# Patient Record
Sex: Female | Born: 1940 | Race: White | Hispanic: No | Marital: Married | Smoking: Former smoker
Health system: Southern US, Community
[De-identification: ages and names within clinical notes are randomized; demographics above are authoritative.]

## PROBLEM LIST (undated history)

## (undated) DIAGNOSIS — C8593 Non-Hodgkin lymphoma, unspecified, intra-abdominal lymph nodes: Secondary | ICD-10-CM

## (undated) DIAGNOSIS — G9340 Encephalopathy, unspecified: Secondary | ICD-10-CM

## (undated) DIAGNOSIS — Z9221 Personal history of antineoplastic chemotherapy: Secondary | ICD-10-CM

## (undated) DIAGNOSIS — E785 Hyperlipidemia, unspecified: Secondary | ICD-10-CM

## (undated) DIAGNOSIS — T148XXA Other injury of unspecified body region, initial encounter: Secondary | ICD-10-CM

## (undated) DIAGNOSIS — J45909 Unspecified asthma, uncomplicated: Secondary | ICD-10-CM

## (undated) DIAGNOSIS — I499 Cardiac arrhythmia, unspecified: Secondary | ICD-10-CM

## (undated) DIAGNOSIS — I10 Essential (primary) hypertension: Secondary | ICD-10-CM

## (undated) DIAGNOSIS — C8330 Diffuse large B-cell lymphoma, unspecified site: Secondary | ICD-10-CM

## (undated) DIAGNOSIS — M109 Gout, unspecified: Secondary | ICD-10-CM

## (undated) DIAGNOSIS — I2510 Atherosclerotic heart disease of native coronary artery without angina pectoris: Secondary | ICD-10-CM

## (undated) DIAGNOSIS — Z923 Personal history of irradiation: Secondary | ICD-10-CM

## (undated) DIAGNOSIS — N390 Urinary tract infection, site not specified: Secondary | ICD-10-CM

## (undated) DIAGNOSIS — A419 Sepsis, unspecified organism: Secondary | ICD-10-CM

## (undated) DIAGNOSIS — E039 Hypothyroidism, unspecified: Secondary | ICD-10-CM

## (undated) DIAGNOSIS — B952 Enterococcus as the cause of diseases classified elsewhere: Secondary | ICD-10-CM

## (undated) DIAGNOSIS — Z950 Presence of cardiac pacemaker: Secondary | ICD-10-CM

## (undated) DIAGNOSIS — M1990 Unspecified osteoarthritis, unspecified site: Secondary | ICD-10-CM

## (undated) DIAGNOSIS — I495 Sick sinus syndrome: Secondary | ICD-10-CM

## (undated) DIAGNOSIS — C801 Malignant (primary) neoplasm, unspecified: Secondary | ICD-10-CM

## (undated) DIAGNOSIS — Z8679 Personal history of other diseases of the circulatory system: Secondary | ICD-10-CM

## (undated) DIAGNOSIS — D61810 Antineoplastic chemotherapy induced pancytopenia: Secondary | ICD-10-CM

## (undated) DIAGNOSIS — Z95828 Presence of other vascular implants and grafts: Secondary | ICD-10-CM

## (undated) DIAGNOSIS — H269 Unspecified cataract: Secondary | ICD-10-CM

## (undated) DIAGNOSIS — N179 Acute kidney failure, unspecified: Secondary | ICD-10-CM

## (undated) DIAGNOSIS — J449 Chronic obstructive pulmonary disease, unspecified: Secondary | ICD-10-CM

## (undated) DIAGNOSIS — D709 Neutropenia, unspecified: Secondary | ICD-10-CM

## (undated) DIAGNOSIS — C8590 Non-Hodgkin lymphoma, unspecified, unspecified site: Secondary | ICD-10-CM

## (undated) HISTORY — DX: Hyperlipidemia, unspecified: E78.5

## (undated) HISTORY — DX: Chronic obstructive pulmonary disease, unspecified: J44.9

## (undated) HISTORY — PX: ABDOMINAL HYSTERECTOMY: SHX81

## (undated) HISTORY — DX: Personal history of irradiation: Z92.3

## (undated) HISTORY — DX: Diffuse large B-cell lymphoma, unspecified site: C83.30

## (undated) HISTORY — DX: Unspecified asthma, uncomplicated: J45.909

## (undated) HISTORY — DX: Essential (primary) hypertension: I10

## (undated) HISTORY — PX: TUBAL LIGATION: SHX77

## (undated) HISTORY — PX: PACEMAKER INSERTION: SHX728

## (undated) HISTORY — DX: Other injury of unspecified body region, initial encounter: T14.8XXA

## (undated) HISTORY — PX: INSERT / REPLACE / REMOVE PACEMAKER: SUR710

## (undated) HISTORY — DX: Atherosclerotic heart disease of native coronary artery without angina pectoris: I25.10

## (undated) HISTORY — DX: Sick sinus syndrome: I49.5

## (undated) HISTORY — PX: BLADDER SUSPENSION: SHX72

## (undated) HISTORY — DX: Gout, unspecified: M10.9

## (undated) HISTORY — DX: Non-hodgkin lymphoma, unspecified, intra-abdominal lymph nodes: C85.93

## (undated) HISTORY — DX: Non-Hodgkin lymphoma, unspecified, unspecified site: C85.90

## (undated) HISTORY — PX: CHOLECYSTECTOMY: SHX55

---

## 1997-08-15 ENCOUNTER — Emergency Department (HOSPITAL_COMMUNITY): Admission: EM | Admit: 1997-08-15 | Discharge: 1997-08-15 | Payer: Self-pay | Admitting: Emergency Medicine

## 1999-02-25 ENCOUNTER — Other Ambulatory Visit: Admission: RE | Admit: 1999-02-25 | Discharge: 1999-02-25 | Payer: Self-pay | Admitting: Obstetrics & Gynecology

## 2001-01-26 ENCOUNTER — Other Ambulatory Visit: Admission: RE | Admit: 2001-01-26 | Discharge: 2001-01-26 | Payer: Self-pay | Admitting: Obstetrics & Gynecology

## 2002-03-20 ENCOUNTER — Other Ambulatory Visit: Admission: RE | Admit: 2002-03-20 | Discharge: 2002-03-20 | Payer: Self-pay | Admitting: Obstetrics & Gynecology

## 2003-08-06 ENCOUNTER — Observation Stay (HOSPITAL_COMMUNITY): Admission: RE | Admit: 2003-08-06 | Discharge: 2003-08-06 | Payer: Self-pay | Admitting: Obstetrics & Gynecology

## 2003-09-21 ENCOUNTER — Encounter (INDEPENDENT_AMBULATORY_CARE_PROVIDER_SITE_OTHER): Payer: Self-pay | Admitting: Specialist

## 2003-09-21 ENCOUNTER — Inpatient Hospital Stay (HOSPITAL_COMMUNITY): Admission: RE | Admit: 2003-09-21 | Discharge: 2003-09-24 | Payer: Self-pay | Admitting: Obstetrics & Gynecology

## 2003-11-15 ENCOUNTER — Encounter: Admission: RE | Admit: 2003-11-15 | Discharge: 2003-11-15 | Payer: Self-pay | Admitting: Endocrinology

## 2003-12-14 ENCOUNTER — Encounter (INDEPENDENT_AMBULATORY_CARE_PROVIDER_SITE_OTHER): Payer: Self-pay | Admitting: *Deleted

## 2003-12-14 ENCOUNTER — Observation Stay (HOSPITAL_COMMUNITY): Admission: RE | Admit: 2003-12-14 | Discharge: 2003-12-15 | Payer: Self-pay

## 2004-05-18 ENCOUNTER — Emergency Department (HOSPITAL_COMMUNITY): Admission: EM | Admit: 2004-05-18 | Discharge: 2004-05-18 | Payer: Self-pay | Admitting: Emergency Medicine

## 2004-08-08 ENCOUNTER — Encounter: Admission: RE | Admit: 2004-08-08 | Discharge: 2004-08-08 | Payer: Self-pay | Admitting: Endocrinology

## 2004-10-26 ENCOUNTER — Emergency Department (HOSPITAL_COMMUNITY): Admission: EM | Admit: 2004-10-26 | Discharge: 2004-10-26 | Payer: Self-pay | Admitting: *Deleted

## 2005-08-12 ENCOUNTER — Ambulatory Visit: Payer: Self-pay | Admitting: Gastroenterology

## 2005-08-19 ENCOUNTER — Ambulatory Visit: Payer: Self-pay | Admitting: Gastroenterology

## 2006-07-29 ENCOUNTER — Ambulatory Visit (HOSPITAL_COMMUNITY): Admission: RE | Admit: 2006-07-29 | Discharge: 2006-07-29 | Payer: Self-pay | Admitting: Internal Medicine

## 2006-07-30 ENCOUNTER — Inpatient Hospital Stay (HOSPITAL_COMMUNITY): Admission: EM | Admit: 2006-07-30 | Discharge: 2006-08-09 | Payer: Self-pay | Admitting: Emergency Medicine

## 2006-08-24 ENCOUNTER — Inpatient Hospital Stay (HOSPITAL_COMMUNITY): Admission: AD | Admit: 2006-08-24 | Discharge: 2006-09-04 | Payer: Self-pay | Admitting: Cardiology

## 2006-08-25 ENCOUNTER — Ambulatory Visit: Payer: Self-pay | Admitting: Internal Medicine

## 2007-01-11 ENCOUNTER — Ambulatory Visit (HOSPITAL_COMMUNITY): Admission: RE | Admit: 2007-01-11 | Discharge: 2007-01-11 | Payer: Self-pay | Admitting: Internal Medicine

## 2007-08-29 ENCOUNTER — Ambulatory Visit (HOSPITAL_COMMUNITY): Admission: RE | Admit: 2007-08-29 | Discharge: 2007-08-29 | Payer: Self-pay | Admitting: Internal Medicine

## 2009-05-02 ENCOUNTER — Encounter: Payer: Self-pay | Admitting: Internal Medicine

## 2009-07-16 ENCOUNTER — Ambulatory Visit (HOSPITAL_COMMUNITY): Admission: RE | Admit: 2009-07-16 | Discharge: 2009-07-16 | Payer: Self-pay | Admitting: Internal Medicine

## 2009-12-10 ENCOUNTER — Encounter: Payer: Self-pay | Admitting: Internal Medicine

## 2010-01-27 ENCOUNTER — Ambulatory Visit: Payer: Self-pay | Admitting: Internal Medicine

## 2010-01-27 DIAGNOSIS — F17200 Nicotine dependence, unspecified, uncomplicated: Secondary | ICD-10-CM

## 2010-01-27 DIAGNOSIS — Z950 Presence of cardiac pacemaker: Secondary | ICD-10-CM

## 2010-01-27 DIAGNOSIS — I4891 Unspecified atrial fibrillation: Secondary | ICD-10-CM

## 2010-01-27 DIAGNOSIS — I10 Essential (primary) hypertension: Secondary | ICD-10-CM | POA: Insufficient documentation

## 2010-01-27 DIAGNOSIS — I495 Sick sinus syndrome: Secondary | ICD-10-CM | POA: Insufficient documentation

## 2010-02-04 ENCOUNTER — Telehealth: Payer: Self-pay | Admitting: Internal Medicine

## 2010-05-12 ENCOUNTER — Encounter: Payer: Self-pay | Admitting: Internal Medicine

## 2010-05-15 NOTE — Assessment & Plan Note (Signed)
Summary: nep/a-fib   Visit Type:  Follow-up Primary Ishmail [name]:  amy stevenson   History of Present Illness: [name] is referred today by Dr. Aleen Campi for ongoing evaluation and treatment of atrial fibrillation, sinus node dysfunction s/p PPM, and dyslipidemia and morbid obesity.  The patients hx dates back to 2008 when she was admitted with bradycardia and underwent PPM insertion.  She ultimately had to have a new device placed several weeks later secondary to pocket infection.  The patient has been stable since.  She has palpitations occaisionally but no syncope.  She is on chronic coumadin for her atrial fib.  She admits to dietary indiscretion and has continued to smoke cigarettes.  Current Medications (verified): 1)  Hydrochlorothiazide 25 Mg Tabs (Hydrochlorothiazide) .... Take One Tablet By Mouth Daily. 2)  Lipitor 20 Mg Tabs (Atorvastatin Calcium) .... Take One Tablet By Mouth Daily. 3)  Warfarin Sodium 6 Mg Tabs (Warfarin Sodium) .... Use As Directed By Anticoagulation Clinic 4)  Diltiazem Hcl Er Beads 180 Mg Xr24h-Cap (Diltiazem Hcl Er Beads) .... Take One Capsule By Mouth Daily 5)  Lisinopril 10 Mg Tabs (Lisinopril) .... Take One Tablet By Mouth Bid 6)  Levothroid 100 Mcg Tabs (Levothyroxine Sodium) .... Once Daily 7)  Citalopram Hydrobromide 20 Mg Tabs (Citalopram Hydrobromide) .... Once Daily 8)  Metformin Hcl 500 Mg Tabs (Metformin Hcl) .... 2 Tablets  Two Times A Day 9)  Magnesium 250 Mg Tabs (Magnesium) .... Once Daily  Allergies (verified): 1)  ! Codeine  Past History:  Past Medical History: Last updated: 2010-02-05 . Sick sinus syndrome.    Hematoma at the site of the pacemaker insertion.    Chronic atrial fibrillation.    Pacemaker lead revision completed August 04, 2006.   Hypotension.   . Dyslipidemia.      Family History: Last updated: 2010-02-05  Mother died at age 59 of a stroke.  Her father died at   age 77, also with a stroke.  She had a sister who  died earlier this year   at age 72.  Her sister had a permanent pacemaker.  She has a son, now 48   years old, had a brain aneurysm at age 2, and after rupture, has had   marked disability sense.   Social History: Last updated: 2010-02-05 The patient has worked two jobs for many years.  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   Review of Systems       All systems reviewed and negative except for bilateral knee pain.  Vital Signs:  Patient profile:   70 year old female Height:      56 inches Weight:      242 pounds BMI:     54.45 Pulse rate:   70 / minute BP sitting:   98 / 70  (left arm)  Vitals Entered By: Laurance Flatten CMA (January 27, 2010 9:27 AM)  Physical Exam  General:  Obese, well developed, well nourished, in no acute distress.  HEENT: normal Neck: supple. No JVD. Carotids 2+ bilaterally no bruits Cor: RRR no rubs, gallops or murmur Lungs: CTA. Well healed PPM incision. Ab: Obese, soft, nontender. nondistended. No HSM. Good bowel sounds Ext: warm. no cyanosis, clubbing or edema Neuro: alert and oriented. Grossly nonfocal. affect pleasant    PPM Specifications Following MD:  Lewayne Bunting, MD     Referring MD:  Charolette Child, MD PPM Vendor:  [facility] Jude     PPM Model Number:  5826     PPM Serial Number:  [id number] PPM DOI:  08/02/2006     PPM Implanting MD:  Charolette Child, MD  Lead 1    Location: RA     DOI: 08/02/2006     Model #: 1642T     Serial #: [id number]     Status: active Lead 2    Location: RV     DOI: 08/02/2006     Model #: 1646T     Serial #: [id number]     Status: active  Magnet Response Rate:  BOL 98.6 ERI 86.3  Indications:  Sick sinus syndrome   PPM Follow Up Remote Check?  No Battery Voltage:  2.79 V     Battery Est. Longevity:  10 years      Pacer Dependent:  No       PPM Device Measurements Atrium  Amplitude: 4.5 mV, Impedance: 378 ohms, Threshold: 0.5 V at 0.4 msec Right Ventricle  Amplitude: 8.8 mV, Impedance: 591 ohms,  Threshold: 0.625 V at 0.4 msec  Episodes MS Episodes:  148     Percent Mode Switch:  14%     Coumadin:  Yes  Parameters Mode:  DDD     Lower Rate Limit:  70     Upper Rate Limit:  110 Paced AV Delay:  250     Sensed AV Delay:  200 Next Cardiology Appt Due:  07/13/2010 Tech Comments:  RA reprogrammed 2.0@0 .4.  148 mode switch episodes the longest >1 day, + coumadin.  There were a small % of elevated ventricular rates during A-fib.  We will start TTM's with Mednet every 6 months and she will return in 6 months to the clinic. Altha Harm, LPN  January 27, 2010 10:05 AM  MD Comments:  Normal device function.  Impression & Recommendations:  Problem # 1:  CARDIAC PACEMAKER IN SITU (ICD-V45.01) Her device is working normally.  Will recheck in several months.  Problem # 2:  ATRIAL FIBRILLATION (ICD-427.31) She is out of rhythm just over 10% of the time but has minimal symptoms.  Continue meds as below and cardizem. Her updated medication list for this problem includes:    Warfarin Sodium 6 Mg Tabs (Warfarin sodium) ..... Use as directed by anticoagulation clinic  Problem # 3:  ESSENTIAL HYPERTENSION, BENIGN (ICD-401.1) Her blood pressure is well controlled.  I discussed the importance of weight loss. Her updated medication list for this problem includes:    Hydrochlorothiazide 25 Mg Tabs (Hydrochlorothiazide) .Marland Kitchen... Take one tablet by mouth daily.    Diltiazem Hcl Er Beads 180 Mg Xr24h-cap (Diltiazem hcl er beads) .Marland Kitchen... Take one capsule by mouth daily    Lisinopril 10 Mg Tabs (Lisinopril) .Marland Kitchen... Take one tablet by mouth bid  Problem # 4:  TOBACCO ABUSE (ICD-305.1) I discussed the importance of smoking cessation.  She states that she will try and stop smoking.  Patient Instructions: 1)  Your physician recommends that you continue on your current medications as directed. Please refer to the Current Medication list given to you today. 2)  Your physician wants you to follow-up in: 6 months  You  will receive a reminder letter in the mail two months in advance. If you don't receive a letter, please call our office to schedule the follow-up appointment.

## 2010-05-15 NOTE — Progress Notes (Signed)
Summary: c/o pain between breast bone  Phone Note Call from Patient Call back at Home Phone [phone]   Caller: Patient Reason for Call: Talk to Nurse Details for Reason: c/o when she take a deep breath, pain between breast bone.  Initial call taken by: Lorne Skeens,  February 04, 2010 12:18 PM  Follow-up for Phone Call        pt was not at home.  she is going to call me back when she returns home Dennis Bast, RN, BSN  February 04, 2010 2:27 PM pt called back and she is fine now.  This happened after she got out of the shower and it would hurt worse with inspiration.  She said it is better now.  she sat down and it all has gone away.  She has been going about her normal activity.  will call back if comes back or go to the ER if necessary Dennis Bast, RN, BSN  February 04, 2010 3:00 PM

## 2010-05-15 NOTE — Cardiovascular Report (Signed)
Summary: Office Visit   Office Visit   Imported By: Roderic Ovens 01/30/2010 13:33:11  _____________________________________________________________________  External Attachment:    Type:   Image     Comment:   External Document

## 2010-05-15 NOTE — Letter (Signed)
Summary: [facility]   Imported By: Marylou Mccoy 02/10/2010 08:15:40  _____________________________________________________________________  External Attachment:    Type:   Image     Comment:   External Document

## 2010-05-15 NOTE — Letter (Signed)
Summary: [facility]  [facility]   Imported By: Marylou Mccoy 02/10/2010 08:43:11  _____________________________________________________________________  External Attachment:    Type:   Image     Comment:   External Document

## 2010-05-21 NOTE — Miscellaneous (Signed)
Summary: corrected device information  Clinical Lists Changes  Observations: Added new observation of PPMLEADSER2: [id number] (05/12/2010 19:31) Added new observation of PPMLEADMOD2: 1788TC (05/12/2010 19:31) Added new observation of PPMLEADDOI2: 09/02/2006 (05/12/2010 19:31) Added new observation of PPMLEADSER1: [id number] (05/12/2010 19:31) Added new observation of PPMLEADMOD1: 1699TC (05/12/2010 19:31) Added new observation of PPMLEADDOI1: 09/02/2006 (05/12/2010 19:31) Added new observation of PPM DOI: 09/02/2006 (05/12/2010 19:31) Added new observation of PPM SERL#: [id number]  (05/12/2010 19:31)      PPM Specifications Following MD:  Lewayne Bunting, MD     Referring MD:  Charolette Child, MD PPM Vendor:  [facility] Jude     PPM Model Number:  5826     PPM Serial Number:  [id number] PPM DOI:  09/02/2006     PPM Implanting MD:  Charolette Child, MD  Lead 1    Location: RA     DOI: 09/02/2006     Model #: [id number]     Serial #: [id number]     Status: active Lead 2    Location: RV     DOI: 09/02/2006     Model #: [id number]     Serial #: [id number]     Status: active  Magnet Response Rate:  BOL 98.6 ERI 86.3  Indications:  Sick sinus syndrome   PPM Follow Up Pacer Dependent:  No      Episodes Coumadin:  Yes  Parameters Mode:  DDD     Lower Rate Limit:  70     Upper Rate Limit:  110 Paced AV Delay:  250     Sensed AV Delay:  200

## 2010-07-30 ENCOUNTER — Encounter: Payer: Self-pay | Admitting: Internal Medicine

## 2010-07-31 ENCOUNTER — Encounter: Payer: Self-pay | Admitting: Internal Medicine

## 2010-08-26 NOTE — Cardiovascular Report (Signed)
[name], [name]               ACCOUNT NO.:  [id number]   MEDICAL RECORD NO.:  [id number]          PATIENT TYPE:  INP   LOCATION:  2807                         FACILITY:  [facility]   PHYSICIAN:  Dana Char, MD    DATE OF BIRTH:  Feb 20, 1941   DATE OF PROCEDURE:  09/02/2006  DATE OF DISCHARGE:                            CARDIAC CATHETERIZATION   NOTE:  Pacemaker note.   PROCEDURE:  Insertion of dual-chamber permanent transvenous pacemaker  DDD mode.   INDICATIONS FOR PROCEDURE:  This 70 year old female was scheduled for  reinsertion of a pacemaker system today by way of her left subclavian  vein.  She has a history of sick sinus syndrome with syncope with  tachybrady syndrome and initially had a pacemaker insertion on  April21,2008.  She had moderate bleeding problems during the  procedure ans problems with hemostasis of the wound and 24 hours later  she had marked swelling of the site and also lead dislodgement.  She  then had a repeat procedure on April23,2008 for pocket revision  and lead repositioning.  There was a marked hematoma within the pocket  at that time along with continued bleeding.  She had successful  repositioning of the leads and cleaning of the wound.  She was given 1  gram of Avitene in the wound and it was felt that her wound was stable  at that time.  She was discharged the following day with the wound  appearing stable.  However over the next week, she had progressive  swelling of the wound and she returned to our office with marked  swelling and showing signs of drainage from the wound.   The wound was cleaned and redressed and plans for pocket revision were  made.  She was scheduled for a repeat procedure and she was admitted on  [id number] for pocket revision.  On admission, the pocket wound  was not healed and there was purulent drainage.  She then underwent a  pacemaker removal procedure on 08/24/2006 with debridement of the wound  with old clot  removed and a thorough debridement of the wound and  cleaning of the edges.  The wound was then closed except for a Penrose  drain and she was continued on Ancef.   The following day we obtained an infectious disease consult and changed  the antibiotic and she has been on IV Kantamycin since that time with  the dosage controlled by the pharmacy.  The working diagnosis from  infectious disease is wound infection and she is now stable for  reinsertion on the opposite side.  Of note is that she did have a marked  brady arrhythmia the day following the explant with a severe brady  arrhythmia in the 20s, associated with near syncope while at rest and a  marked drop in her blood pressure.  This responded to atropine and she  has been monitored since on the intensive care unit.   PROCEDURE:  After signing an informed consent the patient was  premedicated with 5 mg of Valium by mouth and brought to the cardiac  catheterization lab at  [facility].  Her left anterior chest and  base of neck were prepped and draped in a sterile fashion and a left  transverse subclavicular plane was anesthetized locally using 1%  lidocaine with epinephrine.  An incision was made in this anesthetized  plane with the incision being deepened into the fascial layer overlying  the pectoralis muscle.  A pocket was then formed in this fascial layer  using the Bovie for hemostasis and hemostasis at this time was very good  with a combination of using the Bovie to form the pocket and using the  lidocaine with epinephrine.  We then inserted two #7 Cook introducer  sheaths into the left subclavian vein percutaneously and the Seldinger  wires were easily passed into the superior vena cava.  We then selected  two active fixation screw-in leads made by [facility]. [employer].  Primarily  the choice of active fixation lead was because of her lead dislodgement,  following the first procedure.  A ventricular lead was inserted  first.  It is a model tendril model number #1788TC, serial number [id number].  After proper preparation it was inserted through a 7-French Cook  introducer sheath and advanced to the superior vena cava.  The sheath  was removed in the usual fashion.  The atrial lead selected is an  Optisense made by [employer], model number [phone], serial number  [id number].  After proper preparation, it was inserted through the second  [facility] introducer sheath and advanced to the superior vena cava.  The  second sheath was removed in the usual fashion.   The ventricular lead was then advanced into the right atrium and right  ventricle where the tip was positioned in the apex of the right  ventricle.  Very good pacing parameters were obtained with a minimum  voltage threshold of 0.7 volts utilizing 0.9 mA of current.  The  resistance was measured at 925 ohms.  The R wave sensitivity measured  6.0 mV.  These parameters were obtained after actively fixed in the lead  with the screw-in device.  The atrial lead was then advanced into the  right atrium where it was positioned anteriorly, in the right atrial  appendage and after actively affixing the lead, we obtained very good  pacing parameters with a minimal voltage threshold of 1 volts utilizing  3 mA of current.  The resistance was measured at 454 ohms and the P-wave  sensitivity measured 3.2 mV.  After obtaining these pacing parameters,  the leads were secured properly at their insertion site.  The wound was  lavaged profusely with a Kantamycin solution.  We then selected a new  pacemaker pulse generator, made by [facility] model [place], model  number [id number], serial number [id number].  After properly analyzing, the  pulse generator was attached to the pacing electrodes in the usual  fashion.   The pulse generator was then placed within the previously formed pocket and the wound was closed in layers using 2-0 Vicryl.  Final skin closure  was  obtained with a cutaneous layer of Steri-Strips.  Very good  hemostasis was evident.  The patient tolerated the procedure well and no  complications were noted.   MEDICATIONS:  Medications given were Versed 2 mg IV times two, fentanyl  25 mcg IV.   NOTE:  The pacemaker was noted to be functioning normally in the DDD  mode.  Wound care instructions were given.      Dana Char, MD  Electronically Signed     JRT/MEDQ  D:  09/02/2006  T:  09/02/2006  Job:  [id number]

## 2010-08-26 NOTE — Cardiovascular Report (Signed)
[name], [name]               ACCOUNT NO.:  [id number]   MEDICAL RECORD NO.:  [id number]          PATIENT TYPE:  OIB   LOCATION:  2807                         FACILITY:  [facility]   PHYSICIAN:  [name], MD    DATE OF BIRTH:  August 01, 1940   DATE OF PROCEDURE:  08/24/2006  DATE OF DISCHARGE:                            CARDIAC CATHETERIZATION   PROCEDURE:  Pacemaker explantation with removal of pacing leads.   INDICATIONS FOR PROCEDURE:  This 70 year old female had a permanent  transvenous pacemaker inserted initially on August 02, 2006.  She had  marked bleeding in her wound and also had a lead displacement.  The  wound was re-operated on on April 23 with removal of the hematoma and  repositioning of both leads.  She had anticoagulation with Avitene in  the pocket during the procedure and was then discharged home the next  day with the pacing wound appearing to be stable.  However, when she  returned one week later the wound had bled significantly and she had  oozing from the wound. It was cleaned and redressed and after several  more days was apparent that the wound would not heal.  She was then  scheduled for removal of the pacer today.   PROCEDURE:  After signing an informed consent, the patient was  premedicated with 5 mg of Valium by mouth and brought to the cardiac  catheterization laboratory at [facility].  Her anterior chest  and base of neck and wound in the right anterior chest were anesthetized  locally with 1% lidocaine.  The wound was then opened and the pacing  pulse generator was removed from the pocket.  We then loosened the  pacing electrodes from their insertion site and they were easily  retracted and removed from their insertion site.  The wound was then  debrided and lavaged profusely with kanamycin solution and the edges of  the wound were trimmed.  We then left a Penrose placed in situ with the  end being exposed in the lateral margin of the  wound.  The wound was  then closed in layers using 2-0 Vicryl.  Final skin closure was obtained  with a cutaneous layer of Steri-Strips.  The patient tolerated the  procedure well and no complications were noted.  At the end of the  procedure a sterile bulky dressing was applied to the wound and she was  admitted to the hospital for further IV antibiotics and monitoring.   MEDICATIONS GIVEN DURING THE PROCEDURE:  Versed 2 mg IV twice.   Also of note that she had gone into atrial fibrillation after the first  procedure and we had scheduled her for elective cardioversion today  also.  However, last night she converted spontaneously to normal sinus  rhythm.  Therefore, the cardioversion was cancelled.   Plans at this point will be to continue the Ancef antibiotic  intravenously 1 g q.8h. until cultures and sensitivity are obtained from  the wound.  They were sent to pathology during the procedure.  We will  keep her off of Coumadin at  present  because of her conversion back to sinus rhythm, and also because  of her marked propensity to bleed.  Other plans at present are to give  her a least 1 week of antibiotic before planning to reinsert a new  pacing system on the left side.      [name], MD  Electronically Signed     JRT/MEDQ  D:  08/24/2006  T:  08/24/2006  Job:  [id number]   cc:   Cath Lab at [facility]

## 2010-08-26 NOTE — H&P (Signed)
NAMEKEYSHIA, ORWICK               ACCOUNT NO.:  [id number]   MEDICAL RECORD NO.:  [id number]          PATIENT TYPE:  OIB   LOCATION:  2807                         FACILITY:  [facility]   PHYSICIAN:  [name], MD    DATE OF BIRTH:  July 20, 1940   DATE OF ADMISSION:  08/24/2006  DATE OF DISCHARGE:                              HISTORY & PHYSICAL   REASON FOR ADMISSION:  This 70 year old female is being admitted after  removal of her pacemaker.  She initially presented to the hospital in  mid April with weakness, chest pain, diaphoresis and monitoring At that  time documented symptomatic bradycardia and short episodes of atrial  fibrillation.  She had a pacemaker insertion initially, with a dual-  chamber pacemaker on August 02, 2006, without problems.  However,  approximately 24 hours after the insertion, the patient was quite active  and telemetry showed malfunction of the pacer and chest x-ray documented  dislodgement of the pacing lead.  Also, she was showing marked swelling  of her pacemaker wound site.  She then had a repeat procedure on August 04, 2006, for pocket revision and lead repositioning.  There was a  marked hematoma within the pocket at that time, and there was continued  bleeding.  She had successful repositioning of the leads and cleaning of  the wound.  It was then felt to be stable after having wound  anticoagulation with 1 gram of Avitene, and the wound was sutured  routinely, and she was stable for discharge the following day with the  wound appearing to be stable.  However, after one week when she returned  to our office for follow up, the wound was markedly swollen and showed  signs of drainage from the wound.  The wound was cleaned and redressed,  and plans for pacer pocket revision were made.  She was scheduled for  that today.  Meanwhile, the drainage continued and with when seen  yesterday, there was obvious drainage from the wound, and we proceeded  with  scheduling for the pacemaker removal rather than pocket revision.  Also, she had gone into atrial fibrillation which persisted, and plans  were made for cardioversion today.   PAST MEDICAL HISTORY:  The patient has a history of intermittent atrial  fibrillation and was previously on placed on Coumadin.  She has a rather  negative medical history except for limited mobility due to a bad left  knee and has not been able to increase her activity level.  She has two  children alive and well, and has had one operation in the past having a  C-section with her second child.  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   FAMILY HISTORY:  Mother died at age 1 of a stroke.  Her father died at  age 81, also with a stroke.  She had a sister who died earlier this year  at age 53.  Her sister had a permanent pacemaker.  She has a son, now 41  years old, had a brain aneurysm at age 69, and after rupture, has  had  marked disability sense.   SOCIAL HISTORY:  The patient has worked two jobs for many years.  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   REVIEW OF SYSTEMS:  The patient smoked until 2004.  She has occasional  leg cramps.  She is followed by nose or sinus problems.  She has carpal  tunnel syndrome with numbness in her hands.   PHYSICAL EXAMINATION:  GENERAL;  The patient is overweight in  appearance.  VITAL SIGNS:  Blood pressure 110/70, pulse 60 and regular, respirations  nonlabored.  She was in no acute distress on admission.  She was well  oriented x3.  HEENT:  Ear, nose and throat clear.  External exam is normal.  NECK:  Supple.  Normal carotid pulses.  No jugular venous distension.  Thyroid not palpable.  CHEST:  Respirations are nonlabored.  Lungs were clear without rales,  rhonchi or wheezes.  CARDIOVASCULAR:  Regular rhythm.  Normal first and second heart sounds.  No murmur, gallop or click.  ABDOMEN:  Rotund, normal bowel sounds.  Nontender.   There is no  guarding.  EXTREMITIES:  Full range of motion, normal distal peripheral pulses.  SKIN:  Warm and dry.  There was no edema present.   IMPRESSION:  1. On admission, pacemaker wound infection secondary to a wound      hematoma and nonhealing.  2. Tachybrady syndrome.   DISPOSITION:  Pacemaker removal was scheduled for today with the patient  now in sinus rhythm.  Will not continue the Coumadin, and will cancel  the cardioversion.      [name], MD  Electronically Signed     JRT/MEDQ  D:  08/24/2006  T:  08/24/2006  Job:  [id number]

## 2010-08-29 ENCOUNTER — Ambulatory Visit (INDEPENDENT_AMBULATORY_CARE_PROVIDER_SITE_OTHER): Payer: Medicare Other | Admitting: Internal Medicine

## 2010-08-29 ENCOUNTER — Encounter: Payer: Self-pay | Admitting: Internal Medicine

## 2010-08-29 DIAGNOSIS — I10 Essential (primary) hypertension: Secondary | ICD-10-CM

## 2010-08-29 DIAGNOSIS — Z950 Presence of cardiac pacemaker: Secondary | ICD-10-CM

## 2010-08-29 DIAGNOSIS — I495 Sick sinus syndrome: Secondary | ICD-10-CM

## 2010-08-29 DIAGNOSIS — I4891 Unspecified atrial fibrillation: Secondary | ICD-10-CM

## 2010-08-29 NOTE — Discharge Summary (Signed)
NAME:  JOSSLIN, SANJUAN                   ACCOUNT NO.:  [id number]   MEDICAL RECORD NO.:  [id number]                   PATIENT TYPE:  INP   LOCATION:  9319                                 FACILITY:  [facility]   PHYSICIAN:  Ilda Mori, M.D.                DATE OF BIRTH:  30-Dec-1940   DATE OF ADMISSION:  09/21/2003  DATE OF DISCHARGE:  09/24/2003                                 DISCHARGE SUMMARY   FINAL DIAGNOSIS:  Pelvic prolapse.   SECONDARY DIAGNOSES:  1. Adult onset gestational diabetes mellitus.  2. Hypothyroidism.  3. Atrial fibrillation.  4. Mild chronic obstructive pulmonary disease.   PROCEDURES:  Transvaginal hysterectomy.  Anterior and posterior repair.   COMPLICATIONS:  None.   CONDITION ON DISCHARGE:  Stable.   HISTORY:  This is a 70 year old, gravida 2, para 2, with a long history of  pelvic relaxation.  The patient failed pessary for correction and requested  surgical repair.  The patient developed new onset atrial fibrillation  preoperatively on August 06, 2003, and her surgery was postponed at that  time.  She was evaluated by Dr. Aggie Cosier of cardiology, where she was  found to be back in regular sinus rhythm, and she seemed to have no  significant cardiac insufficiency.  It was therefore felt that she was  medically stable for surgery.   HOSPITAL COURSE:  The surgery was rescheduled, and she was admitted to the  hospital on September 21, 2003, where she was taken to the operating room and a  transvaginal hysterectomy was performed, with an anterior and posterior  repair, and colpoplasty for vaginal suspension was performed.  The patient  did well postoperatively.  Her Foley catheter was removed on the morning of  the first postoperative day.  Later in that day, she developed a temperature  to 101.4, and was started on IV antibiotics.  In addition, it was noted that  her voiding, which had been normal earlier in the day, was becoming more  frequent with  smaller amounts, and a urinary cath revealed 700 cc of  residual; therefore, the decision was made to place the Foley and rest the  bladder.  On the morning of the second postoperative day, her pulse, which  had been regular at 60-70 beats per minute, was found to be irregular in the  90-100 range.  A cardiogram was obtained, which confirmed that the patient  had lapsed once again into atrial fibrillation.  Cardiac consultation was  called, and the decision was made to increase her Cardizem, and to start her  on Coumadin.  On the morning of the third postoperative day, the patient had  remained afebrile 24 hours.  Her blood pressure was normal, and the patient  was ambulating well.  Her catheter was removed on the morning of the third  postoperative day, and on the afternoon of that day, she had been voiding  well.  At this time, she has  been reevaluated by cardiology, it was felt  that outpatient management was appropriate, and she was felt to be ready for  discharge from both a cardiac and a gynecologic perspective.   DIET:  She was discharged on a regular diet.   ACTIVITY:  She was told to limit her activities.   DISCHARGE MEDICATIONS:  1. She was given Coumadin 5 mg to take daily in the evening.  2. She was to continue her Synthroid 112 mcg daily.  3. She was also given an increased dose of Cardizem to 300 mg daily.   FOLLOW UP:  1. She will see her cardiologist in 1 week.  2. She will return for GYN postoperative evaluation in 2 weeks.   LABORATORY DATA:  A CK total of 340, CK-MB of 3.3, a relative index of 1.0.  Her troponin I was 0.02.  Glycosylated hemoglobin, hemoglobin A1C was 6.2.  A TSH was 0.109.  Electrolytes were normal, except for a glucose of 129.  An  INR was 1.1 on the morning after Coumadin had been started.  Her admission  hemoglobin was 14.5, with a white count of 8100.  Her discharge hemoglobin  was 11.6 with a white count of 9200.  Her urinalysis was negative  for  infection.  A chest x-ray was performed because of a cough, which showed  stable mild cardiomegaly.  EKG was obtained during her hospitalization,  which showed atrial fibrillation.  The pathology report is pending at the  time of this dictation.                                               Ilda Mori, M.D.    RK/MEDQ  D:  09/24/2003  T:  09/24/2003  Job:  [id number]   cc:   Jaclyn Prime. Lucas Mallow, M.D.  [address]  [place]  [place] [address]  Fax: [phone]   Brooke Bonito, M.D.  [address]  [place]  [place] [address]  Fax: [phone]

## 2010-08-29 NOTE — Discharge Summary (Signed)
[name], [name]               ACCOUNT NO.:  [id number]   MEDICAL RECORD NO.:  [id number]          PATIENT TYPE:  INP   LOCATION:  6526                         FACILITY:  [facility]   PHYSICIAN:  [name], MD    DATE OF BIRTH:  [birth year]   DATE OF ADMISSION:  08/24/2006  DATE OF DISCHARGE:  09/04/2006                               DISCHARGE SUMMARY   FINAL DIAGNOSES:  1. 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  2. Tachybrady syndrome.   OPERATIONS:  1. Pacemaker pulse generator and lead removal - Aug 24, 2006.  2. Insertion of new pacemaker pulse generator, dual-chamber - Sep 02, 2006.   INDICATION FOR ADMISSION:  This 70 year old female was readmitted for  removal of her pacemaker and pacemaker leads after having pacer  insertion on August 02, 2006, which had a late complication of pocket  hematoma and nonhealing.  This was revised initially on April 23 and  again had pacemaker pocket hematoma.  This showed signs of nonhealing a  week later and she was readmitted for removal and treatment of the  secondary pocket infection.   HOSPITAL COURSE:  On the day of admission, the patient's wound in her  right upper chest was opened and the pacemaker removed and the leads  were withdrawn from her right ventricle and right atrium.  A Penrose  drain was left in place.  The wound was debrided and flushed with  kanamycin solution.  The wound edges were trimmed and re-closed with the  drain in place.  And infectious disease consult was obtained by Dr.  Orvan Falconer and he recommended antibiotic treatments, which she received  until May 24.  The cultures from the wound were negative, and after 10  days of IV antibiotics she then had a new insertion of a permanent  transvenous pacemaker in the left anterior chest by way of the left  subclavian vein.  Hemostasis was easily obtained on this insertion and  there was no sign of hematoma or wound swelling or  discoloration  following this surgery.  The IV antibiotics were then continued for two  further days and being stable for discharge on Sep 04, 2006, she was  then discharged in improved stable condition with a new pacemaker in  place.   DISPOSITION ON DISCHARGE:  1. Wound care:  Wound care instructions were given to keep dry for 1      week.  2. Followup appointment:  For 1 week in the office to check her wound      and her pacemaker.  3. Diet:  Low sodium heart healthy.  4. Activity:  Increase activity slowly, may shower and bathe after 1      week.  No heavy lifting for 1 week.  No driving for 1 week.   MEDICATIONS:  1. Synthroid 0.100 daily.  2. Lisinopril 10 mg daily.  3. Cardizem LA 240 mg daily.  4. Coumadin 2.5 mg daily.  5. Lexapro 20 mg daily.  6. Lipitor 40 mg one-half tablet q.h.s.  7. Hydrocodone 5/500  p.r.n.  pain.   SPECIAL INSTRUCTIONS:  She was instructed to call my office for a  followup appointment and also for followup of her prothrombin time for  her Coumadin management.  Her Coumadin was restarted after the new  insertion of the pacemaker.   CONDITION ON DISCHARGE:  Improved and stable.      [name], MD  Electronically Signed     JRT/MEDQ  D:  09/30/2006  T:  09/30/2006  Job:  [id number]

## 2010-08-29 NOTE — Cardiovascular Report (Signed)
[name], [name]               ACCOUNT NO.:  [id number]   MEDICAL RECORD NO.:  [id number]          PATIENT TYPE:  INP   LOCATION:  3734                         FACILITY:  [facility]   PHYSICIAN:  [name], MD    DATE OF BIRTH:  June 21, 1940   DATE OF PROCEDURE:  08/04/2006  DATE OF DISCHARGE:                            CARDIAC CATHETERIZATION   SURGEON:  [name], MD   PROCEDURE:  1. Pacemaker lead repositioning.   INDICATIONS FOR PROCEDURE:  This 70 year old female had insertion of a  dual chamber permanent transvenous pacemaker on August 02, 2006 for  tachybrady syndrome and was scheduled for discharge yesterday.  Her  pacemaker was analyzed yesterday morning and was found to have normal  function with very good pacing parameters.  However, just prior to  discharge, the telemetry showed malfunction with atrial spikes in  noncapture and patient was noted to have moderate hyperactivity with  extending her arm over her head multiple times and a repeat chest x-ray  showed malposition of the atrial lead in the superior vena cava.  This  was changed from the chest x-ray done the night before.  She was then  scheduled for repositioning of the opposing lead today.   PROCEDURE IN DETAIL:  After signing an informed consent, the patient was  premedicated with 5 mg of Valium by mouth and brought to the cardiac  catheterization lab at [facility].  Her right anterior chest,  face and neck were prepped and draped in a sterile fashion and the wound  was anesthetized locally with 1% lidocaine.  The wound was reopened with  incision of the Vicryl sutures exposing the pulse generator which was  removed from the pocket.  The pulse generator was removed from the  leads.  The pacemaker was also noted to now be in atrial fibrillation.  We then analyzed both leads and found that repositioning of the  ventricular lead was also noted.  We repositioned the ventricular lead  first and  obtained very good pacing parameters with a minimum bolus  threshold of 0.4 volts, a resistance of 848 Ohms and an R wave  sensitivity of 10.9 millivolts.  We then repositioned the atrial lead  and obtained a site which the tip was caught and could not be dislodged  easily at this area.  We found P waves of 1.8 millivolts, a resistance  of 538 Ohms and we were unable to get a threshold because of the atrial  fibrillation.  After repositioning the leads, they were secured properly  at their insertion site using 2-0 silk sutures.  We then lavaged the  wound profusely with a kanamycin solution.  There was significant oozing  of the wound deep in the pocket and Avitene 1 gram was used to insert in  the pocket for hemostasis.  We then inserted the leads within the pulse  generator in the usual fashion and then pulse generator was returned to  the pocket.  The wound was closed in layers using 2-0 Vicryl.  Final  skin closure was obtained with a cutaneous layer of  Steri-Strips.  The  patient tolerated the procedure well and no complications were noted.  At the end of the procedure, a sterile bulky dressing was applied to the  wound and she was returned to her room in satisfactory condition.   MEDICATIONS GIVEN:  Versed 2 mg IV x2, Avitene 1 gram in the pacer  pocket.   The pacemaker is noted to be functioning normally in the DDD mode.  After review of the chart, we restarted her home medication of Cardizem  LA 240 mg daily and we will plan to stop the IV Cardizem which was  started at 3:30 this morning.  We will also plan to cardiovert when her  Coumadin level is therapeutic if she does not spontaneously cardiovert  before then.      [name], MD  Electronically Signed     JRT/MEDQ  D:  08/04/2006  T:  08/04/2006  Job:  [id number]   cc:   [facility]

## 2010-08-29 NOTE — Op Note (Signed)
NAME:  [name], [name]                   ACCOUNT NO.:  [id number]   MEDICAL RECORD NO.:  [id number]                   PATIENT TYPE:  INP   LOCATION:  9319                                 FACILITY:  [facility]   PHYSICIAN:  Ilda Mori, M.D.                DATE OF BIRTH:  1940/06/21   DATE OF PROCEDURE:  09/21/2003  DATE OF DISCHARGE:                                 OPERATIVE REPORT   PREOPERATIVE DIAGNOSIS:  Cervical and uterine prolapse with 2-3 degree  cystocele and rectocele.   POSTOPERATIVE DIAGNOSIS:  Cervical and uterine prolapse with 2-3 degree  cystocele and rectocele.   PROCEDURE:  Transvaginal hysterectomy, anterior and posterior repair.   SURGEON:  Ilda Mori, M.D.   ASSISTANT:  Randye Lobo, M.D.   ANESTHESIA:  General endotracheal anesthesia.   ESTIMATED BLOOD LOSS:  200 mL.   COMPLICATIONS:  None.   FINDINGS:  Fourth degree uterocervical prolapse and a third degree cystocele  and rectocele.  The ovaries could not be palpated or visualized.   INDICATIONS FOR PROCEDURE:  This is a 70 year old gravida 2, para 2, who  presented with marked pelvic prolapse.  The patient has lived with these  symptoms for over five years but they became more distressing to her and she  requested surgical repair.  A pessary was placed and this could not be  retained.  In addition, the patient was not anxious to use a pessary and  wanted surgical repair.   DESCRIPTION OF PROCEDURE:  The patient was taken to the operating room and  placed in the dorsal lithotomy position  so that she was comfortable prior  to anesthesia.  After she was placed in a comfortable position, general  endotracheal anesthesia was induced.  The vagina, perineum, and lower  abdomen were prepped and draped in a sterile fashion.  The cervix, which was  protruding through the introitus, was grasped with a tenaculum and the  paracervical tissues were infiltrated with a dilute epinephrine solution of  0.5%  Marcaine.  The vagina was circumcised and the vaginal mucosa was  dissected free.  The posterior cul-de-sac was entered sharply and the  uterosacral ligaments were clamped, cut, ligated, and held.  The base of the  cardinal ligaments were then grasped with a LigaSure clamp and cauterized.  The anterior cul-de-sac was then entered and the remainder of the cardinal  ligaments and the uterine arteries were clamped with a LigaSure clamp and  cauterized.  With traction on the cervix, the base of the broad ligament was  clamped, cauterized, and cut, and this dissection continued until the utero-  ovarian anastomosis was identified, clamped, cut, and ligated.  Hemostasis  was noted to be present.  The infundibulopelvic ligament could not be  identified.  The ovaries could not be identified.  The decision was made not  to proceed with bilateral oophorectomy.  After removal of the uterus and  cervix, the posterior cuff was  closed with a running interlocking #1 Vicryl  suture.  The cul-de-sac was then closed with a suture from uterosacral to  uterosacral and another culdoplasty stitch was made through the posterior  vagina coming out through the cul-de-sac and picking up the uterosacrals  laterally and back through the posterior vagina.  Neither of these stitches  were tied at this time.  The peritoneum was not closed.  The vagina was then  grasped anteriorly.  The anterior vaginal wall was incised and the bladder  was dissected free from the overlying vaginal mucosa.  The cystocele was  reduced and Kelly plications were performed to repair the cystocele.  The  vaginal mucosa was then closed with horizontal mattress sutures.  The  posterior repair was then performed.  The peritoneum was incised in a V-  shape at the introitus and a flap of skin was removed.  The vaginal mucosa  was then dissected free from the underlying rectocele.  With a finger in the  rectum to guide the surgeon, the lateral  portions of the rectocele were  defined and the muscular structures laterally were sutured across the  midline to repair the rectocele.  Following this, the vaginal mucosa was  trimmed free and was closed with horizontal mattress sutures.  At this  point, the cul-de-sac sutures were tied and this created good support for  the vaginal cuff.  The vagina was packed with gauze and Estrace cream.  The  bladder was catheterized with a Foley catheter and clear urine was obtained.  The procedure was terminated and the patient left the operating room in good  condition.                                               Ilda Mori, M.D.    RK/MEDQ  D:  09/21/2003  T:  09/21/2003  Job:  [id number]

## 2010-08-29 NOTE — H&P (Signed)
NAME:  [name], [name]                   ACCOUNT NO.:  [id number]   MEDICAL RECORD NO.:  [id number]                   PATIENT TYPE:  INP   LOCATION:  NA                                   FACILITY[facility]   PHYSICIAN:  Ilda Mori, M.D.                DATE OF BIRTH:  [birth year]   DATE OF ADMISSION:  09/20/2004  DATE OF DISCHARGE:                                HISTORY & PHYSICAL   CHIEF COMPLAINT:  Complete pelvic prolapse.   HISTORY OF PRESENT ILLNESS:  This is a 70 year old gravida 2, para 2, who  has been followed in the office for 10 years with pelvic relaxation.  The  patient's problems have been fairly minimal, despite the significant degree  of pelvic relaxation.  She denies any urinary incontinence and otherwise  functions normally; however, over the past several years, the prolapse has  become more pronounced with the cervix actually protruding through the  vaginal introitus.  In addition, the patient has developed some benign  microhematuria which has been evaluated and found to be totally benign;  however, she feels that it is possible that her prolapse is causing damage  to her kidneys and therefore, this has added a sense of urgency to her  concerns about getting this repaired.   PAST MEDICAL HISTORY:  1. She has a history of hypothyroidism for which she is currently on 112 mcg     of Synthroid replacement.  2. She has osteoarthritis which affects her hip, knee and shoulder joints.  3. She also has a history of gallstones.  4. She suffers from mild cardiomegaly and on August 06, 2003, when she was     scheduled for her surgery previously, atrial fibrillation was diagnosed.     Since that time, she has had an evaluation with Dr. Jaclyn Prime. Lucas Mallow and     she has been started on Cardizem LA 180 mg daily prior to that visit.     When she did see Dr. Lucas Mallow, she had regular sinus rhythm.  He did a     complete physical exam on her which showed no signs of significant  cardiac disease and she was in sinus rhythm at that time.  He felt that     her atrial fibrillation preoperatively in April may have been due to the     slightly higher dose of Synthroid (125 mcg) that she was taking at that     time, due to the beneficial effect of the Cardizem which she been started     on since the episode or simply a spontaneously recover.  5. The patient also has adult-onset diabetes mellitus for which she is on no     medication.  6. She has hyperlipidemia.  7. Mild anxiety.  8. In addition, she has a history of asthmatic bronchitis and mild COPD.   PREVIOUS SURGERIES:  Previous surgeries include:  1. A cesarean section.  2. Tubal  ligation.   MEDICATIONS:  Her medications are:  1. Synthroid 0.112 mg daily.  2. Lisinopril 10 mg.  3. Etodolac 400 mg daily.  4. Lorazepam 1 mg daily.  5. Cardizem LA 180 mg daily.   FAMILY HISTORY:  Family history of heart disease and high blood pressure.  Her mother and father both suffered from strokes.   ALLERGIES:  She claims to have a CODEINE allergy but the symptoms of  exposure are nausea.   SOCIAL HISTORY:  She does not smoke and does not abuse alcohol.   REVIEW OF SYSTEMS:  Review of systems is negative in detail.   PHYSICAL EXAM:  GENERAL:  She is 4-feet 11-inches tall, 242 pounds with a  calculated body mass index of 49.  VITAL SIGNS:  She is afebrile, blood pressure 130/80.  ENT:  Ears, nose and throat are normal.  NECK:  Thyroid gland was not palpably enlarged, although she does have a  history of a thyroid nodule.  LUNGS:  Her lungs were clear to auscultation and percussion.  HEART:  Heart was regular sinus rhythm without murmurs or gallops.  ABDOMEN:  Abdomen was obese without hepatosplenomegaly.  BREASTS:  Her breasts were without masses.  PELVIC:  Her pelvic exam revealed normal external genitalia, her cervix  protruding through the introitus.  She did not have a marked rectocele,  which appeared only 2+,  but she had a dramatic cystocele which was 3 to 4+.  Most of her defect appeared to be central at the site of the uterus and  cervix.   HOSPITAL IMPRESSION:  The patient has mildly symptomatic marked pelvic  relaxation.  The patient was tried and failed on a pessary which would not  stay in place.  She is also not anxious to pursue pessary as a long-term  treatment for this problem.  She has no signs of bladder dysfunction at this  time.  An operative plan is to do a vaginal hysterectomy and remove both  ovaries if possible; in addition, an anterior and posterior repair and  enterocele repair will be performed at the time of surgery.                                               Ilda Mori, M.D.    RK/MEDQ  D:  09/20/2003  T:  09/20/2003  Job:  [id number]

## 2010-08-29 NOTE — Discharge Summary (Signed)
NAMEMCKENZEY, [name]               ACCOUNT NO.:  [id number]   MEDICAL RECORD NO.:  [id number]          PATIENT TYPE:  INP   LOCATION:  3734                         FACILITY:  [facility]   PHYSICIAN:  Hillery Aldo, M.D.   DATE OF BIRTH:  [birth year]   DATE OF ADMISSION:  07/30/2006  DATE OF DISCHARGE:  08/03/2006                               DISCHARGE SUMMARY   PRIMARY CARE PHYSICIAN:  Is Dr. [name].   CARDIOLOGIST:  Is Dr. Aggie Cosier   DISCHARGE DIAGNOSES:  1. Sick sinus syndrome status post pacemaker implantation, [employer]      Medical device.  2. Chronic atrial fibrillation.  3. Hypotension with bradycardia.  4. Hypothyroidism.  5. Chronic anxiety disorder.  6. Diet-controlled diabetes.  7. Dyslipidemia  8. Ongoing tobacco abuse.   DISCHARGE MEDICATIONS:  1. Lovenox 110 mg subcutaneous injection b.i.d. until INR therapeutic.  2. Coumadin 5 mg daily.  3. Levothyroxine 100 mcg daily.  4. Lexapro 20 mg daily.  5. Lisinopril 10 mg daily.   Note, the patient's Cardizem and bisoprolol has been discontinued.  Consideration for restarting either of these medications can be made by  the patient's primary care physician or cardiologist as indicated.   CONSULTATIONS:  1. Dr. Lucas Mallow and Dr. Aleen Campi of cardiology.   PROCEDURES AND DIAGNOSTIC STUDIES.:  1. Chest X-Ray: On 07/30/2006 showed borderline heart size with no      acute cardiopulmonary disease.  2. Implantation of a permanent pacemaker on 08/02/2006.   DISCHARGE LABORATORY VALUES:  PT was 14.7, INR 1.1.  Sodium was 141,  potassium 3.8, chloride 106, bicarb 28, BUN 16, creatinine 0.88, glucose  117, free T4 was 1.14.  White blood cell count was 7.8, hemoglobin 14.4,  hematocrit 42.7, platelets 295.   HOSPITAL COURSE BY PROBLEM:  1. Sick sinus syndrome:  The patient was admitted with bradycardia and      hypotension.  Concern was that the patient had developed sick sinus      syndrome and her cardiologist was  consulted.  The patient was      evaluated by her cardiologist and plans were made to implant a      permanent pacemaker which was done as noted above.  The patient has      been asymptomatic throughout the course of her hospitalization and      her blood pressure has been stable.  She has not had any further      bradycardia off her usual dose of Cardizem and beta blocker      therapy.  At this point, her Cardizem and beta-blocker have been      completely discontinued and will not be restarted prior to      discharge due to soft blood pressure readings.  Her lisinopril was      restarted.  Consideration for re-adding these medications can be      done as an outpatient if her blood pressure can tolerate them.  2. Chronic atrial fibrillation:  The patient has a history of chronic      atrial fibrillation on Coumadin therapy.  Coumadin was held      secondary to plans for permanent pacemaker implantation.  She was      put on therapeutic dose Lovenox prior to her surgery.  Therapeutic      low dose Lovenox was restarted after pacemaker implantation and she      will be discharged on both this and Coumadin.  A follow-up      appointment has been set up for her to have her PT and INR checked      on Friday at Dr. Pollie Friar office.  He can then determine if she can      discontinue Lovenox. .  3. Hypothyroidism:  The patient's TSH was checked during the course of      her hospitalization and found to be slightly low.  Free T4 was      subsequently checked and found to be within normal limits.  No      further dosage adjustment was made to her Synthroid dose given her      normal free T4.  4. Anxiety disorder:  The patient continued on her usual dose of      Lexapro  5. Diabetes:  The patient's glycemic control was excellent on a      carbohydrate modified diet.  A hemoglobin A1c was checked and found      to be 7.1%.  .  6. Dyslipidemia:  The patient was continued on a low cholesterol diet.       Her total cholesterol was 107, triglycerides 150, HDL 30, and LDL      47.  7. Tobacco abuse:  The patient was strongly counseled in regard to      cessation of tobacco products.  She was maintained on a nicotine      patch to help craving.  She underwent formal tobacco cessation      counseling prior to discharge.   DISPOSITION:  The patient is stable for discharge home today.  She  should follow up for a repeat of a PT/INR on 08/06/2006 at Dr. Pollie Friar  office.  She should follow up with her primary care physician, Dr.  Elisabeth Most in 1-2 weeks.      Hillery Aldo, M.D.  Electronically Signed     CR/MEDQ  D:  08/03/2006  T:  08/03/2006  Job:  [id number]   cc:   Lovenia Kim, D.O.  Jaclyn Prime. Lucas Mallow, M.D.

## 2010-08-29 NOTE — Cardiovascular Report (Signed)
NAMELINDY, [name]               ACCOUNT NO.:  [id number]   MEDICAL RECORD NO.:  [id number]          PATIENT TYPE:  INP   LOCATION:  3734                         FACILITY:  [facility]   PHYSICIAN:  [name], MD    DATE OF BIRTH:  [birth year]   DATE OF PROCEDURE:  08/02/2006  DATE OF DISCHARGE:                            CARDIAC CATHETERIZATION   PROCEDURE:  Insertion of dual-chamber permanent transvenous pacemaker  DDD mode.   INDICATIONS FOR PROCEDURE:  A 70 year old female with syncope and  collapse and a Holter monitor documenting symptomatic bradyarrhythmia  and hospital-documented weakness with bradycardia and hypotension. She  also has had intermittent atrial fibrillation; therefore, fits the  criteria for tachybrady syndrome.   ANESTHESIA:  Local with 1% lidocaine.   PROCEDURE:  After signing an informed consent, the patient was  premedicated with 5 mg of Valium by mouth and brought to the cardiac  catheterization lab at [facility]. Her right anterior chest and  base of neck were prepped and draped in sterile fashion, and a right  transverse subclavicular plane was anesthetized locally with 1%  lidocaine.  An incision was made in this anesthetized plane with the  incision being deepened into the fascial layer overlying the pectoralis  muscle.  A pocket was then formed in this fascial layer for later  insertion of the pulse generator.  Two #7 Cook introducer sheaths were  inserted percutaneously into the right subclavian vein with the  Seldinger wires being easily passed into the superior vena cava. The  first lead selected was made by [facility]. [employer], a bipolar ventricular  lead, Model Number 1646 T, Serial Number [id number]. After proper  preparation, it was inserted through the [facility] introducer sheath and  advanced to the superior vena cava.  The sheath was removed in the usual  fashion.  A second lead was then selected for the atrium, a bipolar  atrial J  lead made by [employer], Model Number [id number], Serial  Number [id number]. After proper preparation, the atrial J lead was  inserted through the second [facility] introducer sheath and advanced to the  superior vena cava.  This sheath was removed in the usual fashion.  The  ventricular lead was then advanced into the right atrium and right  ventricle where the tip was positioned in the apex of the right  ventricle. Very good pacing parameters were obtained with an R wave  sensitivity of 10-12 mV, a resistance of 815 ohms, and a minimum voltage  threshold of 0.2 volts utilizing 0.2 mA of current.  After obtaining the  ventricular pacing thresholds, the atrial J lead was advanced into the  right atrium where the tip was positioned anteriorly in the right atrial  appendage.  Very good pacing parameters were obtained with a P-wave  sensitivity of 7.0 mV, a resistance of 664 ohms, and a minimal voltage  threshold of 0.5 volts, utilizing 0.7 mA of current. After obtaining  these pacing parameters, both leads were secured properly at their  insertion site.  The wound was lavaged profusely with kanamycin  solution.   We then selected a pulse generator made by [employer], Primera  model, Model Number [id number], Serial Number [id number].  After properly  analyzing the pulse generator, it was attached to both electrodes in the  usual fashion.  The pulse generator was then placed within the  previously formed pocket, and the wound was closed in layers using 2-0  Vicryl.  Final skin closure was obtained with a cutaneous layer of Steri-  Strips.  The patient tolerated the procedure well, and no complications  were noted.  At the end of the procedure, a sterile bulky dressing was  applied to the wound, and she was returned to her room in satisfactory  condition.   MEDICATIONS GIVEN:  Versed 2 mg IV for conscious sedation.   The pacemaker was noted to have normal sensing capture with normal  function in  the DDD mode.      [name], MD  Electronically Signed     JRT/MEDQ  D:  08/02/2006  T:  08/02/2006  Job:  [id number]   cc:   Catheterization Lab, [facility]

## 2010-08-29 NOTE — Patient Instructions (Signed)
Your physician wants you to follow-up in: 6 months in the device clinic and 12 months with Dr Taylor You will receive a reminder letter in the mail two months in advance. If you don't receive a letter, please call our office to schedule the follow-up appointment.  

## 2010-08-29 NOTE — Discharge Summary (Signed)
[name], [name]               ACCOUNT NO.:  [id number]   MEDICAL RECORD NO.:  [id number]          PATIENT TYPE:  INP   LOCATION:  3734                         FACILITY:  [facility]   PHYSICIAN:  Michaelyn Barter, M.D. DATE OF BIRTH:  11/25/1940   DATE OF ADMISSION:  07/30/2006  DATE OF DISCHARGE:  08/09/2006                               DISCHARGE SUMMARY   FINAL DIAGNOSES:  1. Sick sinus syndrome.  2. Hematoma at the site of the pacemaker insertion.  3. Chronic atrial fibrillation.  4. Pacemaker lead revision completed August 04, 2006.  5. Hypotension.  6. Dyslipidemia.   CONSULTATION:  Cardiology with Dr. Aleen Campi.   PROCEDURES:  1. His pacemaker lead repositioning completed August 04, 2006.  2. Insertion of dual chamber permanent transvenous pacemaker, DDD mode      completed August 02, 2006, by Dr. Aram Candela. Tysinger  3. Chest x-ray completed July 30, 2006, August 03, 2006, and August 05, 2006.   HISTORY OF PRESENT ILLNESS:  [name] is a 70 year old female who  stated that she attempted to ambulate to her bedroom and while she  attempted to stand, she developed sudden onset of dizziness accompanied  by severe nausea and left-sided chest pain.  She went her kitchen and  began to vomit.  She decided to come to the hospital for further  evaluation.  She stated that she had similar symptoms in the past  attributed to her history of arrhythmia.   PAST MEDICAL HISTORY:  Please see that dictated by Dr. Hillery Aldo.   HOSPITAL COURSE:  PROBLEM #1 - SICK SINUS SYNDROME:  While in the  emergency department, the patient's heart rate was noted to be 42.  There was a right bundle branch block noted on her EKG as well as some  nonspecific T-wave that abnormalities.  A chest x-ray was done on July 30, 2006, that revealed borderline heart size.  No acute cardiopulmonary  disease..  Cardiology was consulted and Dr. Aleen Campi performed the  insertion of a dual-chamber permanent  transvenous pacemaker, DDD mode,  on August 02, 2006.  The patient had no repeat episodes of syncope,  presyncope or orthostatic hypotension or sick sinus related symptoms  during the remaining portion of her hospitalization.   PROBLEM #2 - PACEMAKER LEAD REVISION:  On August 01, 2006, the patient  was noted to have had a 5.4 second pause, then she converted to normal  sinus rhythm, then she went back to atrial fibrillation.  External pacer  pads had to be placed during that time.  On August 03, 2006, plans were  made to discharge the patient from the hospital, however, she started  having strange hiccups.  The telemetry monitor revealed abnormal pacer  spikes.  A chest x-ray was completed on August 03, 2006, which revealed  the right subclavian dual lead pacemaker had been placed with leads in  the right atrium and right ventricle.  The lungs appear to be clear.  There was no heart failure.  A repeat chest x-ray revealed interval  dislodgement of the atrial lead  now folded on one end within the SVC.  On August 04, 2006, Dr. Aleen Campi performed a pacemaker lead revision  during which time he repositioned both the A and V leads.  It was also  noted at that time that the patient had converted back to atrial  fibrillation.   PROBLEM #3 - ATRIAL FIBRILLATION:  The patient does have a history of  chronic atrial fibrillation and it appears that during the course of her  hospitalization she had episodes whereby she would convert from normal  sinus rhythm to atrial fibrillation.  Cardiology continued to follow  this.   PROBLEM #4 - HYPOTENSION:  During the earlier portion of the patient's  hospitalization her blood pressure was noted to be slightly hypotensive,  however, by the last remaining days of her hospitalization this  condition resolved.   PROBLEM #5 - DYSLIPIDEMIA:  This was monitored over the course of her  hospitalization.   PROBLEM #6 -  HEMATOMA AT THE PACEMAKER INSERTION SITE:   Following the  patient's pacemaker insertion, she was noted to have developed a  significant amount of swelling over the area of her pacemaker.  Initially she complained of some pain, however, she had no fevers, her  white count remained relatively within normal limits.  The patient was  scheduled to be discharged from the hospital earlier, however, the  decision was made to keep the patient in the hospital for another day or  two just to continue to monitor the swelling.  Swelling remained  relatively stable and the patient's pain decreased.  Therefore the  decision was made to discharge the patient from the hospital.   On the day of discharge the patient's temperature was 98.2, heart rate  72, respirations 20, blood pressure 106/66.  Her white blood cell count  was 7.8, hemoglobin 11.9, hematocrit 34.7, platelets 272.  PT 24.4, INR  2.1.  Sodium 138, potassium 3.9, chloride 101, CO2 31, BUN 15,  creatinine 0.82 and glucose 113, calcium 8.8, INR 2.1.   DISCHARGE MEDICATIONS:  The patient was discharged home on  1. Coumadin 5 mg p.o. daily.  2. Levothyroxine 1 mcg daily.  3. Lexapro 20 mg daily.  4. Lisinopril 10 mg daily.  5. Diltiazem 240 mg daily.   She was told to go to Dr. Pollie Friar office Friday to have her PT and INR  checked.      Michaelyn Barter, M.D.  Electronically Signed     OR/MEDQ  D:  09/23/2006  T:  09/23/2006  Job:  [id number]

## 2010-08-29 NOTE — H&P (Signed)
[name], [name]               ACCOUNT NO.:  [id number]   MEDICAL RECORD NO.:  [id number]          PATIENT TYPE:  INP   LOCATION:  3734                         FACILITY:  [facility]   PHYSICIAN:  Hillery Aldo, M.D.   DATE OF BIRTH:  07-Jun-1940   DATE OF ADMISSION:  07/30/2006  DATE OF DISCHARGE:                              HISTORY & PHYSICAL   PRIMARY CARE PHYSICIAN:  Dr. [name].  Cardiologist, Dr. Aggie Cosier.   CHIEF COMPLAINT:  Sudden onset of nausea, vomiting, diaphoresis,  shortness of breath and chest pain.   HISTORY OF PRESENT ILLNESS:  The patient is a 70 year old female who  states that she got up to ambulate to her bedroom and while arising,  developed the sudden onset of dizziness accompanied by severe nausea and  left-sided chest pain.  She also endorsed shortness of breath, but  states she is short of breath at baseline with any activity.  The  patient subsequently went to the kitchen and vomited several times.  She  is unsure if she lost consciousness during this time as she sat down  because of the nausea.  She called a friend who subsequently encourage  her to come to the emergency department for evaluation.  She is  currently chest pain free and cannot tell me how long she had the chest  pain for, but described it as left-sided and upper abdominal.  She  states she has had similar episodes of this constellation of symptoms in  the past and it has been related to her arrhythmia.  She has a known  history of atrial fibrillation and recently had seen Dr. Lucas Mallow and had a  medication adjustment.  She had been taking Cardizem 300 mg.  This was  reduced to 240 mg and bisoprolol 2.5 mg was added.  Nevertheless, she is  found to be significantly hypotensive and bradycardiac on evaluation  here in the emergency department.  She is therefore being admitted for  further evaluation and workup.   PAST MEDICAL HISTORY:  1. Osteoarthritis.  2. Hyperlipidemia.  3.  History of anxiety disorder.  4. Hypothyroidism.  5. Atrial fibrillation.  6. Mild chronic obstructive pulmonary disease/bronchial asthma.  7. History of diabetes, currently diet-controlled.  8. History of cystocele and rectocele.  9. Status post laparoscopic cholecystectomy in September 2005.  10.Status post transvaginal hysterectomy with anterior and posterior      repair secondary to pelvic relaxation in June 2005.  11.History of cesarean section.  12.History of tubal ligation.   FAMILY HISTORY:  The patient's mother died at 49 from complications of a  stroke.  She also had hypertension.  The patient's father died at age 88  from an acute myocardial infarction several days after having a stroke.  The patient has six sisters, three of whom are deceased.  One died of  kidney disease and coronary artery disease.  One died of ischemic bowel.  The other died in childbirth.  She has two brothers, one brother is  deceased secondary to gangrene from cholecystitis at a young age.  The  other brother is  alive and has suffered from recurrent bouts of  pneumonia.   SOCIAL HISTORY:  The patient is married and lives with her husband and  handicapped son.  She smokes a pack of tobacco daily.  She denies  alcohol use.  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   ALLERGIES:  CODEINE causes nausea.   CURRENT MEDICATIONS:  1. Cardizem ER 240 mg daily.  2. Lexapro 20 mg daily.  3. Coumadin 5 mg daily or as directed.  4. Bisoprolol 2.5 mg daily.  5. Lisinopril 10 mg daily.  6. Levothyroxine 100 mcg daily.   REVIEW OF SYSTEMS:  CONSTITUTIONAL:  The patient denies any fever or  chills.  There has not been any changes in her appetite.  CARDIOPULMONARY:  She has chronic longstanding dyspnea on exertion and a  chronic cough.  GI:  No changes in bowel habits, melena or hematochezia.  GU:  No dysuria.  MUSCULOSKELETAL:  She  has chronic musculoskeletal  pain, mainly in her knees from osteoarthritis.   PHYSICAL EXAMINATION:  VITAL SIGNS:  Temperature 98, pulse 42,  respirations 16, blood pressure 93/69.  GENERAL:  Obese female in no acute distress.  HEENT:  Normocephalic, atraumatic.  PERRL.  EOMI.  Oropharynx is clear.  NECK:  Supple, no thyromegaly, no lymphadenopathy, no jugular venous  distension.  CHEST:  Lungs clear to auscultation bilaterally with good air movement.  HEART:  Bradycardic rate, slightly irregular.  ABDOMEN:  Soft, nontender, nondistended with normoactive bowel sounds.  EXTREMITIES:  Trace bilateral edema.  SKIN:  Warm and dry.  No rashes.  NEUROLOGIC:  The patient is alert and oriented x3.  Cranial nerves 2-12  grossly intact.  Nonfocal.   LABORATORY DATA AND X-RAY FINDINGS:  A 12-lead EKG shows marked sinus  bradycardia at 41 beats per minute.  There is a right bundle branch  block.  There is some nonspecific T-wave abnormalities mainly in the  anterior leads.  Chest x-ray shows borderline heart size and is negative  for an acute process.   PT is 32, INR 2.9.  D-dimer is less than 0.22.  Sodium is 139, potassium  4.3, chloride 109, bicarb 22.4, BUN 13, creatinine 1.2, glucose 189.  Point of care cardiac markers are negative x2 sets.   ASSESSMENT/PLAN:  1. Bradycardia and hypotension.  This may be secondary to her      medication adjustment or alternatively may be due to underlying      sick sinus syndrome.  Given that her cardiology records are      unavailable, I will ask her cardiologist or a covering physician to      evaluate her to see if sick sinus syndrome is indeed a possibility      and to evaluate her for consideration of a pacemaker implantation.      Given her symptoms, will also rule out acute myocardial infarction      with serial cardiac enzymes and 12-lead EKG testing.  Monitor      closely on telemetry and hold her Cardizem as well as her     bisoprolol and  lisinopril given her relative hypotension.  Her      symptoms have resolved and we will continue with judicious      intravenous fluids until her blood pressure stabilizes.  Will also      check her TSH level to ensure that she is not hypothyroid as a  possible explanation for her symptoms.  There is no evidence that      she has had a pulmonary embolism given her normal D-dimer and low      pretest probability.  2. Hypothyroidism.  Will check the patient's TSH and continue her on      her usual dose of Levothyroxine for now.  3. Chronic atrial fibrillation.  Will continue the patient's Coumadin      and again hold her rate-controlling medications due to relative      bradycardia.  4. Anxiety disorder.  Will continue the patient's Lexapro at 20 mg      daily.  5. Diabetes.  The patient's blood glucose is fairly elevated.  We will      put her on sliding scale insulin and monitor her sugars closely.      If indicated, would consider starting her on oral hypoglycemic      medications.  6. Tobacco abuse.  Will initiate tobacco cessation counseling and      using nicotine patch if needed for craving control.  7. Prophylaxis.  Put the patient on proton pump inhibitor for      gastrointestinal prophylaxis and she is on Coumadin at a      therapeutic dose which should prevent any problems with deep venous      thrombosis.      Hillery Aldo, M.D.  Electronically Signed     CR/MEDQ  D:  07/30/2006  T:  07/31/2006  Job:  [id number]   cc:   Lovenia Kim, D.O.  Jaclyn Prime. Lucas Mallow, M.D.

## 2010-08-29 NOTE — Op Note (Signed)
NAME:  [name], [name]                         ACCOUNT NO.:  [id number]   MEDICAL RECORD NO.:  [id number]                   PATIENT TYPE:  OBV   LOCATION:  0278                                 FACILITY:  [facility]   PHYSICIAN:  Lorre Munroe., M.D.            DATE OF BIRTH:  03-28-1941   DATE OF PROCEDURE:  12/14/2003  DATE OF DISCHARGE:                                 OPERATIVE REPORT   PREOPERATIVE DIAGNOSES:  Symptomatic gallstones.   POSTOPERATIVE DIAGNOSES:  Symptomatic gallstones.   OPERATION:  Laparoscopic cholecystectomy.   SURGEON:  Lebron Conners, M.D.   ASSISTANT:  Leonie Man, M.D.   ANESTHESIA:  General.   DESCRIPTION OF PROCEDURE:  After the patient was monitored and anesthetized  and had routine preparation and draping of the abdomen, I anesthetized the  area just below the umbilicus with local anesthetic and subsequently three  other spots for port placement.  I made a short vertical incision at the  upper end of the lower midline incision and dissected down through the fat  to the fascia which I incised in the midline and elevated with Kocher clamps  and bluntly entered the abdominal cavity. There was some adhesions of the  omentum to the area of the umbilicus just above the incision but I had free  access to the right side of the abdomen. I placed a #0 Vicryl pursestring  suture in the fascia and secured a Hasson cannula and inflated the abdomen  with CO2.  I then placed three additional ports under direct vision.  With  the patient positioned head up, foot down and tilted to the left, I  retracted the fundus of the gallbladder toward the right shoulder and  dissected the area of the infundibulum of the gallbladder retracting it  laterally.  I dissected out the cystic duct and placed a clip as it emerged  from the infundibulum. I then made a small cut distal to that and inserted  the cholangiogram catheter for an operative cholangiogram.  I then performed  a fluoroscopic cholangiogram and noted normal biliary anatomy.  However, the  gallbladder was pacified and I could see stones in it which made me wonder  if there was an accessory bile duct from the gallbladder directly into the  liver.  I removed the cholangiogram catheter and clipped the cystic duct  distally with three clips and divided it.  I dissected out, clipped and  divided the cystic artery and then dissected up the surface of the  gallbladder next to the liver very carefully clipping any structures which  seemed to have any substance at all. I did not find any definite accessory  bile ducts. There was slight bile stain in the gallbladder fossa and I could  not tell if that came from the gallbladder or from the liver.  After  detaching the gallbladder from the liver, I carefully irrigated the area and  saw no  further bile leaking.  However, because of concern about the  possibility of accessory duct, I placed a drain through and through and  brought it out through a lateral incision. I placed the gallbladder in a  plastic pouch and removed it  from the umbilical incision and tied the pursestring suture.  Hemostasis was  good.  I removed the other lateral port under direct vision and then allowed  the CO2 to escape and removed the epigastric port. I closed all skin  incisions with intracuticular 4-0 Vicryl and Steri-Strips. She tolerated the  operation well.                                               Lorre Munroe., M.D.    Jodi Marble  D:  12/14/2003  T:  12/15/2003  Job:  [id number]   cc:   Brooke Bonito, M.D.  [address]  [place]  [place] [address]  Fax: [phone]

## 2010-08-29 NOTE — H&P (Signed)
NAME:  [name], [name]                   ACCOUNT NO.:  [id number]   MEDICAL RECORD NO.:  [id number]                   PATIENT TYPE:  INP   LOCATION:  NA                                   FACILITY:  [facility]   PHYSICIAN:  Ilda Mori, M.D.                DATE OF BIRTH:  08-31-1940   DATE OF ADMISSION:  DATE OF DISCHARGE:                                HISTORY & PHYSICAL   DATE OF SCHEDULED ADMISSION:  August 06, 2003   CHIEF COMPLAINT:  Complete pelvic prolapse.   This is a 70 year old gravida 2 para 2 who has been followed in the office  for 10 years with pelvic relaxation.  The patient's problems have been  fairly minimal despite the significant degree of pelvic relaxation.  She  denies any urinary incontinence and otherwise functions normally.  However,  over the last several years the prolapse has become more pronounced with the  cervix actually protruding through the vaginal introitus.  In addition, the  patient has developed some benign microhematuria which has been evaluated  and found to be totally benign.  However, she feels that it is possible that  her prolapse is causing damage to her kidneys and therefore has added a  sense of urgency to her concerns about getting this repaired.   MEDICAL HISTORY:  Reveals that she has a history of hypothyroidism which has  been treated with Synthroid and the history of small thyroid nodular goiter.  She has osteoarthritis which affects her hip and shoulder joints.  She has  had gallstones.  She suffers from mild cardiomegaly.  A cardiac evaluation  by her medical doctors have shown no signs of congestive heart failure or  cardiac malfunction.  She has developed adult-onset diabetes mellitus for  which she is on no medications.  She has hyperlipidemia and mild anxiety.  In addition she has a history of asthmatic bronchitis and mild COPD.   PREVIOUS SURGERIES:  Include a C-section and a tubal ligation.   MEDICATIONS:  1. Synthroid  0.125 mg.  2. Lisinopril 10 mg.  3. Etodolac 400 mg daily.  4. Lorazepam 1 mg daily.   FAMILY HISTORY:  Reveals father had heart disease.  Her mother and sisters  had high blood pressure.  Her mother suffered from a stroke.  She has  diabetes on the father's side of her family.   ALLERGIES:  She claims to have a CODEINE allergy.   SOCIAL HISTORY:  She does not smoke, does not abuse alcohol.   REVIEW OF SYSTEMS:  Negative.   PHYSICAL EXAMINATION:  VITAL SIGNS:  She is 4 feet 11 inches tall, 242  pounds, with a calculated body mass index of 49.  She is afebrile, blood  pressure 130/80.  HEENT:  Her ears, nose, and throat are normal.  Her thyroid gland was not  palpably enlarged to my exam.  LUNGS:  Clear to auscultation and percussion.  HEART:  Regular  sinus rhythm without murmurs or gallops.  ABDOMEN:  Obese, no hepatosplenomegaly was noted.  BREASTS:  Without masses.  PELVIC:  Revealed normal external genitalia.  Her cervix was protruding  through the introitus.  She did not have a marked rectocele, appeared to be  only 2+, but she had a dramatic cystocele which was 3-4+.  Most of her  defect appeared to be central at the site of the uterus and cervix.   IMPRESSION:  This patient has mildly symptomatic marked pelvic relaxation.  The patient refuses to consider nonsurgical treatment such as a pessary.  She has no signs of bladder dysfunction at this time.  The operative plan is  to do a vaginal hysterectomy.  If the ovaries are easily accessible they  will be removed as well.  If not, they will be left in situ.  An anterior  and posterior repair and enterocele repair will be performed at the time of  surgery.                                               Ilda Mori, M.D.    RK/MEDQ  D:  08/01/2003  T:  08/01/2003  Job:  [id number]

## 2010-08-30 ENCOUNTER — Encounter: Payer: Self-pay | Admitting: Internal Medicine

## 2010-08-30 NOTE — Assessment & Plan Note (Signed)
Her device is working normally. Will recheck in several months. 

## 2010-08-30 NOTE — Assessment & Plan Note (Signed)
Her symptoms are well controlled. Will continue current meds.

## 2010-08-30 NOTE — Progress Notes (Signed)
HPI [name] returns today for followup. She is a pleasant, morbidly obese woman with a h/o bradycardia and PAF, s/p PPM. She has tried to lose weight but has struggled with this. She is very sedentary. She denies c/p. She has mild peripheral edema. She has been maintained on a strategy of rate control. No other complaints today. Allergies  Allergen Reactions  . Codeine      Current Outpatient Prescriptions  Medication Sig Dispense Refill  . atorvastatin (LIPITOR) 20 MG tablet Take 20 mg by mouth daily.        . citalopram (CELEXA) 20 MG tablet Take 20 mg by mouth daily.        Marland Kitchen diltiazem (CARDIZEM CD) 180 MG 24 hr capsule Take 180 mg by mouth daily.        Marland Kitchen levothyroxine (SYNTHROID, LEVOTHROID) 100 MCG tablet Take 100 mcg by mouth daily.        Marland Kitchen lisinopril (PRINIVIL,ZESTRIL) 10 MG tablet Take 10 mg by mouth 2 (two) times daily.       . Magnesium 250 MG TABS Take 1 tablet by mouth daily.        . metFORMIN (GLUCOPHAGE) 500 MG tablet Take 1,000 mg by mouth. 2 in the morning and 1 tablet at night      . warfarin (COUMADIN) 6 MG tablet Take 6 mg by mouth as directed.           Past Medical History  Diagnosis Date  . Sick sinus syndrome   . Hematoma     At the site of the pacemaker insertion.  . Hypotension   . Dyslipidemia     ROS:   All systems reviewed and negative except as noted in the HPI.   Past Surgical History  Procedure Date  . Pacemaker insertion     Lead revision completed August 04, 2006     Family History  Problem Relation Age of Onset  . Stroke Mother   . Stroke Father   . Aneurysm Son     Brain     History   Social History  . Marital Status: Married    Spouse Name: N/A    Number of Children: N/A  . Years of Education: N/A   Occupational History  . [employer]   . Filling Designer, multimedia    Social History Main Topics  . Smoking status: Current Everyday Smoker -- 1.5 packs/day for 30 years    Types: Cigarettes  . Smokeless tobacco:  Never Used  . Alcohol Use: No  . Drug Use: No  . Sexually Active: Not on file   Other Topics Concern  . Not on file   Social History Narrative  . No narrative on file     BP 110/60  Pulse 62  Ht 4\' 10"  (1.473 m)  Wt 238 lb 6.4 oz (108.138 kg)  BMI 49.83 kg/m2  Physical Exam:  Well appearing NAD HEENT: Unremarkable Neck:  No JVD, no thyromegally Lymphatics:  No adenopathy Back:  No CVA tenderness Lungs:  Clear. Well healed PPM incision. HEART:  Regular rate rhythm, no murmurs, no rubs, no clicks Abd:  Flat, positive bowel sounds, no organomegally, no rebound, no guarding Ext:  2 plus pulses, no edema, no cyanosis, no clubbing Skin:  No rashes no nodules Neuro:  CN II through XII intact, motor grossly intact DEVICE  Normal device function.  See PaceArt for details.   Assess/Plan:

## 2010-08-30 NOTE — Assessment & Plan Note (Signed)
Her blood pressure is well controlled. She will continue her current meds, maintain a low sodium diet and try to lose weight.

## 2010-09-19 ENCOUNTER — Emergency Department (HOSPITAL_COMMUNITY): Payer: Medicare Other

## 2010-09-19 ENCOUNTER — Emergency Department (HOSPITAL_COMMUNITY)
Admission: EM | Admit: 2010-09-19 | Discharge: 2010-09-20 | Disposition: A | Discharge status: Home / Self Care | Payer: Medicare Other | Attending: Emergency Medicine | Admitting: Emergency Medicine

## 2010-09-19 ENCOUNTER — Encounter (HOSPITAL_COMMUNITY): Payer: Self-pay | Admitting: Radiology

## 2010-09-19 DIAGNOSIS — R0602 Shortness of breath: Secondary | ICD-10-CM | POA: Insufficient documentation

## 2010-09-19 DIAGNOSIS — R05 Cough: Secondary | ICD-10-CM | POA: Insufficient documentation

## 2010-09-19 DIAGNOSIS — R059 Cough, unspecified: Secondary | ICD-10-CM | POA: Insufficient documentation

## 2010-09-19 DIAGNOSIS — R1909 Other intra-abdominal and pelvic swelling, mass and lump: Secondary | ICD-10-CM | POA: Insufficient documentation

## 2010-09-19 DIAGNOSIS — I4891 Unspecified atrial fibrillation: Secondary | ICD-10-CM | POA: Insufficient documentation

## 2010-09-19 DIAGNOSIS — R1013 Epigastric pain: Secondary | ICD-10-CM | POA: Insufficient documentation

## 2010-09-19 DIAGNOSIS — Z7901 Long term (current) use of anticoagulants: Secondary | ICD-10-CM | POA: Insufficient documentation

## 2010-09-19 DIAGNOSIS — Z79899 Other long term (current) drug therapy: Secondary | ICD-10-CM | POA: Insufficient documentation

## 2010-09-19 HISTORY — DX: Presence of cardiac pacemaker: Z95.0

## 2010-09-19 LAB — COMPREHENSIVE METABOLIC PANEL
ALT: 23 U/L (ref 0–35)
AST: 15 U/L (ref 0–37)
Albumin: 3.4 g/dL — ABNORMAL LOW (ref 3.5–5.2)
Alkaline Phosphatase: 64 U/L (ref 39–117)
GFR calc Af Amer: >60 mL/min (ref >60)
Glucose, Bld: 136 mg/dL — ABNORMAL HIGH (ref 70–99)
Potassium: 4.1 mEq/L (ref 3.5–5.1)
Sodium: 139 mEq/L (ref 135–145)
Total Protein: 6.9 g/dL (ref 6.0–8.3)

## 2010-09-19 LAB — DIFFERENTIAL
Basophils Absolute: 0.1 10*3/uL (ref 0.0–0.1)
Basophils Relative: 1 % (ref 0–1)
Eosinophils Absolute: 0.2 10*3/uL (ref 0.0–0.7)
Monocytes Absolute: 1.1 10*3/uL — ABNORMAL HIGH (ref 0.1–1.0)
Neutro Abs: 6.3 10*3/uL (ref 1.7–7.7)
Neutrophils Relative %: 62 % (ref 43–77)

## 2010-09-19 LAB — CBC
Hemoglobin: 13 g/dL (ref 12.0–15.0)
MCHC: 32.9 g/dL (ref 30.0–36.0)
Platelets: 324 10*3/uL (ref 150–400)
RBC: 4.53 MIL/uL (ref 3.87–5.11)

## 2010-09-19 LAB — URINE MICROSCOPIC-ADD ON

## 2010-09-19 LAB — URINALYSIS, ROUTINE W REFLEX MICROSCOPIC
Bilirubin Urine: NEGATIVE
Glucose, UA: NEGATIVE mg/dL
Protein, ur: NEGATIVE mg/dL
Specific Gravity, Urine: 1.016 (ref 1.005–1.030)
Urobilinogen, UA: 1 mg/dL (ref 0.0–1.0)
pH: 6 (ref 5.0–8.0)

## 2010-09-19 MED ORDER — IOHEXOL 300 MG/ML  SOLN
100.0000 mL | Freq: Once | INTRAMUSCULAR | Status: AC | PRN
  Administered 2010-09-19: 100 mL via INTRAVENOUS

## 2010-09-21 LAB — URINE CULTURE: Culture  Setup Time: 201206090158

## 2010-09-30 ENCOUNTER — Telehealth: Payer: Self-pay | Admitting: Internal Medicine

## 2010-09-30 ENCOUNTER — Other Ambulatory Visit (INDEPENDENT_AMBULATORY_CARE_PROVIDER_SITE_OTHER): Payer: Self-pay | Admitting: General Surgery

## 2010-09-30 DIAGNOSIS — R1900 Intra-abdominal and pelvic swelling, mass and lump, unspecified site: Secondary | ICD-10-CM

## 2010-09-30 NOTE — Telephone Encounter (Signed)
Pt needs to be off of her coumadin by June 21th pt procedure is June 26,2012. Bernie from [facility] would like to talk to a nurse.

## 2010-10-01 NOTE — Telephone Encounter (Signed)
BERNIE STATES SHE NEEDS AN ANSWER BY TODAY RE PT MEDS.

## 2010-10-01 NOTE — Telephone Encounter (Signed)
[name] STATES SHE NEEDS TO HAVE ANSWER RIGHT AWAY IF NOT PT PROCEDURE WILL NEED TO BE CANCEL. [name] STATES SHE HAS CALLED SEVERAL TIMES AND NO ONE HAS ANSWER HER CALL. SHE NEEDS TO SPEAK WITH A NURSE ASAP.

## 2010-10-01 NOTE — Telephone Encounter (Signed)
Left message for Cyndra Numbers that form was faxed -- ok to hold Coumadin

## 2010-10-07 ENCOUNTER — Other Ambulatory Visit: Payer: Self-pay | Admitting: Diagnostic Radiology

## 2010-10-07 ENCOUNTER — Ambulatory Visit (HOSPITAL_COMMUNITY)
Admission: RE | Admit: 2010-10-07 | Discharge: 2010-10-07 | Disposition: A | Discharge status: Home / Self Care | Payer: Medicare Other | Source: Ambulatory Visit | Attending: General Surgery | Admitting: General Surgery

## 2010-10-07 DIAGNOSIS — R1909 Other intra-abdominal and pelvic swelling, mass and lump: Secondary | ICD-10-CM | POA: Insufficient documentation

## 2010-10-07 DIAGNOSIS — R1900 Intra-abdominal and pelvic swelling, mass and lump, unspecified site: Secondary | ICD-10-CM

## 2010-10-07 DIAGNOSIS — Z01812 Encounter for preprocedural laboratory examination: Secondary | ICD-10-CM | POA: Insufficient documentation

## 2010-10-07 LAB — PROTIME-INR: Prothrombin Time: 13.7 seconds (ref 11.6–15.2)

## 2010-10-07 LAB — CBC
MCH: 28.2 pg (ref 26.0–34.0)
MCHC: 32.9 g/dL (ref 30.0–36.0)
MCV: 85.8 fL (ref 78.0–100.0)
Platelets: 362 10*3/uL (ref 150–400)
RDW: 13.7 % (ref 11.5–15.5)

## 2010-10-10 ENCOUNTER — Telehealth (INDEPENDENT_AMBULATORY_CARE_PROVIDER_SITE_OTHER): Payer: Self-pay | Admitting: General Surgery

## 2010-10-12 DIAGNOSIS — C801 Malignant (primary) neoplasm, unspecified: Secondary | ICD-10-CM

## 2010-10-12 HISTORY — DX: Malignant (primary) neoplasm, unspecified: C80.1

## 2010-10-13 ENCOUNTER — Telehealth (INDEPENDENT_AMBULATORY_CARE_PROVIDER_SITE_OTHER): Payer: Self-pay

## 2010-10-21 ENCOUNTER — Telehealth (INDEPENDENT_AMBULATORY_CARE_PROVIDER_SITE_OTHER): Payer: Self-pay | Admitting: General Surgery

## 2010-10-21 ENCOUNTER — Encounter: Payer: Self-pay | Admitting: Internal Medicine

## 2010-10-21 ENCOUNTER — Ambulatory Visit
Admission: RE | Admit: 2010-10-21 | Discharge: 2010-10-21 | Disposition: A | Discharge status: Home / Self Care | Payer: Medicare Other | Source: Ambulatory Visit | Attending: Internal Medicine | Admitting: Internal Medicine

## 2010-10-21 ENCOUNTER — Other Ambulatory Visit: Payer: Self-pay | Admitting: Internal Medicine

## 2010-10-21 DIAGNOSIS — R1900 Intra-abdominal and pelvic swelling, mass and lump, unspecified site: Secondary | ICD-10-CM

## 2010-10-21 DIAGNOSIS — R05 Cough: Secondary | ICD-10-CM

## 2010-10-21 NOTE — Telephone Encounter (Signed)
I discussed biopsy results with Ms Balbach.  This is worrisome for lymphoma, but the amount of tissue available is inadequate.  I discussed with Dr. Lowella Dandy and Dr. Colonel Bald.  Both felt that another percutaneous attempt was reasonable to procure a dx.  If this is lymphoma, the patient would likely not need surgical therapy.  The pt is amenable.

## 2010-10-22 ENCOUNTER — Telehealth (INDEPENDENT_AMBULATORY_CARE_PROVIDER_SITE_OTHER): Payer: Self-pay | Admitting: General Surgery

## 2010-10-23 ENCOUNTER — Telehealth (INDEPENDENT_AMBULATORY_CARE_PROVIDER_SITE_OTHER): Payer: Self-pay

## 2010-10-23 NOTE — Telephone Encounter (Signed)
LM with caregiver for pt to stop coumadin 4 days prior to her upcoming bx.

## 2010-11-03 ENCOUNTER — Other Ambulatory Visit (INDEPENDENT_AMBULATORY_CARE_PROVIDER_SITE_OTHER): Payer: Self-pay | Admitting: General Surgery

## 2010-11-03 ENCOUNTER — Ambulatory Visit (HOSPITAL_COMMUNITY)
Admission: RE | Admit: 2010-11-03 | Discharge: 2010-11-03 | Disposition: A | Discharge status: Home / Self Care | Payer: Medicare Other | Source: Ambulatory Visit | Attending: General Surgery | Admitting: General Surgery

## 2010-11-03 ENCOUNTER — Other Ambulatory Visit: Payer: Self-pay | Admitting: Interventional Radiology

## 2010-11-03 DIAGNOSIS — R1900 Intra-abdominal and pelvic swelling, mass and lump, unspecified site: Secondary | ICD-10-CM

## 2010-11-03 DIAGNOSIS — Z01812 Encounter for preprocedural laboratory examination: Secondary | ICD-10-CM | POA: Insufficient documentation

## 2010-11-03 DIAGNOSIS — Z7901 Long term (current) use of anticoagulants: Secondary | ICD-10-CM | POA: Insufficient documentation

## 2010-11-03 DIAGNOSIS — Z79899 Other long term (current) drug therapy: Secondary | ICD-10-CM | POA: Insufficient documentation

## 2010-11-03 DIAGNOSIS — I10 Essential (primary) hypertension: Secondary | ICD-10-CM | POA: Insufficient documentation

## 2010-11-03 DIAGNOSIS — C8589 Other specified types of non-Hodgkin lymphoma, extranodal and solid organ sites: Secondary | ICD-10-CM | POA: Insufficient documentation

## 2010-11-03 DIAGNOSIS — I4891 Unspecified atrial fibrillation: Secondary | ICD-10-CM | POA: Insufficient documentation

## 2010-11-03 DIAGNOSIS — E119 Type 2 diabetes mellitus without complications: Secondary | ICD-10-CM | POA: Insufficient documentation

## 2010-11-03 HISTORY — PX: BIOPSY STOMACH: PRO33

## 2010-11-03 LAB — CBC
HCT: 39.2 % (ref 36.0–46.0)
Hemoglobin: 13 g/dL (ref 12.0–15.0)
MCH: 28.4 pg (ref 26.0–34.0)
MCHC: 33.2 g/dL (ref 30.0–36.0)
RBC: 4.57 MIL/uL (ref 3.87–5.11)

## 2010-11-03 LAB — PROTIME-INR: Prothrombin Time: 19.1 seconds — ABNORMAL HIGH (ref 11.6–15.2)

## 2010-11-03 LAB — GLUCOSE, CAPILLARY: Glucose-Capillary: 113 mg/dL — ABNORMAL HIGH (ref 70–99)

## 2010-11-06 ENCOUNTER — Telehealth (INDEPENDENT_AMBULATORY_CARE_PROVIDER_SITE_OTHER): Payer: Self-pay | Admitting: General Surgery

## 2010-11-06 DIAGNOSIS — C8590 Non-Hodgkin lymphoma, unspecified, unspecified site: Secondary | ICD-10-CM

## 2010-11-06 NOTE — Telephone Encounter (Signed)
Advised pt of dx of lymphoma, referring to medical oncology

## 2010-11-17 ENCOUNTER — Telehealth (INDEPENDENT_AMBULATORY_CARE_PROVIDER_SITE_OTHER): Payer: Self-pay

## 2010-11-18 ENCOUNTER — Other Ambulatory Visit: Payer: Self-pay | Admitting: Oncology

## 2010-11-18 ENCOUNTER — Encounter (HOSPITAL_BASED_OUTPATIENT_CLINIC_OR_DEPARTMENT_OTHER): Payer: Medicare Other | Admitting: Oncology

## 2010-11-18 DIAGNOSIS — E119 Type 2 diabetes mellitus without complications: Secondary | ICD-10-CM

## 2010-11-18 DIAGNOSIS — C8583 Other specified types of non-Hodgkin lymphoma, intra-abdominal lymph nodes: Secondary | ICD-10-CM

## 2010-11-18 DIAGNOSIS — C8590 Non-Hodgkin lymphoma, unspecified, unspecified site: Secondary | ICD-10-CM

## 2010-11-18 DIAGNOSIS — I10 Essential (primary) hypertension: Secondary | ICD-10-CM

## 2010-11-18 DIAGNOSIS — I4891 Unspecified atrial fibrillation: Secondary | ICD-10-CM

## 2010-11-18 LAB — URIC ACID: Uric Acid, Serum: 6.8 mg/dL (ref 2.4–7.0)

## 2010-11-18 LAB — COMPREHENSIVE METABOLIC PANEL
ALT: 17 U/L (ref 0–35)
AST: 18 U/L (ref 0–37)
BUN: 17 mg/dL (ref 6–23)
CO2: 21 mEq/L (ref 19–32)
Creatinine, Ser: 1.13 mg/dL — ABNORMAL HIGH (ref 0.50–1.10)
Total Bilirubin: 0.4 mg/dL (ref 0.3–1.2)

## 2010-11-18 LAB — CBC WITH DIFFERENTIAL/PLATELET
BASO%: 0.6 % (ref 0.0–2.0)
Eosinophils Absolute: 0.2 10*3/uL (ref 0.0–0.5)
HCT: 42 % (ref 34.8–46.6)
LYMPH%: 23.9 % (ref 14.0–49.7)
MCHC: 33.3 g/dL (ref 31.5–36.0)
MONO#: 0.7 10*3/uL (ref 0.1–0.9)
NEUT#: 5.5 10*3/uL (ref 1.5–6.5)
NEUT%: 65.2 % (ref 38.4–76.8)
Platelets: 382 10*3/uL (ref 145–400)
RBC: 4.86 10*6/uL (ref 3.70–5.45)
WBC: 8.5 10*3/uL (ref 3.9–10.3)
lymph#: 2 10*3/uL (ref 0.9–3.3)

## 2010-11-18 LAB — LACTATE DEHYDROGENASE: LDH: 259 U/L — ABNORMAL HIGH (ref 94–250)

## 2010-11-19 ENCOUNTER — Encounter: Payer: Medicare Other | Admitting: Oncology

## 2010-11-19 ENCOUNTER — Other Ambulatory Visit: Payer: Self-pay | Admitting: Oncology

## 2010-11-19 DIAGNOSIS — C8590 Non-Hodgkin lymphoma, unspecified, unspecified site: Secondary | ICD-10-CM

## 2010-11-20 ENCOUNTER — Ambulatory Visit (HOSPITAL_COMMUNITY): Payer: Medicare Other | Attending: Internal Medicine | Admitting: Radiology

## 2010-11-20 ENCOUNTER — Other Ambulatory Visit (HOSPITAL_COMMUNITY): Payer: Self-pay | Admitting: Internal Medicine

## 2010-11-20 DIAGNOSIS — I319 Disease of pericardium, unspecified: Secondary | ICD-10-CM | POA: Insufficient documentation

## 2010-11-20 DIAGNOSIS — I059 Rheumatic mitral valve disease, unspecified: Secondary | ICD-10-CM | POA: Insufficient documentation

## 2010-11-20 DIAGNOSIS — Z01818 Encounter for other preprocedural examination: Secondary | ICD-10-CM

## 2010-11-20 DIAGNOSIS — I495 Sick sinus syndrome: Secondary | ICD-10-CM | POA: Insufficient documentation

## 2010-11-20 DIAGNOSIS — F17200 Nicotine dependence, unspecified, uncomplicated: Secondary | ICD-10-CM | POA: Insufficient documentation

## 2010-11-20 DIAGNOSIS — C8589 Other specified types of non-Hodgkin lymphoma, extranodal and solid organ sites: Secondary | ICD-10-CM | POA: Insufficient documentation

## 2010-11-20 DIAGNOSIS — E785 Hyperlipidemia, unspecified: Secondary | ICD-10-CM | POA: Insufficient documentation

## 2010-11-20 DIAGNOSIS — I079 Rheumatic tricuspid valve disease, unspecified: Secondary | ICD-10-CM | POA: Insufficient documentation

## 2010-11-21 ENCOUNTER — Encounter (HOSPITAL_COMMUNITY): Payer: Self-pay | Admitting: Internal Medicine

## 2010-11-24 ENCOUNTER — Other Ambulatory Visit: Payer: Self-pay | Admitting: Oncology

## 2010-11-24 ENCOUNTER — Ambulatory Visit (HOSPITAL_COMMUNITY)
Admission: RE | Admit: 2010-11-24 | Discharge: 2010-11-24 | Disposition: A | Discharge status: Home / Self Care | Payer: Medicare Other | Source: Ambulatory Visit | Attending: Oncology | Admitting: Oncology

## 2010-11-24 ENCOUNTER — Ambulatory Visit (HOSPITAL_COMMUNITY): Payer: Medicare Other

## 2010-11-24 ENCOUNTER — Other Ambulatory Visit: Payer: Self-pay | Admitting: Interventional Radiology

## 2010-11-24 ENCOUNTER — Encounter (HOSPITAL_COMMUNITY): Payer: Self-pay

## 2010-11-24 DIAGNOSIS — C8590 Non-Hodgkin lymphoma, unspecified, unspecified site: Secondary | ICD-10-CM

## 2010-11-24 DIAGNOSIS — C8589 Other specified types of non-Hodgkin lymphoma, extranodal and solid organ sites: Secondary | ICD-10-CM | POA: Insufficient documentation

## 2010-11-24 HISTORY — PX: BONE BIOPSY: SHX375

## 2010-11-24 LAB — CBC
MCH: 27.5 pg (ref 26.0–34.0)
Platelets: 325 10*3/uL (ref 150–400)
RBC: 4.51 MIL/uL (ref 3.87–5.11)

## 2010-11-24 LAB — PROTIME-INR: Prothrombin Time: 15.3 seconds — ABNORMAL HIGH (ref 11.6–15.2)

## 2010-11-25 ENCOUNTER — Ambulatory Visit (HOSPITAL_COMMUNITY): Admit: 2010-11-25 | Payer: Self-pay | Admitting: General Surgery

## 2010-11-25 ENCOUNTER — Other Ambulatory Visit: Payer: Self-pay | Admitting: Interventional Radiology

## 2010-11-25 ENCOUNTER — Encounter (HOSPITAL_COMMUNITY)
Admission: RE | Admit: 2010-11-25 | Discharge: 2010-11-25 | Disposition: A | Discharge status: Home / Self Care | Payer: Medicare Other | Source: Ambulatory Visit | Attending: Oncology | Admitting: Oncology

## 2010-11-25 DIAGNOSIS — I517 Cardiomegaly: Secondary | ICD-10-CM | POA: Insufficient documentation

## 2010-11-25 DIAGNOSIS — R599 Enlarged lymph nodes, unspecified: Secondary | ICD-10-CM | POA: Insufficient documentation

## 2010-11-25 DIAGNOSIS — Z9089 Acquired absence of other organs: Secondary | ICD-10-CM | POA: Insufficient documentation

## 2010-11-25 DIAGNOSIS — C8590 Non-Hodgkin lymphoma, unspecified, unspecified site: Secondary | ICD-10-CM

## 2010-11-25 DIAGNOSIS — Z950 Presence of cardiac pacemaker: Secondary | ICD-10-CM | POA: Insufficient documentation

## 2010-11-25 DIAGNOSIS — K449 Diaphragmatic hernia without obstruction or gangrene: Secondary | ICD-10-CM | POA: Insufficient documentation

## 2010-11-25 DIAGNOSIS — C8582 Other specified types of non-Hodgkin lymphoma, intrathoracic lymph nodes: Secondary | ICD-10-CM | POA: Insufficient documentation

## 2010-11-25 DIAGNOSIS — Z90710 Acquired absence of both cervix and uterus: Secondary | ICD-10-CM | POA: Insufficient documentation

## 2010-11-25 DIAGNOSIS — K7689 Other specified diseases of liver: Secondary | ICD-10-CM | POA: Insufficient documentation

## 2010-11-25 LAB — GLUCOSE, CAPILLARY: Glucose-Capillary: 133 mg/dL — ABNORMAL HIGH (ref 70–99)

## 2010-11-25 MED ORDER — FLUDEOXYGLUCOSE F - 18 (FDG) INJECTION
15.7000 | Freq: Once | INTRAVENOUS | Status: AC | PRN
  Administered 2010-11-25: 15.7 via INTRAVENOUS

## 2010-11-26 ENCOUNTER — Ambulatory Visit (HOSPITAL_COMMUNITY): Admission: RE | Admit: 2010-11-26 | Payer: Medicare Other | Source: Ambulatory Visit | Admitting: General Surgery

## 2010-12-05 ENCOUNTER — Other Ambulatory Visit (INDEPENDENT_AMBULATORY_CARE_PROVIDER_SITE_OTHER): Payer: Self-pay | Admitting: General Surgery

## 2010-12-05 ENCOUNTER — Encounter (HOSPITAL_COMMUNITY): Payer: Medicare Other

## 2010-12-05 LAB — CBC
MCH: 27.8 pg (ref 26.0–34.0)
MCHC: 32.3 g/dL (ref 30.0–36.0)
MCV: 86.2 fL (ref 78.0–100.0)
Platelets: 376 10*3/uL (ref 150–400)
RDW: 13.8 % (ref 11.5–15.5)
WBC: 7.6 10*3/uL (ref 4.0–10.5)

## 2010-12-05 LAB — DIFFERENTIAL
Eosinophils Absolute: 0.1 10*3/uL (ref 0.0–0.7)
Eosinophils Relative: 2 % (ref 0–5)
Lymphs Abs: 1.7 10*3/uL (ref 0.7–4.0)
Monocytes Absolute: 0.7 10*3/uL (ref 0.1–1.0)
Monocytes Relative: 10 % (ref 3–12)

## 2010-12-05 LAB — BASIC METABOLIC PANEL
Calcium: 10.2 mg/dL (ref 8.4–10.5)
Creatinine, Ser: 1.01 mg/dL (ref 0.50–1.10)
GFR calc Af Amer: >60 mL/min (ref >60)
GFR calc non Af Amer: 54 mL/min — ABNORMAL LOW (ref >60)

## 2010-12-05 LAB — URINALYSIS, ROUTINE W REFLEX MICROSCOPIC
Nitrite: NEGATIVE
Protein, ur: NEGATIVE mg/dL

## 2010-12-05 LAB — URINE MICROSCOPIC-ADD ON

## 2010-12-05 LAB — SURGICAL PCR SCREEN: Staphylococcus aureus: NEGATIVE

## 2010-12-08 ENCOUNTER — Other Ambulatory Visit (INDEPENDENT_AMBULATORY_CARE_PROVIDER_SITE_OTHER): Payer: Self-pay | Admitting: General Surgery

## 2010-12-08 ENCOUNTER — Ambulatory Visit (HOSPITAL_COMMUNITY)
Admission: RE | Admit: 2010-12-08 | Discharge: 2010-12-08 | Disposition: A | Discharge status: Home / Self Care | Payer: Medicare Other | Source: Ambulatory Visit | Attending: General Surgery | Admitting: General Surgery

## 2010-12-08 ENCOUNTER — Ambulatory Visit (HOSPITAL_COMMUNITY): Payer: Medicare Other

## 2010-12-08 DIAGNOSIS — I10 Essential (primary) hypertension: Secondary | ICD-10-CM | POA: Insufficient documentation

## 2010-12-08 DIAGNOSIS — Z01810 Encounter for preprocedural cardiovascular examination: Secondary | ICD-10-CM | POA: Insufficient documentation

## 2010-12-08 DIAGNOSIS — C8589 Other specified types of non-Hodgkin lymphoma, extranodal and solid organ sites: Secondary | ICD-10-CM

## 2010-12-08 DIAGNOSIS — E119 Type 2 diabetes mellitus without complications: Secondary | ICD-10-CM | POA: Insufficient documentation

## 2010-12-08 DIAGNOSIS — Z95828 Presence of other vascular implants and grafts: Secondary | ICD-10-CM

## 2010-12-08 DIAGNOSIS — E669 Obesity, unspecified: Secondary | ICD-10-CM | POA: Insufficient documentation

## 2010-12-08 DIAGNOSIS — Z01812 Encounter for preprocedural laboratory examination: Secondary | ICD-10-CM | POA: Insufficient documentation

## 2010-12-08 HISTORY — DX: Presence of other vascular implants and grafts: Z95.828

## 2010-12-08 LAB — PROTIME-INR: Prothrombin Time: 15.7 seconds — ABNORMAL HIGH (ref 11.6–15.2)

## 2010-12-08 LAB — GLUCOSE, CAPILLARY
Glucose-Capillary: 122 mg/dL — ABNORMAL HIGH (ref 70–99)
Glucose-Capillary: 116 mg/dL — ABNORMAL HIGH (ref 70–99)

## 2010-12-08 LAB — APTT: aPTT: 32 seconds (ref 24–37)

## 2010-12-09 NOTE — Op Note (Signed)
NAMEAMMY, LIENHARD               ACCOUNT NO.:  [id number]  MEDICAL RECORD NO.:  [id number]  LOCATION:  DAYL                         FACILITY:  [facility]  PHYSICIAN:  Almond Lint, MD       DATE OF BIRTH:  [birth year]  DATE OF PROCEDURE:  12/08/2010 DATE OF DISCHARGE:  12/08/2010                              OPERATIVE REPORT   PREOPERATIVE DIAGNOSIS:  Lymphoma of the mesentery, non-Hodgkin.  POSTOPERATIVE DIAGNOSIS:  Lymphoma of the mesentery, non-Hodgkin.  PROCEDURE:  Right subclavian Port-A-Cath placement.  SURGEON:  Almond Lint, MD  ASSISTANT:  None.  ANESTHESIA:  General and local.  FINDINGS:  Easy flush, good blood return.  SPECIMEN:  None.  ESTIMATED BLOOD LOSS:  Minimal.  COMPLICATIONS:  None known.  PROCEDURE IN DETAIL:  Ms. Cashion was identified in the holding area and taken to operating room where she was placed supine on the operating room table.  Her right arm was tucked in a shoulder roll, was placed vertically between her shoulder blades.  General anesthesia was induced. Her upper chest and neck were prepped and draped in sterile fashion. Timeout was performed according to surgical safety check list.  When all was correct, we continued.  The vein was accessed immediately with the first stick and the wire would not pass through.  The second step already was accessed and needle was immediately removed.  We checked the vein was accessed again at a different angle and the wire still, did not pass easily.  Retraction of the hand down toward the foot was performed and then the wire passed easily.  The needle was removed and the position of the wire was confirmed to be in the venous system with fluoroscopy.    The area was then anesthetized with local anesthetic as well as her prior pacer site.  This was assessed with #15 blade.  The subcutaneous tissues were divided with the cautery.  A Weitlaner retractor was used for visualization.  A pocket was created on  top of the pectoralis fascia with the cautery.  The port was placed into the pocket and four 2-0 Prolene sutures were used to secure the port to the fascia.  These were not tied down yet.  The tunneler was used to take the catheter out the wire exit site.  The tunneler sheath and dilator were then passed over the wire, and the dilator and wire were removed.  The catheter was advanced through the tunneler sheath and the tunneler sheath was pulled away.  This was done after confirming the length of the catheter at 17 cm.  The catheter was noted to be in place under fluoro in the SVC. There was easy aspiration and easy flush of the port through the skin. The Prolene were then tied down.  3-0 Vicryl was used to interrupt the deep dermal skin.  Easy aspiration and flush were confirmed again with the port tied down.  The skin was then run with 4-0 Monocryl.  The wound was then cleaned, dried, and dressed with Benzoin, Steri-Strips gauze, and Tegaderm.  The patient tolerated the procedure well.  She was awakened from anesthesia and taken to PACU in stable condition.  Almond Lint, MD     FB/MEDQ  D:  12/08/2010  T:  12/08/2010  Job:  [id number]  Electronically Signed by Almond Lint MD on 12/09/2010 10:22:59 AM

## 2010-12-12 ENCOUNTER — Encounter (HOSPITAL_BASED_OUTPATIENT_CLINIC_OR_DEPARTMENT_OTHER): Payer: Medicare Other | Admitting: Oncology

## 2010-12-12 ENCOUNTER — Other Ambulatory Visit: Payer: Self-pay | Admitting: Oncology

## 2010-12-12 DIAGNOSIS — Z5111 Encounter for antineoplastic chemotherapy: Secondary | ICD-10-CM

## 2010-12-12 DIAGNOSIS — C8583 Other specified types of non-Hodgkin lymphoma, intra-abdominal lymph nodes: Secondary | ICD-10-CM

## 2010-12-12 DIAGNOSIS — E119 Type 2 diabetes mellitus without complications: Secondary | ICD-10-CM

## 2010-12-12 DIAGNOSIS — Z5112 Encounter for antineoplastic immunotherapy: Secondary | ICD-10-CM

## 2010-12-12 LAB — COMPREHENSIVE METABOLIC PANEL
AST: 15 U/L (ref 0–37)
Albumin: 4.2 g/dL (ref 3.5–5.2)
Alkaline Phosphatase: 59 U/L (ref 39–117)
BUN: 18 mg/dL (ref 6–23)
Creatinine, Ser: 1.02 mg/dL (ref 0.50–1.10)
Glucose, Bld: 114 mg/dL — ABNORMAL HIGH (ref 70–99)
Potassium: 4.5 mEq/L (ref 3.5–5.3)
Total Bilirubin: 0.4 mg/dL (ref 0.3–1.2)

## 2010-12-12 LAB — CBC WITH DIFFERENTIAL/PLATELET
BASO%: 0.5 % (ref 0.0–2.0)
EOS%: 3.6 % (ref 0.0–7.0)
HCT: 42.1 % (ref 34.8–46.6)
MCH: 28 pg (ref 25.1–34.0)
MCHC: 32.5 g/dL (ref 31.5–36.0)
MCV: 86.1 fL (ref 79.5–101.0)
MONO%: 7.8 % (ref 0.0–14.0)
NEUT%: 68.3 % (ref 38.4–76.8)
lymph#: 1.8 10*3/uL (ref 0.9–3.3)

## 2010-12-12 LAB — URIC ACID: Uric Acid, Serum: 6.5 mg/dL (ref 2.4–7.0)

## 2010-12-22 ENCOUNTER — Other Ambulatory Visit: Payer: Self-pay | Admitting: Oncology

## 2010-12-22 ENCOUNTER — Encounter (HOSPITAL_BASED_OUTPATIENT_CLINIC_OR_DEPARTMENT_OTHER): Payer: Medicare Other | Admitting: Oncology

## 2010-12-22 DIAGNOSIS — I10 Essential (primary) hypertension: Secondary | ICD-10-CM

## 2010-12-22 DIAGNOSIS — E119 Type 2 diabetes mellitus without complications: Secondary | ICD-10-CM

## 2010-12-22 DIAGNOSIS — C8583 Other specified types of non-Hodgkin lymphoma, intra-abdominal lymph nodes: Secondary | ICD-10-CM

## 2010-12-22 LAB — CBC WITH DIFFERENTIAL/PLATELET
BASO%: 2.2 % — ABNORMAL HIGH (ref 0.0–2.0)
Basophils Absolute: 0 10*3/uL (ref 0.0–0.1)
HCT: 38 % (ref 34.8–46.6)
HGB: 12.7 g/dL (ref 11.6–15.9)
MCHC: 33.4 g/dL (ref 31.5–36.0)
MONO#: 0.3 10*3/uL (ref 0.1–0.9)
NEUT%: 21.1 % — ABNORMAL LOW (ref 38.4–76.8)
WBC: 1.5 10*3/uL — ABNORMAL LOW (ref 3.9–10.3)
lymph#: 0.8 10*3/uL — ABNORMAL LOW (ref 0.9–3.3)

## 2010-12-22 LAB — COMPREHENSIVE METABOLIC PANEL
ALT: 20 U/L (ref 0–35)
Albumin: 3.7 g/dL (ref 3.5–5.2)
CO2: 28 mEq/L (ref 19–32)
Calcium: 9.2 mg/dL (ref 8.4–10.5)
Chloride: 100 mEq/L (ref 96–112)
Creatinine, Ser: 0.95 mg/dL (ref 0.50–1.10)
Potassium: 4.2 mEq/L (ref 3.5–5.3)
Total Protein: 6.5 g/dL (ref 6.0–8.3)

## 2010-12-22 LAB — LACTATE DEHYDROGENASE: LDH: 141 U/L (ref 94–250)

## 2010-12-23 ENCOUNTER — Encounter (HOSPITAL_BASED_OUTPATIENT_CLINIC_OR_DEPARTMENT_OTHER): Payer: Medicare Other | Admitting: Oncology

## 2010-12-23 DIAGNOSIS — C8583 Other specified types of non-Hodgkin lymphoma, intra-abdominal lymph nodes: Secondary | ICD-10-CM

## 2010-12-24 ENCOUNTER — Encounter (HOSPITAL_BASED_OUTPATIENT_CLINIC_OR_DEPARTMENT_OTHER): Payer: Medicare Other | Admitting: Oncology

## 2010-12-24 DIAGNOSIS — C8583 Other specified types of non-Hodgkin lymphoma, intra-abdominal lymph nodes: Secondary | ICD-10-CM

## 2010-12-25 ENCOUNTER — Encounter (HOSPITAL_BASED_OUTPATIENT_CLINIC_OR_DEPARTMENT_OTHER): Payer: Medicare Other | Admitting: Oncology

## 2010-12-25 DIAGNOSIS — C8583 Other specified types of non-Hodgkin lymphoma, intra-abdominal lymph nodes: Secondary | ICD-10-CM

## 2010-12-26 ENCOUNTER — Encounter (HOSPITAL_BASED_OUTPATIENT_CLINIC_OR_DEPARTMENT_OTHER): Payer: Medicare Other | Admitting: Oncology

## 2010-12-26 DIAGNOSIS — C8583 Other specified types of non-Hodgkin lymphoma, intra-abdominal lymph nodes: Secondary | ICD-10-CM

## 2010-12-30 ENCOUNTER — Encounter (HOSPITAL_BASED_OUTPATIENT_CLINIC_OR_DEPARTMENT_OTHER): Payer: Medicare Other | Admitting: Oncology

## 2010-12-30 ENCOUNTER — Other Ambulatory Visit: Payer: Self-pay | Admitting: Oncology

## 2010-12-30 DIAGNOSIS — R7989 Other specified abnormal findings of blood chemistry: Secondary | ICD-10-CM

## 2010-12-30 DIAGNOSIS — E119 Type 2 diabetes mellitus without complications: Secondary | ICD-10-CM

## 2010-12-30 DIAGNOSIS — I10 Essential (primary) hypertension: Secondary | ICD-10-CM

## 2010-12-30 DIAGNOSIS — C8583 Other specified types of non-Hodgkin lymphoma, intra-abdominal lymph nodes: Secondary | ICD-10-CM

## 2010-12-30 LAB — CBC WITH DIFFERENTIAL/PLATELET
BASO%: 0.1 % (ref 0.0–2.0)
Basophils Absolute: 0 10*3/uL (ref 0.0–0.1)
EOS%: 0.1 % (ref 0.0–7.0)
HGB: 12.2 g/dL (ref 11.6–15.9)
MCH: 28.9 pg (ref 25.1–34.0)
MCHC: 33.9 g/dL (ref 31.5–36.0)
RBC: 4.23 10*6/uL (ref 3.70–5.45)
RDW: 14.6 % — ABNORMAL HIGH (ref 11.2–14.5)
lymph#: 1.4 10*3/uL (ref 0.9–3.3)

## 2010-12-30 LAB — COMPREHENSIVE METABOLIC PANEL
ALT: 32 U/L (ref 0–35)
AST: 24 U/L (ref 0–37)
Albumin: 3.8 g/dL (ref 3.5–5.2)
Alkaline Phosphatase: 79 U/L (ref 39–117)
Potassium: 4 mEq/L (ref 3.5–5.3)
Sodium: 141 mEq/L (ref 135–145)
Total Protein: 6.2 g/dL (ref 6.0–8.3)

## 2010-12-30 LAB — TECHNOLOGIST REVIEW

## 2011-01-02 ENCOUNTER — Encounter (HOSPITAL_BASED_OUTPATIENT_CLINIC_OR_DEPARTMENT_OTHER): Payer: Medicare Other | Admitting: Oncology

## 2011-01-02 DIAGNOSIS — Z5111 Encounter for antineoplastic chemotherapy: Secondary | ICD-10-CM

## 2011-01-02 DIAGNOSIS — C8583 Other specified types of non-Hodgkin lymphoma, intra-abdominal lymph nodes: Secondary | ICD-10-CM

## 2011-01-02 NOTE — Telephone Encounter (Signed)
Encounter resolved.

## 2011-01-03 ENCOUNTER — Encounter: Payer: Medicare Other | Admitting: Oncology

## 2011-01-22 ENCOUNTER — Other Ambulatory Visit: Payer: Self-pay | Admitting: Oncology

## 2011-01-22 ENCOUNTER — Encounter (HOSPITAL_BASED_OUTPATIENT_CLINIC_OR_DEPARTMENT_OTHER): Payer: Medicare Other | Admitting: Oncology

## 2011-01-22 DIAGNOSIS — I10 Essential (primary) hypertension: Secondary | ICD-10-CM

## 2011-01-22 DIAGNOSIS — Z5111 Encounter for antineoplastic chemotherapy: Secondary | ICD-10-CM

## 2011-01-22 DIAGNOSIS — I4891 Unspecified atrial fibrillation: Secondary | ICD-10-CM

## 2011-01-22 DIAGNOSIS — C8583 Other specified types of non-Hodgkin lymphoma, intra-abdominal lymph nodes: Secondary | ICD-10-CM

## 2011-01-22 DIAGNOSIS — E119 Type 2 diabetes mellitus without complications: Secondary | ICD-10-CM

## 2011-01-22 LAB — CBC WITH DIFFERENTIAL/PLATELET
BASO%: 1.5 % (ref 0.0–2.0)
Basophils Absolute: 0.1 10*3/uL (ref 0.0–0.1)
EOS%: 0.3 % (ref 0.0–7.0)
HCT: 36.5 % (ref 34.8–46.6)
HGB: 11.7 g/dL (ref 11.6–15.9)
LYMPH%: 13.2 % — ABNORMAL LOW (ref 14.0–49.7)
MCH: 28.5 pg (ref 25.1–34.0)
MCHC: 32.1 g/dL (ref 31.5–36.0)
NEUT%: 75.9 % (ref 38.4–76.8)
Platelets: 250 10*3/uL (ref 145–400)

## 2011-01-22 LAB — COMPREHENSIVE METABOLIC PANEL
ALT: 22 U/L (ref 0–35)
Albumin: 4.3 g/dL (ref 3.5–5.2)
CO2: 23 mEq/L (ref 19–32)
Calcium: 9 mg/dL (ref 8.4–10.5)
Chloride: 105 mEq/L (ref 96–112)
Creatinine, Ser: 0.92 mg/dL (ref 0.50–1.10)
Potassium: 4.1 mEq/L (ref 3.5–5.3)
Sodium: 140 mEq/L (ref 135–145)
Total Protein: 6.5 g/dL (ref 6.0–8.3)

## 2011-01-22 LAB — LACTATE DEHYDROGENASE: LDH: 220 U/L (ref 94–250)

## 2011-01-23 ENCOUNTER — Encounter (HOSPITAL_BASED_OUTPATIENT_CLINIC_OR_DEPARTMENT_OTHER): Payer: Medicare Other | Admitting: Oncology

## 2011-01-23 DIAGNOSIS — C8583 Other specified types of non-Hodgkin lymphoma, intra-abdominal lymph nodes: Secondary | ICD-10-CM

## 2011-02-12 ENCOUNTER — Encounter: Payer: Self-pay | Admitting: Oncology

## 2011-02-12 ENCOUNTER — Other Ambulatory Visit: Payer: Self-pay | Admitting: Oncology

## 2011-02-12 ENCOUNTER — Ambulatory Visit (HOSPITAL_BASED_OUTPATIENT_CLINIC_OR_DEPARTMENT_OTHER): Payer: Medicare Other | Admitting: Oncology

## 2011-02-12 DIAGNOSIS — I10 Essential (primary) hypertension: Secondary | ICD-10-CM

## 2011-02-12 DIAGNOSIS — C8330 Diffuse large B-cell lymphoma, unspecified site: Secondary | ICD-10-CM | POA: Insufficient documentation

## 2011-02-12 DIAGNOSIS — E119 Type 2 diabetes mellitus without complications: Secondary | ICD-10-CM

## 2011-02-12 DIAGNOSIS — M169 Osteoarthritis of hip, unspecified: Secondary | ICD-10-CM

## 2011-02-12 DIAGNOSIS — Z5112 Encounter for antineoplastic immunotherapy: Secondary | ICD-10-CM

## 2011-02-12 DIAGNOSIS — Z5111 Encounter for antineoplastic chemotherapy: Secondary | ICD-10-CM

## 2011-02-12 DIAGNOSIS — C8583 Other specified types of non-Hodgkin lymphoma, intra-abdominal lymph nodes: Secondary | ICD-10-CM

## 2011-02-12 LAB — COMPREHENSIVE METABOLIC PANEL
AST: 15 U/L (ref 0–37)
Albumin: 4.1 g/dL (ref 3.5–5.2)
Alkaline Phosphatase: 66 U/L (ref 39–117)
BUN: 15 mg/dL (ref 6–23)
Potassium: 4.3 mEq/L (ref 3.5–5.3)
Sodium: 141 mEq/L (ref 135–145)
Total Bilirubin: 0.3 mg/dL (ref 0.3–1.2)
Total Protein: 6.6 g/dL (ref 6.0–8.3)

## 2011-02-12 LAB — CBC WITH DIFFERENTIAL/PLATELET
BASO%: 0.8 % (ref 0.0–2.0)
EOS%: 0.3 % (ref 0.0–7.0)
MCH: 28.8 pg (ref 25.1–34.0)
MCHC: 32 g/dL (ref 31.5–36.0)
MONO%: 11.6 % (ref 0.0–14.0)
RDW: 18.9 % — ABNORMAL HIGH (ref 11.2–14.5)
lymph#: 1.3 10*3/uL (ref 0.9–3.3)

## 2011-02-12 LAB — LACTATE DEHYDROGENASE: LDH: 207 U/L (ref 94–250)

## 2011-02-13 ENCOUNTER — Encounter (HOSPITAL_BASED_OUTPATIENT_CLINIC_OR_DEPARTMENT_OTHER): Payer: Medicare Other | Admitting: Oncology

## 2011-02-13 DIAGNOSIS — C8583 Other specified types of non-Hodgkin lymphoma, intra-abdominal lymph nodes: Secondary | ICD-10-CM

## 2011-03-03 ENCOUNTER — Encounter: Payer: Self-pay | Admitting: *Deleted

## 2011-03-03 NOTE — Progress Notes (Signed)
Rec'd PT/INR results from Dr. Oneta Rack and forwarded to Dr. Gaylyn Rong. (INR 2.07 on 03/02/11).

## 2011-03-04 ENCOUNTER — Other Ambulatory Visit: Payer: Self-pay | Admitting: Oncology

## 2011-03-06 ENCOUNTER — Ambulatory Visit (HOSPITAL_BASED_OUTPATIENT_CLINIC_OR_DEPARTMENT_OTHER): Payer: Medicare Other | Admitting: Oncology

## 2011-03-06 ENCOUNTER — Other Ambulatory Visit (HOSPITAL_BASED_OUTPATIENT_CLINIC_OR_DEPARTMENT_OTHER): Payer: Medicare Other | Admitting: Lab

## 2011-03-06 ENCOUNTER — Other Ambulatory Visit: Payer: Self-pay | Admitting: Oncology

## 2011-03-06 ENCOUNTER — Telehealth: Payer: Self-pay | Admitting: Oncology

## 2011-03-06 ENCOUNTER — Ambulatory Visit (HOSPITAL_BASED_OUTPATIENT_CLINIC_OR_DEPARTMENT_OTHER): Payer: Medicare Other

## 2011-03-06 VITALS — BP 106/66 | HR 81 | Temp 98.3°F | Wt 226.9 lb

## 2011-03-06 VITALS — BP 88/55 | HR 75 | Temp 98.9°F

## 2011-03-06 DIAGNOSIS — Z5111 Encounter for antineoplastic chemotherapy: Secondary | ICD-10-CM

## 2011-03-06 DIAGNOSIS — C8590 Non-Hodgkin lymphoma, unspecified, unspecified site: Secondary | ICD-10-CM

## 2011-03-06 DIAGNOSIS — C8583 Other specified types of non-Hodgkin lymphoma, intra-abdominal lymph nodes: Secondary | ICD-10-CM

## 2011-03-06 DIAGNOSIS — C8330 Diffuse large B-cell lymphoma, unspecified site: Secondary | ICD-10-CM

## 2011-03-06 LAB — CBC WITH DIFFERENTIAL/PLATELET
BASO%: 0.4 % (ref 0.0–2.0)
HCT: 32.5 % — ABNORMAL LOW (ref 34.8–46.6)
MCHC: 33.4 g/dL (ref 31.5–36.0)
MONO#: 1.1 10*3/uL — ABNORMAL HIGH (ref 0.1–0.9)
NEUT%: 77.6 % — ABNORMAL HIGH (ref 38.4–76.8)
RBC: 3.55 10*6/uL — ABNORMAL LOW (ref 3.70–5.45)
RDW: 21.3 % — ABNORMAL HIGH (ref 11.2–14.5)
WBC: 8.9 10*3/uL (ref 3.9–10.3)
lymph#: 0.8 10*3/uL — ABNORMAL LOW (ref 0.9–3.3)

## 2011-03-06 LAB — LACTATE DEHYDROGENASE: LDH: 185 U/L (ref 94–250)

## 2011-03-06 LAB — COMPREHENSIVE METABOLIC PANEL
BUN: 14 mg/dL (ref 6–23)
CO2: 24 mEq/L (ref 19–32)
Calcium: 9 mg/dL (ref 8.4–10.5)
Chloride: 103 mEq/L (ref 96–112)
Creatinine, Ser: 0.92 mg/dL (ref 0.50–1.10)
Glucose, Bld: 128 mg/dL — ABNORMAL HIGH (ref 70–99)
Total Bilirubin: 0.3 mg/dL (ref 0.3–1.2)

## 2011-03-06 MED ORDER — HEPARIN SOD (PORK) LOCK FLUSH 100 UNIT/ML IV SOLN
500.0000 [IU] | Freq: Once | INTRAVENOUS | Status: AC | PRN
  Administered 2011-03-06: 500 [IU]
  Filled 2011-03-06: qty 5

## 2011-03-06 MED ORDER — SODIUM CHLORIDE 0.9 % IJ SOLN
10.0000 mL | INTRAMUSCULAR | Status: DC | PRN
  Administered 2011-03-06: 10 mL
  Filled 2011-03-06: qty 10

## 2011-03-06 MED ORDER — VINCRISTINE SULFATE CHEMO INJECTION 1 MG/ML
2.0000 mg | Freq: Once | INTRAVENOUS | Status: AC
  Administered 2011-03-06: 2 mg via INTRAVENOUS
  Filled 2011-03-06: qty 2

## 2011-03-06 MED ORDER — SODIUM CHLORIDE 0.9 % IV SOLN
700.0000 mg | Freq: Once | INTRAVENOUS | Status: AC
  Administered 2011-03-06: 700 mg via INTRAVENOUS
  Filled 2011-03-06: qty 70

## 2011-03-06 MED ORDER — SODIUM CHLORIDE 0.9 % IV SOLN
Freq: Once | INTRAVENOUS | Status: AC
  Administered 2011-03-06: 10:00:00 via INTRAVENOUS

## 2011-03-06 MED ORDER — SODIUM CHLORIDE 0.9 % IV SOLN
1420.0000 mg | Freq: Once | INTRAVENOUS | Status: AC
  Administered 2011-03-06: 1420 mg via INTRAVENOUS
  Filled 2011-03-06: qty 71

## 2011-03-06 MED ORDER — DOXORUBICIN HCL CHEMO IV INJECTION 2 MG/ML
90.0000 mg | Freq: Once | INTRAVENOUS | Status: AC
  Administered 2011-03-06: 90 mg via INTRAVENOUS
  Filled 2011-03-06: qty 45

## 2011-03-06 MED ORDER — SODIUM CHLORIDE 0.9 % IV SOLN
INTRAVENOUS | Status: DC
  Administered 2011-03-06: 13:00:00 via INTRAVENOUS

## 2011-03-06 MED ORDER — DIPHENHYDRAMINE HCL 25 MG PO CAPS
50.0000 mg | ORAL_CAPSULE | Freq: Once | ORAL | Status: AC
  Administered 2011-03-06: 50 mg via ORAL

## 2011-03-06 MED ORDER — DEXAMETHASONE SODIUM PHOSPHATE 4 MG/ML IJ SOLN
20.0000 mg | Freq: Once | INTRAMUSCULAR | Status: AC
  Administered 2011-03-06: 20 mg via INTRAVENOUS

## 2011-03-06 MED ORDER — ONDANSETRON 16 MG/50ML IVPB (CHCC)
16.0000 mg | Freq: Once | INTRAVENOUS | Status: AC
  Administered 2011-03-06: 16 mg via INTRAVENOUS

## 2011-03-06 MED ORDER — SODIUM CHLORIDE 0.9 % IV SOLN
INTRAVENOUS | Status: DC
  Administered 2011-03-06: 16:00:00 via INTRAVENOUS

## 2011-03-06 MED ORDER — ACETAMINOPHEN 325 MG PO TABS
650.0000 mg | ORAL_TABLET | Freq: Once | ORAL | Status: AC
  Administered 2011-03-06: 650 mg via ORAL

## 2011-03-06 NOTE — Patient Instructions (Signed)
Cancer Center Discharge Instructions for Patients Receiving Chemotherapy  Today you received the following chemotherapy agents adriamycin,vincristine,cytoxan, rituxan  To help prevent nausea and vomiting after your treatment, we encourage you to take your nausea medication as prescribed Begin taking it at 6pm and take it as often as prescribed for the next 24 hours.   If you develop nausea and vomiting that is not controlled by your nausea medication, call the clinic. If it is after clinic hours your family physician or the after hours number for the clinic or go to the Emergency Department.   BELOW ARE SYMPTOMS THAT SHOULD BE REPORTED IMMEDIATELY:  *FEVER GREATER THAN 101.0 F  *CHILLS WITH OR WITHOUT FEVER  NAUSEA AND VOMITING THAT IS NOT CONTROLLED WITH YOUR NAUSEA MEDICATION  *UNUSUAL SHORTNESS OF BREATH  *UNUSUAL BRUISING OR BLEEDING  TENDERNESS IN MOUTH AND THROAT WITH OR WITHOUT PRESENCE OF ULCERS  *URINARY PROBLEMS  *BOWEL PROBLEMS  UNUSUAL RASH Items with * indicate a potential emergency and should be followed up as soon as possible.  One of the nurses will contact you 24 hours after your treatment. Please let the nurse know about any problems that you may have experienced. Feel free to call the clinic you have any questions or concerns. The clinic phone number is [phone].   I have been informed and understand all the instructions given to me. I know to contact the clinic, my physician, or go to the Emergency Department if any problems should occur. I do not have any questions at this time, but understand that I may call the clinic.   __________________________________________  _____________  __________ Signature of Patient or Authorized Representative            Date                   Time    __________________________________________ Nurse's Signature

## 2011-03-06 NOTE — Progress Notes (Signed)
Rituxan infusion: started rapid rituxan infusion. At 1/2 hr pt's BP was low.Rituxan was stopped. Dr Gaylyn Rong informed and 250cc bolus infused then waited 1/2 hr and rechecked vitals. BP 88/57.  Per Dr Gaylyn Rong restarted rituxan at 100cc/hr for rest of infusion. Frequent vitals taken and pt remained in low parameters. Dr Gaylyn Rong informed at 1525 and he said to continue infusion and he was aware. At end of infusion BP was 79/50. Dr Gaylyn Rong informed again and 500 cc bolus ordered and administered. Dr Gaylyn Rong said that tomorrow (11/24) when pt comes in for injection the RN is to take BP and if it is lower than 85/50 the pt is to go to the ER. The pt and her son informed of this. The RN Zella Ball that will be in infusion 11/24 also informed.

## 2011-03-06 NOTE — Progress Notes (Signed)
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  CURRENT THERAPY:  started on 8/31/2  q3wk R-CHOP with goal of 6 cycles   INTERVAL HISTORY: [name] Bon 70 y.o. female returns for regular follow up.  After the 4 cycle of chemo, she had about 3 days of diarrhea where she had about 3-4 bouts of stool daily.  This resolved spontaneously without Immodium,  She has mild mucositis which resolved with mouth rinses.  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   She denies fever/[name]/CP/SOB/ abd pain/ bleeding sx/ skin rash/ lower back pain/ abd pain.    MEDICAL HISTORY: Past Medical History  Diagnosis Date  . Sick sinus syndrome   . Hematoma     At the site of the pacemaker insertion.  . Hypotension   . Dyslipidemia   . Diabetes mellitus   . Pacemaker   . Diffuse large B cell lymphoma     SURGICAL HISTORY:  Past Surgical History  Procedure Date  . Pacemaker insertion     Lead revision completed August 04, 2006    MEDICATIONS: Current Outpatient Prescriptions  Medication Sig Dispense Refill  . allopurinol (ZYLOPRIM) 300 MG tablet Take 300 mg by mouth 2 (two) times daily.        Marland Kitchen atorvastatin (LIPITOR) 20 MG tablet Take 20 mg by mouth daily.        . citalopram (CELEXA) 20 MG tablet Take 20 mg by mouth daily.        Marland Kitchen diltiazem (CARDIZEM CD) 180 MG 24 hr capsule Take 180 mg by mouth daily.        Marland Kitchen levothyroxine (SYNTHROID, LEVOTHROID) 100 MCG tablet Take 100 mcg by mouth daily.        Marland Kitchen lisinopril (PRINIVIL,ZESTRIL) 10 MG tablet Take 10 mg by mouth 2 (two) times daily.       . metFORMIN (GLUCOPHAGE) 500 MG tablet Take 1,000 mg by mouth. 2 in the morning and 2 tablet at night      . predniSONE (DELTASONE) 20 MG tablet Take 20 mg by mouth daily. 4 tabs daily days 1 thru 5 of each chemo cycle       . warfarin (COUMADIN) 6 MG tablet Take 6 mg by mouth as  directed.         ALLERGIES:  is allergic to codeine.  REVIEW OF SYSTEMS:  The rest of the 14-point review of system was negative.   Filed Vitals:   03/06/11 0825  BP: 106/66  Pulse: 81  Temp: 98.3 F (36.8 C)   Wt Readings from Last 3 Encounters:  03/06/11 226 lb 14.4 oz (102.921 kg)  08/29/10 238 lb 6.4 oz (108.138 kg)  01/27/10 242 lb (109.77 kg)   ECOG Performance status: 1 due to her morbid obesity and osteoarthritis.   PHYSICAL EXAMINATION: 0  General:  Morbidly obese woman in no acute distress.  Eyes:  no scleral icterus.  ENT:  There were no oropharyngeal lesions.  Neck was without thyromegaly.  Lymphatics:  Negative cervical, supraclavicular or axillary adenopathy.  Respiratory: lungs were clear bilaterally without wheezing or crackles.  Cardiovascular:  Regular rate and rhythm, S1/S2, without murmur, rub or gallop.  There was no pedal edema.  GI:  abdomen was soft, flat, nontender, nondistended, without organomegaly.  Muscoloskeletal:  no spinal tenderness of palpation of vertebral spine.  Skin exam was without echymosis,  petichae.  Neuro exam was nonfocal. Patient was alerted and oriented.  Attention was good.   Language was appropriate.  Mood was normal without depression.  Speech was not pressured.  Thought content was not tangential.     LABORATORY/RADIOLOGY DATA:  Lab Results  Component Value Date   WBC 8.9 03/06/2011   HGB 10.9* 03/06/2011   HCT 32.5* 03/06/2011   PLT 312 03/06/2011   GLUCOSE 150* 02/12/2011   ALT 16 02/12/2011   AST 15 02/12/2011   NA 141 02/12/2011   K 4.3 02/12/2011   CL 103 02/12/2011   CREATININE 0.87 02/12/2011   BUN 15 02/12/2011   CO2 18* 02/12/2011   INR 1.22 12/08/2010    ASSESSMENT AND PLAN:   1. Non-Hodgkin lymphoma:  I discussed with [name] that she is tolerating chemotherapy, R-CHOP, for 4 cycles very well with only grade 1 nausea, grade 1 mucositis, grade 1 diarrhea, grade 1 anemia.  Still none of these warrants dose  modification of chemotherapy.  I advised her to proceed with her 5th cycle of chemotherapy today and return to clinic tomorrow for Neulasta injection to decrease her risk of neutropenic infection.  For prophylaxis, she still has allopurinol 300 mg p.o. b.i.d. to decrease her risk of tumor lysis syndrome.   2. Nausea and vomiting prophylaxis:  She has Compazine and Zofran.   3. Hypothyroidism: She has levothyroxine per PCP. 4. Hypertension:  She is on diltiazem, lisinopril per PCP. 5. Diabetes mellitus, type II:  She is on metformin per PCP. 6. History of atrial fibrillation:  She is on diltiazem, and she has a pacemaker.  She is not on Coumadin for reason.   She follows with [name]. 7. Follow with me in clinic in about 3 weeks before the 6th cycle of chemotherapy.

## 2011-03-06 NOTE — Telephone Encounter (Signed)
Per michelle to gve the pt her dec 2012 appt calendar.

## 2011-03-07 ENCOUNTER — Ambulatory Visit (HOSPITAL_BASED_OUTPATIENT_CLINIC_OR_DEPARTMENT_OTHER): Payer: Medicare Other

## 2011-03-07 VITALS — BP 115/69 | HR 59 | Temp 98.0°F

## 2011-03-07 DIAGNOSIS — C8583 Other specified types of non-Hodgkin lymphoma, intra-abdominal lymph nodes: Secondary | ICD-10-CM

## 2011-03-07 DIAGNOSIS — C8330 Diffuse large B-cell lymphoma, unspecified site: Secondary | ICD-10-CM

## 2011-03-07 DIAGNOSIS — Z5189 Encounter for other specified aftercare: Secondary | ICD-10-CM

## 2011-03-07 MED ORDER — PEGFILGRASTIM INJECTION 6 MG/0.6ML
6.0000 mg | Freq: Once | SUBCUTANEOUS | Status: AC
  Administered 2011-03-07: 6 mg via SUBCUTANEOUS

## 2011-03-25 ENCOUNTER — Ambulatory Visit: Payer: Medicare Other

## 2011-03-25 ENCOUNTER — Other Ambulatory Visit: Payer: Self-pay | Admitting: *Deleted

## 2011-03-25 DIAGNOSIS — C8590 Non-Hodgkin lymphoma, unspecified, unspecified site: Secondary | ICD-10-CM

## 2011-03-25 MED ORDER — PREDNISONE 20 MG PO TABS: 80.0000 mg | ORAL_TABLET | Freq: Every day | ORAL | Status: DC

## 2011-03-25 NOTE — Telephone Encounter (Signed)
Pt in lobby, states she thought her MD and chemo appts were today.  They are scheduled for Friday 03/27/11.  Pt states she was told previously that her appts are today and she arranged for transportation today.  Pt was upset and states she may not be able to make it on Friday as she may not have transportation and arrangements for someone to watch her son.    Instructed pt to call if she has to r/s appts.  Pt also requested refill rx for her prednisone.  She showed nurse bottle which still has several refills left on it, but pt insisted on getting a paper Rx for the pharmacy.  Signed rx given to pt for prednisone as requested.

## 2011-03-27 ENCOUNTER — Ambulatory Visit (HOSPITAL_BASED_OUTPATIENT_CLINIC_OR_DEPARTMENT_OTHER): Payer: Medicare Other | Admitting: Oncology

## 2011-03-27 ENCOUNTER — Other Ambulatory Visit (HOSPITAL_BASED_OUTPATIENT_CLINIC_OR_DEPARTMENT_OTHER): Payer: Medicare Other | Admitting: Lab

## 2011-03-27 ENCOUNTER — Ambulatory Visit (HOSPITAL_BASED_OUTPATIENT_CLINIC_OR_DEPARTMENT_OTHER): Payer: Medicare Other

## 2011-03-27 VITALS — BP 100/63 | HR 79 | Temp 97.5°F | Ht <= 58 in | Wt 223.0 lb

## 2011-03-27 VITALS — BP 124/75 | HR 78 | Temp 98.1°F | Wt 223.0 lb

## 2011-03-27 DIAGNOSIS — Z5111 Encounter for antineoplastic chemotherapy: Secondary | ICD-10-CM

## 2011-03-27 DIAGNOSIS — Z5112 Encounter for antineoplastic immunotherapy: Secondary | ICD-10-CM

## 2011-03-27 DIAGNOSIS — I4891 Unspecified atrial fibrillation: Secondary | ICD-10-CM

## 2011-03-27 DIAGNOSIS — C8583 Other specified types of non-Hodgkin lymphoma, intra-abdominal lymph nodes: Secondary | ICD-10-CM

## 2011-03-27 DIAGNOSIS — C8330 Diffuse large B-cell lymphoma, unspecified site: Secondary | ICD-10-CM

## 2011-03-27 DIAGNOSIS — D649 Anemia, unspecified: Secondary | ICD-10-CM

## 2011-03-27 DIAGNOSIS — C8590 Non-Hodgkin lymphoma, unspecified, unspecified site: Secondary | ICD-10-CM

## 2011-03-27 DIAGNOSIS — I10 Essential (primary) hypertension: Secondary | ICD-10-CM

## 2011-03-27 LAB — COMPREHENSIVE METABOLIC PANEL WITH GFR
ALT: 14 U/L (ref 0–35)
AST: 16 U/L (ref 0–37)
Albumin: 3.8 g/dL (ref 3.5–5.2)
Alkaline Phosphatase: 56 U/L (ref 39–117)
BUN: 10 mg/dL (ref 6–23)
CO2: 23 meq/L (ref 19–32)
Calcium: 9.3 mg/dL (ref 8.4–10.5)
Chloride: 102 meq/L (ref 96–112)
Creatinine, Ser: 0.83 mg/dL (ref 0.50–1.10)
Glucose, Bld: 173 mg/dL — ABNORMAL HIGH (ref 70–99)
Potassium: 4 meq/L (ref 3.5–5.3)
Sodium: 140 meq/L (ref 135–145)
Total Bilirubin: 0.3 mg/dL (ref 0.3–1.2)
Total Protein: 6 g/dL (ref 6.0–8.3)

## 2011-03-27 LAB — CBC WITH DIFFERENTIAL/PLATELET
Eosinophils Absolute: 0 10*3/uL (ref 0.0–0.5)
HCT: 31.3 % — ABNORMAL LOW (ref 34.8–46.6)
HGB: 9.8 g/dL — ABNORMAL LOW (ref 11.6–15.9)
LYMPH%: 11 % — ABNORMAL LOW (ref 14.0–49.7)
MONO#: 1.1 10*3/uL — ABNORMAL HIGH (ref 0.1–0.9)
NEUT#: 7.8 10*3/uL — ABNORMAL HIGH (ref 1.5–6.5)
NEUT%: 77.2 % — ABNORMAL HIGH (ref 38.4–76.8)
Platelets: 353 10*3/uL (ref 145–400)
WBC: 10.1 10*3/uL (ref 3.9–10.3)
lymph#: 1.1 10*3/uL (ref 0.9–3.3)

## 2011-03-27 MED ORDER — ACETAMINOPHEN 325 MG PO TABS
650.0000 mg | ORAL_TABLET | Freq: Once | ORAL | Status: AC
  Administered 2011-03-27: 650 mg via ORAL

## 2011-03-27 MED ORDER — SODIUM CHLORIDE 0.9 % IV SOLN: 700.0000 mg | Freq: Once | INTRAVENOUS | Status: DC

## 2011-03-27 MED ORDER — SODIUM CHLORIDE 0.9 % IV SOLN
1420.0000 mg | Freq: Once | INTRAVENOUS | Status: AC
  Administered 2011-03-27: 1420 mg via INTRAVENOUS
  Filled 2011-03-27: qty 71

## 2011-03-27 MED ORDER — SODIUM CHLORIDE 0.9 % IV SOLN
Freq: Once | INTRAVENOUS | Status: AC
  Administered 2011-03-27: 10:00:00 via INTRAVENOUS

## 2011-03-27 MED ORDER — DIPHENHYDRAMINE HCL 25 MG PO CAPS
50.0000 mg | ORAL_CAPSULE | Freq: Once | ORAL | Status: AC
  Administered 2011-03-27: 50 mg via ORAL

## 2011-03-27 MED ORDER — ONDANSETRON 16 MG/50ML IVPB (CHCC)
16.0000 mg | Freq: Once | INTRAVENOUS | Status: AC
  Administered 2011-03-27: 16 mg via INTRAVENOUS

## 2011-03-27 MED ORDER — VINCRISTINE SULFATE CHEMO INJECTION 1 MG/ML
2.0000 mg | Freq: Once | INTRAVENOUS | Status: AC
  Administered 2011-03-27: 2 mg via INTRAVENOUS
  Filled 2011-03-27: qty 2

## 2011-03-27 MED ORDER — DEXAMETHASONE SODIUM PHOSPHATE 4 MG/ML IJ SOLN
20.0000 mg | Freq: Once | INTRAMUSCULAR | Status: AC
  Administered 2011-03-27: 20 mg via INTRAVENOUS

## 2011-03-27 MED ORDER — SODIUM CHLORIDE 0.9 % IV SOLN
350.0000 mg/m2 | Freq: Once | INTRAVENOUS | Status: AC
  Administered 2011-03-27: 700 mg via INTRAVENOUS
  Filled 2011-03-27: qty 70

## 2011-03-27 MED ORDER — DOXORUBICIN HCL CHEMO IV INJECTION 2 MG/ML
90.0000 mg | Freq: Once | INTRAVENOUS | Status: AC
  Administered 2011-03-27: 90 mg via INTRAVENOUS
  Filled 2011-03-27: qty 45

## 2011-03-27 NOTE — Progress Notes (Signed)
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  CURRENT THERAPY:  started on 8/31/2  q3wk R-CHOP with goal of 6 cycles   INTERVAL HISTORY: [name] 70 y.o. female returns for regular follow up.  She still feel mildly fatigue.  However, she has been taking care of her disable son full time the last few weeks.  She has mild DOE with walking without cough, chest pain.  She denies fever, mucositis, nodal swelling, abd pain, constipation.  She has a few days of loose stool which resolved quickly with Imodium.   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  Patient denies fatigue, headache, visual changes, confusion, drenching night sweats, palpable lymph node swelling, mucositis, odynophagia, dysphagia, nausea vomiting, jaundice, chest pain, palpitation, shortness of breath, dyspnea on exertion, productive cough, gum bleeding, epistaxis, hematemesis, hemoptysis, abdominal pain, abdominal swelling, early satiety, melena, hematochezia, hematuria, skin rash, spontaneous bleeding, joint swelling, joint pain, heat or cold intolerance, bowel bladder incontinence, back pain, focal motor weakness, paresthesia, depression, suicidal or homocidal ideation, feeling hopelessness.   MEDICAL HISTORY: Past Medical History  Diagnosis Date  . Sick sinus syndrome   . Hematoma     At the site of the pacemaker insertion.  . Hypotension   . Dyslipidemia   . Diabetes mellitus   . Pacemaker   . Diffuse large B cell lymphoma     SURGICAL HISTORY:  Past Surgical History  Procedure Date  . Pacemaker insertion     Lead revision completed August 04, 2006    MEDICATIONS: Current Outpatient Prescriptions  Medication Sig Dispense Refill  . allopurinol (ZYLOPRIM) 300 MG tablet Take 300 mg by mouth 2 (two) times daily.        Marland Kitchen atorvastatin (LIPITOR) 20 MG tablet Take 20 mg by mouth daily.        .  citalopram (CELEXA) 20 MG tablet Take 20 mg by mouth daily.        Marland Kitchen diltiazem (CARDIZEM CD) 180 MG 24 hr capsule Take 180 mg by mouth daily.        Marland Kitchen levothyroxine (SYNTHROID, LEVOTHROID) 100 MCG tablet Take 100 mcg by mouth daily.        Marland Kitchen lisinopril (PRINIVIL,ZESTRIL) 10 MG tablet Take 10 mg by mouth 2 (two) times daily.       . metFORMIN (GLUCOPHAGE) 500 MG tablet Take 1,000 mg by mouth. 2 in the morning and 2 tablet at night      . predniSONE (DELTASONE) 20 MG tablet Take 4 tablets (80 mg total) by mouth daily. 4 tabs daily days 1 thru 5 of each chemo cycle  20 tablet  3  . warfarin (COUMADIN) 6 MG tablet Take 6 mg by mouth as directed.         ALLERGIES:  is allergic to codeine.  REVIEW OF SYSTEMS:  The rest of the 14-point review of system was negative.   Filed Vitals:   03/27/11 0842  BP: 124/75  Pulse: 78  Temp: 98.1 F (36.7 C)   Wt Readings from Last 3 Encounters:  03/27/11 223 lb (101.152 kg)  03/06/11 226 lb 14.4 oz (102.921 kg)  08/29/10 238 lb 6.4 oz (108.138 kg)   ECOG Performance status: 1 due to her morbid obesity and osteoarthritis.   PHYSICAL EXAMINATION:   General:  Morbidly obese woman in no acute distress.  Eyes:  no scleral icterus.  ENT:  There were no oropharyngeal lesions.  Neck was without thyromegaly.  Lymphatics:  Negative cervical, supraclavicular or axillary adenopathy.  Respiratory: lungs were clear bilaterally without wheezing or crackles.  Cardiovascular:  Regular rate and rhythm, S1/S2, without murmur, rub or gallop.  There was no pedal edema.  GI:  abdomen was soft, flat, nontender, nondistended, without organomegaly.  Muscoloskeletal:  no spinal tenderness of palpation of vertebral spine.  Skin exam was without echymosis, petichae.  Neuro exam was nonfocal. Patient was alerted and oriented.  Attention was good.   Language was appropriate.  Mood was normal without depression.  Speech was not pressured.  Thought content was not tangential.      LABORATORY/RADIOLOGY DATA:  Lab Results  Component Value Date   WBC 10.1 03/27/2011   HGB 9.8* 03/27/2011   HCT 31.3* 03/27/2011   PLT 353 03/27/2011   GLUCOSE 128* 03/06/2011   GLUCOSE 128* 03/06/2011   ALT 15 03/06/2011   ALT 15 03/06/2011   AST 14 03/06/2011   AST 14 03/06/2011   NA 140 03/06/2011   NA 140 03/06/2011   K 4.2 03/06/2011   K 4.2 03/06/2011   CL 103 03/06/2011   CL 103 03/06/2011   CREATININE 0.92 03/06/2011   CREATININE 0.92 03/06/2011   BUN 14 03/06/2011   BUN 14 03/06/2011   CO2 24 03/06/2011   CO2 24 03/06/2011   INR 1.22 12/08/2010    ASSESSMENT AND PLAN:   1. Non-Hodgkin lymphoma:  I discussed with Ms. Ponce that she is tolerating chemotherapy, R-CHOP, for 5 cycles very well.  She has grade 1 fatigue, grade 1 neuropathy, grade 1 anemia.  None of these warrants dose modification of chemo.  She agreed to proceed with this 6th and last planned cycle of chemo.  I went ahead and ordered a restaging PET scan to be performed in Jan 2013.  If PET shows residual activity albeit shrinkage of the abdominal mass, I may consider referring her to be evaluate for role of consolidative radiation.  2. Anemia:  Most likely from chemo.  She denies evidence of active bleeding.  Will continue to monitor and consider pRBC transfusion in the future for Hgb <7.  3. Nausea and vomiting prophylaxis:  She has Compazine and Zofran.   4. Hypothyroidism: She has levothyroxine per PCP. 5. Hypertension:  She is on diltiazem PCP.  She was on Lisinopril before chemo which she has not been on during chemo for hypotension a few weeks ago.  We will consider resuming this in Jan 2013 after she is done for sure with chemo.   6. Diabetes mellitus, type II:  She is on metformin per PCP.   7. History of atrial fibrillation:  She is on diltiazem, and she has a pacemaker.  She is not on Coumadin for unclear reason, but probably due to her fall risk.  She follows with Dr. Ladona Ridgel. 8. Follow  with me in clinic in January 2013 after restaging PET scan the day prior.

## 2011-03-27 NOTE — Patient Instructions (Signed)
Pt discharged to home via wheelchair. Was given follow up schedule before discharge. Antiemetic instructions reviewed, pt verbalized understanding.

## 2011-03-28 ENCOUNTER — Ambulatory Visit (HOSPITAL_BASED_OUTPATIENT_CLINIC_OR_DEPARTMENT_OTHER): Payer: Medicare Other

## 2011-03-28 VITALS — BP 109/63 | HR 85 | Temp 98.6°F

## 2011-03-28 DIAGNOSIS — C8583 Other specified types of non-Hodgkin lymphoma, intra-abdominal lymph nodes: Secondary | ICD-10-CM

## 2011-03-28 DIAGNOSIS — C8330 Diffuse large B-cell lymphoma, unspecified site: Secondary | ICD-10-CM

## 2011-03-28 MED ORDER — PEGFILGRASTIM INJECTION 6 MG/0.6ML
6.0000 mg | Freq: Once | SUBCUTANEOUS | Status: AC
  Administered 2011-03-28: 6 mg via SUBCUTANEOUS

## 2011-03-31 ENCOUNTER — Other Ambulatory Visit: Payer: Self-pay | Admitting: Certified Registered Nurse Anesthetist

## 2011-04-05 ENCOUNTER — Telehealth: Payer: Self-pay | Admitting: Internal Medicine

## 2011-04-05 ENCOUNTER — Emergency Department (HOSPITAL_COMMUNITY): Payer: Medicare Other

## 2011-04-05 ENCOUNTER — Encounter (HOSPITAL_COMMUNITY): Payer: Self-pay

## 2011-04-05 ENCOUNTER — Inpatient Hospital Stay (HOSPITAL_COMMUNITY)
Admission: EM | Admit: 2011-04-05 | Discharge: 2011-04-07 | DRG: 392 | Disposition: A | Discharge status: Home / Self Care | Payer: Medicare Other | Attending: Internal Medicine | Admitting: Internal Medicine

## 2011-04-05 ENCOUNTER — Other Ambulatory Visit: Payer: Self-pay

## 2011-04-05 DIAGNOSIS — E1169 Type 2 diabetes mellitus with other specified complication: Secondary | ICD-10-CM

## 2011-04-05 DIAGNOSIS — I4891 Unspecified atrial fibrillation: Secondary | ICD-10-CM | POA: Diagnosis present

## 2011-04-05 DIAGNOSIS — E669 Obesity, unspecified: Secondary | ICD-10-CM

## 2011-04-05 DIAGNOSIS — I495 Sick sinus syndrome: Secondary | ICD-10-CM

## 2011-04-05 DIAGNOSIS — C8589 Other specified types of non-Hodgkin lymphoma, extranodal and solid organ sites: Secondary | ICD-10-CM | POA: Diagnosis present

## 2011-04-05 DIAGNOSIS — Z950 Presence of cardiac pacemaker: Secondary | ICD-10-CM

## 2011-04-05 DIAGNOSIS — E119 Type 2 diabetes mellitus without complications: Secondary | ICD-10-CM | POA: Diagnosis present

## 2011-04-05 DIAGNOSIS — A088 Other specified intestinal infections: Principal | ICD-10-CM | POA: Diagnosis present

## 2011-04-05 DIAGNOSIS — I10 Essential (primary) hypertension: Secondary | ICD-10-CM | POA: Diagnosis present

## 2011-04-05 DIAGNOSIS — C8330 Diffuse large B-cell lymphoma, unspecified site: Secondary | ICD-10-CM | POA: Diagnosis present

## 2011-04-05 DIAGNOSIS — E039 Hypothyroidism, unspecified: Secondary | ICD-10-CM | POA: Diagnosis present

## 2011-04-05 DIAGNOSIS — K529 Noninfective gastroenteritis and colitis, unspecified: Secondary | ICD-10-CM

## 2011-04-05 DIAGNOSIS — C8590 Non-Hodgkin lymphoma, unspecified, unspecified site: Secondary | ICD-10-CM

## 2011-04-05 DIAGNOSIS — E785 Hyperlipidemia, unspecified: Secondary | ICD-10-CM

## 2011-04-05 HISTORY — DX: Malignant (primary) neoplasm, unspecified: C80.1

## 2011-04-05 LAB — CBC
Hemoglobin: 9.9 g/dL — ABNORMAL LOW (ref 12.0–15.0)
MCH: 29.6 pg (ref 26.0–34.0)
RBC: 3.34 MIL/uL — ABNORMAL LOW (ref 3.87–5.11)
WBC: 5.6 10*3/uL (ref 4.0–10.5)

## 2011-04-05 LAB — DIFFERENTIAL
Basophils Relative: 1 % (ref 0–1)
Eosinophils Relative: 0 % (ref 0–5)
Lymphocytes Relative: 5 % — ABNORMAL LOW (ref 12–46)
Monocytes Relative: 21 % — ABNORMAL HIGH (ref 3–12)
Neutrophils Relative %: 73 % (ref 43–77)

## 2011-04-05 LAB — URINALYSIS, ROUTINE W REFLEX MICROSCOPIC
Glucose, UA: NEGATIVE mg/dL
Hgb urine dipstick: NEGATIVE
Specific Gravity, Urine: 1.025 (ref 1.005–1.030)
Urobilinogen, UA: 2 mg/dL — ABNORMAL HIGH (ref 0.0–1.0)

## 2011-04-05 LAB — COMPREHENSIVE METABOLIC PANEL
AST: 20 U/L (ref 0–37)
Albumin: 3.5 g/dL (ref 3.5–5.2)
Alkaline Phosphatase: 54 U/L (ref 39–117)
Chloride: 96 mEq/L (ref 96–112)
Potassium: 4 mEq/L (ref 3.5–5.1)
Total Bilirubin: 0.4 mg/dL (ref 0.3–1.2)
Total Protein: 6.3 g/dL (ref 6.0–8.3)

## 2011-04-05 LAB — URINE MICROSCOPIC-ADD ON

## 2011-04-05 MED ORDER — BENZONATATE 100 MG PO CAPS
100.0000 mg | ORAL_CAPSULE | Freq: Three times a day (TID) | ORAL | Status: DC | PRN
  Filled 2011-04-05: qty 1

## 2011-04-05 MED ORDER — INSULIN ASPART 100 UNIT/ML ~~LOC~~ SOLN: 0.0000 [IU] | Freq: Every day | SUBCUTANEOUS | Status: DC

## 2011-04-05 MED ORDER — ONDANSETRON HCL 4 MG/2ML IJ SOLN
4.0000 mg | Freq: Once | INTRAMUSCULAR | Status: AC
  Administered 2011-04-05: 4 mg via INTRAVENOUS
  Filled 2011-04-05: qty 2

## 2011-04-05 MED ORDER — ALBUTEROL SULFATE (5 MG/ML) 0.5% IN NEBU: 2.5000 mg | INHALATION_SOLUTION | RESPIRATORY_TRACT | Status: DC | PRN

## 2011-04-05 MED ORDER — DEXTROSE 5 % IV SOLN
1.0000 g | INTRAVENOUS | Status: DC
  Administered 2011-04-06 (×2): 1 g via INTRAVENOUS
  Filled 2011-04-05 (×3): qty 10

## 2011-04-05 MED ORDER — SODIUM CHLORIDE 0.9 % IV SOLN
Freq: Once | INTRAVENOUS | Status: AC
  Administered 2011-04-05: 18:00:00 via INTRAVENOUS

## 2011-04-05 MED ORDER — HYDROCODONE-ACETAMINOPHEN 5-325 MG PO TABS
1.0000 | ORAL_TABLET | ORAL | Status: DC | PRN
Start: 2011-04-05 — End: 2011-04-07

## 2011-04-05 MED ORDER — ACETAMINOPHEN 325 MG PO TABS
650.0000 mg | ORAL_TABLET | Freq: Four times a day (QID) | ORAL | Status: DC | PRN
  Administered 2011-04-06: 650 mg via ORAL
  Filled 2011-04-05: qty 2

## 2011-04-05 MED ORDER — ACETAMINOPHEN 650 MG RE SUPP: 650.0000 mg | Freq: Four times a day (QID) | RECTAL | Status: DC | PRN

## 2011-04-05 MED ORDER — ALLOPURINOL 300 MG PO TABS
300.0000 mg | ORAL_TABLET | Freq: Two times a day (BID) | ORAL | Status: DC
  Filled 2011-04-05 (×3): qty 1

## 2011-04-05 MED ORDER — IPRATROPIUM BROMIDE 0.02 % IN SOLN: 0.5000 mg | RESPIRATORY_TRACT | Status: DC | PRN

## 2011-04-05 MED ORDER — INSULIN ASPART 100 UNIT/ML ~~LOC~~ SOLN: 0.0000 [IU] | Freq: Three times a day (TID) | SUBCUTANEOUS | Status: DC

## 2011-04-05 NOTE — H&P (Addendum)
PCP:   Oncologist Dr. Gaylyn Rong  Chief Complaint:  Fever  HPI: This is a pleasant 70 year old female with B-cell lymphoma, she just completed chemotherapy on the 13th, she was told she had a fever she should go to the ER. She states she's been feeling ill for the past the 2 days. She's had nausea, vomiting and diarrhea. Diarrhea resolved with Imodium, No abdominal pain. She has a cough productive of whitish sputum and a runny nose. Today she developed a temperature off 101.2, so she came to the ER. She also is being treated empirically for a UTI, she doesn't know the name of the antibiotic. She reports no burning or urination, no myalgia, no active mental status, no pain. She states her son who is handicapped also has been having similar symptoms. In the ER she was seen by oncology Dr. Rosie Fate, who believes the patient should be admitted. The hospitalist service was called. History provided by the patient. She reports no shortness of breath, no chest pain.  Review of Systems: Positives bolded  The patient denies anorexia, fever, weight loss,, vision loss, decreased hearing, hoarseness, chest pain, syncope, dyspnea on exertion, peripheral edema, balance deficits, hemoptysis, abdominal pain, melena, hematochezia, severe indigestion/heartburn, hematuria, incontinence, genital sores, muscle weakness, suspicious skin lesions, transient blindness, difficulty walking, depression, unusual weight change, abnormal bleeding, enlarged lymph nodes, angioedema, and breast masses.  Past Medical History: Past Medical History  Diagnosis Date  . Sick sinus syndrome   . Hematoma     At the site of the pacemaker insertion.  . Hypotension   . Dyslipidemia   . Diabetes mellitus   . Pacemaker   . Diffuse large B cell lymphoma    Past Surgical History  Procedure Date  . Pacemaker insertion     Lead revision completed August 04, 2006    Medications: Prior to Admission medications   Medication Sig Start Date End Date  Taking? Authorizing Provider  allopurinol (ZYLOPRIM) 300 MG tablet Take 300 mg by mouth 2 (two) times daily.     Yes Historical Provider, MD  atorvastatin (LIPITOR) 20 MG tablet Take 20 mg by mouth daily.     Yes Historical Provider, MD  citalopram (CELEXA) 20 MG tablet Take 20 mg by mouth daily.     Yes Historical Provider, MD  diltiazem (CARDIZEM CD) 180 MG 24 hr capsule Take 180 mg by mouth daily.     Yes Historical Provider, MD  levothyroxine (SYNTHROID, LEVOTHROID) 100 MCG tablet Take 100 mcg by mouth daily.     Yes Historical Provider, MD  metFORMIN (GLUCOPHAGE) 500 MG tablet Take 1,000 mg by mouth. 2 in the morning and 2 tablet at night   Yes Historical Provider, MD  predniSONE (DELTASONE) 20 MG tablet Take 4 tablets (80 mg total) by mouth daily. 4 tabs daily days 1 thru 5 of each chemo cycle 03/25/11  Yes Jethro Bolus, MD  warfarin (COUMADIN) 6 MG tablet Take 6 mg by mouth every evening.    Yes Historical Provider, MD    Allergies:   Allergies  Allergen Reactions  . Codeine Nausea Only    Social History:  reports that she has been smoking Cigarettes.  She has a 45 pack-year smoking history. She has never used smokeless tobacco. She reports that she does not drink alcohol or use illicit drugs. uses a [employer], lives at home with a handicapped son, no home oxygen  Family History: Family History  Problem Relation Age of Onset  . Stroke Mother   .  Stroke Father   . Aneurysm Son     Brain    Physical Exam: Filed Vitals:   04/05/11 1632 04/05/11 2004 04/05/11 2045  BP: 98/78    Pulse: 48    Temp:  98.8 F (37.1 C) 99.6 F (37.6 C)  TempSrc: Oral Oral Oral  Resp: 20    SpO2: 96%      General:  Alert and oriented times three, well developed and nourished, no acute distress Eyes: PERRLA, pink conjunctiva, no scleral icterus ENT: Moist oral mucosa, neck supple, no thyromegaly Lungs: Course with, wheezing bilateral lungs, no crackles, no use of accessory  muscles Cardiovascular: irregular rate and rhythm, no regurgitation, no gallops, no murmurs. No carotid bruits, no JVD Abdomen: soft, positive BS, non-tender, non-distended, no organomegaly, not an acute abdomen GU: not examined Neuro: CN II - XII grossly intact, sensation intact Musculoskeletal: strength 5/5 all extremities, no clubbing, cyanosis or edema Skin: no rash, no subcutaneous crepitation, no decubitus Psych: appropriate patient   Labs on Admission:   Basename 04/05/11 1740  NA 134*  K 4.0  CL 96  CO2 26  GLUCOSE 180*  BUN 12  CREATININE 0.91  CALCIUM 9.5  MG --  PHOS --    Basename 04/05/11 1740  AST 20  ALT 28  ALKPHOS 54  BILITOT 0.4  PROT 6.3  ALBUMIN 3.5   No results found for this basename: LIPASE:2,AMYLASE:2 in the last 72 hours  Basename 04/05/11 1740  WBC 5.6  NEUTROABS 4.0  HGB 9.9*  HCT 31.2*  MCV 93.4  PLT 105*   No results found for this basename: CKTOTAL:3,CKMB:3,CKMBINDEX:3,TROPONINI:3 in the last 72 hours No results found for this basename: TSH,T4TOTAL,FREET3,T3FREE,THYROIDAB in the last 72 hours No results found for this basename: VITAMINB12:2,FOLATE:2,FERRITIN:2,TIBC:2,IRON:2,RETICCTPCT:2 in the last 72 hours  Radiological Exams on Admission: Dg Chest Portable 1 View  04/05/2011  *RADIOLOGY REPORT*  Clinical Data: Fever.  PORTABLE CHEST - 1 VIEW  Comparison: Single view of the chest 12/08/2010.  Findings: Pacing device and Port-A-Cath are noted.  Lungs appear clear.  Heart size is mildly enlarged.  No pneumothorax or effusion.  IMPRESSION: No acute disease.  Original Report Authenticated By: Bernadene Bell. D'ALESSIO, M.D.   EKG; atrial fibrillation HR 70  Assessment/Plan Present on Admission:   fever  Gastroenteritis  B-cell lymphoma  Admitted to telemetry  Cultures ordered, likely viral in etiology  Will continue current antibiotics  UTI  Already on by mouth antibiotics, repeat UA is clear  Continued antibiotics to cover urinary  sore  .Atrial fibrillation .Cardiac pacemaker in situ .Essential hypertension, benign Stable resume home medications Pharmacy managing Coumadin Wheezing Nebulizers  (Recorded heart rate of 48 noted on admission vitals is erroneous)  Full code On Coumadin/DVT prophylaxis Team 2/Dr. Sammuel Cooper, Damein Gaunce 04/05/2011, 10:45 PM

## 2011-04-05 NOTE — ED Provider Notes (Signed)
History     CSN: [id number]  Arrival date & time 04/05/11  1548   First MD Initiated Contact with Patient 04/05/11 1644      Chief Complaint  Patient presents with  . Fever  . Cough  . Emesis    (Consider location/radiation/quality/duration/timing/severity/associated sxs/prior treatment) HPI Comments: History of stomach cancer on chemotherapy, most recently 1-2 weeks ago.  Presents complaining of fever, nausea, vomiting, not feeling well.  Was told by on call oncologist to come here for eval, possible antibiotics.    Patient is a 70 y.o. female presenting with fever, cough, and vomiting. The history is provided by the patient.  Fever Primary symptoms of the febrile illness include fever, cough and vomiting. The current episode started 2 days ago. This is a new problem. The problem has been gradually worsening.  Cough  Emesis  Associated symptoms include cough and a fever.    Past Medical History  Diagnosis Date  . Sick sinus syndrome   . Hematoma     At the site of the pacemaker insertion.  . Hypotension   . Dyslipidemia   . Diabetes mellitus   . Pacemaker   . Diffuse large B cell lymphoma     Past Surgical History  Procedure Date  . Pacemaker insertion     Lead revision completed August 04, 2006    Family History  Problem Relation Age of Onset  . Stroke Mother   . Stroke Father   . Aneurysm Son     Brain    History  Substance Use Topics  . Smoking status: Current Everyday Smoker -- 1.5 packs/day for 30 years    Types: Cigarettes  . Smokeless tobacco: Never Used  . Alcohol Use: No    OB History    Grav Para Term Preterm Abortions TAB SAB Ect Mult Living                  Review of Systems  Constitutional: Positive for fever.  Respiratory: Positive for cough.   Gastrointestinal: Positive for vomiting.  All other systems reviewed and are negative.    Allergies  Codeine  Home Medications   Current Outpatient Rx  Name Route Sig Dispense  Refill  . ALLOPURINOL 300 MG PO TABS Oral Take 300 mg by mouth 2 (two) times daily.      . ATORVASTATIN CALCIUM 20 MG PO TABS Oral Take 20 mg by mouth daily.      Marland Kitchen CITALOPRAM HYDROBROMIDE 20 MG PO TABS Oral Take 20 mg by mouth daily.      Marland Kitchen DILTIAZEM HCL ER COATED BEADS 180 MG PO CP24 Oral Take 180 mg by mouth daily.      Marland Kitchen LEVOTHYROXINE SODIUM 100 MCG PO TABS Oral Take 100 mcg by mouth daily.      Marland Kitchen METFORMIN HCL 500 MG PO TABS Oral Take 1,000 mg by mouth. 2 in the morning and 2 tablet at night    . PREDNISONE 20 MG PO TABS Oral Take 4 tablets (80 mg total) by mouth daily. 4 tabs daily days 1 thru 5 of each chemo cycle 20 tablet 3  . WARFARIN SODIUM 6 MG PO TABS Oral Take 6 mg by mouth every evening.       BP 98/78  Pulse 48  Resp 20  SpO2 96%  Physical Exam  Constitutional: She is oriented to person, place, and time. She appears well-developed and well-nourished.  HENT:  Head: Normocephalic and atraumatic.  Right Ear: External ear  normal.  Left Ear: External ear normal.  Mouth/Throat: Oropharynx is clear and moist. No oropharyngeal exudate.  Neck: Normal range of motion. Neck supple.  Cardiovascular: Normal rate, regular rhythm and normal heart sounds.   No murmur heard. Pulmonary/Chest: Effort normal and breath sounds normal. No respiratory distress. She has no wheezes. She has no rales.  Abdominal: Soft. Bowel sounds are normal. She exhibits no distension. There is no tenderness. There is no rebound and no guarding.  Musculoskeletal: Normal range of motion.  Lymphadenopathy:    She has no cervical adenopathy.  Neurological: She is alert and oriented to person, place, and time.  Skin: Skin is warm and dry.    ED Course  Procedures (including critical care time)   Labs Reviewed  CBC  DIFFERENTIAL  COMPREHENSIVE METABOLIC PANEL  CULTURE, BLOOD (ROUTINE X 2)  CULTURE, BLOOD (ROUTINE X 2)  URINALYSIS, ROUTINE W REFLEX MICROSCOPIC  URINE CULTURE   No results  found.   No diagnosis found.    MDM  Spoke with Dr. Rosie Fate who will see the patient in the ER.  The disposition will be left up to him.  After he evaluated the patient, they believe the patient should be admitted to medicine.  Spoke with [facility] who will come to see the patient.            Geoffery Lyons, MD 04/05/11 2156

## 2011-04-05 NOTE — ED Notes (Signed)
Pt in bed, alert and oriented x 3, no resp discomfort, denies pain, denies nausea (-) vomiting (-) diarrhea

## 2011-04-05 NOTE — ED Notes (Signed)
Denies pain, denies nausea (-) vomiting

## 2011-04-05 NOTE — ED Notes (Signed)
Pt has had a cough, fever, and vomiting since Tuesday. Hx of stomach cancer, last chemo treatment was last week

## 2011-04-06 ENCOUNTER — Encounter (HOSPITAL_COMMUNITY): Payer: Self-pay

## 2011-04-06 DIAGNOSIS — E119 Type 2 diabetes mellitus without complications: Secondary | ICD-10-CM

## 2011-04-06 DIAGNOSIS — D696 Thrombocytopenia, unspecified: Secondary | ICD-10-CM

## 2011-04-06 DIAGNOSIS — D649 Anemia, unspecified: Secondary | ICD-10-CM

## 2011-04-06 DIAGNOSIS — C8589 Other specified types of non-Hodgkin lymphoma, extranodal and solid organ sites: Secondary | ICD-10-CM

## 2011-04-06 LAB — BASIC METABOLIC PANEL
CO2: 31 mEq/L (ref 19–32)
Calcium: 8.9 mg/dL (ref 8.4–10.5)
Glucose, Bld: 157 mg/dL — ABNORMAL HIGH (ref 70–99)
Sodium: 136 mEq/L (ref 135–145)

## 2011-04-06 LAB — CBC
Hemoglobin: 8.6 g/dL — ABNORMAL LOW (ref 12.0–15.0)
MCH: 29.9 pg (ref 26.0–34.0)
MCV: 95.5 fL (ref 78.0–100.0)
Platelets: 104 10*3/uL — ABNORMAL LOW (ref 150–400)
RBC: 2.88 MIL/uL — ABNORMAL LOW (ref 3.87–5.11)

## 2011-04-06 LAB — GLUCOSE, CAPILLARY: Glucose-Capillary: 179 mg/dL — ABNORMAL HIGH (ref 70–99)

## 2011-04-06 LAB — PROTIME-INR: Prothrombin Time: 17.4 seconds — ABNORMAL HIGH (ref 11.6–15.2)

## 2011-04-06 MED ORDER — ONDANSETRON HCL 4 MG/2ML IJ SOLN
4.0000 mg | Freq: Four times a day (QID) | INTRAMUSCULAR | Status: DC | PRN
  Administered 2011-04-06: 4 mg via INTRAVENOUS
  Filled 2011-04-06: qty 2

## 2011-04-06 MED ORDER — SODIUM CHLORIDE 0.9 % IJ SOLN: 3.0000 mL | INTRAMUSCULAR | Status: DC | PRN

## 2011-04-06 MED ORDER — LABETALOL HCL 5 MG/ML IV SOLN
5.0000 mg | INTRAVENOUS | Status: DC | PRN
  Administered 2011-04-06: 5 mg via INTRAVENOUS
  Filled 2011-04-06 (×2): qty 4

## 2011-04-06 MED ORDER — DILTIAZEM HCL ER COATED BEADS 180 MG PO CP24
180.0000 mg | ORAL_CAPSULE | Freq: Every day | ORAL | Status: DC
  Filled 2011-04-06: qty 1

## 2011-04-06 MED ORDER — SIMVASTATIN 40 MG PO TABS
40.0000 mg | ORAL_TABLET | Freq: Every day | ORAL | Status: DC
Start: 2011-04-06 — End: 2011-04-06
  Filled 2011-04-06: qty 1

## 2011-04-06 MED ORDER — LEVOTHYROXINE SODIUM 100 MCG PO TABS
100.0000 ug | ORAL_TABLET | Freq: Every day | ORAL | Status: DC
  Administered 2011-04-06 – 2011-04-07 (×2): 100 ug via ORAL
  Filled 2011-04-06 (×2): qty 1

## 2011-04-06 MED ORDER — METFORMIN HCL 500 MG PO TABS
1000.0000 mg | ORAL_TABLET | Freq: Two times a day (BID) | ORAL | Status: DC
  Administered 2011-04-06 – 2011-04-07 (×3): 1000 mg via ORAL
  Filled 2011-04-06 (×4): qty 2

## 2011-04-06 MED ORDER — WARFARIN SODIUM 6 MG PO TABS
6.0000 mg | ORAL_TABLET | Freq: Every day | ORAL | Status: AC
  Administered 2011-04-06: 6 mg via ORAL
  Filled 2011-04-06: qty 1

## 2011-04-06 MED ORDER — PREDNISONE 50 MG PO TABS
80.0000 mg | ORAL_TABLET | Freq: Every day | ORAL | Status: DC
  Filled 2011-04-06: qty 1

## 2011-04-06 MED ORDER — WARFARIN SODIUM 6 MG PO TABS
9.0000 mg | ORAL_TABLET | Freq: Once | ORAL | Status: AC
  Administered 2011-04-06: 9 mg via ORAL
  Filled 2011-04-06: qty 1

## 2011-04-06 MED ORDER — POTASSIUM CHLORIDE IN NACL 20-0.9 MEQ/L-% IV SOLN
INTRAVENOUS | Status: DC
  Administered 2011-04-06 (×3): via INTRAVENOUS
  Filled 2011-04-06 (×7): qty 1000

## 2011-04-06 MED ORDER — ROSUVASTATIN CALCIUM 10 MG PO TABS
10.0000 mg | ORAL_TABLET | Freq: Every day | ORAL | Status: DC
  Administered 2011-04-06 – 2011-04-07 (×2): 10 mg via ORAL
  Filled 2011-04-06 (×2): qty 1

## 2011-04-06 MED ORDER — CITALOPRAM HYDROBROMIDE 20 MG PO TABS
20.0000 mg | ORAL_TABLET | Freq: Every day | ORAL | Status: DC
  Administered 2011-04-06 – 2011-04-07 (×2): 20 mg via ORAL
  Filled 2011-04-06 (×2): qty 1

## 2011-04-06 MED ORDER — DILTIAZEM HCL 60 MG PO TABS
60.0000 mg | ORAL_TABLET | Freq: Three times a day (TID) | ORAL | Status: DC
  Administered 2011-04-06 (×2): 60 mg via ORAL
  Filled 2011-04-06 (×7): qty 1

## 2011-04-06 NOTE — Progress Notes (Signed)
Subjective: Feels much better. Has only had one episode of nausea today.  Objective: Vital signs in last 24 hours: Temp:  [98.8 F (37.1 C)-100.1 F (37.8 C)] 100.1 F (37.8 C) (12/24 0515) Pulse Rate:  [48-122] 103  (12/24 1022) Resp:  [18-20] 18  (12/24 1022) BP: (95-119)/(49-78) 95/65 mmHg (12/24 1022) SpO2:  [91 %-97 %] 91 % (12/24 1022) Weight:  [101.152 kg (223 lb)] 223 lb (101.152 kg) (12/24 0411) Weight change:  Last BM Date: 04/05/11  Intake/Output from previous day: 12/23 0701 - 12/24 0700 In: 316.7 [I.V.:316.7] Out: -      Physical Exam: General: Alert, awake, oriented x3, in no acute distress. HEENT: No bruits, no goiter. Heart: Regular rate and rhythm, without murmurs, rubs, gallops. Lungs: Clear to auscultation bilaterally. Abdomen: Soft, nontender, nondistended, positive bowel sounds. Extremities: No clubbing cyanosis or edema with positive pedal pulses. Neuro: Grossly intact, nonfocal.    Lab Results: Basic Metabolic Panel:  Basename 04/06/11 0515 04/05/11 1740  NA 136 134*  K 3.2* 4.0  CL 98 96  CO2 31 26  GLUCOSE 157* 180*  BUN 11 12  CREATININE 0.94 0.91  CALCIUM 8.9 9.5  MG -- --  PHOS -- --   Liver Function Tests:  [facility] 04/05/11 1740  AST 20  ALT 28  ALKPHOS 54  BILITOT 0.4  PROT 6.3  ALBUMIN 3.5   CBC:  Basename 04/06/11 0515 04/05/11 1740  WBC 5.3 5.6  NEUTROABS -- 4.0  HGB 8.6* 9.9*  HCT 27.5* 31.2*  MCV 95.5 93.4  PLT 104* 105*   CBG:  Basename 04/06/11 1137 04/06/11 0736  GLUCAP 140* 147*   Coagulation:  Basename 04/06/11 0515 04/06/11 0025  LABPROT 17.4* 17.4*  INR 1.40 1.40    Recent Results (from the past 240 hour(s))  CULTURE, BLOOD (ROUTINE X 2)     Status: Normal (Preliminary result)   Collection Time   04/05/11  5:25 PM      Component Value Range Status Comment   Specimen Description BLOOD LEFT HAND   Final    Special Requests BOTTLES DRAWN AEROBIC AND ANAEROBIC 5 CC EACH   Final    Setup  Time [id number]   Final    Culture     Final    Value:        BLOOD CULTURE RECEIVED NO GROWTH TO DATE CULTURE WILL BE HELD FOR 5 DAYS BEFORE ISSUING A FINAL NEGATIVE REPORT   Report Status PENDING   Incomplete   CULTURE, BLOOD (ROUTINE X 2)     Status: Normal (Preliminary result)   Collection Time   04/05/11  5:40 PM      Component Value Range Status Comment   Specimen Description BLOOD RIGHT AC   Final    Special Requests BOTTLES DRAWN AEROBIC AND ANAEROBIC 5 CC EACH   Final    Setup Time [id number]   Final    Culture     Final    Value:        BLOOD CULTURE RECEIVED NO GROWTH TO DATE CULTURE WILL BE HELD FOR 5 DAYS BEFORE ISSUING A FINAL NEGATIVE REPORT   Report Status PENDING   Incomplete     Studies/Results: Dg Chest Portable 1 View  04/05/2011  *RADIOLOGY REPORT*  Clinical Data: Fever.  PORTABLE CHEST - 1 VIEW  Comparison: Single view of the chest 12/08/2010.  Findings: Pacing device and Port-A-Cath are noted.  Lungs appear clear.  Heart size is mildly enlarged.  No pneumothorax or  effusion.  IMPRESSION: No acute disease.  Original Report Authenticated By: Bernadene Bell. Maricela Curet, M.D.    Medications: Scheduled Meds:   . sodium chloride   Intravenous Once  . cefTRIAXone (ROCEPHIN)  IV  1 g Intravenous Q24H  . citalopram  20 mg Oral Daily  . diltiazem  60 mg Oral Q8H  . insulin aspart  0-15 Units Subcutaneous TID WC  . insulin aspart  0-5 Units Subcutaneous QHS  . levothyroxine  100 mcg Oral QAC breakfast  . metFORMIN  1,000 mg Oral BID WC  . ondansetron (ZOFRAN) IV  4 mg Intravenous Once  . rosuvastatin  10 mg Oral Daily  . warfarin  6 mg Oral QHS  . warfarin  9 mg Oral Once  . DISCONTD: allopurinol  300 mg Oral BID  . DISCONTD: diltiazem  180 mg Oral Daily  . DISCONTD: predniSONE  80 mg Oral QAC breakfast  . DISCONTD: simvastatin  40 mg Oral q1800   Continuous Infusions:   . 0.9 % NaCl with KCl 20 mEq / L 100 mL/hr at 04/06/11 0250   PRN Meds:.acetaminophen,  acetaminophen, albuterol, benzonatate, HYDROcodone-acetaminophen, ipratropium, labetalol, ondansetron (ZOFRAN) IV, sodium chloride  Assessment/Plan:  Active Problems:  Essential hypertension, benign  Atrial fibrillation  Cardiac pacemaker in situ  Diffuse large B cell lymphoma  Sick sinus syndrome  Hypothyroidism  Diabetes mellitus  Hyperlipidemia   #1 likely viral gastroenteritis: This would be the most likely explanation for her diarrhea, nausea, vomiting as well as low-grade fevers that has mostly resolved. Continue symptomatic treatment. Patient would rather stay in the hospital one more day as when she goes home she has to take care of a handicapped son and would like to be a little stronger prior to doing that. She recently received chemotherapy for her B cell lymphoma, however she is not currently neutropenic. She has already been evaluated by Dr. Gaylyn Rong with oncology.   LOS: 1 day   [facility] Pager: [phone] 04/06/2011, 12:59 PM

## 2011-04-06 NOTE — ED Notes (Signed)
Heart rate irregular, 133-144 a-fib on monitor, md notified, waiting for further orders

## 2011-04-06 NOTE — ED Notes (Signed)
Bed assigned, tried to call report rn [name] is not ready to take report

## 2011-04-06 NOTE — Progress Notes (Signed)
Telemetry tech called to let RN know that patient had a 9 beat run of v.tach.  At that time, patient was feeling nauseated and trying not to vomit.  Patient denies any SOB, discomfort in chest, or change of heart rhythm.  BP 95/65 HR=103.  Dr. Ardyth Harps notified and order for zofran was given for nausea.

## 2011-04-06 NOTE — Progress Notes (Signed)
ANTICOAGULATION CONSULT NOTE - Follow Up Consult  Pharmacy Consult for Coumadin Indication: atrial fibrillation  Allergies  Allergen Reactions  . Codeine Nausea Only    Patient Measurements: Height: 4\' 8"  (142.2 cm) Weight: 223 lb (101.152 kg) IBW/kg (Calculated) : 36.3   Vital Signs: Temp: 100.1 F (37.8 C) (12/24 0515) Temp src: Oral (12/24 0515) BP: 117/59 mmHg (12/24 0515) Pulse Rate: 113  (12/24 0515)  Labs:  Basename 04/06/11 0515 04/06/11 0025 04/05/11 1740  HGB 8.6* -- 9.9*  HCT 27.5* -- 31.2*  PLT 104* -- 105*  APTT -- -- --  LABPROT 17.4* 17.4* --  INR 1.40 1.40 --  HEPARINUNFRC -- -- --  CREATININE 0.94 -- 0.91  CKTOTAL -- -- --  CKMB -- -- --  TROPONINI -- -- --   Estimated Creatinine Clearance: 54.8 ml/min (by C-G formula based on Cr of 0.94).   Medications:  Scheduled:    . sodium chloride   Intravenous Once  . allopurinol  300 mg Oral BID  . cefTRIAXone (ROCEPHIN)  IV  1 g Intravenous Q24H  . citalopram  20 mg Oral Daily  . diltiazem  60 mg Oral Q8H  . insulin aspart  0-15 Units Subcutaneous TID WC  . insulin aspart  0-5 Units Subcutaneous QHS  . levothyroxine  100 mcg Oral QAC breakfast  . metFORMIN  1,000 mg Oral BID WC  . ondansetron (ZOFRAN) IV  4 mg Intravenous Once  . predniSONE  80 mg Oral QAC breakfast  . rosuvastatin  10 mg Oral Daily  . warfarin  9 mg Oral Once  . DISCONTD: diltiazem  180 mg Oral Daily  . DISCONTD: simvastatin  40 mg Oral q1800   Infusions:    . 0.9 % NaCl with KCl 20 mEq / L 100 mL/hr at 04/06/11 0250    Assessment:  70 YOF on chronic coumadin PTA for h/o afib  Home regime = 6 mg po daily  Pt received Coumadin 9 mg at 0300 this AM given subtherapeutic INR.  INR at 0500 unchanged (1.4) - as expected  Hgb 8.6, no bleeding/complications noted  Goal of Therapy:  INR 2-3   Plan:   Coumadin 6 mg po x 1 at 2200 tonight instead of 1800 to space out time between this AM dose.  Pharmacy will f/u in AM  to redose coumadin at the normal schedule.  Geoffry Paradise Thi 04/06/2011,8:14 AM

## 2011-04-06 NOTE — Progress Notes (Signed)
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   PAST THERAPY: s/p q3wk R-CHOP x6 cycles; last cycles given on 03/27/11.    CURRENT THERAPY:  Watchful observation  Subjective: Interval History:[name] complained of low grade fever, nausea/vomiting, diarrhea this past weekend.  Since admission, with IVF, her diarrhea, nausea/vomiting have resolved.  She still has low grade fever.  She has also some sinus congestion as well.  She has clear productive cough without SOB, DOE, chest pain, hemoptysis.  Her disable son had similar symptoms las week.    Objective: Vital signs in last 24 hours: Temp:  [98.8 F (37.1 C)-100.1 F (37.8 C)] 100.1 F (37.8 C) (12/24 0515) Pulse Rate:  [48-122] 113  (12/24 0515) Resp:  [18-20] 18  (12/24 0515) BP: (98-119)/(49-78) 117/59 mmHg (12/24 0515) SpO2:  [94 %-97 %] 96 % (12/24 0515) Weight:  [223 lb (101.152 kg)] 223 lb (101.152 kg) (12/24 0411)    Intake/Output from previous day: 12/23 0701 - 12/24 0700 In: 316.7 [I.V.:316.7] Out: -      PHYSICAL EXAM:  General: Morbidly obese woman in no acute distress. Eyes: no scleral icterus. ENT: There were no oropharyngeal lesions. Neck was without thyromegaly. Lymphatics: Negative cervical, supraclavicular or axillary adenopathy. Respiratory: lungs showed mild expiratory wheezes bilateral bases. Cardiovascular: Regular rate and rhythm, S1/S2, without murmur, rub or gallop. There was no pedal edema. GI: abdomen was soft, flat, nontender, nondistended, without organomegaly. Muscoloskeletal: no spinal tenderness of palpation of vertebral spine. Skin exam was without echymosis, petichae. Neuro exam was nonfocal. Patient was alerted and oriented. Attention was good. Language was appropriate. Mood was normal without depression. Speech was not  pressured. Thought content was not tangential.     Studies/Results: Results for orders placed during the hospital encounter of 04/05/11 (from the past 48 hour(s))  CBC     Status: Abnormal   Collection Time   04/05/11  5:40 PM      Component Value Range Comment   WBC 5.6  4.0 - 10.5 (K/uL)    RBC 3.34 (*) 3.87 - 5.11 (MIL/uL)    Hemoglobin 9.9 (*) 12.0 - 15.0 (g/dL)    HCT 78.2 (*) 95.6 - 46.0 (%)    MCV 93.4  78.0 - 100.0 (fL)    MCH 29.6  26.0 - 34.0 (pg)    MCHC 31.7  30.0 - 36.0 (g/dL)    RDW 21.3 (*) 08.6 - 15.5 (%)    Platelets 105 (*) 150 - 400 (K/uL)   DIFFERENTIAL     Status: Abnormal   Collection Time   04/05/11  5:40 PM      Component Value Range Comment   Neutrophils Relative 73  43 - 77 (%)    Lymphocytes Relative 5 (*) 12 - 46 (%)    Monocytes Relative 21 (*) 3 - 12 (%)    Eosinophils Relative 0  0 - 5 (%)    Basophils Relative 1  0 - 1 (%)    Neutro Abs 4.0  1.7 - 7.7 (K/uL)    Lymphs Abs 0.3 (*) 0.7 - 4.0 (K/uL)    Monocytes Absolute 1.2 (*) 0.1 - 1.0 (K/uL)    Eosinophils Absolute 0.0  0.0 - 0.7 (K/uL)    Basophils Absolute 0.1  0.0 - 0.1 (K/uL)    RBC  Morphology RARE NRBCs   POLYCHROMASIA PRESENT   WBC Morphology MILD LEFT SHIFT (1-5% METAS, OCC MYELO, OCC BANDS)   TOXIC GRANULATION   Smear Review PLATELET COUNT CONFIRMED BY SMEAR     COMPREHENSIVE METABOLIC PANEL     Status: Abnormal   Collection Time   04/05/11  5:40 PM      Component Value Range Comment   Sodium 134 (*) 135 - 145 (mEq/L)    Potassium 4.0  3.5 - 5.1 (mEq/L)    Chloride 96  96 - 112 (mEq/L)    CO2 26  19 - 32 (mEq/L)    Glucose, Bld 180 (*) 70 - 99 (mg/dL)    BUN 12  6 - 23 (mg/dL)    Creatinine, Ser 0.45  0.50 - 1.10 (mg/dL)    Calcium 9.5  8.4 - 10.5 (mg/dL)    Total Protein 6.3  6.0 - 8.3 (g/dL)    Albumin 3.5  3.5 - 5.2 (g/dL)    AST 20  0 - 37 (U/L) SLIGHT HEMOLYSIS   ALT 28  0 - 35 (U/L)    Alkaline Phosphatase 54  39 - 117 (U/L)    Total Bilirubin 0.4  0.3 - 1.2 (mg/dL)      GFR calc non Af Amer 62 (*) >90 (mL/min)    GFR calc Af Amer 72 (*) >90 (mL/min)   URINALYSIS, ROUTINE W REFLEX MICROSCOPIC     Status: Abnormal   Collection Time   04/05/11  6:29 PM      Component Value Range Comment   Color, Urine YELLOW  YELLOW     APPearance CLOUDY (*) CLEAR     Specific Gravity, Urine 1.025  1.005 - 1.030     pH 6.0  5.0 - 8.0     Glucose, UA NEGATIVE  NEGATIVE (mg/dL)    Hgb urine dipstick NEGATIVE  NEGATIVE     Bilirubin Urine SMALL (*) NEGATIVE     Ketones, ur NEGATIVE  NEGATIVE (mg/dL)    Protein, ur 30 (*) NEGATIVE (mg/dL)    Urobilinogen, UA 2.0 (*) 0.0 - 1.0 (mg/dL)    Nitrite NEGATIVE  NEGATIVE     Leukocytes, UA NEGATIVE  NEGATIVE    URINE MICROSCOPIC-ADD ON     Status: Abnormal   Collection Time   04/05/11  6:29 PM      Component Value Range Comment   Squamous Epithelial / LPF FEW (*) RARE     WBC, UA 0-2  <3 (WBC/hpf)    RBC / HPF 0-2  <3 (RBC/hpf)   PROTIME-INR     Status: Abnormal   Collection Time   04/06/11 12:25 AM      Component Value Range Comment   Prothrombin Time 17.4 (*) 11.6 - 15.2 (seconds)    INR 1.40  0.00 - 1.49    PROTIME-INR     Status: Abnormal   Collection Time   04/06/11  5:15 AM      Component Value Range Comment   Prothrombin Time 17.4 (*) 11.6 - 15.2 (seconds)    INR 1.40  0.00 - 1.49    BASIC METABOLIC PANEL     Status: Abnormal   Collection Time   04/06/11  5:15 AM      Component Value Range Comment   Sodium 136  135 - 145 (mEq/L)    Potassium 3.2 (*) 3.5 - 5.1 (mEq/L)    Chloride 98  96 - 112 (mEq/L)    CO2 31  19 -  32 (mEq/L)    Glucose, Bld 157 (*) 70 - 99 (mg/dL)    BUN 11  6 - 23 (mg/dL)    Creatinine, Ser 9.60  0.50 - 1.10 (mg/dL)    Calcium 8.9  8.4 - 10.5 (mg/dL)    GFR calc non Af Amer 60 (*) >90 (mL/min)    GFR calc Af Amer 70 (*) >90 (mL/min)   CBC     Status: Abnormal   Collection Time   04/06/11  5:15 AM      Component Value Range Comment   WBC 5.3  4.0 - 10.5 (K/uL)    RBC 2.88 (*) 3.87  - 5.11 (MIL/uL)    Hemoglobin 8.6 (*) 12.0 - 15.0 (g/dL)    HCT 45.4 (*) 09.8 - 46.0 (%)    MCV 95.5  78.0 - 100.0 (fL)    MCH 29.9  26.0 - 34.0 (pg)    MCHC 31.3  30.0 - 36.0 (g/dL)    RDW 11.9 (*) 14.7 - 15.5 (%)    Platelets 104 (*) 150 - 400 (K/uL)   GLUCOSE, CAPILLARY     Status: Abnormal   Collection Time   04/06/11  7:36 AM      Component Value Range Comment   Glucose-Capillary 147 (*) 70 - 99 (mg/dL)    Dg Chest Portable 1 View  04/05/2011  *RADIOLOGY REPORT*  Clinical Data: Fever.  PORTABLE CHEST - 1 VIEW  Comparison: Single view of the chest 12/08/2010.  Findings: Pacing device and Port-A-Cath are noted.  Lungs appear clear.  Heart size is mildly enlarged.  No pneumothorax or effusion.  IMPRESSION: No acute disease.  Original Report Authenticated By: Bernadene Bell. D'ALESSIO, M.D.     MEDICATIONS: Current Facility-Administered Medications  Medication Dose Route Frequency Provider Last Rate Last Dose  . 0.9 %  sodium chloride infusion   Intravenous Once Geoffery Lyons, MD 1,000 mL/hr at 04/05/11 1740    . 0.9 % NaCl with KCl 20 mEq/ L  infusion   Intravenous Continuous Debby Crosley, MD 100 mL/hr at 04/06/11 0250    . acetaminophen (TYLENOL) tablet 650 mg  650 mg Oral Q6H PRN Debby Crosley, MD       Or  . acetaminophen (TYLENOL) suppository 650 mg  650 mg Rectal Q6H PRN Debby Crosley, MD      . ipratropium (ATROVENT) nebulizer solution 0.5 mg  0.5 mg Nebulization Q4H PRN Debby Crosley, MD       And  . albuterol (PROVENTIL) (5 MG/ML) 0.5% nebulizer solution 2.5 mg  2.5 mg Nebulization Q4H PRN Debby Crosley, MD      . allopurinol (ZYLOPRIM) tablet 300 mg  300 mg Oral BID Debby Crosley, MD      . benzonatate (TESSALON) capsule 100 mg  100 mg Oral TID PRN Debby Crosley, MD      . cefTRIAXone (ROCEPHIN) 1 g in dextrose 5 % 50 mL IVPB  1 g Intravenous Q24H Debby Crosley, MD   1 g at 04/06/11 0004  . citalopram (CELEXA) tablet 20 mg  20 mg Oral Daily Debby Crosley, MD   20 mg at 04/06/11  0959  . diltiazem (CARDIZEM) tablet 60 mg  60 mg Oral Q8H Debby Crosley, MD   60 mg at 04/06/11 0512  . HYDROcodone-acetaminophen (NORCO) 5-325 MG per tablet 1-2 tablet  1-2 tablet Oral Q4H PRN Debby Crosley, MD      . insulin aspart (novoLOG) injection 0-15 Units  0-15 Units Subcutaneous TID WC Gery Pray, MD      .  insulin aspart (novoLOG) injection 0-5 Units  0-5 Units Subcutaneous QHS Debby Crosley, MD      . labetalol (NORMODYNE,TRANDATE) injection 5 mg  5 mg Intravenous Q4H PRN Debby Crosley, MD   5 mg at 04/06/11 0513  . levothyroxine (SYNTHROID, LEVOTHROID) tablet 100 mcg  100 mcg Oral QAC breakfast Gery Pray, MD   100 mcg at 04/06/11 0956  . metFORMIN (GLUCOPHAGE) tablet 1,000 mg  1,000 mg Oral BID WC Debby Crosley, MD   1,000 mg at 04/06/11 0805  . ondansetron (ZOFRAN) injection 4 mg  4 mg Intravenous Once Geoffery Lyons, MD   4 mg at 04/05/11 1740  . rosuvastatin (CRESTOR) tablet 10 mg  10 mg Oral Daily Leann Trefz Poindexter, PHARMD   10 mg at 04/06/11 1000  . sodium chloride 0.9 % injection 3 mL  3 mL Intravenous PRN Debby Crosley, MD      . warfarin (COUMADIN) tablet 6 mg  6 mg Oral QHS Thuyvan Thi Phan, PHARMD      . warfarin (COUMADIN) tablet 9 mg  9 mg Oral Once Leann Trefz Poindexter, PHARMD   9 mg at 04/06/11 0255  . DISCONTD: diltiazem (CARDIZEM CD) 24 hr capsule 180 mg  180 mg Oral Daily Debby Crosley, MD      . DISCONTD: predniSONE (DELTASONE) 80 mg  80 mg Oral QAC breakfast Debby Crosley, MD      . DISCONTD: simvastatin (ZOCOR) tablet 40 mg  40 mg Oral q1800 Gery Pray, MD         Assessment/Plan:  1.  Gastroenteritis and URI:  She does not have neutropenic fevers.  Her symptoms are more consistent with these dx.  CXR negative.  Blood/urine culture pending to date.    2.  Hx of mesenteric diffuse large B-cell lymphoma:  Finished chemo 03/27/11.  No further chemo for now.  She is due to get a outpt restaging PET on 04/27/11 at 9:30am and to see me on 04/30/11 at  1:30pm to go over result.  I stopped Prednisone and Allopurinol which were given while she was on active chemo.   3. Anemia and thrombocytopenia: Most likely from chemo. She denies evidence of active bleeding.  Recommend follow CBC and transfuse pRBC (leukopoor; no need for irradiated) for Hgb <7 or Plt <10K or active bleeding.   4.  Hypothyroidism: She has levothyroxine per PCP.   5.  Hypertension:  HTN meds off due to recent hypotensive episodes.    6.  Diabetes mellitus, type II: She is on metformin per PCP.   7.  History of atrial fibrillation: she is now restarted on Coumadin.  I will sign off.   Thank you for your care of this patient.  Please page with questions.

## 2011-04-06 NOTE — Progress Notes (Signed)
Prior to admission, pt took Lipitor 20mg  daily.  Upon admission, order to substitute Zocor for Lipitor was written (per [employer] policy).  Pt concomitantly on diltiazem and doses of Zocor > 10mg  daily are not recommended per manufacturer if also on diltiazem.  Due to potential drug interaction, substitution per [employer] policy is to use Crestor.  Crestor 10mg  will be substituted for Lipitor 20mg  while patient is in hospital.

## 2011-04-06 NOTE — Progress Notes (Signed)
ANTICOAGULATION CONSULT NOTE - Initial Consult  Pharmacy Consult for Coumadin Indication: AFib  Allergies  Allergen Reactions  . Codeine Nausea Only    Patient Measurements: Weight = 101.2 kg (03/27/11) Height = 57.8 inches (03/27/11)   Vital Signs: Temp: 99.5 F (37.5 C) (12/23 2342) Temp src: Oral (12/23 2342) BP: 109/69 mmHg (12/23 2342) Pulse Rate: 117  (12/23 2342)  Labs:  Basename 04/06/11 0025 04/05/11 1740  HGB -- 9.9*  HCT -- 31.2*  PLT -- 105*  APTT -- --  LABPROT 17.4* --  INR 1.40 --  HEPARINUNFRC -- --  CREATININE -- 0.91  CKTOTAL -- --  CKMB -- --  TROPONINI -- --   The CrCl is unknown because both a height and weight (above a minimum accepted value) are required for this calculation.  Medical History: Past Medical History  Diagnosis Date  . Sick sinus syndrome   . Hematoma     At the site of the pacemaker insertion.  . Hypotension   . Dyslipidemia   . Diabetes mellitus   . Pacemaker   . Diffuse large B cell lymphoma     Medications:  Scheduled:    . sodium chloride   Intravenous Once  . allopurinol  300 mg Oral BID  . cefTRIAXone (ROCEPHIN)  IV  1 g Intravenous Q24H  . insulin aspart  0-15 Units Subcutaneous TID WC  . insulin aspart  0-5 Units Subcutaneous QHS  . ondansetron (ZOFRAN) IV  4 mg Intravenous Once   Infusions:    . 0.9 % NaCl with KCl 20 mEq / L      Assessment: 70 year old female admitted for fever with UTI.  Pt with B-cell lymphoma who recently completed chemotherapy. On Coumadin 6mg  daily PTA for atrial fibrillation.   Last dose of Coumadin taken on 04/04/11.  INR upon admission = 1.4 Due to subtherapeutic INR, will give larger dose of Coumadin tonight (1.5 x home dose per protocol).    Goal of Therapy:  INR 2-3   Plan:  1.  Coumadin 9mg  po x 1 now 2.  Check daily PT/INR  Tremane Spurgeon Trefz 04/06/2011,1:20 AM

## 2011-04-07 DIAGNOSIS — K529 Noninfective gastroenteritis and colitis, unspecified: Secondary | ICD-10-CM

## 2011-04-07 LAB — BASIC METABOLIC PANEL
BUN: 8 mg/dL (ref 6–23)
CO2: 29 mEq/L (ref 19–32)
Chloride: 105 mEq/L (ref 96–112)
Glucose, Bld: 120 mg/dL — ABNORMAL HIGH (ref 70–99)
Potassium: 4.1 mEq/L (ref 3.5–5.1)

## 2011-04-07 LAB — CBC
HCT: 29.2 % — ABNORMAL LOW (ref 36.0–46.0)
Hemoglobin: 9.3 g/dL — ABNORMAL LOW (ref 12.0–15.0)
RBC: 3.06 MIL/uL — ABNORMAL LOW (ref 3.87–5.11)
WBC: 6.6 10*3/uL (ref 4.0–10.5)

## 2011-04-07 LAB — URINE CULTURE
Colony Count: NO GROWTH
Culture: NO GROWTH

## 2011-04-07 LAB — GLUCOSE, CAPILLARY: Glucose-Capillary: 137 mg/dL — ABNORMAL HIGH (ref 70–99)

## 2011-04-07 LAB — PROTIME-INR: INR: 1.62 — ABNORMAL HIGH (ref 0.00–1.49)

## 2011-04-07 MED ORDER — WARFARIN SODIUM 6 MG PO TABS
9.0000 mg | ORAL_TABLET | Freq: Once | ORAL | Status: DC
  Filled 2011-04-07: qty 1

## 2011-04-07 NOTE — Progress Notes (Signed)
Discharge instructions given to pt, verbalized understanding. Left the unit in stable condition. 

## 2011-04-07 NOTE — Progress Notes (Signed)
ANTICOAGULATION CONSULT NOTE - Follow Up Consult  Pharmacy Consult for Coumadin Indication: atrial fibrillation  Allergies  Allergen Reactions  . Codeine Nausea Only    Patient Measurements: Height: 4\' 8"  (142.2 cm) Weight: 223 lb (101.152 kg) IBW/kg (Calculated) : 36.3   Vital Signs: BP: 109/55 mmHg (12/25 0615) Pulse Rate: 83  (12/25 0615)  Labs:  Basename 04/07/11 0453 04/06/11 0515 04/06/11 0025 04/05/11 1740  HGB 9.3* 8.6* -- --  HCT 29.2* 27.5* -- 31.2*  PLT 109* 104* -- 105*  APTT -- -- -- --  LABPROT 19.5* 17.4* 17.4* --  INR 1.62* 1.40 1.40 --  HEPARINUNFRC -- -- -- --  CREATININE 0.90 0.94 -- 0.91  CKTOTAL -- -- -- --  CKMB -- -- -- --  TROPONINI -- -- -- --   Estimated Creatinine Clearance: 57.2 ml/min (by C-G formula based on Cr of 0.9).   Medications:  Scheduled:     . cefTRIAXone (ROCEPHIN)  IV  1 g Intravenous Q24H  . citalopram  20 mg Oral Daily  . diltiazem  60 mg Oral Q8H  . insulin aspart  0-15 Units Subcutaneous TID WC  . insulin aspart  0-5 Units Subcutaneous QHS  . levothyroxine  100 mcg Oral QAC breakfast  . metFORMIN  1,000 mg Oral BID WC  . rosuvastatin  10 mg Oral Daily  . warfarin  6 mg Oral QHS   Infusions:     . 0.9 % NaCl with KCl 20 mEq / L 100 mL/hr at 04/06/11 2234    Assessment:  70 YOF on chronic coumadin PTA for h/o afib with home dose 6 mg po daily  Subtherapeutic INR despite increased doses yesterday  H/H improved today, no bleeding reported  Goal of Therapy:  INR 2-3   Plan:   Coumadin 9 mg PO x1 boosted dose today  Recommend daily INR until therapeutic  Lynann Beaver PharmD  Pager [phone] 04/07/2011 11:30 AM

## 2011-04-07 NOTE — Discharge Summary (Signed)
Physician Discharge Summary  Patient ID: [name] MRN: [id number] DOB/AGE: 1941-02-08 70 y.o.  Admit date: 04/05/2011 Discharge date: 04/07/2011  Primary Care Physician:  No primary provider on file. used to see [name], however she is now seeing another doctor whom she does not recall the name.   Discharge Diagnoses:    Principal Problem:  *Gastroenteritis, acute Active Problems:  Essential hypertension, benign  Atrial fibrillation  Cardiac pacemaker in situ  Diffuse large B cell lymphoma  Sick sinus syndrome  Hypothyroidism  Diabetes mellitus  Hyperlipidemia     Current Discharge Medication List    CONTINUE these medications which have NOT CHANGED   Details  allopurinol (ZYLOPRIM) 300 MG tablet Take 300 mg by mouth 2 (two) times daily.      atorvastatin (LIPITOR) 20 MG tablet Take 20 mg by mouth daily.      citalopram (CELEXA) 20 MG tablet Take 20 mg by mouth daily.      diltiazem (CARDIZEM CD) 180 MG 24 hr capsule Take 180 mg by mouth daily.      levothyroxine (SYNTHROID, LEVOTHROID) 100 MCG tablet Take 100 mcg by mouth daily.      metFORMIN (GLUCOPHAGE) 500 MG tablet Take 1,000 mg by mouth. 2 in the morning and 2 tablet at night    predniSONE (DELTASONE) 20 MG tablet Take 4 tablets (80 mg total) by mouth daily. 4 tabs daily days 1 thru 5 of each chemo cycle Qty: 20 tablet, Refills: 3   Associated Diagnoses: Lymphoma    warfarin (COUMADIN) 6 MG tablet Take 6 mg by mouth every evening.          Disposition and Follow-up:  Patient will be discharged home today in stable and improved condition. She has been instructed to followup with her oncologist and with her primary care physician as previously scheduled.  Consults:  hematology/oncology Dr. Gaylyn Rong   Significant Diagnostic Studies:  Dg Chest Portable 1 View  04/05/2011  *RADIOLOGY REPORT*  Clinical Data: Fever.  PORTABLE CHEST - 1 VIEW  Comparison: Single view of the chest 12/08/2010.   Findings: Pacing device and Port-A-Cath are noted.  Lungs appear clear.  Heart size is mildly enlarged.  No pneumothorax or effusion.  IMPRESSION: No acute disease.  Original Report Authenticated By: Bernadene Bell. D'ALESSIO, M.D.    Brief H and P: Patient is a 70 year old white woman with a history of B-cell lymphoma who just completed chemotherapy on December 13. She was told to go to the ER if she developed any fevers. She had been feeling ill for the past 2 days with nausea vomiting and diarrhea. She had a temperature of 101.2 so she came into the hospital for evaluation and we were asked to see her for further management.    Hospital Course:  Principal Problem:  *Gastroenteritis, acute Active Problems:  Essential hypertension, benign  Atrial fibrillation  Cardiac pacemaker in situ  Diffuse large B cell lymphoma  Sick sinus syndrome  Hypothyroidism  Diabetes mellitus  Hyperlipidemia   #1 nausea, vomiting, diarrhea: Now resolved. Likely secondary to viral gastroenteritis. This also likely explain her temperatures. Continue symptomatic treatment. Patient is not neutropenic and hence does not have true neutropenic fever. She has been seen in consultation by her oncologist, Dr. Gaylyn Rong, who has no further recommendations at this point and agrees with current plan of action. She will be discharged home today and instructed to stay hydrated and rest.  Time spent on Discharge: Greater than 30 minutes.  Signed: HERNANDEZ  [facility] Pager: [phone] 04/07/2011, 11:27 AM

## 2011-04-11 LAB — CULTURE, BLOOD (ROUTINE X 2)
Culture  Setup Time: 201212232258
Culture: NO GROWTH
Culture: NO GROWTH

## 2011-04-27 ENCOUNTER — Encounter (HOSPITAL_COMMUNITY)
Admission: RE | Admit: 2011-04-27 | Discharge: 2011-04-27 | Disposition: A | Discharge status: Home / Self Care | Payer: Medicare Other | Source: Ambulatory Visit | Attending: Oncology | Admitting: Oncology

## 2011-04-27 ENCOUNTER — Other Ambulatory Visit: Payer: Self-pay | Admitting: Oncology

## 2011-04-27 ENCOUNTER — Encounter (HOSPITAL_COMMUNITY): Payer: Self-pay

## 2011-04-27 DIAGNOSIS — C8330 Diffuse large B-cell lymphoma, unspecified site: Secondary | ICD-10-CM

## 2011-04-27 DIAGNOSIS — C8589 Other specified types of non-Hodgkin lymphoma, extranodal and solid organ sites: Secondary | ICD-10-CM | POA: Insufficient documentation

## 2011-04-27 DIAGNOSIS — R911 Solitary pulmonary nodule: Secondary | ICD-10-CM | POA: Insufficient documentation

## 2011-04-27 MED ORDER — FLUDEOXYGLUCOSE F - 18 (FDG) INJECTION: 17.9000 | Freq: Once | INTRAVENOUS | Status: AC | PRN

## 2011-04-30 ENCOUNTER — Ambulatory Visit (HOSPITAL_BASED_OUTPATIENT_CLINIC_OR_DEPARTMENT_OTHER): Payer: Medicare Other | Admitting: Oncology

## 2011-04-30 ENCOUNTER — Other Ambulatory Visit (HOSPITAL_BASED_OUTPATIENT_CLINIC_OR_DEPARTMENT_OTHER): Payer: Medicare Other

## 2011-04-30 VITALS — BP 97/67 | HR 72 | Temp 97.3°F | Ht <= 58 in | Wt 209.0 lb

## 2011-04-30 DIAGNOSIS — C8590 Non-Hodgkin lymphoma, unspecified, unspecified site: Secondary | ICD-10-CM

## 2011-04-30 DIAGNOSIS — C8589 Other specified types of non-Hodgkin lymphoma, extranodal and solid organ sites: Secondary | ICD-10-CM

## 2011-04-30 DIAGNOSIS — C8330 Diffuse large B-cell lymphoma, unspecified site: Secondary | ICD-10-CM

## 2011-04-30 DIAGNOSIS — D649 Anemia, unspecified: Secondary | ICD-10-CM

## 2011-04-30 DIAGNOSIS — I10 Essential (primary) hypertension: Secondary | ICD-10-CM

## 2011-04-30 DIAGNOSIS — E039 Hypothyroidism, unspecified: Secondary | ICD-10-CM

## 2011-04-30 LAB — CBC WITH DIFFERENTIAL/PLATELET
Basophils Absolute: 0.1 10*3/uL (ref 0.0–0.1)
Eosinophils Absolute: 0.2 10*3/uL (ref 0.0–0.5)
HGB: 11.2 g/dL — ABNORMAL LOW (ref 11.6–15.9)
LYMPH%: 18.4 % (ref 14.0–49.7)
MCV: 89.2 fL (ref 79.5–101.0)
MONO%: 12.2 % (ref 0.0–14.0)
NEUT#: 5.6 10*3/uL (ref 1.5–6.5)
NEUT%: 65.3 % (ref 38.4–76.8)
Platelets: 372 10*3/uL (ref 145–400)
RBC: 4.09 10*6/uL (ref 3.70–5.45)

## 2011-04-30 LAB — COMPREHENSIVE METABOLIC PANEL
BUN: 11 mg/dL (ref 6–23)
CO2: 26 mEq/L (ref 19–32)
Creatinine, Ser: 0.9 mg/dL (ref 0.50–1.10)
Glucose, Bld: 132 mg/dL — ABNORMAL HIGH (ref 70–99)
Total Bilirubin: 0.4 mg/dL (ref 0.3–1.2)

## 2011-04-30 LAB — LACTATE DEHYDROGENASE: LDH: 189 U/L (ref 94–250)

## 2011-04-30 MED ORDER — SODIUM CHLORIDE 0.9 % IJ SOLN
10.0000 mL | INTRAMUSCULAR | Status: DC | PRN
  Filled 2011-04-30: qty 10

## 2011-04-30 MED ORDER — HEPARIN SOD (PORK) LOCK FLUSH 100 UNIT/ML IV SOLN
500.0000 [IU] | Freq: Once | INTRAVENOUS | Status: DC
  Filled 2011-04-30: qty 5

## 2011-05-01 NOTE — Progress Notes (Signed)
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  PASTTHERAPY:   q3wk R-CHOP x6 cycles; finished in Dec 2012.   CURRENT THERAPY:  Pending evaluation for consolidative radiation.   INTERVAL HISTORY: [name] 71 y.o. female returns for regular follow up.  She is here with her niece.  She has mild fatigue which has improved a lot since she finished chemo a few weeks ago.  She is still able to take care of her disable son.  However, she has severe osteoarthritis bone pain coupled with obesity; thus, she is not very mobile except for ambulating short distant around her house.  She has some nasal congestion specially in the morning with productive cough with clear phlegm in the morning.  She has occasional diarrhea with 1-2x/day; not related to food intake; improved with Imodium prn.  She has baseline neuropathy in hands and feet which did not worsen with chemotherapy. She has weight loss; however, she is trying to eat less than before.  She denies palpable node swelling, night sweat, abdominal pain, bleeding symptoms, PND, orthopnea, pedal edema.      MEDICAL HISTORY: Past Medical History  Diagnosis Date  . Sick sinus syndrome   . Hematoma     At the site of the pacemaker insertion.  . Hypotension   . Dyslipidemia   . Diabetes mellitus   . Pacemaker   . Diffuse large B cell lymphoma   . Cancer 10/2010    large B cell lymphoma    SURGICAL HISTORY:  Past Surgical History  Procedure Date  . Pacemaker insertion     Lead revision completed August 04, 2006  . Cholecystectomy   . Tubal ligation   . Insert / replace / remove pacemaker   . Abdominal hysterectomy     MEDICATIONS: Current Outpatient Prescriptions  Medication Sig Dispense Refill  . citalopram (CELEXA) 20 MG tablet Take 20 mg by mouth daily.        Marland Kitchen diltiazem (CARDIZEM CD) 180 MG 24 hr capsule Take 180 mg by mouth daily.        Marland Kitchen levothyroxine (SYNTHROID,  LEVOTHROID) 100 MCG tablet Take 100 mcg by mouth daily.        . metFORMIN (GLUCOPHAGE) 500 MG tablet Take 1,000 mg by mouth. 2 in the morning and 2 tablet at night      . warfarin (COUMADIN) 6 MG tablet Take 6 mg by mouth every evening.       Marland Kitchen atorvastatin (LIPITOR) 20 MG tablet Take 20 mg by mouth daily.         Current Facility-Administered Medications  Medication Dose Route Frequency Provider Last Rate Last Dose  . heparin lock flush 100 unit/mL  500 Units Intravenous Once Jethro Bolus, MD      . sodium chloride 0.9 % injection 10 mL  10 mL Intravenous PRN Jethro Bolus, MD        ALLERGIES:  is allergic to codeine.  REVIEW OF SYSTEMS:  The rest of the 14-point review of system was negative.   Filed Vitals:   04/30/11 1400  BP: 97/67  Pulse: 72  Temp: 97.3 F (36.3 C)   Wt Readings from Last 3 Encounters:  04/30/11 209 lb (94.802 kg)  04/06/11 223 lb (101.152 kg)  03/27/11 223 lb (101.152 kg)   ECOG Performance status: 1 due to her morbid obesity and osteoarthritis.   PHYSICAL EXAMINATION:   General:  Morbidly obese woman in  no acute distress.  Eyes:  no scleral icterus.  ENT:  There were no oropharyngeal lesions.  Neck was without thyromegaly.  Lymphatics:  Negative cervical, supraclavicular or axillary adenopathy.  Respiratory: lungs were clear bilaterally without wheezing or crackles.  Cardiovascular:  Regular rate and rhythm, S1/S2, without murmur, rub or gallop.  There was no pedal edema.  GI:  abdomen was soft, flat, nontender, nondistended, without organomegaly.  Muscoloskeletal:  no spinal tenderness of palpation of vertebral spine.  Skin exam was without echymosis, petichae.  Neuro exam was nonfocal. Patient was alerted and oriented.  Attention was good.   Language was appropriate.  Mood was normal without depression.  Speech was not pressured.  Thought content was not tangential.     LABORATORY/RADIOLOGY DATA:  Lab Results  Component Value Date   WBC 8.5 04/30/2011   HGB  11.2* 04/30/2011   HCT 36.5 04/30/2011   PLT 372 04/30/2011   GLUCOSE 132* 04/30/2011   ALT 20 04/30/2011   AST 24 04/30/2011   NA 142 04/30/2011   K 4.3 04/30/2011   CL 104 04/30/2011   CREATININE 0.90 04/30/2011   BUN 11 04/30/2011   CO2 26 04/30/2011   INR 1.62* 04/07/2011   IMAGING:  PET scan on 04/27/2011 showed complete resolution of the mesenteric mass.  I personally reviewed the scan and showed the patient and her niece the images.   ASSESSMENT AND PLAN:   1. Non-Hodgkin lymphoma:  I discussed with [name] that she had complete radiographic resolution of her lymphoma.  It was not extremely bulky at presentation; however, she did have obstructive symptom with pain.  I referred her to Rad Onc for evaluation to see if consolidative radiation is appropriate.   2. Anemia:  Most likely from chemo.  This is improved since off of chemo. She denies evidence of active bleeding.  Will continue to monitor and consider pRBC transfusion in the future for Hgb <7.  3.  Hypothyroidism: She has levothyroxine per PCP. 4. Hypertension:  She is on diltiazem PCP.  She was on Lisinopril before chemo which was discontinued due to symptomatic hypotension.  Her BP now is still relatively low; I will not resume her Lisinopril at this time.  5. Diabetes mellitus, type II:  She is on metformin per PCP.   6. History of atrial fibrillation:  She is on diltiazem, and she has a pacemaker.  She is not on Coumadin for unclear reason, but probably due to her fall risk.  She follows with Dr. Ladona Ridgel. 7. Follow with me in clinic in 3 months.  Next surveillance CT is due in July 2013.   The length of time of the face-to-face encounter was 15 minutes. More than 50% of time was spent counseling and coordination of care.

## 2011-05-13 ENCOUNTER — Other Ambulatory Visit: Payer: Self-pay | Admitting: Family Medicine

## 2011-05-19 ENCOUNTER — Telehealth: Payer: Self-pay | Admitting: *Deleted

## 2011-05-19 NOTE — Telephone Encounter (Signed)
Call from pt's friend,  Danielle Dess, states she is on pt's HIPPA form.  This RN could not find her name on signed form.  She is asking for update about pt's condition.  Instructed her to call pt herself as she is capable of sharing her condition if she wishes and informed could not find her name on HIPPA form. She verbalized understanding.

## 2011-05-20 ENCOUNTER — Encounter: Payer: Self-pay | Admitting: Radiation Oncology

## 2011-05-20 ENCOUNTER — Ambulatory Visit
Admission: RE | Admit: 2011-05-20 | Discharge: 2011-05-20 | Disposition: A | Discharge status: Home / Self Care | Payer: Medicare Other | Source: Ambulatory Visit | Attending: Radiation Oncology | Admitting: Radiation Oncology

## 2011-05-20 VITALS — BP 105/57 | HR 85 | Temp 98.5°F | Resp 20 | Wt 215.2 lb

## 2011-05-20 DIAGNOSIS — C8330 Diffuse large B-cell lymphoma, unspecified site: Secondary | ICD-10-CM

## 2011-05-20 DIAGNOSIS — C8589 Other specified types of non-Hodgkin lymphoma, extranodal and solid organ sites: Secondary | ICD-10-CM | POA: Insufficient documentation

## 2011-05-20 DIAGNOSIS — Z510 Encounter for antineoplastic radiation therapy: Secondary | ICD-10-CM | POA: Insufficient documentation

## 2011-05-20 DIAGNOSIS — Z950 Presence of cardiac pacemaker: Secondary | ICD-10-CM | POA: Insufficient documentation

## 2011-05-20 DIAGNOSIS — Z7901 Long term (current) use of anticoagulants: Secondary | ICD-10-CM | POA: Insufficient documentation

## 2011-05-20 DIAGNOSIS — C8593 Non-Hodgkin lymphoma, unspecified, intra-abdominal lymph nodes: Secondary | ICD-10-CM

## 2011-05-20 DIAGNOSIS — K5900 Constipation, unspecified: Secondary | ICD-10-CM | POA: Insufficient documentation

## 2011-05-20 DIAGNOSIS — Z79899 Other long term (current) drug therapy: Secondary | ICD-10-CM | POA: Insufficient documentation

## 2011-05-20 HISTORY — DX: Unspecified osteoarthritis, unspecified site: M19.90

## 2011-05-20 HISTORY — DX: Non-hodgkin lymphoma, unspecified, intra-abdominal lymph nodes: C85.93

## 2011-05-20 HISTORY — DX: Personal history of other diseases of the circulatory system: Z86.79

## 2011-05-20 HISTORY — DX: Hypothyroidism, unspecified: E03.9

## 2011-05-20 HISTORY — DX: Personal history of antineoplastic chemotherapy: Z92.21

## 2011-05-20 HISTORY — DX: Presence of other vascular implants and grafts: Z95.828

## 2011-05-20 LAB — PACEMAKER DEVICE OBSERVATION
ATRIAL PACING PM: 35
BAMS-0001: 180 {beats}/min
BAMS-0003: 70 {beats}/min
BATTERY VOLTAGE: 2.78 V
DEVICE MODEL PM: 1777467
RV LEAD AMPLITUDE: 12 mv
RV LEAD THRESHOLD: 0.75 V
VENTRICULAR PACING PM: 1

## 2011-05-20 NOTE — Progress Notes (Signed)
[place] Visit today.  Chemotherapy completed in 12/12,  R- Chop x 6.  Denies any pain Accompanied by niece Renella Cunas.

## 2011-05-20 NOTE — Progress Notes (Signed)
Simulation. 3D treatment planning note and special treatment procedure note  The patient was taken to the CT simulator and laid in the supine position. An alpha cradle was made for the patient legs. High-resolution CT axial imaging was obtained of her abdomen and pelvis. I placed an isocenter within the right lower abdomen.  Treatment planning note: I plan to treat the patient with a 3 field arrangement using MLCs for custom blocks. I have ordered a PET fusion so that I can cover the patient's pre-chemotherapy tumor volume. I plan to treat the patient's pre-chemotherapy tumor volume while acknowledging dose to her bowel and kidneys. I am requesting a DVH of her right kidney left kidney bowel and target volume.  3-D conformal treatment planning note: as described above I am requesting a DVH to spare the patient's normal tissues. The patient's kidneys are in close proximity to her target volume. Additionally, I want to limit the amount of bowel that will be treated, so that her toxicity is minimized. 3-D conformal radiotherapy is an important modality to provide adequate dose to her disease while sparing her toxicity.  Special treatment procedure note: the patient has a pacemaker. We are getting approval from cardiology to treat her. Pacemaker is over 20 cm from her target volume. I am ordering a diode from physics to verify that the dose of the pacemaker is within acceptable limits.

## 2011-05-20 NOTE — Progress Notes (Signed)
Please see the Nurse Progress Note in the MD Initial Consult Encounter for this patient. 

## 2011-05-20 NOTE — Progress Notes (Signed)
Met with patient to discuss RO billing.  Patient had no concerns today. 

## 2011-05-20 NOTE — Progress Notes (Signed)
[facility] Radiation Oncology NEW PATIENT EVALUATION  Name: [name] MRN: [id number]  Date: 05/20/2011  DOB: 08-04-1940  Status: outpatient   CC:   Jethro Bolus, MD    REFERRING PHYSICIAN: Jethro Bolus, MD   DIAGNOSIS: The encounter diagnosis was Diffuse large B cell lymphoma. Stage IIA, abdominal     HISTORY OF PRESENT ILLNESS:  [name] is a 71 y.o. female who developed abdominal discomfort and gassiness and several months ago. She also had nausea. She presented to the emergency room and imaging of her abdomen revealed a mesenteric mass in the lower right quadrant. I have reviewed the patient's initial CT scan from 09/19/2010 which revealed a 5.5 x 6 cm irregular mass with small adjacent lymph nodes. The mass did not appear to be connected to hollow or solid  viscera.  She underwent a CT-guided biopsy on 10/07/2010 which revealed non-Hodgkins B-cell lymphoma. This was high grade felt to be consistent with diffuse large B-cell lymphoma. This was CD20 positive. She underwent a bone marrow biopsy which was negative. A PET scan scan on 11/25/2010, which I also reviewed, revealed a right lower quadrant mass was 6.4 x 7.5 cm at that time and hypermetabolic. There is also a an enlarging precaval hypermetabolic lymph node on this study. She did not have evidence of extra-abdominal disease.  In my assessment, the patient probably had stage IIA disease - with mesenteric and paraaortic disease. She did not have any night sweats fevers itching or significant weight loss at diagnosis. She went on to receive 6 cycles of R. CHOP and she tolerated this well. She had some diarrhea. Her most recent PET scan from 04/27/2011 demonstrates a complete response.  She is independent with her activities of daily living. She does use a scooter wheelchair to get around the house for severe arthritis in her knees. She is here with her niece today. The patient takes partial care of her disabled  son.   PREVIOUS RADIATION THERAPY: No  PAST MEDICAL HISTORY:  has a past medical history of Sick sinus syndrome; Hematoma; Hypotension; Dyslipidemia; Diabetes mellitus; Pacemaker; Diffuse large B cell lymphoma; Cancer (10/2010); History of atrial fibrillation; Arthritis; Hypothyroidism; Port-a-cath in place (12/08/10); and Status post chemotherapy (completed 03/2011).     PAST SURGICAL HISTORY:  Past Surgical History  Procedure Date  . Pacemaker insertion     Lead revision completed August 04, 2006  . Cholecystectomy   . Tubal ligation     Bilateral  . Insert / replace / remove pacemaker   . Abdominal hysterectomy   . Biopsy stomach 11/03/10    Soft Tissue Mass, Biopsy, Right Lower Quadrant Mesenteric Mass- High Grade Non-Hodgkins B Cell Lymphoma  with Flow Cytometry  . Bone biopsy 11/24/10    Bone Marrow, Aspirate Biospy, and clot, Left - No Involvement of Non-Hodgkin's Lymphoma Identified     FAMILY HISTORY: family history includes Aneurysm in her son; Cancer in her brother and sister; and Stroke in her father and mother.   SOCIAL HISTORY:  reports that she has quit smoking. Her smoking use included Cigarettes. She has a 45 pack-year smoking history. She has never used smokeless tobacco. She reports that she does not drink alcohol or use illicit drugs.   ALLERGIES: Codeine   MEDICATIONS:  Current Outpatient Prescriptions  Medication Sig Dispense Refill  . albuterol (PROVENTIL) (2.5 MG/3ML) 0.083% nebulizer solution Take 2.5 mg by nebulization every 6 (six) hours as needed.      Marland Kitchen atorvastatin (  LIPITOR) 20 MG tablet Take 20 mg by mouth daily.        . citalopram (CELEXA) 20 MG tablet Take 20 mg by mouth daily.        Marland Kitchen diltiazem (CARDIZEM CD) 180 MG 24 hr capsule Take 180 mg by mouth daily.        Marland Kitchen levothyroxine (SYNTHROID, LEVOTHROID) 100 MCG tablet Take 100 mcg by mouth daily.        . metFORMIN (GLUCOPHAGE) 500 MG tablet Take 1,000 mg by mouth. 2 in the morning and 2 tablet  at night      . warfarin (COUMADIN) 6 MG tablet Take 6 mg by mouth every evening.         REVIEW OF SYSTEMS:  As described above.    PHYSICAL EXAM:  weight is 215 lb 3.2 oz (97.614 kg). Her temperature is 98.5 F (36.9 C). Her blood pressure is 105/57 and her pulse is 85. Her respiration is 20.   General: Alert and oriented, obese, in no acute distress. She is sitting in a wheelchair. HEENT: Head is normocephalic. Pupils are equally round and reactive to light. Extraocular movements are intact. Oropharynx is clear. Neck: Neck is supple, no palpable cervical or supraclavicular lymphadenopathy. Heart: Regular in rate and rhythm  Chest: Clear to auscultation bilaterally, with no rhonchi, wheezes, or rales. She has a pacemaker palpable in her left upper chest and a Port-A-Cath palpable in her right upper chest Abdomen: Soft, nontender, nondistended, with no rigidity or guarding. Extremities: No cyanosis; there is nonpitting edema in her ankles.  Skin: No concerning lesions. Musculoskeletal: symmetric strength and muscle tone throughout. Neurologic: Cranial nerves II through XII are grossly intact. No obvious focalities. Speech is fluent. Coordination is intact. Psychiatric: Judgment and insight are intact. Affect is appropriate.    LABORATORY DATA:  Lab Results  Component Value Date   WBC 8.5 04/30/2011   HGB 11.2* 04/30/2011   HCT 36.5 04/30/2011   MCV 89.2 04/30/2011   PLT 372 04/30/2011   CMP     Component Value Date/Time   NA 142 04/30/2011 1329   K 4.3 04/30/2011 1329   CL 104 04/30/2011 1329   CO2 26 04/30/2011 1329   GLUCOSE 132* 04/30/2011 1329   BUN 11 04/30/2011 1329   CREATININE 0.90 04/30/2011 1329   CALCIUM 9.6 04/30/2011 1329   PROT 6.6 04/30/2011 1329   ALBUMIN 4.2 04/30/2011 1329   AST 24 04/30/2011 1329   ALT 20 04/30/2011 1329   ALKPHOS 51 04/30/2011 1329   BILITOT 0.4 04/30/2011 1329   GFRNONAA 63* 04/07/2011 0453   GFRAA 73* 04/07/2011 0453    PATHOLOGY: As  above   RADIOLOGY: As above    IMPRESSION/PLAN: This is a very pleasant 71 year old woman with stage IIA high-grade non-Hodgkin's B-cell lymphoma. This is thought to be consistent with diffuse large B-cell lymphoma and it was CD20 positive. She had a complete response to 6 cycles of R. CHOP chemotherapy. SHe finished this in December 2012.  I Explained to the patient that radiotherapy could decrease her risk of a regional recurrence. However based on randomized data, I am not convinced that it would improve her life expectancy. She is nevertheless interested in pursuing the radiotherapy for local regional control. I plan to treat her to 30.6 gray at 1.8 gray per fraction. I will focus on the right lower quadrant by fusing her PET scan to her CT simulation images. I will treat the area of pre-chemotherapy therapy tumor. If  I am able to treat the region of the precaval lymph node, I will do so, but not at the expense of exceeding kidney tolerance.  She has a pacemaker and therefore we will contact with her cardiologist to verify that it is safe to treat her with radiation. I do not think the pacemaker will receive any significant dose.  It was a pleasure meeting the patient today. We discussed the risks, benefits, and side effects of radiotherapy. No guarantees of treatment were given. A consent form was signed and placed in the patient's medical record. The patient is enthusiastic about proceeding with treatment. We will simulate her today. I look forward to participating in the patient's care.   I spent 60 minutes minutes face to face with the patient and more than 50% of that time was spent in counseling and/or coordination of care.

## 2011-05-22 NOTE — Progress Notes (Signed)
Encounter addended by: Delynn Flavin, RN on: 05/22/2011  4:34 PM<BR>     Documentation filed: Charges VN

## 2011-05-22 NOTE — Progress Notes (Signed)
Addendum to Treatment Planning NOTE:  The Patient will receive 30.6 Gy at 1.8 Gy per fraction.

## 2011-05-25 ENCOUNTER — Encounter: Payer: Self-pay | Admitting: *Deleted

## 2011-05-25 NOTE — Progress Notes (Signed)
[facility] Psychosocial Distress Screening Clinical Social Work  Clinical Social Work was referred by distress screening protocol.  The patient scored a 5 on the Psychosocial Distress Thermometer which indicates moderate distress. Clinical Social Worker spoke with the patient on the phone to assess for distress and other psychosocial needs.    Clinical Social Worker follow up needed: No.  If yes, follow up plan:

## 2011-05-27 ENCOUNTER — Ambulatory Visit
Admission: RE | Admit: 2011-05-27 | Discharge: 2011-05-27 | Disposition: A | Discharge status: Home / Self Care | Payer: Medicare Other | Source: Ambulatory Visit | Attending: Radiation Oncology | Admitting: Radiation Oncology

## 2011-05-27 ENCOUNTER — Encounter: Payer: Self-pay | Admitting: Radiation Oncology

## 2011-05-27 NOTE — Progress Notes (Signed)
Simulation verification note: The patient underwent simulation verification today for treatment to her abdomen/pelvis the management of her lymphoma. Her isocenter is in good position and the multileaf collimators contoured the treatment volume appropriately.

## 2011-05-28 ENCOUNTER — Ambulatory Visit
Admission: RE | Admit: 2011-05-28 | Discharge: 2011-05-28 | Disposition: A | Discharge status: Home / Self Care | Payer: Medicare Other | Source: Ambulatory Visit | Attending: Radiation Oncology | Admitting: Radiation Oncology

## 2011-05-29 ENCOUNTER — Ambulatory Visit
Admission: RE | Admit: 2011-05-29 | Discharge: 2011-05-29 | Disposition: A | Discharge status: Home / Self Care | Payer: Medicare Other | Source: Ambulatory Visit | Attending: Radiation Oncology | Admitting: Radiation Oncology

## 2011-06-01 ENCOUNTER — Ambulatory Visit
Admission: RE | Admit: 2011-06-01 | Discharge: 2011-06-01 | Disposition: A | Discharge status: Home / Self Care | Payer: Medicare Other | Source: Ambulatory Visit | Attending: Radiation Oncology | Admitting: Radiation Oncology

## 2011-06-02 ENCOUNTER — Ambulatory Visit
Admission: RE | Admit: 2011-06-02 | Discharge: 2011-06-02 | Disposition: A | Discharge status: Home / Self Care | Payer: Medicare Other | Source: Ambulatory Visit | Attending: Radiation Oncology | Admitting: Radiation Oncology

## 2011-06-03 ENCOUNTER — Ambulatory Visit
Admission: RE | Admit: 2011-06-03 | Discharge: 2011-06-03 | Disposition: A | Discharge status: Home / Self Care | Payer: Medicare Other | Source: Ambulatory Visit | Attending: Radiation Oncology | Admitting: Radiation Oncology

## 2011-06-03 ENCOUNTER — Encounter: Payer: Self-pay | Admitting: Radiation Oncology

## 2011-06-03 ENCOUNTER — Telehealth: Payer: Self-pay | Admitting: *Deleted

## 2011-06-03 DIAGNOSIS — C8593 Non-Hodgkin lymphoma, unspecified, intra-abdominal lymph nodes: Secondary | ICD-10-CM

## 2011-06-03 NOTE — Progress Notes (Signed)
   Weekly Management Note Current Dose:  900 cGy  Projected Dose: 3060  cGy   Narrative:  The patient presents for routine under treatment assessment.  CBCT/MVCT images/Port film x-rays were reviewed.  The chart was checked. She has had some diarrhea but has Imodium for this. She denies any nausea or vomiting. She is otherwise doing well.  Physical Findings: Weight:  . There were no vitals filed for this visit. she is sitting in a wheelchair in no acute distress.  CBC    Component Value Date/Time   WBC 8.5 04/30/2011 1329   WBC 6.6 04/07/2011 0453   RBC 4.09 04/30/2011 1329   RBC 3.06* 04/07/2011 0453   HGB 11.2* 04/30/2011 1329   HGB 9.3* 04/07/2011 0453   HCT 36.5 04/30/2011 1329   HCT 29.2* 04/07/2011 0453   PLT 372 04/30/2011 1329   PLT 109* 04/07/2011 0453   MCV 89.2 04/30/2011 1329   MCV 95.4 04/07/2011 0453   MCH 27.4 04/30/2011 1329   MCH 30.4 04/07/2011 0453   MCHC 30.7* 04/30/2011 1329   MCHC 31.8 04/07/2011 0453   RDW 16.7* 04/30/2011 1329   RDW 17.8* 04/07/2011 0453   LYMPHSABS 1.6 04/30/2011 1329   LYMPHSABS 0.3* 04/05/2011 1740   MONOABS 1.0* 04/30/2011 1329   MONOABS 1.2* 04/05/2011 1740   EOSABS 0.2 04/30/2011 1329   EOSABS 0.0 04/05/2011 1740   BASOSABS 0.1 04/30/2011 1329   BASOSABS 0.1 04/05/2011 1740    CMP     Component Value Date/Time   NA 142 04/30/2011 1329   K 4.3 04/30/2011 1329   CL 104 04/30/2011 1329   CO2 26 04/30/2011 1329   GLUCOSE 132* 04/30/2011 1329   BUN 11 04/30/2011 1329   CREATININE 0.90 04/30/2011 1329   CALCIUM 9.6 04/30/2011 1329   PROT 6.6 04/30/2011 1329   ALBUMIN 4.2 04/30/2011 1329   AST 24 04/30/2011 1329   ALT 20 04/30/2011 1329   ALKPHOS 51 04/30/2011 1329   BILITOT 0.4 04/30/2011 1329   GFRNONAA 63* 04/07/2011 0453   GFRAA 73* 04/07/2011 0453     Impression:  The patient is tolerating radiotherapy.  Plan:  Continue radiotherapy as planned.

## 2011-06-04 ENCOUNTER — Ambulatory Visit
Admission: RE | Admit: 2011-06-04 | Discharge: 2011-06-04 | Disposition: A | Discharge status: Home / Self Care | Payer: Medicare Other | Source: Ambulatory Visit | Attending: Radiation Oncology | Admitting: Radiation Oncology

## 2011-06-04 NOTE — Telephone Encounter (Signed)
error 

## 2011-06-05 ENCOUNTER — Ambulatory Visit: Payer: Medicare Other

## 2011-06-05 MED ORDER — RADIAPLEXRX EX GEL
Freq: Once | CUTANEOUS | Status: AC
  Administered 2011-06-03: 1 via TOPICAL

## 2011-06-05 NOTE — Progress Notes (Signed)
Encounter addended by: Delynn Flavin, RN on: 06/05/2011  6:22 PM<BR>     Documentation filed: Inpatient MAR, Orders

## 2011-06-08 ENCOUNTER — Ambulatory Visit
Admission: RE | Admit: 2011-06-08 | Discharge: 2011-06-08 | Disposition: A | Discharge status: Home / Self Care | Payer: Medicare Other | Source: Ambulatory Visit | Attending: Radiation Oncology | Admitting: Radiation Oncology

## 2011-06-08 ENCOUNTER — Encounter: Payer: Self-pay | Admitting: Radiation Oncology

## 2011-06-08 VITALS — Wt 212.9 lb

## 2011-06-08 DIAGNOSIS — C8593 Non-Hodgkin lymphoma, unspecified, intra-abdominal lymph nodes: Secondary | ICD-10-CM

## 2011-06-08 NOTE — Progress Notes (Signed)
   Weekly Management Note Current Dose:   1260cGy  Projected Dose: 3060  cGy   Narrative:  The patient presents for routine under treatment assessment.  CBCT/MVCT images/Port film x-rays were reviewed.  The chart was checked. She has no new complaints today. She denies any GI upset.  Physical Findings: Weight: 212 lb 14.4 oz (96.571 kg). She has no tenderness to palpation throughout her abdomen.  Impression:  The patient is tolerating radiotherapy.  Plan:  Continue radiotherapy as planned.

## 2011-06-08 NOTE — Progress Notes (Signed)
No c/o , says nothing has changed.

## 2011-06-09 ENCOUNTER — Ambulatory Visit: Payer: Medicare Other

## 2011-06-10 ENCOUNTER — Ambulatory Visit (HOSPITAL_BASED_OUTPATIENT_CLINIC_OR_DEPARTMENT_OTHER): Payer: Medicare Other

## 2011-06-10 ENCOUNTER — Ambulatory Visit
Admission: RE | Admit: 2011-06-10 | Discharge: 2011-06-10 | Disposition: A | Discharge status: Home / Self Care | Payer: Medicare Other | Source: Ambulatory Visit | Attending: Radiation Oncology | Admitting: Radiation Oncology

## 2011-06-10 DIAGNOSIS — Z469 Encounter for fitting and adjustment of unspecified device: Secondary | ICD-10-CM

## 2011-06-10 DIAGNOSIS — C8589 Other specified types of non-Hodgkin lymphoma, extranodal and solid organ sites: Secondary | ICD-10-CM

## 2011-06-10 DIAGNOSIS — C8590 Non-Hodgkin lymphoma, unspecified, unspecified site: Secondary | ICD-10-CM

## 2011-06-10 MED ORDER — SODIUM CHLORIDE 0.9 % IJ SOLN
10.0000 mL | INTRAMUSCULAR | Status: DC | PRN
  Administered 2011-06-10: 10 mL via INTRAVENOUS
  Filled 2011-06-10: qty 10

## 2011-06-10 MED ORDER — HEPARIN SOD (PORK) LOCK FLUSH 100 UNIT/ML IV SOLN
500.0000 [IU] | Freq: Once | INTRAVENOUS | Status: AC
  Administered 2011-06-10: 500 [IU] via INTRAVENOUS
  Filled 2011-06-10: qty 5

## 2011-06-11 ENCOUNTER — Ambulatory Visit
Admission: RE | Admit: 2011-06-11 | Discharge: 2011-06-11 | Disposition: A | Discharge status: Home / Self Care | Payer: Medicare Other | Source: Ambulatory Visit | Attending: Radiation Oncology | Admitting: Radiation Oncology

## 2011-06-12 ENCOUNTER — Ambulatory Visit: Payer: Medicare Other

## 2011-06-15 ENCOUNTER — Encounter: Payer: Self-pay | Admitting: Radiation Oncology

## 2011-06-15 ENCOUNTER — Ambulatory Visit
Admission: RE | Admit: 2011-06-15 | Discharge: 2011-06-15 | Disposition: A | Discharge status: Home / Self Care | Payer: Medicare Other | Source: Ambulatory Visit | Attending: Radiation Oncology | Admitting: Radiation Oncology

## 2011-06-15 VITALS — BP 105/71 | HR 69 | Resp 18 | Wt 208.4 lb

## 2011-06-15 DIAGNOSIS — C8593 Non-Hodgkin lymphoma, unspecified, intra-abdominal lymph nodes: Secondary | ICD-10-CM

## 2011-06-15 NOTE — Progress Notes (Signed)
Received patient in the clinic today for under treat visit with Dr. Basilio Cairo. Patient accompanied by her niece. Patient is alert and oriented to person, place, and time. No distress noted. Patient being pushed in wheelchair due to generalized weakness. Patient stood without difficulty on scale. Flat affect noted. Patient denies pain at this time. Patient reports frequent episodes of nausea and vomiting. Patient reports that she had diarrhea last week took imodium and it resolved. Patient unsure of the last time she had a bowel movement. Patient has no other complaints. Reported all findings to Dr. Basilio Cairo.

## 2011-06-15 NOTE — Progress Notes (Signed)
   Weekly Management Note Current Dose:   1800 cGy  Projected Dose: 3060 cGy   Narrative:  The patient presents for routine under treatment assessment.  CBCT/MVCT images/Port film x-rays were reviewed.  The chart was checked. She is doing relatively well. She has been constipated since taking Imodium about 5 days ago for a single episode of diarrhea. She reports nausea that is well-controlled with an antiemetic but she is not sure what that anti-emetic is. She had infrequent vomiting at the end of last week. Overall, she is not that uncomfortable. She is eating and drinking well.  Physical Findings: Weight: 208 lb 6.4 oz (94.53 kg). She is sitting comfortably in a wheelchair. Her abdomen is soft nontender nondistended with no rigidity or guarding. Bowel sounds are normoactive.  Impression:  The patient is tolerating radiotherapy.  Plan:  Continue radiotherapy as planned. I advised her to take Senokot for her constipation. She will continue her antiemetic. I encouraged to continue good by mouth intake.

## 2011-06-16 ENCOUNTER — Ambulatory Visit
Admission: RE | Admit: 2011-06-16 | Discharge: 2011-06-16 | Disposition: A | Discharge status: Home / Self Care | Payer: Medicare Other | Source: Ambulatory Visit | Attending: Radiation Oncology | Admitting: Radiation Oncology

## 2011-06-17 ENCOUNTER — Ambulatory Visit
Admission: RE | Admit: 2011-06-17 | Discharge: 2011-06-17 | Disposition: A | Discharge status: Home / Self Care | Payer: Medicare Other | Source: Ambulatory Visit | Attending: Radiation Oncology | Admitting: Radiation Oncology

## 2011-06-17 ENCOUNTER — Telehealth: Payer: Self-pay | Admitting: Internal Medicine

## 2011-06-17 NOTE — Telephone Encounter (Signed)
Pt calling re being on diltiazem, got refill Monday, took last pill yesterday, so when she opened it this am to take the refill, it's a smaller pill, about half the size and the bottle states it for BP and as far and she knows she didn't have a problem with her BP, so she wants to make sure that's she's to be on dilitazem and if so, should she take the refill being that is different? Can be reach until 1030a or after 1230p has a chemo treatment, pls call

## 2011-06-17 NOTE — Telephone Encounter (Signed)
Okay to take it is just a different brand of Diltiazem but still 180mg  daily

## 2011-06-18 ENCOUNTER — Ambulatory Visit: Payer: Medicare Other

## 2011-06-19 ENCOUNTER — Ambulatory Visit
Admission: RE | Admit: 2011-06-19 | Discharge: 2011-06-19 | Disposition: A | Discharge status: Home / Self Care | Payer: Medicare Other | Source: Ambulatory Visit | Attending: Radiation Oncology | Admitting: Radiation Oncology

## 2011-06-22 ENCOUNTER — Ambulatory Visit
Admission: RE | Admit: 2011-06-22 | Discharge: 2011-06-22 | Disposition: A | Discharge status: Home / Self Care | Payer: Medicare Other | Source: Ambulatory Visit | Attending: Radiation Oncology | Admitting: Radiation Oncology

## 2011-06-22 ENCOUNTER — Ambulatory Visit: Payer: Medicare Other

## 2011-06-22 ENCOUNTER — Encounter: Payer: Self-pay | Admitting: Radiation Oncology

## 2011-06-22 VITALS — BP 107/69 | HR 77 | Temp 98.3°F | Wt 211.9 lb

## 2011-06-22 DIAGNOSIS — C8593 Non-Hodgkin lymphoma, unspecified, intra-abdominal lymph nodes: Secondary | ICD-10-CM

## 2011-06-22 NOTE — Progress Notes (Signed)
   Weekly Management Note Current Dose:  2520 cGy  Projected Dose: 3060 cGy   Narrative:  The patient presents for routine under treatment assessment.  CBCT/MVCT images/Port film x-rays were reviewed.  The chart was checked. She is doing well. She denies any GI upset. She has no complaints today.  Physical Findings: Weight: 211 lb 14.4 oz (96.117 kg). She is sitting comfortably in a chair in no acute distress. She has no skin changes over her abdomen. Her abdomen is soft nontender and nondistended with no rigidity or guarding.  Impression:  The patient is tolerating radiotherapy.  Plan:  Continue radiotherapy as planned.

## 2011-06-22 NOTE — Progress Notes (Signed)
Denies any pain , nausea, nor lack of appetite.  Has gained 2 lbs since 06/15/10.

## 2011-06-23 ENCOUNTER — Ambulatory Visit: Payer: Medicare Other

## 2011-06-24 ENCOUNTER — Ambulatory Visit: Payer: Medicare Other

## 2011-06-24 ENCOUNTER — Ambulatory Visit
Admission: RE | Admit: 2011-06-24 | Discharge: 2011-06-24 | Disposition: A | Discharge status: Home / Self Care | Payer: Medicare Other | Source: Ambulatory Visit | Attending: Radiation Oncology | Admitting: Radiation Oncology

## 2011-06-25 ENCOUNTER — Ambulatory Visit: Payer: Medicare Other

## 2011-06-25 ENCOUNTER — Ambulatory Visit
Admission: RE | Admit: 2011-06-25 | Discharge: 2011-06-25 | Disposition: A | Discharge status: Home / Self Care | Payer: Medicare Other | Source: Ambulatory Visit | Attending: Radiation Oncology | Admitting: Radiation Oncology

## 2011-06-26 ENCOUNTER — Ambulatory Visit
Admission: RE | Admit: 2011-06-26 | Discharge: 2011-06-26 | Disposition: A | Discharge status: Home / Self Care | Payer: Medicare Other | Source: Ambulatory Visit | Attending: Radiation Oncology | Admitting: Radiation Oncology

## 2011-06-26 ENCOUNTER — Ambulatory Visit: Payer: Medicare Other

## 2011-06-26 ENCOUNTER — Encounter: Payer: Self-pay | Admitting: Radiation Oncology

## 2011-06-29 ENCOUNTER — Ambulatory Visit: Payer: Medicare Other

## 2011-06-30 ENCOUNTER — Ambulatory Visit: Payer: Medicare Other

## 2011-07-01 ENCOUNTER — Ambulatory Visit: Payer: Medicare Other

## 2011-07-14 NOTE — Progress Notes (Signed)
[facility] Radiation Oncology End of Treatment Note  Name:[name]  Date: 06/26/2011 ZOX:[id number] DOB:[birth year]   DIAGNOSIS: Diffuse large B-cell lymphoma of the abdomen, stage IIA    TREATMENT DATES:  05/28/11 -  06/26/2011                         SITE/DOSE:    Right abdomen and right pelvis/3060 cGy in 17 fractions             BEAMS/ENERGY:      3-D conformal  Plan with AP/PA, and R lat fields   /  10 MV photons             NARRATIVE: The patient tolerated the treatment very well without any significant complaints                           PLAN: Routine followup in one month. Patient instructed to call if questions or worsening complaints in interim.

## 2011-07-22 ENCOUNTER — Encounter: Payer: Self-pay | Admitting: Radiation Oncology

## 2011-07-24 ENCOUNTER — Ambulatory Visit: Payer: Medicare Other | Admitting: Radiation Oncology

## 2011-07-29 ENCOUNTER — Telehealth: Payer: Self-pay | Admitting: Oncology

## 2011-07-29 ENCOUNTER — Other Ambulatory Visit (HOSPITAL_BASED_OUTPATIENT_CLINIC_OR_DEPARTMENT_OTHER): Payer: Medicare Other | Admitting: Lab

## 2011-07-29 ENCOUNTER — Ambulatory Visit (HOSPITAL_BASED_OUTPATIENT_CLINIC_OR_DEPARTMENT_OTHER): Payer: Medicare Other | Admitting: Oncology

## 2011-07-29 VITALS — BP 108/65 | HR 77 | Temp 97.3°F | Ht 59.0 in | Wt 211.9 lb

## 2011-07-29 DIAGNOSIS — C8593 Non-Hodgkin lymphoma, unspecified, intra-abdominal lymph nodes: Secondary | ICD-10-CM

## 2011-07-29 DIAGNOSIS — C8599 Non-Hodgkin lymphoma, unspecified, extranodal and solid organ sites: Secondary | ICD-10-CM

## 2011-07-29 DIAGNOSIS — C8590 Non-Hodgkin lymphoma, unspecified, unspecified site: Secondary | ICD-10-CM

## 2011-07-29 DIAGNOSIS — I4891 Unspecified atrial fibrillation: Secondary | ICD-10-CM

## 2011-07-29 DIAGNOSIS — C8583 Other specified types of non-Hodgkin lymphoma, intra-abdominal lymph nodes: Secondary | ICD-10-CM

## 2011-07-29 LAB — COMPREHENSIVE METABOLIC PANEL
AST: 16 U/L (ref 0–37)
Albumin: 3.9 g/dL (ref 3.5–5.2)
BUN: 13 mg/dL (ref 6–23)
Calcium: 9.1 mg/dL (ref 8.4–10.5)
Chloride: 102 mEq/L (ref 96–112)
Potassium: 4.1 mEq/L (ref 3.5–5.3)
Sodium: 140 mEq/L (ref 135–145)
Total Protein: 6.4 g/dL (ref 6.0–8.3)

## 2011-07-29 LAB — CBC WITH DIFFERENTIAL/PLATELET
BASO%: 1.2 % (ref 0.0–2.0)
Basophils Absolute: 0.1 10*3/uL (ref 0.0–0.1)
HCT: 35.4 % (ref 34.8–46.6)
HGB: 11.8 g/dL (ref 11.6–15.9)
MCHC: 33.2 g/dL (ref 31.5–36.0)
MONO#: 0.8 10*3/uL (ref 0.1–0.9)
NEUT%: 55.3 % (ref 38.4–76.8)
RDW: 17.1 % — ABNORMAL HIGH (ref 11.2–14.5)
WBC: 4.4 10*3/uL (ref 3.9–10.3)
lymph#: 1 10*3/uL (ref 0.9–3.3)

## 2011-07-29 MED ORDER — SODIUM CHLORIDE 0.9 % IJ SOLN
10.0000 mL | INTRAMUSCULAR | Status: DC | PRN
  Administered 2011-07-29: 10 mL via INTRAVENOUS
  Filled 2011-07-29: qty 10

## 2011-07-29 MED ORDER — HEPARIN SOD (PORK) LOCK FLUSH 100 UNIT/ML IV SOLN
500.0000 [IU] | Freq: Once | INTRAVENOUS | Status: AC
  Administered 2011-07-29: 500 [IU] via INTRAVENOUS
  Filled 2011-07-29: qty 5

## 2011-07-29 NOTE — Telephone Encounter (Signed)
Gave pt appt for May,July calendar appt, pt having CT on 10/12/11 lab, flush and CT she will see KC on 10/13/11, npo after 7am. Pt given oral contrast

## 2011-07-29 NOTE — Progress Notes (Signed)
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  PASTTHERAPY:   q3wk R-CHOP x6 cycles; finished in Dec 2012.   CURRENT THERAPY:  Pending evaluation for consolidative radiation.   INTERVAL HISTORY: [name] 71 y.o. female returns for regular follow up by herself.  She has chronic back pain and thus she is wheel-chair bound.  She has had normal appetite with normal weight.  She denies abdominal pain, constipation, diarrhea, visible source of bleeding, palpable node swelling.   Patient denies fatigue, headache, visual changes, confusion, drenching night sweats, mucositis, odynophagia, dysphagia, nausea vomiting, jaundice, chest pain, palpitation, shortness of breath, dyspnea on exertion, productive cough, gum bleeding, epistaxis, hematemesis, hemoptysis, abdominal pain, abdominal swelling, early satiety, melena, hematochezia, hematuria, skin rash, spontaneous bleeding, joint swelling, joint pain, heat or cold intolerance, bowel bladder incontinence,focal motor weakness, paresthesia, depression, suicidal or homocidal ideation, feeling hopelessness.    MEDICAL HISTORY: Past Medical History  Diagnosis Date  . Sick sinus syndrome   . Hematoma     At the site of the pacemaker insertion.  . Hypotension   . Dyslipidemia   . Diabetes mellitus   . Pacemaker   . Diffuse large B cell lymphoma   . Cancer 10/2010    large B cell lymphoma  . History of atrial fibrillation   . Arthritis     Osteoarthritis  . Hypothyroidism   . Port-a-cath in place 12/08/10  . Status post chemotherapy completed 03/2011    R - CHOP q 3 weeks x 6  . S/P radiation therapy 05/28/2011 - 06/26/2011    Right Abdomen and Right Pelvis/3060 cGy in 17 Fractions    SURGICAL HISTORY:  Past Surgical History  Procedure Date  . Pacemaker insertion     Lead revision completed August 04, 2006  . Cholecystectomy   . Tubal ligation     Bilateral  . Insert /  replace / remove pacemaker   . Abdominal hysterectomy   . Biopsy stomach 11/03/10    Soft Tissue Mass, Biopsy, Right Lower Quadrant Mesenteric Mass- High Grade Non-Hodgkins B Cell Lymphoma  with Flow Cytometry  . Bone biopsy 11/24/10    Bone Marrow, Aspirate Biospy, and clot, Left - No Involvement of Non-Hodgkin's Lymphoma Identified    MEDICATIONS: Current Outpatient Prescriptions  Medication Sig Dispense Refill  . albuterol (PROVENTIL) (2.5 MG/3ML) 0.083% nebulizer solution Take 2.5 mg by nebulization every 6 (six) hours as needed.      . Calcium Carbonate-Vitamin D (CALCIUM-VITAMIN D) 500-200 MG-UNIT per tablet Take 1 tablet by mouth 2 (two) times daily with a meal.      . citalopram (CELEXA) 20 MG tablet Take 20 mg by mouth daily.        Marland Kitchen diltiazem (CARDIZEM CD) 180 MG 24 hr capsule Take 180 mg by mouth daily.        Marland Kitchen levothyroxine (SYNTHROID, LEVOTHROID) 100 MCG tablet Take 100 mcg by mouth daily.        . metFORMIN (GLUCOPHAGE) 500 MG tablet Take 1,000 mg by mouth. 2 in the morning and 2 tablet at night      . pravastatin (PRAVACHOL) 20 MG tablet Take 40 mg by mouth daily.       Marland Kitchen warfarin (COUMADIN) 6 MG tablet Take 6 mg by mouth every evening. 1/1/2 tabs  Tues, Wed, Friday.  1 Tab Thur., Sat., Sunday      . ergocalciferol (DRISDOL) 8000 UNIT/ML drops Take 4,000 Units by  mouth daily.       Current Facility-Administered Medications  Medication Dose Route Frequency Provider Last Rate Last Dose  . heparin lock flush 100 unit/mL  500 Units Intravenous Once Exie Parody, MD   500 Units at 07/29/11 1406  . sodium chloride 0.9 % injection 10 mL  10 mL Intravenous PRN Exie Parody, MD   10 mL at 07/29/11 1407    ALLERGIES:  is allergic to codeine.  REVIEW OF SYSTEMS:  The rest of the 14-point review of system was negative.   Filed Vitals:   07/29/11 1337  BP: 108/65  Pulse: 77  Temp: 97.3 F (36.3 C)   Wt Readings from Last 3 Encounters:  07/29/11 211 lb 14.4 oz (96.117 kg)  06/22/11  211 lb 14.4 oz (96.117 kg)  06/15/11 208 lb 6.4 oz (94.53 kg)   ECOG Performance status: 1 due to her morbid obesity and osteoarthritis.   PHYSICAL EXAMINATION:   General:  Morbidly obese woman in no acute distress.  Eyes:  no scleral icterus.  ENT:  There were no oropharyngeal lesions.  Neck was without thyromegaly.  Lymphatics:  Negative cervical, supraclavicular or axillary adenopathy.  Respiratory: lungs were clear bilaterally without wheezing or crackles.  Cardiovascular:  Regular rate and rhythm, S1/S2, without murmur, rub or gallop.  There was no pedal edema.  GI:  abdomen was soft, flat, nontender, nondistended, without organomegaly.  Muscoloskeletal:  no spinal tenderness of palpation of vertebral spine.  Skin exam was without echymosis, petichae.  Neuro exam was nonfocal. Patient was alerted and oriented.  Attention was good.   Language was appropriate.  Mood was normal without depression.  Speech was not pressured.  Thought content was not tangential.     LABORATORY/RADIOLOGY DATA:  Lab Results  Component Value Date   WBC 4.4 07/29/2011   HGB 11.8 07/29/2011   HCT 35.4 07/29/2011   PLT 275 07/29/2011   GLUCOSE 132* 04/30/2011   ALT 20 04/30/2011   AST 24 04/30/2011   NA 142 04/30/2011   K 4.3 04/30/2011   CL 104 04/30/2011   CREATININE 0.90 04/30/2011   BUN 11 04/30/2011   CO2 26 04/30/2011   INR 1.62* 04/07/2011     ASSESSMENT AND PLAN:   1. Non-Hodgkin lymphoma: Clinically, she continues to be in remission.   For followup we'll obtain a CT scan of the chest, abdomen, pelvis in about 3 months. 2. Anemia:  From past chemo.  It has resolved.  3.  Hypothyroidism: She has levothyroxine per PCP. 4. Hypertension:  She is on diltiazem PCP with good blood pressure control. 5. Diabetes mellitus, type II:  She is on metformin per PCP.   6. Hyperlipidemia:  She is on pravastatin per PCP.  7. History of atrial fibrillation:  She is on diltiazem, and she has a pacemaker.  She is now on  Coumadin. 8. Follow with me in clinic in 3 months.  Next surveillance CT is due in July 2013.   The length of time of the face-to-face encounter was 15 minutes. More than 50% of time was spent counseling and coordination of care.

## 2011-07-31 NOTE — Progress Notes (Unsigned)
Received labs from [facility]; forwarded to Dr. Gaylyn Rong.

## 2011-08-17 ENCOUNTER — Ambulatory Visit
Admission: RE | Admit: 2011-08-17 | Discharge: 2011-08-17 | Disposition: A | Discharge status: Home / Self Care | Payer: Medicare Other | Source: Ambulatory Visit | Attending: Radiation Oncology | Admitting: Radiation Oncology

## 2011-08-17 ENCOUNTER — Encounter: Payer: Self-pay | Admitting: Radiation Oncology

## 2011-08-17 VITALS — BP 113/73 | HR 83 | Temp 98.7°F | Wt 214.3 lb

## 2011-08-17 DIAGNOSIS — C8593 Non-Hodgkin lymphoma, unspecified, intra-abdominal lymph nodes: Secondary | ICD-10-CM

## 2011-08-17 NOTE — Progress Notes (Signed)
Radiation Oncology         [phone] ________________________________  Name: [name] MRN: [id number]  Date: 08/17/2011  DOB: March 25, 1941  Follow-Up Visit Note  Diagnosis:   Stage IIa diffuse large B-cell lymphoma of the abdomen  Interval Since Last Radiation: She completed 3060 cGy in 17 fractions to  the right abdomen right pelvis on 06/26/2011  Narrative:  The patient returns today for routine follow-up.  She is doing well. She denies nausea vomiting or abdominal pain. She denies night sweats or fevers. No weight loss. She has plans for CT imaging on July 1. She is doing well from medical oncology's standpoint.     She continues to have chronic knee pain with difficulty ambulating and is in a wheelchair today.               ALLERGIES:  is allergic to codeine.  Meds: Current Outpatient Prescriptions  Medication Sig Dispense Refill  . albuterol (PROVENTIL) (2.5 MG/3ML) 0.083% nebulizer solution Take 2.5 mg by nebulization every 6 (six) hours as needed.      . Calcium Carbonate-Vitamin D (CALCIUM-VITAMIN D) 500-200 MG-UNIT per tablet Take 1 tablet by mouth 2 (two) times daily with a meal.      . citalopram (CELEXA) 20 MG tablet Take 20 mg by mouth daily.        Marland Kitchen diltiazem (CARDIZEM CD) 180 MG 24 hr capsule Take 180 mg by mouth daily.        . ergocalciferol (DRISDOL) 8000 UNIT/ML drops Take 4,000 Units by mouth daily.      Marland Kitchen levothyroxine (SYNTHROID, LEVOTHROID) 100 MCG tablet Take 100 mcg by mouth daily.        . metFORMIN (GLUCOPHAGE) 500 MG tablet Take 1,000 mg by mouth. 2 in the morning and 2 tablet at night      . pravastatin (PRAVACHOL) 20 MG tablet Take 40 mg by mouth daily.       Marland Kitchen warfarin (COUMADIN) 6 MG tablet Take 6 mg by mouth every evening. 1/1/2 tabs  Tues, Wed, Friday.  1 Tab Thur., Sat., Sunday        Physical Findings: The patient is in no acute distress. Patient is alert and oriented.  weight is 214 lb 4.8 oz (97.206 kg). Her temperature is 98.7 F (37.1  C). Her blood pressure is 113/73 and her pulse is 83. .   She is in a wheelchair. She is in no acute distress. Abdominal exam demonstrates no distention, rigidity or tenderness to palpation. Bowel sounds are normoactive.  Lab Findings: Lab Results  Component Value Date   WBC 4.4 07/29/2011   HGB 11.8 07/29/2011   HCT 35.4 07/29/2011   MCV 83.0 07/29/2011   PLT 275 07/29/2011    CMP     Component Value Date/Time   NA 140 07/29/2011 1314   K 4.1 07/29/2011 1314   CL 102 07/29/2011 1314   CO2 28 07/29/2011 1314   GLUCOSE 87 07/29/2011 1314   BUN 13 07/29/2011 1314   CREATININE 0.87 07/29/2011 1314   CALCIUM 9.1 07/29/2011 1314   PROT 6.4 07/29/2011 1314   ALBUMIN 3.9 07/29/2011 1314   AST 16 07/29/2011 1314   ALT 14 07/29/2011 1314   ALKPHOS 63 07/29/2011 1314   BILITOT 0.3 07/29/2011 1314   GFRNONAA 63* 04/07/2011 0453   GFRAA 73* 04/07/2011 0453      Radiographic Findings: No results found.  Impression:  The patient is recovering from the effects of radiation.  No evidence of disease  Plan:  She will undergo CT imaging on July 1. I will see her back on a when necessary basis. She'll see medical oncology to discuss the results of her imaging. I told her not to hesitate to call if she needs to be worked into my schedule at any time. -----------------------------------  Lonie Peak, MD

## 2011-08-17 NOTE — Progress Notes (Signed)
Ms Radu denies any pain today.  Eating well  Seen by Dr. Gaylyn Rong on 07/29/11.  Will a CT scan  On July 1st.  Travel by wheelchair.

## 2011-08-20 ENCOUNTER — Emergency Department (HOSPITAL_COMMUNITY)
Admission: EM | Admit: 2011-08-20 | Discharge: 2011-08-20 | Disposition: A | Discharge status: Home / Self Care | Payer: Medicare Other | Attending: Emergency Medicine | Admitting: Emergency Medicine

## 2011-08-20 ENCOUNTER — Encounter (HOSPITAL_COMMUNITY): Payer: Self-pay | Admitting: Emergency Medicine

## 2011-08-20 ENCOUNTER — Emergency Department (HOSPITAL_COMMUNITY): Payer: Medicare Other

## 2011-08-20 DIAGNOSIS — E119 Type 2 diabetes mellitus without complications: Secondary | ICD-10-CM | POA: Insufficient documentation

## 2011-08-20 DIAGNOSIS — R51 Headache: Secondary | ICD-10-CM | POA: Insufficient documentation

## 2011-08-20 DIAGNOSIS — I10 Essential (primary) hypertension: Secondary | ICD-10-CM | POA: Insufficient documentation

## 2011-08-20 DIAGNOSIS — Z7901 Long term (current) use of anticoagulants: Secondary | ICD-10-CM | POA: Insufficient documentation

## 2011-08-20 DIAGNOSIS — S0101XA Laceration without foreign body of scalp, initial encounter: Secondary | ICD-10-CM

## 2011-08-20 DIAGNOSIS — Z87898 Personal history of other specified conditions: Secondary | ICD-10-CM | POA: Insufficient documentation

## 2011-08-20 DIAGNOSIS — S0990XA Unspecified injury of head, initial encounter: Secondary | ICD-10-CM | POA: Insufficient documentation

## 2011-08-20 DIAGNOSIS — S0100XA Unspecified open wound of scalp, initial encounter: Secondary | ICD-10-CM | POA: Insufficient documentation

## 2011-08-20 DIAGNOSIS — Z950 Presence of cardiac pacemaker: Secondary | ICD-10-CM | POA: Insufficient documentation

## 2011-08-20 DIAGNOSIS — W07XXXA Fall from chair, initial encounter: Secondary | ICD-10-CM

## 2011-08-20 DIAGNOSIS — W050XXA Fall from non-moving wheelchair, initial encounter: Secondary | ICD-10-CM | POA: Insufficient documentation

## 2011-08-20 DIAGNOSIS — M542 Cervicalgia: Secondary | ICD-10-CM | POA: Insufficient documentation

## 2011-08-20 MED ORDER — ACETAMINOPHEN 325 MG PO TABS: 650.0000 mg | ORAL_TABLET | Freq: Once | ORAL | Status: DC
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
## 2011-08-20 NOTE — ED Notes (Signed)
Patient transported to CT 
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
## 2011-08-20 NOTE — Discharge Instructions (Signed)
Read the information below.  Keep your wound clean and covered with a thin layer of antibiotic ointment on it.  Please see your doctor, the urgent care, or the ER in 5 days for wound recheck and suture removal.  Return to the ER immediately if you develop redness, swelling, pus draining from the wound, or fevers greater than 100.4.  You may return to the ER at any time for worsening condition or any new symptoms that concern you.   Laceration Care, Adult A laceration is a cut or lesion that goes through all layers of the skin and into the tissue just beneath the skin. TREATMENT  Some lacerations may not require closure. Some lacerations may not be able to be closed due to an increased risk of infection. It is important to see your caregiver as soon as possible after an injury to minimize the risk of infection and maximize the opportunity for successful closure. If closure is appropriate, pain medicines may be given, if needed. The wound will be cleaned to help prevent infection. Your caregiver will use stitches (sutures), staples, wound glue (adhesive), or skin adhesive strips to repair the laceration. These tools bring the skin edges together to allow for faster healing and a better cosmetic outcome. However, all wounds will heal with a scar. Once the wound has healed, scarring can be minimized by covering the wound with sunscreen during the day for 1 full year. HOME CARE INSTRUCTIONS  For sutures or staples:  Keep the wound clean and dry.   If you were given a bandage (dressing), you should change it at least once a day. Also, change the dressing if it becomes wet or dirty, or as directed by your caregiver.   Wash the wound with soap and water 2 times a day. Rinse the wound off with water to remove all soap. Pat the wound dry with a clean towel.   After cleaning, apply a thin layer of the antibiotic ointment as recommended by your caregiver. This will help prevent infection and keep the dressing  from sticking.   You may shower as usual after the first 24 hours. Do not soak the wound in water until the sutures are removed.   Only take over-the-counter or prescription medicines for pain, discomfort, or fever as directed by your caregiver.   Get your sutures or staples removed as directed by your caregiver.  For skin adhesive strips:  Keep the wound clean and dry.   Do not get the skin adhesive strips wet. You may bathe carefully, using caution to keep the wound dry.   If the wound gets wet, pat it dry with a clean towel.   Skin adhesive strips will fall off on their own. You may trim the strips as the wound heals. Do not remove skin adhesive strips that are still stuck to the wound. They will fall off in time.  For wound adhesive:  You may briefly wet your wound in the shower or bath. Do not soak or scrub the wound. Do not swim. Avoid periods of heavy perspiration until the skin adhesive has fallen off on its own. After showering or bathing, gently pat the wound dry with a clean towel.   Do not apply liquid medicine, cream medicine, or ointment medicine to your wound while the skin adhesive is in place. This may loosen the film before your wound is healed.   If a dressing is placed over the wound, be careful not to apply tape directly over the  skin adhesive. This may cause the adhesive to be pulled off before the wound is healed.   Avoid prolonged exposure to sunlight or tanning lamps while the skin adhesive is in place. Exposure to ultraviolet light in the first year will darken the scar.   The skin adhesive will usually remain in place for 5 to 10 days, then naturally fall off the skin. Do not pick at the adhesive film.  You may need a tetanus shot if:  You cannot remember when you had your last tetanus shot.   You have never had a tetanus shot.  If you get a tetanus shot, your arm may swell, get red, and feel warm to the touch. This is common and not a problem. If you need a  tetanus shot and you choose not to have one, there is a rare chance of getting tetanus. Sickness from tetanus can be serious. SEEK MEDICAL CARE IF:   You have redness, swelling, or increasing pain in the wound.   You see a red line that goes away from the wound.   You have yellowish-white fluid (pus) coming from the wound.   You have a fever.   You notice a bad smell coming from the wound or dressing.   Your wound breaks open before or after sutures have been removed.   You notice something coming out of the wound such as wood or glass.   Your wound is on your hand or foot and you cannot move a finger or toe.  SEEK IMMEDIATE MEDICAL CARE IF:   Your pain is not controlled with prescribed medicine.   You have severe swelling around the wound causing pain and numbness or a change in color in your arm, hand, leg, or foot.   Your wound splits open and starts bleeding.   You have worsening numbness, weakness, or loss of function of any joint around or beyond the wound.   You develop painful lumps near the wound or on the skin anywhere on your body.  MAKE SURE YOU:   Understand these instructions.   Will watch your condition.   Will get help right away if you are not doing well or get worse.  Document Released: 03/30/2005 Document Revised: 03/19/2011 Document Reviewed: 09/23/2010 [facility] Patient Information 2012 [place], [place].  Head Injury, Adult You have had a head injury that does not appear serious at this time. A concussion is a state of changed mental ability, usually from a blow to the head. You should take clear liquids for the rest of the day and then resume your regular diet. You should not take sedatives or alcoholic beverages for as long as directed by your caregiver after discharge. After injuries such as yours, most problems occur within the first 24 hours. SYMPTOMS These minor symptoms may be experienced after discharge:  Memory difficulties.   Dizziness.    Headaches.   Double vision.   Hearing difficulties.   Depression.   Tiredness.   Weakness.   Difficulty with concentration.  If you experience any of these problems, you should not be alarmed. A concussion requires a few days for recovery. Many patients with head injuries frequently experience such symptoms. Usually, these problems disappear without medical care. If symptoms last for more than one day, notify your caregiver. See your caregiver sooner if symptoms are becoming worse rather than better. HOME CARE INSTRUCTIONS   During the next 24 hours you must stay with someone who can watch you for the warning signs listed  below.  Although it is unlikely that serious side effects will occur, you should be aware of signs and symptoms which may necessitate your return to this location. Side effects may occur up to 7 - 10 days following the injury. It is important for you to carefully monitor your condition and contact your caregiver or seek immediate medical attention if there is a change in your condition. SEEK IMMEDIATE MEDICAL CARE IF:   There is confusion or drowsiness.   You can not awaken the injured person.   There is nausea (feeling sick to your stomach) or continued, forceful vomiting.   You notice dizziness or unsteadiness which is getting worse, or inability to walk.   You have convulsions or unconsciousness.   You experience severe, persistent headaches not relieved by over-the-counter or prescription medicines for pain. (Do not take aspirin as this impairs clotting abilities). Take other pain medications only as directed.   You can not use arms or legs normally.   There is clear or bloody discharge from the nose or ears.  MAKE SURE YOU:   Understand these instructions.   Will watch your condition.   Will get help right away if you are not doing well or get worse.  Document Released: 03/30/2005 Document Revised: 03/19/2011 Document Reviewed:  02/15/2009 [facility] Patient Information 2012 [place], [place].

## 2011-08-20 NOTE — ED Notes (Signed)
Pt refused pain meds, states will take some when she gets home. Rates pain 0/10

## 2011-08-22 NOTE — ED Provider Notes (Signed)
Medical screening examination/treatment/procedure(s) were performed by non-physician practitioner and as supervising physician I was immediately available for consultation/collaboration.   Mykel Mohl M [place], DO 08/22/11 1049 

## 2011-08-26 ENCOUNTER — Encounter: Payer: Self-pay | Admitting: Internal Medicine

## 2011-08-26 ENCOUNTER — Ambulatory Visit (INDEPENDENT_AMBULATORY_CARE_PROVIDER_SITE_OTHER): Payer: Medicare Other | Admitting: Internal Medicine

## 2011-08-26 VITALS — BP 102/66 | HR 72 | Ht <= 58 in | Wt 215.0 lb

## 2011-08-26 DIAGNOSIS — Z950 Presence of cardiac pacemaker: Secondary | ICD-10-CM

## 2011-08-26 DIAGNOSIS — I4891 Unspecified atrial fibrillation: Secondary | ICD-10-CM

## 2011-08-26 DIAGNOSIS — I10 Essential (primary) hypertension: Secondary | ICD-10-CM

## 2011-08-26 DIAGNOSIS — S0101XA Laceration without foreign body of scalp, initial encounter: Secondary | ICD-10-CM | POA: Insufficient documentation

## 2011-08-26 LAB — PACEMAKER DEVICE OBSERVATION
AL AMPLITUDE: 4 mv
AL IMPEDENCE PM: 329 Ohm
ATRIAL PACING PM: 32
BATTERY VOLTAGE: 2.79 V
DEVICE MODEL PM: 1777467
RV LEAD IMPEDENCE PM: 434 Ohm
RV LEAD THRESHOLD: 0.75 V
VENTRICULAR PACING PM: 1

## 2011-08-26 NOTE — Assessment & Plan Note (Signed)
Her device is working normally. We'll plan to recheck in several months. 

## 2011-08-26 NOTE — Patient Instructions (Addendum)
Your physician wants you to follow-up in: 6 months with device clinic.  You will receive a reminder letter in the mail two months in advance. If you don't receive a letter, please call our office to schedule the follow-up appointment.  [facility] will call to start TTM's.   Your physician recommends that you continue on your current medications as directed. Please refer to the Current Medication list given to you today.
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
## 2011-08-26 NOTE — Assessment & Plan Note (Signed)
Today we removed the 2 staples from her scalp without difficulty. The wound is healing nicely with no residual bleeding or drainage. The skin around was not erythematous.

## 2011-08-26 NOTE — Assessment & Plan Note (Signed)
Her blood pressure is well controlled. She will continue her current medical therapy. 

## 2011-08-26 NOTE — Assessment & Plan Note (Signed)
She is in rhythm approximately 70% of the time. She'll continue her current medications. She is relatively asymptomatic.

## 2011-09-02 ENCOUNTER — Ambulatory Visit (HOSPITAL_BASED_OUTPATIENT_CLINIC_OR_DEPARTMENT_OTHER): Payer: Medicare Other

## 2011-09-02 VITALS — BP 123/69 | HR 69 | Temp 98.7°F

## 2011-09-02 DIAGNOSIS — Z452 Encounter for adjustment and management of vascular access device: Secondary | ICD-10-CM

## 2011-09-02 DIAGNOSIS — C8590 Non-Hodgkin lymphoma, unspecified, unspecified site: Secondary | ICD-10-CM

## 2011-09-02 DIAGNOSIS — C8583 Other specified types of non-Hodgkin lymphoma, intra-abdominal lymph nodes: Secondary | ICD-10-CM

## 2011-09-02 MED ORDER — HEPARIN SOD (PORK) LOCK FLUSH 100 UNIT/ML IV SOLN
500.0000 [IU] | Freq: Once | INTRAVENOUS | Status: AC
  Administered 2011-09-02: 500 [IU] via INTRAVENOUS
  Filled 2011-09-02: qty 5

## 2011-09-02 MED ORDER — SODIUM CHLORIDE 0.9 % IJ SOLN
10.0000 mL | INTRAMUSCULAR | Status: DC | PRN
  Administered 2011-09-02: 10 mL via INTRAVENOUS
  Filled 2011-09-02: qty 10

## 2011-09-02 NOTE — Patient Instructions (Signed)
Call MD for problems 

## 2011-10-12 ENCOUNTER — Ambulatory Visit (HOSPITAL_COMMUNITY)
Admission: RE | Admit: 2011-10-12 | Discharge: 2011-10-12 | Disposition: A | Discharge status: Home / Self Care | Payer: Medicare Other | Source: Ambulatory Visit | Attending: Oncology | Admitting: Oncology

## 2011-10-12 ENCOUNTER — Other Ambulatory Visit: Payer: Medicare Other | Admitting: Lab

## 2011-10-12 ENCOUNTER — Encounter (HOSPITAL_COMMUNITY): Payer: Self-pay

## 2011-10-12 ENCOUNTER — Ambulatory Visit (HOSPITAL_BASED_OUTPATIENT_CLINIC_OR_DEPARTMENT_OTHER): Payer: Medicare Other

## 2011-10-12 VITALS — BP 117/78 | HR 83 | Temp 97.0°F

## 2011-10-12 DIAGNOSIS — C8593 Non-Hodgkin lymphoma, unspecified, intra-abdominal lymph nodes: Secondary | ICD-10-CM

## 2011-10-12 DIAGNOSIS — C8589 Other specified types of non-Hodgkin lymphoma, extranodal and solid organ sites: Secondary | ICD-10-CM

## 2011-10-12 DIAGNOSIS — Z9221 Personal history of antineoplastic chemotherapy: Secondary | ICD-10-CM | POA: Insufficient documentation

## 2011-10-12 DIAGNOSIS — Z923 Personal history of irradiation: Secondary | ICD-10-CM | POA: Insufficient documentation

## 2011-10-12 DIAGNOSIS — Z452 Encounter for adjustment and management of vascular access device: Secondary | ICD-10-CM

## 2011-10-12 DIAGNOSIS — C8590 Non-Hodgkin lymphoma, unspecified, unspecified site: Secondary | ICD-10-CM

## 2011-10-12 LAB — CBC WITH DIFFERENTIAL/PLATELET
Basophils Absolute: 0.1 10*3/uL (ref 0.0–0.1)
Eosinophils Absolute: 0.3 10*3/uL (ref 0.0–0.5)
HCT: 39.8 % (ref 34.8–46.6)
HGB: 13 g/dL (ref 11.6–15.9)
LYMPH%: 14.8 % (ref 14.0–49.7)
MCV: 86.5 fL (ref 79.5–101.0)
MONO#: 0.7 10*3/uL (ref 0.1–0.9)
MONO%: 9.5 % (ref 0.0–14.0)
NEUT#: 5.1 10*3/uL (ref 1.5–6.5)
NEUT%: 70.9 % (ref 38.4–76.8)
Platelets: 361 10*3/uL (ref 145–400)
WBC: 7.1 10*3/uL (ref 3.9–10.3)

## 2011-10-12 LAB — CMP (CANCER CENTER ONLY)
ALT(SGPT): 20 U/L (ref 10–47)
AST: 19 U/L (ref 11–38)
Creat: 0.9 mg/dl (ref 0.6–1.2)
Total Bilirubin: 0.6 mg/dl (ref 0.20–1.60)

## 2011-10-12 LAB — LACTATE DEHYDROGENASE: LDH: 127 U/L (ref 94–250)

## 2011-10-12 MED ORDER — SODIUM CHLORIDE 0.9 % IJ SOLN
10.0000 mL | INTRAMUSCULAR | Status: DC | PRN
  Administered 2011-10-12: 10 mL via INTRAVENOUS
  Filled 2011-10-12: qty 10

## 2011-10-12 MED ORDER — SODIUM CHLORIDE 0.9 % IJ SOLN
10.0000 mL | INTRAMUSCULAR | Status: DC | PRN
  Filled 2011-10-12: qty 10

## 2011-10-12 MED ORDER — HEPARIN SOD (PORK) LOCK FLUSH 100 UNIT/ML IV SOLN
500.0000 [IU] | Freq: Once | INTRAVENOUS | Status: DC
  Filled 2011-10-12: qty 5

## 2011-10-12 MED ORDER — IOHEXOL 300 MG/ML  SOLN
125.0000 mL | Freq: Once | INTRAMUSCULAR | Status: AC | PRN
  Administered 2011-10-12: 125 mL via INTRAVENOUS

## 2011-10-12 MED ORDER — HEPARIN SOD (PORK) LOCK FLUSH 100 UNIT/ML IV SOLN
500.0000 [IU] | Freq: Once | INTRAVENOUS | Status: AC
  Administered 2011-10-12: 500 [IU] via INTRAVENOUS
  Filled 2011-10-12: qty 5

## 2011-10-13 ENCOUNTER — Encounter: Payer: Self-pay | Admitting: Oncology

## 2011-10-13 ENCOUNTER — Ambulatory Visit (HOSPITAL_BASED_OUTPATIENT_CLINIC_OR_DEPARTMENT_OTHER): Payer: Medicare Other | Admitting: Oncology

## 2011-10-13 ENCOUNTER — Telehealth: Payer: Self-pay | Admitting: Oncology

## 2011-10-13 VITALS — BP 113/78 | HR 53 | Temp 98.0°F | Ht <= 58 in | Wt 219.4 lb

## 2011-10-13 DIAGNOSIS — E119 Type 2 diabetes mellitus without complications: Secondary | ICD-10-CM

## 2011-10-13 DIAGNOSIS — C8593 Non-Hodgkin lymphoma, unspecified, intra-abdominal lymph nodes: Secondary | ICD-10-CM

## 2011-10-13 DIAGNOSIS — C8589 Other specified types of non-Hodgkin lymphoma, extranodal and solid organ sites: Secondary | ICD-10-CM

## 2011-10-13 DIAGNOSIS — I4891 Unspecified atrial fibrillation: Secondary | ICD-10-CM

## 2011-10-13 DIAGNOSIS — I10 Essential (primary) hypertension: Secondary | ICD-10-CM

## 2011-10-13 NOTE — Progress Notes (Signed)
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  PASTTHERAPY:   q3wk R-CHOP x6 cycles; finished in Dec 2012.   CURRENT THERAPY:  Pending evaluation for consolidative radiation.   INTERVAL HISTORY: [name] 71 y.o. female returns for regular follow up with her daughter.  She has leg pain and thus she is wheel-chair bound.  She has had normal appetite and weight [place] increasing.  She denies abdominal pain, constipation, diarrhea, visible source of bleeding, palpable node swelling. No night sweats.  Patient denies fatigue, headache, visual changes, confusion, drenching night sweats, mucositis, odynophagia, dysphagia, nausea vomiting, jaundice, chest pain, palpitation, shortness of breath, dyspnea on exertion, productive cough, gum bleeding, epistaxis, hematemesis, hemoptysis, abdominal pain, abdominal swelling, early satiety, melena, hematochezia, hematuria, skin rash, spontaneous bleeding, joint swelling, joint pain, heat or cold intolerance, bowel bladder incontinence,focal motor weakness, paresthesia, depression, suicidal or homocidal ideation, feeling hopelessness.  MEDICAL HISTORY: Past Medical History  Diagnosis Date  . Sick sinus syndrome   . Hematoma     At the site of the pacemaker insertion.  . Hypotension   . Dyslipidemia   . Diabetes mellitus   . Pacemaker   . History of atrial fibrillation   . Arthritis     Osteoarthritis  . Hypothyroidism   . Port-a-cath in place 12/08/10  . Status post chemotherapy completed 03/2011    R - CHOP q 3 weeks x 6  . S/P radiation therapy 05/28/2011 - 06/26/2011    Right Abdomen and Right Pelvis/3060 cGy in 17 Fractions  . Diffuse large B cell lymphoma   . Cancer 10/2010    large B cell lymphoma    SURGICAL HISTORY:  Past Surgical History  Procedure Date  . Pacemaker insertion     Lead revision completed August 04, 2006  . Cholecystectomy   . Tubal ligation    Bilateral  . Insert / replace / remove pacemaker   . Abdominal hysterectomy   . Biopsy stomach 11/03/10    Soft Tissue Mass, Biopsy, Right Lower Quadrant Mesenteric Mass- High Grade Non-Hodgkins B Cell Lymphoma  with Flow Cytometry  . Bone biopsy 11/24/10    Bone Marrow, Aspirate Biospy, and clot, Left - No Involvement of Non-Hodgkin's Lymphoma Identified    MEDICATIONS: Current Outpatient Prescriptions  Medication Sig Dispense Refill  . albuterol (PROVENTIL) (2.5 MG/3ML) 0.083% nebulizer solution Take 2.5 mg by nebulization every 6 (six) hours as needed.      . citalopram (CELEXA) 20 MG tablet Take 20 mg by mouth daily.        Marland Kitchen diltiazem (CARDIZEM CD) 180 MG 24 hr capsule Take 180 mg by mouth daily.        Marland Kitchen levothyroxine (SYNTHROID, LEVOTHROID) 100 MCG tablet Take 100 mcg by mouth daily.        . metFORMIN (GLUCOPHAGE) 500 MG tablet Take 1,000 mg by mouth. 2 in the morning and 2 tablet at night      . pravastatin (PRAVACHOL) 20 MG tablet Take 20 mg by mouth daily.       Marland Kitchen warfarin (COUMADIN) 6 MG tablet Take 3-6 mg by mouth See admin instructions. Pt takes 1 tablet on  ( 6 mg) Tuesday,thursday,saturday,sunday,  1/2 tab ( 3 mg ) on Monday,wednesday, Friday,      . Calcium Carbonate-Vitamin D (CALCIUM-VITAMIN D) 500-200 MG-UNIT per tablet Take 1 tablet by mouth 2 (two) times daily with a meal.  ALLERGIES:  is allergic to codeine.  REVIEW OF SYSTEMS:  The rest of the 14-point review of system was negative.   Filed Vitals:   10/13/11 1043  BP: 113/78  Pulse: 53  Temp: 98 F (36.7 C)   Wt Readings from Last 3 Encounters:  10/13/11 219 lb 6.4 oz (99.519 kg)  08/26/11 215 lb (97.523 kg)  08/17/11 214 lb 4.8 oz (97.206 kg)   ECOG Performance status: 1 due to her morbid obesity and osteoarthritis.   PHYSICAL EXAMINATION:   General:  Morbidly obese woman in no acute distress.  Eyes:  no scleral icterus.  ENT:  There were no oropharyngeal lesions.  Neck was without thyromegaly.   Lymphatics:  Negative cervical, supraclavicular or axillary adenopathy.  Respiratory: lungs were clear bilaterally without wheezing or crackles.  Cardiovascular:  Regular rate and rhythm, S1/S2, without murmur, rub or gallop.  There was no pedal edema.  GI:  abdomen was soft, flat, nontender, nondistended, without organomegaly.  Muscoloskeletal:  no spinal tenderness of palpation of vertebral spine.  Skin exam was without echymosis, petichae.  Neuro exam was nonfocal. Patient was alerted and oriented.  Attention was good.   Language was appropriate.  Mood was normal without depression.  Speech was not pressured.  Thought content was not tangential.     LABORATORY/RADIOLOGY DATA:  Lab Results  Component Value Date   WBC 7.1 10/12/2011   HGB 13.0 10/12/2011   HCT 39.8 10/12/2011   PLT 361 10/12/2011   GLUCOSE 131* 10/12/2011   ALT 14 07/29/2011   AST 19 10/12/2011   NA 141 10/12/2011   K 4.4 10/12/2011   CL 95* 10/12/2011   CREATININE 0.9 10/12/2011   BUN 15 10/12/2011   CO2 29 10/12/2011   INR 1.62* 04/07/2011   RADIOLOGY:  *RADIOLOGY REPORT*  Clinical Data: Large B-cell lymphoma. Chemotherapy completed  December 2012. Radiation therapy complete degenerative 2013.  CT CHEST, ABDOMEN AND PELVIS WITH CONTRAST  Technique: Multidetector CT imaging of the chest, abdomen and  pelvis was performed following the standard protocol during bolus  administration of intravenous contrast.  Contrast: OMNIPAQUE IOHEXOL 300 MG/ML SOLN  Comparison: PET CT scan 04/27/2011, CT scan of 09/19/2010  CT CHEST  Findings: There is a port in the right chest wall. No axillary or  supraclavicular lymphadenopathy. The 10 mm short axis right lower  paratracheal lymph node which is slightly more prominent than 9 mm  on prior. Small prevascular lymph node measuring 7 mm measures  slightly larger than 5 mm on prior. 8 mm prevascular lymph node  unchanged from 9 mm on prior. No new adenopathy. No pericardial  fluid.  Review of  the lung windows demonstrates resolution of the  inflammatory nodule at the left lung base. No new or suspicious  pulmonary nodules.  IMPRESSION:  1. Several mediastinal and prevascular node measures slightly  larger. No new lymph nodes are present. Recommendation on follow-  up.  2. Resolution of inflammatory nodule at the left lung base.  CT ABDOMEN AND PELVIS  Findings: No focal hepatic lesion. Prior cholecystectomy.  Pancreas, spleen, adrenal glands, kidneys are unchanged.  The stomach, small bowel, and cecum are normal.  The mesenteric mass within the ileocecal mesentery continues to  decrease in size measuring 27 x 16 mm compared to 28 x 23 mm on  prior. There is an aortocaval lymph node which measures 12 mm  (image 70) which is increased from 6 mm on prior. No additional  peritoneal disease.  Post hysterectomy anatomy. No pelvic lymphadenopathy. Review of  bone windows demonstrates no aggressive osseous lesions.  IMPRESSION:  1. Interval increase in size of aortocaval lymph node measuring  12 mm compared to 6 mm on prior. This is concerning for mild  disease progression.  2. Mesenteric mass in the ileocecal mesentery is decreased  slightly in size.  3. Spleen is normal.  Original Report Authenticated By: Genevive Bi, M.D.   ASSESSMENT AND PLAN:   1. Non-Hodgkin lymphoma: Slight increase in lymph nodes noted on CT scan. She does not have any B symptoms today. Labs remain normal. Discussed results with the patient and her daughter. It is not clear if the slight enlargement of her lymph nodes represents recurrence of her lymphoma. Recommend short terms follow-up with a CT scan and labs in 3-4 months. 2. Anemia:  From past chemo.  It has resolved.  3. Hypothyroidism: She has levothyroxine per PCP. 4. Hypertension:  She is on diltiazem PCP with good blood pressure control. 5. Diabetes mellitus, type II:  She is on metformin per PCP.   6. Hyperlipidemia:  She is on  pravastatin per PCP.  7. History of atrial fibrillation:  She is on diltiazem, and she has a pacemaker.  She is now on Coumadin. 8. Follow-up: In 3-4 months with labs and a CT scan. She will continue to have her PAC flushed every 6-8 weeks.  The length of time of the face-to-face encounter was 15 minutes. More than 50% of time was spent counseling and coordination of care.

## 2011-10-13 NOTE — Telephone Encounter (Signed)
gv pt new appt schedule for August and October including for 10/7. cx'd 9/26 flush. Pt will do flush w/lb and ct on 10/7.

## 2011-10-13 NOTE — Patient Instructions (Addendum)
We will schedule labs and a CT scan in 3-4 months. You will see Dr Gaylyn Rong 1-2 days after the scan to review the results.  Results for [name], [name] (MRN [id number]) as of 10/13/2011 10:39  Ref. Range 10/12/2011 09:57  Sodium Latest Range: 135-145 mEq/L 141  Potassium Latest Range: 3.5-5.3 mEq/L 4.4  Chloride Latest Range: 96-112 mEq/L 95 (L)  CO2 Latest Range: 19-32 mEq/L 29  Glucose, Bld Latest Range: 73-118 mg/dL 409 (H)  Creat Latest Range: 0.50-1.10 mg/dL 0.9  Calcium Latest Range: 8.4-10.5 mg/dL 9.1  BUN, Bld Latest Range: 7-22 mg/dL 15  Alkaline Phosphatase Latest Range: 39-117 U/L 65  Albumin Latest Range: 3.3-5.5 g/dL 3.6  AST Latest Range: 0-37 U/L 19  ALT(SGPT) Latest Range: 10-47 U/L 20  Total Protein Latest Range: 6.0-8.3 g/dL 7.2  Total Bilirubin Latest Range: 0.3-1.2 mg/dL 8.11  LDH Latest Range: 94-250 U/L 127  WBC Latest Range: 4.0-10.5 K/uL 7.1  RBC Latest Range: 3.87-5.11 MIL/uL 4.59  Hemoglobin Latest Range: 12.0-15.0 g/dL 91.4  HCT Latest Range: 36.0-46.0 % 39.8  MCV Latest Range: 78.0-100.0 fL 86.5  MCH Latest Range: 26.0-34.0 pg 28.4  MCHC Latest Range: 30.0-36.0 g/dL 78.2  RDW Latest Range: 11.5-15.5 % 16.7 (H)  Platelets Latest Range: 150-400 K/uL 361  NEUT% Latest Range: 38.4-76.8 % 70.9  LYMPH% Latest Range: 14.0-49.7 % 14.8  MONO% Latest Range: 0.0-14.0 % 9.5  EOS% Latest Range: 0.0-7.0 % 3.6  BASO% Latest Range: 0.0-2.0 % 1.2  NEUT# Latest Range: 1.7-7.7 K/uL 5.1  MONO# Latest Range: 0.1-0.9 10e3/uL 0.7  Eosinophils Absolute Latest Range: 0.0-0.5 10e3/uL 0.3  Basophils Absolute Latest Range: 0.0-0.1 K/uL 0.1  lymph# Latest Range: 0.9-3.3 10e3/uL 1.1   *RADIOLOGY REPORT*  Clinical Data: Large B-cell lymphoma. Chemotherapy completed  December 2012. Radiation therapy complete degenerative 2013.  CT CHEST, ABDOMEN AND PELVIS WITH CONTRAST  Technique: Multidetector CT imaging of the chest, abdomen and  pelvis was performed following the standard protocol  during bolus  administration of intravenous contrast.  Contrast: OMNIPAQUE IOHEXOL 300 MG/ML SOLN  Comparison: PET CT scan 04/27/2011, CT scan of 09/19/2010  CT CHEST  Findings: There is a port in the right chest wall. No axillary or  supraclavicular lymphadenopathy. The 10 mm short axis right lower  paratracheal lymph node which is slightly more prominent than 9 mm  on prior. Small prevascular lymph node measuring 7 mm measures  slightly larger than 5 mm on prior. 8 mm prevascular lymph node  unchanged from 9 mm on prior. No new adenopathy. No pericardial  fluid.  Review of the lung windows demonstrates resolution of the  inflammatory nodule at the left lung base. No new or suspicious  pulmonary nodules.  IMPRESSION:  1. Several mediastinal and prevascular node measures slightly  larger. No new lymph nodes are present. Recommendation on follow-  up.  2. Resolution of inflammatory nodule at the left lung base.  CT ABDOMEN AND PELVIS  Findings: No focal hepatic lesion. Prior cholecystectomy.  Pancreas, spleen, adrenal glands, kidneys are unchanged.  The stomach, small bowel, and cecum are normal.  The mesenteric mass within the ileocecal mesentery continues to  decrease in size measuring 27 x 16 mm compared to 28 x 23 mm on  prior. There is an aortocaval lymph node which measures 12 mm  (image 70) which is increased from 6 mm on prior. No additional  peritoneal disease.  Post hysterectomy anatomy. No pelvic lymphadenopathy. Review of  bone windows demonstrates no aggressive osseous lesions.  IMPRESSION:  1. Interval increase in size of aortocaval lymph node measuring  12 mm compared to 6 mm on prior. This is concerning for mild  disease progression.  2. Mesenteric mass in the ileocecal mesentery is decreased  slightly in size.  3. Spleen is normal.  Original Report Authenticated By: Genevive Bi, M.D.

## 2011-11-26 ENCOUNTER — Other Ambulatory Visit: Payer: Self-pay

## 2011-11-26 ENCOUNTER — Ambulatory Visit: Payer: Medicare Other

## 2011-11-26 NOTE — Progress Notes (Signed)
Called patient re: missed appt.  Pt did not remember, asked to reschedule.  Notified scheduling.

## 2012-01-18 ENCOUNTER — Other Ambulatory Visit (HOSPITAL_BASED_OUTPATIENT_CLINIC_OR_DEPARTMENT_OTHER): Payer: Medicare Other | Admitting: Lab

## 2012-01-18 ENCOUNTER — Encounter (HOSPITAL_COMMUNITY): Payer: Self-pay

## 2012-01-18 ENCOUNTER — Ambulatory Visit (HOSPITAL_COMMUNITY)
Admission: RE | Admit: 2012-01-18 | Discharge: 2012-01-18 | Disposition: A | Discharge status: Home / Self Care | Payer: Medicare Other | Source: Ambulatory Visit | Attending: Oncology | Admitting: Oncology

## 2012-01-18 ENCOUNTER — Ambulatory Visit (HOSPITAL_BASED_OUTPATIENT_CLINIC_OR_DEPARTMENT_OTHER): Payer: Medicare Other

## 2012-01-18 VITALS — BP 111/66 | HR 89 | Temp 97.2°F

## 2012-01-18 DIAGNOSIS — C8589 Other specified types of non-Hodgkin lymphoma, extranodal and solid organ sites: Secondary | ICD-10-CM | POA: Insufficient documentation

## 2012-01-18 DIAGNOSIS — R599 Enlarged lymph nodes, unspecified: Secondary | ICD-10-CM | POA: Insufficient documentation

## 2012-01-18 DIAGNOSIS — C8593 Non-Hodgkin lymphoma, unspecified, intra-abdominal lymph nodes: Secondary | ICD-10-CM

## 2012-01-18 DIAGNOSIS — C8583 Other specified types of non-Hodgkin lymphoma, intra-abdominal lymph nodes: Secondary | ICD-10-CM

## 2012-01-18 DIAGNOSIS — Z452 Encounter for adjustment and management of vascular access device: Secondary | ICD-10-CM

## 2012-01-18 DIAGNOSIS — R1909 Other intra-abdominal and pelvic swelling, mass and lump: Secondary | ICD-10-CM | POA: Insufficient documentation

## 2012-01-18 DIAGNOSIS — K7689 Other specified diseases of liver: Secondary | ICD-10-CM | POA: Insufficient documentation

## 2012-01-18 LAB — COMPREHENSIVE METABOLIC PANEL (CC13)
ALT: 17 U/L (ref 0–55)
AST: 15 U/L (ref 5–34)
Albumin: 3.7 g/dL (ref 3.5–5.0)
Alkaline Phosphatase: 70 U/L (ref 40–150)
Potassium: 4.5 mEq/L (ref 3.5–5.1)
Sodium: 139 mEq/L (ref 136–145)
Total Protein: 7.2 g/dL (ref 6.4–8.3)

## 2012-01-18 LAB — CBC WITH DIFFERENTIAL/PLATELET
BASO%: 1 % (ref 0.0–2.0)
EOS%: 4.6 % (ref 0.0–7.0)
HCT: 38.2 % (ref 34.8–46.6)
LYMPH%: 18.3 % (ref 14.0–49.7)
MCH: 29.2 pg (ref 25.1–34.0)
MCHC: 33.8 g/dL (ref 31.5–36.0)
MONO#: 0.8 10*3/uL (ref 0.1–0.9)
MONO%: 13.3 % (ref 0.0–14.0)
NEUT%: 62.8 % (ref 38.4–76.8)
Platelets: 289 10*3/uL (ref 145–400)
RBC: 4.42 10*6/uL (ref 3.70–5.45)
WBC: 6.2 10*3/uL (ref 3.9–10.3)

## 2012-01-18 LAB — LACTATE DEHYDROGENASE (CC13): LDH: 160 U/L (ref 125–220)

## 2012-01-18 MED ORDER — IOHEXOL 300 MG/ML  SOLN
125.0000 mL | Freq: Once | INTRAMUSCULAR | Status: AC | PRN
  Administered 2012-01-18: 125 mL via INTRAVENOUS

## 2012-01-18 MED ORDER — SODIUM CHLORIDE 0.9 % IJ SOLN
10.0000 mL | INTRAMUSCULAR | Status: DC | PRN
  Administered 2012-01-18: 10 mL via INTRAVENOUS
  Filled 2012-01-18: qty 10

## 2012-01-18 MED ORDER — HEPARIN SOD (PORK) LOCK FLUSH 100 UNIT/ML IV SOLN
500.0000 [IU] | Freq: Once | INTRAVENOUS | Status: AC
  Administered 2012-01-18: 500 [IU] via INTRAVENOUS
  Filled 2012-01-18: qty 5

## 2012-01-19 NOTE — Patient Instructions (Addendum)
A.  CT findings:  CT CHEST  Findings: The chest wall is stable. A right-sided Port-A-Cath is  noted along with a left-sided pacemaker. No breast masses,  supraclavicular or axillary lymphadenopathy. The bony thorax is  intact.  The heart is normal in size. No pericardial effusion. No enlarged  mediastinal or hilar lymph nodes. Stable scattered lymph nodes.  The esophagus is grossly normal. The aorta is normal in caliber.  No dissection.  Examination of the lung parenchyma demonstrates breathing motion  artifact but no worrisome pulmonary lesions or acute pulmonary  findings. No pleural effusion. The tracheobronchial tree is  unremarkable.  IMPRESSION:  Unremarkable and stable CT appearance of the chest. No findings  for lymphadenopathy.  CT ABDOMEN AND PELVIS  Findings: The liver is unremarkable and stable. Mild fatty  infiltration is noted. No focal hepatic lesions or intrahepatic  biliary dilatation. The portal and hepatic veins are patent. The  gallbladder is surgically absent. No common bile duct dilatation.  The pancreas is normal. The spleen is normal in size. No focal  lesions. The adrenal glands and kidneys are unremarkable and  stable. Upper pole right renal cyst is again demonstrated.  The stomach, duodenum, small bowel and colon are unremarkable.  There is diverticulosis of the sigmoid colon and moderate to large  amount of stool in the rest of the colon which could suggest  constipation.  The mesenteric mass at the level of the ileocecal valve is slightly  smaller. It previously measured 2.7 x 1.6 cm and now measures 2.4  x 1.6 cm. The retroperitoneal lymph node between the third portion  of the duodenum and the IVC has increased in size. It previously  measured 12.5 x 9.0 cm and now measures 23.0 x 15.5 mm. Small  cluster of nodes near the pancreatic head is stable.  The uterus is surgically absent. Both ovaries are still present  and appear normal. The bladder is  normal. No pelvic mass or  enlarged pelvic lymph nodes.  No inguinal lymphadenopathy. The bony pelvis is intact.  IMPRESSION:  1. Slight interval decrease in size of the mesenteric mass.  2. Enlarging retroperitoneal lymph node.  3. No new lymphadenopathy.  B.  Plan: - History of lymphoma. - Slight increase in size of abdominal node.  - I recommend follow up with abdominal CT in about 4 months.  If significantly increases in size, then further work up may be considered then.

## 2012-01-20 ENCOUNTER — Telehealth: Payer: Self-pay | Admitting: Oncology

## 2012-01-20 ENCOUNTER — Ambulatory Visit (HOSPITAL_BASED_OUTPATIENT_CLINIC_OR_DEPARTMENT_OTHER): Payer: Medicare Other | Admitting: Oncology

## 2012-01-20 ENCOUNTER — Telehealth: Payer: Self-pay | Admitting: Internal Medicine

## 2012-01-20 VITALS — BP 115/83 | HR 68 | Temp 97.0°F | Resp 20 | Ht <= 58 in | Wt 225.6 lb

## 2012-01-20 DIAGNOSIS — I10 Essential (primary) hypertension: Secondary | ICD-10-CM

## 2012-01-20 DIAGNOSIS — C8583 Other specified types of non-Hodgkin lymphoma, intra-abdominal lymph nodes: Secondary | ICD-10-CM

## 2012-01-20 DIAGNOSIS — I4891 Unspecified atrial fibrillation: Secondary | ICD-10-CM

## 2012-01-20 DIAGNOSIS — Z7901 Long term (current) use of anticoagulants: Secondary | ICD-10-CM

## 2012-01-20 DIAGNOSIS — C8330 Diffuse large B-cell lymphoma, unspecified site: Secondary | ICD-10-CM

## 2012-01-20 NOTE — Telephone Encounter (Signed)
Printed and gv pt appt schedule for Nov, Dec, and Feb

## 2012-01-20 NOTE — Telephone Encounter (Signed)
plz return call to patient. She would like to do a remote transmission rather than a device clinic appnt. She can be reached at hm#

## 2012-01-20 NOTE — Progress Notes (Signed)
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   PASTTHERAPY: q3wk R-CHOP x6 cycles; finished in Dec 2012; followed by consolidative radiation therapy.    CURRENT THERAPY: watchful observation.   INTERVAL HISTORY: [name] 71 y.o. female returns for regular follow up by herself.  She feels sad that her son with disability had to move to a nursing home recently because of her inability to care for him any more.  She herself has chronic lower back and bilateral knee pain.  She is still able to ambulate short distances.  She is still able to drive a car.    Patient denies fever, anorexia, weight loss, fatigue, headache, visual changes, confusion, drenching night sweats, palpable lymph node swelling, mucositis, odynophagia, dysphagia, nausea vomiting, jaundice, chest pain, palpitation, shortness of breath, dyspnea on exertion, productive cough, gum bleeding, epistaxis, hematemesis, hemoptysis, abdominal pain, abdominal swelling, early satiety, melena, hematochezia, hematuria, skin rash, spontaneous bleeding, heat or cold intolerance, bowel bladder incontinence, back pain, focal motor weakness, paresthesia, depression, suicidal or homicidal ideation, feeling hopelessness.   Past Medical History  Diagnosis Date  . Sick sinus syndrome   . Hematoma     At the site of the pacemaker insertion.  . Hypotension   . Dyslipidemia   . Diabetes mellitus   . Pacemaker   . History of atrial fibrillation   . Arthritis     Osteoarthritis  . Hypothyroidism   . Port-a-cath in place 12/08/10  . Status post chemotherapy completed 03/2011    R - CHOP q 3 weeks x 6  . S/P radiation therapy 05/28/2011 - 06/26/2011    Right Abdomen and Right Pelvis/3060 cGy in 17 Fractions  . Diffuse large B cell lymphoma   . Cancer 10/2010    large B cell lymphoma    Past Surgical History  Procedure  Date  . Pacemaker insertion     Lead revision completed August 04, 2006  . Cholecystectomy   . Tubal ligation     Bilateral  . Insert / replace / remove pacemaker   . Abdominal hysterectomy   . Biopsy stomach 11/03/10    Soft Tissue Mass, Biopsy, Right Lower Quadrant Mesenteric Mass- High Grade Non-Hodgkins B Cell Lymphoma  with Flow Cytometry  . Bone biopsy 11/24/10    Bone Marrow, Aspirate Biospy, and clot, Left - No Involvement of Non-Hodgkin's Lymphoma Identified    Current Outpatient Prescriptions  Medication Sig Dispense Refill  . albuterol (PROVENTIL) (2.5 MG/3ML) 0.083% nebulizer solution Take 2.5 mg by nebulization every 6 (six) hours as needed.      . Calcium Carbonate-Vitamin D (CALCIUM-VITAMIN D) 500-200 MG-UNIT per tablet Take 1 tablet by mouth 2 (two) times daily with a meal.      . citalopram (CELEXA) 20 MG tablet Take 20 mg by mouth daily.        Marland Kitchen diltiazem (CARDIZEM CD) 180 MG 24 hr capsule Take 180 mg by mouth daily.        Marland Kitchen levothyroxine (SYNTHROID, LEVOTHROID) 100 MCG tablet Take 100 mcg by mouth daily.        . metFORMIN (GLUCOPHAGE) 500 MG tablet Take 1,000 mg by mouth. 2 in the morning and 2 tablet at night      . pravastatin (PRAVACHOL) 20 MG tablet Take 20 mg by mouth daily.       Marland Kitchen warfarin (COUMADIN) 6 MG tablet Take 3-6  mg by mouth See admin instructions. Pt takes 1 tablet on  ( 6 mg) Tuesday,thursday,saturday,sunday,  1/2 tab ( 3 mg ) on Monday,wednesday, Friday,        ALLERGIES:  is allergic to codeine.  REVIEW OF SYSTEMS:  The rest of the 14-point review of system was negative.   Filed Vitals:   01/20/12 0941  BP: 115/83  Pulse: 68  Temp: 97 F (36.1 C)  Resp: 20   Wt Readings from Last 3 Encounters:  01/20/12 225 lb 9.6 oz (102.331 kg)  10/13/11 219 lb 6.4 oz (99.519 kg)  08/26/11 215 lb (97.523 kg)   ECOG Performance status: 2 due to obesity, osteoarthritis.   PHYSICAL EXAMINATION:  General:  Obese woman, in wheelchair, in no acute  distress.  Eyes:  no scleral icterus.  ENT:  There were no oropharyngeal lesions.  Neck was without thyromegaly.  Lymphatics:  Negative cervical, supraclavicular or axillary adenopathy.  Respiratory: lungs were clear bilaterally without wheezing or crackles.  Cardiovascular:  Regular rate and rhythm, S1/S2, without murmur, rub or gallop.  There was no pedal edema.  GI:  abdomen was soft, obese, nontender, nondistended, without organomegaly.  Muscoloskeletal:  no spinal tenderness of palpation of vertebral spine.  Skin exam was without echymosis, petichae.       LABORATORY/RADIOLOGY DATA:  Lab Results  Component Value Date   WBC 6.2 01/18/2012   HGB 12.9 01/18/2012   HCT 38.2 01/18/2012   PLT 289 01/18/2012   GLUCOSE 111* 01/18/2012   ALKPHOS 70 01/18/2012   ALT 17 01/18/2012   AST 15 01/18/2012   NA 139 01/18/2012   K 4.5 01/18/2012   CL 102 01/18/2012   CREATININE 0.9 01/18/2012   BUN 11.0 01/18/2012   CO2 26 01/18/2012   INR 1.62* 04/07/2011    IMAGING:  I personally reviewed the following CT scans:   01/18/2012  *RADIOLOGY REPORT*  Clinical Data:  Restaging non-Hodgkins lymphoma.  CT CHEST, ABDOMEN AND PELVIS WITH CONTRAST  Technique:  Multidetector CT imaging of the chest, abdomen and pelvis was performed following the standard protocol during bolus administration of intravenous contrast.  Contrast: OMNIPAQUE IOHEXOL 300 MG/ML  SOLN  Comparison:  10/12/2011.  CT CHEST  Findings:  The chest wall is stable.  A right-sided Port-A-Cath is noted along with a left-sided pacemaker.  No breast masses, supraclavicular or axillary lymphadenopathy.  The bony thorax is intact.  The heart is normal in size.  No pericardial effusion.  No enlarged mediastinal or hilar lymph nodes.  Stable scattered lymph nodes. The esophagus is grossly normal.  The aorta is normal in caliber. No dissection.  Examination of the lung parenchyma demonstrates breathing motion artifact but no worrisome pulmonary lesions or  acute pulmonary findings.  No pleural effusion.  The tracheobronchial tree is unremarkable.  IMPRESSION: Unremarkable and stable CT appearance of the chest.  No findings for lymphadenopathy.  CT ABDOMEN AND PELVIS  Findings:  The liver is unremarkable and stable.  Mild fatty infiltration is noted.  No focal hepatic lesions or intrahepatic biliary dilatation.  The portal and hepatic veins are patent.  The gallbladder is surgically absent.  No common bile duct dilatation. The pancreas is normal.  The spleen is normal in size.  No focal lesions.  The adrenal glands and kidneys are unremarkable and stable.  Upper pole right renal cyst is again demonstrated.  The stomach, duodenum, small bowel and colon are unremarkable. There is diverticulosis of the sigmoid colon and moderate  to large amount of stool in the rest of the colon which could suggest constipation.  The mesenteric mass at the level of the ileocecal valve is slightly smaller.  It previously measured 2.7 x 1.6 cm and now measures 2.4 x 1.6 cm.  The retroperitoneal lymph node between the third portion of the duodenum and the IVC has increased in size.  It previously measured 12.5 x 9.0 cm and now measures 23.0 x 15.5 mm. Small cluster of nodes near the pancreatic head is stable.  The uterus is surgically absent.  Both ovaries are still present and appear normal.  The bladder is normal.  No pelvic mass or enlarged pelvic lymph nodes.  No inguinal lymphadenopathy.  The bony pelvis is intact.  IMPRESSION:  1.  Slight interval decrease in size of the mesenteric mass. 2.  Enlarging retroperitoneal lymph node. 3.  No new lymphadenopathy.   Original Report Authenticated By: P. Loralie Champagne, M.D.      ASSESSMENT AND PLAN:    1. Non-Hodgkin lymphoma: Clinically, she continues to be in remission.  CT scan this time showed slight enlarged retroperitoneal adenopathy.  She has no symptoms.  I recommended watchful observation with repeat abdominal CT scan in about 4  months.  If it continues to enlarge, then we will consider further work up.  2. Hypothyroidism: She has levothyroxine per PCP. 3. Hypertension: She is on diltiazem PCP with good blood pressure control. 4. Diabetes mellitus, type II: She is on metformin per PCP.  5. Hyperlipidemia: She is on pravastatin per PCP.  6. History of atrial fibrillation: She is on diltiazem, and she has a pacemaker. She is now on Coumadin being managed per her PCP. 7. Follow with me in clinic in about 4 months with CT abdomen/pelvis the day prior.  8. Flu vaccine:  Pt declined despite explanation of risk and benefit.   The length of time of the face-to-face encounter was 15 minutes. More than 50% of time was spent counseling and coordination of care.

## 2012-01-20 NOTE — Telephone Encounter (Signed)
Spoke w/pt in regards to sending TTM instead of office check. Phone # to [facility] was given to pt.

## 2012-02-19 ENCOUNTER — Telehealth: Payer: Self-pay | Admitting: Oncology

## 2012-02-19 NOTE — Telephone Encounter (Signed)
pt called and needed to reschedule flush appt. done

## 2012-02-25 ENCOUNTER — Ambulatory Visit (HOSPITAL_BASED_OUTPATIENT_CLINIC_OR_DEPARTMENT_OTHER): Payer: Medicare Other

## 2012-02-25 VITALS — BP 122/54 | HR 69 | Temp 98.5°F

## 2012-02-25 DIAGNOSIS — C8583 Other specified types of non-Hodgkin lymphoma, intra-abdominal lymph nodes: Secondary | ICD-10-CM

## 2012-02-25 DIAGNOSIS — C8590 Non-Hodgkin lymphoma, unspecified, unspecified site: Secondary | ICD-10-CM

## 2012-02-25 DIAGNOSIS — Z452 Encounter for adjustment and management of vascular access device: Secondary | ICD-10-CM

## 2012-02-25 MED ORDER — HEPARIN SOD (PORK) LOCK FLUSH 100 UNIT/ML IV SOLN
500.0000 [IU] | Freq: Once | INTRAVENOUS | Status: AC
  Administered 2012-02-25: 500 [IU] via INTRAVENOUS
  Filled 2012-02-25: qty 5

## 2012-02-25 MED ORDER — SODIUM CHLORIDE 0.9 % IJ SOLN
10.0000 mL | INTRAMUSCULAR | Status: DC | PRN
  Administered 2012-02-25: 10 mL via INTRAVENOUS
  Filled 2012-02-25: qty 10

## 2012-03-04 NOTE — Progress Notes (Signed)
Received office notes from [facility]; forwarded to Dr. Ha. 

## 2012-03-09 DIAGNOSIS — I495 Sick sinus syndrome: Secondary | ICD-10-CM

## 2012-03-31 ENCOUNTER — Ambulatory Visit (HOSPITAL_BASED_OUTPATIENT_CLINIC_OR_DEPARTMENT_OTHER): Payer: Medicare Other

## 2012-03-31 VITALS — BP 125/60 | HR 69 | Temp 98.4°F

## 2012-03-31 DIAGNOSIS — Z452 Encounter for adjustment and management of vascular access device: Secondary | ICD-10-CM

## 2012-03-31 DIAGNOSIS — C8590 Non-Hodgkin lymphoma, unspecified, unspecified site: Secondary | ICD-10-CM

## 2012-03-31 DIAGNOSIS — C8583 Other specified types of non-Hodgkin lymphoma, intra-abdominal lymph nodes: Secondary | ICD-10-CM

## 2012-03-31 MED ORDER — SODIUM CHLORIDE 0.9 % IJ SOLN
10.0000 mL | INTRAMUSCULAR | Status: DC | PRN
  Administered 2012-03-31: 10 mL via INTRAVENOUS
  Filled 2012-03-31: qty 10

## 2012-03-31 MED ORDER — HEPARIN SOD (PORK) LOCK FLUSH 100 UNIT/ML IV SOLN
500.0000 [IU] | Freq: Once | INTRAVENOUS | Status: AC
  Administered 2012-03-31: 500 [IU] via INTRAVENOUS
  Filled 2012-03-31: qty 5

## 2012-05-23 ENCOUNTER — Ambulatory Visit (HOSPITAL_BASED_OUTPATIENT_CLINIC_OR_DEPARTMENT_OTHER): Payer: Medicare Other

## 2012-05-23 ENCOUNTER — Telehealth: Payer: Self-pay | Admitting: Oncology

## 2012-05-23 ENCOUNTER — Ambulatory Visit (HOSPITAL_COMMUNITY)
Admission: RE | Admit: 2012-05-23 | Discharge: 2012-05-23 | Disposition: A | Discharge status: Home / Self Care | Payer: Medicare Other | Source: Ambulatory Visit | Attending: Oncology | Admitting: Oncology

## 2012-05-23 ENCOUNTER — Encounter (HOSPITAL_COMMUNITY): Payer: Self-pay

## 2012-05-23 ENCOUNTER — Other Ambulatory Visit (HOSPITAL_BASED_OUTPATIENT_CLINIC_OR_DEPARTMENT_OTHER): Payer: Medicare Other | Admitting: Lab

## 2012-05-23 VITALS — BP 122/76 | HR 72 | Temp 97.6°F

## 2012-05-23 DIAGNOSIS — C8589 Other specified types of non-Hodgkin lymphoma, extranodal and solid organ sites: Secondary | ICD-10-CM

## 2012-05-23 DIAGNOSIS — Z9089 Acquired absence of other organs: Secondary | ICD-10-CM | POA: Insufficient documentation

## 2012-05-23 DIAGNOSIS — K449 Diaphragmatic hernia without obstruction or gangrene: Secondary | ICD-10-CM | POA: Insufficient documentation

## 2012-05-23 DIAGNOSIS — Z90710 Acquired absence of both cervix and uterus: Secondary | ICD-10-CM | POA: Insufficient documentation

## 2012-05-23 DIAGNOSIS — N289 Disorder of kidney and ureter, unspecified: Secondary | ICD-10-CM | POA: Insufficient documentation

## 2012-05-23 DIAGNOSIS — Z9221 Personal history of antineoplastic chemotherapy: Secondary | ICD-10-CM | POA: Insufficient documentation

## 2012-05-23 DIAGNOSIS — R599 Enlarged lymph nodes, unspecified: Secondary | ICD-10-CM | POA: Insufficient documentation

## 2012-05-23 DIAGNOSIS — K5730 Diverticulosis of large intestine without perforation or abscess without bleeding: Secondary | ICD-10-CM | POA: Insufficient documentation

## 2012-05-23 DIAGNOSIS — C8330 Diffuse large B-cell lymphoma, unspecified site: Secondary | ICD-10-CM

## 2012-05-23 DIAGNOSIS — Z923 Personal history of irradiation: Secondary | ICD-10-CM | POA: Insufficient documentation

## 2012-05-23 DIAGNOSIS — C8583 Other specified types of non-Hodgkin lymphoma, intra-abdominal lymph nodes: Secondary | ICD-10-CM

## 2012-05-23 DIAGNOSIS — Z452 Encounter for adjustment and management of vascular access device: Secondary | ICD-10-CM

## 2012-05-23 DIAGNOSIS — I517 Cardiomegaly: Secondary | ICD-10-CM | POA: Insufficient documentation

## 2012-05-23 DIAGNOSIS — Z950 Presence of cardiac pacemaker: Secondary | ICD-10-CM | POA: Insufficient documentation

## 2012-05-23 DIAGNOSIS — C8590 Non-Hodgkin lymphoma, unspecified, unspecified site: Secondary | ICD-10-CM

## 2012-05-23 LAB — COMPREHENSIVE METABOLIC PANEL (CC13)
ALT: 25 U/L (ref 0–55)
AST: 16 U/L (ref 5–34)
Albumin: 3.5 g/dL (ref 3.5–5.0)
BUN: 17 mg/dL (ref 7.0–26.0)
CO2: 29 mEq/L (ref 22–29)
Calcium: 10.2 mg/dL (ref 8.4–10.4)
Chloride: 101 mEq/L (ref 98–107)
Potassium: 4 mEq/L (ref 3.5–5.1)

## 2012-05-23 LAB — CBC WITH DIFFERENTIAL/PLATELET
Basophils Absolute: 0.1 10*3/uL (ref 0.0–0.1)
EOS%: 3.6 % (ref 0.0–7.0)
HCT: 37.7 % (ref 34.8–46.6)
HGB: 12.8 g/dL (ref 11.6–15.9)
MCH: 29.6 pg (ref 25.1–34.0)
MONO#: 0.7 10*3/uL (ref 0.1–0.9)
NEUT#: 4.4 10*3/uL (ref 1.5–6.5)
NEUT%: 63.9 % (ref 38.4–76.8)
RDW: 15.2 % — ABNORMAL HIGH (ref 11.2–14.5)
WBC: 6.9 10*3/uL (ref 3.9–10.3)
lymph#: 1.5 10*3/uL (ref 0.9–3.3)

## 2012-05-23 LAB — LACTATE DEHYDROGENASE (CC13): LDH: 162 U/L (ref 125–245)

## 2012-05-23 MED ORDER — SODIUM CHLORIDE 0.9 % IJ SOLN
10.0000 mL | INTRAMUSCULAR | Status: DC | PRN
  Administered 2012-05-23: 10 mL via INTRAVENOUS
  Filled 2012-05-23: qty 10

## 2012-05-23 MED ORDER — IOHEXOL 300 MG/ML  SOLN
100.0000 mL | Freq: Once | INTRAMUSCULAR | Status: AC | PRN
  Administered 2012-05-23: 100 mL via INTRAVENOUS

## 2012-05-23 MED ORDER — IOHEXOL 300 MG/ML  SOLN
50.0000 mL | Freq: Once | INTRAMUSCULAR | Status: AC | PRN
  Administered 2012-05-23: 50 mL via ORAL

## 2012-05-23 MED ORDER — HEPARIN SOD (PORK) LOCK FLUSH 100 UNIT/ML IV SOLN
500.0000 [IU] | Freq: Once | INTRAVENOUS | Status: DC
  Filled 2012-05-23: qty 5

## 2012-05-23 NOTE — Telephone Encounter (Signed)
pt called to r/s Dr. Alfonzo Beers.Marland KitchenMarland KitchenMarland KitchenDone

## 2012-05-25 ENCOUNTER — Ambulatory Visit: Payer: Medicare Other | Admitting: Oncology

## 2012-06-08 ENCOUNTER — Telehealth: Payer: Self-pay | Admitting: Oncology

## 2012-06-08 ENCOUNTER — Ambulatory Visit (HOSPITAL_BASED_OUTPATIENT_CLINIC_OR_DEPARTMENT_OTHER): Payer: Medicare Other | Admitting: Oncology

## 2012-06-08 VITALS — BP 126/72 | HR 71 | Temp 97.4°F | Resp 22 | Ht <= 58 in | Wt 235.4 lb

## 2012-06-08 DIAGNOSIS — C8583 Other specified types of non-Hodgkin lymphoma, intra-abdominal lymph nodes: Secondary | ICD-10-CM

## 2012-06-08 DIAGNOSIS — I495 Sick sinus syndrome: Secondary | ICD-10-CM

## 2012-06-08 DIAGNOSIS — Z87898 Personal history of other specified conditions: Secondary | ICD-10-CM

## 2012-06-08 DIAGNOSIS — C8590 Non-Hodgkin lymphoma, unspecified, unspecified site: Secondary | ICD-10-CM

## 2012-06-08 DIAGNOSIS — I10 Essential (primary) hypertension: Secondary | ICD-10-CM

## 2012-06-08 DIAGNOSIS — E119 Type 2 diabetes mellitus without complications: Secondary | ICD-10-CM

## 2012-06-08 NOTE — Telephone Encounter (Signed)
gv and printed pt appt schedule for April thru Oct

## 2012-06-08 NOTE — Patient Instructions (Addendum)
1.  Diagnosis:  Recurrent high grade B-cell lymphoma. 2.  Prognosis:  Not good prognosis when this type of cancer returns.  In patients in good health, they would benefit in much stronger IV chemo followed by bone marrow transplant.  Given your poor health, bone marrow transplant is too toxic.  3.  Treatment options:  *  Symptoms control only.  Pain medications as needed when tumor grows larger and causes more pain.  *  Oral chemo therapy Revlimid and IV Rituxan:     - Response rate is about 20-30%.   - Potential side effects include but not limited to fatigue, skin rash, blood clot, low blood count, infection, bleeding.   *  IV chemo Bendamustine and IV Rituxan:   -  Response rate is about 60-70%.   - Potential side effects include but not limited to fatigue, skin rash, low blood count, infection, bleeding, nausea/vomiting, diarrhea.

## 2012-06-08 NOTE — Progress Notes (Signed)
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   PASTTHERAPY: q3wk R-CHOP x6 cycles; finished in Dec 2012; followed by consolidative radiation therapy.    CURRENT THERAPY: watchful observation.   INTERVAL HISTORY: [name] 71 y.o. female returns for regular follow up with her son's girlfriend.  She is not very active.  She depends on her wheelchair to move around.  She lives by herself and is able to transfer herself in the bathroom.   Patient denies fever, anorexia, weight loss, fatigue, headache, visual changes, confusion, drenching night sweats, palpable lymph node swelling, mucositis, odynophagia, dysphagia, nausea vomiting, jaundice, chest pain, palpitation, shortness of breath, dyspnea on exertion, productive cough, gum bleeding, epistaxis, hematemesis, hemoptysis, abdominal pain, abdominal swelling, early satiety, melena, hematochezia, hematuria, skin rash, spontaneous bleeding, joint swelling, joint pain, heat or cold intolerance, bowel bladder incontinence, back pain, focal motor weakness, paresthesia, depression.     Past Medical History  Diagnosis Date  . Sick sinus syndrome   . Hematoma     At the site of the pacemaker insertion.  . Hypotension   . Dyslipidemia   . Diabetes mellitus   . Pacemaker   . History of atrial fibrillation   . Arthritis     Osteoarthritis  . Hypothyroidism   . Port-a-cath in place 12/08/10  . Status post chemotherapy completed 03/2011    R - CHOP q 3 weeks x 6  . S/P radiation therapy 05/28/2011 - 06/26/2011    Right Abdomen and Right Pelvis/3060 cGy in 17 Fractions  . Diffuse large B cell lymphoma   . Cancer 10/2010    large B cell lymphoma    Past Surgical History  Procedure Laterality Date  . Pacemaker insertion      Lead revision completed August 04, 2006  . Cholecystectomy    . Tubal ligation      Bilateral    . Insert / replace / remove pacemaker    . Abdominal hysterectomy    . Biopsy stomach  11/03/10    Soft Tissue Mass, Biopsy, Right Lower Quadrant Mesenteric Mass- High Grade Non-Hodgkins B Cell Lymphoma  with Flow Cytometry  . Bone biopsy  11/24/10    Bone Marrow, Aspirate Biospy, and clot, Left - No Involvement of Non-Hodgkin's Lymphoma Identified    Current Outpatient Prescriptions  Medication Sig Dispense Refill  . Calcium Carbonate-Vitamin D (CALCIUM-VITAMIN D) 500-200 MG-UNIT per tablet Take 1 tablet by mouth 2 (two) times daily with a meal.      . citalopram (CELEXA) 20 MG tablet Take 20 mg by mouth daily.        Marland Kitchen diltiazem (CARDIZEM CD) 180 MG 24 hr capsule Take 180 mg by mouth daily.        Marland Kitchen levothyroxine (SYNTHROID, LEVOTHROID) 100 MCG tablet Take 100 mcg by mouth daily.        . metFORMIN (GLUCOPHAGE) 500 MG tablet Take 1,000 mg by mouth. 2 in the morning and 2 tablet at night      . pravastatin (PRAVACHOL) 20 MG tablet Take 20 mg by mouth daily.       Marland Kitchen warfarin (COUMADIN) 6 MG tablet Take 3-6 mg by mouth See admin instructions. Pt takes 1 tablet on  ( 6 mg) Tuesday,thursday,saturday,sunday,  1/2 tab ( 3 mg ) on Monday,wednesday, Friday,      . albuterol (PROVENTIL) (2.5 MG/3ML) 0.083% nebulizer solution Take 2.5  mg by nebulization every 6 (six) hours as needed.       No current facility-administered medications for this visit.    ALLERGIES:  is allergic to codeine.  REVIEW OF SYSTEMS:  The rest of the 14-point review of system was negative.   Filed Vitals:   06/08/12 1007  BP: 126/72  Pulse: 71  Temp: 97.4 F (36.3 C)  Resp: 22   Wt Readings from Last 3 Encounters:  06/08/12 235 lb 6.4 oz (106.777 kg)  01/20/12 225 lb 9.6 oz (102.331 kg)  10/13/11 219 lb 6.4 oz (99.519 kg)   ECOG Performance status: 2 due to obesity, osteoarthritis.   PHYSICAL EXAMINATION:  General:  Obese woman, in wheelchair, in no acute distress.  Eyes:  no scleral icterus.  ENT:  There  were no oropharyngeal lesions.  Neck was without thyromegaly.  Lymphatics:  Negative cervical, supraclavicular or axillary adenopathy.  Respiratory: lungs were clear bilaterally without wheezing or crackles.  Cardiovascular:  Regular rate and rhythm, S1/S2, without murmur, rub or gallop.  There was no pedal edema.  GI:  abdomen was soft, obese, nontender, nondistended, without organomegaly.  Muscoloskeletal:  no spinal tenderness of palpation of vertebral spine.  Skin exam was without echymosis, petichae.       LABORATORY/RADIOLOGY DATA:  Lab Results  Component Value Date   WBC 6.9 05/23/2012   HGB 12.8 05/23/2012   HCT 37.7 05/23/2012   PLT 287 05/23/2012   GLUCOSE 160* 05/23/2012   ALKPHOS 63 05/23/2012   ALT 25 05/23/2012   AST 16 05/23/2012   NA 141 05/23/2012   K 4.0 05/23/2012   CL 101 05/23/2012   CREATININE 1.2* 05/23/2012   BUN 17.0 05/23/2012   CO2 29 05/23/2012   INR 1.62* 04/07/2011    IMAGING:  I personally reviewed the following CT scan and showed the images to the patient and her relative:  CT abdomen 05/23/2012.  1. Significant interval enlargement of a retroperitoneal lymph  node adjacent to the IVC and infrarenal abdominal aorta, which  currently measures 3.9 x 2.9 cm. Findings are compatible with  progression of disease. No new lymphadenopathy is otherwise noted.  2. Colonic diverticulosis without evidence to suggest acute  diverticulitis at this time.  3. Status post cholecystectomy.  4. Cardiomegaly.     ASSESSMENT AND PLAN:    1. Recurrent high grade NHL:  Not good prognosis when this type of cancer returns.  In patients in good health, they would benefit in much stronger IV chemo followed by bone marrow transplant.  Given [name]'s poor health, bone marrow transplant is too toxic.  I offered to refer her to BMT program; but she deferred.    Treatment options:  *  Symptoms control only.  Pain medications as needed when tumor grows larger and causes more  pain.  *  Oral chemo therapy Revlimid and IV Rituxan:     - Response rate is about 20-30%.   - Potential side effects include but not limited to fatigue, skin rash, blood clot, low blood count, infection, bleeding.   *  IV chemo Bendamustine and IV Rituxan:   -  Response rate is about 60-70%.   - Potential side effects include but not limited to fatigue, skin rash, low blood count, infection, bleeding, nausea/vomiting, diarrhea.    2. Hypothyroidism: She has levothyroxine per PCP. 3. Hypertension: She is on diltiazem PCP.  4. Diabetes mellitus, type II: She is on metformin per PCP.  5. Hyperlipidemia: She is  on pravastatin per PCP.  6. History of atrial fibrillation: She is on diltiazem, and she has a pacemaker. She is now on Coumadin being managed per her PCP. 7. Follow with me in clinic in about 3 months.  She will discuss with her family members and call back to [place] if she decides to proceed with chemo now.    The length of time of the face-to-face encounter was 25 minutes. More than 50% of time was spent counseling and coordination of care.

## 2012-06-22 ENCOUNTER — Other Ambulatory Visit: Payer: Self-pay | Admitting: Oncology

## 2012-06-22 DIAGNOSIS — C8330 Diffuse large B-cell lymphoma, unspecified site: Secondary | ICD-10-CM

## 2012-06-26 ENCOUNTER — Other Ambulatory Visit: Payer: Self-pay | Admitting: Oncology

## 2012-06-26 ENCOUNTER — Encounter: Payer: Self-pay | Admitting: Oncology

## 2012-06-26 DIAGNOSIS — C8590 Non-Hodgkin lymphoma, unspecified, unspecified site: Secondary | ICD-10-CM

## 2012-06-26 HISTORY — DX: Non-Hodgkin lymphoma, unspecified, unspecified site: C85.90

## 2012-06-28 ENCOUNTER — Telehealth: Payer: Self-pay | Admitting: *Deleted

## 2012-06-28 NOTE — Telephone Encounter (Signed)
sw pt appts d/t was given. 

## 2012-06-29 ENCOUNTER — Ambulatory Visit (HOSPITAL_BASED_OUTPATIENT_CLINIC_OR_DEPARTMENT_OTHER): Payer: Medicare Other

## 2012-06-29 ENCOUNTER — Telehealth: Payer: Self-pay | Admitting: Oncology

## 2012-06-29 ENCOUNTER — Encounter: Payer: Self-pay | Admitting: Oncology

## 2012-06-29 ENCOUNTER — Other Ambulatory Visit (HOSPITAL_BASED_OUTPATIENT_CLINIC_OR_DEPARTMENT_OTHER): Payer: Medicare Other | Admitting: Lab

## 2012-06-29 ENCOUNTER — Ambulatory Visit (HOSPITAL_BASED_OUTPATIENT_CLINIC_OR_DEPARTMENT_OTHER): Payer: Medicare Other | Admitting: Oncology

## 2012-06-29 VITALS — BP 102/61 | HR 69 | Temp 97.0°F | Resp 22 | Ht <= 58 in | Wt 237.9 lb

## 2012-06-29 VITALS — BP 106/59 | HR 69 | Temp 98.6°F | Resp 20

## 2012-06-29 DIAGNOSIS — C8590 Non-Hodgkin lymphoma, unspecified, unspecified site: Secondary | ICD-10-CM

## 2012-06-29 DIAGNOSIS — C8589 Other specified types of non-Hodgkin lymphoma, extranodal and solid organ sites: Secondary | ICD-10-CM

## 2012-06-29 DIAGNOSIS — Z5112 Encounter for antineoplastic immunotherapy: Secondary | ICD-10-CM

## 2012-06-29 DIAGNOSIS — I10 Essential (primary) hypertension: Secondary | ICD-10-CM

## 2012-06-29 DIAGNOSIS — E119 Type 2 diabetes mellitus without complications: Secondary | ICD-10-CM

## 2012-06-29 DIAGNOSIS — C8330 Diffuse large B-cell lymphoma, unspecified site: Secondary | ICD-10-CM

## 2012-06-29 DIAGNOSIS — L299 Pruritus, unspecified: Secondary | ICD-10-CM

## 2012-06-29 DIAGNOSIS — I4891 Unspecified atrial fibrillation: Secondary | ICD-10-CM

## 2012-06-29 LAB — CBC WITH DIFFERENTIAL/PLATELET
Eosinophils Absolute: 0.3 10*3/uL (ref 0.0–0.5)
MONO#: 0.8 10*3/uL (ref 0.1–0.9)
NEUT#: 4.7 10*3/uL (ref 1.5–6.5)
RBC: 4.7 10*6/uL (ref 3.70–5.45)
RDW: 14.7 % — ABNORMAL HIGH (ref 11.2–14.5)
WBC: 7.1 10*3/uL (ref 3.9–10.3)

## 2012-06-29 LAB — COMPREHENSIVE METABOLIC PANEL (CC13)
ALT: 21 U/L (ref 0–55)
CO2: 27 mEq/L (ref 22–29)
Calcium: 10.4 mg/dL (ref 8.4–10.4)
Chloride: 103 mEq/L (ref 98–107)
Potassium: 4.2 mEq/L (ref 3.5–5.1)
Sodium: 142 mEq/L (ref 136–145)
Total Bilirubin: 0.33 mg/dL (ref 0.20–1.20)
Total Protein: 7.3 g/dL (ref 6.4–8.3)

## 2012-06-29 LAB — LACTATE DEHYDROGENASE (CC13): LDH: 198 U/L (ref 125–245)

## 2012-06-29 MED ORDER — SODIUM CHLORIDE 0.9 % IV SOLN
Freq: Once | INTRAVENOUS | Status: AC
  Administered 2012-06-29: 13:00:00 via INTRAVENOUS

## 2012-06-29 MED ORDER — SODIUM CHLORIDE 0.9 % IV SOLN
375.0000 mg/m2 | Freq: Once | INTRAVENOUS | Status: AC
  Administered 2012-06-29: 800 mg via INTRAVENOUS
  Filled 2012-06-29: qty 80

## 2012-06-29 MED ORDER — HEPARIN SOD (PORK) LOCK FLUSH 100 UNIT/ML IV SOLN
500.0000 [IU] | Freq: Once | INTRAVENOUS | Status: AC | PRN
  Administered 2012-06-29: 500 [IU]
  Filled 2012-06-29: qty 5

## 2012-06-29 MED ORDER — SODIUM CHLORIDE 0.9 % IJ SOLN
10.0000 mL | INTRAMUSCULAR | Status: DC | PRN
  Administered 2012-06-29: 10 mL
  Filled 2012-06-29: qty 10

## 2012-06-29 MED ORDER — DIPHENHYDRAMINE HCL 50 MG/ML IJ SOLN
25.0000 mg | Freq: Once | INTRAMUSCULAR | Status: AC | PRN
  Administered 2012-06-29: 25 mg via INTRAVENOUS

## 2012-06-29 MED ORDER — METHYLPREDNISOLONE SODIUM SUCC 125 MG IJ SOLR
125.0000 mg | Freq: Once | INTRAMUSCULAR | Status: AC | PRN
  Administered 2012-06-29: 125 mg via INTRAVENOUS

## 2012-06-29 MED ORDER — ONDANSETRON 8 MG/50ML IVPB (CHCC): 8.0000 mg | Freq: Once | INTRAVENOUS | Status: DC

## 2012-06-29 MED ORDER — ACETAMINOPHEN 325 MG PO TABS
650.0000 mg | ORAL_TABLET | Freq: Once | ORAL | Status: AC
  Administered 2012-06-29: 650 mg via ORAL

## 2012-06-29 MED ORDER — DEXAMETHASONE SODIUM PHOSPHATE 10 MG/ML IJ SOLN: 10.0000 mg | Freq: Once | INTRAMUSCULAR | Status: DC

## 2012-06-29 MED ORDER — DIPHENHYDRAMINE HCL 25 MG PO CAPS
50.0000 mg | ORAL_CAPSULE | Freq: Once | ORAL | Status: AC
  Administered 2012-06-29: 50 mg via ORAL

## 2012-06-29 NOTE — Telephone Encounter (Signed)
Gave pt appt for lab, md, chemo for March , April and May 2014

## 2012-06-29 NOTE — Progress Notes (Signed)
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   PASTTHERAPY: q3wk R-CHOP x6 cycles; finished in Dec 2012; followed by consolidative radiation therapy.    CURRENT THERAPY: Here to begin chemotherapy with Bendamustine and Rituxan..   INTERVAL HISTORY: [name] 72 y.o. female returns for regular follow up with her niece.  She is not very active.  She depends on her wheelchair to move around.  She lives by herself and is able to transfer herself in the bathroom.   Patient denies fever, anorexia, weight loss, fatigue, headache, visual changes, confusion, drenching night sweats, palpable lymph node swelling, mucositis, odynophagia, dysphagia, nausea vomiting, jaundice, chest pain, palpitation, shortness of breath, dyspnea on exertion, productive cough, gum bleeding, epistaxis, hematemesis, hemoptysis, abdominal pain, abdominal swelling, early satiety, melena, hematochezia, hematuria, skin rash, spontaneous bleeding, joint swelling, joint pain, heat or cold intolerance, bowel bladder incontinence, back pain, focal motor weakness, paresthesia, depression.     Past Medical History  Diagnosis Date  . Sick sinus syndrome   . Hematoma     At the site of the pacemaker insertion.  . Hypotension   . Dyslipidemia   . Diabetes mellitus   . Pacemaker   . History of atrial fibrillation   . Arthritis     Osteoarthritis  . Hypothyroidism   . Port-a-cath in place 12/08/10  . Status post chemotherapy completed 03/2011    R - CHOP q 3 weeks x 6  . S/P radiation therapy 05/28/2011 - 06/26/2011    Right Abdomen and Right Pelvis/3060 cGy in 17 Fractions  . Diffuse large B cell lymphoma   . Cancer 10/2010    large B cell lymphoma  . Recurrent lymphoma 06/26/2012    Past Surgical History  Procedure Laterality Date  . Pacemaker insertion      Lead revision completed August 04, 2006   . Cholecystectomy    . Tubal ligation      Bilateral  . Insert / replace / remove pacemaker    . Abdominal hysterectomy    . Biopsy stomach  11/03/10    Soft Tissue Mass, Biopsy, Right Lower Quadrant Mesenteric Mass- High Grade Non-Hodgkins B Cell Lymphoma  with Flow Cytometry  . Bone biopsy  11/24/10    Bone Marrow, Aspirate Biospy, and clot, Left - No Involvement of Non-Hodgkin's Lymphoma Identified    Current Outpatient Prescriptions  Medication Sig Dispense Refill  . albuterol (PROVENTIL) (2.5 MG/3ML) 0.083% nebulizer solution Take 2.5 mg by nebulization every 6 (six) hours as needed.      . Calcium Carbonate-Vitamin D (CALCIUM-VITAMIN D) 500-200 MG-UNIT per tablet Take 1 tablet by mouth 2 (two) times daily with a meal.      . citalopram (CELEXA) 20 MG tablet Take 20 mg by mouth daily.        Marland Kitchen diltiazem (CARDIZEM CD) 180 MG 24 hr capsule Take 180 mg by mouth daily.        Marland Kitchen levothyroxine (SYNTHROID, LEVOTHROID) 100 MCG tablet Take 100 mcg by mouth daily.        . metFORMIN (GLUCOPHAGE) 500 MG tablet Take 1,000 mg by mouth. 2 in the morning and 2 tablet at night      . pravastatin (PRAVACHOL) 20 MG tablet Take 20 mg by mouth daily.       Marland Kitchen warfarin (COUMADIN) 6 MG tablet Take 3-6 mg by mouth See admin instructions. Pt takes  1 tablet on  ( 6 mg) Tuesday,thursday,saturday,sunday,  1/2 tab ( 3 mg ) on Monday,wednesday, Friday,       No current facility-administered medications for this visit.   Facility-Administered Medications Ordered in Other Visits  Medication Dose Route Frequency Provider Last Rate Last Dose  . acetaminophen (TYLENOL) tablet 650 mg  650 mg Oral Once Exie Parody, MD      . dexamethasone (DECADRON) injection 10 mg  10 mg Intravenous Once Exie Parody, MD      . diphenhydrAMINE (BENADRYL) capsule 50 mg  50 mg Oral Once Exie Parody, MD      . heparin lock flush 100 unit/mL  500 Units Intracatheter Once PRN Exie Parody, MD      . ondansetron (ZOFRAN) IVPB 8 mg  8 mg Intravenous  Once Exie Parody, MD      . riTUXimab (RITUXAN) 800 mg in sodium chloride 0.9 % 250 mL chemo infusion  375 mg/m2 (Treatment Plan Actual) Intravenous Once Exie Parody, MD      . sodium chloride 0.9 % injection 10 mL  10 mL Intracatheter PRN Exie Parody, MD        ALLERGIES:  is allergic to codeine.  REVIEW OF SYSTEMS:  The rest of the 14-point review of system was negative.   Filed Vitals:   06/29/12 1047  BP: 102/61  Pulse: 69  Temp: 97 F (36.1 C)  Resp: 22   Wt Readings from Last 3 Encounters:  06/29/12 237 lb 14.4 oz (107.911 kg)  06/08/12 235 lb 6.4 oz (106.777 kg)  01/20/12 225 lb 9.6 oz (102.331 kg)   ECOG Performance status: 2 due to obesity, osteoarthritis.   PHYSICAL EXAMINATION:  General:  Obese woman, in wheelchair, in no acute distress.  Eyes:  no scleral icterus.  ENT:  There were no oropharyngeal lesions.  Neck was without thyromegaly.  Lymphatics:  Negative cervical, supraclavicular or axillary adenopathy.  Respiratory: lungs were clear bilaterally without wheezing or crackles.  Cardiovascular:  Regular rate and rhythm, S1/S2, without murmur, rub or gallop.  There was no pedal edema.  GI:  abdomen was soft, obese, nontender, nondistended, without organomegaly.  Muscoloskeletal:  no spinal tenderness of palpation of vertebral spine.  Skin exam was without echymosis, petichae.     LABORATORY/RADIOLOGY DATA:  Lab Results  Component Value Date   WBC 7.1 06/29/2012   HGB 13.7 06/29/2012   HCT 40.8 06/29/2012   PLT 311 06/29/2012   GLUCOSE 130* 06/29/2012   ALKPHOS 64 06/29/2012   ALT 21 06/29/2012   AST 16 06/29/2012   NA 142 06/29/2012   K 4.2 06/29/2012   CL 103 06/29/2012   CREATININE 1.1 06/29/2012   BUN 15.1 06/29/2012   CO2 27 06/29/2012   INR 1.62* 04/07/2011    ASSESSMENT AND PLAN:    1. Recurrent high grade NHL:  Not good prognosis when this type of cancer returns.  In patients in good health, they would benefit in much stronger IV chemo followed by bone marrow  transplant.  Given [name]'s poor health, bone marrow transplant is too toxic.  I offered to refer her to BMT program; but she deferred.  - She will proceed with cycle 1 of Bendamustine and Rituxan:   - Response rate is about 60-70%.   - Potential side effects include but not limited to fatigue, skin rash, low blood count, infection, bleeding, nausea/vomiting, diarrhea.    - The patient and her niece  expressed informed consent and wish to proceed.   2. Hypothyroidism: She has levothyroxine per PCP. 3. Hypertension: She is on diltiazem PCP.  4. Diabetes mellitus, type II: She is on metformin per PCP.  5. Hyperlipidemia: She is on pravastatin per PCP.  6. History of atrial fibrillation: She is on diltiazem, and she has a pacemaker. She is now on Coumadin being managed per her PCP. 7. Psychosocial: The patient's niece expressed concern about her living alone and having chemo. I have made a referral to SW to see if she qualifies for an aide to help her. 8. Follow with me in clinic in about 1 month prior to cycle 2.    The length of time of the face-to-face encounter was 25 minutes. More than 50% of time was spent counseling and coordination of care.

## 2012-06-29 NOTE — Progress Notes (Signed)
1515  -  Pt c/o throat itching, ears itching -  Stated " feels funny ".  Throat and ears mildly red; denied problems with breathing,  Remained A&O x 3.   Rituxan stopped at 1515.  NS running at 150cc/hr.  Dr. Gaylyn Rong notified.  Order received to monitor pt for 30 min and if symptoms resolved,  Pt can be rechallenged with Rituxan.  Explanations given to pt. 1530  -  Pt still c/o of itching slightly more.  Dr. Gaylyn Rong notified.  Order received to give Solumedrol 125 mg IVP.  Med given at  1533. 1550  -  Pt still c/o of heaviness in throat when swallowing.  NS still running at 150cc/hr.   Berneta Levins, charge nurse notified Belenda Cruise, NP .  Order received from [place], NP for Benadryl 25 mg IVP.   Rituxan can be resumed at lower rate of 100 mg/hr when symptoms resolved. 1615  -   Pt stated some relief of throat feeling heavy.  Pt asked for nourishments.   Rituxan restarted at rate 100 mg/hr as per order.  Pt continued to be monitored very closely.  Explanations given to pt of coming back on Thursday and   Fri for Treanda, and injection on Sat. 07/02/12.  AVS printed for pt. 1630  -  Dr. Gaylyn Rong came to assess pt in infusion room.

## 2012-06-29 NOTE — Patient Instructions (Addendum)
[facility] Discharge Instructions for Patients Receiving Chemotherapy  Today you received the following chemotherapy agents :  Rituxan.  To help prevent nausea and vomiting after your treatment, we encourage you to take your nausea medication as instructed by your physician.    If you develop nausea and vomiting that is not controlled by your nausea medication, call the clinic. If it is after clinic hours your family physician or the after hours number for the clinic or go to the Emergency Department.   BELOW ARE SYMPTOMS THAT SHOULD BE REPORTED IMMEDIATELY:  *FEVER GREATER THAN 100.5 F  *CHILLS WITH OR WITHOUT FEVER  NAUSEA AND VOMITING THAT IS NOT CONTROLLED WITH YOUR NAUSEA MEDICATION  *UNUSUAL SHORTNESS OF BREATH  *UNUSUAL BRUISING OR BLEEDING  TENDERNESS IN MOUTH AND THROAT WITH OR WITHOUT PRESENCE OF ULCERS  *URINARY PROBLEMS  *BOWEL PROBLEMS  UNUSUAL RASH Items with * indicate a potential emergency and should be followed up as soon as possible.  One of the nurses will contact you 24 hours after your treatment. Please let the nurse know about any problems that you may have experienced. Feel free to call the clinic you have any questions or concerns. The clinic phone number is [phone].   I have been informed and understand all the instructions given to me. I know to contact the clinic, my physician, or go to the Emergency Department if any problems should occur. I do not have any questions at this time, but understand that I may call the clinic during office hours   should I have any questions or need assistance in obtaining follow up care.    __________________________________________  _____________  __________ Signature of Patient or Authorized Representative            Date                   Time    __________________________________________ Nurse's Signature    

## 2012-06-30 ENCOUNTER — Encounter: Payer: Self-pay | Admitting: *Deleted

## 2012-06-30 ENCOUNTER — Other Ambulatory Visit: Payer: Self-pay | Admitting: Oncology

## 2012-06-30 ENCOUNTER — Ambulatory Visit (HOSPITAL_BASED_OUTPATIENT_CLINIC_OR_DEPARTMENT_OTHER): Payer: Medicare Other

## 2012-06-30 VITALS — BP 139/60 | HR 77 | Temp 97.7°F | Resp 19

## 2012-06-30 DIAGNOSIS — Z5111 Encounter for antineoplastic chemotherapy: Secondary | ICD-10-CM

## 2012-06-30 DIAGNOSIS — R112 Nausea with vomiting, unspecified: Secondary | ICD-10-CM

## 2012-06-30 DIAGNOSIS — C8589 Other specified types of non-Hodgkin lymphoma, extranodal and solid organ sites: Secondary | ICD-10-CM

## 2012-06-30 DIAGNOSIS — C8590 Non-Hodgkin lymphoma, unspecified, unspecified site: Secondary | ICD-10-CM

## 2012-06-30 DIAGNOSIS — C8330 Diffuse large B-cell lymphoma, unspecified site: Secondary | ICD-10-CM

## 2012-06-30 MED ORDER — SODIUM CHLORIDE 0.9 % IV SOLN
Freq: Once | INTRAVENOUS | Status: AC
  Administered 2012-06-30: 15:00:00 via INTRAVENOUS

## 2012-06-30 MED ORDER — ONDANSETRON 8 MG/50ML IVPB (CHCC)
8.0000 mg | Freq: Once | INTRAVENOUS | Status: AC
  Administered 2012-06-30: 8 mg via INTRAVENOUS

## 2012-06-30 MED ORDER — HEPARIN SOD (PORK) LOCK FLUSH 100 UNIT/ML IV SOLN
500.0000 [IU] | Freq: Once | INTRAVENOUS | Status: AC | PRN
  Administered 2012-06-30: 500 [IU]
  Filled 2012-06-30: qty 5

## 2012-06-30 MED ORDER — DEXAMETHASONE SODIUM PHOSPHATE 10 MG/ML IJ SOLN
10.0000 mg | Freq: Once | INTRAMUSCULAR | Status: AC
  Administered 2012-06-30: 10 mg via INTRAVENOUS

## 2012-06-30 MED ORDER — SODIUM CHLORIDE 0.9 % IJ SOLN
10.0000 mL | INTRAMUSCULAR | Status: DC | PRN
  Administered 2012-06-30: 10 mL
  Filled 2012-06-30: qty 10

## 2012-06-30 MED ORDER — ONDANSETRON HCL 8 MG PO TABS: 8.0000 mg | ORAL_TABLET | Freq: Two times a day (BID) | ORAL | Status: DC | PRN

## 2012-06-30 MED ORDER — LIDOCAINE-PRILOCAINE 2.5-2.5 % EX CREA: TOPICAL_CREAM | CUTANEOUS | Status: DC | PRN

## 2012-06-30 MED ORDER — SODIUM CHLORIDE 0.9 % IV SOLN
67.5000 mg/m2 | Freq: Once | INTRAVENOUS | Status: AC
  Administered 2012-06-30: 140 mg via INTRAVENOUS
  Filled 2012-06-30: qty 28

## 2012-06-30 MED ORDER — PROCHLORPERAZINE MALEATE 10 MG PO TABS: 10.0000 mg | ORAL_TABLET | Freq: Four times a day (QID) | ORAL | Status: DC | PRN

## 2012-06-30 NOTE — Progress Notes (Signed)
Faxed completed Personal Care Services request form to [facility] at fax# [phone].

## 2012-06-30 NOTE — Patient Instructions (Signed)
[facility] Discharge Instructions for Patients Receiving Chemotherapy  Today you received the following chemotherapy agents Treanda To help prevent nausea and vomiting after your treatment, we encourage you to take your nausea medication as prescribed.  If you develop nausea and vomiting that is not controlled by your nausea medication, call the clinic. If it is after clinic hours your family physician or the after hours number for the clinic or go to the Emergency Department.   BELOW ARE SYMPTOMS THAT SHOULD BE REPORTED IMMEDIATELY:  *FEVER GREATER THAN 100.5 F  *CHILLS WITH OR WITHOUT FEVER  NAUSEA AND VOMITING THAT IS NOT CONTROLLED WITH YOUR NAUSEA MEDICATION  *UNUSUAL SHORTNESS OF BREATH  *UNUSUAL BRUISING OR BLEEDING  TENDERNESS IN MOUTH AND THROAT WITH OR WITHOUT PRESENCE OF ULCERS  *URINARY PROBLEMS  *BOWEL PROBLEMS  UNUSUAL RASH Items with * indicate a potential emergency and should be followed up as soon as possible.  One of the nurses will contact you 24 hours after your treatment. Please let the nurse know about any problems that you may have experienced. Feel free to call the clinic you have any questions or concerns. The clinic phone number is [phone].   I have been informed and understand all the instructions given to me. I know to contact the clinic, my physician, or go to the Emergency Department if any problems should occur. I do not have any questions at this time, but understand that I may call the clinic during office hours   should I have any questions or need assistance in obtaining follow up care.    __________________________________________  _____________  __________ Signature of Patient or Authorized Representative            Date                   Time    __________________________________________ Nurse's Signature

## 2012-07-01 ENCOUNTER — Ambulatory Visit (HOSPITAL_BASED_OUTPATIENT_CLINIC_OR_DEPARTMENT_OTHER): Payer: Medicare Other

## 2012-07-01 ENCOUNTER — Ambulatory Visit: Payer: Medicare Other

## 2012-07-01 VITALS — BP 152/77 | HR 73 | Temp 97.9°F

## 2012-07-01 DIAGNOSIS — C8589 Other specified types of non-Hodgkin lymphoma, extranodal and solid organ sites: Secondary | ICD-10-CM

## 2012-07-01 DIAGNOSIS — C8330 Diffuse large B-cell lymphoma, unspecified site: Secondary | ICD-10-CM

## 2012-07-01 DIAGNOSIS — Z5111 Encounter for antineoplastic chemotherapy: Secondary | ICD-10-CM

## 2012-07-01 MED ORDER — HEPARIN SOD (PORK) LOCK FLUSH 100 UNIT/ML IV SOLN
500.0000 [IU] | Freq: Once | INTRAVENOUS | Status: AC | PRN
  Administered 2012-07-01: 500 [IU]
  Filled 2012-07-01: qty 5

## 2012-07-01 MED ORDER — SODIUM CHLORIDE 0.9 % IJ SOLN
10.0000 mL | INTRAMUSCULAR | Status: DC | PRN
  Administered 2012-07-01: 10 mL
  Filled 2012-07-01: qty 10

## 2012-07-01 MED ORDER — ONDANSETRON 8 MG/50ML IVPB (CHCC)
8.0000 mg | Freq: Once | INTRAVENOUS | Status: AC
  Administered 2012-07-01: 8 mg via INTRAVENOUS

## 2012-07-01 MED ORDER — DEXAMETHASONE SODIUM PHOSPHATE 10 MG/ML IJ SOLN
10.0000 mg | Freq: Once | INTRAMUSCULAR | Status: AC
  Administered 2012-07-01: 10 mg via INTRAVENOUS

## 2012-07-01 MED ORDER — SODIUM CHLORIDE 0.9 % IV SOLN
67.5000 mg/m2 | Freq: Once | INTRAVENOUS | Status: AC
  Administered 2012-07-01: 140 mg via INTRAVENOUS
  Filled 2012-07-01: qty 28

## 2012-07-01 MED ORDER — SODIUM CHLORIDE 0.9 % IV SOLN
Freq: Once | INTRAVENOUS | Status: AC
  Administered 2012-07-01: 15:00:00 via INTRAVENOUS

## 2012-07-01 NOTE — Patient Instructions (Addendum)
[facility] Discharge Instructions for Patients Receiving Chemotherapy  Today you received the following chemotherapy agents: Treanda  To help prevent nausea and vomiting after your treatment, we encourage you to take your nausea medication as directed by your MD.  If you develop nausea and vomiting that is not controlled by your nausea medication, call the clinic. If it is after clinic hours your family physician or the after hours number for the clinic or go to the Emergency Department.   BELOW ARE SYMPTOMS THAT SHOULD BE REPORTED IMMEDIATELY:  *FEVER GREATER THAN 100.5 F  *CHILLS WITH OR WITHOUT FEVER  NAUSEA AND VOMITING THAT IS NOT CONTROLLED WITH YOUR NAUSEA MEDICATION  *UNUSUAL SHORTNESS OF BREATH  *UNUSUAL BRUISING OR BLEEDING  TENDERNESS IN MOUTH AND THROAT WITH OR WITHOUT PRESENCE OF ULCERS  *URINARY PROBLEMS  *BOWEL PROBLEMS  UNUSUAL RASH Items with * indicate a potential emergency and should be followed up as soon as possible.  Feel free to call the clinic you have any questions or concerns. The clinic phone number is [phone].

## 2012-07-02 ENCOUNTER — Ambulatory Visit (HOSPITAL_BASED_OUTPATIENT_CLINIC_OR_DEPARTMENT_OTHER): Payer: Medicare Other

## 2012-07-02 VITALS — BP 143/78 | HR 70 | Temp 97.4°F

## 2012-07-02 DIAGNOSIS — C8589 Other specified types of non-Hodgkin lymphoma, extranodal and solid organ sites: Secondary | ICD-10-CM

## 2012-07-02 DIAGNOSIS — C8330 Diffuse large B-cell lymphoma, unspecified site: Secondary | ICD-10-CM

## 2012-07-02 DIAGNOSIS — Z5189 Encounter for other specified aftercare: Secondary | ICD-10-CM

## 2012-07-02 MED ORDER — PEGFILGRASTIM INJECTION 6 MG/0.6ML
6.0000 mg | Freq: Once | SUBCUTANEOUS | Status: AC
  Administered 2012-07-02: 6 mg via SUBCUTANEOUS

## 2012-07-02 NOTE — Telephone Encounter (Signed)
none

## 2012-07-04 ENCOUNTER — Other Ambulatory Visit: Payer: Self-pay | Admitting: Certified Registered Nurse Anesthetist

## 2012-07-25 ENCOUNTER — Other Ambulatory Visit: Payer: Self-pay | Admitting: Oncology

## 2012-07-27 ENCOUNTER — Ambulatory Visit (HOSPITAL_BASED_OUTPATIENT_CLINIC_OR_DEPARTMENT_OTHER): Payer: Medicare Other

## 2012-07-27 ENCOUNTER — Encounter: Payer: Self-pay | Admitting: Oncology

## 2012-07-27 ENCOUNTER — Telehealth: Payer: Self-pay | Admitting: Oncology

## 2012-07-27 ENCOUNTER — Ambulatory Visit (HOSPITAL_BASED_OUTPATIENT_CLINIC_OR_DEPARTMENT_OTHER): Payer: Medicare Other | Admitting: Oncology

## 2012-07-27 ENCOUNTER — Other Ambulatory Visit (HOSPITAL_BASED_OUTPATIENT_CLINIC_OR_DEPARTMENT_OTHER): Payer: Medicare Other | Admitting: Lab

## 2012-07-27 ENCOUNTER — Telehealth: Payer: Self-pay | Admitting: *Deleted

## 2012-07-27 VITALS — BP 128/77 | HR 70 | Temp 97.4°F | Resp 18

## 2012-07-27 VITALS — BP 118/70 | HR 69 | Temp 97.3°F | Resp 20 | Ht <= 58 in | Wt 239.9 lb

## 2012-07-27 DIAGNOSIS — I4891 Unspecified atrial fibrillation: Secondary | ICD-10-CM

## 2012-07-27 DIAGNOSIS — Z5112 Encounter for antineoplastic immunotherapy: Secondary | ICD-10-CM

## 2012-07-27 DIAGNOSIS — C8330 Diffuse large B-cell lymphoma, unspecified site: Secondary | ICD-10-CM

## 2012-07-27 DIAGNOSIS — C8583 Other specified types of non-Hodgkin lymphoma, intra-abdominal lymph nodes: Secondary | ICD-10-CM

## 2012-07-27 DIAGNOSIS — E119 Type 2 diabetes mellitus without complications: Secondary | ICD-10-CM

## 2012-07-27 DIAGNOSIS — Z5111 Encounter for antineoplastic chemotherapy: Secondary | ICD-10-CM

## 2012-07-27 DIAGNOSIS — C8590 Non-Hodgkin lymphoma, unspecified, unspecified site: Secondary | ICD-10-CM

## 2012-07-27 LAB — LACTATE DEHYDROGENASE (CC13): LDH: 181 U/L (ref 125–245)

## 2012-07-27 LAB — CBC WITH DIFFERENTIAL/PLATELET
BASO%: 1.3 % (ref 0.0–2.0)
LYMPH%: 8.9 % — ABNORMAL LOW (ref 14.0–49.7)
MCHC: 33.9 g/dL (ref 31.5–36.0)
MONO#: 0.9 10*3/uL (ref 0.1–0.9)
Platelets: 287 10*3/uL (ref 145–400)
RBC: 4.42 10*6/uL (ref 3.70–5.45)
WBC: 7.1 10*3/uL (ref 3.9–10.3)

## 2012-07-27 LAB — COMPREHENSIVE METABOLIC PANEL (CC13)
ALT: 20 U/L (ref 0–55)
AST: 16 U/L (ref 5–34)
Alkaline Phosphatase: 67 U/L (ref 40–150)
CO2: 28 mEq/L (ref 22–29)
Sodium: 141 mEq/L (ref 136–145)
Total Bilirubin: 0.34 mg/dL (ref 0.20–1.20)
Total Protein: 7 g/dL (ref 6.4–8.3)

## 2012-07-27 MED ORDER — ACETAMINOPHEN 325 MG PO TABS
650.0000 mg | ORAL_TABLET | Freq: Once | ORAL | Status: AC
  Administered 2012-07-27: 650 mg via ORAL

## 2012-07-27 MED ORDER — METHYLPREDNISOLONE SODIUM SUCC 125 MG IJ SOLR
125.0000 mg | Freq: Once | INTRAMUSCULAR | Status: AC
  Administered 2012-07-27: 125 mg via INTRAVENOUS

## 2012-07-27 MED ORDER — HEPARIN SOD (PORK) LOCK FLUSH 100 UNIT/ML IV SOLN
500.0000 [IU] | Freq: Once | INTRAVENOUS | Status: AC | PRN
  Administered 2012-07-27: 500 [IU]
  Filled 2012-07-27: qty 5

## 2012-07-27 MED ORDER — ONDANSETRON 8 MG/50ML IVPB (CHCC)
8.0000 mg | Freq: Once | INTRAVENOUS | Status: AC
  Administered 2012-07-27: 8 mg via INTRAVENOUS

## 2012-07-27 MED ORDER — SODIUM CHLORIDE 0.9 % IJ SOLN
10.0000 mL | INTRAMUSCULAR | Status: DC | PRN
  Administered 2012-07-27: 10 mL
  Filled 2012-07-27: qty 10

## 2012-07-27 MED ORDER — SODIUM CHLORIDE 0.9 % IV SOLN
375.0000 mg/m2 | Freq: Once | INTRAVENOUS | Status: AC
  Administered 2012-07-27: 800 mg via INTRAVENOUS
  Filled 2012-07-27: qty 80

## 2012-07-27 MED ORDER — DIPHENHYDRAMINE HCL 25 MG PO CAPS
50.0000 mg | ORAL_CAPSULE | Freq: Once | ORAL | Status: AC
  Administered 2012-07-27: 50 mg via ORAL

## 2012-07-27 MED ORDER — SODIUM CHLORIDE 0.9 % IV SOLN
Freq: Once | INTRAVENOUS | Status: AC
  Administered 2012-07-27: 11:00:00 via INTRAVENOUS

## 2012-07-27 MED ORDER — DEXAMETHASONE SODIUM PHOSPHATE 10 MG/ML IJ SOLN
10.0000 mg | Freq: Once | INTRAMUSCULAR | Status: AC
  Administered 2012-07-27: 10 mg via INTRAVENOUS

## 2012-07-27 MED ORDER — SODIUM CHLORIDE 0.9 % IV SOLN
67.5000 mg/m2 | Freq: Once | INTRAVENOUS | Status: AC
  Administered 2012-07-27: 140 mg via INTRAVENOUS
  Filled 2012-07-27: qty 28

## 2012-07-27 NOTE — Patient Instructions (Addendum)
1.  Diagnosis:  Recurrent lymphoma. 2.  Treatment:  Bendamustine d1, d2; Rituxan d1; Neultasta d3.  3.  Recommendation:  Proceed with cycle #2 of chemo today.

## 2012-07-27 NOTE — Telephone Encounter (Signed)
gv and printed appt sched and avs for pt...emailed michelle to add tx.Marland KitchenMarland KitchenMarland KitchenMarland Kitchenpt aware.Marland KitchenMarland KitchenMarland Kitchen

## 2012-07-27 NOTE — Progress Notes (Signed)
I gave patient an application for assistance with [place] and medicaid application. She will bring back 07/28/12,

## 2012-07-27 NOTE — Patient Instructions (Addendum)
[facility] Discharge Instructions for Patients Receiving Chemotherapy  Today you received the following chemotherapy agents Rituxan and Treanda.  To help prevent nausea and vomiting after your treatment, we encourage you to take your nausea medication as prescribed.    If you develop nausea and vomiting that is not controlled by your nausea medication, call the clinic. If it is after clinic hours your family physician or the after hours number for the clinic or go to the Emergency Department.   BELOW ARE SYMPTOMS THAT SHOULD BE REPORTED IMMEDIATELY:  *FEVER GREATER THAN 100.5 F  *CHILLS WITH OR WITHOUT FEVER  NAUSEA AND VOMITING THAT IS NOT CONTROLLED WITH YOUR NAUSEA MEDICATION  *UNUSUAL SHORTNESS OF BREATH  *UNUSUAL BRUISING OR BLEEDING  TENDERNESS IN MOUTH AND THROAT WITH OR WITHOUT PRESENCE OF ULCERS  *URINARY PROBLEMS  *BOWEL PROBLEMS  UNUSUAL RASH Items with * indicate a potential emergency and should be followed up as soon as possible.  Please let the nurse know about any problems that you may have experienced. Feel free to call the clinic you have any questions or concerns. The clinic phone number is [phone].   I have been informed and understand all the instructions given to me. I know to contact the clinic, my physician, or go to the Emergency Department if any problems should occur. I do not have any questions at this time, but understand that I may call the clinic during office hours   should I have any questions or need assistance in obtaining follow up care.    __________________________________________  _____________  __________ Signature of Patient or Authorized Representative            Date                   Time    __________________________________________ Nurse's Signature

## 2012-07-27 NOTE — Progress Notes (Signed)
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   PASTTHERAPY: q3wk R-CHOP x6 cycles; finished in Dec 2012; followed by consolidative radiation therapy.    CURRENT THERAPY: recurrent disease; started on bendamustine/Rituxan on 06/29/2012.   INTERVAL HISTORY: [name] 72 y.o. female returns for regular follow up with her 2 nieces. She reports doing relatively well on chemotherapy. She had mild fatigue. She is still able to live by herself. Her nieces think that is best for her to have some kind of home health. She denies fever, mucositis, nausea vomiting, abdominal pain, leading, skin rash, dysuria, pyuria. Appetite is still good and in fact she is gaining weight instead of losing weight on chemotherapy. The rest of the 14 point review of system was negative.    Past Medical History  Diagnosis Date  . Sick sinus syndrome   . Hematoma     At the site of the pacemaker insertion.  . Hypotension   . Dyslipidemia   . Diabetes mellitus   . Pacemaker   . History of atrial fibrillation   . Arthritis     Osteoarthritis  . Hypothyroidism   . Port-a-cath in place 12/08/10  . Status post chemotherapy completed 03/2011    R - CHOP q 3 weeks x 6  . S/P radiation therapy 05/28/2011 - 06/26/2011    Right Abdomen and Right Pelvis/3060 cGy in 17 Fractions  . Diffuse large B cell lymphoma   . Cancer 10/2010    large B cell lymphoma  . 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 Pacemaker insertion      Lead revision completed August 04, 2006  . Cholecystectomy    . Tubal ligation      Bilateral  . Insert / replace / remove pacemaker    . Abdominal hysterectomy    . Biopsy stomach  11/03/10    Soft Tissue Mass, Biopsy, Right Lower Quadrant Mesenteric Mass- High Grade Non-Hodgkins B  Cell Lymphoma  with Flow Cytometry  . Bone biopsy  11/24/10    Bone Marrow, Aspirate Biospy, and clot, Left - No Involvement of Non-Hodgkin's Lymphoma Identified    Current Outpatient Prescriptions  Medication Sig Dispense Refill  . albuterol (PROVENTIL) (2.5 MG/3ML) 0.083% nebulizer solution Take 2.5 mg by nebulization every 6 (six) hours as needed.      . Calcium Carbonate-Vitamin D (CALCIUM-VITAMIN D) 500-200 MG-UNIT per tablet Take 1 tablet by mouth 2 (two) times daily with a meal.      . citalopram (CELEXA) 20 MG tablet Take 20 mg by mouth daily.        Marland Kitchen diltiazem (CARDIZEM CD) 180 MG 24 hr capsule Take 180 mg by mouth daily.        Marland Kitchen levothyroxine (SYNTHROID, LEVOTHROID) 100 MCG tablet Take 100 mcg by mouth daily.        Marland Kitchen lidocaine-prilocaine (EMLA) cream Apply topically as needed. Apply one hour before procedure as directed.  30 g  0  . metFORMIN (GLUCOPHAGE) 500 MG tablet Take 1,000 mg by mouth. 2 in the morning and 2 tablet at night      . ondansetron (ZOFRAN) 8 MG tablet Take 1 tablet (8 mg total) by mouth every 12 (twelve) hours as needed for nausea.  20 tablet  3  . pravastatin (PRAVACHOL) 20 MG tablet  Take 20 mg by mouth daily.       . prochlorperazine (COMPAZINE) 10 MG tablet Take 1 tablet (10 mg total) by mouth every 6 (six) hours as needed.  30 tablet  3  . warfarin (COUMADIN) 6 MG tablet Take 3-6 mg by mouth See admin instructions. Pt takes 1 tablet on  ( 6 mg) Tuesday,thursday,saturday,sunday,  1/2 tab ( 3 mg ) on Monday,wednesday, Friday,       No current facility-administered medications for this visit.   Facility-Administered Medications Ordered in Other Visits  Medication Dose Route Frequency Provider Last Rate Last Dose  . bendamustine (TREANDA) 140 mg in sodium chloride 0.9 % 500 mL chemo infusion  67.5 mg/m2 (Treatment Plan Actual) Intravenous Once Exie Parody, MD      . dexamethasone (DECADRON) injection 10 mg  10 mg Intravenous Once Exie Parody, MD      . heparin lock  flush 100 unit/mL  500 Units Intracatheter Once PRN Exie Parody, MD      . ondansetron (ZOFRAN) IVPB 8 mg  8 mg Intravenous Once Exie Parody, MD      . sodium chloride 0.9 % injection 10 mL  10 mL Intracatheter PRN Exie Parody, MD        ALLERGIES:  is allergic to codeine.  REVIEW OF SYSTEMS:  The rest of the 14-point review of system was negative.   Filed Vitals:   07/27/12 0850  BP: 118/70  Pulse: 69  Temp: 97.3 F (36.3 C)  Resp: 20   Wt Readings from Last 3 Encounters:  07/27/12 239 lb 14.4 oz (108.818 kg)  06/29/12 237 lb 14.4 oz (107.911 kg)  06/08/12 235 lb 6.4 oz (106.777 kg)   ECOG Performance status: 2 due to obesity, osteoarthritis.   PHYSICAL EXAMINATION:  General:  Obese woman, in wheelchair, in no acute distress.  Eyes:  no scleral icterus.  ENT:  There were no oropharyngeal lesions.  Neck was without thyromegaly.  Lymphatics:  Negative cervical, supraclavicular or axillary adenopathy.  Respiratory: lungs were clear bilaterally without wheezing or crackles.  Cardiovascular:  Regular rate and rhythm, S1/S2, without murmur, rub or gallop.  There was no pedal edema.  GI:  abdomen was soft, obese, nontender, nondistended, without organomegaly.  Muscoloskeletal:  no spinal tenderness of palpation of vertebral spine.  Skin exam was without echymosis, petichae.       LABORATORY/RADIOLOGY DATA:  Lab Results  Component Value Date   WBC 7.1 07/27/2012   HGB 13.1 07/27/2012   HCT 38.7 07/27/2012   PLT 287 07/27/2012   GLUCOSE 138* 07/27/2012   ALKPHOS 67 07/27/2012   ALT 20 07/27/2012   AST 16 07/27/2012   NA 141 07/27/2012   K 4.0 07/27/2012   CL 102 07/27/2012   CREATININE 1.0 07/27/2012   BUN 15.3 07/27/2012   CO2 28 07/27/2012   INR 1.62* 04/07/2011    ASSESSMENT AND PLAN:    1. Recurrent high grade NHL: -Status post 1 cycle of bendamustine Rituxan with good toleration without any dose-limiting toxicity. She had grade 1 fatigue. I recommended to proceed with the second  cycle of chemotherapy without dose modification. Plan is for 3 cycles after restaging CT scan of the abdomen.   2. Hypothyroidism: She has levothyroxine per PCP. 3. Hypertension: She is on diltiazem PCP.  4. Diabetes mellitus, type II: She is on metformin per PCP.  5. Hyperlipidemia: She is on pravastatin per PCP.  6. History of atrial fibrillation:  She is on diltiazem, and she has a pacemaker. She is on Coumadin being managed per her PCP. 7. Social issue: I appreciate social help in discussion with her today to see what her options include especially for home health. 8. Follow in about 4 weeks prior to the third cycle of chemotherapy.   The length of time of the face-to-face encounter was 25 minutes. More than 50% of time was spent counseling and coordination of care.        Huan T. Gaylyn Rong, M.D.

## 2012-07-27 NOTE — Telephone Encounter (Signed)
Per staff message and POF I have scheduled appts.  JMW  

## 2012-07-28 ENCOUNTER — Ambulatory Visit (HOSPITAL_BASED_OUTPATIENT_CLINIC_OR_DEPARTMENT_OTHER): Payer: Medicare Other

## 2012-07-28 VITALS — BP 137/86 | HR 81 | Temp 97.8°F | Resp 17

## 2012-07-28 DIAGNOSIS — C8583 Other specified types of non-Hodgkin lymphoma, intra-abdominal lymph nodes: Secondary | ICD-10-CM

## 2012-07-28 DIAGNOSIS — Z5111 Encounter for antineoplastic chemotherapy: Secondary | ICD-10-CM

## 2012-07-28 DIAGNOSIS — Z452 Encounter for adjustment and management of vascular access device: Secondary | ICD-10-CM

## 2012-07-28 DIAGNOSIS — C8330 Diffuse large B-cell lymphoma, unspecified site: Secondary | ICD-10-CM

## 2012-07-28 MED ORDER — DEXAMETHASONE SODIUM PHOSPHATE 10 MG/ML IJ SOLN
10.0000 mg | Freq: Once | INTRAMUSCULAR | Status: AC
  Administered 2012-07-28: 10 mg via INTRAVENOUS

## 2012-07-28 MED ORDER — ALTEPLASE 2 MG IJ SOLR
2.0000 mg | Freq: Once | INTRAMUSCULAR | Status: AC | PRN
  Administered 2012-07-28: 2 mg
  Filled 2012-07-28: qty 2

## 2012-07-28 MED ORDER — SODIUM CHLORIDE 0.9 % IV SOLN
Freq: Once | INTRAVENOUS | Status: AC
  Administered 2012-07-28: 16:00:00 via INTRAVENOUS

## 2012-07-28 MED ORDER — SODIUM CHLORIDE 0.9 % IV SOLN
67.5000 mg/m2 | Freq: Once | INTRAVENOUS | Status: AC
  Administered 2012-07-28: 140 mg via INTRAVENOUS
  Filled 2012-07-28: qty 28

## 2012-07-28 MED ORDER — HEPARIN SOD (PORK) LOCK FLUSH 100 UNIT/ML IV SOLN
500.0000 [IU] | Freq: Once | INTRAVENOUS | Status: DC | PRN
  Filled 2012-07-28: qty 5

## 2012-07-28 MED ORDER — ONDANSETRON 8 MG/50ML IVPB (CHCC)
8.0000 mg | Freq: Once | INTRAVENOUS | Status: AC
  Administered 2012-07-28: 8 mg via INTRAVENOUS

## 2012-07-28 MED ORDER — SODIUM CHLORIDE 0.9 % IJ SOLN
10.0000 mL | INTRAMUSCULAR | Status: DC | PRN
  Filled 2012-07-28: qty 10

## 2012-07-28 NOTE — Progress Notes (Signed)
Patient back to infusion room for treatment. Port-a-cath accessed with no blood return. Verified placement with two nurses. Patient  Stated, " that stings and burns."  Per Dr. Gaylyn Rong, TPA the port and start a PIV for treatment. Orders received and carried out.

## 2012-07-28 NOTE — Patient Instructions (Addendum)
[facility] Discharge Instructions for Patients Receiving Chemotherapy  Today you received the following chemotherapy agents Treanda.  To help prevent nausea and vomiting after your treatment, we encourage you to take your nausea medication as prescribed.   If you develop nausea and vomiting that is not controlled by your nausea medication, call the clinic. If it is after clinic hours your family physician or the after hours number for the clinic or go to the Emergency Department.   BELOW ARE SYMPTOMS THAT SHOULD BE REPORTED IMMEDIATELY:  *FEVER GREATER THAN 100.5 F  *CHILLS WITH OR WITHOUT FEVER  NAUSEA AND VOMITING THAT IS NOT CONTROLLED WITH YOUR NAUSEA MEDICATION  *UNUSUAL SHORTNESS OF BREATH  *UNUSUAL BRUISING OR BLEEDING  TENDERNESS IN MOUTH AND THROAT WITH OR WITHOUT PRESENCE OF ULCERS  *URINARY PROBLEMS  *BOWEL PROBLEMS  UNUSUAL RASH Items with * indicate a potential emergency and should be followed up as soon as possible.  Feel free to call the clinic you have any questions or concerns. The clinic phone number is [phone].   I have been informed and understand all the instructions given to me. I know to contact the clinic, my physician, or go to the Emergency Department if any problems should occur. I do not have any questions at this time, but understand that I may call the clinic during office hours   should I have any questions or need assistance in obtaining follow up care.    __________________________________________  _____________  __________ Signature of Patient or Authorized Representative            Date                   Time    __________________________________________ Nurse's Signature

## 2012-07-28 NOTE — Progress Notes (Signed)
1715 Cathflo removed with no blood return from [facility]. Dr Milta Deiters desk nurse informed. Patient knows to expect call from IR to schedule appt for dye study.

## 2012-07-29 ENCOUNTER — Ambulatory Visit (HOSPITAL_BASED_OUTPATIENT_CLINIC_OR_DEPARTMENT_OTHER): Payer: Medicare Other

## 2012-07-29 VITALS — BP 124/74 | HR 74 | Temp 98.3°F

## 2012-07-29 DIAGNOSIS — C8589 Other specified types of non-Hodgkin lymphoma, extranodal and solid organ sites: Secondary | ICD-10-CM

## 2012-07-29 DIAGNOSIS — Z5189 Encounter for other specified aftercare: Secondary | ICD-10-CM

## 2012-07-29 DIAGNOSIS — C8330 Diffuse large B-cell lymphoma, unspecified site: Secondary | ICD-10-CM

## 2012-07-29 MED ORDER — PEGFILGRASTIM INJECTION 6 MG/0.6ML
6.0000 mg | Freq: Once | SUBCUTANEOUS | Status: AC
  Administered 2012-07-29: 6 mg via SUBCUTANEOUS
  Filled 2012-07-29: qty 0.6

## 2012-07-29 NOTE — Patient Instructions (Signed)
Call MD for problems 

## 2012-08-01 ENCOUNTER — Encounter: Payer: Self-pay | Admitting: Oncology

## 2012-08-01 NOTE — Progress Notes (Signed)
100%ind 08/01/12-01/31/13. I will send the card and letter to the patient. I spoke with [name] at [facility] and the patient had medicaid that is good thru 12/11/12. I will call and let her niece [name] know.

## 2012-08-04 ENCOUNTER — Ambulatory Visit (HOSPITAL_COMMUNITY)
Admission: RE | Admit: 2012-08-04 | Discharge: 2012-08-04 | Disposition: A | Discharge status: Home / Self Care | Payer: Medicare Other | Source: Ambulatory Visit | Attending: Oncology | Admitting: Oncology

## 2012-08-04 ENCOUNTER — Encounter: Payer: Self-pay | Admitting: *Deleted

## 2012-08-04 DIAGNOSIS — C8330 Diffuse large B-cell lymphoma, unspecified site: Secondary | ICD-10-CM

## 2012-08-04 DIAGNOSIS — T82898A Other specified complication of vascular prosthetic devices, implants and grafts, initial encounter: Secondary | ICD-10-CM | POA: Insufficient documentation

## 2012-08-04 DIAGNOSIS — Y849 Medical procedure, unspecified as the cause of abnormal reaction of the patient, or of later complication, without mention of misadventure at the time of the procedure: Secondary | ICD-10-CM | POA: Insufficient documentation

## 2012-08-04 MED ORDER — IOHEXOL 300 MG/ML  SOLN
20.0000 mL | Freq: Once | INTRAMUSCULAR | Status: AC | PRN
  Administered 2012-08-04: 20 mL via INTRAVENOUS

## 2012-08-04 NOTE — Procedures (Signed)
Appropriately positioned and functioning right SCV approach port-a-catheter, though not the port is somewhat difficult to access secondary to patient body habitus and slightly oblique lie of the reservoir.  Port a cath is ready for immediate use.

## 2012-08-04 NOTE — Progress Notes (Signed)
CSW receiving phone call from patient stating she would like assistance in going to [facility] facility.  [name] states she feels she needs more assistance and believes she will have difficulties living independently through treatment.  Patient's niece states she has fallen several times at home and that patient is basically wheelchair bound.  The patient has previous stay at [facility] during last chemotherapy treatment.  Patient sent over updated FL2 and medical information to SNF and SNF accepted patient.  CSW reviewed plan with patient's niece [name] to admit patient on Monday, 08/08/12.  CSW will provide patient with medical information packet to bring to SNF on Monday.   Kathrin Penner, MSW, LCSW Clinical Social Worker [facility] [phone]

## 2012-08-09 ENCOUNTER — Non-Acute Institutional Stay (SKILLED_NURSING_FACILITY): Payer: Medicare Other | Admitting: Nurse Practitioner

## 2012-08-09 DIAGNOSIS — I4891 Unspecified atrial fibrillation: Secondary | ICD-10-CM | POA: Insufficient documentation

## 2012-08-09 NOTE — Assessment & Plan Note (Signed)
Subjective:     Indication: atrial fibrillation Bleeding signs/symptoms: None Thromboembolic signs/symptoms: None  Missed Coumadin doses: None Medication changes: no Dietary changes: no Bacterial/viral infection: no Other concerns: no  The following portions of the patient's history were reviewed and updated as appropriate: allergies, current medications, past family history, past medical history, past social history, past surgical history and problem list.  Review of Systems A comprehensive review of systems was negative.   Objective:    INR Today: 3.3 Current dose: Alt. 6mg  TTHSS, 3mg  MWF  Assessment:    Supratherapeutic INR for goal of 2-3   Plan:    1. New dose: Hold coumadin   2. Next INR: Thursday 08/11/12

## 2012-08-09 NOTE — Progress Notes (Signed)
  Subjective:    Patient ID: [name], female    DOB: 1940-10-29, 72 y.o.   MRN: [id number]  HPI    Review of Systems     Objective:   Physical Exam        Assessment & Plan:  A-fib Subjective:     Indication: atrial fibrillation Bleeding signs/symptoms: None Thromboembolic signs/symptoms: None  Missed Coumadin doses: None Medication changes: no Dietary changes: no Bacterial/viral infection: no Other concerns: no  The following portions of the patient's history were reviewed and updated as appropriate: allergies, current medications, past family history, past medical history, past social history, past surgical history and problem list.  Review of Systems A comprehensive review of systems was negative.   Objective:    INR Today: 3.3 Current dose: Alt. 6mg  TTHSS, 3mg  MWF  Assessment:    Supratherapeutic INR for goal of 2-3   Plan:    1. New dose: Hold coumadin   2. Next INR: Thursday 08/11/12

## 2012-08-11 ENCOUNTER — Non-Acute Institutional Stay (SKILLED_NURSING_FACILITY): Payer: Medicare Other | Admitting: Nurse Practitioner

## 2012-08-11 DIAGNOSIS — I4891 Unspecified atrial fibrillation: Secondary | ICD-10-CM

## 2012-08-11 DIAGNOSIS — Z7901 Long term (current) use of anticoagulants: Secondary | ICD-10-CM

## 2012-08-12 ENCOUNTER — Encounter: Payer: Self-pay | Admitting: Oncology

## 2012-08-12 NOTE — Progress Notes (Signed)
Ms [name] the patient's niece left message. Dennie Bible is now with nursing home and having some problems. I advised her to make sure and call Dr. Gaylyn Rong and let him and then he will make accessment. She said the patient lost her medicaid card. I told her to call DSS about getting a new one I advised someone named Abby had left a message for me. I called her back and had to leave her one.

## 2012-08-15 ENCOUNTER — Encounter: Payer: Self-pay | Admitting: Internal Medicine

## 2012-08-15 ENCOUNTER — Non-Acute Institutional Stay (SKILLED_NURSING_FACILITY): Payer: Medicare Other | Admitting: Internal Medicine

## 2012-08-15 DIAGNOSIS — C8589 Other specified types of non-Hodgkin lymphoma, extranodal and solid organ sites: Secondary | ICD-10-CM

## 2012-08-15 DIAGNOSIS — E669 Obesity, unspecified: Secondary | ICD-10-CM

## 2012-08-15 DIAGNOSIS — R5383 Other fatigue: Secondary | ICD-10-CM

## 2012-08-15 DIAGNOSIS — F329 Major depressive disorder, single episode, unspecified: Secondary | ICD-10-CM

## 2012-08-15 DIAGNOSIS — R531 Weakness: Secondary | ICD-10-CM

## 2012-08-15 DIAGNOSIS — E1169 Type 2 diabetes mellitus with other specified complication: Secondary | ICD-10-CM

## 2012-08-15 DIAGNOSIS — I10 Essential (primary) hypertension: Secondary | ICD-10-CM

## 2012-08-15 DIAGNOSIS — E039 Hypothyroidism, unspecified: Secondary | ICD-10-CM

## 2012-08-15 DIAGNOSIS — I4891 Unspecified atrial fibrillation: Secondary | ICD-10-CM

## 2012-08-15 DIAGNOSIS — C8330 Diffuse large B-cell lymphoma, unspecified site: Secondary | ICD-10-CM

## 2012-08-15 DIAGNOSIS — E119 Type 2 diabetes mellitus without complications: Secondary | ICD-10-CM

## 2012-08-15 DIAGNOSIS — E785 Hyperlipidemia, unspecified: Secondary | ICD-10-CM

## 2012-08-15 NOTE — Progress Notes (Signed)
Patient ID: [name], female   DOB: June 06, 1940, 72 y.o.   MRN: [id number]    PCP: Almond Lint, MD  Code Status: full code  Allergies  Allergen Reactions  . Codeine Nausea Only  . 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 Goal is for her to return home after completion of therapy. She was seen in her room today. She has been working with therapy team. She still feels weak and gets tired easily. Denies any pain, nausea, vomiting. Her sleep cycle and appetite is good. Denies any complaint this visit. No concerns from staff  Review of Systems:  Review of Systems  Constitutional: Positive for malaise/fatigue. Negative for fever and chills.  HENT: Negative for congestion.   Eyes: Negative for blurred vision.  Respiratory: Negative for cough and shortness of breath.   Cardiovascular: Negative for chest pain and palpitations.  Gastrointestinal: Negative for heartburn, nausea, vomiting and abdominal pain.  Genitourinary: Negative for dysuria.  Skin: Negative for itching and rash.  Neurological: Positive for weakness. Negative for dizziness and headaches.  Psychiatric/Behavioral: Negative for depression and memory loss. The patient does not have insomnia.     Past Medical History  Diagnosis Date  . Sick sinus syndrome   . Hematoma     At the site of the pacemaker insertion.  . Hypotension   . Dyslipidemia   . Diabetes mellitus   . Pacemaker   . History of atrial fibrillation   . Arthritis     Osteoarthritis  . Hypothyroidism   . Port-a-cath in place 12/08/10  . Status post chemotherapy completed 03/2011    R - CHOP q 3 weeks x 6  . S/P radiation therapy 05/28/2011 - 06/26/2011    Right Abdomen and Right Pelvis/3060 cGy in 17 Fractions  . Diffuse large B cell lymphoma   .  Cancer 10/2010    large B cell lymphoma  . 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 Pacemaker insertion      Lead revision completed August 04, 2006  . Cholecystectomy    . Tubal ligation      Bilateral  . Insert / replace / remove pacemaker    . Abdominal hysterectomy    . Biopsy stomach  11/03/10    Soft Tissue Mass, Biopsy, Right Lower Quadrant Mesenteric Mass- High Grade Non-Hodgkins B Cell Lymphoma  with Flow Cytometry  . Bone biopsy  11/24/10    Bone Marrow, Aspirate Biospy, and clot, Left - No Involvement of Non-Hodgkin's Lymphoma Identified   Social History:   reports that she has been smoking Cigarettes.  She has a 45 pack-year smoking history. She has never used smokeless tobacco. She reports that she does not drink alcohol or use illicit drugs.  Family History  Problem Relation Age of Onset  . Stroke Mother   . Stroke Father   . Aneurysm Son     Brain  . Cancer Sister      1 sister had vaginal cancer  . Cancer Brother     Prostate    Medications: Patient's Medications  New Prescriptions   No medications on file  Previous Medications   ALBUTEROL (PROVENTIL) (2.5 MG/3ML) 0.083% NEBULIZER SOLUTION  Take 2.5 mg by nebulization every 6 (six) hours as needed.   ALLOPURINOL (ZYLOPRIM) 100 MG TABLET    Take 100 mg by mouth daily.   ATORVASTATIN (LIPITOR) 20 MG TABLET    Take 20 mg by mouth daily.   CALCIUM CARBONATE-VITAMIN D (CALCIUM-VITAMIN D) 500-200 MG-UNIT PER TABLET    Take 1 tablet by mouth 2 (two) times daily with a meal.   CITALOPRAM (CELEXA) 20 MG TABLET    Take 20 mg by mouth daily.     DILTIAZEM (CARDIZEM CD) 180 MG 24 HR CAPSULE    Take 180 mg by mouth daily.     LEVOTHYROXINE (SYNTHROID, LEVOTHROID) 100 MCG TABLET    Take 100 mcg by mouth daily.     LIDOCAINE-PRILOCAINE (EMLA) CREAM    Apply topically as needed. Apply one hour before procedure as directed.   METFORMIN  (GLUCOPHAGE) 500 MG TABLET    Take 1,000 mg by mouth. 2 in the morning and 2 tablet at night   ONDANSETRON (ZOFRAN) 8 MG TABLET    Take 1 tablet (8 mg total) by mouth every 12 (twelve) hours as needed for nausea.   PRAVASTATIN (PRAVACHOL) 20 MG TABLET    Take 20 mg by mouth daily.    PROCHLORPERAZINE (COMPAZINE) 10 MG TABLET    Take 1 tablet (10 mg total) by mouth every 6 (six) hours as needed.   WARFARIN (COUMADIN) 6 MG TABLET    Take 6 mg by mouth daily.   Modified Medications   No medications on file  Discontinued Medications   No medications on file     Physical Exam:  Filed Vitals:   08/15/12 1148  BP: 111/85  Pulse: 88  Temp: 97 F (36.1 C)  Resp: 18  SpO2: 95%   Physical Exam  Constitutional: She is oriented to person, place, and time.  Obese pleasant elderly patient in no acute distress, sitting on her recliner  HENT:  Head: Normocephalic and atraumatic.  Mouth/Throat: Oropharynx is clear and moist.  Eyes: Pupils are equal, round, and reactive to light.  Neck: Normal range of motion. Neck supple. No JVD present.  Cardiovascular: Normal rate and regular rhythm.   Pulmonary/Chest: Effort normal and breath sounds normal.  Abdominal: Soft. Bowel sounds are normal. There is no rebound and no guarding.  Musculoskeletal: Normal range of motion. She exhibits no tenderness.  Lymphadenopathy:    She has no cervical adenopathy.  Neurological: She is alert and oriented to person, place, and time.  Skin: Skin is warm and dry.  Psychiatric: She has a normal mood and affect. Her behavior is normal.    Labs reviewed: Basic Metabolic Panel:  Recent Labs  16/10/96 0906 06/29/12 1002 07/27/12 0828  NA 141 142 141  K 4.0 4.2 4.0  CL 101 103 102  CO2 29 27 28   GLUCOSE 160* 130* 138*  BUN 17.0 15.1 15.3  CREATININE 1.2* 1.1 1.0  CALCIUM 10.2 10.4 10.1   Liver Function Tests:  Recent Labs  05/23/12 0906 06/29/12 1002 07/27/12 0828  AST 16 16 16   ALT 25 21 20    ALKPHOS 63 64 67  BILITOT 0.27 0.33 0.34  PROT 6.8 7.3 7.0  ALBUMIN 3.5 3.6 3.4*   CBC:  Recent Labs  05/23/12 0906 06/29/12 1002 07/27/12 0828  WBC 6.9 7.1 7.1  NEUTROABS 4.4 4.7 5.1  HGB 12.8 13.7 13.1  HCT 37.7 40.8 38.7  MCV 87.1 86.7 87.6  PLT 287 311 287    Assessment/Plan  Generalized weakness- recurrence of her lymphoma with undergoing chemotherapy could have contributed to this. Will have her work with PT/OT for strengthening exercises. Fall precautions. Encourage and monitor po intake  Large B cell lymphoma- on bendamustine/ rituxan started from 06/29/12 for recurrence.. Reviewed medication. Follows with dr Gaylyn Rong. Has 3 rd cycle of her chemotherapy pending. Continue allopurinol  Hypothyroidism- continue current regimen of levothyroxine and to follow with pcp  Hypertension- bp stable, continue diltiazem, no changes made  Hyperlipidemia- continue lipitor and follow with pcp  DM type 2- has been getting metformin 1000 mg bid here and on review of home meds is on 2000 mg  Bid, will change this to her home dose.  Afib- rate under control with diltiazem, has a pacemaker and on coumadin for anticoagulation. On 6 mg warfarin for now. Check inr today  Depression- mood stable this visit, continue home regimen citalopram  Family/ staff Communication: reviewed care plan with patient and nursing supervisor   Goals of care: completion of STR and return home   Labs/tests ordered: cbc, bmp routine

## 2012-08-16 ENCOUNTER — Non-Acute Institutional Stay (SKILLED_NURSING_FACILITY): Payer: Medicare Other | Admitting: Nurse Practitioner

## 2012-08-16 DIAGNOSIS — Z7901 Long term (current) use of anticoagulants: Secondary | ICD-10-CM

## 2012-08-16 DIAGNOSIS — I4891 Unspecified atrial fibrillation: Secondary | ICD-10-CM

## 2012-08-16 MED ORDER — WARFARIN SODIUM 1 MG PO TABS: 7.0000 mg | ORAL_TABLET | Freq: Every day | ORAL | Status: DC

## 2012-08-16 NOTE — Progress Notes (Deleted)
  Subjective:    Patient ID: [name], female    DOB: Dec 02, 1940, 72 y.o.   MRN: [id number]  HPI    Review of Systems     Objective:   Physical Exam        Assessment & Plan:

## 2012-08-16 NOTE — Progress Notes (Signed)
Patient ID: [name], female   DOB: Sep 05, 1940, 72 y.o.   MRN: [id number] Subjective:     Indication: atrial fibrillation Bleeding signs/symptoms: None Thromboembolic signs/symptoms: None  Missed Coumadin doses: None Medication changes: no Dietary changes: no Bacterial/viral infection: no Other concerns: no  The following portions of the patient's history were reviewed and updated as appropriate: allergies, current medications, past family history, past medical history, past social history, past surgical history and problem list.  Review of Systems A comprehensive review of systems was negative.   Objective:    INR Today: 2.0 Current dose: 10mg  po x 1 dose yesterday  Assessment:    Therapeutic INR for goal of 2-3   Plan:    1. New dose: increase to 7mg    2. Next INR:  08/19/12

## 2012-08-22 ENCOUNTER — Non-Acute Institutional Stay (SKILLED_NURSING_FACILITY): Payer: Medicare Other | Admitting: Nurse Practitioner

## 2012-08-22 ENCOUNTER — Encounter: Payer: Self-pay | Admitting: Nurse Practitioner

## 2012-08-22 DIAGNOSIS — Z7901 Long term (current) use of anticoagulants: Secondary | ICD-10-CM

## 2012-08-22 DIAGNOSIS — I4891 Unspecified atrial fibrillation: Secondary | ICD-10-CM

## 2012-08-22 NOTE — Progress Notes (Signed)
Patient ID: [name], female   DOB: [birth year], 72 y.o.   MRN: [id number] Subjective:     Indication: atrial fibrillation Bleeding signs/symptoms: None Thromboembolic signs/symptoms: None  Missed Coumadin doses:None   Medication changes: no Dietary changes: no Bacterial/viral infection: no Other concerns: no  The following portions of the patient's history were reviewed and updated as appropriate: allergies, current medications, past family history, past medical history, past social history, past surgical history and problem list.  Review of Systems A comprehensive review of systems was negative.   Objective:    INR Today: 2.6 Current dose: 6mg   Assessment:    Therapeutic INR for goal of 2-3   Plan:    1. New dose: no change   2. Next INR: 1 week

## 2012-08-24 ENCOUNTER — Encounter: Payer: Self-pay | Admitting: Oncology

## 2012-08-24 ENCOUNTER — Other Ambulatory Visit (HOSPITAL_BASED_OUTPATIENT_CLINIC_OR_DEPARTMENT_OTHER): Payer: Medicare Other | Admitting: Lab

## 2012-08-24 ENCOUNTER — Ambulatory Visit (HOSPITAL_BASED_OUTPATIENT_CLINIC_OR_DEPARTMENT_OTHER): Payer: Medicare Other | Admitting: Oncology

## 2012-08-24 ENCOUNTER — Ambulatory Visit (HOSPITAL_BASED_OUTPATIENT_CLINIC_OR_DEPARTMENT_OTHER): Payer: Medicare Other

## 2012-08-24 ENCOUNTER — Telehealth: Payer: Self-pay | Admitting: Oncology

## 2012-08-24 VITALS — BP 96/67 | HR 79 | Temp 97.1°F | Resp 18

## 2012-08-24 VITALS — BP 108/71 | HR 88 | Temp 97.0°F | Resp 18 | Ht <= 58 in | Wt 231.7 lb

## 2012-08-24 DIAGNOSIS — C8589 Other specified types of non-Hodgkin lymphoma, extranodal and solid organ sites: Secondary | ICD-10-CM

## 2012-08-24 DIAGNOSIS — Z5112 Encounter for antineoplastic immunotherapy: Secondary | ICD-10-CM

## 2012-08-24 DIAGNOSIS — I10 Essential (primary) hypertension: Secondary | ICD-10-CM

## 2012-08-24 DIAGNOSIS — C8583 Other specified types of non-Hodgkin lymphoma, intra-abdominal lymph nodes: Secondary | ICD-10-CM

## 2012-08-24 DIAGNOSIS — I4891 Unspecified atrial fibrillation: Secondary | ICD-10-CM

## 2012-08-24 DIAGNOSIS — E119 Type 2 diabetes mellitus without complications: Secondary | ICD-10-CM

## 2012-08-24 DIAGNOSIS — Z5111 Encounter for antineoplastic chemotherapy: Secondary | ICD-10-CM

## 2012-08-24 DIAGNOSIS — C8590 Non-Hodgkin lymphoma, unspecified, unspecified site: Secondary | ICD-10-CM

## 2012-08-24 DIAGNOSIS — C8330 Diffuse large B-cell lymphoma, unspecified site: Secondary | ICD-10-CM

## 2012-08-24 LAB — COMPREHENSIVE METABOLIC PANEL (CC13)
ALT: 46 U/L (ref 0–55)
AST: 35 U/L — ABNORMAL HIGH (ref 5–34)
BUN: 15.4 mg/dL (ref 7.0–26.0)
Creatinine: 1.2 mg/dL — ABNORMAL HIGH (ref 0.6–1.1)
Total Bilirubin: 0.33 mg/dL (ref 0.20–1.20)

## 2012-08-24 LAB — CBC WITH DIFFERENTIAL/PLATELET
BASO%: 1.4 % (ref 0.0–2.0)
EOS%: 7.8 % — ABNORMAL HIGH (ref 0.0–7.0)
HCT: 43.2 % (ref 34.8–46.6)
LYMPH%: 14.1 % (ref 14.0–49.7)
MCH: 29.6 pg (ref 25.1–34.0)
MCHC: 32.6 g/dL (ref 31.5–36.0)
MCV: 90.8 fL (ref 79.5–101.0)
MONO%: 13.7 % (ref 0.0–14.0)
NEUT%: 63 % (ref 38.4–76.8)
Platelets: 273 10*3/uL (ref 145–400)

## 2012-08-24 MED ORDER — HEPARIN SOD (PORK) LOCK FLUSH 100 UNIT/ML IV SOLN
500.0000 [IU] | Freq: Once | INTRAVENOUS | Status: AC | PRN
  Administered 2012-08-24: 500 [IU]
  Filled 2012-08-24: qty 5

## 2012-08-24 MED ORDER — SODIUM CHLORIDE 0.9 % IV SOLN
Freq: Once | INTRAVENOUS | Status: AC
  Administered 2012-08-24: 11:00:00 via INTRAVENOUS

## 2012-08-24 MED ORDER — SODIUM CHLORIDE 0.9 % IV SOLN
67.5000 mg/m2 | Freq: Once | INTRAVENOUS | Status: AC
  Administered 2012-08-24: 140 mg via INTRAVENOUS
  Filled 2012-08-24: qty 28

## 2012-08-24 MED ORDER — ACETAMINOPHEN 325 MG PO TABS
650.0000 mg | ORAL_TABLET | Freq: Once | ORAL | Status: AC
  Administered 2012-08-24: 650 mg via ORAL

## 2012-08-24 MED ORDER — DIPHENHYDRAMINE HCL 25 MG PO CAPS
50.0000 mg | ORAL_CAPSULE | Freq: Once | ORAL | Status: AC
  Administered 2012-08-24: 50 mg via ORAL

## 2012-08-24 MED ORDER — SODIUM CHLORIDE 0.9 % IJ SOLN
10.0000 mL | INTRAMUSCULAR | Status: DC | PRN
  Administered 2012-08-24: 10 mL
  Filled 2012-08-24: qty 10

## 2012-08-24 MED ORDER — SODIUM CHLORIDE 0.9 % IV SOLN
375.0000 mg/m2 | Freq: Once | INTRAVENOUS | Status: AC
  Administered 2012-08-24: 800 mg via INTRAVENOUS
  Filled 2012-08-24: qty 80

## 2012-08-24 MED ORDER — METHYLPREDNISOLONE SODIUM SUCC 125 MG IJ SOLR
125.0000 mg | Freq: Once | INTRAMUSCULAR | Status: AC
  Administered 2012-08-24: 125 mg via INTRAVENOUS

## 2012-08-24 MED ORDER — ONDANSETRON 8 MG/50ML IVPB (CHCC)
8.0000 mg | Freq: Once | INTRAVENOUS | Status: AC
  Administered 2012-08-24: 8 mg via INTRAVENOUS

## 2012-08-24 MED ORDER — DEXAMETHASONE SODIUM PHOSPHATE 10 MG/ML IJ SOLN
10.0000 mg | Freq: Once | INTRAMUSCULAR | Status: AC
  Administered 2012-08-24: 10 mg via INTRAVENOUS

## 2012-08-24 NOTE — Progress Notes (Signed)
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   PASTTHERAPY: q3wk R-CHOP x6 cycles; finished in Dec 2012; followed by consolidative radiation therapy.    CURRENT THERAPY: recurrent disease; started on bendamustine/Rituxan on 06/29/2012.   INTERVAL HISTORY: [name] 72 y.o. female returns for regular follow up with her 2 nieces. She reports doing relatively well on chemotherapy. She had mild fatigue. She is now residing at a skilled nursing facility for short-term rehabilitation. She denies fever, mucositis, abdominal pain, leading, skin rash, dysuria, pyuria. She experienced nausea of vomiting this past weekend, but that has now resolved. Appetite is still good. The rest of the 14 point review of system was negative.    Past Medical History  Diagnosis Date  . Sick sinus syndrome   . Hematoma     At the site of the pacemaker insertion.  . Hypotension   . Dyslipidemia   . Diabetes mellitus   . Pacemaker   . History of atrial fibrillation   . Arthritis     Osteoarthritis  . Hypothyroidism   . Port-a-cath in place 12/08/10  . Status post chemotherapy completed 03/2011    R - CHOP q 3 weeks x 6  . S/P radiation therapy 05/28/2011 - 06/26/2011    Right Abdomen and Right Pelvis/3060 cGy in 17 Fractions  . Diffuse large B cell lymphoma   . Cancer 10/2010    large B cell lymphoma  . 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 Pacemaker insertion      Lead revision completed August 04, 2006  . Cholecystectomy    . Tubal ligation      Bilateral  . Insert / replace / remove pacemaker    . Abdominal hysterectomy    . Biopsy stomach  11/03/10    Soft Tissue Mass, Biopsy, Right Lower Quadrant Mesenteric Mass- High Grade Non-Hodgkins B Cell Lymphoma  with Flow Cytometry  . Bone  biopsy  11/24/10    Bone Marrow, Aspirate Biospy, and clot, Left - No Involvement of Non-Hodgkin's Lymphoma Identified    Current Outpatient Prescriptions  Medication Sig Dispense Refill  . albuterol (PROVENTIL) (2.5 MG/3ML) 0.083% nebulizer solution Take 2.5 mg by nebulization every 6 (six) hours as needed.      Marland Kitchen allopurinol (ZYLOPRIM) 100 MG tablet Take 100 mg by mouth daily.      Marland Kitchen atorvastatin (LIPITOR) 20 MG tablet Take 20 mg by mouth daily.      . Calcium Carbonate-Vitamin D (CALCIUM-VITAMIN D) 500-200 MG-UNIT per tablet Take 1 tablet by mouth 2 (two) times daily with a meal.      . citalopram (CELEXA) 20 MG tablet Take 20 mg by mouth daily.        Marland Kitchen diltiazem (CARDIZEM CD) 180 MG 24 hr capsule Take 180 mg by mouth daily.        Marland Kitchen levothyroxine (SYNTHROID, LEVOTHROID) 100 MCG tablet Take 100 mcg by mouth daily.        Marland Kitchen lidocaine-prilocaine (EMLA) cream Apply topically as needed. Apply one hour before procedure as directed.  30 g  0  . metFORMIN (GLUCOPHAGE) 500 MG tablet Take 1,000 mg by mouth. 2 in the morning and 2 tablet at night      . ondansetron (ZOFRAN) 8 MG tablet Take 1 tablet (8 mg total) by  mouth every 12 (twelve) hours as needed for nausea.  20 tablet  3  . prochlorperazine (COMPAZINE) 10 MG tablet Take 1 tablet (10 mg total) by mouth every 6 (six) hours as needed.  30 tablet  3  . warfarin (COUMADIN) 1 MG tablet Take 7 tablets (7 mg total) by mouth daily.  30 tablet  0   No current facility-administered medications for this visit.    ALLERGIES:  is allergic to codeine.  REVIEW OF SYSTEMS:  The rest of the 14-point review of system was negative.   Filed Vitals:   08/24/12 0947  BP: 108/71  Pulse: 88  Temp: 97 F (36.1 C)  Resp: 18   Wt Readings from Last 3 Encounters:  08/24/12 231 lb 11.2 oz (105.098 kg)  07/27/12 239 lb 14.4 oz (108.818 kg)  06/29/12 237 lb 14.4 oz (107.911 kg)   ECOG Performance status: 2 due to obesity, osteoarthritis.   PHYSICAL  EXAMINATION:  General:  Obese woman, in wheelchair, in no acute distress.  Eyes:  no scleral icterus.  ENT:  There were no oropharyngeal lesions.  Neck was without thyromegaly.  Lymphatics:  Negative cervical, supraclavicular or axillary adenopathy.  Respiratory: lungs were clear bilaterally without wheezing or crackles.  Cardiovascular:  Regular rate and rhythm, S1/S2, without murmur, rub or gallop.  There was no pedal edema.  GI:  abdomen was soft, obese, nontender, nondistended, without organomegaly.  Muscoloskeletal:  no spinal tenderness of palpation of vertebral spine.  Skin exam was without echymosis, petichae.    LABORATORY/RADIOLOGY DATA:  Lab Results  Component Value Date   WBC 5.6 08/24/2012   HGB 14.1 08/24/2012   HCT 43.2 08/24/2012   PLT 273 08/24/2012   GLUCOSE 138* 07/27/2012   ALKPHOS 67 07/27/2012   ALT 20 07/27/2012   AST 16 07/27/2012   NA 141 07/27/2012   K 4.0 07/27/2012   CL 102 07/27/2012   CREATININE 1.0 07/27/2012   BUN 15.3 07/27/2012   CO2 28 07/27/2012   INR 1.62* 04/07/2011    ASSESSMENT AND PLAN:    1. Recurrent high grade NHL: -Status post 2 cycles of bendamustine Rituxan with good toleration without any dose-limiting toxicity. She had grade 1 fatigue. I recommended to proceed with the third cycle of chemotherapy without dose modification. I have ordered a restaging CT of the abdomen prior to her next chemotherapy.  2. Hypothyroidism: She has levothyroxine per PCP. 3. Hypertension: She is on diltiazem PCP.  4. Diabetes mellitus, type II: She is on metformin per PCP.  5. Hyperlipidemia: She is on atorvastatin per PCP.  6. History of atrial fibrillation: She is on diltiazem, and she has a pacemaker. She is on Coumadin being managed per her PCP. 7. Follow in about 4 weeks prior to the third cycle of chemotherapy.   The length of time of the face-to-face encounter was 30 minutes. More than 50% of time was spent counseling and coordination of care.

## 2012-08-24 NOTE — Patient Instructions (Signed)
[facility] Discharge Instructions for Patients Receiving Chemotherapy  Today you received the following chemotherapy agents Rituxan and Treanda  To help prevent nausea and vomiting after your treatment, we encourage you to take your nausea medication. Begin taking your nausea medication as often as prescribed for by Dr. Gaylyn Rong.  If you develop nausea and vomiting that is not controlled by your nausea medication, call the clinic. If it is after clinic hours your family physician or the after hours number for the clinic or go to the Emergency Department.   BELOW ARE SYMPTOMS THAT SHOULD BE REPORTED IMMEDIATELY:  *FEVER GREATER THAN 100.5 F  *CHILLS WITH OR WITHOUT FEVER  NAUSEA AND VOMITING THAT IS NOT CONTROLLED WITH YOUR NAUSEA MEDICATION  *UNUSUAL SHORTNESS OF BREATH  *UNUSUAL BRUISING OR BLEEDING  TENDERNESS IN MOUTH AND THROAT WITH OR WITHOUT PRESENCE OF ULCERS  *URINARY PROBLEMS  *BOWEL PROBLEMS  UNUSUAL RASH Items with * indicate a potential emergency and should be followed up as soon as possible.  One of the nurses will contact you 24 hours after your treatment. Please let the nurse know about any problems that you may have experienced. Feel free to call the clinic you have any questions or concerns. The clinic phone number is [phone].   I have been informed and understand all the instructions given to me. I know to contact the clinic, my physician, or go to the Emergency Department if any problems should occur. I do not have any questions at this time, but understand that I may call the clinic during office hours   should I have any questions or need assistance in obtaining follow up care.    __________________________________________  _____________  __________ Signature of Patient or Authorized Representative            Date                   Time    __________________________________________ Nurse's Signature

## 2012-08-25 ENCOUNTER — Ambulatory Visit (HOSPITAL_BASED_OUTPATIENT_CLINIC_OR_DEPARTMENT_OTHER): Payer: Medicare Other

## 2012-08-25 VITALS — BP 125/79 | HR 91 | Temp 97.8°F | Resp 18

## 2012-08-25 DIAGNOSIS — Z5111 Encounter for antineoplastic chemotherapy: Secondary | ICD-10-CM

## 2012-08-25 DIAGNOSIS — C8589 Other specified types of non-Hodgkin lymphoma, extranodal and solid organ sites: Secondary | ICD-10-CM

## 2012-08-25 DIAGNOSIS — C8330 Diffuse large B-cell lymphoma, unspecified site: Secondary | ICD-10-CM

## 2012-08-25 MED ORDER — SODIUM CHLORIDE 0.9 % IV SOLN
67.5000 mg/m2 | Freq: Once | INTRAVENOUS | Status: AC
  Administered 2012-08-25: 140 mg via INTRAVENOUS
  Filled 2012-08-25: qty 28

## 2012-08-25 MED ORDER — SODIUM CHLORIDE 0.9 % IV SOLN
Freq: Once | INTRAVENOUS | Status: AC
  Administered 2012-08-25: 14:00:00 via INTRAVENOUS

## 2012-08-25 MED ORDER — SODIUM CHLORIDE 0.9 % IJ SOLN
10.0000 mL | INTRAMUSCULAR | Status: DC | PRN
  Administered 2012-08-25: 10 mL
  Filled 2012-08-25: qty 10

## 2012-08-25 MED ORDER — DEXAMETHASONE SODIUM PHOSPHATE 10 MG/ML IJ SOLN
10.0000 mg | Freq: Once | INTRAMUSCULAR | Status: AC
  Administered 2012-08-25: 10 mg via INTRAVENOUS

## 2012-08-25 MED ORDER — ONDANSETRON 8 MG/50ML IVPB (CHCC)
8.0000 mg | Freq: Once | INTRAVENOUS | Status: AC
  Administered 2012-08-25: 8 mg via INTRAVENOUS

## 2012-08-25 MED ORDER — HEPARIN SOD (PORK) LOCK FLUSH 100 UNIT/ML IV SOLN
500.0000 [IU] | Freq: Once | INTRAVENOUS | Status: AC | PRN
  Administered 2012-08-25: 500 [IU]
  Filled 2012-08-25: qty 5

## 2012-08-25 NOTE — Patient Instructions (Addendum)
[facility] Discharge Instructions for Patients Receiving Chemotherapy  Today you received the following chemotherapy agents Treanda.  To help prevent nausea and vomiting after your treatment, we encourage you to take your nausea medication as prescribed.   If you develop nausea and vomiting that is not controlled by your nausea medication, call the clinic. If it is after clinic hours your family physician or the after hours number for the clinic or go to the Emergency Department.   BELOW ARE SYMPTOMS THAT SHOULD BE REPORTED IMMEDIATELY:  *FEVER GREATER THAN 100.5 F  *CHILLS WITH OR WITHOUT FEVER  NAUSEA AND VOMITING THAT IS NOT CONTROLLED WITH YOUR NAUSEA MEDICATION  *UNUSUAL SHORTNESS OF BREATH  *UNUSUAL BRUISING OR BLEEDING  TENDERNESS IN MOUTH AND THROAT WITH OR WITHOUT PRESENCE OF ULCERS  *URINARY PROBLEMS  *BOWEL PROBLEMS  UNUSUAL RASH Items with * indicate a potential emergency and should be followed up as soon as possible.  Feel free to call the clinic you have any questions or concerns. The clinic phone number is [phone].   I have been informed and understand all the instructions given to me. I know to contact the clinic, my physician, or go to the Emergency Department if any problems should occur. I do not have any questions at this time, but understand that I may call the clinic during office hours   should I have any questions or need assistance in obtaining follow up care.    __________________________________________  _____________  __________ Signature of Patient or Authorized Representative            Date                   Time    __________________________________________ Nurse's Signature    

## 2012-08-26 ENCOUNTER — Ambulatory Visit (HOSPITAL_BASED_OUTPATIENT_CLINIC_OR_DEPARTMENT_OTHER): Payer: Medicare Other

## 2012-08-26 VITALS — BP 119/58 | HR 73 | Temp 98.3°F

## 2012-08-26 DIAGNOSIS — C8589 Other specified types of non-Hodgkin lymphoma, extranodal and solid organ sites: Secondary | ICD-10-CM

## 2012-08-26 DIAGNOSIS — C8330 Diffuse large B-cell lymphoma, unspecified site: Secondary | ICD-10-CM

## 2012-08-26 MED ORDER — PEGFILGRASTIM INJECTION 6 MG/0.6ML
6.0000 mg | Freq: Once | SUBCUTANEOUS | Status: AC
  Administered 2012-08-26: 6 mg via SUBCUTANEOUS
  Filled 2012-08-26: qty 0.6

## 2012-08-29 ENCOUNTER — Encounter: Payer: Self-pay | Admitting: Nurse Practitioner

## 2012-09-02 ENCOUNTER — Other Ambulatory Visit: Payer: Medicare Other | Admitting: Lab

## 2012-09-02 ENCOUNTER — Ambulatory Visit: Payer: Medicare Other | Admitting: Oncology

## 2012-09-06 ENCOUNTER — Non-Acute Institutional Stay (SKILLED_NURSING_FACILITY): Payer: Medicare Other | Admitting: Nurse Practitioner

## 2012-09-06 DIAGNOSIS — Z7901 Long term (current) use of anticoagulants: Secondary | ICD-10-CM

## 2012-09-06 DIAGNOSIS — I4891 Unspecified atrial fibrillation: Secondary | ICD-10-CM

## 2012-09-08 ENCOUNTER — Non-Acute Institutional Stay (SKILLED_NURSING_FACILITY): Payer: Medicare Other | Admitting: Nurse Practitioner

## 2012-09-08 DIAGNOSIS — R269 Unspecified abnormalities of gait and mobility: Secondary | ICD-10-CM

## 2012-09-08 DIAGNOSIS — R531 Weakness: Secondary | ICD-10-CM

## 2012-09-08 DIAGNOSIS — R5381 Other malaise: Secondary | ICD-10-CM

## 2012-09-08 DIAGNOSIS — C189 Malignant neoplasm of colon, unspecified: Secondary | ICD-10-CM

## 2012-09-08 DIAGNOSIS — D849 Immunodeficiency, unspecified: Secondary | ICD-10-CM

## 2012-09-08 DIAGNOSIS — C772 Secondary and unspecified malignant neoplasm of intra-abdominal lymph nodes: Secondary | ICD-10-CM

## 2012-09-09 ENCOUNTER — Encounter: Payer: Self-pay | Admitting: Nurse Practitioner

## 2012-09-12 ENCOUNTER — Encounter: Payer: Self-pay | Admitting: Nurse Practitioner

## 2012-09-12 NOTE — Progress Notes (Signed)
Patient ID: [name], female   DOB: November 15, 1940, 72 y.o.   MRN: [id number] Subjective:     Indication: atrial fibrillation Bleeding signs/symptoms: None Thromboembolic signs/symptoms: None  Missed Coumadin doses: None Medication changes: no Dietary changes: no Bacterial/viral infection: no Other concerns: no  The following portions of the patient's history were reviewed and updated as appropriate: allergies, current medications, past family history, past medical history, past social history, past surgical history and problem list.  Review of Systems A comprehensive review of systems was negative.   Objective:    INR Today: 2.8 Current dose: 5mg   Assessment:    Therapeutic INR for goal of 2-3   Plan:    1. New dose: no change   2. Next INR: 1 week

## 2012-09-12 NOTE — Progress Notes (Signed)
Patient ID: [name], female   DOB: Dec 24, 1940, 72 y.o.   MRN: [id number] Subjective:     Indication: atrial fibrillation Bleeding signs/symptoms: None Thromboembolic signs/symptoms: None  Missed Coumadin doses: None Medication changes: no Dietary changes: no Bacterial/viral infection: no Other concerns: no  The following portions of the patient's history were reviewed and updated as appropriate: allergies, current medications, past family history, past medical history, past social history, past surgical history and problem list.  Review of Systems A comprehensive review of systems was negative.   Objective:    INR Today: 1.5 Current dose: held Assessment:    Subtherapeutic INR for goal of 2-3   Plan:    1. New dose: d/c previous alternating dose. Start 6mg  po qd. 2. Next INR: 08/15/12

## 2012-09-13 NOTE — Progress Notes (Signed)
  Subjective:    Patient ID: [name], female    DOB: 1940/11/14, 72 y.o.   MRN: [id number]  HPI Comments: Pt was seen today in room for discharge from [facility]. Two daughters are at the bedside. She has Stage  4 mesenteric cancer and here to work with therapy for weakness. She has hx of afib and is taking coumadin. She is followed by [facility]. She has no complaints today and has been stable.     Review of Systems  All other systems reviewed and are negative.  The following portions of the patient's history were reviewed and updated as appropriate: allergies, current medications, past family history, past medical history, past social history, past surgical history and problem list. Labs and Radiographs.     Objective:   Physical Exam  Constitutional: She is oriented to person, place, and time. She appears well-developed and well-nourished. No distress.  Eyes: Pupils are equal, round, and reactive to light.  Cardiovascular: Normal rate.  An irregularly irregular rhythm present.  Pulmonary/Chest: Effort normal and breath sounds normal.  Musculoskeletal: Normal range of motion.  Neurological: She is alert and oriented to person, place, and time.  Skin: Skin is warm and dry. She is not diaphoretic.  Psychiatric: She has a normal mood and affect.      Assessment & Plan:  1) d/c home today with home health pt/ot/rn/aide.  2) Inr recheck on 09/12/12 by home health RN. Send results to cardiology or cancer center. 3) pt declined refill of current meds. 4) pt is to follow up with pcp in 1-2 weeks, cancer center as directed.

## 2012-09-13 NOTE — Progress Notes (Signed)
This encounter was created in error - please disregard.

## 2012-09-19 ENCOUNTER — Encounter (HOSPITAL_COMMUNITY): Payer: Self-pay

## 2012-09-19 ENCOUNTER — Ambulatory Visit (HOSPITAL_COMMUNITY)
Admission: RE | Admit: 2012-09-19 | Discharge: 2012-09-19 | Disposition: A | Discharge status: Home / Self Care | Payer: Medicare Other | Source: Ambulatory Visit | Attending: Oncology | Admitting: Oncology

## 2012-09-19 DIAGNOSIS — Z950 Presence of cardiac pacemaker: Secondary | ICD-10-CM | POA: Insufficient documentation

## 2012-09-19 DIAGNOSIS — C8590 Non-Hodgkin lymphoma, unspecified, unspecified site: Secondary | ICD-10-CM

## 2012-09-19 DIAGNOSIS — C8589 Other specified types of non-Hodgkin lymphoma, extranodal and solid organ sites: Secondary | ICD-10-CM | POA: Insufficient documentation

## 2012-09-19 DIAGNOSIS — R599 Enlarged lymph nodes, unspecified: Secondary | ICD-10-CM | POA: Insufficient documentation

## 2012-09-19 DIAGNOSIS — I517 Cardiomegaly: Secondary | ICD-10-CM | POA: Insufficient documentation

## 2012-09-19 DIAGNOSIS — K7689 Other specified diseases of liver: Secondary | ICD-10-CM | POA: Insufficient documentation

## 2012-09-19 MED ORDER — IOHEXOL 300 MG/ML  SOLN
50.0000 mL | Freq: Once | INTRAMUSCULAR | Status: AC | PRN
  Administered 2012-09-19: 50 mL via ORAL

## 2012-09-19 MED ORDER — IOHEXOL 300 MG/ML  SOLN
125.0000 mL | Freq: Once | INTRAMUSCULAR | Status: AC | PRN
  Administered 2012-09-19: 125 mL via INTRAVENOUS

## 2012-09-20 NOTE — Patient Instructions (Addendum)
1.  Diagnosis:  History of high grade lymphoma.  2.  Status:  After 3 cycle of chemo Bendamustine/Rituxan for presumed recurrent disease, the mass got bigger.  3.  Recommendations:  *  Referral to General Surgery for evaluation to see if repeat biopsy is feasible to establish definitive diagnosis.  *  If not able to do so, I recommend referral to [facility]irstie Mirza, or [place]  for 2nd opinion.

## 2012-09-21 ENCOUNTER — Ambulatory Visit (HOSPITAL_BASED_OUTPATIENT_CLINIC_OR_DEPARTMENT_OTHER): Payer: Medicare Other | Admitting: Oncology

## 2012-09-21 ENCOUNTER — Telehealth: Payer: Self-pay | Admitting: Oncology

## 2012-09-21 ENCOUNTER — Other Ambulatory Visit (HOSPITAL_BASED_OUTPATIENT_CLINIC_OR_DEPARTMENT_OTHER): Payer: Medicare Other | Admitting: Lab

## 2012-09-21 ENCOUNTER — Ambulatory Visit: Payer: Medicare Other

## 2012-09-21 VITALS — BP 148/77 | HR 73 | Temp 97.6°F | Resp 18 | Ht <= 58 in | Wt 235.5 lb

## 2012-09-21 DIAGNOSIS — I4891 Unspecified atrial fibrillation: Secondary | ICD-10-CM

## 2012-09-21 DIAGNOSIS — I10 Essential (primary) hypertension: Secondary | ICD-10-CM

## 2012-09-21 DIAGNOSIS — C8590 Non-Hodgkin lymphoma, unspecified, unspecified site: Secondary | ICD-10-CM

## 2012-09-21 DIAGNOSIS — R5383 Other fatigue: Secondary | ICD-10-CM

## 2012-09-21 DIAGNOSIS — C8583 Other specified types of non-Hodgkin lymphoma, intra-abdominal lymph nodes: Secondary | ICD-10-CM

## 2012-09-21 DIAGNOSIS — E119 Type 2 diabetes mellitus without complications: Secondary | ICD-10-CM

## 2012-09-21 DIAGNOSIS — R5381 Other malaise: Secondary | ICD-10-CM

## 2012-09-21 DIAGNOSIS — C8589 Other specified types of non-Hodgkin lymphoma, extranodal and solid organ sites: Secondary | ICD-10-CM

## 2012-09-21 LAB — CBC WITH DIFFERENTIAL/PLATELET
Basophils Absolute: 0.1 10*3/uL (ref 0.0–0.1)
Eosinophils Absolute: 0.2 10*3/uL (ref 0.0–0.5)
HCT: 36.5 % (ref 34.8–46.6)
HGB: 12.6 g/dL (ref 11.6–15.9)
LYMPH%: 40.6 % (ref 14.0–49.7)
MONO#: 1 10*3/uL — ABNORMAL HIGH (ref 0.1–0.9)
NEUT#: 2.6 10*3/uL (ref 1.5–6.5)
NEUT%: 38.9 % (ref 38.4–76.8)
Platelets: 248 10*3/uL (ref 145–400)
WBC: 6.7 10*3/uL (ref 3.9–10.3)

## 2012-09-21 LAB — COMPREHENSIVE METABOLIC PANEL (CC13)
CO2: 27 mEq/L (ref 22–29)
Creatinine: 1 mg/dL (ref 0.6–1.1)
Glucose: 161 mg/dl — ABNORMAL HIGH (ref 70–99)
Sodium: 139 mEq/L (ref 136–145)
Total Bilirubin: 0.39 mg/dL (ref 0.20–1.20)
Total Protein: 6.7 g/dL (ref 6.4–8.3)

## 2012-09-21 NOTE — Progress Notes (Signed)
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   PASTTHERAPY: q3wk R-CHOP x6 cycles; finished in Dec 2012; followed by consolidative radiation therapy.    CURRENT THERAPY: recurrent disease; started on bendamustine/Rituxan on 06/29/2012.   INTERVAL HISTORY: [name] 72 y.o. female returns for regular follow up with her 2 nieces. She had worsening fatigue with more chemo.  She had nausea/vomiting for a few days after last chemo.  She was in a SNF for a few weeks.  She is now again home by herself.  She thinks that her stamina is almost back to baseline.   Patient denies fever, anorexia, weight loss, headache, visual changes, confusion, drenching night sweats, palpable lymph node swelling, mucositis, odynophagia, dysphagia, nausea vomiting, jaundice, chest pain, palpitation, shortness of breath, dyspnea on exertion, productive cough, gum bleeding, epistaxis, hematemesis, hemoptysis, abdominal pain, abdominal swelling, early satiety, melena, hematochezia, hematuria, skin rash, spontaneous bleeding, joint swelling, joint pain, heat or cold intolerance, bowel bladder incontinence, back pain, focal motor weakness, paresthesia, depression.      Past Medical History  Diagnosis Date  . Sick sinus syndrome   . Hematoma     At the site of the pacemaker insertion.  . Hypotension   . Dyslipidemia   . Diabetes mellitus   . Pacemaker   . History of atrial fibrillation   . Arthritis     Osteoarthritis  . Hypothyroidism   . Port-a-cath in place 12/08/10  . Status post chemotherapy completed 03/2011    R - CHOP q 3 weeks x 6  . S/P radiation therapy 05/28/2011 - 06/26/2011    Right Abdomen and Right Pelvis/3060 cGy in 17 Fractions  . Diffuse large B cell lymphoma   . Cancer 10/2010    large B cell lymphoma  . 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 Non-Hodgkin's lymphoma of abdomen  05/20/2011    Past Surgical History  Procedure Laterality Date  . Pacemaker insertion      Lead revision completed August 04, 2006  . Cholecystectomy    . Tubal ligation      Bilateral  . Insert / replace / remove pacemaker    . Abdominal hysterectomy    . Biopsy stomach  11/03/10    Soft Tissue Mass, Biopsy, Right Lower Quadrant Mesenteric Mass- High Grade Non-Hodgkins B Cell Lymphoma  with Flow Cytometry  . Bone biopsy  11/24/10    Bone Marrow, Aspirate Biospy, and clot, Left - No Involvement of Non-Hodgkin's Lymphoma Identified    Current Outpatient Prescriptions  Medication Sig Dispense Refill  . albuterol (PROVENTIL) (2.5 MG/3ML) 0.083% nebulizer solution Take 2.5 mg by nebulization every 6 (six) hours as needed.      Marland Kitchen allopurinol (ZYLOPRIM) 300 MG tablet Take 300 mg by mouth daily.      . Cholecalciferol (VITAMIN D3) 2000 UNITS TABS Take by mouth daily.      . citalopram (CELEXA) 20 MG tablet Take 20 mg by mouth daily.        Marland Kitchen diltiazem (CARDIZEM CD) 180 MG 24 hr capsule Take 180 mg by mouth daily.        Marland Kitchen levothyroxine (SYNTHROID, LEVOTHROID) 100 MCG tablet Take 100 mcg by mouth daily.        Marland Kitchen lidocaine-prilocaine (EMLA) cream Apply topically as needed. Apply one hour before procedure as directed.  30 g  0  . loperamide (IMODIUM) 2 MG capsule Take  2 mg by mouth 4 (four) times daily as needed for diarrhea or loose stools.      . metFORMIN (GLUCOPHAGE) 500 MG tablet Take 1,000 mg by mouth. 2 in the morning and 2 tablet at night      . ondansetron (ZOFRAN) 8 MG tablet Take 1 tablet (8 mg total) by mouth every 12 (twelve) hours as needed for nausea.  20 tablet  3  . pravastatin (PRAVACHOL) 40 MG tablet Take 40 mg by mouth daily.      . prochlorperazine (COMPAZINE) 10 MG tablet Take 1 tablet (10 mg total) by mouth every 6 (six) hours as needed.  30 tablet  3  . warfarin (COUMADIN) 6 MG tablet Take 6 mg by mouth daily. Take as directed.       No current facility-administered  medications for this visit.    ALLERGIES:  is allergic to codeine.  REVIEW OF SYSTEMS:  The rest of the 14-point review of system was negative.   Filed Vitals:   09/21/12 0936  BP: 148/77  Pulse: 73  Temp: 97.6 F (36.4 C)  Resp: 18   Wt Readings from Last 3 Encounters:  09/21/12 235 lb 8 oz (106.822 kg)  08/24/12 231 lb 11.2 oz (105.098 kg)  07/27/12 239 lb 14.4 oz (108.818 kg)   ECOG Performance status: 2 due to obesity, osteoarthritis.   PHYSICAL EXAMINATION:  General:  Obese woman, in wheelchair, in no acute distress.  Eyes:  no scleral icterus.  ENT:  There were no oropharyngeal lesions.  Neck was without thyromegaly.  Lymphatics:  Negative cervical, supraclavicular or axillary adenopathy.  Respiratory: lungs were clear bilaterally without wheezing or crackles.  Cardiovascular:  Regular rate and rhythm, S1/S2, without murmur, rub or gallop.  There was no pedal edema.  GI:  abdomen was soft, obese, nontender, nondistended, without organomegaly.  Muscoloskeletal:  no spinal tenderness of palpation of vertebral spine.  Skin exam was without echymosis, petichae.       LABORATORY/RADIOLOGY DATA:  Lab Results  Component Value Date   WBC 6.7 09/21/2012   HGB 12.6 09/21/2012   HCT 36.5 09/21/2012   PLT 248 09/21/2012   GLUCOSE 161* 09/21/2012   ALKPHOS 65 09/21/2012   ALT 23 09/21/2012   AST 26 09/21/2012   NA 139 09/21/2012   K 4.1 09/21/2012   CL 102 09/21/2012   CREATININE 1.0 09/21/2012   BUN 11.9 09/21/2012   CO2 27 09/21/2012   INR 1.62* 04/07/2011    ASSESSMENT AND PLAN:    1. Recurrent high grade NHL:  - Status:  After 3 cycle of chemo Bendamustine/Rituxan for presumed recurrent disease, the mass got bigger.  - Recommendations:  *  Referral to General Surgery for evaluation to see if repeat biopsy is feasible to establish definitive diagnosis.  *  If not able to do so, I recommend referral to [facility] Mirza, or [facility]  for 2nd opinion.   [name] and her nieces  expressed informed understanding and wished to get repeat biopsy.   2. Hypothyroidism: She has levothyroxine per PCP. 3. Hypertension: She is on diltiazem PCP.  4. Diabetes mellitus, type II: She is on metformin per PCP.  5. Hyperlipidemia: She is on pravastatin per PCP.  6. History of atrial fibrillation: She is on diltiazem, and she has a pacemaker. She is on Coumadin being managed per her PCP.  Her coumadin will need to be on hold if she undergoes biopsy. 7. Follow in about 3-4 weeks to discuss  treatment options.    The length of time of the face-to-face encounter was 25 minutes. More than 50% of time was spent counseling and coordination of care.        Cordarro Spinnato T. Gaylyn Rong, M.D.

## 2012-09-21 NOTE — Telephone Encounter (Signed)
gv and printed appt sched and avs for pt  °

## 2012-09-22 ENCOUNTER — Ambulatory Visit: Payer: Medicare Other

## 2012-09-23 ENCOUNTER — Other Ambulatory Visit: Payer: Self-pay | Admitting: Oncology

## 2012-09-23 ENCOUNTER — Ambulatory Visit: Payer: Medicare Other

## 2012-09-23 DIAGNOSIS — C8590 Non-Hodgkin lymphoma, unspecified, unspecified site: Secondary | ICD-10-CM

## 2012-09-26 ENCOUNTER — Telehealth: Payer: Self-pay | Admitting: *Deleted

## 2012-09-26 ENCOUNTER — Encounter: Payer: Self-pay | Admitting: Oncology

## 2012-09-26 ENCOUNTER — Other Ambulatory Visit: Payer: Self-pay | Admitting: Radiology

## 2012-09-26 NOTE — Progress Notes (Signed)
Ms [name] had left me a message about patient's discount. I called her back and left her a message on how it works and that they must call when they receive the bill to advise them to take discount at that time and that it covers [facility] only,.

## 2012-09-26 NOTE — Telephone Encounter (Signed)
Pt scheduled for Lymph node biopsy on 6/23.  She is on Coumadin per Dr. Oneta Rack.  Spoke w/ Dr. Kathryne Sharper nurse, Aram Beecham.  Asked if ok w/ Dr. Oneta Rack for pt to hold coumadin starting today until after the procedure?   Waiting for return call.

## 2012-09-26 NOTE — Telephone Encounter (Signed)
Call back from [facility] w/ Dr. Oneta Rack.  She states Ok w/ Dr. Oneta Rack for pt to hold her coumadin x 1 week, starting today.   Called pt to instruct her to hold coumadin starting today until after her biopsy on 10/03/12.  Instructed her to contact Dr. Oneta Rack office for further orders on what dose to resume and when to get next INR checked. She verbalized understanding.

## 2012-09-28 ENCOUNTER — Ambulatory Visit (HOSPITAL_COMMUNITY): Payer: Medicare Other

## 2012-09-28 ENCOUNTER — Other Ambulatory Visit (HOSPITAL_COMMUNITY): Payer: Medicare Other

## 2012-09-29 ENCOUNTER — Other Ambulatory Visit: Payer: Self-pay | Admitting: Radiology

## 2012-09-29 ENCOUNTER — Ambulatory Visit (HOSPITAL_COMMUNITY): Payer: Medicare Other

## 2012-09-29 ENCOUNTER — Encounter (HOSPITAL_COMMUNITY): Payer: Self-pay | Admitting: Pharmacy Technician

## 2012-09-30 ENCOUNTER — Other Ambulatory Visit: Payer: Self-pay | Admitting: Oncology

## 2012-09-30 DIAGNOSIS — C8590 Non-Hodgkin lymphoma, unspecified, unspecified site: Secondary | ICD-10-CM

## 2012-10-03 ENCOUNTER — Telehealth: Payer: Self-pay | Admitting: *Deleted

## 2012-10-03 ENCOUNTER — Ambulatory Visit (HOSPITAL_COMMUNITY): Admission: RE | Admit: 2012-10-03 | Payer: Medicare Other | Source: Ambulatory Visit

## 2012-10-03 ENCOUNTER — Ambulatory Visit (HOSPITAL_COMMUNITY): Payer: Medicare Other

## 2012-10-03 ENCOUNTER — Ambulatory Visit (HOSPITAL_COMMUNITY)
Admission: RE | Admit: 2012-10-03 | Discharge: 2012-10-03 | Disposition: A | Discharge status: Home / Self Care | Payer: Medicare Other | Source: Ambulatory Visit | Attending: Oncology | Admitting: Oncology

## 2012-10-03 ENCOUNTER — Other Ambulatory Visit (HOSPITAL_COMMUNITY): Payer: Medicare Other

## 2012-10-03 ENCOUNTER — Encounter (HOSPITAL_COMMUNITY): Payer: Self-pay

## 2012-10-03 DIAGNOSIS — Z9221 Personal history of antineoplastic chemotherapy: Secondary | ICD-10-CM | POA: Insufficient documentation

## 2012-10-03 DIAGNOSIS — E039 Hypothyroidism, unspecified: Secondary | ICD-10-CM | POA: Insufficient documentation

## 2012-10-03 DIAGNOSIS — Z950 Presence of cardiac pacemaker: Secondary | ICD-10-CM | POA: Insufficient documentation

## 2012-10-03 DIAGNOSIS — E119 Type 2 diabetes mellitus without complications: Secondary | ICD-10-CM | POA: Insufficient documentation

## 2012-10-03 DIAGNOSIS — E785 Hyperlipidemia, unspecified: Secondary | ICD-10-CM | POA: Insufficient documentation

## 2012-10-03 DIAGNOSIS — C8589 Other specified types of non-Hodgkin lymphoma, extranodal and solid organ sites: Secondary | ICD-10-CM | POA: Insufficient documentation

## 2012-10-03 DIAGNOSIS — C8590 Non-Hodgkin lymphoma, unspecified, unspecified site: Secondary | ICD-10-CM

## 2012-10-03 DIAGNOSIS — Z923 Personal history of irradiation: Secondary | ICD-10-CM | POA: Insufficient documentation

## 2012-10-03 DIAGNOSIS — Z79899 Other long term (current) drug therapy: Secondary | ICD-10-CM | POA: Insufficient documentation

## 2012-10-03 LAB — CBC
HCT: 35.7 % — ABNORMAL LOW (ref 36.0–46.0)
Hemoglobin: 12 g/dL (ref 12.0–15.0)
MCV: 91.5 fL (ref 78.0–100.0)
RDW: 15.7 % — ABNORMAL HIGH (ref 11.5–15.5)
WBC: 7.4 10*3/uL (ref 4.0–10.5)

## 2012-10-03 LAB — GLUCOSE, CAPILLARY: Glucose-Capillary: 114 mg/dL — ABNORMAL HIGH (ref 70–99)

## 2012-10-03 LAB — APTT: aPTT: 26 seconds (ref 24–37)

## 2012-10-03 LAB — PROTIME-INR: INR: 1.01 (ref 0.00–1.49)

## 2012-10-03 MED ORDER — SODIUM CHLORIDE 0.9 % IV SOLN
INTRAVENOUS | Status: DC
  Administered 2012-10-03: 12:00:00 via INTRAVENOUS

## 2012-10-03 MED ORDER — FENTANYL CITRATE 0.05 MG/ML IJ SOLN
INTRAMUSCULAR | Status: AC | PRN
  Administered 2012-10-03: 50 ug via INTRAVENOUS
  Administered 2012-10-03: 100 ug via INTRAVENOUS

## 2012-10-03 MED ORDER — MIDAZOLAM HCL 2 MG/2ML IJ SOLN
INTRAMUSCULAR | Status: AC
  Filled 2012-10-03: qty 6

## 2012-10-03 MED ORDER — MIDAZOLAM HCL 2 MG/2ML IJ SOLN
INTRAMUSCULAR | Status: AC | PRN
  Administered 2012-10-03: 1 mg via INTRAVENOUS
  Administered 2012-10-03: 0.5 mg via INTRAVENOUS
  Administered 2012-10-03: 1 mg via INTRAVENOUS

## 2012-10-03 MED ORDER — FENTANYL CITRATE 0.05 MG/ML IJ SOLN
INTRAMUSCULAR | Status: AC
  Filled 2012-10-03: qty 6

## 2012-10-03 NOTE — Procedures (Signed)
Technically successful CT guided biopsy of enlarging RP nodal mass.  No immediate post procedural complications.

## 2012-10-03 NOTE — H&P (Signed)
[name] is an 72 y.o. female.   Chief Complaint: "I'm having a biopsy" HPI: Patient with history of recurrent high grade NHL and enlarging retroperitoneal adenopathy following chemotherapy  presents today for CT guided RP node biopsy.  Past Medical History  Diagnosis Date  . Sick sinus syndrome   . Hematoma     At the site of the pacemaker insertion.  . Hypotension   . Dyslipidemia   . Diabetes mellitus   . Pacemaker   . History of atrial fibrillation   . Arthritis     Osteoarthritis  . Hypothyroidism   . Port-a-cath in place 12/08/10  . Status post chemotherapy completed 03/2011    R - CHOP q 3 weeks x 6  . S/P radiation therapy 05/28/2011 - 06/26/2011    Right Abdomen and Right Pelvis/3060 cGy in 17 Fractions  . Diffuse large B cell lymphoma   . Cancer 10/2010    large B cell lymphoma  . 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 Pacemaker insertion      Lead revision completed August 04, 2006  . Cholecystectomy    . Tubal ligation      Bilateral  . Insert / replace / remove pacemaker    . Abdominal hysterectomy    . Biopsy stomach  11/03/10    Soft Tissue Mass, Biopsy, Right Lower Quadrant Mesenteric Mass- High Grade Non-Hodgkins B Cell Lymphoma  with Flow Cytometry  . Bone biopsy  11/24/10    Bone Marrow, Aspirate Biospy, and clot, Left - No Involvement of Non-Hodgkin's Lymphoma Identified    Family History  Problem Relation Age of Onset  . Stroke Mother   . Stroke Father   . Aneurysm Son     Brain  . Cancer Sister      1 sister had vaginal cancer  . Cancer Brother     Prostate   Social History:  reports that she has been smoking Cigarettes.  She has a 45 pack-year smoking history. She has never used smokeless tobacco. She reports that she does not drink alcohol or use illicit drugs.  Allergies:  Allergies  Allergen Reactions  . Codeine Nausea Only    Current  outpatient prescriptions:albuterol (PROVENTIL) (2.5 MG/3ML) 0.083% nebulizer solution, Take 2.5 mg by nebulization every 6 (six) hours as needed., Disp: , Rfl: ;  allopurinol (ZYLOPRIM) 300 MG tablet, Take 300 mg by mouth daily., Disp: , Rfl: ;  Cholecalciferol (VITAMIN D3) 2000 UNITS TABS, Take 2,000 Units by mouth 2 (two) times daily. , Disp: , Rfl: ;  citalopram (CELEXA) 20 MG tablet, Take 20 mg by mouth every morning. , Disp: , Rfl:  diltiazem (CARDIZEM CD) 180 MG 24 hr capsule, Take 180 mg by mouth every morning. , Disp: , Rfl: ;  levothyroxine (SYNTHROID, LEVOTHROID) 100 MCG tablet, Take 100 mcg by mouth every morning. , Disp: , Rfl: ;  lidocaine-prilocaine (EMLA) cream, Apply topically as needed. Apply one hour before procedure as directed., Disp: 30 g, Rfl: 0 loperamide (IMODIUM) 2 MG capsule, Take 2 mg by mouth 4 (four) times daily as needed for diarrhea or loose stools., Disp: , Rfl: ;  metFORMIN (GLUCOPHAGE) 500 MG tablet, Take 1,000 mg by mouth 2 (two) times daily. , Disp: , Rfl: ;  ondansetron (ZOFRAN) 8 MG tablet, Take 1 tablet (8 mg total) by mouth every 12 (twelve) hours as needed for nausea., Disp: 20 tablet,  Rfl: 3 pravastatin (PRAVACHOL) 40 MG tablet, Take 40 mg by mouth at bedtime. , Disp: , Rfl: ;  prochlorperazine (COMPAZINE) 10 MG tablet, Take 1 tablet (10 mg total) by mouth every 6 (six) hours as needed., Disp: 30 tablet, Rfl: 3;  warfarin (COUMADIN) 6 MG tablet, Take 3-6 mg by mouth daily. Pt takes 3mg  on some days and 6mg  on other days. Pt lost her instructions but is currently not taking until after procedure. Take as directed., Disp: , Rfl:  Current facility-administered medications:0.9 %  sodium chloride infusion, , Intravenous, Continuous, Brayton El, PA-C 10/03/12  LABS PENDING  Review of Systems  Constitutional: Negative for fever and chills.  Respiratory:       Occ cough and some dyspnea with exertion  Cardiovascular: Negative for chest pain.  Gastrointestinal: Negative  for nausea, vomiting and abdominal pain.  Musculoskeletal: Negative for back pain.  Neurological: Negative for headaches.  Endo/Heme/Allergies: Does not bruise/bleed easily.  Vitals: BP 137/63  HR  79  R 18  TEMP 98.8  O2 SATS 94% RA  Physical Exam  Constitutional: She is oriented to person, place, and time. She appears well-developed and well-nourished.  Cardiovascular: Normal rate and regular rhythm.   Pt has left chest pacemaker and rt chest port a cath  Respiratory: Effort normal and breath sounds normal.  GI: Soft. Bowel sounds are normal. There is no tenderness.  obese  Musculoskeletal: Normal range of motion. She exhibits no edema.  Neurological: She is alert and oriented to person, place, and time.     Assessment/Plan Pt with hx of  recurrent high grade NHL and enlarging RP adenopathy despite chemotherapy. Plan is for CT guided RP lymph node biopsy today. Details/risks of procedure d/w pt/nieces with their understanding and consent.  Mikkel Charrette,D KEVIN 10/03/2012, 11:43 AM

## 2012-10-03 NOTE — Telephone Encounter (Signed)
If she wants more chemo.  She won't be hospice appropriate.  Even without chemo, her life expectancy is most likely more than 2 months.    However, any MD can order hospice.  Not must [place].  If that her MD thinks is best, I won't disagree with him.

## 2012-10-03 NOTE — Telephone Encounter (Signed)
Received call from [facility], they rec'd order for Hospice from Dr. Oneta Rack.  She is asking if Dr. Gaylyn Rong feels pt is Hospice appropriate and if there is any further chemo planned for this pt?

## 2012-10-04 NOTE — Telephone Encounter (Signed)
Informed [name] at [facility] of Dr. Lodema Pilot reply below.  She says they s/w Dr. Kathryne Sharper office and will wait on referral until after pt comes back to see Dr. Gaylyn Rong on 7/03 to see if she will pursue any further chemotherapy.

## 2012-10-06 ENCOUNTER — Telehealth: Payer: Self-pay | Admitting: *Deleted

## 2012-10-06 NOTE — Telephone Encounter (Signed)
Pt called for results of her Biopsy.  Informed her the results are not back yet and she may have to wait until her office visit next week on 7/03 to get the results.  I will let Dr. Gaylyn Rong know she is asking about the results.  She verbalized understanding.

## 2012-10-13 ENCOUNTER — Other Ambulatory Visit (HOSPITAL_BASED_OUTPATIENT_CLINIC_OR_DEPARTMENT_OTHER): Payer: Medicare Other | Admitting: Lab

## 2012-10-13 ENCOUNTER — Telehealth: Payer: Self-pay | Admitting: Oncology

## 2012-10-13 ENCOUNTER — Ambulatory Visit (HOSPITAL_BASED_OUTPATIENT_CLINIC_OR_DEPARTMENT_OTHER): Payer: Medicare Other | Admitting: Oncology

## 2012-10-13 VITALS — BP 121/76 | HR 88 | Temp 98.7°F | Resp 20 | Ht <= 58 in | Wt 232.7 lb

## 2012-10-13 DIAGNOSIS — C8590 Non-Hodgkin lymphoma, unspecified, unspecified site: Secondary | ICD-10-CM

## 2012-10-13 DIAGNOSIS — C8589 Other specified types of non-Hodgkin lymphoma, extranodal and solid organ sites: Secondary | ICD-10-CM

## 2012-10-13 DIAGNOSIS — C8330 Diffuse large B-cell lymphoma, unspecified site: Secondary | ICD-10-CM

## 2012-10-13 LAB — COMPREHENSIVE METABOLIC PANEL (CC13)
AST: 20 U/L (ref 5–34)
Albumin: 3.4 g/dL — ABNORMAL LOW (ref 3.5–5.0)
BUN: 17.5 mg/dL (ref 7.0–26.0)
CO2: 26 mEq/L (ref 22–29)
Calcium: 10.7 mg/dL — ABNORMAL HIGH (ref 8.4–10.4)
Chloride: 101 mEq/L (ref 98–109)
Creatinine: 1.7 mg/dL — ABNORMAL HIGH (ref 0.6–1.1)
Glucose: 140 mg/dl (ref 70–140)
Potassium: 4.3 mEq/L (ref 3.5–5.1)

## 2012-10-13 LAB — CBC WITH DIFFERENTIAL/PLATELET
Basophils Absolute: 0.1 10*3/uL (ref 0.0–0.1)
EOS%: 4.1 % (ref 0.0–7.0)
Eosinophils Absolute: 0.3 10*3/uL (ref 0.0–0.5)
HCT: 35.3 % (ref 34.8–46.6)
HGB: 12.2 g/dL (ref 11.6–15.9)
MCH: 31.4 pg (ref 25.1–34.0)
MONO#: 1.1 10*3/uL — ABNORMAL HIGH (ref 0.1–0.9)
NEUT#: 4.5 10*3/uL (ref 1.5–6.5)
NEUT%: 54.7 % (ref 38.4–76.8)
RDW: 16.4 % — ABNORMAL HIGH (ref 11.2–14.5)
lymph#: 2.3 10*3/uL (ref 0.9–3.3)

## 2012-10-13 NOTE — Telephone Encounter (Signed)
Pt is aware of her appt. At [facility]

## 2012-10-13 NOTE — Telephone Encounter (Signed)
Pt appt. With Dr. Vear Clock @ [facility] is 10/20/12@1 :00.  Faxed medical records, slides and scans will be fedex'ed. Called pt no vm will continue to call.

## 2012-10-15 ENCOUNTER — Encounter: Payer: Self-pay | Admitting: Oncology

## 2012-10-15 NOTE — Progress Notes (Signed)
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   PASTTHERAPY: q3wk R-CHOP x6 cycles; finished in Dec 2012; followed by consolidative radiation therapy.    CURRENT THERAPY: recurrent disease; started on bendamustine/Rituxan on 06/29/2012.   INTERVAL HISTORY: [name] 72 y.o. female returns for regular follow up with her 2 nieces. Being off of chemo, she reported feeling at her baseline.  At baseline, she is wheelchair bound due to morbid obesity, and osteoarthritis.  She denied abdominal pain, constipation/diarrhea, bleeding symptoms, night sweat, neuropathy.  The rest of the 14-point review of system was negative.      Past Medical History  Diagnosis Date  . Sick sinus syndrome   . Hematoma     At the site of the pacemaker insertion.  . Hypotension   . Dyslipidemia   . Diabetes mellitus   . Pacemaker   . History of atrial fibrillation   . Arthritis     Osteoarthritis  . Hypothyroidism   . Port-a-cath in place 12/08/10  . Status post chemotherapy completed 03/2011    R - CHOP q 3 weeks x 6  . S/P radiation therapy 05/28/2011 - 06/26/2011    Right Abdomen and Right Pelvis/3060 cGy in 17 Fractions  . Diffuse large B cell lymphoma   . Cancer 10/2010    large B cell lymphoma  . 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 Pacemaker insertion      Lead revision completed August 04, 2006  . Cholecystectomy    . Tubal ligation      Bilateral  . Insert / replace / remove pacemaker    . Abdominal hysterectomy    . Biopsy stomach  11/03/10    Soft Tissue Mass, Biopsy, Right Lower Quadrant Mesenteric Mass- High Grade Non-Hodgkins B Cell Lymphoma  with Flow Cytometry  . Bone biopsy  11/24/10    Bone Marrow, Aspirate Biospy, and clot, Left - No Involvement of Non-Hodgkin's  Lymphoma Identified    Current Outpatient Prescriptions  Medication Sig Dispense Refill  . albuterol (PROVENTIL) (2.5 MG/3ML) 0.083% nebulizer solution Take 2.5 mg by nebulization every 6 (six) hours as needed.      Marland Kitchen allopurinol (ZYLOPRIM) 300 MG tablet Take 300 mg by mouth daily.      . Cholecalciferol (VITAMIN D3) 2000 UNITS TABS Take 2,000 Units by mouth 2 (two) times daily.       . citalopram (CELEXA) 20 MG tablet Take 20 mg by mouth every morning.       . diltiazem (CARDIZEM CD) 180 MG 24 hr capsule Take 180 mg by mouth every morning.       Marland Kitchen levothyroxine (SYNTHROID, LEVOTHROID) 100 MCG tablet Take 100 mcg by mouth every morning.       . lidocaine-prilocaine (EMLA) cream Apply topically as needed. Apply one hour before procedure as directed.  30 g  0  . loperamide (IMODIUM) 2 MG capsule Take 2 mg by mouth 4 (four) times daily as needed for diarrhea or loose stools.      . metFORMIN (GLUCOPHAGE) 500 MG tablet Take 1,000 mg by mouth 2 (two) times daily.       . ondansetron (ZOFRAN) 8 MG tablet Take 1 tablet (8 mg total) by mouth every 12 (twelve) hours as needed for nausea.  20 tablet  3  .  pravastatin (PRAVACHOL) 40 MG tablet Take 40 mg by mouth at bedtime.       . prochlorperazine (COMPAZINE) 10 MG tablet Take 1 tablet (10 mg total) by mouth every 6 (six) hours as needed.  30 tablet  3  . warfarin (COUMADIN) 6 MG tablet Take 6 mg by mouth daily. 6 mg on Mon, Wed, Fri, Sat and Sundays.  3 mg on Tues and Thurs       No current facility-administered medications for this visit.    ALLERGIES:  is allergic to codeine.  REVIEW OF SYSTEMS:  The rest of the 14-point review of system was negative.   Filed Vitals:   10/13/12 0929  BP: 121/76  Pulse: 88  Temp: 98.7 F (37.1 C)  Resp: 20   Wt Readings from Last 3 Encounters:  10/13/12 232 lb 11.2 oz (105.552 kg)  10/03/12 235 lb (106.595 kg)  09/21/12 235 lb 8 oz (106.822 kg)   ECOG Performance status: 2 due to obesity,  osteoarthritis.   PHYSICAL EXAMINATION:  General:  Obese woman, in wheelchair, in no acute distress.  Eyes:  no scleral icterus.  ENT:  There were no oropharyngeal lesions.  Neck was without thyromegaly.  Lymphatics:  Negative cervical, supraclavicular or axillary adenopathy.  Respiratory: lungs were clear bilaterally without wheezing or crackles.  Cardiovascular:  Regular rate and rhythm, S1/S2, without murmur, rub or gallop.  There was no pedal edema.  GI:  abdomen was soft, obese, nontender, nondistended, without organomegaly.  Muscoloskeletal:  no spinal tenderness of palpation of vertebral spine.  Skin exam was without echymosis, petichae.       LABORATORY/RADIOLOGY DATA:  Lab Results  Component Value Date   WBC 8.2 10/13/2012   HGB 12.2 10/13/2012   HCT 35.3 10/13/2012   PLT 303 10/13/2012   GLUCOSE 140 10/13/2012   ALKPHOS 51 10/13/2012   ALT 15 10/13/2012   AST 20 10/13/2012   NA 138 10/13/2012   K 4.3 10/13/2012   CL 102 09/21/2012   CREATININE 1.7* 10/13/2012   BUN 17.5 10/13/2012   CO2 26 10/13/2012   INR 1.01 10/03/2012    ASSESSMENT AND PLAN:    1. Recurrent high grade NHL:  - Status:  After 3 cycle of chemo Bendamustine/Rituxan for presumed recurrent disease, the mass got bigger.  Biopsy proven again high grade B-cell Non Hodgkin's lymphoma.  - Recommendations:  *  Referral to [facility] to see if she is a candidate for autologous stem cell transplant given refractory nature of her recurrent NHL.  Even though she has borderline performance status, she tolerated R-CHOP and R-Bendamustine without any dose-limiting side effects.  If she is a candidate for transplant, I will consider R-ICE regimen as a bridge to transplant.  *  If she is deemed not candidate for transplant, I may consider Revlimid as salvage, palliative chemo option.  My concern is that aggressive salvage chemo option such as RICE, R-DHAP without transplant as consolidative measure will result in more side effects than warranted.     [name] and her nieces expressed informed understanding and agreed to go to [facility] for BMT eval.  2. Hypothyroidism: She has levothyroxine per PCP. 3. Hypertension: She is on diltiazem PCP.  4. Diabetes mellitus, type II: She is on metformin per PCP.  5. Hyperlipidemia: She is on pravastatin per PCP.  6. History of atrial fibrillation: She is on diltiazem, and she has a pacemaker. She is on Coumadin being managed per her PCP.  7. Follow  in about 2-3 weeks to finalize treatment plan.      I informed Ms. Sones that I am leaving the practice.  [facility] will arrange for her to see another provider when she returns.     The length of time of the face-to-face encounter was 25 minutes. More than 50% of time was spent counseling and coordination of care.        Huan T. Gaylyn Rong, M.D.

## 2012-10-15 NOTE — Treatment Plan (Signed)
[facility] END OF TREATMENT   Name: [name] Date: 10/15/2012 MRN: [id number] DOB: [birth year]   TREATMENT DATES: 06/29/12 to 08/26/12.    REFERRING PHYSICIAN: Almond Lint, M.D.   DIAGNOSIS: high grade B-cell Non Hodgkin's lymphoma.   STAGE AT START OF TREATMENT:   IV    INTENT:Palliative   DRUGS OR REGIMENS GIVEN:  Bendamustine/Rituxan.    MAJOR TOXICITIES:  Grade 2 fatigue, grade 1 diarrhea.    REASON TREATMENT STOPPED:  Disease progression.    PERFORMANCE STATUS AT END:  ECOG 2    ONGOING PROBLEMS:  Disease relapse.    FOLLOW UP PLANS:  Referral to [facility] to see if she is a candidate for autologous BMT.

## 2012-10-17 ENCOUNTER — Telehealth: Payer: Self-pay | Admitting: Oncology

## 2012-10-20 NOTE — Progress Notes (Signed)
Patient scheduled for admission to hospital for chemotherapy treatment on 10/25/12;  Lanora Manis called to pre-cert patient for inpatient stay X 3 days; spoke to [place], [place] on [facility] to have chemotherapy certified nurse on staff to administer chemotherapy on days patient will be admitted; bed placement called to reserve bed for patient on these days.

## 2012-10-21 ENCOUNTER — Telehealth: Payer: Self-pay | Admitting: *Deleted

## 2012-10-21 ENCOUNTER — Other Ambulatory Visit: Payer: Self-pay | Admitting: Oncology

## 2012-10-21 NOTE — Progress Notes (Signed)
Admission planned for 10/25/12 cancelled, per Dr.Ha; bed control, [facility], pharmacy and Lanora Manis in pre-cert notified.

## 2012-10-21 NOTE — Telephone Encounter (Signed)
sw pt gv appt d/t for 10/25/12 labs @9 :30am and ov @10am . i also made her aware that she will come in on 10/26/12 @11am  for her tx. i informed the pt of her other appts that i had scheduled for her as well. she's aware and plans on getting a printed scheduled on 10/25/12...td

## 2012-10-21 NOTE — Telephone Encounter (Signed)
Per staff phone call and POF I have schedueld appts. No openings on 7/15 for treatment, moved to 7/16.   JMW

## 2012-10-25 ENCOUNTER — Ambulatory Visit (HOSPITAL_BASED_OUTPATIENT_CLINIC_OR_DEPARTMENT_OTHER): Payer: Medicare Other | Admitting: Oncology

## 2012-10-25 ENCOUNTER — Ambulatory Visit: Payer: Medicare Other | Admitting: Oncology

## 2012-10-25 ENCOUNTER — Other Ambulatory Visit (HOSPITAL_BASED_OUTPATIENT_CLINIC_OR_DEPARTMENT_OTHER): Payer: Medicare Other | Admitting: Lab

## 2012-10-25 ENCOUNTER — Other Ambulatory Visit: Payer: Medicare Other | Admitting: Lab

## 2012-10-25 ENCOUNTER — Inpatient Hospital Stay: Admission: AD | Admit: 2012-10-25 | Payer: Medicare Other | Source: Ambulatory Visit | Admitting: Oncology

## 2012-10-25 VITALS — BP 115/77 | HR 93 | Temp 97.1°F | Resp 17 | Ht <= 58 in | Wt 235.5 lb

## 2012-10-25 DIAGNOSIS — C8589 Other specified types of non-Hodgkin lymphoma, extranodal and solid organ sites: Secondary | ICD-10-CM

## 2012-10-25 DIAGNOSIS — C8330 Diffuse large B-cell lymphoma, unspecified site: Secondary | ICD-10-CM

## 2012-10-25 DIAGNOSIS — C8590 Non-Hodgkin lymphoma, unspecified, unspecified site: Secondary | ICD-10-CM

## 2012-10-25 LAB — COMPREHENSIVE METABOLIC PANEL (CC13)
ALT: 16 U/L (ref 0–55)
AST: 19 U/L (ref 5–34)
Albumin: 3.5 g/dL (ref 3.5–5.0)
BUN: 16.8 mg/dL (ref 7.0–26.0)
CO2: 26 mEq/L (ref 22–29)
Calcium: 10.6 mg/dL — ABNORMAL HIGH (ref 8.4–10.4)
Chloride: 100 mEq/L (ref 98–109)
Potassium: 3.9 mEq/L (ref 3.5–5.1)

## 2012-10-25 LAB — CBC WITH DIFFERENTIAL/PLATELET
BASO%: 1.2 % (ref 0.0–2.0)
Basophils Absolute: 0.1 10*3/uL (ref 0.0–0.1)
EOS%: 3.3 % (ref 0.0–7.0)
HCT: 34.8 % (ref 34.8–46.6)
HGB: 11.7 g/dL (ref 11.6–15.9)
MCH: 30.7 pg (ref 25.1–34.0)
MONO#: 0.9 10*3/uL (ref 0.1–0.9)
NEUT#: 4.4 10*3/uL (ref 1.5–6.5)
NEUT%: 58.7 % (ref 38.4–76.8)
RDW: 16 % — ABNORMAL HIGH (ref 11.2–14.5)
WBC: 7.5 10*3/uL (ref 3.9–10.3)
lymph#: 1.9 10*3/uL (ref 0.9–3.3)

## 2012-10-25 MED ORDER — PROMETHAZINE HCL 25 MG RE SUPP: 25.0000 mg | Freq: Two times a day (BID) | RECTAL | Status: DC | PRN

## 2012-10-25 MED ORDER — DEXAMETHASONE 4 MG PO TABS: 40.0000 mg | ORAL_TABLET | Freq: Every day | ORAL | Status: DC

## 2012-10-26 ENCOUNTER — Ambulatory Visit (HOSPITAL_BASED_OUTPATIENT_CLINIC_OR_DEPARTMENT_OTHER): Payer: Medicare Other

## 2012-10-26 VITALS — BP 106/59 | HR 84 | Temp 96.3°F | Resp 20

## 2012-10-26 DIAGNOSIS — C8590 Non-Hodgkin lymphoma, unspecified, unspecified site: Secondary | ICD-10-CM

## 2012-10-26 DIAGNOSIS — C8589 Other specified types of non-Hodgkin lymphoma, extranodal and solid organ sites: Secondary | ICD-10-CM

## 2012-10-26 DIAGNOSIS — Z5112 Encounter for antineoplastic immunotherapy: Secondary | ICD-10-CM

## 2012-10-26 DIAGNOSIS — C8330 Diffuse large B-cell lymphoma, unspecified site: Secondary | ICD-10-CM

## 2012-10-26 DIAGNOSIS — Z5111 Encounter for antineoplastic chemotherapy: Secondary | ICD-10-CM

## 2012-10-26 MED ORDER — SODIUM CHLORIDE 0.9 % IV SOLN
750.0000 mg/m2 | Freq: Once | INTRAVENOUS | Status: AC
  Administered 2012-10-26: 1520 mg via INTRAVENOUS
  Filled 2012-10-26: qty 40

## 2012-10-26 MED ORDER — HEPARIN SOD (PORK) LOCK FLUSH 100 UNIT/ML IV SOLN
500.0000 [IU] | Freq: Once | INTRAVENOUS | Status: AC | PRN
  Administered 2012-10-26: 500 [IU]
  Filled 2012-10-26: qty 5

## 2012-10-26 MED ORDER — SODIUM CHLORIDE 0.9 % IV SOLN: 375.0000 mg/m2 | Freq: Once | INTRAVENOUS | Status: DC

## 2012-10-26 MED ORDER — PALONOSETRON HCL INJECTION 0.25 MG/5ML
0.2500 mg | Freq: Once | INTRAVENOUS | Status: AC
  Administered 2012-10-26: 0.25 mg via INTRAVENOUS

## 2012-10-26 MED ORDER — SODIUM CHLORIDE 0.9 % IV SOLN
150.0000 mg | Freq: Once | INTRAVENOUS | Status: AC
  Administered 2012-10-26: 150 mg via INTRAVENOUS
  Filled 2012-10-26: qty 5

## 2012-10-26 MED ORDER — SODIUM CHLORIDE 0.9 % IV SOLN
37.5000 mg/m2 | Freq: Once | INTRAVENOUS | Status: AC
  Administered 2012-10-26: 77 mg via INTRAVENOUS
  Filled 2012-10-26: qty 77

## 2012-10-26 MED ORDER — DEXAMETHASONE SODIUM PHOSPHATE 20 MG/5ML IJ SOLN
12.0000 mg | Freq: Once | INTRAMUSCULAR | Status: AC
  Administered 2012-10-26: 12 mg via INTRAVENOUS

## 2012-10-26 MED ORDER — SODIUM CHLORIDE 0.9 % IJ SOLN
10.0000 mL | INTRAMUSCULAR | Status: DC | PRN
  Administered 2012-10-26: 10 mL
  Filled 2012-10-26: qty 10

## 2012-10-26 MED ORDER — SODIUM CHLORIDE 0.9 % IV SOLN
Freq: Once | INTRAVENOUS | Status: AC
  Administered 2012-10-26: 12:00:00 via INTRAVENOUS

## 2012-10-26 MED ORDER — DIPHENHYDRAMINE HCL 25 MG PO CAPS
50.0000 mg | ORAL_CAPSULE | Freq: Once | ORAL | Status: AC
  Administered 2012-10-26: 50 mg via ORAL

## 2012-10-26 MED ORDER — SODIUM CHLORIDE 0.9 % IV SOLN
375.0000 mg/m2 | Freq: Once | INTRAVENOUS | Status: AC
  Administered 2012-10-26: 800 mg via INTRAVENOUS
  Filled 2012-10-26: qty 80

## 2012-10-26 MED ORDER — POTASSIUM CHLORIDE 2 MEQ/ML IV SOLN
Freq: Once | INTRAVENOUS | Status: AC
  Administered 2012-10-26: 12:00:00 via INTRAVENOUS
  Filled 2012-10-26: qty 10

## 2012-10-26 MED ORDER — ACETAMINOPHEN 325 MG PO TABS
650.0000 mg | ORAL_TABLET | Freq: Once | ORAL | Status: AC
  Administered 2012-10-26: 650 mg via ORAL

## 2012-10-26 NOTE — Patient Instructions (Addendum)
1.  Diagnosis:  Relapsed high grade non-Hodgkin's B cell lymphoma. 2.  Recommendation:  *  Proceed with salvage chemo: R-GPD (Rituxan day1; Gemzar day 1 and 8; Cisplatin day 1; and Dexamethasone IV day 1 and oral days 2, 3, and 4).  Neulasta on day 9 to decrease risk of infection.  *  Potential side effects of this chemo regimen include but not limited to fatigue, mouth sore, fever, low blood count, infection, bleeding, skin rash, pneumonitis.  *  After about 2-3 cycles, we will repeat scan to ensure that it's working.  *  If and when remission is achieve, we can proceed with autologous bone marrow transplant at [facility] with Dr. Loistine Chance.   3.  Follow up:  With [place] in about 1 week before cycle #1, day #8 of chemo.

## 2012-10-26 NOTE — Progress Notes (Signed)
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   PASTTHERAPY: q3wk R-CHOP x6 cycles; finished in Dec 2012; followed by consolidative radiation therapy.    CURRENT THERAPY: recurrent disease; started on bendamustine/Rituxan on 06/29/2012.   INTERVAL HISTORY: [name] 72 y.o. female returns for regular follow up with her 2 nieces. She reports feeling relative well.  She is able to move around the house by herself with extreme preacaution.  She cannot ambulate long distance due to severe osteoarthritis.  He denied abdominal pain, bleeding, frank diarrhea, nausea/vomiting. The rest of the 14-point review of system was negative.      Past Medical History  Diagnosis Date  . Sick sinus syndrome   . Hematoma     At the site of the pacemaker insertion.  . Hypotension   . Dyslipidemia   . Diabetes mellitus   . Pacemaker   . History of atrial fibrillation   . Arthritis     Osteoarthritis  . Hypothyroidism   . Port-a-cath in place 12/08/10  . Status post chemotherapy completed 03/2011    R - CHOP q 3 weeks x 6  . S/P radiation therapy 05/28/2011 - 06/26/2011    Right Abdomen and Right Pelvis/3060 cGy in 17 Fractions  . Diffuse large B cell lymphoma   . Cancer 10/2010    large B cell lymphoma  . 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 Pacemaker insertion      Lead revision completed August 04, 2006  . Cholecystectomy    . Tubal ligation      Bilateral  . Insert / replace / remove pacemaker    . Abdominal hysterectomy    . Biopsy stomach  11/03/10    Soft Tissue Mass, Biopsy, Right Lower Quadrant Mesenteric Mass- High Grade Non-Hodgkins B Cell Lymphoma  with Flow Cytometry  . Bone biopsy  11/24/10    Bone Marrow, Aspirate Biospy, and clot, Left - No Involvement of  Non-Hodgkin's Lymphoma Identified    Current Outpatient Prescriptions  Medication Sig Dispense Refill  . albuterol (PROVENTIL) (2.5 MG/3ML) 0.083% nebulizer solution Take 2.5 mg by nebulization every 6 (six) hours as needed.      Marland Kitchen allopurinol (ZYLOPRIM) 300 MG tablet Take 300 mg by mouth daily.      . Cholecalciferol (VITAMIN D3) 2000 UNITS TABS Take 2,000 Units by mouth 2 (two) times daily.       . citalopram (CELEXA) 20 MG tablet Take 20 mg by mouth every morning.       Marland Kitchen dexamethasone (DECADRON) 4 MG tablet Take 10 tablets (40 mg total) by mouth daily.  30 tablet  2  . diltiazem (CARDIZEM CD) 180 MG 24 hr capsule Take 180 mg by mouth every morning.       Marland Kitchen levothyroxine (SYNTHROID, LEVOTHROID) 100 MCG tablet Take 100 mcg by mouth every morning.       . lidocaine-prilocaine (EMLA) cream Apply topically as needed. Apply one hour before procedure as directed.  30 g  0  . loperamide (IMODIUM) 2 MG capsule Take 2 mg by mouth 4 (four) times daily as needed for diarrhea or loose stools.      . metFORMIN (GLUCOPHAGE) 500 MG tablet Take 1,000 mg by mouth 2 (two) times daily.       . ondansetron (ZOFRAN)  8 MG tablet Take 1 tablet (8 mg total) by mouth every 12 (twelve) hours as needed for nausea.  20 tablet  3  . pravastatin (PRAVACHOL) 40 MG tablet Take 40 mg by mouth at bedtime.       . prochlorperazine (COMPAZINE) 10 MG tablet Take 1 tablet (10 mg total) by mouth every 6 (six) hours as needed.  30 tablet  3  . promethazine (PHENERGAN) 25 MG suppository Place 1 suppository (25 mg total) rectally every 12 (twelve) hours as needed for nausea.  12 each  0  . warfarin (COUMADIN) 6 MG tablet Take 6 mg by mouth daily. 6 mg on Mon, Wed, Fri, Sat and Sundays.  3 mg on Tues and Thurs       No current facility-administered medications for this visit.    ALLERGIES:  is allergic to codeine.  REVIEW OF SYSTEMS:  The rest of the 14-point review of system was negative.   Filed Vitals:   10/25/12 0949  BP:  115/77  Pulse: 93  Temp: 97.1 F (36.2 C)  Resp: 17   Wt Readings from Last 3 Encounters:  10/25/12 235 lb 8 oz (106.822 kg)  10/13/12 232 lb 11.2 oz (105.552 kg)  10/03/12 235 lb (106.595 kg)   ECOG Performance status: 2 due to obesity, osteoarthritis.   PHYSICAL EXAMINATION:  General:  Obese woman, in wheelchair, in no acute distress.  Eyes:  no scleral icterus.  ENT:  There were no oropharyngeal lesions.  Neck was without thyromegaly.  Lymphatics:  Negative cervical, supraclavicular or axillary adenopathy.  Respiratory: lungs were clear bilaterally without wheezing or crackles.  Cardiovascular:  Regular rate and rhythm, S1/S2, without murmur, rub or gallop.  There was no pedal edema.  GI:  abdomen was soft, obese, nontender, nondistended, without organomegaly.  Muscoloskeletal:  no spinal tenderness of palpation of vertebral spine.  Skin exam was without echymosis, petichae.     LABORATORY/RADIOLOGY DATA:  Lab Results  Component Value Date   WBC 7.5 10/25/2012   HGB 11.7 10/25/2012   HCT 34.8 10/25/2012   PLT 337 10/25/2012   GLUCOSE 164* 10/25/2012   ALKPHOS 64 10/25/2012   ALT 16 10/25/2012   AST 19 10/25/2012   NA 140 10/25/2012   K 3.9 10/25/2012   CL 102 09/21/2012   CREATININE 1.6* 10/25/2012   BUN 16.8 10/25/2012   CO2 26 10/25/2012   INR 1.01 10/03/2012    ASSESSMENT AND PLAN:    1. Recurrent high grade NHL:  -  Diagnosis:  Relapsed high grade non-Hodgkin's B cell lymphoma. -  Recommendation:  *  Proceed with salvage chemo: R-GPD (Rituxan day1; Gemzar day 1 and 8; Cisplatin day 1; and Dexamethasone IV day 1 and oral days 2, 3, and 4).  Neulasta on day 9 to decrease risk of infection.  *  Potential side effects of this chemo regimen include but not limited to fatigue, mouth sore, fever, low blood count, infection, bleeding, skin rash, pneumonitis.  *  After about 2-3 cycles, we will repeat scan to assess response.   *  If and when remission is achieve, we can proceed with  autologous bone marrow transplant at [facility] with Dr. Loistine Chance.    -  Follow up:  With [place] in about 1 week before cycle #1, day #8 of chemo.   Ms. Wind and her nieces expressed informed understanding and agreed to go to proceed with chemo today.  2. Hypothyroidism: She has levothyroxine per PCP. 3. Hypertension: She  is on diltiazem PCP.  4. Diabetes mellitus, type II: She is on metformin per PCP.  5. Hyperlipidemia: She is on pravastatin per PCP.  6. History of atrial fibrillation: She is on diltiazem, and she has a pacemaker. She is on Coumadin being managed per her PCP.      I informed Ms. Mangino that I am leaving the practice.  [facility] will arrange for her to see another provider when she returns.     The length of time of the face-to-face encounter was 25 minutes. More than 50% of time was spent counseling and coordination of care.        Huan T. Gaylyn Rong, M.D.

## 2012-10-27 ENCOUNTER — Other Ambulatory Visit: Payer: Self-pay | Admitting: Certified Registered Nurse Anesthetist

## 2012-10-27 ENCOUNTER — Telehealth: Payer: Self-pay | Admitting: *Deleted

## 2012-10-27 ENCOUNTER — Telehealth: Payer: Self-pay | Admitting: Oncology

## 2012-10-27 NOTE — Telephone Encounter (Signed)
Pt clarified her Dexamethasone instructions.  She took 10 pills (40 mg) yesterday on day one of chemo and asks if she is supposed to take every day?  Instructed pt she was to start the day After chemo on day #2, but since she started yesterday instructed her to continue today and tomorrow for total of 3 days.  Instructed to take 10 pills this morning and then tomorrow morning w/ food, then stop.  She only got #30 pills, so they should be gone after tomorrow.  Keep next appt as scheduled on 7/23.   Pt verbalized understanding and denies any nausea/vomiting or fevers.  States feeling "pretty good" today.  Verbalized understanding to call for any fevers or unrelieved symptoms.

## 2012-10-27 NOTE — Telephone Encounter (Signed)
S/w the pt and she is aware of her July 23rd appts@1 :15pm

## 2012-11-02 ENCOUNTER — Ambulatory Visit (HOSPITAL_BASED_OUTPATIENT_CLINIC_OR_DEPARTMENT_OTHER): Payer: Medicare Other | Admitting: Oncology

## 2012-11-02 ENCOUNTER — Other Ambulatory Visit (HOSPITAL_BASED_OUTPATIENT_CLINIC_OR_DEPARTMENT_OTHER): Payer: Medicare Other | Admitting: Lab

## 2012-11-02 ENCOUNTER — Telehealth: Payer: Self-pay | Admitting: Oncology

## 2012-11-02 ENCOUNTER — Ambulatory Visit (HOSPITAL_BASED_OUTPATIENT_CLINIC_OR_DEPARTMENT_OTHER): Payer: Medicare Other

## 2012-11-02 ENCOUNTER — Encounter: Payer: Self-pay | Admitting: Oncology

## 2012-11-02 VITALS — BP 116/63 | HR 67 | Temp 98.1°F | Resp 18 | Ht <= 58 in | Wt 230.5 lb

## 2012-11-02 DIAGNOSIS — Z5111 Encounter for antineoplastic chemotherapy: Secondary | ICD-10-CM

## 2012-11-02 DIAGNOSIS — C8593 Non-Hodgkin lymphoma, unspecified, intra-abdominal lymph nodes: Secondary | ICD-10-CM

## 2012-11-02 DIAGNOSIS — E119 Type 2 diabetes mellitus without complications: Secondary | ICD-10-CM

## 2012-11-02 DIAGNOSIS — I10 Essential (primary) hypertension: Secondary | ICD-10-CM

## 2012-11-02 DIAGNOSIS — C8590 Non-Hodgkin lymphoma, unspecified, unspecified site: Secondary | ICD-10-CM

## 2012-11-02 DIAGNOSIS — C8330 Diffuse large B-cell lymphoma, unspecified site: Secondary | ICD-10-CM

## 2012-11-02 DIAGNOSIS — C8583 Other specified types of non-Hodgkin lymphoma, intra-abdominal lymph nodes: Secondary | ICD-10-CM

## 2012-11-02 DIAGNOSIS — I4891 Unspecified atrial fibrillation: Secondary | ICD-10-CM

## 2012-11-02 LAB — CBC WITH DIFFERENTIAL/PLATELET
BASO%: 0.2 % (ref 0.0–2.0)
Basophils Absolute: 0 10*3/uL (ref 0.0–0.1)
EOS%: 2.6 % (ref 0.0–7.0)
HCT: 39.5 % (ref 34.8–46.6)
MCH: 29.9 pg (ref 25.1–34.0)
MCHC: 33.4 g/dL (ref 31.5–36.0)
MCV: 89.6 fL (ref 79.5–101.0)
MONO%: 1.9 % (ref 0.0–14.0)
NEUT%: 71.7 % (ref 38.4–76.8)
lymph#: 1 10*3/uL (ref 0.9–3.3)

## 2012-11-02 LAB — COMPREHENSIVE METABOLIC PANEL (CC13)
AST: 16 U/L (ref 5–34)
Albumin: 3.6 g/dL (ref 3.5–5.0)
Alkaline Phosphatase: 49 U/L (ref 40–150)
BUN: 21.6 mg/dL (ref 7.0–26.0)
Creatinine: 1.2 mg/dL — ABNORMAL HIGH (ref 0.6–1.1)
Glucose: 120 mg/dl (ref 70–140)
Potassium: 4.2 mEq/L (ref 3.5–5.1)
Total Bilirubin: 0.46 mg/dL (ref 0.20–1.20)

## 2012-11-02 MED ORDER — PROCHLORPERAZINE MALEATE 10 MG PO TABS
10.0000 mg | ORAL_TABLET | Freq: Once | ORAL | Status: AC
  Administered 2012-11-02: 10 mg via ORAL

## 2012-11-02 MED ORDER — HEPARIN SOD (PORK) LOCK FLUSH 100 UNIT/ML IV SOLN
500.0000 [IU] | Freq: Once | INTRAVENOUS | Status: AC | PRN
  Administered 2012-11-02: 500 [IU]
  Filled 2012-11-02: qty 5

## 2012-11-02 MED ORDER — SODIUM CHLORIDE 0.9 % IV SOLN
Freq: Once | INTRAVENOUS | Status: AC
  Administered 2012-11-02: 15:00:00 via INTRAVENOUS

## 2012-11-02 MED ORDER — SODIUM CHLORIDE 0.9 % IV SOLN
750.0000 mg/m2 | Freq: Once | INTRAVENOUS | Status: AC
  Administered 2012-11-02: 1520 mg via INTRAVENOUS
  Filled 2012-11-02: qty 40

## 2012-11-02 MED ORDER — SODIUM CHLORIDE 0.9 % IJ SOLN
10.0000 mL | INTRAMUSCULAR | Status: DC | PRN
  Administered 2012-11-02: 10 mL
  Filled 2012-11-02: qty 10

## 2012-11-02 NOTE — Progress Notes (Signed)
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   PASTTHERAPY: q3wk R-CHOP x6 cycles; finished in Dec 2012; followed by consolidative radiation therapy. She developed recurrent disease; started on bendamustine/Rituxan on 06/29/2012.  He completed 3 cycles on 08/25/12. Stopped due to disease progression.  CURRENT THERAPY: She started salvage chemotherapy with R-GPD on 10/26/12  INTERVAL HISTORY: [name] 72 y.o. female returns for regular follow up with a family friend. She reports feeling relative well.  She is able to move around the house by herself with extreme preacaution.  She cannot ambulate long distance due to severe osteoarthritis.  He denied abdominal pain, bleeding, frank diarrhea, nausea/vomiting. Feels as though she tolerated the R-GPD well. The rest of the 14-point review of system was negative.     Past Medical History  Diagnosis Date  . Sick sinus syndrome   . Hematoma     At the site of the pacemaker insertion.  . Hypotension   . Dyslipidemia   . Diabetes mellitus   . Pacemaker   . History of atrial fibrillation   . Arthritis     Osteoarthritis  . Hypothyroidism   . Port-a-cath in place 12/08/10  . Status post chemotherapy completed 03/2011    R - CHOP q 3 weeks x 6  . S/P radiation therapy 05/28/2011 - 06/26/2011    Right Abdomen and Right Pelvis/3060 cGy in 17 Fractions  . Diffuse large B cell lymphoma   . Cancer 10/2010    large B cell lymphoma  . 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 Pacemaker insertion      Lead revision completed August 04, 2006  . Cholecystectomy    . Tubal ligation      Bilateral  . Insert / replace / remove pacemaker    . Abdominal hysterectomy    . Biopsy stomach  11/03/10    Soft Tissue Mass, Biopsy, Right Lower  Quadrant Mesenteric Mass- High Grade Non-Hodgkins B Cell Lymphoma  with Flow Cytometry  . Bone biopsy  11/24/10    Bone Marrow, Aspirate Biospy, and clot, Left - No Involvement of Non-Hodgkin's Lymphoma Identified    Current Outpatient Prescriptions  Medication Sig Dispense Refill  . albuterol (PROVENTIL) (2.5 MG/3ML) 0.083% nebulizer solution Take 2.5 mg by nebulization every 6 (six) hours as needed.      Marland Kitchen allopurinol (ZYLOPRIM) 300 MG tablet Take 300 mg by mouth daily.      . Cholecalciferol (VITAMIN D3) 2000 UNITS TABS Take 2,000 Units by mouth 2 (two) times daily.       . citalopram (CELEXA) 20 MG tablet Take 20 mg by mouth every morning.       Marland Kitchen dexamethasone (DECADRON) 4 MG tablet Take 10 tablets (40 mg total) by mouth daily.  30 tablet  2  . diltiazem (CARDIZEM CD) 180 MG 24 hr capsule Take 180 mg by mouth every morning.       Marland Kitchen levothyroxine (SYNTHROID, LEVOTHROID) 100 MCG tablet Take 100 mcg by mouth every morning.       . lidocaine-prilocaine (EMLA) cream Apply topically as needed. Apply one hour before procedure as directed.  30 g  0  . loperamide (IMODIUM) 2 MG capsule Take 2 mg by mouth 4 (four) times daily as needed for diarrhea or loose stools.      Marland Kitchen  metFORMIN (GLUCOPHAGE) 500 MG tablet Take 1,000 mg by mouth 2 (two) times daily.       . ondansetron (ZOFRAN) 8 MG tablet Take 1 tablet (8 mg total) by mouth every 12 (twelve) hours as needed for nausea.  20 tablet  3  . pravastatin (PRAVACHOL) 40 MG tablet Take 40 mg by mouth at bedtime.       . prochlorperazine (COMPAZINE) 10 MG tablet Take 1 tablet (10 mg total) by mouth every 6 (six) hours as needed.  30 tablet  3  . promethazine (PHENERGAN) 25 MG suppository Place 1 suppository (25 mg total) rectally every 12 (twelve) hours as needed for nausea.  12 each  0  . warfarin (COUMADIN) 6 MG tablet Take 6 mg by mouth daily. 6 mg on Mon, Wed, Fri, Sat and Sundays.  3 mg on Tues and Thurs       No current facility-administered  medications for this visit.   Facility-Administered Medications Ordered in Other Visits  Medication Dose Route Frequency Provider Last Rate Last Dose  . sodium chloride 0.9 % injection 10 mL  10 mL Intracatheter PRN Exie Parody, MD   10 mL at 10/26/12 2000    ALLERGIES:  is allergic to codeine.  REVIEW OF SYSTEMS:  The rest of the 14-point review of system was negative.   Filed Vitals:   11/02/12 1328  BP: 116/63  Pulse: 67  Temp: 98.1 F (36.7 C)  Resp: 18   Wt Readings from Last 3 Encounters:  11/02/12 230 lb 8 oz (104.554 kg)  10/25/12 235 lb 8 oz (106.822 kg)  10/13/12 232 lb 11.2 oz (105.552 kg)   ECOG Performance status: 2 due to obesity, osteoarthritis.   PHYSICAL EXAMINATION:  General:  Obese woman, in wheelchair, in no acute distress.  Eyes:  no scleral icterus.  ENT:  There were no oropharyngeal lesions.  Neck was without thyromegaly.  Lymphatics:  Negative cervical, supraclavicular or axillary adenopathy.  Respiratory: lungs were clear bilaterally without wheezing or crackles.  Cardiovascular:  Regular rate and rhythm, S1/S2, without murmur, rub or gallop.  There was no pedal edema.  GI:  abdomen was soft, obese, nontender, nondistended, without organomegaly.  Muscoloskeletal:  no spinal tenderness of palpation of vertebral spine.  Skin exam was without echymosis, petichae.     LABORATORY/RADIOLOGY DATA:  Lab Results  Component Value Date   WBC 4.2 11/02/2012   HGB 13.2 11/02/2012   HCT 39.5 11/02/2012   PLT 197 11/02/2012   GLUCOSE 164* 10/25/2012   ALKPHOS 64 10/25/2012   ALT 16 10/25/2012   AST 19 10/25/2012   NA 140 10/25/2012   K 3.9 10/25/2012   CL 102 09/21/2012   CREATININE 1.6* 10/25/2012   BUN 16.8 10/25/2012   CO2 26 10/25/2012   INR 1.01 10/03/2012    ASSESSMENT AND PLAN:    1. Recurrent high grade NHL:  -  Diagnosis:  Relapsed high grade non-Hodgkin's B cell lymphoma. -  Recommendation:  *  Proceed with salvage chemo: R-GPD (Rituxan day1; Gemzar day  1 and 8; Cisplatin day 1; and Dexamethasone IV day 1 and oral days 2, 3, and 4).  Neulasta on day 9 to decrease risk of infection. She will proceed with cycle 1 day 8 today without dose modification.  *  After about 2-3 cycles, we will repeat scan to assess response.   *  If and when remission is achieve, we can proceed with autologous bone marrow transplant at [facility] with  Dr. Loistine Chance.    -  Follow up: In 2 weeks prior to cycle 2.   2. Hypothyroidism: She has levothyroxine per PCP. 3. Hypertension: She is on diltiazem PCP.  4. Diabetes mellitus, type II: She is on metformin per PCP.  5. Hyperlipidemia: She is on pravastatin per PCP.  6. History of atrial fibrillation: She is on diltiazem, and she has a pacemaker. She is on Coumadin being managed per her PCP.     The length of time of the face-to-face encounter was 30 minutes. More than 50% of time was spent counseling and coordination of care.

## 2012-11-02 NOTE — Patient Instructions (Addendum)
[facility] Discharge Instructions for Patients Receiving Chemotherapy  Today you received the following chemotherapy agents Gemzar.  To help prevent nausea and vomiting after your treatment, we encourage you to take your nausea medication as prescribed.   If you develop nausea and vomiting that is not controlled by your nausea medication, call the clinic.   BELOW ARE SYMPTOMS THAT SHOULD BE REPORTED IMMEDIATELY:  *FEVER GREATER THAN 100.5 F  *CHILLS WITH OR WITHOUT FEVER  NAUSEA AND VOMITING THAT IS NOT CONTROLLED WITH YOUR NAUSEA MEDICATION  *UNUSUAL SHORTNESS OF BREATH  *UNUSUAL BRUISING OR BLEEDING  TENDERNESS IN MOUTH AND THROAT WITH OR WITHOUT PRESENCE OF ULCERS  *URINARY PROBLEMS  *BOWEL PROBLEMS  UNUSUAL RASH Items with * indicate a potential emergency and should be followed up as soon as possible.  Feel free to call the clinic you have any questions or concerns. The clinic phone number is [phone].    

## 2012-11-02 NOTE — Telephone Encounter (Signed)
gv and printed avs for pt °

## 2012-11-03 ENCOUNTER — Ambulatory Visit (HOSPITAL_BASED_OUTPATIENT_CLINIC_OR_DEPARTMENT_OTHER): Payer: Medicare Other

## 2012-11-03 VITALS — BP 96/56 | HR 72 | Temp 98.1°F

## 2012-11-03 DIAGNOSIS — C8583 Other specified types of non-Hodgkin lymphoma, intra-abdominal lymph nodes: Secondary | ICD-10-CM

## 2012-11-03 DIAGNOSIS — C8330 Diffuse large B-cell lymphoma, unspecified site: Secondary | ICD-10-CM

## 2012-11-03 DIAGNOSIS — C8590 Non-Hodgkin lymphoma, unspecified, unspecified site: Secondary | ICD-10-CM

## 2012-11-03 MED ORDER — PEGFILGRASTIM INJECTION 6 MG/0.6ML
6.0000 mg | Freq: Once | SUBCUTANEOUS | Status: AC
  Administered 2012-11-03: 6 mg via SUBCUTANEOUS
  Filled 2012-11-03: qty 0.6

## 2012-11-09 ENCOUNTER — Telehealth: Payer: Self-pay | Admitting: *Deleted

## 2012-11-09 NOTE — Telephone Encounter (Signed)
Call to Dr Vear Clock at number given. Unable to leave a message. I have spoken with his secretary, Marchelle Folks, and have given my contact information for Dr Vear Clock to return my call.

## 2012-11-09 NOTE — Telephone Encounter (Signed)
VM from Dr. Roney Jaffe at [facility] requesting update on pt's status.

## 2012-11-10 ENCOUNTER — Encounter: Payer: Self-pay | Admitting: *Deleted

## 2012-11-15 ENCOUNTER — Ambulatory Visit (HOSPITAL_BASED_OUTPATIENT_CLINIC_OR_DEPARTMENT_OTHER): Payer: Medicare Other

## 2012-11-15 ENCOUNTER — Other Ambulatory Visit (HOSPITAL_BASED_OUTPATIENT_CLINIC_OR_DEPARTMENT_OTHER): Payer: Medicare Other

## 2012-11-15 ENCOUNTER — Other Ambulatory Visit: Payer: Medicare Other | Admitting: Lab

## 2012-11-15 ENCOUNTER — Encounter: Payer: Medicare Other | Admitting: Internal Medicine

## 2012-11-15 ENCOUNTER — Ambulatory Visit (HOSPITAL_BASED_OUTPATIENT_CLINIC_OR_DEPARTMENT_OTHER): Payer: Medicare Other | Admitting: Hematology and Oncology

## 2012-11-15 VITALS — BP 112/67 | HR 97 | Temp 100.0°F | Resp 20 | Ht <= 58 in | Wt 233.3 lb

## 2012-11-15 DIAGNOSIS — I4891 Unspecified atrial fibrillation: Secondary | ICD-10-CM

## 2012-11-15 DIAGNOSIS — Z5189 Encounter for other specified aftercare: Secondary | ICD-10-CM

## 2012-11-15 DIAGNOSIS — C8583 Other specified types of non-Hodgkin lymphoma, intra-abdominal lymph nodes: Secondary | ICD-10-CM

## 2012-11-15 DIAGNOSIS — C8590 Non-Hodgkin lymphoma, unspecified, unspecified site: Secondary | ICD-10-CM

## 2012-11-15 DIAGNOSIS — C8330 Diffuse large B-cell lymphoma, unspecified site: Secondary | ICD-10-CM

## 2012-11-15 DIAGNOSIS — I10 Essential (primary) hypertension: Secondary | ICD-10-CM

## 2012-11-15 DIAGNOSIS — Z5111 Encounter for antineoplastic chemotherapy: Secondary | ICD-10-CM

## 2012-11-15 DIAGNOSIS — E119 Type 2 diabetes mellitus without complications: Secondary | ICD-10-CM

## 2012-11-15 LAB — COMPREHENSIVE METABOLIC PANEL (CC13)
ALT: 24 U/L (ref 0–55)
AST: 28 U/L (ref 5–34)
Alkaline Phosphatase: 74 U/L (ref 40–150)
Calcium: 9 mg/dL (ref 8.4–10.4)
Chloride: 107 mEq/L (ref 98–109)
Creatinine: 1.1 mg/dL (ref 0.6–1.1)
Potassium: 3.9 mEq/L (ref 3.5–5.1)

## 2012-11-15 LAB — CBC WITH DIFFERENTIAL/PLATELET
BASO%: 1.6 % (ref 0.0–2.0)
EOS%: 0.6 % (ref 0.0–7.0)
LYMPH%: 11.3 % — ABNORMAL LOW (ref 14.0–49.7)
MCH: 29.5 pg (ref 25.1–34.0)
MCHC: 31.8 g/dL (ref 31.5–36.0)
MONO#: 2 10*3/uL — ABNORMAL HIGH (ref 0.1–0.9)
RBC: 3.66 10*6/uL — ABNORMAL LOW (ref 3.70–5.45)
WBC: 13.4 10*3/uL — ABNORMAL HIGH (ref 3.9–10.3)
lymph#: 1.5 10*3/uL (ref 0.9–3.3)
nRBC: 1 % — ABNORMAL HIGH (ref 0–0)

## 2012-11-15 MED ORDER — DEXAMETHASONE SODIUM PHOSPHATE 20 MG/5ML IJ SOLN
12.0000 mg | Freq: Once | INTRAMUSCULAR | Status: AC
  Administered 2012-11-15: 12 mg via INTRAVENOUS

## 2012-11-15 MED ORDER — HEPARIN SOD (PORK) LOCK FLUSH 100 UNIT/ML IV SOLN
500.0000 [IU] | Freq: Once | INTRAVENOUS | Status: AC | PRN
  Administered 2012-11-15: 500 [IU]
  Filled 2012-11-15: qty 5

## 2012-11-15 MED ORDER — PALONOSETRON HCL INJECTION 0.25 MG/5ML
0.2500 mg | Freq: Once | INTRAVENOUS | Status: AC
  Administered 2012-11-15: 0.25 mg via INTRAVENOUS

## 2012-11-15 MED ORDER — SODIUM CHLORIDE 0.9 % IJ SOLN
10.0000 mL | INTRAMUSCULAR | Status: DC | PRN
Start: 2012-11-15 — End: 2012-11-15
  Administered 2012-11-15: 10 mL
  Filled 2012-11-15: qty 10

## 2012-11-15 MED ORDER — POTASSIUM CHLORIDE 2 MEQ/ML IV SOLN
Freq: Once | INTRAVENOUS | Status: AC
  Administered 2012-11-15: 12:00:00 via INTRAVENOUS
  Filled 2012-11-15: qty 10

## 2012-11-15 MED ORDER — SODIUM CHLORIDE 0.9 % IV SOLN
150.0000 mg | Freq: Once | INTRAVENOUS | Status: AC
  Administered 2012-11-15: 150 mg via INTRAVENOUS
  Filled 2012-11-15: qty 5

## 2012-11-15 MED ORDER — SODIUM CHLORIDE 0.9 % IV SOLN
Freq: Once | INTRAVENOUS | Status: AC
  Administered 2012-11-15: 11:00:00 via INTRAVENOUS

## 2012-11-15 MED ORDER — SODIUM CHLORIDE 0.9 % IV SOLN
75.0000 mg/m2 | Freq: Once | INTRAVENOUS | Status: AC
  Administered 2012-11-15: 153 mg via INTRAVENOUS
  Filled 2012-11-15: qty 153

## 2012-11-15 MED ORDER — SODIUM CHLORIDE 0.9 % IV SOLN
750.0000 mg/m2 | Freq: Once | INTRAVENOUS | Status: AC
  Administered 2012-11-15: 1520 mg via INTRAVENOUS
  Filled 2012-11-15: qty 40

## 2012-11-15 NOTE — Patient Instructions (Addendum)
[facility] Discharge Instructions for Patients Receiving Chemotherapy  Today you received the following chemotherapy agents: Gemzar and Cisplatin.  To help prevent nausea and vomiting after your treatment, we encourage you to take your nausea medication as prescribed.   If you develop nausea and vomiting that is not controlled by your nausea medication, call the clinic.   BELOW ARE SYMPTOMS THAT SHOULD BE REPORTED IMMEDIATELY:  *FEVER GREATER THAN 100.5 F  *CHILLS WITH OR WITHOUT FEVER  NAUSEA AND VOMITING THAT IS NOT CONTROLLED WITH YOUR NAUSEA MEDICATION  *UNUSUAL SHORTNESS OF BREATH  *UNUSUAL BRUISING OR BLEEDING  TENDERNESS IN MOUTH AND THROAT WITH OR WITHOUT PRESENCE OF ULCERS  *URINARY PROBLEMS  *BOWEL PROBLEMS  UNUSUAL RASH Items with * indicate a potential emergency and should be followed up as soon as possible.  Feel free to call the clinic you have any questions or concerns. The clinic phone number is [phone].    

## 2012-11-15 NOTE — Progress Notes (Signed)
Pre Cisplatin void was 850 mls.

## 2012-11-16 ENCOUNTER — Ambulatory Visit (HOSPITAL_BASED_OUTPATIENT_CLINIC_OR_DEPARTMENT_OTHER): Payer: Medicare Other

## 2012-11-16 ENCOUNTER — Other Ambulatory Visit: Payer: Self-pay

## 2012-11-16 VITALS — BP 104/58 | HR 88 | Temp 97.6°F | Resp 18 | Ht <= 58 in | Wt 233.0 lb

## 2012-11-16 DIAGNOSIS — Z5112 Encounter for antineoplastic immunotherapy: Secondary | ICD-10-CM

## 2012-11-16 DIAGNOSIS — C8590 Non-Hodgkin lymphoma, unspecified, unspecified site: Secondary | ICD-10-CM

## 2012-11-16 DIAGNOSIS — C8589 Other specified types of non-Hodgkin lymphoma, extranodal and solid organ sites: Secondary | ICD-10-CM

## 2012-11-16 DIAGNOSIS — C8330 Diffuse large B-cell lymphoma, unspecified site: Secondary | ICD-10-CM

## 2012-11-16 MED ORDER — SODIUM CHLORIDE 0.9 % IV SOLN
Freq: Once | INTRAVENOUS | Status: AC
  Administered 2012-11-16: 10:00:00 via INTRAVENOUS

## 2012-11-16 MED ORDER — SODIUM CHLORIDE 0.9 % IV SOLN
800.0000 mg | Freq: Once | INTRAVENOUS | Status: AC
  Administered 2012-11-16: 800 mg via INTRAVENOUS
  Filled 2012-11-16: qty 80

## 2012-11-16 MED ORDER — HEPARIN SOD (PORK) LOCK FLUSH 100 UNIT/ML IV SOLN
500.0000 [IU] | Freq: Once | INTRAVENOUS | Status: AC | PRN
  Administered 2012-11-16: 500 [IU]
  Filled 2012-11-16: qty 5

## 2012-11-16 MED ORDER — SODIUM CHLORIDE 0.9 % IV SOLN: 375.0000 mg/m2 | Freq: Once | INTRAVENOUS | Status: DC

## 2012-11-16 MED ORDER — DIPHENHYDRAMINE HCL 25 MG PO CAPS
50.0000 mg | ORAL_CAPSULE | Freq: Once | ORAL | Status: AC
  Administered 2012-11-16: 50 mg via ORAL

## 2012-11-16 MED ORDER — ACETAMINOPHEN 325 MG PO TABS
650.0000 mg | ORAL_TABLET | Freq: Once | ORAL | Status: AC
  Administered 2012-11-16: 650 mg via ORAL

## 2012-11-16 MED ORDER — SODIUM CHLORIDE 0.9 % IJ SOLN
10.0000 mL | INTRAMUSCULAR | Status: DC | PRN
  Administered 2012-11-16: 10 mL
  Filled 2012-11-16: qty 10

## 2012-11-16 NOTE — Patient Instructions (Addendum)

## 2012-11-18 ENCOUNTER — Ambulatory Visit: Payer: Medicare Other

## 2012-11-18 ENCOUNTER — Ambulatory Visit (HOSPITAL_BASED_OUTPATIENT_CLINIC_OR_DEPARTMENT_OTHER): Payer: Medicare Other

## 2012-11-18 ENCOUNTER — Ambulatory Visit (HOSPITAL_BASED_OUTPATIENT_CLINIC_OR_DEPARTMENT_OTHER): Payer: Medicare Other | Admitting: Hematology and Oncology

## 2012-11-18 ENCOUNTER — Other Ambulatory Visit (HOSPITAL_BASED_OUTPATIENT_CLINIC_OR_DEPARTMENT_OTHER): Payer: Medicare Other | Admitting: Lab

## 2012-11-18 VITALS — BP 157/78 | HR 84 | Temp 97.7°F | Resp 20 | Ht <= 58 in | Wt 236.3 lb

## 2012-11-18 DIAGNOSIS — C8583 Other specified types of non-Hodgkin lymphoma, intra-abdominal lymph nodes: Secondary | ICD-10-CM

## 2012-11-18 DIAGNOSIS — E876 Hypokalemia: Secondary | ICD-10-CM

## 2012-11-18 DIAGNOSIS — C8590 Non-Hodgkin lymphoma, unspecified, unspecified site: Secondary | ICD-10-CM

## 2012-11-18 DIAGNOSIS — Z5189 Encounter for other specified aftercare: Secondary | ICD-10-CM

## 2012-11-18 DIAGNOSIS — E119 Type 2 diabetes mellitus without complications: Secondary | ICD-10-CM

## 2012-11-18 DIAGNOSIS — I10 Essential (primary) hypertension: Secondary | ICD-10-CM

## 2012-11-18 LAB — BASIC METABOLIC PANEL (CC13)
BUN: 28.7 mg/dL — ABNORMAL HIGH (ref 7.0–26.0)
Calcium: 8.6 mg/dL (ref 8.4–10.4)
Creatinine: 1.6 mg/dL — ABNORMAL HIGH (ref 0.6–1.1)

## 2012-11-18 LAB — MAGNESIUM (CC13): Magnesium: 1.5 mg/dl (ref 1.5–2.5)

## 2012-11-18 MED ORDER — SODIUM CHLORIDE 0.9 % IV SOLN: INTRAVENOUS | Status: DC

## 2012-11-18 MED ORDER — SODIUM CHLORIDE 0.9 % IV SOLN
2.0000 g | Freq: Once | INTRAVENOUS | Status: AC
  Administered 2012-11-18: 2 g via INTRAVENOUS
  Filled 2012-11-18: qty 4

## 2012-11-18 MED ORDER — SODIUM CHLORIDE 0.9 % IV SOLN: Freq: Once | INTRAVENOUS | Status: DC

## 2012-11-18 MED ORDER — SODIUM CHLORIDE 0.9 % IJ SOLN
10.0000 mL | INTRAMUSCULAR | Status: DC | PRN
  Administered 2012-11-18: 10 mL via INTRAVENOUS
  Filled 2012-11-18: qty 10

## 2012-11-18 MED ORDER — HEPARIN SOD (PORK) LOCK FLUSH 100 UNIT/ML IV SOLN
500.0000 [IU] | Freq: Once | INTRAVENOUS | Status: AC
  Administered 2012-11-18: 500 [IU] via INTRAVENOUS
  Filled 2012-11-18: qty 5

## 2012-11-18 NOTE — Patient Instructions (Signed)
Hypomagnesemia Magnesium is a common ion (mineral) in the body which is needed for metabolism. It is about how the body handles food and other chemical reactions necessary for life. Only about 2% of the magnesium in our body is found in the blood. When this is low, it is called hypomagnesemia. The blood will measure only a tiny amount of the magnesium in our body. When it is low in our blood, it does not mean that the whole body supply is low. The normal serum concentration ranges from 1.8-2.5 mEq/L. When the level gets to be less than 1.0 mEq/L, a number of problems begin to happen.  CAUSES   Receiving intravenous fluids without magnesium replacement.  Loss of magnesium from the bowel by naso-gastric suction.  Loss of magnesium from nausea and vomiting or severe diarrhea. Any of the inflammatory bowel conditions can cause this.  Abuse of alcohol often leads to low serum magnesium.  An inherited form of magnesium loss happens when the kidneys lose magnesium. This is called familial or primary hypomagnesemia.  Some medications such as diuretics also cause the loss of magnesium. SYMPTOMS  These following problems are worse if the changes in magnesium levels come on suddenly.  Tremor.  Confusion.  Muscle weakness.  Over-sensitive to sights and sounds.  Sensitive reflexes.  Depression.  Muscular fibrillations.  Over-reactivity of the nerves.  Irritability.  Psychosis.  Spasms of the hand muscles.  Tetany (where the muscles go into uncontrollable spasms). DIAGNOSIS  This condition can be diagnosed by blood tests. TREATMENT   In emergency, magnesium can be given intravenously (by vein).  If the condition is less worrisome, it can be corrected by diet. High levels of magnesium are found in green leafy vegetables, peas, beans and nuts among other things. It can also be given through medications by mouth.  If it is being caused by medications, changes can be made.  If  alcohol is a problem, help is available if there are difficulties giving it up. Document Released: 12/24/2004 Document Revised: 06/22/2011 Document Reviewed: 11/18/2007 Exit[employer] Patient Information 2014 [employer].        

## 2012-11-19 ENCOUNTER — Ambulatory Visit (HOSPITAL_BASED_OUTPATIENT_CLINIC_OR_DEPARTMENT_OTHER): Payer: Medicare Other

## 2012-11-19 DIAGNOSIS — Z5189 Encounter for other specified aftercare: Secondary | ICD-10-CM

## 2012-11-19 DIAGNOSIS — C8589 Other specified types of non-Hodgkin lymphoma, extranodal and solid organ sites: Secondary | ICD-10-CM

## 2012-11-19 MED ORDER — HEPARIN SOD (PORK) LOCK FLUSH 100 UNIT/ML IV SOLN
500.0000 [IU] | Freq: Once | INTRAVENOUS | Status: AC
  Administered 2012-11-19: 500 [IU] via INTRAVENOUS
  Filled 2012-11-19: qty 5

## 2012-11-19 MED ORDER — SODIUM CHLORIDE 0.9 % IV SOLN
INTRAVENOUS | Status: AC
  Administered 2012-11-19: 10:00:00 via INTRAVENOUS

## 2012-11-19 MED ORDER — SODIUM CHLORIDE 0.9 % IJ SOLN
10.0000 mL | INTRAMUSCULAR | Status: DC | PRN
  Administered 2012-11-19: 10 mL via INTRAVENOUS
  Filled 2012-11-19: qty 10

## 2012-11-19 NOTE — Patient Instructions (Addendum)
Dehydration, Adult  Dehydration means your body does not have as much fluid as it needs. Your kidneys, brain, and heart will not work properly without the right amount of fluids and salt.   HOME CARE   Ask your doctor how to replace body fluid losses (rehydrate).   Drink enough fluids to keep your pee (urine) clear or pale yellow.   Drink small amounts of fluids often if you feel sick to your stomach (nauseous) or throw up (vomit).   Eat like you normally do.   Avoid:   Foods or drinks high in sugar.   Bubbly (carbonated) drinks.   Juice.   Very hot or cold fluids.   Drinks with caffeine.   Fatty, greasy foods.   Alcohol.   Tobacco.   Eating too much.   Gelatin desserts.   Wash your hands to avoid spreading germs (bacteria, viruses).   Only take medicine as told by your doctor.   Keep all doctor visits as told.  GET HELP RIGHT AWAY IF:    You cannot drink something without throwing up.   You get worse even with treatment.   Your vomit has blood in it or looks greenish.   Your poop (stool) has blood in it or looks black and tarry.   You have not peed in 6 to 8 hours.   You pee a small amount of very dark pee.   You have a fever.   You pass out (faint).   You have belly (abdominal) pain that gets worse or stays in one spot (localizes).   You have a rash, stiff neck, or bad headache.   You get easily annoyed, sleepy, or are hard to wake up.   You feel weak, dizzy, or very thirsty.  MAKE SURE YOU:    Understand these instructions.   Will watch your condition.   Will get help right away if you are not doing well or get worse.  Document Released: 01/24/2009 Document Revised: 06/22/2011 Document Reviewed: 11/17/2010  Exit[employer] Patient Information 2014 [employer].

## 2012-11-22 ENCOUNTER — Ambulatory Visit (HOSPITAL_BASED_OUTPATIENT_CLINIC_OR_DEPARTMENT_OTHER): Payer: Medicare Other

## 2012-11-22 ENCOUNTER — Other Ambulatory Visit (HOSPITAL_BASED_OUTPATIENT_CLINIC_OR_DEPARTMENT_OTHER): Payer: Medicare Other | Admitting: Lab

## 2012-11-22 ENCOUNTER — Other Ambulatory Visit: Payer: Self-pay | Admitting: Hematology and Oncology

## 2012-11-22 VITALS — BP 107/72 | HR 97 | Temp 97.8°F | Resp 20

## 2012-11-22 DIAGNOSIS — C8589 Other specified types of non-Hodgkin lymphoma, extranodal and solid organ sites: Secondary | ICD-10-CM

## 2012-11-22 DIAGNOSIS — C8590 Non-Hodgkin lymphoma, unspecified, unspecified site: Secondary | ICD-10-CM

## 2012-11-22 DIAGNOSIS — Z5111 Encounter for antineoplastic chemotherapy: Secondary | ICD-10-CM

## 2012-11-22 DIAGNOSIS — Z5189 Encounter for other specified aftercare: Secondary | ICD-10-CM

## 2012-11-22 DIAGNOSIS — E876 Hypokalemia: Secondary | ICD-10-CM

## 2012-11-22 DIAGNOSIS — C8330 Diffuse large B-cell lymphoma, unspecified site: Secondary | ICD-10-CM

## 2012-11-22 LAB — CBC WITH DIFFERENTIAL/PLATELET
HCT: 35.4 % (ref 34.8–46.6)
MCH: 29.3 pg (ref 25.1–34.0)
MCHC: 33.1 g/dL (ref 31.5–36.0)
MCV: 88.7 fL (ref 79.5–101.0)
MONO%: 8.2 % (ref 0.0–14.0)
NEUT%: 62.7 % (ref 38.4–76.8)
Platelets: 143 10*3/uL — ABNORMAL LOW (ref 145–400)
nRBC: 0 % (ref 0–0)

## 2012-11-22 LAB — BASIC METABOLIC PANEL (CC13)
BUN: 27.3 mg/dL — ABNORMAL HIGH (ref 7.0–26.0)
Calcium: 8.9 mg/dL (ref 8.4–10.4)
Glucose: 144 mg/dl — ABNORMAL HIGH (ref 70–140)
Potassium: 3.2 mEq/L — ABNORMAL LOW (ref 3.5–5.1)
Sodium: 142 mEq/L (ref 136–145)

## 2012-11-22 LAB — MAGNESIUM (CC13): Magnesium: 0.9 mg/dl — CL (ref 1.5–2.5)

## 2012-11-22 MED ORDER — SODIUM CHLORIDE 0.9 % IV SOLN
Freq: Once | INTRAVENOUS | Status: AC
  Administered 2012-11-22: 12:00:00 via INTRAVENOUS
  Filled 2012-11-22: qty 250

## 2012-11-22 MED ORDER — SODIUM CHLORIDE 0.9 % IJ SOLN
10.0000 mL | INTRAMUSCULAR | Status: DC | PRN
  Administered 2012-11-22: 10 mL
  Filled 2012-11-22: qty 10

## 2012-11-22 MED ORDER — SODIUM CHLORIDE 0.9 % IV SOLN
1000.0000 mg/m2 | Freq: Once | INTRAVENOUS | Status: AC
  Administered 2012-11-22: 2052 mg via INTRAVENOUS
  Filled 2012-11-22: qty 54

## 2012-11-22 MED ORDER — LIDOCAINE-PRILOCAINE 2.5-2.5 % EX CREA: TOPICAL_CREAM | CUTANEOUS | Status: DC | PRN

## 2012-11-22 MED ORDER — POTASSIUM CHLORIDE 10 MEQ/100ML IV SOLN
10.0000 meq | INTRAVENOUS | Status: DC
  Filled 2012-11-22: qty 100

## 2012-11-22 MED ORDER — HEPARIN SOD (PORK) LOCK FLUSH 100 UNIT/ML IV SOLN
500.0000 [IU] | Freq: Once | INTRAVENOUS | Status: AC | PRN
  Administered 2012-11-22: 500 [IU]
  Filled 2012-11-22: qty 5

## 2012-11-22 MED ORDER — PROCHLORPERAZINE MALEATE 10 MG PO TABS
10.0000 mg | ORAL_TABLET | Freq: Once | ORAL | Status: AC
  Administered 2012-11-22: 10 mg via ORAL

## 2012-11-22 MED ORDER — SODIUM CHLORIDE 0.9 % IV SOLN
Freq: Once | INTRAVENOUS | Status: AC
  Administered 2012-11-22: 10:00:00 via INTRAVENOUS

## 2012-11-22 MED ORDER — SODIUM CHLORIDE 0.9 % IV SOLN
INTRAVENOUS | Status: DC
  Administered 2012-11-22: 12:00:00 via INTRAVENOUS
  Filled 2012-11-22: qty 500

## 2012-11-22 MED ORDER — SODIUM CHLORIDE 0.9 % IV SOLN
6.0000 g | Freq: Once | INTRAVENOUS | Status: DC
  Filled 2012-11-22: qty 12

## 2012-11-22 MED ORDER — POTASSIUM CHLORIDE ER 10 MEQ PO TBCR: 20.0000 meq | EXTENDED_RELEASE_TABLET | Freq: Once | ORAL | Status: DC

## 2012-11-22 NOTE — Patient Instructions (Addendum)
[facility] Discharge Instructions for Patients Receiving Chemotherapy  Today you received the following chemotherapy agents :  Gemzar.  To help prevent nausea and vomiting after your treatment, we encourage you to take your nausea medication as instructed by your physician.   If you develop nausea and vomiting that is not controlled by your nausea medication, call the clinic.   BELOW ARE SYMPTOMS THAT SHOULD BE REPORTED IMMEDIATELY:  *FEVER GREATER THAN 100.5 F  *CHILLS WITH OR WITHOUT FEVER  NAUSEA AND VOMITING THAT IS NOT CONTROLLED WITH YOUR NAUSEA MEDICATION  *UNUSUAL SHORTNESS OF BREATH  *UNUSUAL BRUISING OR BLEEDING  TENDERNESS IN MOUTH AND THROAT WITH OR WITHOUT PRESENCE OF ULCERS  *URINARY PROBLEMS  *BOWEL PROBLEMS  UNUSUAL RASH Items with * indicate a potential emergency and should be followed up as soon as possible.  Feel free to call the clinic you have any questions or concerns. The clinic phone number is [phone].   Hypomagnesemia Magnesium is a common ion (mineral) in the body which is needed for metabolism. It is about how the body handles food and other chemical reactions necessary for life. Only about 2% of the magnesium in our body is found in the blood. When this is low, it is called hypomagnesemia. The blood will measure only a tiny amount of the magnesium in our body. When it is low in our blood, it does not mean that the whole body supply is low. The normal serum concentration ranges from 1.8-2.5 mEq/L. When the level gets to be less than 1.0 mEq/L, a number of problems begin to happen.  CAUSES   Receiving intravenous fluids without magnesium replacement.  Loss of magnesium from the bowel by naso-gastric suction.  Loss of magnesium from nausea and vomiting or severe diarrhea. Any of the inflammatory bowel conditions can cause this.  Abuse of alcohol often leads to low serum magnesium.  An inherited form of magnesium loss happens  when the kidneys lose magnesium. This is called familial or primary hypomagnesemia.  Some medications such as diuretics also cause the loss of magnesium. SYMPTOMS  These following problems are worse if the changes in magnesium levels come on suddenly.  Tremor.  Confusion.  Muscle weakness.  Over-sensitive to sights and sounds.  Sensitive reflexes.  Depression.  Muscular fibrillations.  Over-reactivity of the nerves.  Irritability.  Psychosis.  Spasms of the hand muscles.  Tetany (where the muscles go into uncontrollable spasms). DIAGNOSIS  This condition can be diagnosed by blood tests. TREATMENT   In emergency, magnesium can be given intravenously (by vein).  If the condition is less worrisome, it can be corrected by diet. High levels of magnesium are found in green leafy vegetables, peas, beans and nuts among other things. It can also be given through medications by mouth.  If it is being caused by medications, changes can be made.  If alcohol is a problem, help is available if there are difficulties giving it up. Document Released: 12/24/2004 Document Revised: 06/22/2011 Document Reviewed: 11/18/2007 [facility] Patient Information 2014 [place], [place].

## 2012-11-22 NOTE — Progress Notes (Signed)
Dr. Roanna Raider saw pt in infusion room today.  Proceed with chemo as ordered per md.

## 2012-11-23 ENCOUNTER — Ambulatory Visit (HOSPITAL_BASED_OUTPATIENT_CLINIC_OR_DEPARTMENT_OTHER): Payer: Medicare Other

## 2012-11-23 VITALS — BP 127/51 | HR 92 | Temp 98.5°F

## 2012-11-23 DIAGNOSIS — C8583 Other specified types of non-Hodgkin lymphoma, intra-abdominal lymph nodes: Secondary | ICD-10-CM

## 2012-11-23 DIAGNOSIS — C8590 Non-Hodgkin lymphoma, unspecified, unspecified site: Secondary | ICD-10-CM

## 2012-11-23 DIAGNOSIS — C8330 Diffuse large B-cell lymphoma, unspecified site: Secondary | ICD-10-CM

## 2012-11-23 MED ORDER — PEGFILGRASTIM INJECTION 6 MG/0.6ML
6.0000 mg | Freq: Once | SUBCUTANEOUS | Status: AC
  Administered 2012-11-23: 6 mg via SUBCUTANEOUS
  Filled 2012-11-23: qty 0.6

## 2012-11-24 ENCOUNTER — Ambulatory Visit: Payer: Medicare Other

## 2012-11-28 ENCOUNTER — Telehealth: Payer: Self-pay | Admitting: Hematology and Oncology

## 2012-11-28 ENCOUNTER — Other Ambulatory Visit: Payer: Self-pay | Admitting: *Deleted

## 2012-11-28 NOTE — Telephone Encounter (Signed)
Pt called and wants an appt for f/u with MD, no order, notified Cameo,RN

## 2012-11-29 ENCOUNTER — Other Ambulatory Visit: Payer: Medicare Other | Admitting: Lab

## 2012-11-29 ENCOUNTER — Other Ambulatory Visit: Payer: Self-pay | Admitting: *Deleted

## 2012-11-29 ENCOUNTER — Telehealth: Payer: Self-pay | Admitting: *Deleted

## 2012-11-29 DIAGNOSIS — C8589 Other specified types of non-Hodgkin lymphoma, extranodal and solid organ sites: Secondary | ICD-10-CM

## 2012-11-29 MED ORDER — LIDOCAINE-PRILOCAINE 2.5-2.5 % EX CREA: TOPICAL_CREAM | CUTANEOUS | Status: DC | PRN

## 2012-11-29 NOTE — Telephone Encounter (Signed)
Opened in error

## 2012-12-04 NOTE — Progress Notes (Signed)
This encounter was created in error - please disregard.

## 2012-12-05 ENCOUNTER — Other Ambulatory Visit: Payer: Self-pay | Admitting: *Deleted

## 2012-12-05 NOTE — Progress Notes (Signed)
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  PAST THERAPY: q3wk R-CHOP x6 cycles; finished in Dec 2012; followed by consolidative radiation therapy. She developed recurrent disease; started on bendamustine/Rituxan on 06/29/2012. He completed 3 cycles on 08/25/12. Stopped due to disease progression.   CURRENT THERAPY: She started salvage chemotherapy with R-GPD on 10/26/12   INTERVAL HISTORY:  Kayonna Lawniczak Conroy 72 y.o. female returns for regular follow up visit Her appetite is up and down Occasionally she has blurry vision. Patient reported tingling in her hand which is not new. The patient denied fever, chills, night sweats. She denied headaches, double vision, nasal congestion, nasal discharge, hearing problems, odynophagia or dysphagia. No chest pain, palpitations, dyspnea, cough, abdominal pain, nausea, vomiting, diarrhea, constipation, hematochezia. The patient denied dysuria, nocturia, polyuria, hematuria, myalgia, numbness, psychiatric problems.  Review of Systems  Constitutional: Negative for fever, chills, weight loss, malaise/fatigue and diaphoresis.  HENT: Negative for ear pain, nosebleeds, congestion, sore throat, neck pain and tinnitus.   Eyes: Positive for blurred vision. Negative for double vision, photophobia and pain.  Respiratory: Negative for cough, hemoptysis, sputum production, shortness of breath and wheezing.   Cardiovascular: Negative for chest pain, palpitations, orthopnea, claudication and leg swelling.  Gastrointestinal: Negative for heartburn, nausea, vomiting, abdominal pain, diarrhea, constipation, blood in stool and melena.  Genitourinary: Negative for dysuria, urgency, frequency and hematuria.  Musculoskeletal: Negative for myalgias, back pain and joint  pain.  Skin: Negative for itching and rash.  Neurological: Positive for tingling. Negative for tremors, focal weakness, seizures, loss of consciousness, weakness and headaches.  Psychiatric/Behavioral: Negative.     PAST MEDICAL HISTORY: Past Medical History  Diagnosis Date  . Sick sinus syndrome   . Hematoma     At the site of the pacemaker insertion.  . Hypotension   . Dyslipidemia   . Diabetes mellitus   . Pacemaker   . History of atrial fibrillation   . Arthritis     Osteoarthritis  . Hypothyroidism   . Port-a-cath in place 12/08/10  . Status post chemotherapy completed 03/2011    R - CHOP q 3 weeks x 6  . S/P radiation therapy 05/28/2011 - 06/26/2011    Right Abdomen and Right Pelvis/3060 cGy in 17 Fractions  . Diffuse large B cell lymphoma   . Cancer 10/2010    large B cell lymphoma  . 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 Non-Hodgkin's lymphoma of abdomen 05/20/2011    PAST SURGICAL HISTORY: Past Surgical History  Procedure Laterality Date  . Pacemaker insertion      Lead revision completed August 04, 2006  . Cholecystectomy    . Tubal ligation      Bilateral  . Insert / replace / remove pacemaker    . Abdominal hysterectomy    . Biopsy stomach  11/03/10    Soft Tissue Mass, Biopsy, Right Lower Quadrant Mesenteric Mass- High Grade Non-Hodgkins B Cell Lymphoma  with Flow Cytometry  . Bone biopsy  11/24/10    Bone Marrow, Aspirate Biospy, and clot, Left - No Involvement of Non-Hodgkin's Lymphoma Identified    FAMILY HISTORY Family History  Problem Relation Age of Onset  . Stroke Mother   . Stroke Father   . Aneurysm Son     Brain  . Cancer Sister      1 sister  had vaginal cancer  . Cancer Brother     Prostate    HEALTH MAINTENANCE: History  Substance Use Topics  . Smoking status: Current Some Day Smoker -- 1.50 packs/day for 30 years    Types: Cigarettes  . Smokeless tobacco: Never Used  . Alcohol Use: No    Allergies  Allergen Reactions  . Codeine  Nausea Only    Current Outpatient Prescriptions  Medication Sig Dispense Refill  . albuterol (PROVENTIL) (2.5 MG/3ML) 0.083% nebulizer solution Take 2.5 mg by nebulization every 6 (six) hours as needed.      Marland Kitchen allopurinol (ZYLOPRIM) 300 MG tablet Take 300 mg by mouth daily.      . Cholecalciferol (VITAMIN D3) 2000 UNITS TABS Take 2,000 Units by mouth 2 (two) times daily.       . citalopram (CELEXA) 20 MG tablet Take 20 mg by mouth every morning.       Marland Kitchen dexamethasone (DECADRON) 4 MG tablet Take 10 tablets (40 mg total) by mouth daily.  30 tablet  2  . diltiazem (CARDIZEM CD) 180 MG 24 hr capsule Take 180 mg by mouth every morning.       Marland Kitchen levothyroxine (SYNTHROID, LEVOTHROID) 100 MCG tablet Take 100 mcg by mouth every morning.       . loperamide (IMODIUM) 2 MG capsule Take 2 mg by mouth 4 (four) times daily as needed for diarrhea or loose stools.      . metFORMIN (GLUCOPHAGE) 500 MG tablet Take 1,000 mg by mouth 2 (two) times daily.       . ondansetron (ZOFRAN) 8 MG tablet Take 1 tablet (8 mg total) by mouth every 12 (twelve) hours as needed for nausea.  20 tablet  3  . pravastatin (PRAVACHOL) 40 MG tablet Take 40 mg by mouth at bedtime.       . prochlorperazine (COMPAZINE) 10 MG tablet Take 1 tablet (10 mg total) by mouth every 6 (six) hours as needed.  30 tablet  3  . promethazine (PHENERGAN) 25 MG suppository Place 1 suppository (25 mg total) rectally every 12 (twelve) hours as needed for nausea.  12 each  0  . warfarin (COUMADIN) 6 MG tablet Take 6 mg by mouth daily. 6 mg on Mon, Wed, Fri, Sat and Sundays.  3 mg on Tues and Thurs      . lidocaine-prilocaine (EMLA) cream Apply topically as needed. Apply to port a cath site one hour before needle stick as needed.  30 g  2  . potassium chloride (K-DUR) 10 MEQ tablet Take 2 tablets (20 mEq total) by mouth once.  60 tablet  0   No current facility-administered medications for this visit.   Facility-Administered Medications Ordered in Other  Visits  Medication Dose Route Frequency Provider Last Rate Last Dose  . 0.9 %  sodium chloride infusion   Intravenous Once Myra Rude, MD      . sodium chloride 0.9 % injection 10 mL  10 mL Intracatheter PRN Exie Parody, MD   10 mL at 10/26/12 2000    OBJECTIVE: Filed Vitals:   11/15/12 0927  BP: 112/67  Pulse: 97  Temp: 100 F (37.8 C)  Resp: 20     Body mass index is 52.33 kg/(m^2).    ECOG FS:  PHYSICAL EXAMINATION:  HEENT: Sclerae anicteric.  Conjunctivae were pink. Pupils round and reactive bilaterally. Oral mucosa is moist without ulceration or thrush. No occipital, submandibular, cervical, supraclavicular or axillar adenopathy. Lungs: clear to auscultation without  wheezes. No rales or rhonchi. Heart: regular rate and rhythm. No murmur, gallop or rubs. Abdomen: soft, non tender. No guarding or rebound tenderness. Bowel sounds are present. No palpable hepatosplenomegaly. MSK: no focal spinal tenderness. Extremities: No clubbing or cyanosis.No calf tenderness to palpitation, no peripheral edema. The patient had grossly intact strength in upper and lower extremities. Skin exam was without ecchymosis, petechiae. Neuro: non-focal, alert and oriented to time, person and place, appropriate affect  LAB RESULTS:  CMP     Component Value Date/Time   NA 142 11/22/2012 0908   NA 141 10/12/2011 0957   NA 140 07/29/2011 1314   K 3.2* 11/22/2012 0908   K 4.4 10/12/2011 0957   K 4.1 07/29/2011 1314   CL 102 09/21/2012 0917   CL 95* 10/12/2011 0957   CL 102 07/29/2011 1314   CO2 28 11/22/2012 0908   CO2 29 10/12/2011 0957   CO2 28 07/29/2011 1314   GLUCOSE 144* 11/22/2012 0908   GLUCOSE 161* 09/21/2012 0917   GLUCOSE 131* 10/12/2011 0957   GLUCOSE 87 07/29/2011 1314   BUN 27.3* 11/22/2012 0908   BUN 15 10/12/2011 0957   BUN 13 07/29/2011 1314   CREATININE 1.3* 11/22/2012 0908   CREATININE 0.9 10/12/2011 0957   CREATININE 0.87 07/29/2011 1314   CALCIUM 8.9 11/22/2012 0908   CALCIUM 9.1 10/12/2011 0957    CALCIUM 9.1 07/29/2011 1314   PROT 6.4 11/15/2012 0915   PROT 7.2 10/12/2011 0957   PROT 6.4 07/29/2011 1314   ALBUMIN 3.4* 11/15/2012 0915   ALBUMIN 3.9 07/29/2011 1314   AST 28 11/15/2012 0915   AST 19 10/12/2011 0957   AST 16 07/29/2011 1314   ALT 24 11/15/2012 0915   ALT 20 10/12/2011 0957   ALT 14 07/29/2011 1314   ALKPHOS 74 11/15/2012 0915   ALKPHOS 65 10/12/2011 0957   ALKPHOS 63 07/29/2011 1314   BILITOT 0.21 11/15/2012 0915   BILITOT 0.60 10/12/2011 0957   BILITOT 0.3 07/29/2011 1314   GFRNONAA 63* 04/07/2011 0453   GFRAA 73* 04/07/2011 0453    Lab Results  Component Value Date   WBC 2.3* 11/22/2012   NEUTROABS 1.5 11/22/2012   HGB 11.7 11/22/2012   HCT 35.4 11/22/2012   MCV 88.7 11/22/2012   PLT 143* 11/22/2012      Chemistry      Component Value Date/Time   NA 142 11/22/2012 0908   NA 141 10/12/2011 0957   NA 140 07/29/2011 1314   K 3.2* 11/22/2012 0908   K 4.4 10/12/2011 0957   K 4.1 07/29/2011 1314   CL 102 09/21/2012 0917   CL 95* 10/12/2011 0957   CL 102 07/29/2011 1314   CO2 28 11/22/2012 0908   CO2 29 10/12/2011 0957   CO2 28 07/29/2011 1314   BUN 27.3* 11/22/2012 0908   BUN 15 10/12/2011 0957   BUN 13 07/29/2011 1314   CREATININE 1.3* 11/22/2012 0908   CREATININE 0.9 10/12/2011 0957   CREATININE 0.87 07/29/2011 1314      Component Value Date/Time   CALCIUM 8.9 11/22/2012 0908   CALCIUM 9.1 10/12/2011 0957   CALCIUM 9.1 07/29/2011 1314   ALKPHOS 74 11/15/2012 0915   ALKPHOS 65 10/12/2011 0957   ALKPHOS 63 07/29/2011 1314   AST 28 11/15/2012 0915   AST 19 10/12/2011 0957   AST 16 07/29/2011 1314   ALT 24 11/15/2012 0915   ALT 20 10/12/2011 0957   ALT 14 07/29/2011 1314   BILITOT 0.21 11/15/2012  0915   BILITOT 0.60 10/12/2011 0957   BILITOT 0.3 07/29/2011 1314      Urinalysis    Component Value Date/Time   COLORURINE YELLOW 04/05/2011 1829   APPEARANCEUR CLOUDY* 04/05/2011 1829   LABSPEC 1.025 04/05/2011 1829   PHURINE 6.0 04/05/2011 1829   GLUCOSEU NEGATIVE 04/05/2011 1829   HGBUR NEGATIVE  04/05/2011 1829   BILIRUBINUR SMALL* 04/05/2011 1829   KETONESUR NEGATIVE 04/05/2011 1829   PROTEINUR 30* 04/05/2011 1829   UROBILINOGEN 2.0* 04/05/2011 1829   NITRITE NEGATIVE 04/05/2011 1829   LEUKOCYTESUR NEGATIVE 04/05/2011 1829    ASSESSMENT AND PLAN: 1. Recurrent high grade NHL: - Diagnosis: Relapsed high grade non-Hodgkin's B cell lymphoma.  We discussed with patient possibility to adjust her treatment to full dose regiment. The patient is agrees to start full dose regiment. * Proceed with full dose of salvage chemo: R-GPD (Rituxan day1; Gemzar day 1 and 8; Cisplatin day 1; and Dexamethasone IV day 1 and oral days 2, 3, and 4).Dose of Gemzar will be adjust on day 8. Neulasta on day 9 to decrease risk of infection. * After about 3 cycles, we will repeat scan to assess response.  * If and when remission is achieve, we can proceed with autologous bone marrow transplant at [facility] with Dr. Loistine Chance.  - Follow up: In 3 days to assess side effects.  2. Hypothyroidism: She has levothyroxine per PCP. 3. Hypertension: She is on diltiazem PCP.  4. Diabetes mellitus, type II: She is on metformin per PCP.  5. Hyperlipidemia: She is on pravastatin per PCP.  6. History of atrial fibrillation: She is on diltiazem, and she has a pacemaker. She is on Coumadin being managed per her PCP.    The length of time of the face-to-face encounter was 30 minutes. More than 50% of time was spent counseling and coordination of care.   Myra Rude, MD   12/05/2012 8:01 AM

## 2012-12-05 NOTE — Progress Notes (Signed)
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   PAST THERAPY: q3wk R-CHOP x6 cycles; finished in Dec 2012; followed by consolidative radiation therapy. She developed recurrent disease; started on bendamustine/Rituxan on 06/29/2012. He completed 3 cycles on 08/25/12. Stopped due to disease progression.   CURRENT THERAPY: She started salvage chemotherapy with R-GPD on 10/26/12   INTERVAL HISTORY:  [name] 72 y.o. female returns for  follow up visit after she started full dose cisplatin  R-GPD.  Her appetite is good  Patient reported tingling in her hand but that is not new. The patient denied fever, chills, night sweats. She denied headaches, double vision, nasal congestion, nasal discharge, hearing problems, odynophagia or dysphagia. No chest pain, palpitations, dyspnea, cough, abdominal pain, nausea, vomiting, diarrhea, constipation, hematochezia. The patient denied dysuria, nocturia, polyuria, hematuria, myalgia, numbness, psychiatric problems.  Review of Systems  Constitutional: Negative for fever, chills, weight loss, malaise/fatigue and diaphoresis.  HENT: Negative for hearing loss, ear pain, nosebleeds, congestion, sore throat, neck pain, tinnitus and ear discharge.   Eyes: Negative for blurred vision, double vision, photophobia, pain, discharge and redness.  Respiratory: Negative for cough, hemoptysis, sputum production, shortness of breath and stridor.   Cardiovascular: Negative for chest pain, palpitations, orthopnea, claudication and leg swelling.  Gastrointestinal: Negative for heartburn, nausea, vomiting, abdominal pain, diarrhea, constipation and blood in stool.  Genitourinary: Negative for dysuria, urgency, frequency and hematuria.  Musculoskeletal: Negative for  myalgias, back pain and joint pain.  Skin: Negative for itching and rash.  Neurological: Positive for tingling. Negative for dizziness, focal weakness, seizures, loss of consciousness and headaches.  Endo/Heme/Allergies: Does not bruise/bleed easily.  Psychiatric/Behavioral: Negative.     PAST MEDICAL HISTORY: Past Medical History  Diagnosis Date  . Sick sinus syndrome   . Hematoma     At the site of the pacemaker insertion.  . Hypotension   . Dyslipidemia   . Diabetes mellitus   . Pacemaker   . History of atrial fibrillation   . Arthritis     Osteoarthritis  . Hypothyroidism   . Port-a-cath in place 12/08/10  . Status post chemotherapy completed 03/2011    R - CHOP q 3 weeks x 6  . S/P radiation therapy 05/28/2011 - 06/26/2011    Right Abdomen and Right Pelvis/3060 cGy in 17 Fractions  . Diffuse large B cell lymphoma   . Cancer 10/2010    large B cell lymphoma  . 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 Non-Hodgkin's lymphoma of abdomen 05/20/2011    PAST SURGICAL HISTORY: Past Surgical History  Procedure Laterality Date  . Pacemaker insertion      Lead revision completed August 04, 2006  . Cholecystectomy    . Tubal ligation      Bilateral  . Insert / replace / remove pacemaker    . Abdominal hysterectomy    . Biopsy stomach  11/03/10    Soft Tissue Mass, Biopsy, Right Lower Quadrant Mesenteric Mass- High Grade Non-Hodgkins B Cell Lymphoma  with Flow Cytometry  . Bone biopsy  11/24/10    Bone Marrow, Aspirate Biospy, and clot, Left - No Involvement of Non-Hodgkin's Lymphoma Identified    FAMILY HISTORY Family History  Problem Relation Age of Onset  . Stroke Mother   . Stroke Father   . Aneurysm Son  Brain  . Cancer Sister      1 sister had vaginal cancer  . Cancer Brother     Prostate    HEALTH MAINTENANCE: History  Substance Use Topics  . Smoking status: Current Some Day Smoker -- 1.50 packs/day for 30 years    Types: Cigarettes  . Smokeless tobacco: Never Used   . Alcohol Use: No    Allergies  Allergen Reactions  . Codeine Nausea Only    Current Outpatient Prescriptions  Medication Sig Dispense Refill  . albuterol (PROVENTIL) (2.5 MG/3ML) 0.083% nebulizer solution Take 2.5 mg by nebulization every 6 (six) hours as needed.      Marland Kitchen allopurinol (ZYLOPRIM) 300 MG tablet Take 300 mg by mouth daily.      . Cholecalciferol (VITAMIN D3) 2000 UNITS TABS Take 2,000 Units by mouth 2 (two) times daily.       . citalopram (CELEXA) 20 MG tablet Take 20 mg by mouth every morning.       Marland Kitchen dexamethasone (DECADRON) 4 MG tablet Take 10 tablets (40 mg total) by mouth daily.  30 tablet  2  . diltiazem (CARDIZEM CD) 180 MG 24 hr capsule Take 180 mg by mouth every morning.       Marland Kitchen levothyroxine (SYNTHROID, LEVOTHROID) 100 MCG tablet Take 100 mcg by mouth every morning.       . lidocaine-prilocaine (EMLA) cream Apply topically as needed. Apply to port a cath site one hour before needle stick as needed.  30 g  2  . loperamide (IMODIUM) 2 MG capsule Take 2 mg by mouth 4 (four) times daily as needed for diarrhea or loose stools.      . metFORMIN (GLUCOPHAGE) 500 MG tablet Take 1,000 mg by mouth 2 (two) times daily.       . ondansetron (ZOFRAN) 8 MG tablet Take 1 tablet (8 mg total) by mouth every 12 (twelve) hours as needed for nausea.  20 tablet  3  . potassium chloride (K-DUR) 10 MEQ tablet Take 2 tablets (20 mEq total) by mouth once.  60 tablet  0  . pravastatin (PRAVACHOL) 40 MG tablet Take 40 mg by mouth at bedtime.       . prochlorperazine (COMPAZINE) 10 MG tablet Take 1 tablet (10 mg total) by mouth every 6 (six) hours as needed.  30 tablet  3  . promethazine (PHENERGAN) 25 MG suppository Place 1 suppository (25 mg total) rectally every 12 (twelve) hours as needed for nausea.  12 each  0  . warfarin (COUMADIN) 6 MG tablet Take 6 mg by mouth daily. 6 mg on Mon, Wed, Fri, Sat and Sundays.  3 mg on Tues and Thurs       Current Facility-Administered Medications    Medication Dose Route Frequency Provider Last Rate Last Dose  . 0.9 %  sodium chloride infusion   Intravenous Once Myra Rude, MD       Facility-Administered Medications Ordered in Other Visits  Medication Dose Route Frequency Provider Last Rate Last Dose  . sodium chloride 0.9 % injection 10 mL  10 mL Intracatheter PRN Exie Parody, MD   10 mL at 10/26/12 2000    OBJECTIVE: Filed Vitals:   11/18/12 1143  BP: 157/78  Pulse: 84  Temp: 97.7 F (36.5 C)  Resp: 20     Body mass index is 53.01 kg/(m^2).    ECOG FS:  PHYSICAL EXAMINATION:  HEENT: Sclerae anicteric.  Conjunctivae were pink. Pupils round and reactive bilaterally. Oral mucosa  is moist without ulceration or thrush. No occipital, submandibular, cervical, supraclavicular or axillar adenopathy. Lungs: clear to auscultation without wheezes. No rales or rhonchi. Heart: regular rate and rhythm. No murmur, gallop or rubs. Abdomen: soft, non tender. No guarding or rebound tenderness. Bowel sounds are present. No palpable hepatosplenomegaly. MSK: no focal spinal tenderness. Extremities: No clubbing or cyanosis.No calf tenderness to palpitation, no peripheral edema. The patient had grossly intact strength in upper and lower extremities. Skin exam was without ecchymosis, petechiae. Neuro: non-focal, alert and oriented to time, person and place, appropriate affect    Urinalysis    Component Value Date/Time   COLORURINE YELLOW 04/05/2011 1829   APPEARANCEUR CLOUDY* 04/05/2011 1829   LABSPEC 1.025 04/05/2011 1829   PHURINE 6.0 04/05/2011 1829   GLUCOSEU NEGATIVE 04/05/2011 1829   HGBUR NEGATIVE 04/05/2011 1829   BILIRUBINUR SMALL* 04/05/2011 1829   KETONESUR NEGATIVE 04/05/2011 1829   PROTEINUR 30* 04/05/2011 1829   UROBILINOGEN 2.0* 04/05/2011 1829   NITRITE NEGATIVE 04/05/2011 1829   LEUKOCYTESUR NEGATIVE 04/05/2011 1829    STUDIES: No results found.  ASSESSMENT AND PLAN: ASSESSMENT AND PLAN:  1. Recurrent high  grade NHL: - Diagnosis: Relapsed high grade non-Hodgkin's B cell lymphoma.  We discussed with patient That she tolerated treatment well. She a little bit dehydrated, She also had hypomagnesemia and hypokalemia. We will follow it and replace as needed. * Proceed with full dose of salvage chemo: R-GPD (Rituxan day 1; Gemzar day 1 and 8; Cisplatin day 1; and Dexamethasone IV day 1 and oral days 2, 3, and 4).Dose of Gemzar will be adjust on day 8. Neulasta on day 9 to decrease risk of infection.  * After about 3 cycles, we will repeat scan to assess response.  * If and when remission is achieve, we can proceed with autologous bone marrow transplant at [facility] with Dr. Loistine Chance.  - Follow up: on 12/06/12  for evaluation of next cycle.  2. Hypothyroidism: She has levothyroxine per PCP. 3. Hypertension: She is on diltiazem PCP.  4. Diabetes mellitus, type II: She is on metformin per PCP.  5. Hyperlipidemia: She is on pravastatin per PCP.  6. History of atrial fibrillation: She is on diltiazem, and she has a pacemaker. She is on Coumadin being managed per her PCP.   The length of time of the face-to-face encounter was 15 minutes. More than 50% of time was spent counseling and coordination of care.   Myra Rude, MD   11/18/2012 8:21 AM

## 2012-12-06 ENCOUNTER — Ambulatory Visit (HOSPITAL_BASED_OUTPATIENT_CLINIC_OR_DEPARTMENT_OTHER): Payer: Medicare Other

## 2012-12-06 ENCOUNTER — Telehealth: Payer: Self-pay | Admitting: *Deleted

## 2012-12-06 ENCOUNTER — Ambulatory Visit (HOSPITAL_BASED_OUTPATIENT_CLINIC_OR_DEPARTMENT_OTHER): Payer: Medicare Other | Admitting: Hematology and Oncology

## 2012-12-06 ENCOUNTER — Other Ambulatory Visit: Payer: Medicare Other | Admitting: Lab

## 2012-12-06 ENCOUNTER — Other Ambulatory Visit (HOSPITAL_BASED_OUTPATIENT_CLINIC_OR_DEPARTMENT_OTHER): Payer: Medicare Other | Admitting: Lab

## 2012-12-06 ENCOUNTER — Telehealth: Payer: Self-pay | Admitting: Hematology and Oncology

## 2012-12-06 VITALS — BP 122/67 | HR 80 | Temp 97.8°F | Resp 20 | Ht <= 58 in | Wt 236.4 lb

## 2012-12-06 DIAGNOSIS — C8590 Non-Hodgkin lymphoma, unspecified, unspecified site: Secondary | ICD-10-CM

## 2012-12-06 DIAGNOSIS — C8583 Other specified types of non-Hodgkin lymphoma, intra-abdominal lymph nodes: Secondary | ICD-10-CM

## 2012-12-06 DIAGNOSIS — E876 Hypokalemia: Secondary | ICD-10-CM

## 2012-12-06 DIAGNOSIS — E860 Dehydration: Secondary | ICD-10-CM

## 2012-12-06 LAB — COMPREHENSIVE METABOLIC PANEL (CC13)
ALT: 20 U/L (ref 0–55)
Albumin: 3.3 g/dL — ABNORMAL LOW (ref 3.5–5.0)
Alkaline Phosphatase: 119 U/L (ref 40–150)
BUN: 9.9 mg/dL (ref 7.0–26.0)
Calcium: 9 mg/dL (ref 8.4–10.4)
Chloride: 103 mEq/L (ref 98–109)
Glucose: 127 mg/dl (ref 70–140)
Potassium: 4.3 mEq/L (ref 3.5–5.1)
Sodium: 141 mEq/L (ref 136–145)
Total Bilirubin: 0.27 mg/dL (ref 0.20–1.20)
Total Protein: 6 g/dL — ABNORMAL LOW (ref 6.4–8.3)

## 2012-12-06 LAB — CBC WITH DIFFERENTIAL/PLATELET
Basophils Absolute: 0.3 10*3/uL — ABNORMAL HIGH (ref 0.0–0.1)
Eosinophils Absolute: 0.3 10*3/uL (ref 0.0–0.5)
HCT: 25.4 % — ABNORMAL LOW (ref 34.8–46.6)
HGB: 8.3 g/dL — ABNORMAL LOW (ref 11.6–15.9)
MCH: 30.5 pg (ref 25.1–34.0)
MONO#: 3.6 10*3/uL — ABNORMAL HIGH (ref 0.1–0.9)
NEUT#: 25.2 10*3/uL — ABNORMAL HIGH (ref 1.5–6.5)
NEUT%: 82.9 % — ABNORMAL HIGH (ref 38.4–76.8)
WBC: 30.4 10*3/uL — ABNORMAL HIGH (ref 3.9–10.3)
lymph#: 1 10*3/uL (ref 0.9–3.3)

## 2012-12-06 MED ORDER — SODIUM CHLORIDE 0.9 % IV SOLN
4.0000 g | Freq: Once | INTRAVENOUS | Status: DC
  Filled 2012-12-06: qty 8

## 2012-12-06 MED ORDER — SODIUM CHLORIDE 0.9 % IV SOLN
4.0000 g | Freq: Once | INTRAVENOUS | Status: AC
  Administered 2012-12-06: 4 g via INTRAVENOUS
  Filled 2012-12-06: qty 8

## 2012-12-06 NOTE — Telephone Encounter (Signed)
Per staff message and POF I have scheduled appts.  JMW  

## 2012-12-06 NOTE — Telephone Encounter (Signed)
gv and printed appt sched and avs for pt....MW added tx   °

## 2012-12-07 ENCOUNTER — Other Ambulatory Visit: Payer: Self-pay | Admitting: *Deleted

## 2012-12-07 NOTE — Progress Notes (Signed)
Dr. Alecia Lemming ordered Lab for Magnesium this Saturday 12/10/12.  Notified [employer] and faxed them order requisition for Magnesium level.  Asked them to call results to Infusion RN at [phone]. Infusion RN to call Dr. Alecia Lemming cell phone #[phone] w/ results and then administer Magnesium IV if needed.   [facility] notified of need for Magnesium to be premixed for possible infusion on 8/30.  They will mix two separate bags of 2 grams each for infusion nurse to administer depending on Dr. Alecia Lemming Rostapshov's orders.

## 2012-12-07 NOTE — Progress Notes (Signed)
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   PAST THERAPY: q3wk R-CHOP x6 cycles; finished in Dec 2012; followed by consolidative radiation therapy. She developed recurrent disease; started on bendamustine/Rituxan on 06/29/2012. He completed 3 cycles on 08/25/12. Stopped due to disease progression.   CURRENT THERAPY: She started salvage chemotherapy with R-GPD on 10/26/12   INTERVAL HISTORY:  [name] 72 y.o. female returns for follow up visit after she had full dose cisplatin R-GPD for evaluation for cycle 3. Her appetite is good  She has blurry vision Occasionally she has dyspnea. Patient reported tingling in her hand but that is not new. Once she had episode of nausea and vomit.  The patient denied fever, chills, night sweats. She denied headaches, double vision, nasal congestion, nasal discharge, hearing problems, odynophagia or dysphagia. No chest pain, palpitations,  cough, abdominal pain, diarrhea, constipation, hematochezia. The patient denied dysuria, nocturia, polyuria, hematuria, myalgia, numbness, psychiatric problems.  Review of Systems  Constitutional: Negative for fever, chills, weight loss, malaise/fatigue and diaphoresis.  HENT: Negative for hearing loss, nosebleeds, congestion, sore throat, neck pain and tinnitus.   Eyes: Positive for blurred vision. Negative for double vision, photophobia and pain.  Respiratory: Positive for shortness of breath. Negative for cough, hemoptysis, sputum production and wheezing.   Cardiovascular: Negative for chest pain, palpitations, orthopnea, claudication, leg swelling and PND.  Gastrointestinal: Positive for nausea and vomiting. Negative for abdominal pain, diarrhea, constipation, blood in stool and melena.  Genitourinary:  Negative for dysuria, urgency, frequency and hematuria.  Musculoskeletal: Negative for myalgias, back pain and joint pain.  Skin: Negative for itching and rash.  Neurological: Positive for tingling. Negative for dizziness, sensory change, focal weakness, seizures, loss of consciousness, weakness and headaches.  Endo/Heme/Allergies: Does not bruise/bleed easily.  Psychiatric/Behavioral: Negative.     PAST MEDICAL HISTORY: Past Medical History  Diagnosis Date  . Sick sinus syndrome   . Hematoma     At the site of the pacemaker insertion.  . Hypotension   . Dyslipidemia   . Diabetes mellitus   . Pacemaker   . History of atrial fibrillation   . Arthritis     Osteoarthritis  . Hypothyroidism   . Port-a-cath in place 12/08/10  . Status post chemotherapy completed 03/2011    R - CHOP q 3 weeks x 6  . S/P radiation therapy 05/28/2011 - 06/26/2011    Right Abdomen and Right Pelvis/3060 cGy in 17 Fractions  . Diffuse large B cell lymphoma   . Cancer 10/2010    large B cell lymphoma  . 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 Non-Hodgkin's lymphoma of abdomen 05/20/2011    PAST SURGICAL HISTORY: Past Surgical History  Procedure Laterality Date  . Pacemaker insertion      Lead revision completed August 04, 2006  . Cholecystectomy    . Tubal ligation      Bilateral  . Insert / replace / remove pacemaker    . Abdominal hysterectomy    . Biopsy stomach  11/03/10    Soft Tissue Mass, Biopsy, Right Lower Quadrant Mesenteric Mass- High Grade Non-Hodgkins B Cell Lymphoma  with Flow Cytometry  . Bone biopsy  11/24/10    Bone Marrow, Aspirate Biospy, and clot, Left - No Involvement of Non-Hodgkin's Lymphoma Identified    FAMILY HISTORY Family History  Problem Relation Age of Onset  . Stroke Mother   . Stroke Father   . Aneurysm Son     Brain  . Cancer Sister      1 sister had vaginal cancer  . Cancer Brother     Prostate    HEALTH MAINTENANCE: History  Substance Use Topics  . Smoking  status: Current Some Day Smoker -- 1.50 packs/day for 30 years    Types: Cigarettes  . Smokeless tobacco: Never Used  . Alcohol Use: No    Allergies  Allergen Reactions  . Codeine Nausea Only    Current Outpatient Prescriptions  Medication Sig Dispense Refill  . albuterol (PROVENTIL) (2.5 MG/3ML) 0.083% nebulizer solution Take 2.5 mg by nebulization every 6 (six) hours as needed.      Marland Kitchen allopurinol (ZYLOPRIM) 300 MG tablet Take 300 mg by mouth daily.      . Cholecalciferol (VITAMIN D3) 2000 UNITS TABS Take 2,000 Units by mouth 2 (two) times daily.       . citalopram (CELEXA) 20 MG tablet Take 20 mg by mouth every morning.       Marland Kitchen dexamethasone (DECADRON) 4 MG tablet Take 10 tablets (40 mg total) by mouth daily.  30 tablet  2  . diltiazem (CARDIZEM CD) 180 MG 24 hr capsule Take 180 mg by mouth every morning.       Marland Kitchen levothyroxine (SYNTHROID, LEVOTHROID) 100 MCG tablet Take 100 mcg by mouth every morning.       . lidocaine-prilocaine (EMLA) cream Apply topically as needed. Apply to port a cath site one hour before needle stick as needed.  30 g  2  . loperamide (IMODIUM) 2 MG capsule Take 2 mg by mouth 4 (four) times daily as needed for diarrhea or loose stools.      . metFORMIN (GLUCOPHAGE) 500 MG tablet Take 1,000 mg by mouth 2 (two) times daily.       . ondansetron (ZOFRAN) 8 MG tablet Take 1 tablet (8 mg total) by mouth every 12 (twelve) hours as needed for nausea.  20 tablet  3  . potassium chloride (K-DUR) 10 MEQ tablet Take 2 tablets (20 mEq total) by mouth once.  60 tablet  0  . pravastatin (PRAVACHOL) 40 MG tablet Take 40 mg by mouth at bedtime.       . prochlorperazine (COMPAZINE) 10 MG tablet Take 1 tablet (10 mg total) by mouth every 6 (six) hours as needed.  30 tablet  3  . promethazine (PHENERGAN) 25 MG suppository Place 1 suppository (25 mg total) rectally every 12 (twelve) hours as needed for nausea.  12 each  0  . warfarin (COUMADIN) 6 MG tablet Take 6 mg by mouth daily. 6  mg on Mon, Wed, Fri, Sat and Sundays.  3 mg on Tues and Thurs       No current facility-administered medications for this visit.   Facility-Administered Medications Ordered in Other Visits  Medication Dose Route Frequency Provider Last Rate Last Dose  . sodium chloride 0.9 % injection 10 mL  10 mL Intracatheter PRN Exie Parody, MD   10 mL at 10/26/12 2000    OBJECTIVE: Filed Vitals:   12/06/12 0924  BP: 122/67  Pulse: 80  Temp: 97.8 F (36.6 C)  Resp: 20     Body mass index is 53.03 kg/(m^2).    ECOG FS: 2  PHYSICAL EXAMINATION:  HEENT: Sclerae anicteric.  Conjunctivae were pink. Pupils round and reactive bilaterally. Oral mucosa is moist  without ulceration or thrush. No occipital, submandibular, cervical, supraclavicular or axillar adenopathy. Lungs: clear to auscultation without wheezes. No rales or rhonchi. Heart: regular rate and rhythm. No murmur, gallop or rubs. Abdomen: soft, non tender. No guarding or rebound tenderness. Bowel sounds are present. No palpable hepatosplenomegaly. MSK: no focal spinal tenderness. Extremities: No clubbing or cyanosis.No calf tenderness to palpitation, no peripheral edema. The patient had grossly intact strength in upper and lower extremities. Skin exam was without ecchymosis, petechiae. Neuro: non-focal, alert and oriented to time, person and place, appropriate affect  LAB RESULTS:  CMP     Component Value Date/Time   NA 141 12/06/2012 0858   NA 141 10/12/2011 0957   NA 140 07/29/2011 1314   K 4.3 12/06/2012 0858   K 4.4 10/12/2011 0957   K 4.1 07/29/2011 1314   CL 102 09/21/2012 0917   CL 95* 10/12/2011 0957   CL 102 07/29/2011 1314   CO2 25 12/06/2012 0858   CO2 29 10/12/2011 0957   CO2 28 07/29/2011 1314   GLUCOSE 127 12/06/2012 0858   GLUCOSE 161* 09/21/2012 0917   GLUCOSE 131* 10/12/2011 0957   GLUCOSE 87 07/29/2011 1314   BUN 9.9 12/06/2012 0858   BUN 15 10/12/2011 0957   BUN 13 07/29/2011 1314   CREATININE 1.1 12/06/2012 0858   CREATININE 0.9  10/12/2011 0957   CREATININE 0.87 07/29/2011 1314   CALCIUM 9.0 12/06/2012 0858   CALCIUM 9.1 10/12/2011 0957   CALCIUM 9.1 07/29/2011 1314   PROT 6.0* 12/06/2012 0858   PROT 7.2 10/12/2011 0957   PROT 6.4 07/29/2011 1314   ALBUMIN 3.3* 12/06/2012 0858   ALBUMIN 3.9 07/29/2011 1314   AST 32 12/06/2012 0858   AST 19 10/12/2011 0957   AST 16 07/29/2011 1314   ALT 20 12/06/2012 0858   ALT 20 10/12/2011 0957   ALT 14 07/29/2011 1314   ALKPHOS 119 12/06/2012 0858   ALKPHOS 65 10/12/2011 0957   ALKPHOS 63 07/29/2011 1314   BILITOT 0.27 12/06/2012 0858   BILITOT 0.60 10/12/2011 0957   BILITOT 0.3 07/29/2011 1314   GFRNONAA 63* 04/07/2011 0453   GFRAA 73* 04/07/2011 0453    Lab Results  Component Value Date   WBC 30.4* 12/06/2012   NEUTROABS 25.2* 12/06/2012   HGB 8.3* 12/06/2012   HCT 25.4* 12/06/2012   MCV 93.7 12/06/2012   PLT 190 12/06/2012      Chemistry      Component Value Date/Time   NA 141 12/06/2012 0858   NA 141 10/12/2011 0957   NA 140 07/29/2011 1314   K 4.3 12/06/2012 0858   K 4.4 10/12/2011 0957   K 4.1 07/29/2011 1314   CL 102 09/21/2012 0917   CL 95* 10/12/2011 0957   CL 102 07/29/2011 1314   CO2 25 12/06/2012 0858   CO2 29 10/12/2011 0957   CO2 28 07/29/2011 1314   BUN 9.9 12/06/2012 0858   BUN 15 10/12/2011 0957   BUN 13 07/29/2011 1314   CREATININE 1.1 12/06/2012 0858   CREATININE 0.9 10/12/2011 0957   CREATININE 0.87 07/29/2011 1314      Component Value Date/Time   CALCIUM 9.0 12/06/2012 0858   CALCIUM 9.1 10/12/2011 0957   CALCIUM 9.1 07/29/2011 1314   ALKPHOS 119 12/06/2012 0858   ALKPHOS 65 10/12/2011 0957   ALKPHOS 63 07/29/2011 1314   AST 32 12/06/2012 0858   AST 19 10/12/2011 0957   AST 16 07/29/2011 1314   ALT 20 12/06/2012 0858  ALT 20 10/12/2011 0957   ALT 14 07/29/2011 1314   BILITOT 0.27 12/06/2012 0858   BILITOT 0.60 10/12/2011 0957   BILITOT 0.3 07/29/2011 1314      ASSESSMENT AND PLAN:     1.  Relapsed high grade non-Hodgkin's B cell lymphoma.  -  We discussed with patient that she  tolerated treatment well. She a little bit dehydrated, She also had hypomagnesemia and hypokalemia.  -  Proceed with full dose of salvage chemo: R-GPD (Rituxan day 1; Gemzar day 1 and 8; Cisplatin day 1; and Dexamethasone IV day 1 and oral days 2, 3, and 4). Neulasta on day 9 to decrease risk of infection.  * After about 3 cycles, we will repeat scan to assess response.  * If and when remission is achieve, we can proceed with autologous bone marrow transplant at [facility] with Dr. Loistine Chance.  - Follow up: on day 8 cycle 3 for evaluation of next cycle.  2. Hypothyroidism: She has levothyroxine per PCP. 3. Hypomagnesemia. Magnesium sulfate  4 gm IV. Check magnesium on 08/30 and 12/13/12 4. Hypertension: She is on diltiazem PCP.  5. Diabetes mellitus, type II: She is on metformin per PCP.  6. Hyperlipidemia: She is on pravastatin per PCP.  7. History of atrial fibrillation: She is on diltiazem, and she has a pacemaker. She is on Coumadin being managed per her PCP.  The length of time of the face-to-face encounter was 25 minutes. More than 50% of time was spent counseling and coordination of care.   Myra Rude, MD   12/06/2012 4:34 PM

## 2012-12-08 ENCOUNTER — Other Ambulatory Visit: Payer: Self-pay | Admitting: Hematology and Oncology

## 2012-12-08 ENCOUNTER — Ambulatory Visit (HOSPITAL_BASED_OUTPATIENT_CLINIC_OR_DEPARTMENT_OTHER): Payer: Medicare Other

## 2012-12-08 ENCOUNTER — Other Ambulatory Visit: Payer: Medicare Other | Admitting: Lab

## 2012-12-08 VITALS — BP 90/60 | HR 73 | Temp 97.1°F | Resp 18

## 2012-12-08 DIAGNOSIS — C8330 Diffuse large B-cell lymphoma, unspecified site: Secondary | ICD-10-CM

## 2012-12-08 DIAGNOSIS — C8583 Other specified types of non-Hodgkin lymphoma, intra-abdominal lymph nodes: Secondary | ICD-10-CM

## 2012-12-08 DIAGNOSIS — Z5111 Encounter for antineoplastic chemotherapy: Secondary | ICD-10-CM

## 2012-12-08 DIAGNOSIS — Z5112 Encounter for antineoplastic immunotherapy: Secondary | ICD-10-CM

## 2012-12-08 DIAGNOSIS — C8590 Non-Hodgkin lymphoma, unspecified, unspecified site: Secondary | ICD-10-CM

## 2012-12-08 MED ORDER — SODIUM CHLORIDE 0.9 % IJ SOLN
10.0000 mL | INTRAMUSCULAR | Status: DC | PRN
  Administered 2012-12-08: 10 mL
  Filled 2012-12-08: qty 10

## 2012-12-08 MED ORDER — POTASSIUM CHLORIDE 2 MEQ/ML IV SOLN
Freq: Once | INTRAVENOUS | Status: AC
  Administered 2012-12-08: 09:00:00 via INTRAVENOUS
  Filled 2012-12-08: qty 10

## 2012-12-08 MED ORDER — DIPHENHYDRAMINE HCL 25 MG PO CAPS
50.0000 mg | ORAL_CAPSULE | Freq: Once | ORAL | Status: AC
  Administered 2012-12-08: 50 mg via ORAL

## 2012-12-08 MED ORDER — HEPARIN SOD (PORK) LOCK FLUSH 100 UNIT/ML IV SOLN
500.0000 [IU] | Freq: Once | INTRAVENOUS | Status: AC | PRN
  Administered 2012-12-08: 500 [IU]
  Filled 2012-12-08: qty 5

## 2012-12-08 MED ORDER — SODIUM CHLORIDE 0.9 % IV SOLN
Freq: Once | INTRAVENOUS | Status: AC
  Administered 2012-12-08: 09:00:00 via INTRAVENOUS

## 2012-12-08 MED ORDER — SODIUM CHLORIDE 0.9 % IV SOLN
375.0000 mg/m2 | Freq: Once | INTRAVENOUS | Status: AC
  Administered 2012-12-08: 800 mg via INTRAVENOUS
  Filled 2012-12-08: qty 80

## 2012-12-08 MED ORDER — SODIUM CHLORIDE 0.9 % IV SOLN
1000.0000 mg/m2 | Freq: Once | INTRAVENOUS | Status: AC
  Administered 2012-12-08: 2052 mg via INTRAVENOUS
  Filled 2012-12-08: qty 53.97

## 2012-12-08 MED ORDER — ACETAMINOPHEN 325 MG PO TABS
650.0000 mg | ORAL_TABLET | Freq: Once | ORAL | Status: AC
  Administered 2012-12-08: 650 mg via ORAL

## 2012-12-08 MED ORDER — DEXAMETHASONE SODIUM PHOSPHATE 20 MG/5ML IJ SOLN
12.0000 mg | Freq: Once | INTRAMUSCULAR | Status: AC
  Administered 2012-12-08: 12 mg via INTRAVENOUS

## 2012-12-08 MED ORDER — SODIUM CHLORIDE 0.9 % IV SOLN
150.0000 mg | Freq: Once | INTRAVENOUS | Status: AC
  Administered 2012-12-08: 150 mg via INTRAVENOUS
  Filled 2012-12-08: qty 5

## 2012-12-08 MED ORDER — PALONOSETRON HCL INJECTION 0.25 MG/5ML
0.2500 mg | Freq: Once | INTRAVENOUS | Status: AC
  Administered 2012-12-08: 0.25 mg via INTRAVENOUS

## 2012-12-08 MED ORDER — SODIUM CHLORIDE 0.9 % IV SOLN
75.0000 mg/m2 | Freq: Once | INTRAVENOUS | Status: AC
  Administered 2012-12-08: 153 mg via INTRAVENOUS
  Filled 2012-12-08: qty 153

## 2012-12-08 NOTE — Patient Instructions (Signed)
[facility] Discharge Instructions for Patients Receiving Chemotherapy  Today you received the following chemotherapy agents Rituxan/Cisplatin/Gemzar  To help prevent nausea and vomiting after your treatment, we encourage you to take your nausea medication as prescribed.  If you develop nausea and vomiting that is not controlled by your nausea medication, call the clinic.   BELOW ARE SYMPTOMS THAT SHOULD BE REPORTED IMMEDIATELY:  *FEVER GREATER THAN 100.5 F  *CHILLS WITH OR WITHOUT FEVER  NAUSEA AND VOMITING THAT IS NOT CONTROLLED WITH YOUR NAUSEA MEDICATION  *UNUSUAL SHORTNESS OF BREATH  *UNUSUAL BRUISING OR BLEEDING  TENDERNESS IN MOUTH AND THROAT WITH OR WITHOUT PRESENCE OF ULCERS  *URINARY PROBLEMS  *BOWEL PROBLEMS  UNUSUAL RASH Items with * indicate a potential emergency and should be followed up as soon as possible.  Feel free to call the clinic you have any questions or concerns. The clinic phone number is [phone].

## 2012-12-09 ENCOUNTER — Other Ambulatory Visit: Payer: Self-pay | Admitting: *Deleted

## 2012-12-10 ENCOUNTER — Ambulatory Visit (HOSPITAL_BASED_OUTPATIENT_CLINIC_OR_DEPARTMENT_OTHER): Payer: Medicare Other

## 2012-12-10 MED ORDER — SODIUM CHLORIDE 0.9 % IV SOLN
2.0000 g | Freq: Once | INTRAVENOUS | Status: AC
  Administered 2012-12-10: 2 g via INTRAVENOUS
  Filled 2012-12-10: qty 4

## 2012-12-13 ENCOUNTER — Other Ambulatory Visit: Payer: Self-pay | Admitting: *Deleted

## 2012-12-13 ENCOUNTER — Ambulatory Visit (HOSPITAL_BASED_OUTPATIENT_CLINIC_OR_DEPARTMENT_OTHER): Payer: Medicare Other

## 2012-12-13 ENCOUNTER — Other Ambulatory Visit (HOSPITAL_BASED_OUTPATIENT_CLINIC_OR_DEPARTMENT_OTHER): Payer: Medicare Other

## 2012-12-13 DIAGNOSIS — C8583 Other specified types of non-Hodgkin lymphoma, intra-abdominal lymph nodes: Secondary | ICD-10-CM

## 2012-12-13 DIAGNOSIS — C8330 Diffuse large B-cell lymphoma, unspecified site: Secondary | ICD-10-CM

## 2012-12-13 DIAGNOSIS — C8590 Non-Hodgkin lymphoma, unspecified, unspecified site: Secondary | ICD-10-CM

## 2012-12-13 DIAGNOSIS — Z5189 Encounter for other specified aftercare: Secondary | ICD-10-CM

## 2012-12-13 LAB — CBC WITH DIFFERENTIAL/PLATELET
Eosinophils Absolute: 0 10*3/uL (ref 0.0–0.5)
LYMPH%: 18.5 % (ref 14.0–49.7)
MCV: 93.5 fL (ref 79.5–101.0)
MONO%: 2.9 % (ref 0.0–14.0)
NEUT%: 77 % — ABNORMAL HIGH (ref 38.4–76.8)
Platelets: 203 10*3/uL (ref 145–400)
RBC: 2.76 10*6/uL — ABNORMAL LOW (ref 3.70–5.45)
WBC: 2.4 10*3/uL — ABNORMAL LOW (ref 3.9–10.3)

## 2012-12-13 LAB — MAGNESIUM (CC13): Magnesium: 1 mg/dl — CL (ref 1.5–2.5)

## 2012-12-13 MED ORDER — SODIUM CHLORIDE 0.9 % IV SOLN
4.0000 g | Freq: Once | INTRAVENOUS | Status: DC
  Administered 2012-12-13: 4 g via INTRAVENOUS
  Filled 2012-12-13: qty 8

## 2012-12-13 NOTE — Patient Instructions (Signed)
[facility] Discharge Instructions  Today you received the following agents Magnesium. To help prevent nausea and vomiting after your treatment, we encourage you to take your nausea medication as prescribed.   If you develop nausea and vomiting that is not controlled by your nausea medication, call the clinic.   BELOW ARE SYMPTOMS THAT SHOULD BE REPORTED IMMEDIATELY:  *FEVER GREATER THAN 100.5 F  *CHILLS WITH OR WITHOUT FEVER  NAUSEA AND VOMITING THAT IS NOT CONTROLLED WITH YOUR NAUSEA MEDICATION  *UNUSUAL SHORTNESS OF BREATH  *UNUSUAL BRUISING OR BLEEDING  TENDERNESS IN MOUTH AND THROAT WITH OR WITHOUT PRESENCE OF ULCERS  *URINARY PROBLEMS  *BOWEL PROBLEMS  UNUSUAL RASH Items with * indicate a potential emergency and should be followed up as soon as possible.  Feel free to call the clinic you have any questions or concerns. The clinic phone number is [phone].

## 2012-12-14 ENCOUNTER — Ambulatory Visit: Payer: Medicare Other

## 2012-12-15 ENCOUNTER — Ambulatory Visit (HOSPITAL_BASED_OUTPATIENT_CLINIC_OR_DEPARTMENT_OTHER): Payer: Medicare Other

## 2012-12-15 ENCOUNTER — Ambulatory Visit (HOSPITAL_BASED_OUTPATIENT_CLINIC_OR_DEPARTMENT_OTHER): Payer: Medicare Other | Admitting: Hematology and Oncology

## 2012-12-15 ENCOUNTER — Ambulatory Visit (HOSPITAL_COMMUNITY)
Admission: RE | Admit: 2012-12-15 | Discharge: 2012-12-15 | Disposition: A | Discharge status: Home / Self Care | Payer: Medicare Other | Source: Ambulatory Visit | Attending: Hematology and Oncology | Admitting: Hematology and Oncology

## 2012-12-15 ENCOUNTER — Other Ambulatory Visit (HOSPITAL_BASED_OUTPATIENT_CLINIC_OR_DEPARTMENT_OTHER): Payer: Medicare Other | Admitting: Lab

## 2012-12-15 ENCOUNTER — Other Ambulatory Visit: Payer: Medicare Other | Admitting: Lab

## 2012-12-15 VITALS — BP 112/63 | HR 78 | Temp 98.1°F | Resp 18 | Ht <= 58 in | Wt 228.4 lb

## 2012-12-15 VITALS — BP 149/81 | HR 73 | Temp 98.0°F | Resp 18

## 2012-12-15 DIAGNOSIS — C8583 Other specified types of non-Hodgkin lymphoma, intra-abdominal lymph nodes: Secondary | ICD-10-CM

## 2012-12-15 DIAGNOSIS — E119 Type 2 diabetes mellitus without complications: Secondary | ICD-10-CM

## 2012-12-15 DIAGNOSIS — C8330 Diffuse large B-cell lymphoma, unspecified site: Secondary | ICD-10-CM

## 2012-12-15 DIAGNOSIS — C8589 Other specified types of non-Hodgkin lymphoma, extranodal and solid organ sites: Secondary | ICD-10-CM

## 2012-12-15 DIAGNOSIS — C8590 Non-Hodgkin lymphoma, unspecified, unspecified site: Secondary | ICD-10-CM

## 2012-12-15 DIAGNOSIS — E039 Hypothyroidism, unspecified: Secondary | ICD-10-CM

## 2012-12-15 DIAGNOSIS — Z5111 Encounter for antineoplastic chemotherapy: Secondary | ICD-10-CM

## 2012-12-15 DIAGNOSIS — I10 Essential (primary) hypertension: Secondary | ICD-10-CM

## 2012-12-15 LAB — CBC WITH DIFFERENTIAL/PLATELET
BASO%: 3.6 % — ABNORMAL HIGH (ref 0.0–2.0)
EOS%: 0.9 % (ref 0.0–7.0)
HCT: 21.9 % — ABNORMAL LOW (ref 34.8–46.6)
MCH: 30.1 pg (ref 25.1–34.0)
MCHC: 32.4 g/dL (ref 31.5–36.0)
MONO#: 0.1 10*3/uL (ref 0.1–0.9)
NEUT%: 58 % (ref 38.4–76.8)
RBC: 2.36 10*6/uL — ABNORMAL LOW (ref 3.70–5.45)
RDW: 18.2 % — ABNORMAL HIGH (ref 11.2–14.5)
WBC: 1.1 10*3/uL — ABNORMAL LOW (ref 3.9–10.3)
lymph#: 0.3 10*3/uL — ABNORMAL LOW (ref 0.9–3.3)
nRBC: 0 % (ref 0–0)

## 2012-12-15 LAB — PREPARE RBC (CROSSMATCH)

## 2012-12-15 LAB — ABO/RH: ABO/RH(D): B POS

## 2012-12-15 LAB — HOLD TUBE, BLOOD BANK

## 2012-12-15 LAB — MAGNESIUM (CC13): Magnesium: 1.4 mg/dl — CL (ref 1.5–2.5)

## 2012-12-15 MED ORDER — SODIUM CHLORIDE 0.9 % IV SOLN
750.0000 mg/m2 | Freq: Once | INTRAVENOUS | Status: AC
  Administered 2012-12-15: 1520 mg via INTRAVENOUS
  Filled 2012-12-15: qty 39.98

## 2012-12-15 MED ORDER — ACETAMINOPHEN 325 MG PO TABS
650.0000 mg | ORAL_TABLET | Freq: Once | ORAL | Status: AC
  Administered 2012-12-15: 650 mg via ORAL

## 2012-12-15 MED ORDER — SODIUM CHLORIDE 0.9 % IJ SOLN
10.0000 mL | INTRAMUSCULAR | Status: DC | PRN
  Administered 2012-12-15: 10 mL
  Filled 2012-12-15: qty 10

## 2012-12-15 MED ORDER — SODIUM CHLORIDE 0.9 % IV SOLN
Freq: Once | INTRAVENOUS | Status: AC
  Administered 2012-12-15: 11:00:00 via INTRAVENOUS

## 2012-12-15 MED ORDER — MAGNESIUM OXIDE 400 (241.3 MG) MG PO TABS: 400.0000 mg | ORAL_TABLET | Freq: Two times a day (BID) | ORAL | Status: DC

## 2012-12-15 MED ORDER — HEPARIN SOD (PORK) LOCK FLUSH 100 UNIT/ML IV SOLN
500.0000 [IU] | Freq: Once | INTRAVENOUS | Status: AC | PRN
  Administered 2012-12-15: 500 [IU]
  Filled 2012-12-15: qty 5

## 2012-12-15 MED ORDER — PROCHLORPERAZINE MALEATE 10 MG PO TABS
10.0000 mg | ORAL_TABLET | Freq: Once | ORAL | Status: AC
  Administered 2012-12-15: 10 mg via ORAL

## 2012-12-15 MED ORDER — DIPHENHYDRAMINE HCL 25 MG PO CAPS
25.0000 mg | ORAL_CAPSULE | Freq: Once | ORAL | Status: AC
  Administered 2012-12-15: 25 mg via ORAL

## 2012-12-15 NOTE — Patient Instructions (Addendum)
[facility] Discharge Instructions for Patients Receiving Chemotherapy  MD ordered magnesium 400 mg by mouth two times a day. The Rx was called into [facility] on [address].  Today you received the following chemotherapy agents GEMZAR and 2 UNITS OF BLOOD  To help prevent nausea and vomiting after your treatment, we encourage you to take your nausea medication ABOUT 5 PM THIS AFTERNOON IF NEEDED   If you develop nausea and vomiting that is not controlled by your nausea medication, call the clinic.   BELOW ARE SYMPTOMS THAT SHOULD BE REPORTED IMMEDIATELY:  *FEVER GREATER THAN 100.5 F  *CHILLS WITH OR WITHOUT FEVER  NAUSEA AND VOMITING THAT IS NOT CONTROLLED WITH YOUR NAUSEA MEDICATION  *UNUSUAL SHORTNESS OF BREATH  *UNUSUAL BRUISING OR BLEEDING  TENDERNESS IN MOUTH AND THROAT WITH OR WITHOUT PRESENCE OF ULCERS  *URINARY PROBLEMS  *BOWEL PROBLEMS  UNUSUAL RASH Items with * indicate a potential emergency and should be followed up as soon as possible.  Feel free to call the clinic you have any questions or concerns. The clinic phone number is [phone].   Blood Transfusion  A blood transfusion replaces your blood or some of its parts. Blood is replaced when you have lost blood because of surgery, an accident, or for severe blood conditions like anemia. You can donate blood to be used on yourself if you have a planned surgery. If you lose blood during that surgery, your own blood can be given back to you. Any blood given to you is checked to make sure it matches your blood type. Your temperature, blood pressure, and heart rate (vital signs) will be checked often.  GET HELP RIGHT AWAY IF:   You feel sick to your stomach (nauseous) or throw up (vomit).  You have watery poop (diarrhea).  You have shortness of breath or trouble breathing.  You have blood in your pee (urine) or have dark colored pee.  You have chest pain or tightness.  Your eyes or skin  turn yellow (jaundice).  You have a temperature by mouth above 102 F (38.9 C), not controlled by medicine.  You start to shake and have chills.  You develop a a red rash (hives) or feel itchy.  You develop lightheadedness or feel confused.  You develop back, joint, or muscle pain.  You do not feel hungry (lost appetite).  You feel tired, restless, or nervous.  You develop belly (abdominal) cramps. Document Released: 06/26/2008 Document Revised: 06/22/2011 Document Reviewed: 06/26/2008 [facility] Patient Information 2014 [place], [place].

## 2012-12-15 NOTE — Progress Notes (Signed)
Pt OK to get gemzar. Pt will receive 2 units blood today.

## 2012-12-16 ENCOUNTER — Telehealth: Payer: Self-pay | Admitting: *Deleted

## 2012-12-16 ENCOUNTER — Other Ambulatory Visit: Payer: Self-pay | Admitting: *Deleted

## 2012-12-16 ENCOUNTER — Telehealth: Payer: Self-pay | Admitting: Hematology and Oncology

## 2012-12-16 ENCOUNTER — Ambulatory Visit (HOSPITAL_BASED_OUTPATIENT_CLINIC_OR_DEPARTMENT_OTHER): Payer: Medicare Other

## 2012-12-16 ENCOUNTER — Other Ambulatory Visit (HOSPITAL_COMMUNITY): Payer: Medicare Other

## 2012-12-16 VITALS — BP 115/61 | HR 73 | Temp 98.1°F

## 2012-12-16 DIAGNOSIS — C8330 Diffuse large B-cell lymphoma, unspecified site: Secondary | ICD-10-CM

## 2012-12-16 DIAGNOSIS — C8583 Other specified types of non-Hodgkin lymphoma, intra-abdominal lymph nodes: Secondary | ICD-10-CM

## 2012-12-16 DIAGNOSIS — C8590 Non-Hodgkin lymphoma, unspecified, unspecified site: Secondary | ICD-10-CM

## 2012-12-16 LAB — TYPE AND SCREEN
ABO/RH(D): B POS
Antibody Screen: NEGATIVE
Unit division: 0

## 2012-12-16 MED ORDER — PEGFILGRASTIM INJECTION 6 MG/0.6ML
6.0000 mg | Freq: Once | SUBCUTANEOUS | Status: AC
  Administered 2012-12-16: 6 mg via SUBCUTANEOUS
  Filled 2012-12-16: qty 0.6

## 2012-12-16 NOTE — Telephone Encounter (Signed)
Informed niece of new order for CT scan to be done w/i one week.  Also instructed to make appt for pt to go back to [facility],  Dr. Vear Clock, w/i next one to two weeks,  Per Dr. Alecia Lemming.  Assess whether pt to have more cycles of chemotherapy or ready to start w/ transplant process?  She verbalized understanding and will call to make appt w/ Dr. Vear Clock at [facility].

## 2012-12-16 NOTE — Telephone Encounter (Signed)
Gave pt appt for labs 2x a week, pt was here yesterday, she just has injections today

## 2012-12-18 NOTE — Progress Notes (Signed)
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   PAST THERAPY: q3wk R-CHOP x6 cycles; finished in Dec 2012; followed by consolidative radiation therapy. She developed recurrent disease; started on bendamustine/Rituxan on 06/29/2012. She completed 3 cycles on 08/25/12. Stopped due to disease progression.   CURRENT THERAPY: She started salvage chemotherapy with R-GPD on 10/26/12   INTERVAL HISTORY:  [name] 72 y.o. female returns for follow up visit after she had full dose cisplatin and Gemzar on day 1 in cycle 3 R-GPD  for evaluation for day 8 cycle 3. Her appetite is good . She reported that she vomit 3 times on Tuesday. She took antinausea medication and feels better,  The patient denied fever, chills, night sweats, change in appetite or weight. She denied headaches, double vision, blurry vision, nasal congestion, nasal discharge, hearing problems, odynophagia or dysphagia. No chest pain, palpitations, dyspnea, cough, abdominal pain, vomiting, diarrhea, constipation, hematochezia. The patient denied dysuria, nocturia, polyuria, hematuria, myalgia, numbness, tingling, psychiatric problems. Review of Systems  Constitutional: Negative for fever, chills, weight loss, malaise/fatigue and diaphoresis.  HENT: Positive for tinnitus. Negative for hearing loss, congestion, sore throat, neck pain and ear discharge.   Eyes: Negative for blurred vision, double vision, photophobia and pain.  Respiratory: Negative for cough, hemoptysis, sputum production, shortness of breath and wheezing.   Cardiovascular: Negative for chest pain, palpitations, orthopnea and claudication.  Gastrointestinal: Positive for vomiting. Negative for heartburn, nausea, abdominal pain, diarrhea, constipation, blood in  stool and melena.  Genitourinary: Negative for dysuria, urgency, frequency, hematuria and flank pain.  Musculoskeletal: Negative for myalgias, back pain and joint pain.  Skin: Negative for itching and rash.  Neurological: Negative for dizziness, tingling, tremors, sensory change, speech change, focal weakness, seizures, loss of consciousness, weakness and headaches.  Endo/Heme/Allergies: Does not bruise/bleed easily.  Psychiatric/Behavioral: Negative.      PAST MEDICAL HISTORY: Past Medical History  Diagnosis Date  . Sick sinus syndrome   . Hematoma     At the site of the pacemaker insertion.  . Hypotension   . Dyslipidemia   . Diabetes mellitus   . Pacemaker   . History of atrial fibrillation   . Arthritis     Osteoarthritis  . Hypothyroidism   . Port-a-cath in place 12/08/10  . Status post chemotherapy completed 03/2011    R - CHOP q 3 weeks x 6  . S/P radiation therapy 05/28/2011 - 06/26/2011    Right Abdomen and Right Pelvis/3060 cGy in 17 Fractions  . Diffuse large B cell lymphoma   . Cancer 10/2010    large B cell lymphoma  . 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 Non-Hodgkin's lymphoma of abdomen 05/20/2011    PAST SURGICAL HISTORY: Past Surgical History  Procedure Laterality Date  . Pacemaker insertion      Lead revision completed August 04, 2006  . Cholecystectomy    . Tubal ligation      Bilateral  . Insert / replace / remove pacemaker    . Abdominal hysterectomy    . Biopsy stomach  11/03/10    Soft Tissue Mass, Biopsy, Right Lower Quadrant Mesenteric Mass- High Grade Non-Hodgkins B Cell Lymphoma  with Flow Cytometry  . Bone biopsy  11/24/10    Bone Marrow, Aspirate Biospy, and clot, Left - No Involvement of Non-Hodgkin's Lymphoma Identified  FAMILY HISTORY Family History  Problem Relation Age of Onset  . Stroke Mother   . Stroke Father   . Aneurysm Son     Brain  . Cancer Sister      1 sister had vaginal cancer  . Cancer Brother     Prostate   HEALTH  MAINTENANCE: History  Substance Use Topics  . Smoking status: Current Some Day Smoker -- 1.50 packs/day for 30 years    Types: Cigarettes  . Smokeless tobacco: Never Used  . Alcohol Use: No    Allergies  Allergen Reactions  . Codeine Nausea Only    Current Outpatient Prescriptions  Medication Sig Dispense Refill  . allopurinol (ZYLOPRIM) 300 MG tablet Take 300 mg by mouth daily.      . Cholecalciferol (VITAMIN D3) 2000 UNITS TABS Take 2,000 Units by mouth 2 (two) times daily.       . citalopram (CELEXA) 20 MG tablet Take 20 mg by mouth every morning.       . diltiazem (CARDIZEM CD) 180 MG 24 hr capsule Take 180 mg by mouth every morning.       Marland Kitchen levothyroxine (SYNTHROID, LEVOTHROID) 100 MCG tablet Take 100 mcg by mouth every morning.       . lidocaine-prilocaine (EMLA) cream Apply topically as needed. Apply to port a cath site one hour before needle stick as needed.  30 g  2  . loperamide (IMODIUM) 2 MG capsule Take 2 mg by mouth 4 (four) times daily as needed for diarrhea or loose stools.      . metFORMIN (GLUCOPHAGE) 500 MG tablet Take 1,000 mg by mouth 2 (two) times daily.       . ondansetron (ZOFRAN) 8 MG tablet Take 1 tablet (8 mg total) by mouth every 12 (twelve) hours as needed for nausea.  20 tablet  3  . potassium chloride (K-DUR) 10 MEQ tablet Take 2 tablets (20 mEq total) by mouth once.  60 tablet  0  . pravastatin (PRAVACHOL) 40 MG tablet Take 40 mg by mouth at bedtime.       . prochlorperazine (COMPAZINE) 10 MG tablet Take 1 tablet (10 mg total) by mouth every 6 (six) hours as needed.  30 tablet  3  . promethazine (PHENERGAN) 25 MG suppository Place 1 suppository (25 mg total) rectally every 12 (twelve) hours as needed for nausea.  12 each  0  . warfarin (COUMADIN) 6 MG tablet Take 6 mg by mouth daily. 6 mg on Mon, Wed, Fri, Sat and Sundays.  3 mg on Tues and Thurs      . albuterol (PROVENTIL) (2.5 MG/3ML) 0.083% nebulizer solution Take 2.5 mg by nebulization every 6 (six)  hours as needed.      . magnesium oxide (MAG-OX) 400 (241.3 MG) MG tablet Take 1 tablet (400 mg total) by mouth 2 (two) times daily.  60 tablet  0   No current facility-administered medications for this visit.   Facility-Administered Medications Ordered in Other Visits  Medication Dose Route Frequency Provider Last Rate Last Dose  . sodium chloride 0.9 % injection 10 mL  10 mL Intracatheter PRN Exie Parody, MD   10 mL at 10/26/12 2000    OBJECTIVE: Filed Vitals:   12/15/12 0942  BP: 112/63  Pulse: 78  Temp: 98.1 F (36.7 C)  Resp: 18     Body mass index is 51.24 kg/(m^2).     PHYSICAL EXAMINATION:  HEENT: Sclerae anicteric.  Conjunctivae were pink. Pupils round  and reactive bilaterally. Oral mucosa is moist without ulceration or thrush. No occipital, submandibular, cervical, supraclavicular or axillar adenopathy. Lungs: clear to auscultation without wheezes. No rales or rhonchi. Heart: regular rate and rhythm. No murmur, gallop or rubs. Abdomen: soft, non tender. No guarding or rebound tenderness. Bowel sounds are present. No palpable hepatosplenomegaly. MSK: no focal spinal tenderness. Extremities: No clubbing or cyanosis.No calf tenderness to palpitation, no peripheral edema. The patient had grossly intact strength in upper and lower extremities. Skin exam was without ecchymosis, petechiae. Neuro: non-focal, alert and oriented to time, person and place, appropriate affect  LAB RESULTS:  CMP     Component Value Date/Time   NA 141 12/06/2012 0858   NA 141 10/12/2011 0957   NA 140 07/29/2011 1314   K 4.3 12/06/2012 0858   K 4.4 10/12/2011 0957   K 4.1 07/29/2011 1314   CL 102 09/21/2012 0917   CL 95* 10/12/2011 0957   CL 102 07/29/2011 1314   CO2 25 12/06/2012 0858   CO2 29 10/12/2011 0957   CO2 28 07/29/2011 1314   GLUCOSE 127 12/06/2012 0858   GLUCOSE 161* 09/21/2012 0917   GLUCOSE 131* 10/12/2011 0957   GLUCOSE 87 07/29/2011 1314   BUN 9.9 12/06/2012 0858   BUN 15 10/12/2011 0957   BUN  13 07/29/2011 1314   CREATININE 1.1 12/06/2012 0858   CREATININE 0.9 10/12/2011 0957   CREATININE 0.87 07/29/2011 1314   CALCIUM 9.0 12/06/2012 0858   CALCIUM 9.1 10/12/2011 0957   CALCIUM 9.1 07/29/2011 1314   PROT 6.0* 12/06/2012 0858   PROT 7.2 10/12/2011 0957   PROT 6.4 07/29/2011 1314   ALBUMIN 3.3* 12/06/2012 0858   ALBUMIN 3.9 07/29/2011 1314   AST 32 12/06/2012 0858   AST 19 10/12/2011 0957   AST 16 07/29/2011 1314   ALT 20 12/06/2012 0858   ALT 20 10/12/2011 0957   ALT 14 07/29/2011 1314   ALKPHOS 119 12/06/2012 0858   ALKPHOS 65 10/12/2011 0957   ALKPHOS 63 07/29/2011 1314   BILITOT 0.27 12/06/2012 0858   BILITOT 0.60 10/12/2011 0957   BILITOT 0.3 07/29/2011 1314   GFRNONAA 63* 04/07/2011 0453   GFRAA 73* 04/07/2011 0453    Lab Results  Component Value Date   WBC 1.1* 12/15/2012   NEUTROABS 0.7* 12/15/2012   HGB 7.1* 12/15/2012   HCT 21.9* 12/15/2012   MCV 92.8 12/15/2012   PLT 66* 12/15/2012      Chemistry      Component Value Date/Time   NA 141 12/06/2012 0858   NA 141 10/12/2011 0957   NA 140 07/29/2011 1314   K 4.3 12/06/2012 0858   K 4.4 10/12/2011 0957   K 4.1 07/29/2011 1314   CL 102 09/21/2012 0917   CL 95* 10/12/2011 0957   CL 102 07/29/2011 1314   CO2 25 12/06/2012 0858   CO2 29 10/12/2011 0957   CO2 28 07/29/2011 1314   BUN 9.9 12/06/2012 0858   BUN 15 10/12/2011 0957   BUN 13 07/29/2011 1314   CREATININE 1.1 12/06/2012 0858   CREATININE 0.9 10/12/2011 0957   CREATININE 0.87 07/29/2011 1314      Component Value Date/Time   CALCIUM 9.0 12/06/2012 0858   CALCIUM 9.1 10/12/2011 0957   CALCIUM 9.1 07/29/2011 1314   ALKPHOS 119 12/06/2012 0858   ALKPHOS 65 10/12/2011 0957   ALKPHOS 63 07/29/2011 1314   AST 32 12/06/2012 0858   AST 19 10/12/2011 0957   AST 16 07/29/2011  1314   ALT 20 12/06/2012 0858   ALT 20 10/12/2011 0957   ALT 14 07/29/2011 1314   BILITOT 0.27 12/06/2012 0858   BILITOT 0.60 10/12/2011 0957   BILITOT 0.3 07/29/2011 1314       STUDIES: No results found.  ASSESSMENT AND PLAN: 1. Relapsed  high grade non-Hodgkin's B cell lymphoma.  - Patient that she tolerated treatment well. I do not think that her episodes of vomiting connected to treatment ar magnesium sulfate infusion. In fact patient started to feel not very well after she eat something. - Proceed with 750 mg/m2 of Gemzar treatment today. - Neulasta on day 9 to decrease risk of infection.  * After this cycle we will repeat scan to assess response.  * If and when remission is achieve, we can proceed with autologous bone marrow transplant at [facility] with Dr. Loistine Chance.  - Follow up: in 2 weeks after CT scan and Dr. Loistine Chance consult.  2. Hypothyroidism: Levothyroxine per PCP. 3. Hypomagnesemia. Magnesium sulfate 4 gm IV. Check magnesium on 08/30 and 12/13/12 4. Hypertension: Diltiazem per PCP.  5. Diabetes mellitus, type II: Metformin per PCP.  6. Hyperlipidemia: Pravastatin per PCP.  7. History of atrial fibrillation: Diltiazem, pacemaker. Coumadin being managed per her PCP.     Myra Rude, MD   12/18/2012 6:09 PM

## 2012-12-20 ENCOUNTER — Ambulatory Visit (HOSPITAL_BASED_OUTPATIENT_CLINIC_OR_DEPARTMENT_OTHER): Payer: Medicare Other

## 2012-12-20 ENCOUNTER — Other Ambulatory Visit: Payer: Self-pay

## 2012-12-20 ENCOUNTER — Other Ambulatory Visit (HOSPITAL_BASED_OUTPATIENT_CLINIC_OR_DEPARTMENT_OTHER): Payer: Medicare Other | Admitting: Lab

## 2012-12-20 VITALS — BP 138/62 | HR 75 | Temp 98.5°F | Resp 20

## 2012-12-20 DIAGNOSIS — C8589 Other specified types of non-Hodgkin lymphoma, extranodal and solid organ sites: Secondary | ICD-10-CM

## 2012-12-20 DIAGNOSIS — C8590 Non-Hodgkin lymphoma, unspecified, unspecified site: Secondary | ICD-10-CM

## 2012-12-20 DIAGNOSIS — D649 Anemia, unspecified: Secondary | ICD-10-CM

## 2012-12-20 LAB — CBC WITH DIFFERENTIAL/PLATELET
BASO%: 2.7 % — ABNORMAL HIGH (ref 0.0–2.0)
Basophils Absolute: 0.1 10*3/uL (ref 0.0–0.1)
EOS%: 0 % (ref 0.0–7.0)
HGB: 7.8 g/dL — ABNORMAL LOW (ref 11.6–15.9)
MCH: 29.8 pg (ref 25.1–34.0)
MCHC: 32.2 g/dL (ref 31.5–36.0)
MCV: 92.4 fL (ref 79.5–101.0)
MONO%: 8.8 % (ref 0.0–14.0)
RDW: 17.1 % — ABNORMAL HIGH (ref 11.2–14.5)

## 2012-12-20 MED ORDER — ACETAMINOPHEN 325 MG PO TABS
ORAL_TABLET | ORAL | Status: AC
  Filled 2012-12-20: qty 2

## 2012-12-20 MED ORDER — SODIUM CHLORIDE 0.9 % IV SOLN: 250.0000 mL | Freq: Once | INTRAVENOUS | Status: DC

## 2012-12-20 MED ORDER — HEPARIN SOD (PORK) LOCK FLUSH 100 UNIT/ML IV SOLN
500.0000 [IU] | Freq: Every day | INTRAVENOUS | Status: DC | PRN
  Filled 2012-12-20: qty 5

## 2012-12-20 MED ORDER — SODIUM CHLORIDE 0.9 % IV SOLN
250.0000 mL | Freq: Once | INTRAVENOUS | Status: AC
  Administered 2012-12-20: 250 mL via INTRAVENOUS

## 2012-12-20 MED ORDER — ACETAMINOPHEN 325 MG PO TABS
650.0000 mg | ORAL_TABLET | Freq: Once | ORAL | Status: AC
  Administered 2012-12-20: 650 mg via ORAL

## 2012-12-20 MED ORDER — DIPHENHYDRAMINE HCL 25 MG PO CAPS
25.0000 mg | ORAL_CAPSULE | Freq: Once | ORAL | Status: AC
  Administered 2012-12-20: 25 mg via ORAL

## 2012-12-20 MED ORDER — DIPHENHYDRAMINE HCL 25 MG PO CAPS
ORAL_CAPSULE | ORAL | Status: AC
  Administered 2012-12-20: 25 mg
  Filled 2012-12-20: qty 1

## 2012-12-20 MED ORDER — SODIUM CHLORIDE 0.9 % IJ SOLN
10.0000 mL | INTRAMUSCULAR | Status: DC | PRN
  Filled 2012-12-20: qty 10

## 2012-12-21 LAB — PREPARE PLATELET PHERESIS

## 2012-12-23 ENCOUNTER — Ambulatory Visit (HOSPITAL_BASED_OUTPATIENT_CLINIC_OR_DEPARTMENT_OTHER): Payer: Medicare Other

## 2012-12-23 ENCOUNTER — Telehealth: Payer: Self-pay | Admitting: *Deleted

## 2012-12-23 ENCOUNTER — Encounter (HOSPITAL_COMMUNITY): Payer: Self-pay

## 2012-12-23 ENCOUNTER — Other Ambulatory Visit (HOSPITAL_BASED_OUTPATIENT_CLINIC_OR_DEPARTMENT_OTHER): Payer: Medicare Other | Admitting: Lab

## 2012-12-23 ENCOUNTER — Ambulatory Visit (HOSPITAL_COMMUNITY)
Admission: RE | Admit: 2012-12-23 | Discharge: 2012-12-23 | Disposition: A | Discharge status: Home / Self Care | Payer: Medicare Other | Source: Ambulatory Visit | Attending: Hematology and Oncology | Admitting: Hematology and Oncology

## 2012-12-23 ENCOUNTER — Other Ambulatory Visit: Payer: Self-pay | Admitting: *Deleted

## 2012-12-23 VITALS — BP 114/61 | HR 91 | Temp 98.0°F | Resp 18

## 2012-12-23 DIAGNOSIS — C8589 Other specified types of non-Hodgkin lymphoma, extranodal and solid organ sites: Secondary | ICD-10-CM | POA: Insufficient documentation

## 2012-12-23 DIAGNOSIS — C8590 Non-Hodgkin lymphoma, unspecified, unspecified site: Secondary | ICD-10-CM

## 2012-12-23 DIAGNOSIS — Z923 Personal history of irradiation: Secondary | ICD-10-CM | POA: Insufficient documentation

## 2012-12-23 DIAGNOSIS — K449 Diaphragmatic hernia without obstruction or gangrene: Secondary | ICD-10-CM | POA: Insufficient documentation

## 2012-12-23 DIAGNOSIS — R0602 Shortness of breath: Secondary | ICD-10-CM | POA: Insufficient documentation

## 2012-12-23 DIAGNOSIS — K5730 Diverticulosis of large intestine without perforation or abscess without bleeding: Secondary | ICD-10-CM | POA: Insufficient documentation

## 2012-12-23 DIAGNOSIS — Z9221 Personal history of antineoplastic chemotherapy: Secondary | ICD-10-CM | POA: Insufficient documentation

## 2012-12-23 DIAGNOSIS — I517 Cardiomegaly: Secondary | ICD-10-CM | POA: Insufficient documentation

## 2012-12-23 DIAGNOSIS — R599 Enlarged lymph nodes, unspecified: Secondary | ICD-10-CM | POA: Insufficient documentation

## 2012-12-23 LAB — CBC WITH DIFFERENTIAL/PLATELET
BASO%: 0.9 % (ref 0.0–2.0)
EOS%: 0.4 % (ref 0.0–7.0)
Eosinophils Absolute: 0.1 10*3/uL (ref 0.0–0.5)
LYMPH%: 7.2 % — ABNORMAL LOW (ref 14.0–49.7)
MCH: 30.2 pg (ref 25.1–34.0)
MCHC: 32.9 g/dL (ref 31.5–36.0)
MCV: 91.6 fL (ref 79.5–101.0)
MONO%: 12.1 % (ref 0.0–14.0)
Platelets: 20 10*3/uL — ABNORMAL LOW (ref 145–400)
RBC: 2.49 10*6/uL — ABNORMAL LOW (ref 3.70–5.45)

## 2012-12-23 LAB — MAGNESIUM (CC13): Magnesium: 1.3 mg/dl — CL (ref 1.5–2.5)

## 2012-12-23 MED ORDER — IOHEXOL 300 MG/ML  SOLN
100.0000 mL | Freq: Once | INTRAMUSCULAR | Status: AC | PRN
  Administered 2012-12-23: 100 mL via INTRAVENOUS

## 2012-12-23 MED ORDER — IOHEXOL 300 MG/ML  SOLN
50.0000 mL | Freq: Once | INTRAMUSCULAR | Status: AC | PRN
  Administered 2012-12-23: 50 mL via ORAL

## 2012-12-23 MED ORDER — SODIUM CHLORIDE 0.9 % IV SOLN
2.0000 g | Freq: Once | INTRAVENOUS | Status: AC
  Administered 2012-12-23: 2 g via INTRAVENOUS
  Filled 2012-12-23: qty 4

## 2012-12-23 MED ORDER — HEPARIN SOD (PORK) LOCK FLUSH 100 UNIT/ML IV SOLN
500.0000 [IU] | Freq: Once | INTRAVENOUS | Status: AC
  Administered 2012-12-23: 500 [IU] via INTRAVENOUS
  Filled 2012-12-23: qty 5

## 2012-12-23 MED ORDER — SODIUM CHLORIDE 0.9 % IJ SOLN
10.0000 mL | INTRAMUSCULAR | Status: DC | PRN
  Administered 2012-12-23: 10 mL via INTRAVENOUS
  Filled 2012-12-23: qty 10

## 2012-12-23 NOTE — Patient Instructions (Signed)
Hypomagnesemia Magnesium is a common ion (mineral) in the body which is needed for metabolism. It is about how the body handles food and other chemical reactions necessary for life. Only about 2% of the magnesium in our body is found in the blood. When this is low, it is called hypomagnesemia. The blood will measure only a tiny amount of the magnesium in our body. When it is low in our blood, it does not mean that the whole body supply is low. The normal serum concentration ranges from 1.8-2.5 mEq/L. When the level gets to be less than 1.0 mEq/L, a number of problems begin to happen.  CAUSES   Receiving intravenous fluids without magnesium replacement.  Loss of magnesium from the bowel by naso-gastric suction.  Loss of magnesium from nausea and vomiting or severe diarrhea. Any of the inflammatory bowel conditions can cause this.  Abuse of alcohol often leads to low serum magnesium.  An inherited form of magnesium loss happens when the kidneys lose magnesium. This is called familial or primary hypomagnesemia.  Some medications such as diuretics also cause the loss of magnesium. SYMPTOMS  These following problems are worse if the changes in magnesium levels come on suddenly.  Tremor.  Confusion.  Muscle weakness.  Over-sensitive to sights and sounds.  Sensitive reflexes.  Depression.  Muscular fibrillations.  Over-reactivity of the nerves.  Irritability.  Psychosis.  Spasms of the hand muscles.  Tetany (where the muscles go into uncontrollable spasms). DIAGNOSIS  This condition can be diagnosed by blood tests. TREATMENT   In emergency, magnesium can be given intravenously (by vein).  If the condition is less worrisome, it can be corrected by diet. High levels of magnesium are found in green leafy vegetables, peas, beans and nuts among other things. It can also be given through medications by mouth.  If it is being caused by medications, changes can be made.  If  alcohol is a problem, help is available if there are difficulties giving it up. Document Released: 12/24/2004 Document Revised: 06/22/2011 Document Reviewed: 11/18/2007 Exit[employer] Patient Information 2014 [employer].        

## 2012-12-23 NOTE — Telephone Encounter (Signed)
Dr. Alecia Lemming ordered 2 gm IV Mag today for Mag level 1.3.   Informed pt's niece, Tyler Aas, of new order.  She states pt still over in CT scan waiting for her appt..  Should be done about 12:30 or 1 pm.   Called CT dept and they will bring pt over to [facility] to check in for infusion when CT is complete.

## 2012-12-26 ENCOUNTER — Other Ambulatory Visit: Payer: Self-pay | Admitting: Hematology and Oncology

## 2012-12-26 DIAGNOSIS — C8330 Diffuse large B-cell lymphoma, unspecified site: Secondary | ICD-10-CM

## 2012-12-27 ENCOUNTER — Ambulatory Visit (HOSPITAL_BASED_OUTPATIENT_CLINIC_OR_DEPARTMENT_OTHER): Payer: Medicare Other

## 2012-12-27 ENCOUNTER — Other Ambulatory Visit: Payer: Self-pay

## 2012-12-27 ENCOUNTER — Emergency Department (HOSPITAL_COMMUNITY): Payer: Medicare Other

## 2012-12-27 ENCOUNTER — Encounter (HOSPITAL_COMMUNITY): Payer: Self-pay | Admitting: Emergency Medicine

## 2012-12-27 ENCOUNTER — Other Ambulatory Visit (HOSPITAL_BASED_OUTPATIENT_CLINIC_OR_DEPARTMENT_OTHER): Payer: Medicare Other | Admitting: Lab

## 2012-12-27 ENCOUNTER — Emergency Department (HOSPITAL_COMMUNITY)
Admission: EM | Admit: 2012-12-27 | Discharge: 2012-12-27 | Disposition: A | Discharge status: Home / Self Care | Payer: Medicare Other | Attending: Emergency Medicine | Admitting: Emergency Medicine

## 2012-12-27 VITALS — BP 131/75 | HR 77 | Temp 97.1°F | Resp 18

## 2012-12-27 DIAGNOSIS — C8589 Other specified types of non-Hodgkin lymphoma, extranodal and solid organ sites: Secondary | ICD-10-CM

## 2012-12-27 DIAGNOSIS — S80212A Abrasion, left knee, initial encounter: Secondary | ICD-10-CM

## 2012-12-27 DIAGNOSIS — F17200 Nicotine dependence, unspecified, uncomplicated: Secondary | ICD-10-CM | POA: Insufficient documentation

## 2012-12-27 DIAGNOSIS — E119 Type 2 diabetes mellitus without complications: Secondary | ICD-10-CM | POA: Insufficient documentation

## 2012-12-27 DIAGNOSIS — IMO0002 Reserved for concepts with insufficient information to code with codable children: Secondary | ICD-10-CM | POA: Insufficient documentation

## 2012-12-27 DIAGNOSIS — S0180XA Unspecified open wound of other part of head, initial encounter: Secondary | ICD-10-CM | POA: Insufficient documentation

## 2012-12-27 DIAGNOSIS — Z79899 Other long term (current) drug therapy: Secondary | ICD-10-CM | POA: Insufficient documentation

## 2012-12-27 DIAGNOSIS — Y9389 Activity, other specified: Secondary | ICD-10-CM | POA: Insufficient documentation

## 2012-12-27 DIAGNOSIS — Z23 Encounter for immunization: Secondary | ICD-10-CM | POA: Insufficient documentation

## 2012-12-27 DIAGNOSIS — C8590 Non-Hodgkin lymphoma, unspecified, unspecified site: Secondary | ICD-10-CM

## 2012-12-27 DIAGNOSIS — Z950 Presence of cardiac pacemaker: Secondary | ICD-10-CM | POA: Insufficient documentation

## 2012-12-27 DIAGNOSIS — Z8739 Personal history of other diseases of the musculoskeletal system and connective tissue: Secondary | ICD-10-CM | POA: Insufficient documentation

## 2012-12-27 DIAGNOSIS — E039 Hypothyroidism, unspecified: Secondary | ICD-10-CM | POA: Insufficient documentation

## 2012-12-27 DIAGNOSIS — S0181XA Laceration without foreign body of other part of head, initial encounter: Secondary | ICD-10-CM

## 2012-12-27 DIAGNOSIS — I4891 Unspecified atrial fibrillation: Secondary | ICD-10-CM | POA: Insufficient documentation

## 2012-12-27 DIAGNOSIS — W19XXXA Unspecified fall, initial encounter: Secondary | ICD-10-CM

## 2012-12-27 DIAGNOSIS — W010XXA Fall on same level from slipping, tripping and stumbling without subsequent striking against object, initial encounter: Secondary | ICD-10-CM | POA: Insufficient documentation

## 2012-12-27 DIAGNOSIS — C8330 Diffuse large B-cell lymphoma, unspecified site: Secondary | ICD-10-CM

## 2012-12-27 DIAGNOSIS — E785 Hyperlipidemia, unspecified: Secondary | ICD-10-CM | POA: Insufficient documentation

## 2012-12-27 DIAGNOSIS — Z87898 Personal history of other specified conditions: Secondary | ICD-10-CM | POA: Insufficient documentation

## 2012-12-27 DIAGNOSIS — Z923 Personal history of irradiation: Secondary | ICD-10-CM | POA: Insufficient documentation

## 2012-12-27 DIAGNOSIS — Z9221 Personal history of antineoplastic chemotherapy: Secondary | ICD-10-CM | POA: Insufficient documentation

## 2012-12-27 DIAGNOSIS — D649 Anemia, unspecified: Secondary | ICD-10-CM

## 2012-12-27 DIAGNOSIS — Y929 Unspecified place or not applicable: Secondary | ICD-10-CM | POA: Insufficient documentation

## 2012-12-27 LAB — COMPREHENSIVE METABOLIC PANEL (CC13)
ALT: 13 U/L (ref 0–55)
Albumin: 3.5 g/dL (ref 3.5–5.0)
Alkaline Phosphatase: 129 U/L (ref 40–150)
Glucose: 106 mg/dl (ref 70–140)
Potassium: 4.3 mEq/L (ref 3.5–5.1)
Sodium: 140 mEq/L (ref 136–145)
Total Protein: 5.9 g/dL — ABNORMAL LOW (ref 6.4–8.3)

## 2012-12-27 LAB — CBC & DIFF AND RETIC
BASO%: 0.4 % (ref 0.0–2.0)
Basophils Absolute: 0.1 10*3/uL (ref 0.0–0.1)
EOS%: 0.8 % (ref 0.0–7.0)
HCT: 22.3 % — ABNORMAL LOW (ref 34.8–46.6)
HGB: 7.2 g/dL — ABNORMAL LOW (ref 11.6–15.9)
Immature Retic Fract: 43.8 % — ABNORMAL HIGH (ref 1.60–10.00)
MCH: 30.9 pg (ref 25.1–34.0)
MONO#: 3.8 10*3/uL — ABNORMAL HIGH (ref 0.1–0.9)
NEUT%: 82.1 % — ABNORMAL HIGH (ref 38.4–76.8)
RDW: 18.5 % — ABNORMAL HIGH (ref 11.2–14.5)
Retic %: 8.66 % — ABNORMAL HIGH (ref 0.70–2.10)
Retic Ct Abs: 201.78 10*3/uL — ABNORMAL HIGH (ref 33.70–90.70)
WBC: 29 10*3/uL — ABNORMAL HIGH (ref 3.9–10.3)
lymph#: 1 10*3/uL (ref 0.9–3.3)

## 2012-12-27 LAB — MAGNESIUM (CC13): Magnesium: 1.7 mg/dl (ref 1.5–2.5)

## 2012-12-27 LAB — LACTATE DEHYDROGENASE (CC13): LDH: 710 U/L — ABNORMAL HIGH (ref 125–245)

## 2012-12-27 LAB — HOLD TUBE, BLOOD BANK

## 2012-12-27 LAB — PREPARE RBC (CROSSMATCH)

## 2012-12-27 MED ORDER — LIDOCAINE-EPINEPHRINE 2 %-1:100000 IJ SOLN
20.0000 mL | Freq: Once | INTRAMUSCULAR | Status: AC
  Administered 2012-12-27: 20 mL
  Filled 2012-12-27: qty 20

## 2012-12-27 MED ORDER — TETANUS-DIPHTH-ACELL PERTUSSIS 5-2.5-18.5 LF-MCG/0.5 IM SUSP
0.5000 mL | Freq: Once | INTRAMUSCULAR | Status: AC
  Administered 2012-12-27: 0.5 mL via INTRAMUSCULAR
  Filled 2012-12-27: qty 0.5

## 2012-12-27 MED ORDER — HEPARIN SOD (PORK) LOCK FLUSH 100 UNIT/ML IV SOLN
250.0000 [IU] | INTRAVENOUS | Status: DC | PRN
  Filled 2012-12-27: qty 5

## 2012-12-27 MED ORDER — SODIUM CHLORIDE 0.9 % IV SOLN
250.0000 mL | Freq: Once | INTRAVENOUS | Status: AC
  Administered 2012-12-27: 250 mL via INTRAVENOUS

## 2012-12-27 MED ORDER — DIPHENHYDRAMINE HCL 25 MG PO CAPS
ORAL_CAPSULE | ORAL | Status: AC
  Filled 2012-12-27: qty 1

## 2012-12-27 MED ORDER — SODIUM CHLORIDE 0.9 % IJ SOLN
3.0000 mL | INTRAMUSCULAR | Status: DC | PRN
  Filled 2012-12-27: qty 10

## 2012-12-27 MED ORDER — DIPHENHYDRAMINE HCL 25 MG PO CAPS
25.0000 mg | ORAL_CAPSULE | Freq: Once | ORAL | Status: AC
  Administered 2012-12-27: 25 mg via ORAL

## 2012-12-27 MED ORDER — ACETAMINOPHEN 325 MG PO TABS
650.0000 mg | ORAL_TABLET | Freq: Once | ORAL | Status: AC
  Administered 2012-12-27: 650 mg via ORAL

## 2012-12-27 MED ORDER — SODIUM CHLORIDE 0.9 % IJ SOLN
10.0000 mL | INTRAMUSCULAR | Status: AC | PRN
  Administered 2012-12-27: 10 mL
  Filled 2012-12-27: qty 10

## 2012-12-27 MED ORDER — HEPARIN SOD (PORK) LOCK FLUSH 100 UNIT/ML IV SOLN
500.0000 [IU] | Freq: Once | INTRAVENOUS | Status: AC
  Administered 2012-12-27: 500 [IU] via INTRAVENOUS
  Filled 2012-12-27: qty 5

## 2012-12-27 MED ORDER — ACETAMINOPHEN 325 MG PO TABS
ORAL_TABLET | ORAL | Status: AC
  Filled 2012-12-27: qty 2

## 2012-12-27 NOTE — ED Notes (Signed)
Patient fell getting out of car this evening, landing on bilat knees and hit head on ground.  No LOC, full recall of incident.  Patient is on coumadin.  She is a [facility] patient.

## 2012-12-27 NOTE — ED Provider Notes (Signed)
CSN: [id number]     Arrival date & time 12/27/12  1928 History   First MD Initiated Contact with Patient 12/27/12 1930     Chief Complaint  Patient presents with  . Fall   (Consider location/radiation/quality/duration/timing/severity/associated sxs/prior Treatment) HPI Comments: The patient fell getting out of her vehicle. She reports she tripped over her foot. She uses a walker to ambulate at baseline. She had no preceding symptoms including no shortness of breath, no chest pain, no palpitations, no headache, no dizziness. She fell forward onto her knees and struck her poor head on the ground.  She landed in grass. No LOC. Patient denies headache, neck pain, back pain, chest pain, abdominal pain, hip pain, arm pain and right leg pain. She does endorse mild left knee pain.  Patient is a 72 y.o. female presenting with fall. The history is provided by the patient.  Fall This is a new problem. The current episode started today. Episode frequency: Once. The problem has been resolved. Pertinent negatives include no abdominal pain, chest pain, chills, fever, headaches, neck pain or weakness. She has tried nothing for the symptoms.    Past Medical History  Diagnosis Date  . Sick sinus syndrome   . Hematoma     At the site of the pacemaker insertion.  . Hypotension   . Dyslipidemia   . Diabetes mellitus   . Pacemaker   . History of atrial fibrillation   . Arthritis     Osteoarthritis  . Hypothyroidism   . Port-a-cath in place 12/08/10  . Status post chemotherapy completed 03/2011    R - [facility] q 3 weeks x 6  . S/P radiation therapy 05/28/2011 - 06/26/2011    Right Abdomen and Right Pelvis/3060 cGy in 17 Fractions  . Diffuse large B cell lymphoma   . Cancer 10/2010    large B cell lymphoma  . 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 Pacemaker insertion      Lead revision completed August 04, 2006  .  Cholecystectomy    . Tubal ligation      Bilateral  . Insert / replace / remove pacemaker    . Abdominal hysterectomy    . Biopsy stomach  11/03/10    Soft Tissue Mass, Biopsy, Right Lower Quadrant Mesenteric Mass- High Grade Non-Hodgkins B Cell Lymphoma  with Flow Cytometry  . Bone biopsy  11/24/10    Bone Marrow, Aspirate Biospy, and clot, Left - No Involvement of Non-Hodgkin's Lymphoma Identified   Family History  Problem Relation Age of Onset  . Stroke Mother   . Stroke Father   . Aneurysm Son     Brain  . Cancer Sister      1 sister had vaginal cancer  . Cancer Brother     Prostate   History  Substance Use Topics  . Smoking status: Current Some Day Smoker -- 1.50 packs/day for 30 years    Types: Cigarettes  . Smokeless tobacco: Never Used  . Alcohol Use: No   OB History   Grav Para Term Preterm Abortions TAB SAB Ect Mult Living                 Review of Systems  Constitutional: Negative for fever and chills.  HENT: Negative for neck pain.   Respiratory: Negative for chest tightness and shortness of breath.   Cardiovascular: Negative for chest pain and palpitations.  Gastrointestinal: Negative  for abdominal pain.  Musculoskeletal: Negative for back pain.  Neurological: Negative for syncope, weakness and headaches.  All other systems reviewed and are negative.    Allergies  Codeine  Home Medications   Current Outpatient Rx  Name  Route  Sig  Dispense  Refill  . albuterol (PROVENTIL) (2.5 MG/3ML) 0.083% nebulizer solution   Nebulization   Take 2.5 mg by nebulization every 6 (six) hours as needed.         Marland Kitchen allopurinol (ZYLOPRIM) 300 MG tablet   Oral   Take 300 mg by mouth daily.         . Cholecalciferol (VITAMIN D3) 2000 UNITS TABS   Oral   Take 2,000 Units by mouth 2 (two) times daily.          . citalopram (CELEXA) 20 MG tablet   Oral   Take 20 mg by mouth every morning.          . diltiazem (CARDIZEM CD) 180 MG 24 hr capsule   Oral    Take 180 mg by mouth every morning.          Marland Kitchen levothyroxine (SYNTHROID, LEVOTHROID) 100 MCG tablet   Oral   Take 100 mcg by mouth every morning.          . lidocaine-prilocaine (EMLA) cream   Topical   Apply topically as needed. Apply to port a cath site one hour before needle stick as needed.   30 g   2   . loperamide (IMODIUM) 2 MG capsule   Oral   Take 2 mg by mouth 4 (four) times daily as needed for diarrhea or loose stools.         . magnesium oxide (MAG-OX) 400 (241.3 MG) MG tablet   Oral   Take 1 tablet (400 mg total) by mouth 2 (two) times daily.   60 tablet   0   . metFORMIN (GLUCOPHAGE) 500 MG tablet   Oral   Take 1,000 mg by mouth 2 (two) times daily.          . ondansetron (ZOFRAN) 8 MG tablet   Oral   Take 1 tablet (8 mg total) by mouth every 12 (twelve) hours as needed for nausea.   20 tablet   3   . potassium chloride (K-DUR) 10 MEQ tablet   Oral   Take 2 tablets (20 mEq total) by mouth once.   60 tablet   0     Dispense as written.   . pravastatin (PRAVACHOL) 40 MG tablet   Oral   Take 40 mg by mouth at bedtime.          . prochlorperazine (COMPAZINE) 10 MG tablet   Oral   Take 1 tablet (10 mg total) by mouth every 6 (six) hours as needed.   30 tablet   3   . promethazine (PHENERGAN) 25 MG suppository   Rectal   Place 1 suppository (25 mg total) rectally every 12 (twelve) hours as needed for nausea.   12 each   0   . warfarin (COUMADIN) 6 MG tablet   Oral   Take 6 mg by mouth daily. 6 mg on Mon, Wed, Fri, Sat and Sundays.  3 mg on Tues and Thurs          BP 131/68  Pulse 80  Temp(Src) 97.9 F (36.6 C) (Oral)  Resp 20  SpO2 100% Physical Exam  Vitals reviewed. Constitutional: She is oriented to person, place, and time.  She appears well-developed and well-nourished.  HENT:  Head:    Right Ear: External ear normal.  Left Ear: External ear normal.  Mouth/Throat: No oropharyngeal exudate.  4 cm midline forehead  laceration  Eyes: Conjunctivae and EOM are normal. Pupils are equal, round, and reactive to light.  Neck: Normal range of motion. Neck supple.  No midline C-spine tenderness  Cardiovascular: Normal rate, regular rhythm, normal heart sounds and intact distal pulses.  Exam reveals no gallop and no friction rub.   No murmur heard. Pulmonary/Chest: Effort normal and breath sounds normal.  Abdominal: Soft. Bowel sounds are normal. She exhibits no distension. There is no tenderness.  Musculoskeletal: Normal range of motion. She exhibits no edema.  No lumbar or T-spine tenderness Mild tenderness to palpation of her left knee She is able to move her legs equally, but range of motion limited due to habitus  Neurological: She is alert and oriented to person, place, and time. She has normal strength. No cranial nerve deficit or sensory deficit. GCS eye subscore is 4. GCS verbal subscore is 5. GCS motor subscore is 6.  Skin: Skin is warm and dry. No rash noted.  Small abrasion overlying the left knee  Psychiatric: She has a normal mood and affect.    ED Course  LACERATION REPAIR Performed by: Oleh Genin Authorized by: Gilda Crease Consent: Verbal consent obtained. Risks and benefits: risks, benefits and alternatives were discussed Consent given by: patient Patient understanding: patient states understanding of the procedure being performed Patient consent: the patient's understanding of the procedure matches consent given Procedure consent: procedure consent matches procedure scheduled Relevant documents: relevant documents present and verified Imaging studies: imaging studies available Required items: required blood products, implants, devices, and special equipment available Patient identity confirmed: verbally with patient Time out: Immediately prior to procedure a "time out" was called to verify the correct patient, procedure, equipment, support staff and site/side marked as  required. Body area: head/neck Location details: forehead Laceration length: 4 cm Foreign bodies: no foreign bodies Tendon involvement: none Nerve involvement: none Vascular damage: no Anesthesia: local infiltration Local anesthetic: lidocaine 2% without epinephrine Anesthetic total: 2 ml Preparation: Patient was prepped and draped in the usual sterile fashion. Irrigation solution: saline Irrigation method: syringe Amount of cleaning: standard Debridement: none Degree of undermining: none Wound skin closure material used: 5-0 Vicryl Rapide. Number of sutures: 5 Technique: simple Approximation: close Approximation difficulty: simple Dressing: antibiotic ointment Patient tolerance: Patient tolerated the procedure well with no immediate complications.   (including critical care time) Labs Review Labs Reviewed - No data to display Imaging Review Dg Knee 2 Views Left  12/27/2012   *RADIOLOGY REPORT*  Clinical Data: Fall, anterior left knee pain  LEFT KNEE - 1-2 VIEW  Comparison: Prior radiographs of the left knee 05/18/2004  Findings: Advanced tricompartmental degenerative osteoarthritis with bone on bone contact in the medial compartment.  No acute fracture or malalignment identified.  The bones are diffusely osteopenic.  No suprapatellar joint effusion.  Overall, the degree of degenerative change has not significantly progressed compared to 2006.  IMPRESSION:  1.  No acute fracture or malalignment. 2.  Advanced tricompartmental osteoarthritis without significant interval progression compared to February 2006.   Original Report Authenticated By: Malachy Moan, M.D.   Ct Head Wo Contrast  12/27/2012   CLINICAL DATA:  Fall  EXAM: CT HEAD WITHOUT CONTRAST  TECHNIQUE: Contiguous axial images were obtained from the base of the skull through the vertex without intravenous contrast.  COMPARISON:  08/20/2011  FINDINGS: No skull fracture is noted. Paranasal sinuses and mastoid air cells  relative bike level. No intracranial hemorrhage, mass effect or midline shift. There is mild scalp swelling midline frontal region. See axial image 9.  Mild cerebral atrophy. Mild periventricular white matter decreased attenuation probable due to chronic small vessel ischemic changes. No acute cortical infarction. No mass lesion is noted on this unenhanced scan.  IMPRESSION: No acute intracranial abnormality. Mild scalp swelling frontal region supraorbital. Mild cerebral atrophy.   Electronically Signed   By: Natasha Mead   On: 12/27/2012 20:39    MDM   72 year old female with a past medical history of sick sinus syndrome status post pacemaker, diabetes, A. fib on Coumadin, hypothyroidism, diffuse large B-cell lymphoma, status post cholecystectomy, status post hysterectomy here after a mechanical fall this evening. This was witnessed by the family, no LOC. She fell forward onto her knees and struck her forehead on the ground. Exam as above. Approximately 3 cm laceration to the midline forehead. Nonfocal neuro exam. A&O x4.  CT head given Coumadin and age.  Will repair the laceration. Her family expressed some concerns over their ability to care for her at home. Social work was consulted.  10:05 PM Laceration.repaired as above.  The case manager has provided the family with resources for home care.  It is felt patient is stable for discharge at this time. Return precautions were reviewed with the patient and family. Wound care was discussed. Signs of infection were reviewed.  All questions answered.  Clinical Impression: 1. Fall from standing, initial encounter   2. Forehead laceration, initial encounter   3. Knee abrasion, left, initial encounter     Disposition: Discharge  Condition: Good  I have discussed the results, Dx and Tx plan. They expressed understanding and agree with the plan and were told to return to ED with any worsening of condition or concern.    Discharge Medication List as of  12/27/2012 10:00 PM      Follow Up: Lucky Cowboy, MD [address] [place] [address] [phone]   As needed   Pt seen in conjunction with Dr. Blinda Leatherwood.  Reine Just. Beverely Pace, MD Emergency Medicine PGY-III [phone]    Oleh Genin, MD 12/28/12 [phone]

## 2012-12-27 NOTE — Patient Instructions (Addendum)
Blood Transfusion Information WHAT IS A BLOOD TRANSFUSION? A transfusion is the replacement of blood or some of its parts. Blood is made up of multiple cells which provide different functions.  Red blood cells carry oxygen and are used for blood loss replacement.  White blood cells fight against infection.  Platelets control bleeding.  Plasma helps clot blood.  Other blood products are available for specialized needs, such as hemophilia or other clotting disorders. BEFORE THE TRANSFUSION  Who gives blood for transfusions?   You may be able to donate blood to be used at a later date on yourself (autologous donation).  Relatives can be asked to donate blood. This is generally not any safer than if you have received blood from a stranger. The same precautions are taken to ensure safety when a relative's blood is donated.  Healthy volunteers who are fully evaluated to make sure their blood is safe. This is blood bank blood. Transfusion therapy is the safest it has ever been in the practice of medicine. Before blood is taken from a donor, a complete history is taken to make sure that person has no history of diseases nor engages in risky social behavior (examples are intravenous drug use or sexual activity with multiple partners). The donor's travel history is screened to minimize risk of transmitting infections, such as malaria. The donated blood is tested for signs of infectious diseases, such as HIV and hepatitis. The blood is then tested to be sure it is compatible with you in order to minimize the chance of a transfusion reaction. If you or a relative donates blood, this is often done in anticipation of surgery and is not appropriate for emergency situations. It takes many days to process the donated blood. RISKS AND COMPLICATIONS Although transfusion therapy is very safe and saves many lives, the main dangers of transfusion include:   Getting an infectious disease.  Developing a  transfusion reaction. This is an allergic reaction to something in the blood you were given. Every precaution is taken to prevent this. The decision to have a blood transfusion has been considered carefully by your caregiver before blood is given. Blood is not given unless the benefits outweigh the risks. AFTER THE TRANSFUSION  Right after receiving a blood transfusion, you will usually feel much better and more energetic. This is especially true if your red blood cells have gotten low (anemic). The transfusion raises the level of the red blood cells which carry oxygen, and this usually causes an energy increase.  The nurse administering the transfusion will monitor you carefully for complications. HOME CARE INSTRUCTIONS  No special instructions are needed after a transfusion. You may find your energy is better. Speak with your caregiver about any limitations on activity for underlying diseases you may have. SEEK MEDICAL CARE IF:   Your condition is not improving after your transfusion.  You develop redness or irritation at the intravenous (IV) site. SEEK IMMEDIATE MEDICAL CARE IF:  Any of the following symptoms occur over the next 12 hours:  Shaking chills.  You have a temperature by mouth above 102 F (38.9 C), not controlled by medicine.  Chest, back, or muscle pain.  People around you feel you are not acting correctly or are confused.  Shortness of breath or difficulty breathing.  Dizziness and fainting.  You get a rash or develop hives.  You have a decrease in urine output.  Your urine turns a dark color or changes to pink, red, or brown. Any of the following   symptoms occur over the next 10 days:  You have a temperature by mouth above 102 F (38.9 C), not controlled by medicine.  Shortness of breath.  Weakness after normal activity.  The white part of the eye turns yellow (jaundice).  You have a decrease in the amount of urine or are urinating less often.  Your  urine turns a dark color or changes to pink, red, or brown. Document Released: 03/27/2000 Document Revised: 06/22/2011 Document Reviewed: 11/14/2007 Exit[employer] Patient Information 2014 [employer].  

## 2012-12-27 NOTE — Progress Notes (Signed)
ED CM received consult from CSW for patient desiring HH. Pt presented to ED s/p Fall. In to speak with patient and niece POA [name] [phone].Pt lives at home and son Stays with her sometimes. Explained home care and the services to patient and family. Offered choice to patient and family. Pt preference is [facility]. Referral placed by fax for RN,PT,OT, and SW.  Pt in agreement with plan. Pt has rolling walker, scooter, and 3:1 chair at home. Contact Lenise Arena POA [phone] (c) or [phone] (h) will be at home with patient. Pt and family made aware that [facility] will contact them with 24-48 hours. Instructed patient and family to contact [facility] at [phone] if they are not contacted within 48 hours. Pt and family verbalized understanding  [facility] brochures given to patient and family. Pt verbalizes understanding. No further CM needs identified.

## 2012-12-28 ENCOUNTER — Telehealth: Payer: Self-pay | Admitting: *Deleted

## 2012-12-28 LAB — TYPE AND SCREEN
ABO/RH(D): B POS
Unit division: 0

## 2012-12-28 NOTE — Telephone Encounter (Signed)
Call from BMT MD at [facility] for update on pt's treatment and plan of care.  Reviewed chart w/ physician and updated on recent treatments and scans.  Pt will be seen at [facility] tomorrow and then f/u appt here the next day w/ Dr. Rosie Fate.

## 2012-12-29 ENCOUNTER — Other Ambulatory Visit: Payer: Self-pay

## 2012-12-29 DIAGNOSIS — C8330 Diffuse large B-cell lymphoma, unspecified site: Secondary | ICD-10-CM

## 2012-12-29 DIAGNOSIS — C8590 Non-Hodgkin lymphoma, unspecified, unspecified site: Secondary | ICD-10-CM

## 2012-12-30 ENCOUNTER — Other Ambulatory Visit: Payer: Self-pay | Admitting: Hematology and Oncology

## 2012-12-30 ENCOUNTER — Telehealth: Payer: Self-pay

## 2012-12-30 ENCOUNTER — Ambulatory Visit (HOSPITAL_BASED_OUTPATIENT_CLINIC_OR_DEPARTMENT_OTHER): Payer: Medicare Other | Admitting: Internal Medicine

## 2012-12-30 ENCOUNTER — Other Ambulatory Visit (HOSPITAL_BASED_OUTPATIENT_CLINIC_OR_DEPARTMENT_OTHER): Payer: Medicare Other | Admitting: Lab

## 2012-12-30 VITALS — BP 125/73 | HR 118 | Temp 97.9°F | Resp 20 | Ht <= 58 in | Wt 236.2 lb

## 2012-12-30 DIAGNOSIS — E119 Type 2 diabetes mellitus without complications: Secondary | ICD-10-CM

## 2012-12-30 DIAGNOSIS — C8590 Non-Hodgkin lymphoma, unspecified, unspecified site: Secondary | ICD-10-CM

## 2012-12-30 DIAGNOSIS — C8330 Diffuse large B-cell lymphoma, unspecified site: Secondary | ICD-10-CM

## 2012-12-30 DIAGNOSIS — C8589 Other specified types of non-Hodgkin lymphoma, extranodal and solid organ sites: Secondary | ICD-10-CM

## 2012-12-30 DIAGNOSIS — I4891 Unspecified atrial fibrillation: Secondary | ICD-10-CM

## 2012-12-30 DIAGNOSIS — I10 Essential (primary) hypertension: Secondary | ICD-10-CM

## 2012-12-30 DIAGNOSIS — C8593 Non-Hodgkin lymphoma, unspecified, intra-abdominal lymph nodes: Secondary | ICD-10-CM

## 2012-12-30 LAB — CBC WITH DIFFERENTIAL/PLATELET
Basophils Absolute: 0.2 10*3/uL — ABNORMAL HIGH (ref 0.0–0.1)
EOS%: 1.2 % (ref 0.0–7.0)
HCT: 33.2 % — ABNORMAL LOW (ref 34.8–46.6)
HGB: 10.9 g/dL — ABNORMAL LOW (ref 11.6–15.9)
MCH: 31.2 pg (ref 25.1–34.0)
MCV: 94.8 fL (ref 79.5–101.0)
NEUT%: 71.8 % (ref 38.4–76.8)
lymph#: 1 10*3/uL (ref 0.9–3.3)

## 2012-12-30 LAB — MAGNESIUM (CC13): Magnesium: 1.6 mg/dl (ref 1.5–2.5)

## 2012-12-30 LAB — COMPREHENSIVE METABOLIC PANEL (CC13)
AST: 23 U/L (ref 5–34)
BUN: 13.4 mg/dL (ref 7.0–26.0)
Calcium: 9 mg/dL (ref 8.4–10.4)
Chloride: 105 mEq/L (ref 98–109)
Creatinine: 1.2 mg/dL — ABNORMAL HIGH (ref 0.6–1.1)
Glucose: 118 mg/dl (ref 70–140)

## 2012-12-30 NOTE — Telephone Encounter (Signed)
Confirmed with Rebecka Apley that a CD Disk of recent scans especially CT C/A?P from 12-23-12 was overnighted today to Dr. Irena Cords at the [facility] BMT.  Department Number is [phone].

## 2012-12-30 NOTE — Patient Instructions (Addendum)
Cisplatin injection What is this medicine? CISPLATIN (SIS pla tin) is a chemotherapy drug. It targets fast dividing cells, like cancer cells, and causes these cells to die. This medicine is used to treat many types of cancer like bladder, ovarian, and testicular cancers. This medicine may be used for other purposes; ask your health care provider or pharmacist if you have questions. What should I tell my health care provider before I take this medicine? They need to know if you have any of these conditions: -blood disorders -hearing problems -kidney disease -recent or ongoing radiation therapy -an unusual or allergic reaction to cisplatin, carboplatin, other chemotherapy, other medicines, foods, dyes, or preservatives -pregnant or trying to get pregnant -breast-feeding How should I use this medicine? This drug is given as an infusion into a vein. It is administered in a hospital or clinic by a specially trained health care professional. Talk to your pediatrician regarding the use of this medicine in children. Special care may be needed. Overdosage: If you think you have taken too much of this medicine contact a poison control center or emergency room at once. NOTE: This medicine is only for you. Do not share this medicine with others. What if I miss a dose? It is important not to miss a dose. Call your doctor or health care professional if you are unable to keep an appointment. What may interact with this medicine? -dofetilide -foscarnet -medicines for seizures -medicines to increase blood counts like filgrastim, pegfilgrastim, sargramostim -probenecid -pyridoxine used with altretamine -rituximab -some antibiotics like amikacin, gentamicin, neomycin, polymyxin B, streptomycin, tobramycin -sulfinpyrazone -vaccines -zalcitabine Talk to your doctor or health care professional before taking any of these medicines: -acetaminophen -aspirin -ibuprofen -ketoprofen -naproxen This list may  not describe all possible interactions. Give your health care provider a list of all the medicines, herbs, non-prescription drugs, or dietary supplements you use. Also tell them if you smoke, drink alcohol, or use illegal drugs. Some items may interact with your medicine. What should I watch for while using this medicine? Your condition will be monitored carefully while you are receiving this medicine. You will need important blood work done while you are taking this medicine. This drug may make you feel generally unwell. This is not uncommon, as chemotherapy can affect healthy cells as well as cancer cells. Report any side effects. Continue your course of treatment even though you feel ill unless your doctor tells you to stop. In some cases, you may be given additional medicines to help with side effects. Follow all directions for their use. Call your doctor or health care professional for advice if you get a fever, chills or sore throat, or other symptoms of a cold or flu. Do not treat yourself. This drug decreases your body's ability to fight infections. Try to avoid being around people who are sick. This medicine may increase your risk to bruise or bleed. Call your doctor or health care professional if you notice any unusual bleeding. Be careful brushing and flossing your teeth or using a toothpick because you may get an infection or bleed more easily. If you have any dental work done, tell your dentist you are receiving this medicine. Avoid taking products that contain aspirin, acetaminophen, ibuprofen, naproxen, or ketoprofen unless instructed by your doctor. These medicines may hide a fever. Do not become pregnant while taking this medicine. Women should inform their doctor if they wish to become pregnant or think they might be pregnant. There is a potential for serious side effects to   an unborn child. Talk to your health care professional or pharmacist for more information. Do not breast-feed an  infant while taking this medicine. Drink fluids as directed while you are taking this medicine. This will help protect your kidneys. Call your doctor or health care professional if you get diarrhea. Do not treat yourself. What side effects may I notice from receiving this medicine? Side effects that you should report to your doctor or health care professional as soon as possible: -allergic reactions like skin rash, itching or hives, swelling of the face, lips, or tongue -signs of infection - fever or chills, cough, sore throat, pain or difficulty passing urine -signs of decreased platelets or bleeding - bruising, pinpoint red spots on the skin, black, tarry stools, nosebleeds -signs of decreased red blood cells - unusually weak or tired, fainting spells, lightheadedness -breathing problems -changes in hearing -gout pain -low blood counts - This drug may decrease the number of white blood cells, red blood cells and platelets. You may be at increased risk for infections and bleeding. -nausea and vomiting -pain, swelling, redness or irritation at the injection site -pain, tingling, numbness in the hands or feet -problems with balance, movement -trouble passing urine or change in the amount of urine Side effects that usually do not require medical attention (report to your doctor or health care professional if they continue or are bothersome): -changes in vision -loss of appetite -metallic taste in the mouth or changes in taste This list may not describe all possible side effects. Call your doctor for medical advice about side effects. You may report side effects to FDA at 1-800-FDA-1088. Where should I keep my medicine? This drug is given in a hospital or clinic and will not be stored at home. NOTE: This sheet is a summary. It may not cover all possible information. If you have questions about this medicine, talk to your doctor, pharmacist, or health care provider.  2012, [employer]. (07/05/2007 2:40:54 PM)  Gemcitabine injection What is this medicine? GEMCITABINE (jem SIT a been) is a chemotherapy drug. This medicine is used to treat many types of cancer like breast cancer, lung cancer, pancreatic cancer, and ovarian cancer. This medicine may be used for other purposes; ask your health care provider or pharmacist if you have questions. What should I tell my health care provider before I take this medicine? They need to know if you have any of these conditions: -blood disorders -infection -kidney disease -liver disease -recent or ongoing radiation therapy -an unusual or allergic reaction to gemcitabine, other chemotherapy, other medicines, foods, dyes, or preservatives -pregnant or trying to get pregnant -breast-feeding How should I use this medicine? This drug is given as an infusion into a vein. It is administered in a hospital or clinic by a specially trained health care professional. Talk to your pediatrician regarding the use of this medicine in children. Special care may be needed. Overdosage: If you think you have taken too much of this medicine contact a poison control center or emergency room at once. NOTE: This medicine is only for you. Do not share this medicine with others. What if I miss a dose? It is important not to miss your dose. Call your doctor or health care professional if you are unable to keep an appointment. What may interact with this medicine? -medicines to increase blood counts like filgrastim, pegfilgrastim, sargramostim -some other chemotherapy drugs like cisplatin -vaccines Talk to your doctor or health care professional before taking any of these medicines: -  acetaminophen -aspirin -ibuprofen -ketoprofen -naproxen This list may not describe all possible interactions. Give your health care provider a list of all the medicines, herbs, non-prescription drugs, or dietary supplements you use. Also tell them if you smoke, drink  alcohol, or use illegal drugs. Some items may interact with your medicine. What should I watch for while using this medicine? Visit your doctor for checks on your progress. This drug may make you feel generally unwell. This is not uncommon, as chemotherapy can affect healthy cells as well as cancer cells. Report any side effects. Continue your course of treatment even though you feel ill unless your doctor tells you to stop. In some cases, you may be given additional medicines to help with side effects. Follow all directions for their use. Call your doctor or health care professional for advice if you get a fever, chills or sore throat, or other symptoms of a cold or flu. Do not treat yourself. This drug decreases your body's ability to fight infections. Try to avoid being around people who are sick. This medicine may increase your risk to bruise or bleed. Call your doctor or health care professional if you notice any unusual bleeding. Be careful brushing and flossing your teeth or using a toothpick because you may get an infection or bleed more easily. If you have any dental work done, tell your dentist you are receiving this medicine. Avoid taking products that contain aspirin, acetaminophen, ibuprofen, naproxen, or ketoprofen unless instructed by your doctor. These medicines may hide a fever. Women should inform their doctor if they wish to become pregnant or think they might be pregnant. There is a potential for serious side effects to an unborn child. Talk to your health care professional or pharmacist for more information. Do not breast-feed an infant while taking this medicine. What side effects may I notice from receiving this medicine? Side effects that you should report to your doctor or health care professional as soon as possible: -allergic reactions like skin rash, itching or hives, swelling of the face, lips, or tongue -low blood counts - this medicine may decrease the number of white blood  cells, red blood cells and platelets. You may be at increased risk for infections and bleeding. -signs of infection - fever or chills, cough, sore throat, pain or difficulty passing urine -signs of decreased platelets or bleeding - bruising, pinpoint red spots on the skin, black, tarry stools, blood in the urine -signs of decreased red blood cells - unusually weak or tired, fainting spells, lightheadedness -breathing problems -chest pain -mouth sores -nausea and vomiting -pain, swelling, redness at site where injected -pain, tingling, numbness in the hands or feet -stomach pain -swelling of ankles, feet, hands -unusual bleeding Side effects that usually do not require medical attention (report to your doctor or health care professional if they continue or are bothersome): -constipation -diarrhea -hair loss -loss of appetite -stomach upset This list may not describe all possible side effects. Call your doctor for medical advice about side effects. You may report side effects to FDA at 1-800-FDA-1088. Where should I keep my medicine? This drug is given in a hospital or clinic and will not be stored at home. NOTE: This sheet is a summary. It may not cover all possible information. If you have questions about this medicine, talk to your doctor, pharmacist, or health care provider.  2012, [employer]. (08/09/2007 6:45:54 PM)Rituximab injection  Fall Prevention, Elderly Falls are the leading cause of injuries, accidents, and accidental deaths in  people over the age of 8. Falling is a real threat to your ability to live on your own. CAUSES   Poor eyesight or poor hearing can make you more likely to fall.   Illnesses and physical conditions can affect your strength and balance.   Poor lighting, throw rugs and pets in your home can make you more likely to trip or slip.   The side effects of some medicines can upset your balance and lead to falling. These include medicines for  depression, sleep problems, high blood pressure, diabetes, and heart conditions.  PREVENTION  Be sure your home is as safe as possible. Here are some tips:  Wear shoes with non-skid soles (not house slippers).   Be sure your home and outside area are well lit.   Use night lights throughout your house, including hallways and stairways.   Remove clutter and clean up spills on floors and walkways.   Remove throw rugs or fasten them to the floor with carpet tape. Tack down carpet edges.   Do not place electrical cords across pathways.   Install grab bars in your bathtub, shower, and toilet area. Towel bars should not be used as a grab bar.   Install handrails on both sides of stairways.   Do not climb on stools or stepladders. Get someone else to help with jobs that require climbing.   Do not wax your floors at all, or use a non-skid wax.   Repair uneven or unsafe sidewalks, walkways or stairs.   Keep frequently used items within reach.   Be aware of pets so you do not trip.  Get regular check-ups from your doctor, and take good care of yourself:  Have your eyes checked every year for vision changes, cataracts, glaucoma, and other eye problems. Wear eyeglasses as directed.   Have your hearing checked every 2 years, or anytime you or others think that you cannot hear well. Use hearing aids as directed.   See your caregiver if you have foot pain or corns. Sore feet can contribute to falls.   Let your caregiver know if a medicine is making you feel dizzy or making you lose your balance.   Use a cane, walker, or wheelchair as directed. Use walker or wheelchair brakes when getting in and out.   When you get up from bed, sit on the side of the bed for 1 to 2 minutes before you stand up. This will give your blood pressure time to adjust, and you will feel less dizzy.   If you need to go to the bathroom often, consider using a bedside commode.  Keep your body in good shape:  Get  regular exercise, especially walking.   Do exercises to strengthen the muscles you use for walking and lifting.   Do not smoke.   Minimize use of alcohol.  SEEK IMMEDIATE MEDICAL CARE IF:   You feel dizzy, weak, or unsteady on your feet.   You feel confused.   You fall.  Document Released: 03/30/2005 Document Revised: 03/19/2011 Document Reviewed: 09/24/2006 [facility] Patient Information 2012 [place], [place]. What is this medicine? RITUXIMAB (ri TUX i mab) is a monoclonal antibody. This medicine changes the way the body's immune system works. It is used commonly to treat non-Hodgkin's lymphoma and other conditions. In cancer cells, this drug targets a specific protein within cancer cells and stops the cancer cells from growing. It is also used to treat rhuematoid arthritis (RA). In RA, this medicine slow the inflammatory process  and help reduce joint pain and swelling. This medicine is often used with other cancer or arthritis medications. This medicine may be used for other purposes; ask your health care provider or pharmacist if you have questions. What should I tell my health care provider before I take this medicine? They need to know if you have any of these conditions: -blood disorders -heart disease -history of hepatitis B -infection (especially a virus infection such as chickenpox, cold sores, or herpes) -irregular heartbeat -kidney disease -lung or breathing disease, like asthma -lupus -an unusual or allergic reaction to rituximab, mouse proteins, other medicines, foods, dyes, or preservatives -pregnant or trying to get pregnant -breast-feeding How should I use this medicine? This medicine is for infusion into a vein. It is administered in a hospital or clinic by a specially trained health care professional. A special MedGuide will be given to you by the pharmacist with each prescription and refill. Be sure to read this information carefully each time. Talk to your  pediatrician regarding the use of this medicine in children. This medicine is not approved for use in children. Overdosage: If you think you have taken too much of this medicine contact a poison control center or emergency room at once. NOTE: This medicine is only for you. Do not share this medicine with others. What if I miss a dose? It is important not to miss a dose. Call your doctor or health care professional if you are unable to keep an appointment. What may interact with this medicine? -cisplatin -medicines for blood pressure -some other medicines for arthritis -vaccines This list may not describe all possible interactions. Give your health care provider a list of all the medicines, herbs, non-prescription drugs, or dietary supplements you use. Also tell them if you smoke, drink alcohol, or use illegal drugs. Some items may interact with your medicine. What should I watch for while using this medicine? Report any side effects that you notice during your treatment right away, such as changes in your breathing, fever, chills, dizziness or lightheadedness. These effects are more common with the first dose. Visit your prescriber or health care professional for checks on your progress. You will need to have regular blood work. Report any other side effects. The side effects of this medicine can continue after you finish your treatment. Continue your course of treatment even though you feel ill unless your doctor tells you to stop. Call your doctor or health care professional for advice if you get a fever, chills or sore throat, or other symptoms of a cold or flu. Do not treat yourself. This drug decreases your body's ability to fight infections. Try to avoid being around people who are sick. This medicine may increase your risk to bruise or bleed. Call your doctor or health care professional if you notice any unusual bleeding. Be careful brushing and flossing your teeth or using a toothpick because  you may get an infection or bleed more easily. If you have any dental work done, tell your dentist you are receiving this medicine. Avoid taking products that contain aspirin, acetaminophen, ibuprofen, naproxen, or ketoprofen unless instructed by your doctor. These medicines may hide a fever. Do not become pregnant while taking this medicine. Women should inform their doctor if they wish to become pregnant or think they might be pregnant. There is a potential for serious side effects to an unborn child. Talk to your health care professional or pharmacist for more information. Do not breast-feed an infant while taking this  medicine. What side effects may I notice from receiving this medicine? Side effects that you should report to your doctor or health care professional as soon as possible: -allergic reactions like skin rash, itching or hives, swelling of the face, lips, or tongue -low blood counts - this medicine may decrease the number of white blood cells, red blood cells and platelets. You may be at increased risk for infections and bleeding. -signs of infection - fever or chills, cough, sore throat, pain or difficulty passing urine -signs of decreased platelets or bleeding - bruising, pinpoint red spots on the skin, black, tarry stools, blood in the urine -signs of decreased red blood cells - unusually weak or tired, fainting spells, lightheadedness -breathing problems -confused, not responsive -chest pain -fast, irregular heartbeat -feeling faint or lightheaded, falls -mouth sores -redness, blistering, peeling or loosening of the skin, including inside the mouth -stomach pain -swelling of the ankles, feet, or hands -trouble passing urine or change in the amount of urine Side effects that usually do not require medical attention (report to your doctor or other health care professional if they continue or are bothersome): -anxiety -headache -loss of appetite -muscle aches -nausea -night  sweats This list may not describe all possible side effects. Call your doctor for medical advice about side effects. You may report side effects to FDA at 1-800-FDA-1088. Where should I keep my medicine? This drug is given in a hospital or clinic and will not be stored at home. NOTE: This sheet is a summary. It may not cover all possible information. If you have questions about this medicine, talk to your doctor, pharmacist, or health care provider.  2012, [employer]. (11/28/2007 2:04:59 PM)

## 2012-12-30 NOTE — ED Provider Notes (Signed)
I saw and evaluated the patient, reviewed the resident's note and I agree with the findings and plan.  Patient seen and evaluated for mechanical fall. Patient required laceration repair, performed by Doctor Beverely Pace. Unremarkable, patient discharged.  Gilda Crease, MD 12/30/12 [id number]

## 2012-12-30 NOTE — Telephone Encounter (Signed)
appts made and printed...td 

## 2013-01-01 ENCOUNTER — Emergency Department (HOSPITAL_COMMUNITY): Payer: Medicare Other

## 2013-01-01 ENCOUNTER — Emergency Department (HOSPITAL_COMMUNITY)
Admission: EM | Admit: 2013-01-01 | Discharge: 2013-01-01 | Disposition: A | Discharge status: Home / Self Care | Payer: Medicare Other | Attending: Emergency Medicine | Admitting: Emergency Medicine

## 2013-01-01 ENCOUNTER — Encounter (HOSPITAL_COMMUNITY): Payer: Self-pay | Admitting: Emergency Medicine

## 2013-01-01 DIAGNOSIS — F17200 Nicotine dependence, unspecified, uncomplicated: Secondary | ICD-10-CM | POA: Insufficient documentation

## 2013-01-01 DIAGNOSIS — M1990 Unspecified osteoarthritis, unspecified site: Secondary | ICD-10-CM | POA: Insufficient documentation

## 2013-01-01 DIAGNOSIS — E785 Hyperlipidemia, unspecified: Secondary | ICD-10-CM | POA: Insufficient documentation

## 2013-01-01 DIAGNOSIS — Z950 Presence of cardiac pacemaker: Secondary | ICD-10-CM | POA: Insufficient documentation

## 2013-01-01 DIAGNOSIS — M7989 Other specified soft tissue disorders: Secondary | ICD-10-CM | POA: Insufficient documentation

## 2013-01-01 DIAGNOSIS — J069 Acute upper respiratory infection, unspecified: Secondary | ICD-10-CM | POA: Insufficient documentation

## 2013-01-01 DIAGNOSIS — E119 Type 2 diabetes mellitus without complications: Secondary | ICD-10-CM | POA: Insufficient documentation

## 2013-01-01 DIAGNOSIS — E039 Hypothyroidism, unspecified: Secondary | ICD-10-CM | POA: Insufficient documentation

## 2013-01-01 DIAGNOSIS — Z7901 Long term (current) use of anticoagulants: Secondary | ICD-10-CM | POA: Insufficient documentation

## 2013-01-01 DIAGNOSIS — Z923 Personal history of irradiation: Secondary | ICD-10-CM | POA: Insufficient documentation

## 2013-01-01 DIAGNOSIS — Z8679 Personal history of other diseases of the circulatory system: Secondary | ICD-10-CM | POA: Insufficient documentation

## 2013-01-01 DIAGNOSIS — C8583 Other specified types of non-Hodgkin lymphoma, intra-abdominal lymph nodes: Secondary | ICD-10-CM | POA: Insufficient documentation

## 2013-01-01 DIAGNOSIS — Z79899 Other long term (current) drug therapy: Secondary | ICD-10-CM | POA: Insufficient documentation

## 2013-01-01 LAB — BASIC METABOLIC PANEL
CO2: 29 mEq/L (ref 19–32)
Chloride: 99 mEq/L (ref 96–112)
Glucose, Bld: 136 mg/dL — ABNORMAL HIGH (ref 70–99)
Potassium: 4.2 mEq/L (ref 3.5–5.1)
Sodium: 138 mEq/L (ref 135–145)

## 2013-01-01 LAB — RAPID STREP SCREEN (MED CTR MEBANE ONLY): Streptococcus, Group A Screen (Direct): NEGATIVE

## 2013-01-01 LAB — CBC WITH DIFFERENTIAL/PLATELET
Basophils Relative: 1 % (ref 0–1)
Eosinophils Relative: 1 % (ref 0–5)
Hemoglobin: 10.7 g/dL — ABNORMAL LOW (ref 12.0–15.0)
Lymphocytes Relative: 8 % — ABNORMAL LOW (ref 12–46)
MCH: 31.1 pg (ref 26.0–34.0)
Monocytes Relative: 19 % — ABNORMAL HIGH (ref 3–12)
Neutrophils Relative %: 71 % (ref 43–77)
RBC: 3.44 MIL/uL — ABNORMAL LOW (ref 3.87–5.11)
WBC: 14 10*3/uL — ABNORMAL HIGH (ref 4.0–10.5)

## 2013-01-01 MED ORDER — HYDROCOD POLST-CHLORPHEN POLST 10-8 MG/5ML PO LQCR
5.0000 mL | Freq: Two times a day (BID) | ORAL | Status: DC | PRN
Start: 2013-01-01 — End: 2013-01-10

## 2013-01-01 MED ORDER — ALBUTEROL SULFATE (5 MG/ML) 0.5% IN NEBU
5.0000 mg | INHALATION_SOLUTION | Freq: Once | RESPIRATORY_TRACT | Status: AC
  Administered 2013-01-01: 5 mg via RESPIRATORY_TRACT
  Filled 2013-01-01: qty 1

## 2013-01-01 NOTE — ED Provider Notes (Signed)
CSN: [id number]     Arrival date & time 01/01/13  1641 History   First MD Initiated Contact with Patient 01/01/13 1655     Chief Complaint  Patient presents with  . Sore Throat  . Cough  . Nasal Congestion    HPI  [name] is a 72 y.o. female with a PMH of non-Hodgkin's lymphoma on chemotherapy and hx of radiation, sick sinus syndrome s/p pacemaker, DM, hx of atrial fibrillation, and OA who presents to the ED for evaluation of sore throat, cough, and nasal congestion.  History was provided by the patient.  Patient states that at 2:30 AM she woke up coughing. Her cough is productive with white sputum. No hemoptysis. She did not take anything to treat her cough. She states that she has albuterol at home however did not use it. She also has a runny nose and sore throat. She describes her sore throat as a "scratchy sensation."  Sick contacts include her son who was diagnosed with a sore throat in the emergency room and was put on antibiotics.  She states that she has not had a fever, chills, change in appetite or activity, chest pain, shortness of breath, abdominal pain, nausea, vomiting, diarrhea, constipation, dysuria, headache, dizziness or lightheadedness. She has leg edema which is at her baseline. She states she gets chemotherapy 2 times a week and her next scheduled appointment is on September 30.  Her oncologist is Dr. Myra Rude and her PCP is Dr. Lucky Cowboy.     Past Medical History  Diagnosis Date  . Sick sinus syndrome   . Hematoma     At the site of the pacemaker insertion.  . Hypotension   . Dyslipidemia   . Diabetes mellitus   . Pacemaker   . History of atrial fibrillation   . Arthritis     Osteoarthritis  . Hypothyroidism   . Port-a-cath in place 12/08/10  . Status post chemotherapy completed 03/2011    R - [facility] q 3 weeks x 6  . S/P radiation therapy 05/28/2011 - 06/26/2011    Right Abdomen and Right Pelvis/3060 cGy in 17 Fractions  . Diffuse large B cell  lymphoma   . Cancer 10/2010    large B cell lymphoma  . 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 Pacemaker insertion      Lead revision completed August 04, 2006  . Cholecystectomy    . Tubal ligation      Bilateral  . Insert / replace / remove pacemaker    . Abdominal hysterectomy    . Biopsy stomach  11/03/10    Soft Tissue Mass, Biopsy, Right Lower Quadrant Mesenteric Mass- High Grade Non-Hodgkins B Cell Lymphoma  with Flow Cytometry  . Bone biopsy  11/24/10    Bone Marrow, Aspirate Biospy, and clot, Left - No Involvement of Non-Hodgkin's Lymphoma Identified   Family History  Problem Relation Age of Onset  . Stroke Mother   . Stroke Father   . Aneurysm Son     Brain  . Cancer Sister      1 sister had vaginal cancer  . Cancer Brother     Prostate   History  Substance Use Topics  . Smoking status: Current Some Day Smoker -- 1.50 packs/day for 30 years    Types: Cigarettes  . Smokeless tobacco: Never Used  . Alcohol Use: No   OB History  Grav Para Term Preterm Abortions TAB SAB Ect Mult Living                 Review of Systems  Constitutional: Negative for fever, chills, activity change, appetite change and fatigue.  HENT: Positive for congestion, sore throat and rhinorrhea. Negative for ear pain, mouth sores, trouble swallowing, neck pain and neck stiffness.   Eyes: Negative for visual disturbance.  Respiratory: Positive for cough and wheezing. Negative for shortness of breath.   Cardiovascular: Positive for leg swelling (at baseline). Negative for chest pain.  Gastrointestinal: Negative for nausea, vomiting, abdominal pain, diarrhea and constipation.  Genitourinary: Negative for dysuria.  Musculoskeletal: Negative for back pain and gait problem.  Skin: Negative for rash and wound.  Neurological: Negative for dizziness, syncope, weakness, light-headedness, numbness and  headaches.  Psychiatric/Behavioral: Negative for confusion.    Allergies  Codeine  Home Medications   Current Outpatient Rx  Name  Route  Sig  Dispense  Refill  . albuterol (PROVENTIL) (2.5 MG/3ML) 0.083% nebulizer solution   Nebulization   Take 2.5 mg by nebulization every 6 (six) hours as needed.         Marland Kitchen allopurinol (ZYLOPRIM) 300 MG tablet   Oral   Take 300 mg by mouth daily.         . Cholecalciferol (VITAMIN D3) 2000 UNITS TABS   Oral   Take 2,000 Units by mouth 2 (two) times daily.          . citalopram (CELEXA) 20 MG tablet   Oral   Take 20 mg by mouth every morning.          . diltiazem (CARDIZEM CD) 180 MG 24 hr capsule   Oral   Take 180 mg by mouth every morning.          Marland Kitchen levothyroxine (SYNTHROID, LEVOTHROID) 100 MCG tablet   Oral   Take 100 mcg by mouth every morning.          . lidocaine-prilocaine (EMLA) cream   Topical   Apply topically as needed. Apply to port a cath site one hour before needle stick as needed.   30 g   2   . loperamide (IMODIUM) 2 MG capsule   Oral   Take 2 mg by mouth 4 (four) times daily as needed for diarrhea or loose stools.         . magnesium oxide (MAG-OX) 400 (241.3 MG) MG tablet   Oral   Take 1 tablet (400 mg total) by mouth 2 (two) times daily.   60 tablet   0   . metFORMIN (GLUCOPHAGE) 500 MG tablet   Oral   Take 1,000 mg by mouth 2 (two) times daily.          . ondansetron (ZOFRAN) 8 MG tablet   Oral   Take 1 tablet (8 mg total) by mouth every 12 (twelve) hours as needed for nausea.   20 tablet   3   . potassium chloride (K-DUR) 10 MEQ tablet      TAKE 2 TABLETS (20 MEQ TOTAL) BY MOUTH ONCE.   60 tablet   0     Dispense as written.    No refills available   . pravastatin (PRAVACHOL) 40 MG tablet   Oral   Take 40 mg by mouth at bedtime.          . prochlorperazine (COMPAZINE) 10 MG tablet   Oral   Take 1 tablet (10 mg total) by mouth  every 6 (six) hours as needed.   30  tablet   3   . promethazine (PHENERGAN) 25 MG suppository   Rectal   Place 1 suppository (25 mg total) rectally every 12 (twelve) hours as needed for nausea.   12 each   0   . warfarin (COUMADIN) 6 MG tablet   Oral   Take 6 mg by mouth daily. 6 mg on Mon, Wed, Fri, Sat and Sundays.  3 mg on Tues and Thurs          BP 119/53  Pulse 85  Temp(Src) 99.7 F (37.6 C) (Oral)  Resp 21  SpO2 95%  Filed Vitals:   01/01/13 1653 01/01/13 2006  BP: 119/53 103/64  Pulse: 85 78  Temp: 99.7 F (37.6 C) 98.5 F (36.9 C)  TempSrc: Oral Oral  Resp: 21 18  SpO2: 95% 100%    Physical Exam  Nursing note and vitals reviewed. Constitutional: She is oriented to person, place, and time. She appears well-developed and well-nourished. No distress.  HENT:  Head: Normocephalic and atraumatic.  Right Ear: External ear normal.  Left Ear: External ear normal.  Nose: Nose normal.  Mouth/Throat: Oropharynx is clear and moist. No oropharyngeal exudate.  Tympanic membranes gray and translucent bilaterally.  Uvula midline. No trismus. No erythema to the posterior pharynx.  Eyes: Conjunctivae and EOM are normal. Pupils are equal, round, and reactive to light. Right eye exhibits no discharge. Left eye exhibits no discharge.  Neck: Normal range of motion. Neck supple.  Cardiovascular: Normal rate, regular rhythm, normal heart sounds and intact distal pulses.  Exam reveals no gallop and no friction rub.   No murmur heard. Pulmonary/Chest: Effort normal. No respiratory distress. She has wheezes. She has no rales. She exhibits no tenderness.  Inspiratory and expiratory wheezing throughout.  Abdominal: Soft. Bowel sounds are normal. She exhibits no distension. There is no tenderness. There is no rebound and no guarding.   Protuberant abdomen.  Right upper quadrant is firm to palpation however is nontender. Unable to localize a definite mass.  Musculoskeletal: She exhibits no edema and no tenderness.   Neurological: She is alert and oriented to person, place, and time.  Skin: Skin is warm and dry. She is not diaphoretic.    ED Course  Procedures (including critical care time) Labs Review Labs Reviewed - No data to display Imaging Review No results found.   Results for orders placed during the hospital encounter of 01/01/13  RAPID STREP SCREEN      Result Value Range   Streptococcus, Group A Screen (Direct) NEGATIVE  NEGATIVE  CULTURE, GROUP A STREP      Result Value Range   Specimen Description THROAT     Special Requests NONE     Culture       Value: NO SUSPICIOUS COLONIES, CONTINUING TO HOLD     Performed at [facility]   Report Status PENDING    CBC WITH DIFFERENTIAL      Result Value Range   WBC 14.0 (*) 4.0 - 10.5 K/uL   RBC 3.44 (*) 3.87 - 5.11 MIL/uL   Hemoglobin 10.7 (*) 12.0 - 15.0 g/dL   HCT 45.4 (*) 09.8 - 11.9 %   MCV 96.5  78.0 - 100.0 fL   MCH 31.1  26.0 - 34.0 pg   MCHC 32.2  30.0 - 36.0 g/dL   RDW 14.7 (*) 82.9 - 56.2 %   Platelets 192  150 - 400 K/uL  Neutrophils Relative % 71  43 - 77 %   Lymphocytes Relative 8 (*) 12 - 46 %   Monocytes Relative 19 (*) 3 - 12 %   Eosinophils Relative 1  0 - 5 %   Basophils Relative 1  0 - 1 %   Neutro Abs 10.0 (*) 1.7 - 7.7 K/uL   Lymphs Abs 1.1  0.7 - 4.0 K/uL   Monocytes Absolute 2.7 (*) 0.1 - 1.0 K/uL   Eosinophils Absolute 0.1  0.0 - 0.7 K/uL   Basophils Absolute 0.1  0.0 - 0.1 K/uL   Smear Review MORPHOLOGY UNREMARKABLE    BASIC METABOLIC PANEL      Result Value Range   Sodium 138  135 - 145 mEq/L   Potassium 4.2  3.5 - 5.1 mEq/L   Chloride 99  96 - 112 mEq/L   CO2 29  19 - 32 mEq/L   Glucose, Bld 136 (*) 70 - 99 mg/dL   BUN 13  6 - 23 mg/dL   Creatinine, Ser 1.61 (*) 0.50 - 1.10 mg/dL   Calcium 9.3  8.4 - 09.6 mg/dL   GFR calc non Af Amer 43 (*) >90 mL/min   GFR calc Af Amer 50 (*) >90 mL/min   DG Chest 2 View (Final result)  Result time: 01/01/13 17:46:35    Final result by Rad  Results In Interface (01/01/13 17:46:35)    Narrative:   CLINICAL DATA: Chest pain and cough  EXAM: CHEST 2 VIEW  COMPARISON: Chest radiograph April 05, 2011 and chest CT December 23, 2012  FINDINGS: Lungs are clear. Heart is upper normal in size with normal pulmonary vascularity. No adenopathy. Port-A-Cath tip is in the superior vena cava near the cavoatrial junction. Pacemaker leads are attached to the right atrium and middle cardiac vein. No pneumothorax. .  IMPRESSION: No edema or consolidation.   Electronically Signed By: Bretta Bang On: 01/01/2013 17:46         MDM   1. URI (upper respiratory infection)      [name] is a 72 y.o. female with a PMH of non-Hodgkin's lymphoma on chemotherapy and hx of radiation, sick sinus syndrome s/p pacemaker, DM, hx of atrial fibrillation, and OA who presents to the ED for evaluation of sore throat, cough, and nasal congestion.  Chest x-ray, CBC, BMP, rapid strep ordered to further evaluate.  Albuterol ordered.       Rechecks  6:34 PM = Patient getting albuterol tx right now.  No acute distress.    7:45 PM = re-evaluated patient. Lungs clear to auscultation bilaterally.  Wheezing resolved. Patient with family and is in no acute distress.    Etiology of sore throat, cough, and nasal congestion is likely due to an upper respiratory infection. Rapid strep was negative. Chest x-ray was negative for an acute cardiopulmonary process. Patient had an elevated white blood cell count however this appears to be her baseline.  Patient received a nebulized albuterol treatment which improved her symptoms. Patient has nebulized albuterol at home which she states she can take for symptomatic relief.  Patient was found to have an anemia which also appears to be her baseline.  She also had a mildly elevated creatinine which also is her baseline.  Patient was afebrile emergency department and remained in no acute distress.  Patient had  a firm right upper quadrant palpable nontender mass which he states is her baseline and is due to her lymphoma. Patient was prescribed Tussionex for outpatient  management of her cough.  Patient was instructed to return to the ED if they experience any fever, hemoptysis, wheezing or shortness of breath, chest pain or other concerns.  She has an appointment with her oncologist on the 30th. She was instructed to followup with her primary care provider or oncologist if her symptoms are not improving or resolved in a few days. Patient was in agreement with discharge and plan.     Final impressions: 1. Upper respiratory infection    Luiz Iron PA-C   This patient was discussed with Dr. Barry Dienes, PA-C 01/03/13 [phone]

## 2013-01-01 NOTE — ED Notes (Signed)
Pt states that she has been having a sore throat, productive cough, and nasal congestion since 0230 this morning.

## 2013-01-02 ENCOUNTER — Telehealth: Payer: Self-pay | Admitting: *Deleted

## 2013-01-02 NOTE — Progress Notes (Signed)
[name] OFFICE PROGRESS NOTE  MCKEOWN,Dana Mana Haberl, MD [address] [place] [address]  DIAGNOSIS: Lymphoma, non-Hodgkin's, R mesentery - Plan: CBC with Differential, Lactate dehydrogenase, Comprehensive metabolic panel  Diffuse large B cell lymphoma - Plan: CBC with Differential, Lactate dehydrogenase, Comprehensive metabolic panel  Non-Hodgkin's lymphoma of abdomen - Plan: CBC with Differential, Lactate dehydrogenase, Comprehensive metabolic panel  Recurrent lymphoma - Plan: CBC with Differential, Lactate dehydrogenase, Comprehensive metabolic panel  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  PAST THERAPY: q3wk R-CHOP x 6 cycles; finished in Dec 2012; followed by consolidative radiation therapy. She developed recurrent disease; started on bendamustine/Rituxan on 06/29/2012. She completed 3 cycles on 08/25/12. Stopped due to disease progression.  CURRENT THERAPY: Started salvage chemotherapy with R-GPD on 10/26/12   INTERVAL HISTORY: [name] 72 y.o. female returns for follow up visit. She was last seen by Dr. Roanna [name] who referred her to [name] (Dr. Irena [name]) for consideration of Stem cell transplant. They are concerned with her co-morbidities and have not yet reviewed the images for comparison.  She had a fall on Tuesday (09/17) near her car resulting in an emergency room visit which required stiches to the top of her forehead. She stated she slip.  She is accompanied today with her family--her niece [name] ann her niece's husband, [name].  Patient received 2 units of blood on 09/04 with her gemcitabine infusion.  She neulasta on 12/16/12.  She denied headaches, double vision, blurry vision, nasal congestion, nasal discharge, hearing problems, odynophagia or dysphagia. No chest pain, palpitations, dyspnea, cough, abdominal pain, vomiting, diarrhea, constipation, hematochezia. The patient denied dysuria, nocturia, polyuria,  hematuria, myalgia, numbness, tingling, psychiatric problems.   MEDICAL HISTORY: Past Medical History  Diagnosis Date  . Sick sinus syndrome   . Hematoma     At the site of the pacemaker insertion.  . Hypotension   . Dyslipidemia   . Diabetes mellitus   . Pacemaker   . History of atrial fibrillation   . Arthritis     Osteoarthritis  . Hypothyroidism   . Port-a-cath in place 12/08/10  . Status post chemotherapy completed 03/2011    R - CHOP q 3 weeks x 6  . S/P radiation therapy 05/28/2011 - 06/26/2011    Right Abdomen and Right Pelvis/3060 cGy in 17 Fractions  . Diffuse large B cell lymphoma   . Cancer 10/2010    large B cell lymphoma  . 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 Non-Hodgkin's lymphoma of abdomen 05/20/2011    INTERIM HISTORY: has TOBACCO ABUSE; Essential hypertension, benign; Atrial fibrillation; SINUS BRADYCARDIA; Cardiac pacemaker in situ; Lymphoma, non-Hodgkin's, R mesentery; Diffuse large B cell lymphoma; Sick sinus syndrome; Hypothyroidism; Diabetes mellitus type 2 in obese; Hyperlipidemia; Gastroenteritis, acute; Non-Hodgkin's lymphoma of abdomen; Scalp laceration; Recurrent lymphoma; A-fib; Depression; and Generalized weakness on her problem list.    ALLERGIES:  is allergic to codeine.  MEDICATIONS: has a current medication list which includes the following prescription(s): allopurinol, citalopram, diltiazem, levothyroxine, lidocaine-prilocaine, loperamide, magnesium oxide, metformin, ondansetron, pravastatin, prochlorperazine, promethazine, warfarin, albuterol, chlorpheniramine-hydrocodone, vitamin d3, and potassium chloride, and the following Facility-Administered Medications: sodium chloride.  SURGICAL HISTORY:  Past Surgical History  Procedure Laterality Date  . Pacemaker insertion      Lead revision completed August 04, 2006  . Cholecystectomy    . Tubal ligation      Bilateral  . Insert / replace / remove pacemaker    . Abdominal hysterectomy    . Biopsy  stomach  11/03/10    Soft Tissue Mass, Biopsy, Right Lower Quadrant Mesenteric Mass- High Grade Non-Hodgkins B Cell Lymphoma  with Flow Cytometry  . Bone biopsy  11/24/10    Bone Marrow, Aspirate Biospy, and clot, Left - No Involvement of Non-Hodgkin's Lymphoma Identified    REVIEW OF SYSTEMS:   Constitutional: Denies fevers, chills or abnormal weight loss Eyes: Denies blurriness of vision Ears, nose, mouth, throat, and face: Denies mucositis or sore throat Respiratory: Denies cough, dyspnea or wheezes Cardiovascular: Denies palpitation, chest discomfort or lower extremity swelling Gastrointestinal:  Denies nausea, heartburn or change in bowel habits Skin: Denies abnormal skin rashes Lymphatics: Denies new lymphadenopathy or easy bruising Neurological:Denies numbness, tingling or new weaknesses Behavioral/Psych: Mood is stable, no new changes  All other systems were reviewed with the patient and are negative.  PHYSICAL EXAMINATION: ECOG PERFORMANCE STATUS: 2 - Symptomatic, <50% confined to bed  Blood pressure 125/73, pulse 118, temperature 97.9 F (36.6 C), temperature source Oral, resp. rate 20, height 4\' 8"  (1.422 m), weight 236 lb 3.2 oz (107.14 kg).  GENERAL:alert, no distress and comfortable; obese, sitting in wheelchair.  SKIN: skin color, texture, turgor are normal, no rashes or significant lesions EYES: normal, Conjunctiva are pink and non-injected, sclera clear OROPHARYNX:no exudate, no erythema and lips, buccal mucosa, and tongue normal  NECK: supple, thyroid normal size, non-tender, without nodularity LYMPH:  no palpable lymphadenopathy in the cervical LUNGS: clear to auscultation and percussion with normal breathing effort HEART: Irregular Irregular and no murmurs and no lower extremity edema ABDOMEN:obese, abdomen soft, non-tender and normal bowel sounds; patient sitting in wheelchair.  Musculoskeletal:no cyanosis of digits and no clubbing  NEURO: alert & oriented x 3  with fluent speech, no focal motor/sensory deficits  LABORATORY DATA: CBC    Component Value Date/Time   WBC 14.0* 01/01/2013 1750   WBC 13.9* 12/30/2012 1227   RBC 3.44* 01/01/2013 1750   RBC 3.51* 12/30/2012 1227   HGB 10.7* 01/01/2013 1750   HGB 10.9* 12/30/2012 1227   HCT 33.2* 01/01/2013 1750   HCT 33.2* 12/30/2012 1227   PLT 192 01/01/2013 1750   PLT 179 12/30/2012 1227   MCV 96.5 01/01/2013 1750   MCV 94.8 12/30/2012 1227   MCH 31.1 01/01/2013 1750   MCH 31.2 12/30/2012 1227   MCHC 32.2 01/01/2013 1750   MCHC 32.9 12/30/2012 1227   RDW 21.1* 01/01/2013 1750   RDW 17.0* 12/30/2012 1227   LYMPHSABS 1.1 01/01/2013 1750   LYMPHSABS 1.0 12/30/2012 1227   MONOABS 2.7* 01/01/2013 1750   MONOABS 2.6* 12/30/2012 1227   EOSABS 0.1 01/01/2013 1750   EOSABS 0.2 12/30/2012 1227   BASOSABS 0.1 01/01/2013 1750   BASOSABS 0.2* 12/30/2012 1227   CMP     Component Value Date/Time   NA 138 01/01/2013 1750   NA 141 12/30/2012 1227   NA 141 10/12/2011 0957   K 4.2 01/01/2013 1750   K 4.4 12/30/2012 1227   K 4.4 10/12/2011 0957   CL 99 01/01/2013 1750   CL 102 09/21/2012 0917   CL 95* 10/12/2011 0957   CO2 29 01/01/2013 1750   CO2 26 12/30/2012 1227   CO2 29 10/12/2011 0957   GLUCOSE 136* 01/01/2013 1750   GLUCOSE 118 12/30/2012 1227   GLUCOSE 161* 09/21/2012 0917   GLUCOSE 131* 10/12/2011 0957   BUN 13 01/01/2013 1750   BUN 13.4 12/30/2012 1227   BUN 15 10/12/2011 0957   CREATININE 1.23* 01/01/2013 1750   CREATININE 1.2* 12/30/2012 1227  CREATININE 0.9 10/12/2011 0957   CALCIUM 9.3 01/01/2013 1750   CALCIUM 9.0 12/30/2012 1227   CALCIUM 9.1 10/12/2011 0957   PROT 5.7* 12/30/2012 1227   PROT 7.2 10/12/2011 0957   PROT 6.4 07/29/2011 1314   ALBUMIN 3.2* 12/30/2012 1227   ALBUMIN 3.9 07/29/2011 1314   AST 23 12/30/2012 1227   AST 19 10/12/2011 0957   AST 16 07/29/2011 1314   ALT 11 12/30/2012 1227   ALT 20 10/12/2011 0957   ALT 14 07/29/2011 1314   ALKPHOS 111 12/30/2012 1227   ALKPHOS 65 10/12/2011 0957   ALKPHOS 63 07/29/2011 1314    BILITOT 0.32 12/30/2012 1227   BILITOT 0.60 10/12/2011 0957   BILITOT 0.3 07/29/2011 1314   GFRNONAA 43* 01/01/2013 1750   GFRAA 50* 01/01/2013 1750      RADIOGRAPHIC STUDIES: Dg Chest 2 View  01/01/2013   CLINICAL DATA:  Chest pain and cough  EXAM: CHEST  2 VIEW  COMPARISON:  Chest radiograph April 05, 2011 and chest CT December 23, 2012  FINDINGS: Lungs are clear. Heart is upper normal in size with normal pulmonary vascularity. No adenopathy. Port-A-Cath tip is in the superior vena cava near the cavoatrial junction. Pacemaker leads are attached to the right atrium and middle cardiac vein. No pneumothorax. .  IMPRESSION: No edema or consolidation.   Electronically Signed   By: Bretta Bang   On: 01/01/2013 17:46   Dg Knee 2 Views Left  12/27/2012   *RADIOLOGY REPORT*  Clinical Data: Fall, anterior left knee pain  LEFT KNEE - 1-2 VIEW  Comparison: Prior radiographs of the left knee 05/18/2004  Findings: Advanced tricompartmental degenerative osteoarthritis with bone on bone contact in the medial compartment.  No acute fracture or malalignment identified.  The bones are diffusely osteopenic.  No suprapatellar joint effusion.  Overall, the degree of degenerative change has not significantly progressed compared to 2006.  IMPRESSION:  1.  No acute fracture or malalignment. 2.  Advanced tricompartmental osteoarthritis without significant interval progression compared to February 2006.   Original Report Authenticated By: Malachy Moan, M.D.   Ct Head Wo Contrast  12/27/2012   CLINICAL DATA:  Fall  EXAM: CT HEAD WITHOUT CONTRAST  TECHNIQUE: Contiguous axial images were obtained from the base of the skull through the vertex without intravenous contrast.  COMPARISON:  08/20/2011  FINDINGS: No skull fracture is noted. Paranasal sinuses and mastoid air cells relative bike level. No intracranial hemorrhage, mass effect or midline shift. There is mild scalp swelling midline frontal region. See axial  image 9.  Mild cerebral atrophy. Mild periventricular white matter decreased attenuation probable due to chronic small vessel ischemic changes. No acute cortical infarction. No mass lesion is noted on this unenhanced scan.  IMPRESSION: No acute intracranial abnormality. Mild scalp swelling frontal region supraorbital. Mild cerebral atrophy.   Electronically Signed   By: Natasha Mead   On: 12/27/2012 20:39   Ct Chest W Contrast  12/23/2012   CLINICAL DATA:  Followup large B-cell lymphoma. Undergoing chemotherapy. Previous radiation therapy. Shortness of breath.  EXAM: CT CHEST, ABDOMEN, AND PELVIS WITH CONTRAST  TECHNIQUE: Multidetector CT imaging of the chest, abdomen and pelvis was performed following the standard protocol during bolus administration of intravenous contrast.  CONTRAST:  OMNIPAQUE IOHEXOL 300 MG/ML  SOLN  COMPARISON:  01/18/2012  FINDINGS: CT CHEST FINDINGS  Small less than 1 cm mediastinal lymph nodes are stable and not pathologically enlarged. No pathologically enlarged lymph nodes or other masses are  seen within the thorax. Mild cardiomegaly is stable as well as tiny hiatal hernia.  No evidence of pleural or pericardial effusion. Both lungs are clear. No suspicious pulmonary nodules or masses are identified.  CT ABDOMEN AND PELVIS FINDINGS  The liver, spleen, pancreas, adrenal glands, and kidneys are normal in appearance. No evidence of hydronephrosis.  Mild retroperitoneal lymphadenopathy is seen in the aortocaval space and encasing the aortic an IVC bifurcations, which has increased since previous exam. This now measures 2.0 x 2.5 cm in maximum dimensions on image 71 compared to 1.2 x 1.7 cm previously. Right lower quadrant mesenteric lymph node measures 1.2 x 2.5 cm on image 75, and is not significant changed since prior study.  Prior hysterectomy noted. Adnexal regions are unremarkable. Diverticulosis is again seen involving the descending and sigmoid colon, however there is no  evidence of diverticulitis. No other inflammatory process or abnormal fluid collections are identified.  IMPRESSION: CT CHEST IMPRESSION  Stable chest CT. No evidence of lymphadenopathy or other acute findings.  CT ABDOMEN AND PELVIS IMPRESSION  Further increase in mild retroperitoneal lymphadenopathy. Stable small right lower quadrant mesenteric lymph node.   Electronically Signed   By: Myles Rosenthal   On: 12/23/2012 13:47   Ct Abdomen Pelvis W Contrast  12/23/2012   CLINICAL DATA:  Followup large B-cell lymphoma. Undergoing chemotherapy. Previous radiation therapy. Shortness of breath.  EXAM: CT CHEST, ABDOMEN, AND PELVIS WITH CONTRAST  TECHNIQUE: Multidetector CT imaging of the chest, abdomen and pelvis was performed following the standard protocol during bolus administration of intravenous contrast.  CONTRAST:  OMNIPAQUE IOHEXOL 300 MG/ML SOLN  COMPARISON:  01/18/2012  FINDINGS: CT CHEST FINDINGS  Small less than 1 cm mediastinal lymph nodes are stable and not pathologically enlarged. No pathologically enlarged lymph nodes or other masses are seen within the thorax. Mild cardiomegaly is stable as well as tiny hiatal hernia.  No evidence of pleural or pericardial effusion. Both lungs are clear. No suspicious pulmonary nodules or masses are identified.  CT ABDOMEN AND PELVIS FINDINGS  The liver, spleen, pancreas, adrenal glands, and kidneys are normal in appearance. No evidence of hydronephrosis.  Mild retroperitoneal lymphadenopathy is seen in the aortocaval space and encasing the aortic an IVC bifurcations, which has increased since previous exam. This now measures 2.0 x 2.5 cm in maximum dimensions on image 71 compared to 1.2 x 1.7 cm previously. Right lower quadrant mesenteric lymph node measures 1.2 x 2.5 cm on image 75, and is not significant changed since prior study.  Prior hysterectomy noted. Adnexal regions are unremarkable. Diverticulosis is again seen involving the descending and sigmoid colon,  however there is no evidence of diverticulitis. No other inflammatory process or abnormal fluid collections are identified.  IMPRESSION: CT CHEST IMPRESSION  Stable chest CT. No evidence of lymphadenopathy or other acute findings.  CT ABDOMEN AND PELVIS IMPRESSION  Further increase in mild retroperitoneal lymphadenopathy. Stable small right lower quadrant mesenteric lymph node.   Electronically Signed   By: Myles Rosenthal   On: 12/23/2012 15:45    ASSESSMENT:  72 year female with co-morbidities as outlined above with relapsed high grade non-Hodgkin's B cell lymphoma on salvage therapy with R+DGP Harrold Donath M et. Al, Cancer 2004).   PLAN:  - She completed 2 cycles of R+DGP and I reviewed the CT Scans of C/A/P consistent with further increase in mild retroperitoneal lymphadenopathy.  We will all ow Vaidya to review these images and determine next treatment decision. We may continue continuation  of R+DGP versus R-ICE (Zelenetz AD et. Larey Brick Oncol 2003) or  Vs R-CEPP [name] [place] et al, Blood, 1990).   We must weigh disease control against cumulative overlapping toxicities.   - Follow up: in 2 weeks after CT scan and Dr. Loistine Chance consult.  - Confirmed with [name] that a CD Disk of recent scans especially CT C/A?P from 12-23-12 was overnighted on 09.19/14 to Dr. Irena [name] at [name]. Department Number is [phone]. 2. Hypothyroidism: Levothyroxine per PCP. 3. Hypomagnesemia. Magnesium sulfate 4 gm IV. Check magnesium on 08/30 and 12/13/12 4. Hypertension: Diltiazem per PCP.  5. Diabetes mellitus, type II: Metformin per PCP.  6. Hyperlipidemia: Pravastatin per PCP.  7. History of atrial fibrillation: Diltiazem, pacemaker. Coumadin being managed per her PCP.  All questions were answered. The patient knows to call the clinic with any problems, questions or concerns. We can certainly see the patient much sooner if necessary.  I spent 20 minutes counseling the patient face to face. The  total time spent in the appointment was 40 minutes.    Izayiah Tibbitts, MD 12/30/2012  7:45 PM

## 2013-01-02 NOTE — Telephone Encounter (Signed)
THIS MORNING PT.'S TEMPERATURE IS 99.2. HER BREATH SOUNDS ARE COARSE AND SHE HAS WHEEZES. PT. HAS A DEEP COUGH. O2 SAT AT REST IS 98% BUT AFTER AMBULATING O2 SAT WAS 93%. ALBUTEROL TREATMENT GIVEN WHICH HELPED A LITTLE. B/P 150/80 HEART RATE AT REST IS 92 AFTER AMBULATION HEART RATE IS 120. PT. DOES HAVE A-FIB. IN THE EMERGENCY ROOM A CHEST XRAY WAS DONE WHICH WAS NEGATIVE. NO ANTIBIOTICS WERE GIVEN. PT. ALSO WAS SEEN IN THE EMERGENCY ROOM ON 12/27/12 AFTER A FALL. PT.'S NEXT MD APPOINTMENT IS 01/10/13. SHE HAS LAB TOMORROW 01/03/13. [facility] THINKS PT. MAY NEED TO BE SEEN BEFORE 01/10/13. THIS NOTE GIVEN TO DR.CHISM'S NURSE, ROBIN BASS,RN.

## 2013-01-03 ENCOUNTER — Other Ambulatory Visit: Payer: Medicare Other | Admitting: Lab

## 2013-01-03 LAB — CULTURE, GROUP A STREP

## 2013-01-04 NOTE — ED Provider Notes (Signed)
Medical screening examination/treatment/procedure(s) were conducted as a shared visit with non-physician practitioner(s) and myself.  I personally evaluated the patient during the encounter  Pt with h/o lymphoma has URI symptoms but no fever, neg CXR and no neutropenia. Plan symptomatic care and close Onc followup.   Terrian Sentell B. Bernette Mayers, MD 01/04/13 [phone]

## 2013-01-06 ENCOUNTER — Other Ambulatory Visit: Payer: Medicare Other | Admitting: Lab

## 2013-01-10 ENCOUNTER — Ambulatory Visit (HOSPITAL_BASED_OUTPATIENT_CLINIC_OR_DEPARTMENT_OTHER): Payer: Medicare Other | Admitting: Internal Medicine

## 2013-01-10 ENCOUNTER — Telehealth: Payer: Self-pay | Admitting: Internal Medicine

## 2013-01-10 ENCOUNTER — Other Ambulatory Visit: Payer: Self-pay | Admitting: Medical Oncology

## 2013-01-10 ENCOUNTER — Other Ambulatory Visit (HOSPITAL_BASED_OUTPATIENT_CLINIC_OR_DEPARTMENT_OTHER): Payer: Medicare Other | Admitting: Lab

## 2013-01-10 ENCOUNTER — Ambulatory Visit (HOSPITAL_COMMUNITY)
Admission: RE | Admit: 2013-01-10 | Discharge: 2013-01-10 | Disposition: A | Discharge status: Home / Self Care | Payer: Medicare Other | Source: Ambulatory Visit | Attending: Internal Medicine | Admitting: Internal Medicine

## 2013-01-10 VITALS — BP 121/54 | HR 79 | Temp 98.3°F | Resp 20 | Ht <= 58 in | Wt 223.7 lb

## 2013-01-10 DIAGNOSIS — S0101XA Laceration without foreign body of scalp, initial encounter: Secondary | ICD-10-CM

## 2013-01-10 DIAGNOSIS — C8583 Other specified types of non-Hodgkin lymphoma, intra-abdominal lymph nodes: Secondary | ICD-10-CM

## 2013-01-10 DIAGNOSIS — C8590 Non-Hodgkin lymphoma, unspecified, unspecified site: Secondary | ICD-10-CM

## 2013-01-10 DIAGNOSIS — R0600 Dyspnea, unspecified: Secondary | ICD-10-CM

## 2013-01-10 DIAGNOSIS — E039 Hypothyroidism, unspecified: Secondary | ICD-10-CM

## 2013-01-10 DIAGNOSIS — C8330 Diffuse large B-cell lymphoma, unspecified site: Secondary | ICD-10-CM

## 2013-01-10 DIAGNOSIS — E119 Type 2 diabetes mellitus without complications: Secondary | ICD-10-CM

## 2013-01-10 DIAGNOSIS — R059 Cough, unspecified: Secondary | ICD-10-CM | POA: Insufficient documentation

## 2013-01-10 DIAGNOSIS — C8593 Non-Hodgkin lymphoma, unspecified, intra-abdominal lymph nodes: Secondary | ICD-10-CM

## 2013-01-10 DIAGNOSIS — R05 Cough: Secondary | ICD-10-CM | POA: Insufficient documentation

## 2013-01-10 DIAGNOSIS — J069 Acute upper respiratory infection, unspecified: Secondary | ICD-10-CM

## 2013-01-10 DIAGNOSIS — I10 Essential (primary) hypertension: Secondary | ICD-10-CM

## 2013-01-10 DIAGNOSIS — R0609 Other forms of dyspnea: Secondary | ICD-10-CM | POA: Insufficient documentation

## 2013-01-10 DIAGNOSIS — I4891 Unspecified atrial fibrillation: Secondary | ICD-10-CM

## 2013-01-10 DIAGNOSIS — R0989 Other specified symptoms and signs involving the circulatory and respiratory systems: Secondary | ICD-10-CM | POA: Insufficient documentation

## 2013-01-10 LAB — COMPREHENSIVE METABOLIC PANEL (CC13)
ALT: 9 U/L (ref 0–55)
AST: 17 U/L (ref 5–34)
CO2: 26 mEq/L (ref 22–29)
Chloride: 105 mEq/L (ref 98–109)
Creatinine: 1.1 mg/dL (ref 0.6–1.1)
Sodium: 141 mEq/L (ref 136–145)
Total Bilirubin: 0.46 mg/dL (ref 0.20–1.20)
Total Protein: 6.5 g/dL (ref 6.4–8.3)

## 2013-01-10 LAB — CBC WITH DIFFERENTIAL/PLATELET
BASO%: 0.3 % (ref 0.0–2.0)
EOS%: 1.7 % (ref 0.0–7.0)
LYMPH%: 14.8 % (ref 14.0–49.7)
MCH: 32.2 pg (ref 25.1–34.0)
MCHC: 33.7 g/dL (ref 31.5–36.0)
MONO#: 1.1 10*3/uL — ABNORMAL HIGH (ref 0.1–0.9)
NEUT%: 67.2 % (ref 38.4–76.8)
RBC: 3.18 10*6/uL — ABNORMAL LOW (ref 3.70–5.45)
WBC: 6.9 10*3/uL (ref 3.9–10.3)
lymph#: 1 10*3/uL (ref 0.9–3.3)

## 2013-01-10 LAB — LACTATE DEHYDROGENASE (CC13): LDH: 262 U/L — ABNORMAL HIGH (ref 125–245)

## 2013-01-10 MED ORDER — HYDROCOD POLST-CHLORPHEN POLST 10-8 MG/5ML PO LQCR: 5.0000 mL | Freq: Two times a day (BID) | ORAL | Status: DC

## 2013-01-10 MED ORDER — AZITHROMYCIN 250 MG PO TABS: ORAL_TABLET | ORAL | Status: DC

## 2013-01-10 NOTE — Telephone Encounter (Signed)
gv adn printed appt sched and avs for tp for OCT

## 2013-01-10 NOTE — Patient Instructions (Signed)
Azithromycin oral suspension (extended release) What is this medicine? AZITHROMYCIN (az ith roe MYE sin) is a macrolide antibiotic. It is used to treat or prevent certain kinds of bacterial infections. It will not work for colds, flu, or other viral infections. This medicine may be used for other purposes; ask your health care provider or pharmacist if you have questions. What should I tell my health care provider before I take this medicine? They need to know if you have any of these conditions: -kidney disease -liver disease -irregular heartbeat or heart disease -an unusual or allergic reaction to azithromycin, erythromycin, other macrolide antibiotics, foods, dyes, or preservatives -pregnant or trying to get pregnant -breast-feeding How should I use this medicine? Take this medicine by mouth. Follow the directions on the prescription label. Take this medicine on an empty stomach, 1 hour before or 2 hours after meals. Drink your full dose at once. Do not split the dose. If you vomit within 1 hour of taking the dose, let your health care provider know immediately, you may need more medicine. Talk to your pediatrician regarding the use of this medicine in children. Special care may be needed. Overdosage: If you think you have taken too much of this medicine contact a poison control center or emergency room at once. NOTE: This medicine is only for you. Do not share this medicine with others. What if I miss a dose? This does not apply because you will take all of the medicine as 1 dose. What may interact with this medicine? Do not take this medicine with any of the following medications: -lincomycin This medicine may also interact with the following medications: -amiodarone -antacids -cyclosporine -digoxin -dihydroergotamine or ergotamine -magnesium -nelfinavir -phenytoin -warfarin This list may not describe all possible interactions. Give your health care provider a list of all the  medicines, herbs, non-prescription drugs, or dietary supplements you use. Also tell them if you smoke, drink alcohol, or use illegal drugs. Some items may interact with your medicine. What should I watch for while using this medicine? Tell your doctor or health care professional if your symptoms do not improve. Do not treat diarrhea with over the counter products. Contact your doctor if you have diarrhea that lasts more than 2 days or if it is severe and watery. This medicine can make you more sensitive to the sun. Keep out of the sun. If you cannot avoid being in the sun, wear protective clothing and use sunscreen. Do not use sun lamps or tanning beds/booths. What side effects may I notice from receiving this medicine? Side effects that you should report to your doctor or health care professional as soon as possible: -allergic reactions like skin rash, itching or hives, swelling of the face, lips, or tongue -confusion, nightmares or hallucinations -dark urine -difficulty breathing -hearing loss -irregular heartbeat or chest pain -pain or difficulty passing urine -redness, blistering, peeling or loosening of the skin, including inside the mouth -white patches or sores in the mouth -yellowing of the eyes or skin Side effects that usually do not require medical attention (report to your doctor or health care professional if they continue or are bothersome): -diarrhea -dizziness, drowsiness -headache -stomach upset or vomiting -tooth discoloration -vaginal irritation This list may not describe all possible side effects. Call your doctor for medical advice about side effects. You may report side effects to FDA at 1-800-FDA-1088. Where should I keep my medicine? Keep out of reach of children. Store at room temperature between 15 and 30 degrees C (  59 and 86 degrees F). Keep the bottle closed tightly until ready to use. Use within 12 hours. NOTE: This sheet is a summary. It may not cover all  possible information. If you have questions about this medicine, talk to your doctor, pharmacist, or health care provider.  2012, [employer]. (06/23/2007 11:12:29 AM)Pneumococcal Pneumonia Pneumonia is an infection of the lungs, and is typically caused by tiny (microscopic) living organisms such as bacteria and viruses. Streptococcus pneumoniae (also referred to as the pneumococcus, Streptococcal pneumonia, and Pneumococcal pneumonia) is the most common bacteria that causes pneumonia in adults and children. This infection is not contagious (it is not spread from person to person). The organism is typically found in the nose and throat of healthy adults and children, but does not cause infection at these sites. Pneumonia occurs when the pneumococcus is breathed into the deeper part of the lungs (respiratory tract).  In some people, particularly the very young or elderly and those who are debilitated, pneumococcal pneumonia may follow influenza or even a common cold. Some people may also get pneumonia while staying in a hospital for other conditions. SYMPTOMS  The most common symptoms are cough, fever, chest pain, an increased rate of breathing, wheezing, and sputum production.  DIAGNOSIS   Pneumonia is diagnosed by your caregiver using a combination of:   Analyzing your symptoms.   Listening to your lungs with a stethoscope.   Chest x-ray.   Diagnosing pneumococcal pneumonia requires growing the bacteria from a culture of your sputum or blood.  TREATMENT  Pneumococcal pneumonia generally responds well to antibiotic medications. Patients can be treated in hospital or at home, depending on their overall condition.  Vaccination A pneumococcal shot (vaccine) is available to prevent this common cause of bacterial pneumonia and its potential complications. Among adults, it is suggested or recommended for people 53 years old and older and for groups of individuals at high risk of acquiring  this infection. These include individuals:  With lung or breathing problems (such as smokers, or people with asthma, emphysema and chronic bronchitis).   On chemotherapy.   With problems of their immune system.  You may be offered or have been given a pneumococcal vaccination as part of your treatment or hospitalization. If you are being treated in a hospital:  Your health care provider may prescribe medicine you can take by mouth or by intravenous (IV) means. This means your medicine is given into a vein.   You may be given oxygen and IV fluids.   Your heart rate and breathing may be monitored.   X-rays are frequently taken to keep an eye on your pneumonia.   You will have pneumococcal screening or vaccination if you are over 70 years old and are not immunized.   Your caregiver may do tests (such as blood gasses or pulse oximetry) to see how well your lungs are working.   If you are a smoker, it is time to quit. You will receive instructions on how to best stop smoking. Your caregivers can provide medicine and counseling to help you quit.   Usually you will begin to feel better after 2 to 3 days of antibiotics. If you are an otherwise healthy person, you should feel close to normal after a week or so. If you are over 37 years old or have other medical problems, it may take longer to feel normal.  HOME CARE INSTRUCTIONS   Get plenty of rest but also get up as much as possible. Follow  your caregivers' instructions for returning to activities.   Drink more liquids (water, tea, or fruit juice) every day to help you cough up mucus more easily.   Cough suppressants may be used if you are losing too much rest. However, a cough protects [place] by clearing our lungs. This is one reason for not overusing cough suppressants.   Your cough may be worse at night. Sleeping in a semi-upright position in a recliner, or using a couple pillows under your head will help with this.   Take all your  prescribed medication until finished.   Your caregiver may also prescribe an expectorant to loosen the mucus. Cough up secretions as much as possible.   Only take over-the-counter or prescription medicines for pain, discomfort, or fever as directed by your caregiver.   Smoking is a common cause of bronchitis and can contribute to pneumonia. Stopping this habit is an important self help step.   A cold steam vaporizer or humidifier in your room or home may help loosen mucus.   Rest as you need it. Your body will usually let you know when to rest.   If you are a smoker and continue to smoke, your cough may last several weeks after your pneumonia has cleared.   Use a heating pad on a low setting to reduce chest discomfort. Do not sleep with a heating pad.   Call your caregiver if you feel you are getting worse or if you are not getting better in 2 to 3 days.  PREVENTION   Stop smoking. Smoking decreases your ability to get well.   Wear a mask when cleaning dusty or moldy areas and around people who may be infectious. Avoid visiting people when you know they are infectious even though this pneumonia is usually not catchy.   Ask your caregiver what vaccines you should have. This is especially important if you:   Are in a care setting such as a nursing home.   Work around a lot of other people.   Have breathing problems.   Are immunocompromised with cancer chemotherapy or have HIV.   Pneumococcal vaccine (Pneumovax, Prevnar) prevents pneumococcal infections and complications of this infection.   Flu vaccine prevents pneumonia and other infections caused by influenza viruses. It must be taken yearly to protect against new viral strains. Take it every fall when it is available at your caregivers practice site.   Hib vaccine prevents pneumonia in children from Haemophilus influenzae type b. Having children vaccinated helps keep them from bringing home infections to their older loved ones.    SEEK IMMEDIATE MEDICAL CARE IF:   You develop more pus like (purulent) sputum, have an uncontrolled temperature, or your illness becomes worse. This is especially important if you are elderly or weakened from any other disease.   You can not control your cough with suppressants and are losing sleep.   You begin coughing up blood.   You develop pain that is getting worse or is uncontrolled with medications, or you have chest pain that worsens with coughing or breathing.   You develop a new oral temperature above 102 F (38.9 C), after not having a temperature for one or more days.   Any of the symptoms which initially brought you in for treatment are getting worse rather than better, or you develop shortness of breath or chest pain.  Document Released: 07/10/2005 Document Revised: 12/10/2010 Document Reviewed: 10/10/2008 [facility] Patient Information 2012 [place], [place].Bronchitis Bronchitis is the body's way of reacting to  injury and/or infection (inflammation) of the bronchi. Bronchi are the air tubes that extend from the windpipe into the lungs. If the inflammation becomes severe, it may cause shortness of breath. CAUSES  Inflammation may be caused by:  A virus.  Germs (bacteria).  Dust.  Allergens.  Pollutants and many other irritants. The cells lining the bronchial tree are covered with tiny hairs (cilia). These constantly beat upward, away from the lungs, toward the mouth. This keeps the lungs free of pollutants. When these cells become too irritated and are unable to do their job, mucus begins to develop. This causes the characteristic cough of bronchitis. The cough clears the lungs when the cilia are unable to do their job. Without either of these protective mechanisms, the mucus would settle in the lungs. Then you would develop pneumonia. Smoking is a common cause of bronchitis and can contribute to pneumonia. Stopping this habit is the single most important thing you can do  to help yourself. TREATMENT   Your caregiver may prescribe an antibiotic if the cough is caused by bacteria. Also, medicines that open up your airways make it easier to breathe. Your caregiver may also recommend or prescribe an expectorant. It will loosen the mucus to be coughed up. Only take over-the-counter or prescription medicines for pain, discomfort, or fever as directed by your caregiver.  Removing whatever causes the problem (smoking, for example) is critical to preventing the problem from getting worse.  Cough suppressants may be prescribed for relief of cough symptoms.  Inhaled medicines may be prescribed to help with symptoms now and to help prevent problems from returning.  For those with recurrent (chronic) bronchitis, there may be a need for steroid medicines. SEEK IMMEDIATE MEDICAL CARE IF:   During treatment, you develop more pus-like mucus (purulent sputum).  You have a fever.  Your baby is older than 3 months with a rectal temperature of 102 F (38.9 C) or higher.  Your baby is 48 months old or younger with a rectal temperature of 100.4 F (38 C) or higher.  You become progressively more ill.  You have increased difficulty breathing, wheezing, or shortness of breath. It is necessary to seek immediate medical care if you are elderly or sick from any other disease. MAKE SURE YOU:   Understand these instructions.  Will watch your condition.  Will get help right away if you are not doing well or get worse. Document Released: 03/30/2005 Document Revised: 06/22/2011 Document Reviewed: 02/07/2008 [facility] Patient Information 2014 [place], [place].

## 2013-01-11 NOTE — Progress Notes (Signed)
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  PAST THERAPY: q3wk R-CHOP x 6 cycles; finished in Dec 2012; followed by consolidative radiation therapy. She developed recurrent disease; started on bendamustine/Rituxan on 06/29/2012. She completed 3 cycles on 08/25/12. Stopped due to disease progression.  CURRENT THERAPY: Started salvage chemotherapy with R-GPD on 10/26/12.  Being held as she recover from her fall and respiratory illness.    INTERVAL HISTORY: Tamatha Gadbois Petti 72 y.o. female returns for follow up visit. She was last seen by me on 12/30/2012.  She been referred  to [facility] (Dr. Irena Cords) for consideration of Stem cell transplant.  She told me today that Dr. Alphonsa Gin wanted to pursue further workup.   She had a fall on Tuesday (09/17) near her car resulting in an emergency room visit which required stiches to the top of her forehead. She stated she slip.  Today, She is accompanied today with her family--her niece Madelaine Etienne and her niece [name].  She had an emergency visit on 09/21 for an upper respiratory infections.  She was discharged with albuterol treatments.  She has a greater than 40 year pack history.  She reports initial exposure from her grandson who was sick.  She has had continued coughing with increase and change in her sputum production.  She can recall if she had a fever.  She denied headaches, double vision, blurry vision, nasal congestion, nasal discharge, hearing problems, odynophagia or dysphagia. No chest pain, palpitations, dyspnea, cough, abdominal pain, vomiting, diarrhea, constipation, hematochezia. The patient  denied dysuria, nocturia, polyuria, hematuria, myalgia, numbness, tingling, psychiatric problems.  MEDICAL HISTORY: Past Medical History  Diagnosis Date  . Sick sinus syndrome   . Hematoma     At the site of the pacemaker insertion.  . Hypotension   . Dyslipidemia   . Diabetes mellitus   . Pacemaker   . History of atrial fibrillation   . Arthritis     Osteoarthritis  . Hypothyroidism   . Port-a-cath in place 12/08/10  . Status post chemotherapy completed 03/2011    R - CHOP q 3 weeks x 6  . S/P radiation therapy 05/28/2011 - 06/26/2011    Right Abdomen and Right Pelvis/3060 cGy in 17 Fractions  . Diffuse large B cell lymphoma   . Cancer 10/2010    large B cell lymphoma  . 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 Non-Hodgkin's lymphoma of abdomen 05/20/2011    INTERIM HISTORY: has TOBACCO ABUSE; Essential hypertension, benign; Atrial fibrillation; SINUS BRADYCARDIA; Cardiac pacemaker in situ; Lymphoma, non-Hodgkin's, R mesentery; Diffuse large B cell lymphoma; Sick sinus syndrome; Hypothyroidism; Diabetes mellitus type 2 in obese; Hyperlipidemia; Gastroenteritis, acute; Non-Hodgkin's lymphoma of abdomen; Scalp laceration; Recurrent lymphoma; A-fib; Depression; and Generalized weakness on her problem list.    ALLERGIES:  is allergic to codeine.  MEDICATIONS: has a current medication list which includes the following prescription(s): albuterol, allopurinol, vitamin d3, citalopram, diltiazem, levothyroxine, lidocaine-prilocaine, magnesium oxide, ondansetron, potassium chloride, pravastatin, prochlorperazine, promethazine, warfarin, azithromycin, chlorpheniramine-hydrocodone, loperamide, and metformin, and the following Facility-Administered Medications: sodium chloride.  SURGICAL HISTORY:  Past Surgical History  Procedure Laterality Date  . Pacemaker insertion      Lead revision completed August 04, 2006  . Cholecystectomy    .  Tubal ligation      Bilateral  . Insert / replace / remove  pacemaker    . Abdominal hysterectomy    . Biopsy stomach  11/03/10    Soft Tissue Mass, Biopsy, Right Lower Quadrant Mesenteric Mass- High Grade Non-Hodgkins B Cell Lymphoma  with Flow Cytometry  . Bone biopsy  11/24/10    Bone Marrow, Aspirate Biospy, and clot, Left - No Involvement of Non-Hodgkin's Lymphoma Identified    REVIEW OF SYSTEMS:   Constitutional: Denies fevers, chills or abnormal weight loss Eyes: Denies blurriness of vision Ears, nose, mouth, throat, and face: Denies mucositis or sore throat Respiratory: Denies cough, dyspnea or wheezes Cardiovascular: Denies palpitation, chest discomfort or lower extremity swelling Gastrointestinal:  Denies nausea, heartburn or change in bowel habits Skin: Denies abnormal skin rashes Lymphatics: Denies new lymphadenopathy or easy bruising Neurological:Denies numbness, tingling or new weaknesses Behavioral/Psych: Mood is stable, no new changes  All other systems were reviewed with the patient and are negative.  PHYSICAL EXAMINATION: ECOG PERFORMANCE STATUS: 2 - Symptomatic, <50% confined to bed  BP 121/54  Pulse 79  Temp(Src) 98.3 F (36.8 C) (Oral)  Resp 20  Ht 4\' 8"  (1.422 m)  Wt 223 lb 11.2 oz (101.47 kg)  BMI 50.18 kg/m2  GENERAL:alert, no distress and comfortable; obese, sitting in wheelchair.  SKIN: skin color, texture, turgor are normal, no rashes or significant lesions; Frontal laceration bounded by sutures healing well.  EYES: normal, Conjunctiva are pink and non-injected, sclera clear OROPHARYNX:no exudate, no erythema and lips, buccal mucosa, and tongue normal  NECK: supple, thyroid normal size, non-tender, without nodularity LYMPH:  no palpable lymphadenopathy in the cervical LUNGS: clear to auscultation and percussion with normal breathing effort HEART: Irregular Irregular and no murmurs and no lower extremity edema ABDOMEN:obese, abdomen soft, non-tender and normal bowel sounds; patient sitting in wheelchair.   Musculoskeletal:no cyanosis of digits and no clubbing  NEURO: alert & oriented x 3 with fluent speech, no focal motor/sensory deficits  LABORATORY DATA: CBC    Component Value Date/Time   WBC 6.9 01/10/2013 0828   WBC 14.0* 01/01/2013 1750   RBC 3.18* 01/10/2013 0828   RBC 3.44* 01/01/2013 1750   HGB 10.2* 01/10/2013 0828   HGB 10.7* 01/01/2013 1750   HCT 30.4* 01/10/2013 0828   HCT 33.2* 01/01/2013 1750   PLT 230 01/10/2013 0828   PLT 192 01/01/2013 1750   MCV 95.5 01/10/2013 0828   MCV 96.5 01/01/2013 1750   MCH 32.2 01/10/2013 0828   MCH 31.1 01/01/2013 1750   MCHC 33.7 01/10/2013 0828   MCHC 32.2 01/01/2013 1750   RDW 24.3* 01/10/2013 0828   RDW 21.1* 01/01/2013 1750   LYMPHSABS 1.0 01/10/2013 0828   LYMPHSABS 1.1 01/01/2013 1750   MONOABS 1.1* 01/10/2013 0828   MONOABS 2.7* 01/01/2013 1750   EOSABS 0.1 01/10/2013 0828   EOSABS 0.1 01/01/2013 1750   BASOSABS 0.0 01/10/2013 0828   BASOSABS 0.1 01/01/2013 1750    CMP     Component Value Date/Time   NA 141 01/10/2013 0828   NA 138 01/01/2013 1750   NA 141 10/12/2011 0957   K 4.5 01/10/2013 0828   K 4.2 01/01/2013 1750   K 4.4 10/12/2011 0957   CL 99 01/01/2013 1750   CL 102 09/21/2012 0917   CL 95* 10/12/2011 0957   CO2 26 01/10/2013 0828   CO2 29 01/01/2013 1750   CO2 29 10/12/2011 0957   GLUCOSE 99 01/10/2013 0828   GLUCOSE 136*  01/01/2013 1750   GLUCOSE 161* 09/21/2012 0917   GLUCOSE 131* 10/12/2011 0957   BUN 9.9 01/10/2013 0828   BUN 13 01/01/2013 1750   BUN 15 10/12/2011 0957   CREATININE 1.1 01/10/2013 0828   CREATININE 1.23* 01/01/2013 1750   CREATININE 0.9 10/12/2011 0957   CALCIUM 9.7 01/10/2013 0828   CALCIUM 9.3 01/01/2013 1750   CALCIUM 9.1 10/12/2011 0957   PROT 6.5 01/10/2013 0828   PROT 7.2 10/12/2011 0957   PROT 6.4 07/29/2011 1314   ALBUMIN 3.3* 01/10/2013 0828   ALBUMIN 3.9 07/29/2011 1314   AST 17 01/10/2013 0828   AST 19 10/12/2011 0957   AST 16 07/29/2011 1314   ALT 9 01/10/2013 0828   ALT 20 10/12/2011 0957   ALT 14 07/29/2011 1314    ALKPHOS 56 01/10/2013 0828   ALKPHOS 65 10/12/2011 0957   ALKPHOS 63 07/29/2011 1314   BILITOT 0.46 01/10/2013 0828   BILITOT 0.60 10/12/2011 0957   BILITOT 0.3 07/29/2011 1314   GFRNONAA 43* 01/01/2013 1750   GFRAA 50* 01/01/2013 1750   RADIOGRAPHIC STUDIES: Dg Chest 2 View  01/01/2013   CLINICAL DATA:  Chest pain and cough  EXAM: CHEST  2 VIEW  COMPARISON:  Chest radiograph April 05, 2011 and chest CT December 23, 2012  FINDINGS: Lungs are clear. Heart is upper normal in size with normal pulmonary vascularity. No adenopathy. Port-A-Cath tip is in the superior vena cava near the cavoatrial junction. Pacemaker leads are attached to the right atrium and middle cardiac vein. No pneumothorax. .  IMPRESSION: No edema or consolidation.   Electronically Signed   By: Bretta Bang   On: 01/01/2013 17:46   Dg Knee 2 Views Left  12/27/2012   *RADIOLOGY REPORT*  Clinical Data: Fall, anterior left knee pain  LEFT KNEE - 1-2 VIEW  Comparison: Prior radiographs of the left knee 05/18/2004  Findings: Advanced tricompartmental degenerative osteoarthritis with bone on bone contact in the medial compartment.  No acute fracture or malalignment identified.  The bones are diffusely osteopenic.  No suprapatellar joint effusion.  Overall, the degree of degenerative change has not significantly progressed compared to 2006.  IMPRESSION:  1.  No acute fracture or malalignment. 2.  Advanced tricompartmental osteoarthritis without significant interval progression compared to February 2006.   Original Report Authenticated By: Malachy Moan, M.D.   Ct Head Wo Contrast  12/27/2012   CLINICAL DATA:  Fall  EXAM: CT HEAD WITHOUT CONTRAST  TECHNIQUE: Contiguous axial images were obtained from the base of the skull through the vertex without intravenous contrast.  COMPARISON:  08/20/2011  FINDINGS: No skull fracture is noted. Paranasal sinuses and mastoid air cells relative bike level. No intracranial hemorrhage, mass effect or  midline shift. There is mild scalp swelling midline frontal region. See axial image 9.  Mild cerebral atrophy. Mild periventricular white matter decreased attenuation probable due to chronic small vessel ischemic changes. No acute cortical infarction. No mass lesion is noted on this unenhanced scan.  IMPRESSION: No acute intracranial abnormality. Mild scalp swelling frontal region supraorbital. Mild cerebral atrophy.   Electronically Signed   By: Natasha Mead   On: 12/27/2012 20:39   Ct Chest W Contrast  12/23/2012   CLINICAL DATA:  Followup large B-cell lymphoma. Undergoing chemotherapy. Previous radiation therapy. Shortness of breath.  EXAM: CT CHEST, ABDOMEN, AND PELVIS WITH CONTRAST  TECHNIQUE: Multidetector CT imaging of the chest, abdomen and pelvis was performed following the standard protocol during bolus administration of intravenous contrast.  CONTRAST:  OMNIPAQUE IOHEXOL 300 MG/ML  SOLN  COMPARISON:  01/18/2012  FINDINGS: CT CHEST FINDINGS  Small less than 1 cm mediastinal lymph nodes are stable and not pathologically enlarged. No pathologically enlarged lymph nodes or other masses are seen within the thorax. Mild cardiomegaly is stable as well as tiny hiatal hernia.  No evidence of pleural or pericardial effusion. Both lungs are clear. No suspicious pulmonary nodules or masses are identified.  CT ABDOMEN AND PELVIS FINDINGS  The liver, spleen, pancreas, adrenal glands, and kidneys are normal in appearance. No evidence of hydronephrosis.  Mild retroperitoneal lymphadenopathy is seen in the aortocaval space and encasing the aortic an IVC bifurcations, which has increased since previous exam. This now measures 2.0 x 2.5 cm in maximum dimensions on image 71 compared to 1.2 x 1.7 cm previously. Right lower quadrant mesenteric lymph node measures 1.2 x 2.5 cm on image 75, and is not significant changed since prior study.  Prior hysterectomy noted. Adnexal regions are unremarkable. Diverticulosis is  again seen involving the descending and sigmoid colon, however there is no evidence of diverticulitis. No other inflammatory process or abnormal fluid collections are identified.  IMPRESSION: CT CHEST IMPRESSION  Stable chest CT. No evidence of lymphadenopathy or other acute findings.  CT ABDOMEN AND PELVIS IMPRESSION  Further increase in mild retroperitoneal lymphadenopathy. Stable small right lower quadrant mesenteric lymph node.   Electronically Signed   By: Myles Rosenthal   On: 12/23/2012 13:47   Ct Abdomen Pelvis W Contrast  12/23/2012   CLINICAL DATA:  Followup large B-cell lymphoma. Undergoing chemotherapy. Previous radiation therapy. Shortness of breath.  EXAM: CT CHEST, ABDOMEN, AND PELVIS WITH CONTRAST  TECHNIQUE: Multidetector CT imaging of the chest, abdomen and pelvis was performed following the standard protocol during bolus administration of intravenous contrast.  CONTRAST:  OMNIPAQUE IOHEXOL 300 MG/ML SOLN  COMPARISON:  01/18/2012  FINDINGS: CT CHEST FINDINGS  Small less than 1 cm mediastinal lymph nodes are stable and not pathologically enlarged. No pathologically enlarged lymph nodes or other masses are seen within the thorax. Mild cardiomegaly is stable as well as tiny hiatal hernia.  No evidence of pleural or pericardial effusion. Both lungs are clear. No suspicious pulmonary nodules or masses are identified.  CT ABDOMEN AND PELVIS FINDINGS  The liver, spleen, pancreas, adrenal glands, and kidneys are normal in appearance. No evidence of hydronephrosis.  Mild retroperitoneal lymphadenopathy is seen in the aortocaval space and encasing the aortic an IVC bifurcations, which has increased since previous exam. This now measures 2.0 x 2.5 cm in maximum dimensions on image 71 compared to 1.2 x 1.7 cm previously. Right lower quadrant mesenteric lymph node measures 1.2 x 2.5 cm on image 75, and is not significant changed since prior study.  Prior hysterectomy noted. Adnexal regions are  unremarkable. Diverticulosis is again seen involving the descending and sigmoid colon, however there is no evidence of diverticulitis. No other inflammatory process or abnormal fluid collections are identified.  IMPRESSION: CT CHEST IMPRESSION  Stable chest CT. No evidence of lymphadenopathy or other acute findings.  CT ABDOMEN AND PELVIS IMPRESSION  Further increase in mild retroperitoneal lymphadenopathy. Stable small right lower quadrant mesenteric lymph node.   Electronically Signed   By: Myles Rosenthal   On: 12/23/2012 15:45    ASSESSMENT:  72 year female with co-morbidities as outlined above with relapsed high grade non-Hodgkin's B cell lymphoma on salvage therapy with R+DGP Harrold Donath M et. Al, Cancer 2004), now recovering from a  fall (09/17) and a respiratory illness (09/21).   PLAN:  - She completed 2 cycles of R+DGP and I reviewed the CT Scans of C/A/P last visit consistent with further increase in mild retroperitoneal lymphadenopathy.  We will continue to allow Dr. Alphonsa Gin to review these images and determine next treatment decision. Currently, she is still recovering from her upper respiratory infection and her prior fall.  We will consider re-starting her R+DGP on her next visit if she is fully recovered.    -Given the persistence of her symptoms including increased and changes in sputum production in the setting of her long-standing smoking history, I will provide a z-pack to treat it as acute on chronic COPD exacerbation.  She was informed of the risk of increasing her INR due to the interaction with azithromycin. We will monitor closely for bruising or any signs of bleeding.  She will obtain a baseline INR today.   In addition, we will obtain a CXR.  - Follow up: in 3 weeks after full recovery from her fall and URI.   2. Hypothyroidism: Levothyroxine per PCP. 3. Hypertension: Diltiazem per PCP.  4. Diabetes mellitus, type II: Metformin per PCP.  5. Hyperlipidemia: Pravastatin per PCP.   6. History of atrial fibrillation: Diltiazem, pacemaker. Coumadin being managed per her PCP.  All questions were answered. The patient knows to call the clinic with any problems, questions or concerns. We can certainly see the patient much sooner if necessary.  I spent 20 minutes counseling the patient face to face. The total time spent in the appointment was 40 minutes.    Anihya Tuma, MD 01/11/2013 5:45 AM

## 2013-01-16 ENCOUNTER — Encounter: Payer: Self-pay | Admitting: Internal Medicine

## 2013-01-16 DIAGNOSIS — I495 Sick sinus syndrome: Secondary | ICD-10-CM

## 2013-01-23 NOTE — Telephone Encounter (Signed)
In error,

## 2013-01-28 ENCOUNTER — Other Ambulatory Visit: Payer: Self-pay | Admitting: Internal Medicine

## 2013-01-28 ENCOUNTER — Other Ambulatory Visit: Payer: Self-pay | Admitting: Hematology and Oncology

## 2013-01-28 DIAGNOSIS — C569 Malignant neoplasm of unspecified ovary: Secondary | ICD-10-CM

## 2013-01-30 ENCOUNTER — Other Ambulatory Visit: Payer: Self-pay | Admitting: Medical Oncology

## 2013-01-30 MED ORDER — POTASSIUM CHLORIDE ER 10 MEQ PO TBCR: EXTENDED_RELEASE_TABLET | ORAL | Status: DC

## 2013-01-30 MED ORDER — MAGNESIUM OXIDE 400 (241.3 MG) MG PO TABS: 400.0000 mg | ORAL_TABLET | Freq: Two times a day (BID) | ORAL | Status: DC

## 2013-01-31 ENCOUNTER — Encounter: Payer: Self-pay | Admitting: Medical Oncology

## 2013-01-31 ENCOUNTER — Other Ambulatory Visit (HOSPITAL_BASED_OUTPATIENT_CLINIC_OR_DEPARTMENT_OTHER): Payer: Medicare Other | Admitting: Lab

## 2013-01-31 ENCOUNTER — Telehealth: Payer: Self-pay | Admitting: Internal Medicine

## 2013-01-31 ENCOUNTER — Other Ambulatory Visit: Payer: Self-pay | Admitting: Internal Medicine

## 2013-01-31 ENCOUNTER — Telehealth: Payer: Self-pay | Admitting: Medical Oncology

## 2013-01-31 ENCOUNTER — Other Ambulatory Visit: Payer: Self-pay | Admitting: Medical Oncology

## 2013-01-31 ENCOUNTER — Ambulatory Visit (HOSPITAL_BASED_OUTPATIENT_CLINIC_OR_DEPARTMENT_OTHER): Payer: Medicare Other | Admitting: Internal Medicine

## 2013-01-31 VITALS — BP 121/59 | HR 73 | Temp 97.6°F | Resp 20 | Ht <= 58 in | Wt 219.5 lb

## 2013-01-31 DIAGNOSIS — C8590 Non-Hodgkin lymphoma, unspecified, unspecified site: Secondary | ICD-10-CM

## 2013-01-31 DIAGNOSIS — I4891 Unspecified atrial fibrillation: Secondary | ICD-10-CM

## 2013-01-31 DIAGNOSIS — R944 Abnormal results of kidney function studies: Secondary | ICD-10-CM

## 2013-01-31 DIAGNOSIS — D649 Anemia, unspecified: Secondary | ICD-10-CM

## 2013-01-31 DIAGNOSIS — C8589 Other specified types of non-Hodgkin lymphoma, extranodal and solid organ sites: Secondary | ICD-10-CM

## 2013-01-31 DIAGNOSIS — C8583 Other specified types of non-Hodgkin lymphoma, intra-abdominal lymph nodes: Secondary | ICD-10-CM

## 2013-01-31 DIAGNOSIS — E119 Type 2 diabetes mellitus without complications: Secondary | ICD-10-CM

## 2013-01-31 LAB — CBC WITH DIFFERENTIAL/PLATELET
BASO%: 1.3 % (ref 0.0–2.0)
Basophils Absolute: 0.1 10*3/uL (ref 0.0–0.1)
EOS%: 3.8 % (ref 0.0–7.0)
HCT: 32.9 % — ABNORMAL LOW (ref 34.8–46.6)
HGB: 11.1 g/dL — ABNORMAL LOW (ref 11.6–15.9)
LYMPH%: 19.2 % (ref 14.0–49.7)
MCH: 32.6 pg (ref 25.1–34.0)
MCHC: 33.8 g/dL (ref 31.5–36.0)
MCV: 96.4 fL (ref 79.5–101.0)
NEUT%: 65.8 % (ref 38.4–76.8)
Platelets: 227 10*3/uL (ref 145–400)

## 2013-01-31 LAB — COMPREHENSIVE METABOLIC PANEL (CC13)
ALT: 9 U/L (ref 0–55)
AST: 16 U/L (ref 5–34)
Albumin: 3.6 g/dL (ref 3.5–5.0)
Alkaline Phosphatase: 61 U/L (ref 40–150)
BUN: 19 mg/dL (ref 7.0–26.0)
CO2: 25 mEq/L (ref 22–29)
Calcium: 9.8 mg/dL (ref 8.4–10.4)
Creatinine: 1.5 mg/dL — ABNORMAL HIGH (ref 0.6–1.1)
Glucose: 129 mg/dl (ref 70–140)
Potassium: 4.4 mEq/L (ref 3.5–5.1)
Sodium: 142 mEq/L (ref 136–145)

## 2013-01-31 LAB — PROTIME-INR: Protime: 37.2 Seconds — ABNORMAL HIGH (ref 10.6–13.4)

## 2013-01-31 MED ORDER — DEXAMETHASONE 4 MG PO TABS: 40.0000 mg | ORAL_TABLET | Freq: Every day | ORAL | Status: DC

## 2013-01-31 NOTE — Telephone Encounter (Signed)
I called file room at [facility] to request office notes on pt. She has seen Dr. Alphonsa Gin two times and we need office progress notes.

## 2013-01-31 NOTE — Telephone Encounter (Signed)
Gave pt apt for lab and Md for November , called Michelle left messsage for chemo tomorrow, will call pt

## 2013-01-31 NOTE — Progress Notes (Signed)
[facility] OFFICE PROGRESS NOTE  Nadean Corwin, MD [address] [place] [address]  DIAGNOSIS: Lymphoma, non-Hodgkin's, R mesentery - Plan: CBC with Differential, Comprehensive metabolic panel, Lactate dehydrogenase, DISCONTINUED: dexamethasone (DECADRON) 4 MG tablet  Chief Complaint  Patient presents with  . 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   PAST THERAPY: q3wk R-CHOP x 6 cycles; finished in Dec 2012; followed by consolidative radiation therapy. She developed recurrent disease; started on bendamustine/Rituxan on 06/29/2012. She completed 3 cycles on 08/25/12. Stopped due to disease progression.   CURRENT THERAPY: Started salvage chemotherapy with R-GPD on 10/26/12 on q 3week cycles. Her last dose was on 12/15/2012 following 3 cycles. She received neupogen on day# 9. Her chemotherapy was held pending bone marrow transplant evaluation and recovery from her fall and respiratory illness.   INTERVAL HISTORY:  [name] 72 y.o. female returns for follow up visit. She was last seen by me on 01/10/2013. She been referred to [facility] (Dr. Irena Cords) for consideration of Stem cell transplant. She told me today that Dr. Alphonsa Gin wanted to pursue further workup but her recent fall and respiratory illnesses caused significant delays.  (Please see prior note for details). She s and states that she is still thinking about transplant and will call Dr. Winifred Olive office for a follow-up appointment.   Today, She is accompanied today with her family--her niece Madelaine Etienne and her niece [name]. She lives at home with frequent visits from family members.  In addition, she has a driver to bring her back and forth to clinic.  She continues to have a mild non-productive cough without fever or chills.  Her prior chest x-ray was negative.  She denied headaches, double vision, blurry vision, nasal congestion, nasal  discharge, hearing problems, odynophagia or dysphagia. No chest pain, palpitations, dyspnea, cough, abdominal pain, vomiting, diarrhea, constipation, hematochezia. The patient denied dysuria, nocturia, polyuria, hematuria, myalgia, numbness, tingling, psychiatric problems.  MEDICAL HISTORY: Past Medical History  Diagnosis Date  . Sick sinus syndrome   . Hematoma     At the site of the pacemaker insertion.  . Hypotension   . Dyslipidemia   . Diabetes mellitus   . Pacemaker   . History of atrial fibrillation   . Arthritis     Osteoarthritis  . Hypothyroidism   . Port-a-cath in place 12/08/10  . Status post chemotherapy completed 03/2011    R - CHOP q 3 weeks x 6  . S/P radiation therapy 05/28/2011 - 06/26/2011    Right Abdomen and Right Pelvis/3060 cGy in 17 Fractions  . Diffuse large B cell lymphoma   . Cancer 10/2010    large B cell lymphoma  . 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 Non-Hodgkin's lymphoma of abdomen 05/20/2011    INTERIM HISTORY: has TOBACCO ABUSE; Essential hypertension, benign; Atrial fibrillation; SINUS BRADYCARDIA; Cardiac pacemaker in situ; Lymphoma, non-Hodgkin's, R mesentery; Diffuse large B cell lymphoma; Sick sinus syndrome; Hypothyroidism; Diabetes mellitus type 2 in obese; Hyperlipidemia; Gastroenteritis, acute; Non-Hodgkin's lymphoma of abdomen; Scalp laceration; Recurrent lymphoma; A-fib; Depression; and Generalized weakness on her problem list.    ALLERGIES:  is allergic to codeine.  MEDICATIONS: has a current medication list which includes the following prescription(s): albuterol, allopurinol, vitamin d3, citalopram, diltiazem, levothyroxine, lidocaine-prilocaine, loperamide, magnesium oxide, metformin, ondansetron, potassium chloride, pravastatin, prochlorperazine, promethazine, warfarin, and dexamethasone, and the following Facility-Administered Medications: sodium chloride.  SURGICAL HISTORY:  Past Surgical History  Procedure Laterality Date  . Pacemaker  insertion      Lead revision completed August 04, 2006  . Cholecystectomy    . Tubal ligation      Bilateral  . Insert / replace / remove pacemaker    . Abdominal hysterectomy    . Biopsy stomach  11/03/10    Soft Tissue Mass, Biopsy, Right Lower Quadrant Mesenteric Mass- High Grade Non-Hodgkins B Cell Lymphoma  with Flow Cytometry  . Bone biopsy  11/24/10    Bone Marrow, Aspirate Biospy, and clot, Left - No Involvement of Non-Hodgkin's Lymphoma Identified    REVIEW OF SYSTEMS:   Constitutional: Denies fevers, chills or abnormal weight loss Eyes: Denies blurriness of vision Ears, nose, mouth, throat, and face: Denies mucositis or sore throat Respiratory: Denies cough, dyspnea or wheezes Cardiovascular: Denies palpitation, chest discomfort or lower extremity swelling Gastrointestinal:  Denies nausea, heartburn or change in bowel habits Skin: Denies abnormal skin rashes Lymphatics: Denies new lymphadenopathy or easy bruising Neurological:Denies numbness, tingling or new weaknesses Behavioral/Psych: Mood is stable, no new changes  All other systems were reviewed with the patient and are negative.  PHYSICAL EXAMINATION: ECOG PERFORMANCE STATUS: 2 - Symptomatic, <50% confined to bed  Blood pressure 121/59, pulse 73, temperature 97.6 F (36.4 C), temperature source Oral, resp. rate 20, height 4\' 8"  (1.422 m), weight 219 lb 8 oz (99.565 kg).  GENERAL:alert, no distress and comfortable;obese, sitting in wheelchair SKIN: skin color, texture, turgor are normal, no rashes or significant lesions; Frontal laceration well healed.  EYES: normal, Conjunctiva are pink and non-injected, sclera clear OROPHARYNX:no exudate, no erythema and lips, buccal mucosa, and tongue normal  NECK: supple, thyroid normal size, non-tender, without nodularity LYMPH:  no palpable lymphadenopathy in the cervical, axillary or supraclavicular LUNGS: minimal wheezes with normal breathing effort HEART: regular rate &  rhythm and no murmurs and no lower extremity edema ABDOMEN: obese, abdomen soft, non-tender and normal bowel sounds Musculoskeletal:no cyanosis of digits and no clubbing  NEURO: alert & oriented x 3 with fluent speech, no focal motor/sensory deficits   LABORATORY DATA: Results for orders placed in visit on 01/31/13 (from the past 48 hour(s))  PROTIME-INR     Status: Abnormal   Collection Time    01/31/13  8:29 AM      Result Value Range   Protime 37.2 (*) 10.6 - 13.4 Seconds   INR 3.10  2.00 - 3.50   Comment: INR is useful only to assess adequacy of anticoagulation with coumadin when comparing results from different labs. It should not be used to estimate bleeding risk or presence/abscense of coagulopathy in patients not on coumadin. Expected INR ranges for      nontherapeutic patients is 0.88 - 1.12.   Lovenox No    CBC WITH DIFFERENTIAL     Status: Abnormal   Collection Time    01/31/13  8:29 AM      Result Value Range   WBC 7.7  3.9 - 10.3 10e3/uL   NEUT# 5.1  1.5 - 6.5 10e3/uL   HGB 11.1 (*) 11.6 - 15.9 g/dL   HCT 96.0 (*) 45.4 - 09.8 %   Platelets 227  145 - 400 10e3/uL   MCV 96.4  79.5 - 101.0 fL   MCH 32.6  25.1 - 34.0 pg   MCHC 33.8  31.5 - 36.0 g/dL   RBC 1.19 (*) 1.47 - 8.29 10e6/uL   RDW 22.0 (*) 11.2 - 14.5 %   lymph# 1.5  0.9 - 3.3 10e3/uL   MONO# 0.8  0.1 - 0.9 10e3/uL   Eosinophils Absolute 0.3  0.0 - 0.5 10e3/uL   Basophils Absolute 0.1  0.0 - 0.1 10e3/uL   NEUT% 65.8  38.4 - 76.8 %   LYMPH% 19.2  14.0 - 49.7 %   MONO% 9.9  0.0 - 14.0 %   EOS% 3.8  0.0 - 7.0 %   BASO% 1.3  0.0 - 2.0 %  LACTATE DEHYDROGENASE (CC13)     Status: Abnormal   Collection Time    01/31/13  8:29 AM      Result Value Range   LDH 259 (*) 125 - 245 U/L  COMPREHENSIVE METABOLIC PANEL (CC13)     Status: Abnormal   Collection Time    01/31/13  8:31 AM      Result Value Range   Sodium 142  136 - 145 mEq/L   Potassium 4.4  3.5 - 5.1 mEq/L   Chloride 105  98 - 109 mEq/L   CO2 25  22  - 29 mEq/L   Glucose 129  70 - 140 mg/dl   BUN 16.1  7.0 - 09.6 mg/dL   Creatinine 1.5 (*) 0.6 - 1.1 mg/dL   Total Bilirubin 0.45  0.20 - 1.20 mg/dL   Alkaline Phosphatase 61  40 - 150 U/L   AST 16  5 - 34 U/L   ALT 9  0 - 55 U/L   Total Protein 6.7  6.4 - 8.3 g/dL   Albumin 3.6  3.5 - 5.0 g/dL   Calcium 9.8  8.4 - 40.9 mg/dL   Anion Gap 12 (*) 3 - 11 mEq/L    Labs:  Lab Results  Component Value Date   WBC 7.7 01/31/2013   HGB 11.1* 01/31/2013   HCT 32.9* 01/31/2013   MCV 96.4 01/31/2013   PLT 227 01/31/2013   NEUTROABS 5.1 01/31/2013      Chemistry      Component Value Date/Time   NA 142 01/31/2013 0831   NA 138 01/01/2013 1750   NA 141 10/12/2011 0957   K 4.4 01/31/2013 0831   K 4.2 01/01/2013 1750   K 4.4 10/12/2011 0957   CL 99 01/01/2013 1750   CL 102 09/21/2012 0917   CL 95* 10/12/2011 0957   CO2 25 01/31/2013 0831   CO2 29 01/01/2013 1750   CO2 29 10/12/2011 0957   BUN 19.0 01/31/2013 0831   BUN 13 01/01/2013 1750   BUN 15 10/12/2011 0957   CREATININE 1.5* 01/31/2013 0831   CREATININE 1.23* 01/01/2013 1750   CREATININE 0.9 10/12/2011 0957      Component Value Date/Time   CALCIUM 9.8 01/31/2013 0831   CALCIUM 9.3 01/01/2013 1750   CALCIUM 9.1 10/12/2011 0957   ALKPHOS 61 01/31/2013 0831   ALKPHOS 65 10/12/2011 0957   ALKPHOS 63 07/29/2011 1314   AST 16 01/31/2013 0831   AST 19 10/12/2011 0957   AST 16 07/29/2011 1314   ALT 9 01/31/2013 0831   ALT 20 10/12/2011 0957   ALT 14 07/29/2011 1314   BILITOT 0.39 01/31/2013 0831   BILITOT 0.60 10/12/2011 0957   BILITOT 0.3 07/29/2011 1314     Basic Metabolic Panel:  Recent Labs Lab 01/31/13 0831  NA 142  K 4.4  CO2 25  GLUCOSE 129  BUN 19.0  CREATININE 1.5*  CALCIUM 9.8   GFR Estimated Creatinine Clearance: 33 ml/min (by C-G formula based on Cr of 1.5). Liver Function Tests:  Recent Labs Lab 01/31/13 0831  AST 16  ALT 9  ALKPHOS 61  BILITOT 0.39  PROT 6.7  ALBUMIN 3.6   Coagulation profile  Recent Labs Lab  01/31/13 0829  INR 3.10  PROTIME 37.2*    CBC:  Recent Labs Lab 01/31/13 0829  WBC 7.7  NEUTROABS 5.1  HGB 11.1*  HCT 32.9*  MCV 96.4  PLT 227    RADIOGRAPHIC STUDIES: Dg Chest 2 View  01/10/2013   CLINICAL DATA:  Cough. Dyspnea.  EXAM: CHEST  2 VIEW  COMPARISON:  01/01/2013  FINDINGS: Power port and a dual lead pacer are in place. Heart size and vascularity are normal and the lungs are clear. No effusions. No significant osseous abnormality.  IMPRESSION: No active cardiopulmonary disease.   Electronically Signed   By: Geanie Cooley   On: 01/10/2013 10:23   Dg Chest 2 View  01/01/2013   CLINICAL DATA:  Chest pain and cough  EXAM: CHEST  2 VIEW  COMPARISON:  Chest radiograph April 05, 2011 and chest CT December 23, 2012  FINDINGS: Lungs are clear. Heart is upper normal in size with normal pulmonary vascularity. No adenopathy. Port-A-Cath tip is in the superior vena cava near the cavoatrial junction. Pacemaker leads are attached to the right atrium and middle cardiac vein. No pneumothorax. .  IMPRESSION: No edema or consolidation.   Electronically Signed   By: Bretta Bang   On: 01/01/2013 17:46    ASSESSMENT: Berneice Heinrich Losier 72 y.o. female with a history of Lymphoma, non-Hodgkin's, R mesentery - Plan: CBC with Differential, Comprehensive metabolic panel, Lactate dehydrogenase, DISCONTINUED: dexamethasone (DECADRON) 4 MG tablet  PLAN:  1. Relapsed high grade non-hodgkin's B cell Lymphoma on salvage therapy   -Chemotherapy reference: Relapsed high grade non-Hodgkin's B cell lymphoma on salvage therapy with DGP Harrold Donath M et. Al, Cancer 2004) + Rituximab  - She completed 3 cycles of R+DGP q 21 days (modified doses based on dehydration, electrolyte abnormalities) and last visit we reviewed the CT Scans of C/A/P which was consistent with a mild  increase in retroperitoneal lymphadenopathy. Since she is fully recovered from her prior fall and recent respiratory illness, we will  continue her R+DGP for cycles # 4 as demonstrated below.     Oncology Flowsheet 02/01/2013 (Day #1)  Day, Cycle Day 1, Cycle 4  CISplatin (PLATINOL) IV 37.5 mg/m2  (reduced by 50% due to creatinine clearance)  dexamethasone (DECADRON) IV 12 mg  diphenhydrAMINE (BENADRYL) PO 50 mg  fosaprepitant (EMEND) IV 150 mg  Gemcitabine HCl (GEMZAR) IV 750 mg/m2 (reduced by 25%)  palonosetron (ALOXI) IV 0.25 mg  RiTUXimab (RITUXAN) IV 375 mg/m2    Oncology Flowsheet 02/08/2013  (Day #8)  Day, Cycle Day 8, Cycle 4  diphenhydrAMINE (BENADRYL) PO 25 mg  Gemcitabine HCl (GEMZAR) IV 750 mg/m2 (reduced by 25%)  prochlorperazine (COMPAZINE) PO 10 mg   Oncology Flowsheet 02/09/2013  (Day #9)  Day, Cycle Day 9, Cycle 4  Pegfilgrastim (NEULASTA)  6 mg   -Patient was also instructed to take dexamethasone 40 mg daily on days 2-4 (and prescription was e-prescribed and patient notified).  She will also receive neulasta based on her prior high risk of infection.    --We will repeat scans to assess response in 2-3 cycles. If remission is achieved, we will continue to weigh autologous bone marrow transplant as noted above with Dr. Alphonsa Gin.  --Again, patient was counseled on the chemotherapy plan including its indications to control her disease and the risks including but not limited to myelotoxicity which can  result in life-threatening infections, nephrotoxicity, ototoxicity and neurotoxicity, nausea/vomiting, electrolyte abnormalities and fatigue.  She understood the benefits and risks and consented to proceed with therapy.    2. Supra therapeutic INR.  -- Her INR is mildly elevated today at 3.1.  Her goal is between 2 and 3.   She denies any bleeding.  We will continue to monitor closely.  Coumadin being managed per her PCP.  3. Elevated creatinine.  --Creatinine is up to 1.5 slightly increased from 1.1 (Range 1.1 to 1.6 over the past one year). We encouraged aggressive hydration and avoidance of  nephrotoxins.   We will continue to closely monitor her labs weekly.   4. Anemia likely secondary to chronic illnesses and chemotherapy. -- Her hemoglobin is 11.1 today.  She is asymptomatic presently.     5. Hypothyroidism: Levothyroxine per PCP  6.  Hypertension: Diltiazem per PCP.   7. Diabetes mellitus, type II: Metformin per PCP.   8. Hyperlipidemia: Pravastatin per PCP.   9. History of atrial fibrillation: Diltiazem, pacemaker. Coumadin being managed per her PCP.  10. Follow-up in 3 weeks for consideration of cycle #4 of R-DGP.  We will check her labs including CBC, CMP in one week and on her next visit.    All questions were answered. The patient knows to call the clinic with any problems, questions or concerns. We can certainly see the patient much sooner if necessary.  I spent 25 minutes counseling the patient face to face. The total time spent in the appointment was 40 minutes.    Sofiya Ezelle, MD 01/31/2013 3:56 PM

## 2013-02-01 ENCOUNTER — Telehealth: Payer: Self-pay | Admitting: Internal Medicine

## 2013-02-01 ENCOUNTER — Telehealth: Payer: Self-pay | Admitting: *Deleted

## 2013-02-01 NOTE — Telephone Encounter (Signed)
s.w. pt and advised on additional appts...pt going to pick up all appts at nxt visit

## 2013-02-01 NOTE — Telephone Encounter (Signed)
Per staff message I have scheduled appt. I have advised scheduler to move lab/injection by one day

## 2013-02-02 ENCOUNTER — Ambulatory Visit (HOSPITAL_BASED_OUTPATIENT_CLINIC_OR_DEPARTMENT_OTHER): Payer: Medicare Other

## 2013-02-02 VITALS — BP 111/55 | HR 72 | Temp 98.1°F | Resp 16

## 2013-02-02 DIAGNOSIS — C8590 Non-Hodgkin lymphoma, unspecified, unspecified site: Secondary | ICD-10-CM

## 2013-02-02 DIAGNOSIS — C8589 Other specified types of non-Hodgkin lymphoma, extranodal and solid organ sites: Secondary | ICD-10-CM

## 2013-02-02 DIAGNOSIS — Z5111 Encounter for antineoplastic chemotherapy: Secondary | ICD-10-CM

## 2013-02-02 DIAGNOSIS — C8330 Diffuse large B-cell lymphoma, unspecified site: Secondary | ICD-10-CM

## 2013-02-02 DIAGNOSIS — Z5112 Encounter for antineoplastic immunotherapy: Secondary | ICD-10-CM

## 2013-02-02 MED ORDER — DIPHENHYDRAMINE HCL 25 MG PO CAPS
50.0000 mg | ORAL_CAPSULE | Freq: Once | ORAL | Status: AC
  Administered 2013-02-02: 50 mg via ORAL

## 2013-02-02 MED ORDER — SODIUM CHLORIDE 0.9 % IV SOLN
Freq: Once | INTRAVENOUS | Status: AC
  Administered 2013-02-02: 08:00:00 via INTRAVENOUS

## 2013-02-02 MED ORDER — DIPHENHYDRAMINE HCL 25 MG PO CAPS
ORAL_CAPSULE | ORAL | Status: AC
  Filled 2013-02-02: qty 2

## 2013-02-02 MED ORDER — DEXAMETHASONE SODIUM PHOSPHATE 20 MG/5ML IJ SOLN
INTRAMUSCULAR | Status: AC
  Filled 2013-02-02: qty 5

## 2013-02-02 MED ORDER — SODIUM CHLORIDE 0.9 % IV SOLN
375.0000 mg/m2 | Freq: Once | INTRAVENOUS | Status: AC
  Administered 2013-02-02: 800 mg via INTRAVENOUS
  Filled 2013-02-02: qty 80

## 2013-02-02 MED ORDER — SODIUM CHLORIDE 0.9 % IV SOLN
150.0000 mg | Freq: Once | INTRAVENOUS | Status: AC
  Administered 2013-02-02: 150 mg via INTRAVENOUS
  Filled 2013-02-02: qty 5

## 2013-02-02 MED ORDER — ACETAMINOPHEN 325 MG PO TABS
ORAL_TABLET | ORAL | Status: AC
  Filled 2013-02-02: qty 2

## 2013-02-02 MED ORDER — SODIUM CHLORIDE 0.9 % IV SOLN
37.5000 mg/m2 | Freq: Once | INTRAVENOUS | Status: AC
  Administered 2013-02-02: 77 mg via INTRAVENOUS
  Filled 2013-02-02: qty 77

## 2013-02-02 MED ORDER — ACETAMINOPHEN 325 MG PO TABS
650.0000 mg | ORAL_TABLET | Freq: Once | ORAL | Status: AC
  Administered 2013-02-02: 650 mg via ORAL

## 2013-02-02 MED ORDER — SODIUM CHLORIDE 0.9 % IV SOLN
750.0000 mg/m2 | Freq: Once | INTRAVENOUS | Status: AC
  Administered 2013-02-02: 1520 mg via INTRAVENOUS
  Filled 2013-02-02: qty 40

## 2013-02-02 MED ORDER — PALONOSETRON HCL INJECTION 0.25 MG/5ML
INTRAVENOUS | Status: AC
  Filled 2013-02-02: qty 5

## 2013-02-02 MED ORDER — POTASSIUM CHLORIDE 2 MEQ/ML IV SOLN
Freq: Once | INTRAVENOUS | Status: AC
  Administered 2013-02-02: 08:00:00 via INTRAVENOUS
  Filled 2013-02-02: qty 10

## 2013-02-02 MED ORDER — DEXAMETHASONE SODIUM PHOSPHATE 20 MG/5ML IJ SOLN
12.0000 mg | Freq: Once | INTRAMUSCULAR | Status: AC
  Administered 2013-02-02: 12 mg via INTRAVENOUS

## 2013-02-02 MED ORDER — HEPARIN SOD (PORK) LOCK FLUSH 100 UNIT/ML IV SOLN
500.0000 [IU] | Freq: Once | INTRAVENOUS | Status: AC | PRN
  Administered 2013-02-02: 500 [IU]
  Filled 2013-02-02: qty 5

## 2013-02-02 MED ORDER — PALONOSETRON HCL INJECTION 0.25 MG/5ML
0.2500 mg | Freq: Once | INTRAVENOUS | Status: AC
  Administered 2013-02-02: 0.25 mg via INTRAVENOUS

## 2013-02-02 MED ORDER — SODIUM CHLORIDE 0.9 % IJ SOLN
10.0000 mL | INTRAMUSCULAR | Status: DC | PRN
  Administered 2013-02-02: 10 mL
  Filled 2013-02-02: qty 10

## 2013-02-02 NOTE — Progress Notes (Signed)
Chaplain made initial visit with patient in infusion room. Patient was friendly and seemed to be in good spirits. She talked at length about her husband, who is currently in a nearby nursing home, and her son, who suffered from an aneurism and a stroke many years ago. We talked about the challenge of being a caregiver while also having treatment. 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 Patient repeatedly stated the importance of "having a plan in case something happens," and said that she and her husband have already purchased grave plots. She says she does not dwell on her cancer, but maintains a good attitude, stating, "that's all you can do." Chaplain practiced active listening and empathic presence.

## 2013-02-02 NOTE — Patient Instructions (Signed)
[facility] Discharge Instructions for Patients Receiving Chemotherapy  Today you received the following chemotherapy agents Rituxan, Gemzar and Cisplatin.  To help prevent nausea and vomiting after your treatment, we encourage you to take your nausea medication.   If you develop nausea and vomiting that is not controlled by your nausea medication, call the clinic.   BELOW ARE SYMPTOMS THAT SHOULD BE REPORTED IMMEDIATELY:  *FEVER GREATER THAN 100.5 F  *CHILLS WITH OR WITHOUT FEVER  NAUSEA AND VOMITING THAT IS NOT CONTROLLED WITH YOUR NAUSEA MEDICATION  *UNUSUAL SHORTNESS OF BREATH  *UNUSUAL BRUISING OR BLEEDING  TENDERNESS IN MOUTH AND THROAT WITH OR WITHOUT PRESENCE OF ULCERS  *URINARY PROBLEMS  *BOWEL PROBLEMS  UNUSUAL RASH Items with * indicate a potential emergency and should be followed up as soon as possible.  Feel free to call the clinic you have any questions or concerns. The clinic phone number is [phone].

## 2013-02-03 ENCOUNTER — Telehealth: Payer: Self-pay

## 2013-02-03 NOTE — Telephone Encounter (Signed)
Pt called and clarified she is to take her decadron 40 mg today, tomorrow and Sunday. This was confirmed.

## 2013-02-08 ENCOUNTER — Encounter: Payer: Self-pay | Admitting: Internal Medicine

## 2013-02-08 ENCOUNTER — Other Ambulatory Visit: Payer: Medicare Other | Admitting: Lab

## 2013-02-09 ENCOUNTER — Ambulatory Visit (HOSPITAL_BASED_OUTPATIENT_CLINIC_OR_DEPARTMENT_OTHER): Payer: Medicare Other

## 2013-02-09 ENCOUNTER — Ambulatory Visit: Payer: Medicare Other

## 2013-02-09 ENCOUNTER — Other Ambulatory Visit (HOSPITAL_BASED_OUTPATIENT_CLINIC_OR_DEPARTMENT_OTHER): Payer: Medicare Other | Admitting: Lab

## 2013-02-09 ENCOUNTER — Telehealth: Payer: Self-pay

## 2013-02-09 VITALS — BP 150/81 | HR 71 | Temp 97.5°F

## 2013-02-09 DIAGNOSIS — C8590 Non-Hodgkin lymphoma, unspecified, unspecified site: Secondary | ICD-10-CM

## 2013-02-09 DIAGNOSIS — Z5111 Encounter for antineoplastic chemotherapy: Secondary | ICD-10-CM

## 2013-02-09 DIAGNOSIS — C8330 Diffuse large B-cell lymphoma, unspecified site: Secondary | ICD-10-CM

## 2013-02-09 DIAGNOSIS — C8589 Other specified types of non-Hodgkin lymphoma, extranodal and solid organ sites: Secondary | ICD-10-CM

## 2013-02-09 LAB — CBC WITH DIFFERENTIAL/PLATELET
BASO%: 1 % (ref 0.0–2.0)
Basophils Absolute: 0 10*3/uL (ref 0.0–0.1)
EOS%: 0.9 % (ref 0.0–7.0)
Eosinophils Absolute: 0 10*3/uL (ref 0.0–0.5)
HCT: 37.3 % (ref 34.8–46.6)
LYMPH%: 15.2 % (ref 14.0–49.7)
MCHC: 33.8 g/dL (ref 31.5–36.0)
MCV: 95.7 fL (ref 79.5–101.0)
MONO#: 0 10*3/uL — ABNORMAL LOW (ref 0.1–0.9)
MONO%: 0.3 % (ref 0.0–14.0)
NEUT#: 3.9 10*3/uL (ref 1.5–6.5)
NEUT%: 82.6 % — ABNORMAL HIGH (ref 38.4–76.8)
Platelets: 134 10*3/uL — ABNORMAL LOW (ref 145–400)
RBC: 3.89 10*6/uL (ref 3.70–5.45)
RDW: 19.2 % — ABNORMAL HIGH (ref 11.2–14.5)
WBC: 4.7 10*3/uL (ref 3.9–10.3)

## 2013-02-09 LAB — COMPREHENSIVE METABOLIC PANEL (CC13)
ALT: 45 U/L (ref 0–55)
AST: 19 U/L (ref 5–34)
Albumin: 3.8 g/dL (ref 3.5–5.0)
Alkaline Phosphatase: 52 U/L (ref 40–150)
Anion Gap: 11 mEq/L (ref 3–11)
BUN: 37.1 mg/dL — ABNORMAL HIGH (ref 7.0–26.0)
Calcium: 10.1 mg/dL (ref 8.4–10.4)
Glucose: 98 mg/dl (ref 70–140)
Potassium: 4.7 mEq/L (ref 3.5–5.1)
Sodium: 138 mEq/L (ref 136–145)
Total Bilirubin: 0.69 mg/dL (ref 0.20–1.20)
Total Protein: 7 g/dL (ref 6.4–8.3)

## 2013-02-09 MED ORDER — PROCHLORPERAZINE MALEATE 10 MG PO TABS
10.0000 mg | ORAL_TABLET | Freq: Once | ORAL | Status: AC
  Administered 2013-02-09: 10 mg via ORAL

## 2013-02-09 MED ORDER — SODIUM CHLORIDE 0.9 % IV SOLN
750.0000 mg/m2 | Freq: Once | INTRAVENOUS | Status: AC
  Administered 2013-02-09: 1520 mg via INTRAVENOUS
  Filled 2013-02-09: qty 39.98

## 2013-02-09 MED ORDER — PROCHLORPERAZINE MALEATE 10 MG PO TABS
ORAL_TABLET | ORAL | Status: AC
  Filled 2013-02-09: qty 1

## 2013-02-09 MED ORDER — HEPARIN SOD (PORK) LOCK FLUSH 100 UNIT/ML IV SOLN
500.0000 [IU] | Freq: Once | INTRAVENOUS | Status: AC | PRN
  Administered 2013-02-09: 500 [IU]
  Filled 2013-02-09: qty 5

## 2013-02-09 MED ORDER — SODIUM CHLORIDE 0.9 % IV SOLN
Freq: Once | INTRAVENOUS | Status: AC
  Administered 2013-02-09: 10:00:00 via INTRAVENOUS

## 2013-02-09 MED ORDER — SODIUM CHLORIDE 0.9 % IJ SOLN
10.0000 mL | INTRAMUSCULAR | Status: DC | PRN
Start: 2013-02-09 — End: 2013-02-09
  Administered 2013-02-09: 10 mL
  Filled 2013-02-09: qty 10

## 2013-02-09 NOTE — Progress Notes (Signed)
Chaplain made follow-up visit with patient whom she spoke with last week. Patient was alert and eating soup at the time of visit. She stated that this infusion will only take an hour and a half, which she seemed glad about. Patient stated that it's difficult for her to recover from chemo and that she needs to rest a lot in the days after each one. She stated that she has not been able to visit either her husband or her son, who both live in different assisted living facilities, because she has "no one to take her." Patient went on to say that she has trouble finding anyone to help her with errands and chores. She stated that she has helped people her whole life, but when she needs help, no one will assist her. Patient described her family situation and her sister's children, whom she helped raise, and how they refuse to help her with minor errands, even when she offers to pay them. This seems to be an ongoing frustration for her. Chaplain practiced active listening and empathic presence. Patient told chaplain her next visit date so that we could chat again.

## 2013-02-09 NOTE — Telephone Encounter (Signed)
Several calls between Dr Oneta Rack and return call to pt. Clarified no coumadin tonight and 1/2 thyroid pill today and 1&1/2 magnesium pills today. Pt will be by [facility] tomorrow and will be given a calendar with coumadin and thyroid schedule. Dr Oneta Rack prescribed 1/2 coumadin T,Th, S,S and 1 coumadin M,W,F. He prescribed 1/2 thyroid T,Th, S, S and 1 thyroid M,W,F. Pt could not comprehend this. Also clarified pt is supposed to be taking an antibiotic but Dr Kathryne Sharper office sent Rx to wrong pharmacy. They will resent RX.

## 2013-02-09 NOTE — Patient Instructions (Signed)
[facility] Discharge Instructions for Patients Receiving Chemotherapy  Today you received the following chemotherapy agents gemzar  To help prevent nausea and vomiting after your treatment, we encourage you to take your nausea medication as needed   If you develop nausea and vomiting that is not controlled by your nausea medication, call the clinic.   BELOW ARE SYMPTOMS THAT SHOULD BE REPORTED IMMEDIATELY:  *FEVER GREATER THAN 100.5 F  *CHILLS WITH OR WITHOUT FEVER  NAUSEA AND VOMITING THAT IS NOT CONTROLLED WITH YOUR NAUSEA MEDICATION  *UNUSUAL SHORTNESS OF BREATH  *UNUSUAL BRUISING OR BLEEDING  TENDERNESS IN MOUTH AND THROAT WITH OR WITHOUT PRESENCE OF ULCERS  *URINARY PROBLEMS  *BOWEL PROBLEMS  UNUSUAL RASH Items with * indicate a potential emergency and should be followed up as soon as possible.  Feel free to call the clinic you have any questions or concerns. The clinic phone number is [phone].    

## 2013-02-09 NOTE — Progress Notes (Signed)
Pt asked for information regarding how to take her coumadin.  Pt states her PCP oversees her coumadin.  Manfred Arch, Dr Chism's RN will call her PCP's office to clarify instructions for coumadin and will call patient with instructions.  SLJ

## 2013-02-10 ENCOUNTER — Ambulatory Visit (HOSPITAL_BASED_OUTPATIENT_CLINIC_OR_DEPARTMENT_OTHER): Payer: Medicare Other

## 2013-02-10 VITALS — BP 98/64 | HR 82 | Temp 98.6°F

## 2013-02-10 DIAGNOSIS — C8590 Non-Hodgkin lymphoma, unspecified, unspecified site: Secondary | ICD-10-CM

## 2013-02-10 DIAGNOSIS — Z5189 Encounter for other specified aftercare: Secondary | ICD-10-CM

## 2013-02-10 DIAGNOSIS — C8589 Other specified types of non-Hodgkin lymphoma, extranodal and solid organ sites: Secondary | ICD-10-CM

## 2013-02-10 DIAGNOSIS — C8330 Diffuse large B-cell lymphoma, unspecified site: Secondary | ICD-10-CM

## 2013-02-10 MED ORDER — PEGFILGRASTIM INJECTION 6 MG/0.6ML
6.0000 mg | Freq: Once | SUBCUTANEOUS | Status: AC
  Administered 2013-02-10: 6 mg via SUBCUTANEOUS
  Filled 2013-02-10: qty 0.6

## 2013-02-11 ENCOUNTER — Inpatient Hospital Stay (HOSPITAL_COMMUNITY)
Admission: EM | Admit: 2013-02-11 | Discharge: 2013-02-22 | DRG: 809 | Disposition: A | Discharge status: Home Health | Payer: Medicare Other | Attending: Internal Medicine | Admitting: Internal Medicine

## 2013-02-11 ENCOUNTER — Other Ambulatory Visit: Payer: Self-pay

## 2013-02-11 ENCOUNTER — Emergency Department (HOSPITAL_COMMUNITY): Payer: Medicare Other

## 2013-02-11 ENCOUNTER — Encounter (HOSPITAL_COMMUNITY): Payer: Self-pay | Admitting: Emergency Medicine

## 2013-02-11 DIAGNOSIS — Z9221 Personal history of antineoplastic chemotherapy: Secondary | ICD-10-CM

## 2013-02-11 DIAGNOSIS — R040 Epistaxis: Secondary | ICD-10-CM | POA: Diagnosis not present

## 2013-02-11 DIAGNOSIS — C8590 Non-Hodgkin lymphoma, unspecified, unspecified site: Secondary | ICD-10-CM

## 2013-02-11 DIAGNOSIS — Z923 Personal history of irradiation: Secondary | ICD-10-CM

## 2013-02-11 DIAGNOSIS — E785 Hyperlipidemia, unspecified: Secondary | ICD-10-CM | POA: Diagnosis present

## 2013-02-11 DIAGNOSIS — E669 Obesity, unspecified: Secondary | ICD-10-CM | POA: Diagnosis present

## 2013-02-11 DIAGNOSIS — F3289 Other specified depressive episodes: Secondary | ICD-10-CM | POA: Diagnosis present

## 2013-02-11 DIAGNOSIS — N179 Acute kidney failure, unspecified: Secondary | ICD-10-CM | POA: Diagnosis present

## 2013-02-11 DIAGNOSIS — D638 Anemia in other chronic diseases classified elsewhere: Secondary | ICD-10-CM | POA: Diagnosis present

## 2013-02-11 DIAGNOSIS — Z87891 Personal history of nicotine dependence: Secondary | ICD-10-CM

## 2013-02-11 DIAGNOSIS — IMO0001 Reserved for inherently not codable concepts without codable children: Secondary | ICD-10-CM | POA: Diagnosis present

## 2013-02-11 DIAGNOSIS — T380X5A Adverse effect of glucocorticoids and synthetic analogues, initial encounter: Secondary | ICD-10-CM | POA: Diagnosis present

## 2013-02-11 DIAGNOSIS — R5081 Fever presenting with conditions classified elsewhere: Secondary | ICD-10-CM | POA: Diagnosis present

## 2013-02-11 DIAGNOSIS — C8589 Other specified types of non-Hodgkin lymphoma, extranodal and solid organ sites: Secondary | ICD-10-CM | POA: Diagnosis present

## 2013-02-11 DIAGNOSIS — I4891 Unspecified atrial fibrillation: Secondary | ICD-10-CM | POA: Diagnosis present

## 2013-02-11 DIAGNOSIS — I495 Sick sinus syndrome: Secondary | ICD-10-CM | POA: Diagnosis present

## 2013-02-11 DIAGNOSIS — E039 Hypothyroidism, unspecified: Secondary | ICD-10-CM | POA: Diagnosis present

## 2013-02-11 DIAGNOSIS — F32A Depression, unspecified: Secondary | ICD-10-CM | POA: Diagnosis present

## 2013-02-11 DIAGNOSIS — I10 Essential (primary) hypertension: Secondary | ICD-10-CM | POA: Diagnosis present

## 2013-02-11 DIAGNOSIS — Z79899 Other long term (current) drug therapy: Secondary | ICD-10-CM

## 2013-02-11 DIAGNOSIS — Z7901 Long term (current) use of anticoagulants: Secondary | ICD-10-CM

## 2013-02-11 DIAGNOSIS — Z6841 Body Mass Index (BMI) 40.0 and over, adult: Secondary | ICD-10-CM

## 2013-02-11 DIAGNOSIS — F329 Major depressive disorder, single episode, unspecified: Secondary | ICD-10-CM

## 2013-02-11 DIAGNOSIS — C8593 Non-Hodgkin lymphoma, unspecified, intra-abdominal lymph nodes: Secondary | ICD-10-CM | POA: Diagnosis present

## 2013-02-11 DIAGNOSIS — T451X5A Adverse effect of antineoplastic and immunosuppressive drugs, initial encounter: Principal | ICD-10-CM | POA: Diagnosis present

## 2013-02-11 DIAGNOSIS — E871 Hypo-osmolality and hyponatremia: Secondary | ICD-10-CM | POA: Diagnosis present

## 2013-02-11 DIAGNOSIS — E1169 Type 2 diabetes mellitus with other specified complication: Secondary | ICD-10-CM

## 2013-02-11 DIAGNOSIS — C8330 Diffuse large B-cell lymphoma, unspecified site: Secondary | ICD-10-CM | POA: Diagnosis present

## 2013-02-11 DIAGNOSIS — Z9181 History of falling: Secondary | ICD-10-CM

## 2013-02-11 DIAGNOSIS — D630 Anemia in neoplastic disease: Secondary | ICD-10-CM | POA: Diagnosis present

## 2013-02-11 DIAGNOSIS — D709 Neutropenia, unspecified: Secondary | ICD-10-CM | POA: Diagnosis present

## 2013-02-11 DIAGNOSIS — Z950 Presence of cardiac pacemaker: Secondary | ICD-10-CM | POA: Diagnosis present

## 2013-02-11 DIAGNOSIS — D61810 Antineoplastic chemotherapy induced pancytopenia: Secondary | ICD-10-CM

## 2013-02-11 MED ORDER — SODIUM CHLORIDE 0.9 % IV BOLUS (SEPSIS)
500.0000 mL | Freq: Once | INTRAVENOUS | Status: AC
  Administered 2013-02-11: 500 mL via INTRAVENOUS

## 2013-02-11 MED ORDER — ONDANSETRON HCL 4 MG/2ML IJ SOLN
4.0000 mg | Freq: Once | INTRAMUSCULAR | Status: AC
  Administered 2013-02-11: 4 mg via INTRAVENOUS
  Filled 2013-02-11: qty 2

## 2013-02-11 MED ORDER — ACETAMINOPHEN 325 MG PO TABS
650.0000 mg | ORAL_TABLET | Freq: Once | ORAL | Status: AC
  Administered 2013-02-11: 650 mg via ORAL
  Filled 2013-02-11: qty 2

## 2013-02-11 NOTE — ED Provider Notes (Signed)
CSN: [id number]     Arrival date & time 02/11/13  2150 History   First MD Initiated Contact with Patient 02/11/13 2257     Chief Complaint  Patient presents with  . Fever   (Consider location/radiation/quality/duration/timing/severity/associated sxs/prior Treatment) HPI Comments: Chemo 2 days ago  Patient is a 72 y.o. female presenting with fever.  Fever Max temp prior to arrival:  104 Temp source:  Oral Severity:  Severe Onset quality:  Sudden Timing:  Intermittent Progression:  Waxing and waning Chronicity:  New Relieved by:  Nothing Worsened by:  Nothing tried Associated symptoms: chills, nausea and vomiting   Associated symptoms: no cough, no rash and no rhinorrhea     Past Medical History  Diagnosis Date  . Sick sinus syndrome   . Hematoma     At the site of the pacemaker insertion.  . Hypotension   . Dyslipidemia   . Diabetes mellitus   . Pacemaker   . History of atrial fibrillation   . Arthritis     Osteoarthritis  . Hypothyroidism   . Port-a-cath in place 12/08/10  . Status post chemotherapy completed 03/2011    R - CHOP q 3 weeks x 6  . S/P radiation therapy 05/28/2011 - 06/26/2011    Right Abdomen and Right Pelvis/3060 cGy in 17 Fractions  . Diffuse large B cell lymphoma   . Cancer 10/2010    large B cell lymphoma  . 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 Pacemaker insertion      Lead revision completed August 04, 2006  . Cholecystectomy    . Tubal ligation      Bilateral  . Insert / replace / remove pacemaker    . Abdominal hysterectomy    . Biopsy stomach  11/03/10    Soft Tissue Mass, Biopsy, Right Lower Quadrant Mesenteric Mass- High Grade Non-Hodgkins B Cell Lymphoma  with Flow Cytometry  . Bone biopsy  11/24/10    Bone Marrow, Aspirate Biospy, and clot, Left - No Involvement of Non-Hodgkin's Lymphoma Identified   Family History  Problem Relation Age of  Onset  . Stroke Mother   . Stroke Father   . Aneurysm Son     Brain  . Cancer Sister      1 sister had vaginal cancer  . Cancer Brother     Prostate   History  Substance Use Topics  . Smoking status: Former Smoker -- 1.50 packs/day for 30 years    Types: Cigarettes    Quit date: 03/14/2011  . Smokeless tobacco: Never Used  . Alcohol Use: No   OB History   Grav Para Term Preterm Abortions TAB SAB Ect Mult Living                 Review of Systems  Constitutional: Positive for fever (up to 104 at home) and chills.  HENT: Negative for rhinorrhea.   Respiratory: Negative for cough and shortness of breath.   Gastrointestinal: Positive for nausea and vomiting. Negative for abdominal pain.  Skin: Negative for rash.  All other systems reviewed and are negative.    Allergies  Codeine  Home Medications   Current Outpatient Rx  Name  Route  Sig  Dispense  Refill  . albuterol (PROVENTIL) (2.5 MG/3ML) 0.083% nebulizer solution   Nebulization   Take 2.5 mg by nebulization every 6 (six) hours as needed for shortness of breath.          Marland Kitchen  allopurinol (ZYLOPRIM) 300 MG tablet   Oral   Take 300 mg by mouth daily.         . Cholecalciferol (VITAMIN D3) 2000 UNITS TABS   Oral   Take 2,000 Units by mouth 2 (two) times daily.          . citalopram (CELEXA) 20 MG tablet   Oral   Take 20 mg by mouth every morning.          Marland Kitchen dexamethasone (DECADRON) 4 MG tablet   Oral   Take 10 tablets (40 mg total) by mouth daily.   30 tablet   2     Dexamethasone 40mg  (ten of the 4mg  tablets) once d ...   . diltiazem (CARDIZEM CD) 180 MG 24 hr capsule   Oral   Take 180 mg by mouth every morning.          Marland Kitchen levothyroxine (SYNTHROID, LEVOTHROID) 100 MCG tablet   Oral   Take 50 mcg by mouth every morning.          . lidocaine-prilocaine (EMLA) cream   Topical   Apply topically as needed. Apply to port a cath site one hour before needle stick as needed.   30 g   2   .  loperamide (IMODIUM) 2 MG capsule   Oral   Take 2 mg by mouth 4 (four) times daily as needed for diarrhea or loose stools.         . magnesium oxide (MAG-OX) 400 (241.3 MG) MG tablet   Oral   Take 200 mg by mouth daily.         . metFORMIN (GLUCOPHAGE-XR) 500 MG 24 hr tablet   Oral   Take 1,000 mg by mouth 2 (two) times daily.         . ondansetron (ZOFRAN) 8 MG tablet   Oral   Take 1 tablet (8 mg total) by mouth every 12 (twelve) hours as needed for nausea.   20 tablet   3   . potassium chloride (K-DUR) 10 MEQ tablet   Oral   Take 20 mEq by mouth daily. Take 2 tablets my mouth daily         . pravastatin (PRAVACHOL) 40 MG tablet   Oral   Take 40 mg by mouth at bedtime.          . prochlorperazine (COMPAZINE) 10 MG tablet   Oral   Take 10 mg by mouth every 6 (six) hours as needed (nausea).         . sulfamethoxazole-trimethoprim (BACTRIM DS) 800-160 MG per tablet   Oral   Take 1 tablet by mouth 2 (two) times daily.         Marland Kitchen warfarin (COUMADIN) 6 MG tablet   Oral   Take 3-6 mg by mouth daily. 3mg  T,Th, Sat,Sun and 6mg  MWF          BP 114/67  Pulse 55  Temp(Src) 100.8 F (38.2 C) (Rectal)  Resp 24  SpO2 98% Physical Exam  Nursing note and vitals reviewed. Constitutional: She is oriented to person, place, and time. She appears well-developed and well-nourished. No distress.  HENT:  Head: Normocephalic and atraumatic.  Eyes: EOM are normal. Pupils are equal, round, and reactive to light.  Neck: Normal range of motion. Neck supple.  Cardiovascular: Normal rate and regular rhythm.  Exam reveals no friction rub.   No murmur heard. Pulmonary/Chest: Effort normal. No respiratory distress. She has no wheezes. She has rhonchi (  mild, diffuse in lower fields). She has no rales.  Abdominal: Soft. She exhibits no distension. There is no tenderness. There is no rebound.  Musculoskeletal: Normal range of motion. She exhibits no edema.  Neurological: She is  alert and oriented to person, place, and time.  Skin: She is not diaphoretic.    ED Course  Procedures (including critical care time) Labs Review Labs Reviewed  CULTURE, BLOOD (ROUTINE X 2)  CULTURE, BLOOD (ROUTINE X 2)  URINE CULTURE  CBC  COMPREHENSIVE METABOLIC PANEL  LIPASE, BLOOD  LACTIC ACID, PLASMA  URINALYSIS, ROUTINE W REFLEX MICROSCOPIC   Imaging Review No results found.  EKG Interpretation     Ventricular Rate:    PR Interval:    QRS Duration:   QT Interval:    QTC Calculation:   R Axis:     Text Interpretation:              MDM   1. Fever and neutropenia   2. A-fib   3. Diabetes mellitus type 2 in obese   4. Diffuse large B cell lymphoma   5. Non-Hodgkin lymphoma   6. NHL (non-Hodgkin's lymphoma)   7. Hypomagnesemia    72 year old female with history of abdominal lymphoma who is currently on chemotherapy presents with fever. Last chemotherapy 2 days ago. Patient had high fever at home of 104. Ears 100.8 rectally. She stated some mild nausea and multiple episodes of vomiting. No hematemesis. Now she is feeling well. She denies any chest pain, shortness of breath, abdominal pain. She has had some abdominal pain in her left lower quadrant which is coughing, but none at rest. She states she's had a breast or symptoms the past several days. When I examined her, she is a very deep, wet cough. Here her blood pressure is normal heart rate is in the 90s. She is in A. fib on the monitor and satting in the high 90s with good waveform without oxygen. Her lungs are significant for rhonchi. She is not a distress. Belly is nontender. Will check cultures, labs, chest x-ray. No history of neutropenic fever in her history that I can find. Labs show WBC of 2.4, thrombocytopenia, urinalysis normal. CXR normal. Medicine admitting, cefepime given by me as recommended by medicine.      Dagmar Hait, MD 02/12/13 [phone]

## 2013-02-11 NOTE — ED Notes (Signed)
Per EMS: Pt from home with a high fever. Hx of lymphoma of the abd. EMS unable to read temp vital. Vomiting prior to coming in facility.

## 2013-02-11 NOTE — ED Notes (Signed)
Bed: JY78 Expected date:  Expected time:  Means of arrival:  Comments: EMS/72 yo female with 104 temp-cancer pt

## 2013-02-12 ENCOUNTER — Encounter (HOSPITAL_COMMUNITY): Payer: Self-pay | Admitting: Oncology

## 2013-02-12 DIAGNOSIS — D638 Anemia in other chronic diseases classified elsewhere: Secondary | ICD-10-CM

## 2013-02-12 DIAGNOSIS — N179 Acute kidney failure, unspecified: Secondary | ICD-10-CM

## 2013-02-12 DIAGNOSIS — E669 Obesity, unspecified: Secondary | ICD-10-CM

## 2013-02-12 DIAGNOSIS — D61810 Antineoplastic chemotherapy induced pancytopenia: Secondary | ICD-10-CM | POA: Diagnosis present

## 2013-02-12 DIAGNOSIS — T451X5A Adverse effect of antineoplastic and immunosuppressive drugs, initial encounter: Secondary | ICD-10-CM | POA: Diagnosis present

## 2013-02-12 DIAGNOSIS — E119 Type 2 diabetes mellitus without complications: Secondary | ICD-10-CM

## 2013-02-12 DIAGNOSIS — R5081 Fever presenting with conditions classified elsewhere: Secondary | ICD-10-CM

## 2013-02-12 DIAGNOSIS — I4891 Unspecified atrial fibrillation: Secondary | ICD-10-CM

## 2013-02-12 DIAGNOSIS — C8589 Other specified types of non-Hodgkin lymphoma, extranodal and solid organ sites: Secondary | ICD-10-CM

## 2013-02-12 DIAGNOSIS — D709 Neutropenia, unspecified: Secondary | ICD-10-CM | POA: Diagnosis present

## 2013-02-12 LAB — URINALYSIS, ROUTINE W REFLEX MICROSCOPIC
Ketones, ur: NEGATIVE mg/dL
Leukocytes, UA: NEGATIVE
Nitrite: NEGATIVE
Protein, ur: NEGATIVE mg/dL
Urobilinogen, UA: 1 mg/dL (ref 0.0–1.0)
pH: 7 (ref 5.0–8.0)

## 2013-02-12 LAB — CBC
HCT: 26.9 % — ABNORMAL LOW (ref 36.0–46.0)
Hemoglobin: 10.3 g/dL — ABNORMAL LOW (ref 12.0–15.0)
Hemoglobin: 9.1 g/dL — ABNORMAL LOW (ref 12.0–15.0)
MCH: 32.2 pg (ref 26.0–34.0)
MCH: 32.9 pg (ref 26.0–34.0)
MCHC: 33.8 g/dL (ref 30.0–36.0)
MCHC: 34.8 g/dL (ref 30.0–36.0)
MCV: 94.6 fL (ref 78.0–100.0)
MCV: 95.1 fL (ref 78.0–100.0)
Platelets: 23 10*3/uL — CL (ref 150–400)
Platelets: 30 10*3/uL — ABNORMAL LOW (ref 150–400)
RBC: 2.83 MIL/uL — ABNORMAL LOW (ref 3.87–5.11)
RBC: 3.13 MIL/uL — ABNORMAL LOW (ref 3.87–5.11)
WBC: 1.5 10*3/uL — ABNORMAL LOW (ref 4.0–10.5)

## 2013-02-12 LAB — COMPREHENSIVE METABOLIC PANEL
ALT: 19 U/L (ref 0–35)
AST: 11 U/L (ref 0–37)
Albumin: 3.2 g/dL — ABNORMAL LOW (ref 3.5–5.2)
BUN: 20 mg/dL (ref 6–23)
CO2: 27 mEq/L (ref 19–32)
Calcium: 10.2 mg/dL (ref 8.4–10.5)
Creatinine, Ser: 1.31 mg/dL — ABNORMAL HIGH (ref 0.50–1.10)
GFR calc Af Amer: 46 mL/min — ABNORMAL LOW (ref >90)
GFR calc non Af Amer: 40 mL/min — ABNORMAL LOW (ref >90)
Glucose, Bld: 151 mg/dL — ABNORMAL HIGH (ref 70–99)
Total Protein: 6.3 g/dL (ref 6.0–8.3)

## 2013-02-12 LAB — GLUCOSE, CAPILLARY: Glucose-Capillary: 274 mg/dL — ABNORMAL HIGH (ref 70–99)

## 2013-02-12 LAB — BASIC METABOLIC PANEL
BUN: 21 mg/dL (ref 6–23)
Calcium: 9.7 mg/dL (ref 8.4–10.5)
Chloride: 99 mEq/L (ref 96–112)
Creatinine, Ser: 1.48 mg/dL — ABNORMAL HIGH (ref 0.50–1.10)
GFR calc non Af Amer: 34 mL/min — ABNORMAL LOW (ref >90)
Glucose, Bld: 137 mg/dL — ABNORMAL HIGH (ref 70–99)
Sodium: 133 mEq/L — ABNORMAL LOW (ref 135–145)

## 2013-02-12 LAB — LIPASE, BLOOD: Lipase: 38 U/L (ref 11–59)

## 2013-02-12 LAB — LACTIC ACID, PLASMA: Lactic Acid, Venous: 1.8 mmol/L (ref 0.5–2.2)

## 2013-02-12 LAB — PROTIME-INR: Prothrombin Time: 18 seconds — ABNORMAL HIGH (ref 11.6–15.2)

## 2013-02-12 MED ORDER — SODIUM CHLORIDE 0.9 % IJ SOLN
3.0000 mL | INTRAMUSCULAR | Status: AC | PRN
  Administered 2013-02-14: 3 mL

## 2013-02-12 MED ORDER — SODIUM CHLORIDE 0.9 % IJ SOLN
3.0000 mL | Freq: Two times a day (BID) | INTRAMUSCULAR | Status: DC
  Administered 2013-02-12: 3 mL via INTRAVENOUS

## 2013-02-12 MED ORDER — DEXAMETHASONE 6 MG PO TABS
40.0000 mg | ORAL_TABLET | Freq: Every day | ORAL | Status: DC
  Administered 2013-02-12 – 2013-02-17 (×6): 40 mg via ORAL
  Filled 2013-02-12 (×6): qty 1

## 2013-02-12 MED ORDER — CITALOPRAM HYDROBROMIDE 20 MG PO TABS
20.0000 mg | ORAL_TABLET | Freq: Every morning | ORAL | Status: DC
  Administered 2013-02-12 – 2013-02-22 (×11): 20 mg via ORAL
  Filled 2013-02-12 (×11): qty 1

## 2013-02-12 MED ORDER — METFORMIN HCL ER 500 MG PO TB24
1000.0000 mg | ORAL_TABLET | Freq: Two times a day (BID) | ORAL | Status: DC
  Administered 2013-02-12 – 2013-02-13 (×3): 1000 mg via ORAL
  Filled 2013-02-12 (×5): qty 2

## 2013-02-12 MED ORDER — ALLOPURINOL 300 MG PO TABS
300.0000 mg | ORAL_TABLET | Freq: Every day | ORAL | Status: DC
  Administered 2013-02-12 – 2013-02-22 (×11): 300 mg via ORAL
  Filled 2013-02-12 (×11): qty 1

## 2013-02-12 MED ORDER — DILTIAZEM HCL ER COATED BEADS 180 MG PO CP24
180.0000 mg | ORAL_CAPSULE | Freq: Every morning | ORAL | Status: DC
  Administered 2013-02-12 – 2013-02-22 (×11): 180 mg via ORAL
  Filled 2013-02-12 (×11): qty 1

## 2013-02-12 MED ORDER — SODIUM CHLORIDE 0.9 % IV SOLN
250.0000 mL | Freq: Once | INTRAVENOUS | Status: AC
  Administered 2013-02-12: 250 mL via INTRAVENOUS

## 2013-02-12 MED ORDER — SODIUM CHLORIDE 0.9 % IJ SOLN
10.0000 mL | INTRAMUSCULAR | Status: AC | PRN
  Administered 2013-02-19: 10 mL

## 2013-02-12 MED ORDER — ACETAMINOPHEN 325 MG PO TABS: 650.0000 mg | ORAL_TABLET | Freq: Four times a day (QID) | ORAL | Status: DC | PRN

## 2013-02-12 MED ORDER — MAGNESIUM OXIDE 400 (241.3 MG) MG PO TABS
200.0000 mg | ORAL_TABLET | Freq: Every day | ORAL | Status: DC
  Administered 2013-02-12 – 2013-02-22 (×11): 200 mg via ORAL
  Filled 2013-02-12 (×11): qty 0.5

## 2013-02-12 MED ORDER — LEVOTHYROXINE SODIUM 50 MCG PO TABS
50.0000 ug | ORAL_TABLET | Freq: Every day | ORAL | Status: DC
  Administered 2013-02-12 – 2013-02-22 (×11): 50 ug via ORAL
  Filled 2013-02-12 (×13): qty 1

## 2013-02-12 MED ORDER — ALBUTEROL SULFATE (5 MG/ML) 0.5% IN NEBU: 2.5000 mg | INHALATION_SOLUTION | Freq: Four times a day (QID) | RESPIRATORY_TRACT | Status: DC | PRN

## 2013-02-12 MED ORDER — DIPHENHYDRAMINE HCL 25 MG PO CAPS: 25.0000 mg | ORAL_CAPSULE | Freq: Once | ORAL | Status: DC

## 2013-02-12 MED ORDER — HEPARIN SOD (PORK) LOCK FLUSH 100 UNIT/ML IV SOLN: 500.0000 [IU] | Freq: Every day | INTRAVENOUS | Status: DC | PRN

## 2013-02-12 MED ORDER — LOPERAMIDE HCL 2 MG PO CAPS: 2.0000 mg | ORAL_CAPSULE | Freq: Four times a day (QID) | ORAL | Status: DC | PRN

## 2013-02-12 MED ORDER — DEXTROSE 5 % IV SOLN
2.0000 g | Freq: Two times a day (BID) | INTRAVENOUS | Status: DC
  Administered 2013-02-12 – 2013-02-18 (×13): 2 g via INTRAVENOUS
  Filled 2013-02-12 (×14): qty 2

## 2013-02-12 MED ORDER — SODIUM CHLORIDE 0.9 % IJ SOLN
10.0000 mL | INTRAMUSCULAR | Status: AC | PRN
  Administered 2013-02-20: 10 mL

## 2013-02-12 MED ORDER — PROCHLORPERAZINE MALEATE 10 MG PO TABS
10.0000 mg | ORAL_TABLET | Freq: Four times a day (QID) | ORAL | Status: DC | PRN
  Filled 2013-02-12: qty 1

## 2013-02-12 MED ORDER — HEPARIN SOD (PORK) LOCK FLUSH 100 UNIT/ML IV SOLN: 250.0000 [IU] | INTRAVENOUS | Status: DC | PRN

## 2013-02-12 MED ORDER — SODIUM CHLORIDE 0.9 % IV SOLN: 2.0000 g | Freq: Once | INTRAVENOUS | Status: DC

## 2013-02-12 MED ORDER — WARFARIN - PHARMACIST DOSING INPATIENT: Freq: Every day | Status: DC

## 2013-02-12 MED ORDER — PRAVASTATIN SODIUM 40 MG PO TABS
40.0000 mg | ORAL_TABLET | Freq: Every day | ORAL | Status: DC
  Administered 2013-02-12 – 2013-02-21 (×10): 40 mg via ORAL
  Filled 2013-02-12 (×12): qty 1

## 2013-02-12 MED ORDER — POTASSIUM CHLORIDE ER 10 MEQ PO TBCR
20.0000 meq | EXTENDED_RELEASE_TABLET | Freq: Every day | ORAL | Status: DC
  Administered 2013-02-12 – 2013-02-19 (×8): 20 meq via ORAL
  Filled 2013-02-12 (×9): qty 2

## 2013-02-12 MED ORDER — SIMVASTATIN 20 MG PO TABS
20.0000 mg | ORAL_TABLET | Freq: Every day | ORAL | Status: DC
  Filled 2013-02-12: qty 1

## 2013-02-12 MED ORDER — DEXTROSE 5 % IV SOLN
2.0000 g | Freq: Once | INTRAVENOUS | Status: AC
  Administered 2013-02-12: 02:00:00 2 g via INTRAVENOUS
  Filled 2013-02-12: qty 2

## 2013-02-12 MED ORDER — WARFARIN SODIUM 5 MG PO TABS
5.0000 mg | ORAL_TABLET | Freq: Once | ORAL | Status: DC
  Filled 2013-02-12: qty 1

## 2013-02-12 MED ORDER — ONDANSETRON HCL 8 MG PO TABS: 8.0000 mg | ORAL_TABLET | Freq: Two times a day (BID) | ORAL | Status: DC | PRN

## 2013-02-12 MED ORDER — ACETAMINOPHEN 325 MG PO TABS: 650.0000 mg | ORAL_TABLET | Freq: Once | ORAL | Status: DC

## 2013-02-12 NOTE — Progress Notes (Signed)
Patient platelets are 30 this AM. Claiborne Billings, NP notified.

## 2013-02-12 NOTE — Progress Notes (Signed)
ANTICOAGULATION CONSULT NOTE - Initial Consult  Pharmacy Consult for Warfarin Indication: atrial fibrillation  Allergies  Allergen Reactions  . Codeine Nausea Only    Patient Measurements: Height: 4\' 8"  (142.2 cm) Weight: 215 lb 2.7 oz (97.6 kg) IBW/kg (Calculated) : 36.3  Vital Signs: Temp: 98.4 F (36.9 C) (11/02 9811) Temp src: Oral (11/02 9147) BP: 119/64 mmHg (11/02 0934) Pulse Rate: 92 (11/02 0633)  Labs:  Recent Labs  02/11/13 2333 02/12/13 0510  HGB 10.3* 9.1*  HCT 29.6* 26.9*  PLT 30* 23*  LABPROT  --  18.0*  INR  --  1.53*  CREATININE 1.31* 1.48*    Estimated Creatinine Clearance: 33 ml/min (by C-G formula based on Cr of 1.48).   Medical History: Past Medical History  Diagnosis Date  . Sick sinus syndrome   . Hematoma     At the site of the pacemaker insertion.  . Hypotension   . Dyslipidemia   . Diabetes mellitus   . Pacemaker   . History of atrial fibrillation   . Arthritis     Osteoarthritis  . Hypothyroidism   . Port-a-cath in place 12/08/10  . Status post chemotherapy completed 03/2011    R - CHOP q 3 weeks x 6  . S/P radiation therapy 05/28/2011 - 06/26/2011    Right Abdomen and Right Pelvis/3060 cGy in 17 Fractions  . Diffuse large B cell lymphoma   . Cancer 10/2010    large B cell lymphoma  . 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  Patient is on chronic warfarin therapy for atrial fibrillation and pharmacy consulted to manage warfarin; however, patient is severely thrombocytopenic and MD holding warfarin for now. Platelets 23K today.  Note not yet at nadir for chemotherapy agents.  No overt bleeding noted.  INR subtherapeutic. SCDs ordered for VTE prophylaxis.  Goal of Therapy:  INR  2-3   Plan:  1.  Warfarin on hold for now per MD due to thrombocytopenia. 2.  Pharmacy will follow and resume warfarin when appropriate per MD.  Clance Boll 02/12/2013,12:07 PM

## 2013-02-12 NOTE — Progress Notes (Signed)
ANTIBIOTIC CONSULT NOTE - INITIAL  Pharmacy Consult for Cefepime Indication: Febrile neutropenia  Allergies  Allergen Reactions  . Codeine Nausea Only    Patient Measurements: Height: 4\' 8"  (142.2 cm) Weight: 219 lb (99.338 kg) IBW/kg (Calculated) : 36.3 Adjusted Body Weight:   Vital Signs: Temp: 100.8 F (38.2 C) (11/01 2210) Temp src: Rectal (11/01 2210) BP: 100/41 mmHg (11/01 2345) Pulse Rate: 94 (11/01 2345) Intake/Output from previous day:   Intake/Output from this shift:    Labs:  Recent Labs  02/09/13 0930 02/09/13 0930 02/11/13 2333  WBC 4.7  --  2.4*  HGB 12.6  --  10.3*  PLT 134*  --  30*  CREATININE  --  1.3* 1.31*   Estimated Creatinine Clearance: 37.7 ml/min (by C-G formula based on Cr of 1.31). No results found for this basename: VANCOTROUGH, VANCOPEAK, VANCORANDOM, GENTTROUGH, GENTPEAK, GENTRANDOM, TOBRATROUGH, TOBRAPEAK, TOBRARND, AMIKACINPEAK, AMIKACINTROU, AMIKACIN,  in the last 72 hours   Microbiology: No results found for this or any previous visit (from the past 720 hour(s)).  Medical History: Past Medical History  Diagnosis Date  . Sick sinus syndrome   . Hematoma     At the site of the pacemaker insertion.  . Hypotension   . Dyslipidemia   . Diabetes mellitus   . Pacemaker   . History of atrial fibrillation   . Arthritis     Osteoarthritis  . Hypothyroidism   . Port-a-cath in place 12/08/10  . Status post chemotherapy completed 03/2011    R - CHOP q 3 weeks x 6  . S/P radiation therapy 05/28/2011 - 06/26/2011    Right Abdomen and Right Pelvis/3060 cGy in 17 Fractions  . Diffuse large B cell lymphoma   . Cancer 10/2010    large B cell lymphoma  . 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 Non-Hodgkin's lymphoma of abdomen 05/20/2011    Medications:  Anti-infectives   Start     Dose/Rate Route Frequency Ordered Stop   02/12/13 1000  ceFEPIme (MAXIPIME) 2 g in dextrose 5 % 50 mL IVPB     2 g 100 mL/hr over 30 Minutes Intravenous Every 12  hours 02/12/13 0422     02/12/13 0200  ceFEPIme (MAXIPIME) 2 g in dextrose 5 % 50 mL IVPB     2 g 100 mL/hr over 30 Minutes Intravenous  Once 02/12/13 0151 02/12/13 0230     Assessment: Patient with febrile neutropenia.  Patient also has reduced renal function  Goal of Therapy:  Cefepime dosed based on patient weight and renal function   Plan:  Follow up culture results Cefepime 2gm iv q12hr  Aleene Davidson Crowford 02/12/2013,4:22 AM

## 2013-02-12 NOTE — H&P (Signed)
[facility] History and Physical  [name] ZOX:[id number] DOB: 1941/01/27 DOA: 02/11/2013  Referring physician: ED PCP: Nadean Corwin, MD  Chief Complaint: Fever  HPI: [name] is a 72 y.o. female who presents to the ED with fever.  Fever is occuring in the setting of chemotherapy just 2 days ago for DLBCL.  The patient states that before chemo she did have URI like symptoms but went ahead with chemotherapy anyhow.  After developing a fever of 100.4 degrees today at home she called her oncologist as she was instructed to.  Oncology instructed her to present to the ED.  She has had a mild cough, non productive, but not really any other symptoms at this time.  Review of Systems: 12 systems reviewed and otherwise negative.  Past Medical History  Diagnosis Date  . Sick sinus syndrome   . Hematoma     At the site of the pacemaker insertion.  . Hypotension   . Dyslipidemia   . Diabetes mellitus   . Pacemaker   . History of atrial fibrillation   . Arthritis     Osteoarthritis  . Hypothyroidism   . Port-a-cath in place 12/08/10  . Status post chemotherapy completed 03/2011    R - CHOP q 3 weeks x 6  . S/P radiation therapy 05/28/2011 - 06/26/2011    Right Abdomen and Right Pelvis/3060 cGy in 17 Fractions  . Diffuse large B cell lymphoma   . Cancer 10/2010    large B cell lymphoma  . 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 Pacemaker insertion      Lead revision completed August 04, 2006  . Cholecystectomy    . Tubal ligation      Bilateral  . Insert / replace / remove pacemaker    . Abdominal hysterectomy    . Biopsy stomach  11/03/10    Soft Tissue Mass, Biopsy, Right Lower Quadrant Mesenteric Mass- High Grade Non-Hodgkins B Cell Lymphoma  with Flow Cytometry  . Bone biopsy  11/24/10    Bone Marrow, Aspirate Biospy, and clot, Left - No Involvement of Non-Hodgkin's  Lymphoma Identified   Social History:  reports that she quit smoking about 23 months ago. Her smoking use included Cigarettes. She has a 45 pack-year smoking history. She has never used smokeless tobacco. She reports that she does not drink alcohol or use illicit drugs.   Allergies  Allergen Reactions  . Codeine Nausea Only    Family History  Problem Relation Age of Onset  . Stroke Mother   . Stroke Father   . Aneurysm Son     Brain  . Cancer Sister      1 sister had vaginal cancer  . Cancer Brother     Prostate    Prior to Admission medications   Medication Sig Start Date End Date Taking? Authorizing Provider  albuterol (PROVENTIL) (2.5 MG/3ML) 0.083% nebulizer solution Take 2.5 mg by nebulization every 6 (six) hours as needed for shortness of breath.    Yes Historical Provider, MD  allopurinol (ZYLOPRIM) 300 MG tablet Take 300 mg by mouth daily.   Yes Historical Provider, MD  Cholecalciferol (VITAMIN D3) 2000 UNITS TABS Take 2,000 Units by mouth 2 (two) times daily.    Yes Historical Provider, MD  citalopram (CELEXA) 20 MG tablet Take 20 mg by mouth every morning.    Yes Historical Provider, MD  dexamethasone (DECADRON) 4  MG tablet Take 10 tablets (40 mg total) by mouth daily. 01/31/13  Yes Myra Rude, MD  diltiazem (CARDIZEM CD) 180 MG 24 hr capsule Take 180 mg by mouth every morning.    Yes Historical Provider, MD  levothyroxine (SYNTHROID, LEVOTHROID) 100 MCG tablet Take 50 mcg by mouth every morning.    Yes Historical Provider, MD  lidocaine-prilocaine (EMLA) cream Apply topically as needed. Apply to port a cath site one hour before needle stick as needed. 11/29/12  Yes Myra Rude, MD  loperamide (IMODIUM) 2 MG capsule Take 2 mg by mouth 4 (four) times daily as needed for diarrhea or loose stools.   Yes Historical Provider, MD  magnesium oxide (MAG-OX) 400 (241.3 MG) MG tablet Take 200 mg by mouth daily. 01/30/13  Yes Myra Rude, MD  metFORMIN (GLUCOPHAGE-XR) 500 MG 24  hr tablet Take 1,000 mg by mouth 2 (two) times daily.   Yes Historical Provider, MD  ondansetron (ZOFRAN) 8 MG tablet Take 1 tablet (8 mg total) by mouth every 12 (twelve) hours as needed for nausea. 06/30/12  Yes Exie Parody, MD  potassium chloride (K-DUR) 10 MEQ tablet Take 20 mEq by mouth daily. Take 2 tablets my mouth daily 01/30/13  Yes Myra Rude, MD  pravastatin (PRAVACHOL) 40 MG tablet Take 40 mg by mouth at bedtime.    Yes Historical Provider, MD  prochlorperazine (COMPAZINE) 10 MG tablet Take 10 mg by mouth every 6 (six) hours as needed (nausea).   Yes Historical Provider, MD  sulfamethoxazole-trimethoprim (BACTRIM DS) 800-160 MG per tablet Take 1 tablet by mouth 2 (two) times daily.   Yes Historical Provider, MD  warfarin (COUMADIN) 6 MG tablet Take 3-6 mg by mouth daily. 3mg  T,Th, Sat,Sun and 6mg  MWF   Yes Historical Provider, MD   Physical Exam: Filed Vitals:   02/11/13 2345  BP: 100/41  Pulse: 94  Temp:   Resp: 18    General:  NAD, resting comfortably in bed Eyes: PEERLA EOMI ENT: mucous membranes moist Neck: supple w/o JVD Cardiovascular: RRR w/o MRG Respiratory: CTA B Abdomen: soft, nt, nd, bs+ Skin: no rash nor lesion Musculoskeletal: MAE, full ROM all 4 extremities Psychiatric: normal tone and affect Neurologic: AAOx3, grossly non-focal  Labs on Admission:  Basic Metabolic Panel:  Recent Labs Lab 02/09/13 0930 02/11/13 2333  NA 138 130*  K 4.7 4.7  CL  --  93*  CO2 29 27  GLUCOSE 98 151*  BUN 37.1* 20  CREATININE 1.3* 1.31*  CALCIUM 10.1 10.2   Liver Function Tests:  Recent Labs Lab 02/09/13 0930 02/11/13 2333  AST 19 11  ALT 45 19  ALKPHOS 52 49  BILITOT 0.69 0.5  PROT 7.0 6.3  ALBUMIN 3.8 3.2*    Recent Labs Lab 02/11/13 2333  LIPASE 38   No results found for this basename: AMMONIA,  in the last 168 hours CBC:  Recent Labs Lab 02/09/13 0930 02/11/13 2333  WBC 4.7 2.4*  NEUTROABS 3.9  --   HGB 12.6 10.3*  HCT 37.3 29.6*  MCV  95.7 94.6  PLT 134* 30*   Cardiac Enzymes: No results found for this basename: CKTOTAL, CKMB, CKMBINDEX, TROPONINI,  in the last 168 hours  BNP (last 3 results) No results found for this basename: PROBNP,  in the last 8760 hours CBG: No results found for this basename: GLUCAP,  in the last 168 hours  Radiological Exams on Admission: Dg Chest 2 View  02/12/2013   CLINICAL DATA:  Fever.  EXAM: CHEST  2 VIEW  COMPARISON:  01/10/2013  FINDINGS: There is mild cardiomegaly, stable from prior. Dual-chamber left approach pacer, leads in unchanged position. Right porta catheter, tip in stable position. No acute infiltrate, edema, effusion, or pneumothorax.  IMPRESSION: No active cardiopulmonary disease.   Electronically Signed   By: Tiburcio Pea M.D.   On: 02/12/2013 00:47    EKG: Independently reviewed.  Assessment/Plan Principal Problem:   Fever and neutropenia Active Problems:   Diffuse large B cell lymphoma   Diabetes mellitus type 2 in obese   Non-Hodgkin's lymphoma of abdomen   1. Fever and low WBC - not full blown neutropenia yet but patient at high risk for developing this in the next couple days as she is only day 2 out from her last chemo treatment.  Blood cultures ordered, CXR and UA negative, patient empirically started on cefepime at this stage but strongly suspect given that she had URI symptoms prior to chemo that this is a viral URI.  Patient is certainly not septic nor toxic appearing at this time. 2. DLBCL - consult put into computer system for oncology 3. DM2 - continue home meds at this time, cbg checks AC/HS    Code Status: Full Code (must indicate code status--if unknown or must be presumed, indicate so) Family Communication: No family in room (indicate person spoken with, if applicable, with phone number if by telephone) Disposition Plan: Admit to inpatient (indicate anticipated LOS)  Time spent: 70 min  Jayleon Mcfarlane M. [facility] Pager  [phone]  If 7PM-7AM, please contact night-coverage [email or web address] Password [email or web address] 02/12/2013, 1:50 AM

## 2013-02-12 NOTE — Progress Notes (Signed)
[facility] PROGRESS NOTE  [name] ZOX:[id number] DOB: [birth year] DOA: 02/11/2013 PCP: Nadean Corwin, MD  Brief narrative: 72 year old female with past medical history of  Non-Hodgkin lymphoma, atrial fibrillation (on coumadin), status post pacemaker placement for sick sinus rhythm, diabetes who presented to [facility] ED 02/11/2013 for fever of 100.4 F. Patietn was instructed by an oncologist to go to ED for evaluation. Vitals were stable in ED with BP 96/58 which has improved to 114/67 with initial IV fluids. T max was 100.8 F and HR 55 - 94. Oxygen saturation was 96% on room air. CXR showed no active cardiopulmonary disease. CBC revealed WBC count of 2.4, hemoglobin of 10.3 and platelets of 30. BMP revealed sodium of 130 and creatinine of 1.31.  Assessment/Plan:  Principal Problem:   Fever and neutropenia - secondary to sequela of chemotherapy - WBC count 2.4 --> 1.5; pt does have 3.9 neutrophils so will defer Neupogen treatment for now unless WBC count drops further - continue cefepime started on admission - monitor CBC daily Active Problems:   Essential hypertension, benign - continue Cardizem 180 mg daily   Atrial fibrillation - on Cardizem and coumadin; coumadin on hold due to thrombocytopenia   Diffuse large B cell lymphoma - will inform Dr. Rosie Fate of patient's admission in am   Hypothyroidism - continue synthroid 50 mcg daily   Diabetes mellitus type 2  - check A1c - contineu metformin   Hyperlipidemia - continue statin therapy   Depression - continue celexa   Acute renal failure - likely pre-renal etiology, dehydration - continue IV fluids - follow up BMP In am   Anemia of chronic disease - secondary to history of malignancy and sequela of chemotherapy - hemoglobin 9.1 - no indications for transfusion    Code Status: full code Family Communication: no family at the bedside Disposition Plan: home when stable  Manson Passey, MD  [facility] Pager [phone]  If 7PM-7AM, please contact night-coverage [email or web address] Password [email or web address] 02/12/2013, 6:44 AM   LOS: 1 day   Consultants:  Will inform Dr. Rosie Fate in am that patient has been admitted to [facility]  Procedures:  None   Antibiotics:  Cefepime 02/12/2013  HPI/Subjective: No overnight events.  Objective: Filed Vitals:   02/11/13 2345 02/12/13 0147 02/12/13 0200 02/12/13 0633  BP: 100/41  107/60 96/58  Pulse: 94  90 92  Temp:   97.8 F (36.6 C) 98.4 F (36.9 C)  TempSrc:   Oral Oral  Resp: 18  20 22   Height:  4\' 8"  (1.422 m) 4\' 8"  (1.422 m)   Weight:  99.338 kg (219 lb) 97.6 kg (215 lb 2.7 oz)   SpO2: 97%  97% 96%    Intake/Output Summary (Last 24 hours) at 02/12/13 0644 Last data filed at 02/12/13 0449  Gross per 24 hour  Intake      0 ml  Output    600 ml  Net   -600 ml    Exam:   General:  Pt is alert, follows commands appropriately, not in acute distress  Cardiovascular: paced rhythm; right chest port-a-cath, S1/S2 appreciated   Respiratory: Clear to auscultation bilaterally, no wheezing, no crackles, no rhonchi  Abdomen: Soft, non tender, non distended, bowel sounds present, no guarding  Extremities: No edema, pulses DP and PT palpable bilaterally  Neuro: Grossly nonfocal  Data Reviewed: Basic Metabolic Panel:  Recent Labs Lab 02/09/13 0930 02/11/13 2333 02/12/13 0510  NA 138 130* 133*  K 4.7 4.7 4.4  CL  --  93* 99  CO2 29 27 25   GLUCOSE 98 151* 137*  BUN 37.1* 20 21  CREATININE 1.3* 1.31* 1.48*  CALCIUM 10.1 10.2 9.7   Liver Function Tests:  Recent Labs Lab 02/09/13 0930 02/11/13 2333  AST 19 11  ALT 45 19  ALKPHOS 52 49  BILITOT 0.69 0.5  PROT 7.0 6.3  ALBUMIN 3.8 3.2*    Recent Labs Lab 02/11/13 2333  LIPASE 38   No results found for this basename: AMMONIA,  in the last 168 hours CBC:  Recent Labs Lab 02/09/13 0930 02/11/13 2333 02/12/13 0510  WBC 4.7 2.4* 1.5*  NEUTROABS 3.9  --   --   HGB  12.6 10.3* 9.1*  HCT 37.3 29.6* 26.9*  MCV 95.7 94.6 95.1  PLT 134* 30* 23*   Cardiac Enzymes: No results found for this basename: CKTOTAL, CKMB, CKMBINDEX, TROPONINI,  in the last 168 hours BNP: No components found with this basename: POCBNP,  CBG: No results found for this basename: GLUCAP,  in the last 168 hours  No results found for this or any previous visit (from the past 240 hour(s)).   Studies: Dg Chest 2 View 02/12/2013    IMPRESSION: No active cardiopulmonary disease.   Electronically Signed   By: Tiburcio Pea M.D.   On: 02/12/2013 00:47    Scheduled Meds: . allopurinol  300 mg Oral Daily  . ceFEPime (MAXIPIME) IV  2 g Intravenous Q12H  . citalopram  20 mg Oral q morning - 10a  . dexamethasone  40 mg Oral Daily  . diltiazem  180 mg Oral q morning - 10a  . levothyroxine  50 mcg Oral QAC breakfast  . magnesium oxide  200 mg Oral Daily  . metFORMIN  1,000 mg Oral BID WC  . potassium chloride  20 mEq Oral Daily  . simvastatin  20 mg Oral q1800

## 2013-02-12 NOTE — Progress Notes (Signed)
Platelets of 30 noted on lab work: spoke with pharmacy, coumadin order stopped and patient put on SCDs.  Thrombocytopenia is likely BMS from chemotherapy, no bleeding complications at this time to indicate transfusion.

## 2013-02-13 LAB — BASIC METABOLIC PANEL
BUN: 30 mg/dL — ABNORMAL HIGH (ref 6–23)
Chloride: 94 mEq/L — ABNORMAL LOW (ref 96–112)
Creatinine, Ser: 1.2 mg/dL — ABNORMAL HIGH (ref 0.50–1.10)
GFR calc non Af Amer: 44 mL/min — ABNORMAL LOW (ref >90)
Glucose, Bld: 379 mg/dL — ABNORMAL HIGH (ref 70–99)
Potassium: 4.6 mEq/L (ref 3.5–5.1)

## 2013-02-13 LAB — CBC
Hemoglobin: 10.3 g/dL — ABNORMAL LOW (ref 12.0–15.0)
MCH: 33.1 pg (ref 26.0–34.0)
MCHC: 35.3 g/dL (ref 30.0–36.0)
Platelets: 15 10*3/uL — CL (ref 150–400)
RBC: 3.11 MIL/uL — ABNORMAL LOW (ref 3.87–5.11)
RDW: 16 % — ABNORMAL HIGH (ref 11.5–15.5)

## 2013-02-13 LAB — GLUCOSE, CAPILLARY
Glucose-Capillary: 145 mg/dL — ABNORMAL HIGH (ref 70–99)
Glucose-Capillary: 424 mg/dL — ABNORMAL HIGH (ref 70–99)
Glucose-Capillary: 128 mg/dL — ABNORMAL HIGH (ref 70–99)
Glucose-Capillary: 223 mg/dL — ABNORMAL HIGH (ref 70–99)

## 2013-02-13 LAB — URINE CULTURE

## 2013-02-13 LAB — HEMOGLOBIN A1C: Mean Plasma Glucose: 123 mg/dL — ABNORMAL HIGH (ref <117)

## 2013-02-13 MED ORDER — INSULIN ASPART 100 UNIT/ML ~~LOC~~ SOLN
10.0000 [IU] | Freq: Three times a day (TID) | SUBCUTANEOUS | Status: DC
  Administered 2013-02-13 – 2013-02-16 (×10): 10 [IU] via SUBCUTANEOUS

## 2013-02-13 MED ORDER — INSULIN ASPART 100 UNIT/ML ~~LOC~~ SOLN
10.0000 [IU] | Freq: Once | SUBCUTANEOUS | Status: AC
  Administered 2013-02-13: 14:00:00 10 [IU] via SUBCUTANEOUS

## 2013-02-13 MED ORDER — INSULIN ASPART 100 UNIT/ML ~~LOC~~ SOLN
0.0000 [IU] | Freq: Every day | SUBCUTANEOUS | Status: DC
  Administered 2013-02-13: 2 [IU] via SUBCUTANEOUS
  Administered 2013-02-14: 22:00:00 3 [IU] via SUBCUTANEOUS
  Administered 2013-02-16 – 2013-02-21 (×5): 2 [IU] via SUBCUTANEOUS

## 2013-02-13 MED ORDER — INSULIN ASPART 100 UNIT/ML ~~LOC~~ SOLN
0.0000 [IU] | Freq: Three times a day (TID) | SUBCUTANEOUS | Status: DC
  Administered 2013-02-13: 18:00:00 3 [IU] via SUBCUTANEOUS
  Administered 2013-02-14 (×2): 11 [IU] via SUBCUTANEOUS
  Administered 2013-02-14: 09:00:00 7 [IU] via SUBCUTANEOUS
  Administered 2013-02-15: 10:00:00 11 [IU] via SUBCUTANEOUS
  Administered 2013-02-15: 20 [IU] via SUBCUTANEOUS
  Administered 2013-02-15: 3 [IU] via SUBCUTANEOUS
  Administered 2013-02-16: 11:00:00 4 [IU] via SUBCUTANEOUS
  Administered 2013-02-16: 15 [IU] via SUBCUTANEOUS
  Administered 2013-02-16: 11 [IU] via SUBCUTANEOUS
  Administered 2013-02-17: 13:00:00 7 [IU] via SUBCUTANEOUS
  Administered 2013-02-17: 11 [IU] via SUBCUTANEOUS
  Administered 2013-02-17: 10:00:00 4 [IU] via SUBCUTANEOUS
  Administered 2013-02-18: 13:00:00 3 [IU] via SUBCUTANEOUS
  Administered 2013-02-18: 4 [IU] via SUBCUTANEOUS
  Administered 2013-02-18: 15 [IU] via SUBCUTANEOUS
  Administered 2013-02-19: 3 [IU] via SUBCUTANEOUS
  Administered 2013-02-20 – 2013-02-21 (×2): 4 [IU] via SUBCUTANEOUS
  Administered 2013-02-21: 3 [IU] via SUBCUTANEOUS
  Administered 2013-02-21: 4 [IU] via SUBCUTANEOUS
  Administered 2013-02-22: 7 [IU] via SUBCUTANEOUS
  Administered 2013-02-22: 4 [IU] via SUBCUTANEOUS

## 2013-02-13 MED ORDER — INSULIN ASPART 100 UNIT/ML ~~LOC~~ SOLN
0.0000 [IU] | Freq: Three times a day (TID) | SUBCUTANEOUS | Status: DC
  Administered 2013-02-13: 13:00:00 20 [IU] via SUBCUTANEOUS
  Administered 2013-02-13: 09:00:00 15 [IU] via SUBCUTANEOUS

## 2013-02-13 MED ORDER — INSULIN ASPART 100 UNIT/ML ~~LOC~~ SOLN: 0.0000 [IU] | Freq: Every day | SUBCUTANEOUS | Status: DC

## 2013-02-13 MED ORDER — FILGRASTIM 480 MCG/1.6ML IJ SOLN
480.0000 ug | Freq: Every day | INTRAMUSCULAR | Status: DC
  Administered 2013-02-13 – 2013-02-16 (×4): 480 ug via SUBCUTANEOUS
  Filled 2013-02-13 (×6): qty 1.6

## 2013-02-13 MED ORDER — INSULIN ASPART 100 UNIT/ML ~~LOC~~ SOLN: 0.0000 [IU] | Freq: Three times a day (TID) | SUBCUTANEOUS | Status: DC

## 2013-02-13 NOTE — Progress Notes (Signed)
Patient CBG for lunch 424, notified Dr Elisabeth Pigeon, started patient on meal coverage insulin. Patient denies any distress. Will continue to assess patient.

## 2013-02-13 NOTE — Progress Notes (Signed)
CRITICAL VALUE ALERT  Critical value received:  WBC-0.5 and Platelets- 15   Date of notification:  02/13/2013  Time of notification:  0935  Critical value read back:yes  Nurse who received alert:  Jiles Crocker  MD notified (1st page):  Dr. Elisabeth Pigeon  Time of first page:  0940  MD notified (2nd page):  Time of second page:  Responding MD:  Dr. Elisabeth Pigeon  Time MD responded:  [phone]

## 2013-02-13 NOTE — Progress Notes (Signed)
Inpatient Diabetes Program Recommendations  AACE/ADA: New Consensus Statement on Inpatient Glycemic Control (2013)  Target Ranges:  Prepandial:   less than 140 mg/dL      Peak postprandial:   less than 180 mg/dL (1-2 hours)      Critically ill patients:  140 - 180 mg/dL   Reason for Visit: Hyperglycemia  72 y.o. female who presents to the ED with fever. Fever is occuring in the setting of chemotherapy just 2 days ago for DLBCL.  Hx Type 2 DM on metformin 1000 mg bid at home. Steroids - Decadron 40 mg QD  Results for MCKAY, BRANDT (MRN [id number]) as of 02/13/2013 16:31  Ref. Range 02/12/2013 11:44 02/12/2013 17:10 02/13/2013 07:52 02/13/2013 11:36 02/13/2013 13:32  Glucose-Capillary Latest Range: 70-99 mg/dL 782 (H) 956 (H) 213 (H) 424 (H) 290 (H)   Results for NANAKO, STOPHER (MRN [id number]) as of 02/13/2013 16:31  Ref. Range 02/12/2013 05:10  Hemoglobin A1C Latest Range: <5.7 % 5.9 (H)             Doubtful HgbA1C is accurate with low H/H. Hyperglycemia in the setting with steroids. Agree with Novolog 10 units tid and Novolog resistant tidwc and hs. May need low dose basal insulin if FBS remains elevated. - Lantus 10 units QD  Will continue to follow. Thank you. Ailene Ards, RD, LDN, CDE Inpatient Diabetes Coordinator [phone]

## 2013-02-13 NOTE — Progress Notes (Signed)
[facility] PROGRESS NOTE  [name] ZHY:[id number] DOB: Sep 09, 1940 DOA: 02/11/2013 PCP: Nadean Corwin, MD  Brief narrative: 72 year old female with past medical history of Non-Hodgkin lymphoma, atrial fibrillation (on coumadin), status post pacemaker placement for sick sinus rhythm, diabetes who presented to [facility] ED 02/11/2013 for fever of 100.4 F. Patietn was instructed by an oncologist to go to ED for evaluation.   Vitals were stable in ED with BP 96/58 which has improved to 114/67 with initial IV fluids. T max was 100.8 F and HR 55 - 94. Oxygen saturation was 96% on room air. CXR showed no active cardiopulmonary disease. CBC revealed WBC count of 2.4, hemoglobin of 10.3 and platelets of 30. BMP revealed sodium of 130 and creatinine of 1.31.   Assessment/Plan:  Principal Problem:  Fever and neutropenia  - secondary to sequela of chemotherapy  - WBC count 2.4 --> 1.5; no Neupogen given since admission - continue cefepime day #2 - monitor CBC with diff daily  Active Problems:  Pancytopenia  - secondary to history of malignancy and sequela of chemotherapy  - counts in all three cell lines continue to trend down but CBC pending this AM - will continue Maxipime for febrile neutropenia as noted above and follow up on Plt and Hg this AM to decide if transfusion is indicated  Essential hypertension, benign  - continue Cardizem 180 mg daily  - reasonable inpatient control  Atrial fibrillation  - on Cardizem and coumadin; coumadin on hold due to thrombocytopenia  Diffuse large B cell lymphoma  - will inform Dr. Rosie Fate of patient's admission Hypothyroidism  - continue synthroid 50 mcg daily  Diabetes mellitus type 2  - A1c 5.9 - will hold metformin due too acute renal failure - place on SSI for now until renal function improves  Hyperlipidemia  - continue statin therapy  Depression  - continue celexa  Acute renal failure  - likely pre-renal etiology, dehydration  -  continue IV fluids  - follow up on BMP this AM   Code Status: full code  Family Communication: no family at the bedside  Disposition Plan: remains inpatient   Manson Passey, MD  [facility]  Pager [phone]   Consultants:  Will inform Dr. Rosie Fate in am that patient has been admitted to [facility] Procedures:  None  Antibiotics:  Cefepime 02/12/2013  If 7PM-7AM, please contact night-coverage [email or web address] Password [facility] 02/13/2013, 8:37 AM   LOS: 2 days   HPI/Subjective: Pt denies chest pain or shortness of breath this AM.  Objective: Filed Vitals:   02/12/13 1323 02/12/13 2053 02/12/13 2217 02/13/13 0519  BP: 98/61  103/64 131/66  Pulse: 81  74 82  Temp: 98.1 F (36.7 C) 97.7 F (36.5 C) 97.7 F (36.5 C) 97.6 F (36.4 C)  TempSrc: Oral  Oral Oral  Resp: 20  16 20   Height:      Weight:      SpO2: 97%  98% 99%    Intake/Output Summary (Last 24 hours) at 02/13/13 0837 Last data filed at 02/13/13 0520  Gross per 24 hour  Intake    170 ml  Output   1800 ml  Net  -1630 ml    Exam:   General:  Pt is alert, follows commands appropriately, not in acute distress  Cardiovascular: Regular rate and rhythm, S1/S2, no murmurs, no rubs, no gallops  Respiratory: Clear to auscultation bilaterally, no wheezing, no crackles, no rhonchi  Abdomen: Soft, non tender, non distended, bowel sounds present,  no guarding  Extremities: No edema, pulses DP and PT palpable bilaterally  Neuro: Grossly nonfocal  Data Reviewed: Basic Metabolic Panel:  Recent Labs Lab 02/09/13 0930 02/11/13 2333 02/12/13 0510  NA 138 130* 133*  K 4.7 4.7 4.4  CL  --  93* 99  CO2 29 27 25   GLUCOSE 98 151* 137*  BUN 37.1* 20 21  CREATININE 1.3* 1.31* 1.48*  CALCIUM 10.1 10.2 9.7   Liver Function Tests:  Recent Labs Lab 02/09/13 0930 02/11/13 2333  AST 19 11  ALT 45 19  ALKPHOS 52 49  BILITOT 0.69 0.5  PROT 7.0 6.3  ALBUMIN 3.8 3.2*    Recent Labs Lab 02/11/13 2333  LIPASE 38    CBC:  Recent Labs Lab 02/09/13 0930 02/11/13 2333 02/12/13 0510  WBC 4.7 2.4* 1.5*  NEUTROABS 3.9  --   --   HGB 12.6 10.3* 9.1*  HCT 37.3 29.6* 26.9*  MCV 95.7 94.6 95.1  PLT 134* 30* 23*   CBG:  Recent Labs Lab 02/12/13 1144 02/12/13 1710 02/13/13 0752  GLUCAP 160* 274* 322*    Recent Results (from the past 240 hour(s))  CULTURE, BLOOD (ROUTINE X 2)     Status: None   Collection Time    02/11/13 11:33 PM      Result Value Range Status   Specimen Description BLOOD LEFT ANTECUBITAL   Final   Special Requests BOTTLES DRAWN AEROBIC AND ANAEROBIC 5CC   Final   Culture  Setup Time     Final   Value: 02/12/2013 13:51     Performed at [facility]   Culture     Final   Value:        BLOOD CULTURE RECEIVED NO GROWTH TO DATE CULTURE WILL BE HELD FOR 5 DAYS BEFORE ISSUING A FINAL NEGATIVE REPORT     Performed at [facility]   Report Status PENDING   Incomplete  CULTURE, BLOOD (ROUTINE X 2)     Status: None   Collection Time    02/11/13 11:49 PM      Result Value Range Status   Specimen Description BLOOD RIGHT ANTECUBITAL   Final   Special Requests BOTTLES DRAWN AEROBIC AND ANAEROBIC 10CC   Final   Culture  Setup Time     Final   Value: 02/12/2013 13:51     Performed at [facility]   Culture     Final   Value:        BLOOD CULTURE RECEIVED NO GROWTH TO DATE CULTURE WILL BE HELD FOR 5 DAYS BEFORE ISSUING A FINAL NEGATIVE REPORT     Performed at [facility]   Report Status PENDING   Incomplete     Studies: Dg Chest 2 View   02/12/2013  No active cardiopulmonary disease.     Scheduled Meds: . acetaminophen  650 mg Oral Once  . allopurinol  300 mg Oral Daily  . ceFEPime  IV  2 g Intravenous Q12H  . citalopram  20 mg Oral q morning - 10a  . dexamethasone  40 mg Oral Daily  . diltiazem  180 mg Oral q morning - 10a  . diphenhydrAMINE  25 mg Oral Once  . insulin aspart  0-20 Units Subcutaneous TID WC  . insulin aspart  0-5 Units  Subcutaneous QHS  . levothyroxine  50 mcg Oral QAC breakfast  . magnesium oxide  200 mg Oral Daily  . metFORMIN  1,000 mg Oral BID WC  .  potassium chloride  20 mEq Oral Daily  . pravastatin  40 mg Oral QHS   Continuous Infusions:

## 2013-02-13 NOTE — Progress Notes (Addendum)
Patient CBG trending up, not on insulin, notified Dr. Elisabeth Pigeon who started patient on SSI. Will continue to monitor patient.

## 2013-02-14 DIAGNOSIS — D61810 Antineoplastic chemotherapy induced pancytopenia: Principal | ICD-10-CM

## 2013-02-14 LAB — GLUCOSE, CAPILLARY
Glucose-Capillary: 285 mg/dL — ABNORMAL HIGH (ref 70–99)
Glucose-Capillary: 275 mg/dL — ABNORMAL HIGH (ref 70–99)
Glucose-Capillary: 295 mg/dL — ABNORMAL HIGH (ref 70–99)

## 2013-02-14 LAB — CBC WITH DIFFERENTIAL/PLATELET
Basophils Absolute: 0 10*3/uL (ref 0.0–0.1)
Basophils Relative: 0 % (ref 0–1)
Eosinophils Relative: 0 % (ref 0–5)
HCT: 26 % — ABNORMAL LOW (ref 36.0–46.0)
Lymphocytes Relative: 85 % — ABNORMAL HIGH (ref 12–46)
Lymphs Abs: 0.3 10*3/uL — ABNORMAL LOW (ref 0.7–4.0)
MCV: 92.9 fL (ref 78.0–100.0)
Monocytes Relative: 9 % (ref 3–12)
Neutro Abs: 0 10*3/uL — ABNORMAL LOW (ref 1.7–7.7)
RBC: 2.8 MIL/uL — ABNORMAL LOW (ref 3.87–5.11)
WBC: 0.3 10*3/uL — CL (ref 4.0–10.5)

## 2013-02-14 LAB — BASIC METABOLIC PANEL
CO2: 25 mEq/L (ref 19–32)
Chloride: 100 mEq/L (ref 96–112)
Glucose, Bld: 237 mg/dL — ABNORMAL HIGH (ref 70–99)
Sodium: 134 mEq/L — ABNORMAL LOW (ref 135–145)

## 2013-02-14 MED ORDER — METFORMIN HCL ER 500 MG PO TB24
1000.0000 mg | ORAL_TABLET | Freq: Two times a day (BID) | ORAL | Status: DC
  Administered 2013-02-14: 09:00:00 1000 mg via ORAL
  Filled 2013-02-14 (×3): qty 2

## 2013-02-14 MED ORDER — DOCUSATE SODIUM 100 MG PO CAPS
100.0000 mg | ORAL_CAPSULE | Freq: Two times a day (BID) | ORAL | Status: DC
  Administered 2013-02-14 – 2013-02-22 (×16): 100 mg via ORAL
  Filled 2013-02-14 (×18): qty 1

## 2013-02-14 MED ORDER — INSULIN GLARGINE 100 UNIT/ML ~~LOC~~ SOLN
10.0000 [IU] | Freq: Every day | SUBCUTANEOUS | Status: DC
  Administered 2013-02-14: 10 [IU] via SUBCUTANEOUS
  Filled 2013-02-14 (×2): qty 0.1

## 2013-02-14 NOTE — Progress Notes (Signed)
CRITICAL VALUE ALERT  Critical value received:  Platelets 11  Date of notification:  02/14/2013   Time of notification:  0601 AM  Critical value read back:yes  Nurse who received alert:  Silvestre Gunner RN  MD notified (1st page):  Craige Cotta, NP  Time of first page:  0604 AM  MD notified (2nd page): Craige Cotta, NP  Time of second page: 0617 AM  Responding MD:  N/A  Time MD responded:  N/A

## 2013-02-14 NOTE — Progress Notes (Addendum)
[facility] PROGRESS NOTE  [name] JYN:[id number] DOB: Sep 13, 1940 DOA: 02/11/2013 PCP: Nadean Corwin, MD  Brief narrative: 72 year old female with past medical history of Non-Hodgkin lymphoma, atrial fibrillation (on coumadin), status post pacemaker placement for sick sinus rhythm, diabetes who presented to [facility] ED 02/11/2013 for fever of 100.4 F. Patietn was instructed by an oncologist to go to ED for evaluation.   Vitals were stable in ED with BP 96/58 which has improved to 114/67 with initial IV fluids. T max was 100.8 F and HR 55 - 94. Oxygen saturation was 96% on room air. CXR showed no active cardiopulmonary disease. CBC revealed WBC count of 2.4, hemoglobin of 10.3 and platelets of 30. BMP revealed sodium of 130 and creatinine of 1.31.   Assessment/Plan:  Principal Problem:  Fever and neutropenia  - secondary to sequela of chemotherapy  - WBC count 2.4 --> 1.5 --> 0.3; Neupogen started 02/13/2013 - continue cefepime day #3 - pt remains afebrile over 24 hours  Active Problems:  Pancytopenia  - secondary to history of malignancy and sequela of chemotherapy  - counts in all three cell lines continue to trend down - will transfuse one unit of platelets this AM 11/04 - neupogen started 11/03 as noted above   Essential hypertension, benign  - continue Cardizem 180 mg daily  - reasonable inpatient control  Atrial fibrillation  - on Cardizem  - will hold Coumadin due to thrombocytopenia and anemia - SCD's for DVT prophylaxis  Diffuse large B cell lymphoma  - Dr. Rosie Fate informed of patient's admission  Hypothyroidism  - continue synthroid 50 mcg daily  Diabetes mellitus type 2  - A1c 5.9  - will continue to hold metformin due too acute renal failure, please do not restart until renal function improves   - continue SSI for now, meal coverage with Novolog 10 units TID - will also add Lantus 10 units QD for better CBG control  Hyperlipidemia  - continue statin  therapy  Depression  - continue celexa  Acute renal failure  - likely pre-renal etiology, dehydration  - continue IV fluids as creatinine is trending down  - follow up on BMP this AM   Code Status: full code  Family Communication: no family at the bedside  Disposition Plan: remains inpatient   Manson Passey, MD  [facility]  Pager [phone]   Consultants:  Informed Dr. Rosie Fate that patient has been admitted to [facility] Procedures:  None  Antibiotics:  Cefepime 02/12/2013   If 7PM-7AM, please contact night-coverage [email or web address] Password [email or web address] 02/14/2013, 6:23 AM   LOS: 3 days   HPI/Subjective: No events overnight. Pt denies chest pain or shortness of breath.   Objective: Filed Vitals:   02/13/13 1120 02/13/13 1427 02/13/13 2004 02/14/13 0451  BP: 104/50 112/63 105/56 145/83  Pulse: 82 79 74 82  Temp:  97.8 F (36.6 C) 98.1 F (36.7 C) 97.9 F (36.6 C)  TempSrc:  Oral Oral Oral  Resp:  20 18 18   Height:      Weight:    97.5 kg (214 lb 15.2 oz)  SpO2:  99% 98% 100%    Intake/Output Summary (Last 24 hours) at 02/14/13 9629 Last data filed at 02/14/13 0453  Gross per 24 hour  Intake    980 ml  Output   1550 ml  Net   -570 ml    Exam:   General:  Pt is alert, follows commands appropriately, not in acute distress  Cardiovascular: Irregular rate  and rhythm, no murmurs, no rubs, no gallops  Respiratory: Clear to auscultation bilaterally, no wheezing, no crackles, no rhonchi  Abdomen: Soft, non tender, non distended, bowel sounds present, no guarding  Extremities: No edema, pulses DP and PT palpable bilaterally  Data Reviewed: Basic Metabolic Panel:  Recent Labs Lab 02/09/13 0930 02/11/13 2333 02/12/13 0510 02/13/13 0920 02/14/13 0509  NA 138 130* 133* 130* 134*  K 4.7 4.7 4.4 4.6 4.4  CL  --  93* 99 94* 100  CO2 29 27 25 24 25   GLUCOSE 98 151* 137* 379* 237*  BUN 37.1* 20 21 30* 33*  CREATININE 1.3* 1.31* 1.48* 1.20* 1.18*  CALCIUM 10.1 10.2  9.7 9.7 9.7   Liver Function Tests:  Recent Labs Lab 02/09/13 0930 02/11/13 2333  AST 19 11  ALT 45 19  ALKPHOS 52 49  BILITOT 0.69 0.5  PROT 7.0 6.3  ALBUMIN 3.8 3.2*    Recent Labs Lab 02/11/13 2333  LIPASE 38   CBC:  Recent Labs Lab 02/09/13 0930 02/11/13 2333 02/12/13 0510 02/13/13 0920 02/14/13 0509  WBC 4.7 2.4* 1.5* 0.5* 0.3*  NEUTROABS 3.9  --   --   --  0.0*  HGB 12.6 10.3* 9.1* 10.3* 9.3*  HCT 37.3 29.6* 26.9* 29.2* 26.0*  MCV 95.7 94.6 95.1 93.9 92.9  PLT 134* 30* 23* 15* 11*   CBG:  Recent Labs Lab 02/13/13 0752 02/13/13 1136 02/13/13 1332 02/13/13 1638 02/13/13 2107  GLUCAP 322* 424* 290* 128* 223*    Recent Results (from the past 240 hour(s))  CULTURE, BLOOD (ROUTINE X 2)     Status: None   Collection Time    02/11/13 11:33 PM      Result Value Range Status   Specimen Description BLOOD LEFT ANTECUBITAL   Final   Special Requests BOTTLES DRAWN AEROBIC AND ANAEROBIC 5CC   Final   Culture  Setup Time     Final   Value: 02/12/2013 13:51     Performed at [facility]   Culture     Final   Value:        BLOOD CULTURE RECEIVED NO GROWTH TO DATE CULTURE WILL BE HELD FOR 5 DAYS BEFORE ISSUING A FINAL NEGATIVE REPORT     Performed at [facility]   Report Status PENDING   Incomplete  CULTURE, BLOOD (ROUTINE X 2)     Status: None   Collection Time    02/11/13 11:49 PM      Result Value Range Status   Specimen Description BLOOD RIGHT ANTECUBITAL   Final   Special Requests BOTTLES DRAWN AEROBIC AND ANAEROBIC 10CC   Final   Culture  Setup Time     Final   Value: 02/12/2013 13:51     Performed at [facility]   Culture     Final   Value:        BLOOD CULTURE RECEIVED NO GROWTH TO DATE CULTURE WILL BE HELD FOR 5 DAYS BEFORE ISSUING A FINAL NEGATIVE REPORT     Performed at [facility]   Report Status PENDING   Incomplete  URINE CULTURE     Status: None   Collection Time    02/12/13 12:11 AM      Result Value  Range Status   Specimen Description URINE, RANDOM   Final   Special Requests Normal   Final   Culture  Setup Time     Final   Value: 02/12/2013 18:14  Performed at [facility] Count     Final   Value: NO GROWTH     Performed at [facility]   Culture     Final   Value: NO GROWTH     Performed at [facility]   Report Status 02/13/2013 FINAL   Final     Studies: No results found.  Scheduled Meds: . acetaminophen  650 mg Oral Once  . allopurinol  300 mg Oral Daily  . ceFEPime  IV  2 g Intravenous Q12H  . citalopram  20 mg Oral q morning - 10a  . dexamethasone  40 mg Oral Daily  . diltiazem  180 mg Oral q morning - 10a  . diphenhydrAMINE  25 mg Oral Once  . filgrastim  SQ  480 mcg Subcutaneous q1800  . insulin aspart  0-20 Units Subcutaneous TID WC  . insulin aspart  0-5 Units Subcutaneous QHS  . insulin aspart  10 Units Subcutaneous TID WC  . levothyroxine  50 mcg Oral QAC breakfast  . magnesium oxide  200 mg Oral Daily  . potassium chloride  20 mEq Oral Daily  . pravastatin  40 mg Oral QHS   Continuous Infusions:

## 2013-02-14 NOTE — Consult Note (Signed)
[name]   DOB:10/16/1940   EA#:[id number]   YNW#:[id number]  Subjective:  Patient reports cough this morning but with overall improvement.  She reports one minor episode of epistaxis. She denies subjective fevers or chills .  She reports receiving one unit of plts today.   Objective:  Filed Vitals:   02/14/13 1128  BP: 107/47  Pulse: 73  Temp: 97.5 F (36.4 C)  Resp: 16    Body mass index is 48.22 kg/(m^2).  Intake/Output Summary (Last 24 hours) at 02/14/13 1310 Last data filed at 02/14/13 1042  Gross per 24 hour  Intake 1002.5 ml  Output   1350 ml  Net -347.5 ml    General: Obese, non-toxic appearing; R sided Port-a-cath  Sclerae unicteric  Oropharynx clear  Lungs clear -- no rales or rhonchi in the anterior fields  Heart regular rate and rhythm  Abdomen benign  MSK no focal spinal tenderness, no peripheral edema  Neuro nonfocal  CBG (last 3)   Recent Labs  02/13/13 2107 02/14/13 0745 02/14/13 1233  GLUCAP 223* 223* 285*     Labs:  Lab Results  Component Value Date   WBC 0.3* 02/14/2013   HGB 9.3* 02/14/2013   HCT 26.0* 02/14/2013   MCV 92.9 02/14/2013   PLT 11* 02/14/2013   NEUTROABS 0.0* 02/14/2013   Basic Metabolic Panel:  Recent Labs Lab 02/09/13 0930  02/11/13 2333 02/12/13 0510 02/13/13 0920 02/14/13 0509  NA 138  --  130* 133* 130* 134*  K 4.7  < > 4.7 4.4 4.6 4.4  CL  --   --  93* 99 94* 100  CO2 29  --  27 25 24 25   GLUCOSE 98  --  151* 137* 379* 237*  BUN 37.1*  --  20 21 30* 33*  CREATININE 1.3*  --  1.31* 1.48* 1.20* 1.18*  CALCIUM 10.1  --  10.2 9.7 9.7 9.7  < > = values in this interval not displayed. GFR Estimated Creatinine Clearance: 41.4 ml/min (by C-G formula based on Cr of 1.18). Liver Function Tests:  Recent Labs Lab 02/09/13 0930 02/11/13 2333  AST 19 11  ALT 45 19  ALKPHOS 52 49  BILITOT 0.69 0.5  PROT 7.0 6.3  ALBUMIN 3.8 3.2*    Recent Labs Lab 02/11/13 2333  LIPASE 38   Coagulation profile  Recent  Labs Lab 02/12/13 0510  INR 1.53*    CBC:  Recent Labs Lab 02/09/13 0930 02/11/13 2333 02/12/13 0510 02/13/13 0920 02/14/13 0509  WBC 4.7 2.4* 1.5* 0.5* 0.3*  NEUTROABS 3.9  --   --   --  0.0*  HGB 12.6 10.3* 9.1* 10.3* 9.3*  HCT 37.3 29.6* 26.9* 29.2* 26.0*  MCV 95.7 94.6 95.1 93.9 92.9  PLT 134* 30* 23* 15* 11*   CBG:  Recent Labs Lab 02/13/13 1332 02/13/13 1638 02/13/13 2107 02/14/13 0745 02/14/13 1233  GLUCAP 290* 128* 223* 223* 285*   Hgb A1c  Recent Labs  02/12/13 0510  HGBA1C 5.9*   Microbiology Recent Results (from the past 240 hour(s))  CULTURE, BLOOD (ROUTINE X 2)     Status: None   Collection Time    02/11/13 11:33 PM      Result Value Range Status   Specimen Description BLOOD LEFT ANTECUBITAL   Final   Special Requests BOTTLES DRAWN AEROBIC AND ANAEROBIC 5CC   Final   Culture  Setup Time     Final   Value: 02/12/2013 13:51  Performed at [facility]     Final   Value:        BLOOD CULTURE RECEIVED NO GROWTH TO DATE CULTURE WILL BE HELD FOR 5 DAYS BEFORE ISSUING A FINAL NEGATIVE REPORT     Performed at [facility]   Report Status PENDING   Incomplete  CULTURE, BLOOD (ROUTINE X 2)     Status: None   Collection Time    02/11/13 11:49 PM      Result Value Range Status   Specimen Description BLOOD RIGHT ANTECUBITAL   Final   Special Requests BOTTLES DRAWN AEROBIC AND ANAEROBIC 10CC   Final   Culture  Setup Time     Final   Value: 02/12/2013 13:51     Performed at [facility]   Culture     Final   Value:        BLOOD CULTURE RECEIVED NO GROWTH TO DATE CULTURE WILL BE HELD FOR 5 DAYS BEFORE ISSUING A FINAL NEGATIVE REPORT     Performed at [facility]   Report Status PENDING   Incomplete  URINE CULTURE     Status: None   Collection Time    02/12/13 12:11 AM      Result Value Range Status   Specimen Description URINE, RANDOM   Final   Special Requests Normal   Final   Culture  Setup Time      Final   Value: 02/12/2013 18:14     Performed at [facility] Count     Final   Value: NO GROWTH     Performed at [facility]   Culture     Final   Value: NO GROWTH     Performed at [facility]   Report Status 02/13/2013 FINAL   Final    Studies:  EXAM:  CHEST 2 VIEW 02/12/2013 COMPARISON: 01/10/2013  FINDINGS:  There is mild cardiomegaly, stable from prior. Dual-chamber left approach pacer, leads in unchanged position. Right porta catheter, tip in stable position. No acute infiltrate, edema, effusion, or pneumothorax.   IMPRESSION:  No active cardiopulmonary disease.  Assessment: 72 y.o. female with a history of Lymphoma, non-Hodgkin's, R mesentery started salvage chemotherapy with R-GPD on 10/26/12 on q 3week cycles. Her last dose was on 02/01/2013 following 4 cycles. She received neupogen on day# 9 (02/09/2013). She was admitted on 02/12/2013 secondary  Plan:  1. Febrile neutropenia. --Blood cultures NGTD,  CXR and UA negative, patient empirically on cefepime. --Patient given neulasta as noted above on 02/09/2013.  Can discuss with pharmacy the utility of providing additional neupogen given neulasta is long-acting. H/D stable.  --Await count recovery.  If blood cultures are negative, ANC adequate can discharge on antibiotics to complete a 10-day course.   --If remains febrile, will add anti-fungals.   2. 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 --Prior chemotherapy dose adjusted with 50% of cisplatin given due to reduced creatinine clearance, gemcitabine reduced by 25% and Rituximab at full dose.  She was also given dexamethasone 40 mg daily on days #2-4 (10/23 -10/24) which likely contributed to her hyperglycemia.    3. Pancytopenia as noted #1 secondary to chemotherapy.   She should be approaching her expected nadir. --Supportive packed RBC and platelets transfusions prn.   Neulasta as noted above.   4. History of atrial  fibrillation: Diltiazem, pacemaker. Coumadin being held secondary to #3.   5. Hypothyroidism: Levothyroxine per  primary team  6. Hypertension: Diltiazem per primary team  7. Diabetes mellitus, type II: SSI per primary team.   8. Creatinine clearance.  Avoiding nephrotoxins. Adjusting medications based on CC.   9. Disposition: Full Code.  PT/OT as tolerated. I spoke with her daughter Elyse Hsu by phone to update her on her medical state.   LOS:3 days   We will follow this patient with you.    Rebekka Lobello, MD 02/14/2013  1:10 PM

## 2013-02-14 NOTE — Care Management Note (Addendum)
    Page 1 of 2   02/20/2013     2:40:25 PM   CARE MANAGEMENT NOTE 02/20/2013  Patient:  [name], [name]   Account Number:  [id number]  Date Initiated:  02/12/2013  Documentation initiated by:  Lanier Clam  Subjective/Objective Assessment:   72 Y/O F ADMITTED W/FEVER.     Action/Plan:   FROM HOME.   Anticipated DC Date:  02/20/2013   Anticipated DC Plan:  HOME W HOME HEALTH SERVICES      DC Planning Services  CM consult      Choice offered to / List presented to:  C-1 Patient        HH arranged  HH-1 RN  HH-2 PT  HH-3 OT  HH-4 NURSE'S AIDE  HH-6 SOCIAL WORKER      HH agency  [facility]   Status of service:  In process, will continue to follow Medicare Important Message given?   (If response is "NO", the following Medicare IM given date fields will be blank) Date Medicare IM given:   Date Additional Medicare IM given:    Discharge Disposition:  HOME W HOME HEALTH SERVICES  Per UR Regulation:  Reviewed for med. necessity/level of care/duration of stay  If discussed at Long Length of Stay Meetings, dates discussed:    Comments:  02/20/13 [name] RN,BSN NCM [phone] [facility] FOLLOWING FOR D/C,& HHC ORDERS.PATIENT STATES SHE WILL SATY [facility] HIS GIRLFRIEND WHO LIVE ON [address] IN [place].PATIENT FEELS SAFE STAYING [facility].  02/16/13 [name] RN,BSN NCM [phone] SPOKE TO PATIENT ABOUT D/C PLANS.SHE IS UNABLE TO RETURN HOME D/T CARPET/FLOOR  PULLED UP FROM ANIMALS URINE,ETC-NOT CONDUCIVE FOR HER YET,SHE WILL HAVE LINOLEUM TO BE PLACED ON FLOOR,THEN SHE WILL RETURN HOME.SHE PLANS TO STAY [facility] BRIAN WHO LIVES ON [address].PT-HH.PATIENT ASKED THAT I CALL SON'S HOME.SPOKE TO KIM(SON'S GIRLFRIEND) WHO SAYS SHE WILL BE RETURNING TO WORK NEXT THURSDAY,& NO ONE WILL BE @ HOME TO TAKE CARE OF PATIENT.I EXPLAINED THAT SINCE PATIENT WILL HAVE HHC,& SHE CAN PAY FOR ADDITIONAL PRIVATE HELP IF NEEDED,SHE WILL BE SAFE TO STAY THERE IF THAT IS THE  PLAN.PATIENT STATED THAT HER SON USE TO TAKE DRUGS BUT THAT IS THE PAST SHE DOES NOT FEEL UNSAFE AROUND SON.PATIENT ASKED THAT I CALL THE [facility]CP[facility] DOROTHY BODKIN([facility]) TEL [phone]-SHE FEELS PATIENT NEEDS TO GO TO A NSG HOME,I EXPLAINED THAT SHE IS NOT RECOMMENDED FOR A SNF.SHE VOICED UNDERSTANDING.[facility] ALREADY FOLLOWING.MD UPDATED.  02/15/13 [name] RN,BSN NCM [phone] [facility] CHOSEN FOR HHC TC [facility] REP -ORDERS RECEIVED,AWAITING D/C.PATIENT NOT SURE OF ADDRESS WHERE SHE WILL STAY BUT IT WILL BE [facility].  02/14/13 [name] RN,BSN NCM [phone] ORDERED FOR HHRN/PT/OT/AIDE.WILL OFFER HHC AGENCY LIST.

## 2013-02-15 DIAGNOSIS — I10 Essential (primary) hypertension: Secondary | ICD-10-CM

## 2013-02-15 DIAGNOSIS — Z950 Presence of cardiac pacemaker: Secondary | ICD-10-CM

## 2013-02-15 DIAGNOSIS — E039 Hypothyroidism, unspecified: Secondary | ICD-10-CM

## 2013-02-15 LAB — CBC WITH DIFFERENTIAL/PLATELET
Eosinophils Absolute: 0 10*3/uL (ref 0.0–0.7)
Eosinophils Relative: 0 % (ref 0–5)
Hemoglobin: 8.5 g/dL — ABNORMAL LOW (ref 12.0–15.0)
Lymphs Abs: 0.3 10*3/uL — ABNORMAL LOW (ref 0.7–4.0)
MCH: 32.8 pg (ref 26.0–34.0)
MCHC: 35.7 g/dL (ref 30.0–36.0)
MCV: 91.9 fL (ref 78.0–100.0)
Monocytes Absolute: 0 10*3/uL — ABNORMAL LOW (ref 0.1–1.0)
Neutrophils Relative %: 3 % — ABNORMAL LOW (ref 43–77)
Platelets: 27 10*3/uL — CL (ref 150–400)
RDW: 15.5 % (ref 11.5–15.5)

## 2013-02-15 LAB — BASIC METABOLIC PANEL
BUN: 33 mg/dL — ABNORMAL HIGH (ref 6–23)
CO2: 25 mEq/L (ref 19–32)
Calcium: 10 mg/dL (ref 8.4–10.5)
Creatinine, Ser: 1.09 mg/dL (ref 0.50–1.10)
GFR calc Af Amer: 57 mL/min — ABNORMAL LOW (ref >90)
GFR calc non Af Amer: 49 mL/min — ABNORMAL LOW (ref >90)
Glucose, Bld: 268 mg/dL — ABNORMAL HIGH (ref 70–99)
Potassium: 4.5 mEq/L (ref 3.5–5.1)

## 2013-02-15 LAB — GLUCOSE, CAPILLARY
Glucose-Capillary: 410 mg/dL — ABNORMAL HIGH (ref 70–99)
Glucose-Capillary: 174 mg/dL — ABNORMAL HIGH (ref 70–99)

## 2013-02-15 LAB — PREPARE PLATELET PHERESIS

## 2013-02-15 MED ORDER — INSULIN GLARGINE 100 UNIT/ML ~~LOC~~ SOLN
20.0000 [IU] | Freq: Every day | SUBCUTANEOUS | Status: DC
  Administered 2013-02-15: 22:00:00 20 [IU] via SUBCUTANEOUS
  Filled 2013-02-15 (×2): qty 0.2

## 2013-02-15 NOTE — Evaluation (Signed)
I have reviewed this note and agree with all findings. Kati Sheddrick Lattanzio, PT, DPT Pager: [phone]   

## 2013-02-15 NOTE — Progress Notes (Signed)
[facility] PROGRESS NOTE  [name] QIO:[id number] DOB: September 24, 1940 DOA: 02/11/2013 PCP: Nadean Corwin, MD  Assessment/Plan: Febrile neutropenia.  --Blood cultures NGTD, CXR and UA negative, patient empirically on cefepime.  --Patient given neulasta as noted above on 02/09/2013. Can discuss with pharmacy the utility of providing additional neupogen given neulasta is long-acting. H/D stable.  -Await WBC count recovery. If blood cultures are negative, ANC currently inadequate continue antibiotic coverage (complete ten-day course. ) -We'll await recommendations from oncology  2. 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  --Prior chemotherapy dose adjusted with 50% of cisplatin given due to reduced creatinine clearance, gemcitabine reduced by 25% and Rituximab at full dose. She was also given dexamethasone 40 mg daily on days #2-4 (10/23 -10/24) which likely contributed to her hyperglycemia.   3. Pancytopenia as noted #1 secondary to chemotherapy.  She should be approaching her expected nadir.  --Supportive packed RBC and platelets transfusions prn. Neulasta as noted above. Will discuss holding next dose of Neulasta (Neulasta is long-acting). Do we need continued daily dosing?  -  4. History of atrial fibrillation: Diltiazem, pacemaker. Coumadin being held secondary to #3.   5. Hypothyroidism: Levothyroxine per primary team   6. Hypertension: Diltiazem per primary team   7. Diabetes mellitus, type II: Increase Lantus to 20 units daily (uncontrolled secondary to steroids) continue NovoLog q. a.c. at current dose, Continue resistant SSI   8. Creatinine clearance. Avoiding nephrotoxins. Adjusting medications based on CC.   9. Disposition: Full Code. PT/OT as tolerated.    Code Status: Full Family Communication: Disposition Plan:   Consultants:  Oncology  Procedures:    Antibiotics:  Cefepime 11/2>>>    HPI/Subjective: 72 yo WF PMHx   Non-Hodgkin lymphoma, atrial fibrillation (on coumadin), status post pacemaker placement for sick sinus rhythm, diabetes who presented to [facility] ED 02/11/2013 for fever of 100.4 F. Patietn was instructed by an oncologist to go to ED for evaluation.  Vitals were stable in ED with BP 96/58 which has improved to 114/67 with initial IV fluids. T max was 100.8 F and HR 55 - 94. Oxygen saturation was 96% on room air. CXR showed no active cardiopulmonary disease. CBC revealed WBC count of 2.4, hemoglobin of 10.3 and platelets of 30. BMP revealed sodium of 130 and creatinine of 1.31. TODAY patient comfortably in chair consuming a meal no complaints    Objective: Filed Vitals:   02/14/13 1128 02/14/13 1344 02/14/13 2007 02/15/13 0408  BP: 107/47 95/41 108/65 120/60  Pulse: 73 75 71 73  Temp: 97.5 F (36.4 C) 97.8 F (36.6 C) 98.3 F (36.8 C) 98.1 F (36.7 C)  TempSrc: Oral Oral Oral Oral  Resp: 16 17 18 18   Height:      Weight:    97.5 kg (214 lb 15.2 oz)  SpO2: 99% 100% 98% 98%    Intake/Output Summary (Last 24 hours) at 02/15/13 0806 Last data filed at 02/15/13 0401  Gross per 24 hour  Intake  967.5 ml  Output   1950 ml  Net -982.5 ml   Filed Weights   02/12/13 0200 02/14/13 0451 02/15/13 0408  Weight: 97.6 kg (215 lb 2.7 oz) 97.5 kg (214 lb 15.2 oz) 97.5 kg (214 lb 15.2 oz)    Exam:   General: A./O. x4, NAD  Cardiovascular: Regular rhythm and rate, negative murmurs rubs gallops, DP/PT pulse +1 bilateral  Respiratory: Clear to ossification bilateral (does have a raspy cough)  Abdomen: Obese, nontender, nondistended, plus bowel  sounds  Musculoskeletal: Negative pedal edema bilateral   Data Reviewed: Basic Metabolic Panel:  Recent Labs Lab 02/11/13 2333 02/12/13 0510 02/13/13 0920 02/14/13 0509 02/15/13 0515  NA 130* 133* 130* 134* 133*  K 4.7 4.4 4.6 4.4 4.5  CL 93* 99 94* 100 98  CO2 27 25 24 25 25   GLUCOSE 151* 137* 379* 237* 268*  BUN 20 21 30* 33* 33*  CREATININE  1.31* 1.48* 1.20* 1.18* 1.09  CALCIUM 10.2 9.7 9.7 9.7 10.0   Liver Function Tests:  Recent Labs Lab 02/09/13 0930 02/11/13 2333  AST 19 11  ALT 45 19  ALKPHOS 52 49  BILITOT 0.69 0.5  PROT 7.0 6.3  ALBUMIN 3.8 3.2*    Recent Labs Lab 02/11/13 2333  LIPASE 38   No results found for this basename: AMMONIA,  in the last 168 hours CBC:  Recent Labs Lab 02/09/13 0930 02/11/13 2333 02/12/13 0510 02/13/13 0920 02/14/13 0509 02/15/13 0515  WBC 4.7 2.4* 1.5* 0.5* 0.3* 0.3*  NEUTROABS 3.9  --   --   --  0.0* 0.0*  HGB 12.6 10.3* 9.1* 10.3* 9.3* 8.5*  HCT 37.3 29.6* 26.9* 29.2* 26.0* 23.8*  MCV 95.7 94.6 95.1 93.9 92.9 91.9  PLT 134* 30* 23* 15* 11* 27*   Cardiac Enzymes: No results found for this basename: CKTOTAL, CKMB, CKMBINDEX, TROPONINI,  in the last 168 hours BNP (last 3 results) No results found for this basename: PROBNP,  in the last 8760 hours CBG:  Recent Labs Lab 02/14/13 0745 02/14/13 1233 02/14/13 1701 02/14/13 2127 02/15/13 0732  GLUCAP 223* 285* 275* 295* 253*    Recent Results (from the past 240 hour(s))  CULTURE, BLOOD (ROUTINE X 2)     Status: None   Collection Time    02/11/13 11:33 PM      Result Value Range Status   Specimen Description BLOOD LEFT ANTECUBITAL   Final   Special Requests BOTTLES DRAWN AEROBIC AND ANAEROBIC 5CC   Final   Culture  Setup Time     Final   Value: 02/12/2013 13:51     Performed at [facility]   Culture     Final   Value:        BLOOD CULTURE RECEIVED NO GROWTH TO DATE CULTURE WILL BE HELD FOR 5 DAYS BEFORE ISSUING A FINAL NEGATIVE REPORT     Performed at [facility]   Report Status PENDING   Incomplete  CULTURE, BLOOD (ROUTINE X 2)     Status: None   Collection Time    02/11/13 11:49 PM      Result Value Range Status   Specimen Description BLOOD RIGHT ANTECUBITAL   Final   Special Requests BOTTLES DRAWN AEROBIC AND ANAEROBIC 10CC   Final   Culture  Setup Time     Final   Value:  02/12/2013 13:51     Performed at [facility]   Culture     Final   Value:        BLOOD CULTURE RECEIVED NO GROWTH TO DATE CULTURE WILL BE HELD FOR 5 DAYS BEFORE ISSUING A FINAL NEGATIVE REPORT     Performed at [facility]   Report Status PENDING   Incomplete  URINE CULTURE     Status: None   Collection Time    02/12/13 12:11 AM      Result Value Range Status   Specimen Description URINE, RANDOM   Final   Special Requests Normal  Final   Culture  Setup Time     Final   Value: 02/12/2013 18:14     Performed at [facility] Count     Final   Value: NO GROWTH     Performed at [facility]   Culture     Final   Value: NO GROWTH     Performed at [facility]   Report Status 02/13/2013 FINAL   Final     Studies: No results found.  Scheduled Meds: . acetaminophen  650 mg Oral Once  . allopurinol  300 mg Oral Daily  . ceFEPime (MAXIPIME) IV  2 g Intravenous Q12H  . citalopram  20 mg Oral q morning - 10a  . dexamethasone  40 mg Oral Daily  . diltiazem  180 mg Oral q morning - 10a  . diphenhydrAMINE  25 mg Oral Once  . docusate sodium  100 mg Oral BID  . filgrastim (NEUPOGEN)  SQ  480 mcg Subcutaneous q1800  . insulin aspart  0-20 Units Subcutaneous TID WC  . insulin aspart  0-5 Units Subcutaneous QHS  . insulin aspart  10 Units Subcutaneous TID WC  . insulin glargine  10 Units Subcutaneous QHS  . levothyroxine  50 mcg Oral QAC breakfast  . magnesium oxide  200 mg Oral Daily  . potassium chloride  20 mEq Oral Daily  . pravastatin  40 mg Oral QHS  . sodium chloride  3 mL Intravenous Q12H   Continuous Infusions:   Principal Problem:   Fever and neutropenia Active Problems:   Essential hypertension, benign   Atrial fibrillation   Cardiac pacemaker in situ   Diffuse large B cell lymphoma   Hypothyroidism   Diabetes mellitus type 2 in obese   Hyperlipidemia   Non-Hodgkin's lymphoma of abdomen   Depression    Antineoplastic chemotherapy induced pancytopenia   Acute renal failure   Anemia of chronic disease    Time spent: 40 minutes    Jaidin Richison, J  [facility] Pager [phone]. If 7PM-7AM, please contact night-coverage at [email or web address], password [facility] 02/15/2013, 8:06 AM  LOS: 4 days

## 2013-02-15 NOTE — Progress Notes (Signed)
ANTIBIOTIC CONSULT NOTE - FOLLOW UP  Pharmacy Consult for Cefepime Indication: Neutropenic fever  Allergies  Allergen Reactions  . Codeine Nausea Only   Labs:  Recent Labs  02/13/13 0920 02/14/13 0509 02/15/13 0515  WBC 0.5* 0.3* 0.3*  HGB 10.3* 9.3* 8.5*  PLT 15* 11* 27*  CREATININE 1.20* 1.18* 1.09    Assessment: 72 yoF with relapsed non-hodgkin's lymphoma s/p cycle 4/4 chemo (gemcitabine/cisplatin) 10/23 (D1) and 10/30 (D8) presented 11/1 with fever and WBC 1.5 (with anticipated further decline following chemo). CXR and UA negative. Pt not septic appearing per MD. Cefepime ordered empirically but suspecting viral URI since pt was experiencing some mild upper respiratory sx's prior to chemo.  02/12/2013 >> Cefepime >>  Tmax: afeb WBCs: 0.3K (ANC = 0) Renal: SCr 1.09, CG 45, N 53  11/1 blood x 2 >> NGTD 11/1 urine >> NGF  Today is D4 of Cefepime.  Pancytopenia worsened. Scr better. Neupogen added but note pt received Neulasta on 10/31 which should last ~2 weeks. Per med-onc, if bcx neg and ANC adequate can discharge on abx to complete 10d of therapy. If febrile, would add anti-fungals.   Plan:   Continue Cefepime 2g IV q12h  Would consider discontinuing Neupogen given recent Neulasta.   Geoffry Paradise, PharmD, BCPS Pager: [phone] 8:20 AM Pharmacy #: [phone]

## 2013-02-15 NOTE — Evaluation (Signed)
Physical Therapy Evaluation Patient Details Name: [name] MRN: [id number] DOB: 04-20-1940 Today's Date: 02/15/2013 Time: 7829-5621 PT Time Calculation (min): 19 min  PT Assessment / Plan / Recommendation History of Present Illness  Pt is a 72 y.o. female who presents to the ED with fever.  Fever is occuring in the setting of chemotherapy just 2 days ago for DLBCL.  The patient states that before chemo she did have URI like symptoms but went ahead with chemotherapy anyhow.  After developing a fever of 100.4 degrees today at home she called her oncologist as she was instructed to.  Oncology instructed her to present to the ED.  She has had a mild cough, non productive, but not really any other symptoms at this time.  Clinical Impression  Pt admitted for above. Pt currently presenting with functional limitations due to deficits listed below (see PT Problem List). Pt would benefit from skilled PT to increase independence and safety during mobility and to allow d/c to venue listed below. Pt able to perform bed mobility and stand pivot transfer with min assist, pt limited by weakness and fatigue. Pt reports she lives alone but plans to d/c home with son, son not home during the day but pt adamant about not going to SNF. However, pt concerned with traversing steps at son's home, pt would benefit practicing stairs prior to d/c. If pt's son unable to provide assist and pt unsafe to d/c home, pt may need to go to ST-SNF.    PT Assessment  Patient needs continued PT services    Follow Up Recommendations  Home health PT    Does the patient have the potential to tolerate intense rehabilitation      Barriers to Discharge Decreased caregiver support      Equipment Recommendations  None recommended by PT (TBD: perhaps RW)    Recommendations for Other Services     Frequency Min 3X/week    Precautions / Restrictions Precautions Precautions: Fall Restrictions Weight Bearing Restrictions: No    Pertinent Vitals/Pain No c/o pain during session.      Mobility  Bed Mobility Bed Mobility: Supine to Sit;Sitting - Scoot to Edge of Bed Supine to Sit: 4: Min assist Sitting - Scoot to [employer] of Bed: 4: Min guard Details for Bed Mobility Assistance: 1HHA to guide pt into sitting and min guard to ensure safety. VC's for hand placement. Transfers Transfers: Sit to Stand;Stand to [employer] Transfers Sit to Stand: 4: Min assist;From bed;With upper extremity assist Stand to Sit: 4: Min assist;To chair/3-in-1;With upper extremity assist;With armrests Stand Pivot Transfers: 4: Min guard Details for Transfer Assistance: assist to rise and steady without an AD, VC's for hand placment. Ambulation/Gait Ambulation/Gait Assistance: Not tested (comment) Ambulation/Gait Assistance Details: due to reported fatigue and LE weakness during transfer.    Exercises     PT Diagnosis: Difficulty walking;Generalized weakness  PT Problem List: Decreased strength;Decreased activity tolerance;Decreased balance;Decreased mobility;Decreased knowledge of use of DME PT Treatment Interventions: DME instruction;Gait training;Stair training;Functional mobility training;Therapeutic activities;Therapeutic exercise;Balance training;Neuromuscular re-education;Patient/family education     PT Goals(Current goals can be found in the care plan section) Acute Rehab PT Goals Patient Stated Goal: to go home PT Goal Formulation: With patient Time For Goal Achievement: 03/01/13 Potential to Achieve Goals: Good  Visit Information  Last PT Received On: 02/15/13 Assistance Needed: +1 History of Present Illness: Pt is a 72 y.o. female who presents to the ED with fever.  Fever is occuring in the setting of  chemotherapy just 2 days ago for DLBCL.  The patient states that before chemo she did have URI like symptoms but went ahead with chemotherapy anyhow.  After developing a fever of 100.4 degrees today at home she called her  oncologist as she was instructed to.  Oncology instructed her to present to the ED.  She has had a mild cough, non productive, but not really any other symptoms at this time.       Prior Functioning  Home Living Family/patient expects to be discharged to:: Private residence Living Arrangements: Alone Available Help at Discharge: Family;Available PRN/intermittently Type of Home: Mobile home Home Access: Stairs to enter Entrance Stairs-Number of Steps: 1 Entrance Stairs-Rails: None Home Layout: One level Home Equipment: Other (comment);Shower seat - built in;Cane - single point;Electric scooter;Bedside commode (3-wheeled walker) Additional Comments: pt reports she uses walker in the bathroom and electic scooter in home, pt reports she does not get out in the community much. Prior Function Level of Independence: Independent with assistive device(s) Comments: pt reports her nieces and son occassionlly assist pt at home and that she might d/c home with son for a few day. Communication Communication: No difficulties    Cognition  Cognition Arousal/Alertness: Awake/alert Behavior During Therapy: WFL for tasks assessed/performed Overall Cognitive Status: Within Functional Limits for tasks assessed    Extremity/Trunk Assessment Lower Extremity Assessment Lower Extremity Assessment: Generalized weakness   Balance    End of Session PT - End of Session Activity Tolerance: Patient limited by fatigue Patient left: in chair;with call bell/phone within reach Nurse Communication: Mobility status;Other (comment) (RN wanted notification when breakfast arrived, RN informed at end of session.)  GP     Sol Blazing 02/15/2013, 10:47 AM

## 2013-02-15 NOTE — Progress Notes (Signed)
Lengthy visit. Patient likes to be called "[name]." She talked about caring for one of her sons, [name], and her husband who has Alzheimer's and is a facility. She also has another son [name]. Patient was living independently before this hospitalization and wants to return to the life she had. She also has a strong [place] faith although she said she is not always "sure how to pray" for the various situations in her life. Brought her a prayer shawl and booklet.

## 2013-02-16 ENCOUNTER — Other Ambulatory Visit: Payer: Self-pay

## 2013-02-16 LAB — GLUCOSE, CAPILLARY
Glucose-Capillary: 188 mg/dL — ABNORMAL HIGH (ref 70–99)
Glucose-Capillary: 350 mg/dL — ABNORMAL HIGH (ref 70–99)
Glucose-Capillary: 280 mg/dL — ABNORMAL HIGH (ref 70–99)
Glucose-Capillary: 223 mg/dL — ABNORMAL HIGH (ref 70–99)

## 2013-02-16 LAB — BASIC METABOLIC PANEL
BUN: 39 mg/dL — ABNORMAL HIGH (ref 6–23)
Calcium: 9.8 mg/dL (ref 8.4–10.5)
Chloride: 98 mEq/L (ref 96–112)
GFR calc Af Amer: 55 mL/min — ABNORMAL LOW (ref >90)
GFR calc non Af Amer: 48 mL/min — ABNORMAL LOW (ref >90)
Glucose, Bld: 179 mg/dL — ABNORMAL HIGH (ref 70–99)
Sodium: 131 mEq/L — ABNORMAL LOW (ref 135–145)

## 2013-02-16 MED ORDER — INSULIN ASPART 100 UNIT/ML ~~LOC~~ SOLN
20.0000 [IU] | Freq: Three times a day (TID) | SUBCUTANEOUS | Status: DC
  Administered 2013-02-17 – 2013-02-18 (×4): 20 [IU] via SUBCUTANEOUS

## 2013-02-16 MED ORDER — INSULIN GLARGINE 100 UNIT/ML ~~LOC~~ SOLN
30.0000 [IU] | Freq: Every day | SUBCUTANEOUS | Status: DC
  Administered 2013-02-16 – 2013-02-17 (×2): 30 [IU] via SUBCUTANEOUS
  Filled 2013-02-16 (×3): qty 0.3

## 2013-02-16 MED ORDER — LIDOCAINE-PRILOCAINE 2.5-2.5 % EX CREA
TOPICAL_CREAM | Freq: Once | CUTANEOUS | Status: AC
  Administered 2013-02-16: 1 via TOPICAL
  Filled 2013-02-16: qty 5

## 2013-02-16 NOTE — Progress Notes (Signed)
[facility] PROGRESS NOTE  Ashni Lonzo Auer GMW:[id number] DOB: 1940/11/30 DOA: 02/11/2013 PCP: Nadean Corwin, MD  Assessment/Plan: Febrile neutropenia.  --Blood cultures NGTD, CXR and UA negative, patient empirically on cefepime.  --Patient given neulasta as noted above on 02/09/2013. Discussed with pharmacy the utility of providing additional neupogen given Neupogen is long-acting. H/D stable. Will DC Neupogen -Await WBC count recovery. If blood cultures are negative, ANC currently inadequate continue antibiotic coverage (complete ten-day course. ) -Patient not seen by oncology since 11/4 if no note in the a.m. please reconsult oncology   2. 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  --Prior chemotherapy dose adjusted with 50% of cisplatin given due to reduced creatinine clearance, gemcitabine reduced by 25% and Rituximab at full dose. Was given dexamethasone 40 mg daily on days #2-4 (10/23 -10/24) which likely contributed to her hyperglycemia.  -Will continue to dexamethasone 40 mg daily  3. Pancytopenia as noted #1 secondary to chemotherapy.  She should be approaching her expected nadir.  --Supportive packed RBC and platelets transfusions prn.  -DCed Neupogen as noted above.   -  4. History of atrial fibrillation: Diltiazem, pacemaker. Coumadin being held secondary to #3.   5. Hypothyroidism: Continue Levothyroxine 50 mcg daily    6. Hypertension: Continue Diltiazem 180 mg daily    7. Diabetes mellitus, type II: Increase Lantus to 30 units daily (uncontrolled secondary to steroids) continue NovoLog q. a.c. at 20 units , Continue resistant SSI   8. Creatinine clearance. Creatinine continues to improve ;Avoid nephrotoxins. Adjust medications based on CC.   Hyponatremia; patient has become slightly hyponatremic; asymptomatic will monitor at this point  Disposition: Full Code. PT/OT as tolerated.    Code Status: Full Family Communication: Disposition  Plan:   Consultants:  Oncology  Procedures:    Antibiotics:  Cefepime 11/2>>>    HPI/Subjective: 72 yo WF PMHx  Non-Hodgkin lymphoma, atrial fibrillation (on coumadin), status post pacemaker placement for sick sinus rhythm, diabetes who presented to [facility] ED 02/11/2013 for fever of 100.4 F. Patietn was instructed by an oncologist to go to ED for evaluation.  Vitals were stable in ED with BP 96/58 which has improved to 114/67 with initial IV fluids. T max was 100.8 F and HR 55 - 94. Oxygen saturation was 96% on room air. CXR showed no active cardiopulmonary disease. CBC revealed WBC count of 2.4, hemoglobin of 10.3 and platelets of 30. BMP revealed sodium of 130 and creatinine of 1.31. 02/15/2013 patient comfortably in chair consuming a meal no complaints. TODAY patient laying in bed comfortably, stating she had not been out of bed today. Only complaint was that her peripheral IV line had infiltrated and she requested that the medical team use her chemotherapy port.    Objective: Filed Vitals:   02/15/13 2045 02/16/13 0550 02/16/13 1437 02/16/13 2111  BP: 117/64 139/67 128/54 118/61  Pulse: 70 74 84 58  Temp: 98 F (36.7 C) 98.2 F (36.8 C) 97.9 F (36.6 C) 98.2 F (36.8 C)  TempSrc: Oral Oral Oral Oral  Resp: 17 16 18 18   Height:      Weight:      SpO2: 97% 98% 95% 99%    Intake/Output Summary (Last 24 hours) at 02/16/13 2142 Last data filed at 02/16/13 2112  Gross per 24 hour  Intake   1000 ml  Output    800 ml  Net    200 ml   Filed Weights   02/12/13 0200 02/14/13 0451 02/15/13 0408  Weight: 97.6 kg (215 lb 2.7 oz) 97.5 kg (214 lb 15.2 oz) 97.5 kg (214 lb 15.2 oz)    Exam:   General: A./O. x4, NAD  Cardiovascular: Regular rhythm and rate, negative murmurs rubs gallops, DP/PT pulse +1 bilateral  Respiratory: Clear to ossification bilateral (does have a raspy cough)  Abdomen: Obese, nontender, nondistended, plus bowel sounds  Musculoskeletal: Negative pedal  edema bilateral, antecubital bruising in the right antecubital area secondary to infiltration of peripheral IV    Data Reviewed: Basic Metabolic Panel:  Recent Labs Lab 02/12/13 0510 02/13/13 0920 02/14/13 0509 02/15/13 0515 02/16/13 0511  NA 133* 130* 134* 133* 131*  K 4.4 4.6 4.4 4.5 4.3  CL 99 94* 100 98 98  CO2 25 24 25 25 23   GLUCOSE 137* 379* 237* 268* 179*  BUN 21 30* 33* 33* 39*  CREATININE 1.48* 1.20* 1.18* 1.09 1.12*  CALCIUM 9.7 9.7 9.7 10.0 9.8   Liver Function Tests:  Recent Labs Lab 02/11/13 2333  AST 11  ALT 19  ALKPHOS 49  BILITOT 0.5  PROT 6.3  ALBUMIN 3.2*    Recent Labs Lab 02/11/13 2333  LIPASE 38   No results found for this basename: AMMONIA,  in the last 168 hours CBC:  Recent Labs Lab 02/11/13 2333 02/12/13 0510 02/13/13 0920 02/14/13 0509 02/15/13 0515  WBC 2.4* 1.5* 0.5* 0.3* 0.3*  NEUTROABS  --   --   --  0.0* 0.0*  HGB 10.3* 9.1* 10.3* 9.3* 8.5*  HCT 29.6* 26.9* 29.2* 26.0* 23.8*  MCV 94.6 95.1 93.9 92.9 91.9  PLT 30* 23* 15* 11* 27*   Cardiac Enzymes: No results found for this basename: CKTOTAL, CKMB, CKMBINDEX, TROPONINI,  in the last 168 hours BNP (last 3 results) No results found for this basename: PROBNP,  in the last 8760 hours CBG:  Recent Labs Lab 02/15/13 2043 02/16/13 0721 02/16/13 1157 02/16/13 1707 02/16/13 2109  GLUCAP 174* 188* 350* 280* 223*    Recent Results (from the past 240 hour(s))  CULTURE, BLOOD (ROUTINE X 2)     Status: None   Collection Time    02/11/13 11:33 PM      Result Value Range Status   Specimen Description BLOOD LEFT ANTECUBITAL   Final   Special Requests BOTTLES DRAWN AEROBIC AND ANAEROBIC 5CC   Final   Culture  Setup Time     Final   Value: 02/12/2013 13:51     Performed at [facility]   Culture     Final   Value:        BLOOD CULTURE RECEIVED NO GROWTH TO DATE CULTURE WILL BE HELD FOR 5 DAYS BEFORE ISSUING A FINAL NEGATIVE REPORT     Performed at [facility]   Report Status PENDING   Incomplete  CULTURE, BLOOD (ROUTINE X 2)     Status: None   Collection Time    02/11/13 11:49 PM      Result Value Range Status   Specimen Description BLOOD RIGHT ANTECUBITAL   Final   Special Requests BOTTLES DRAWN AEROBIC AND ANAEROBIC 10CC   Final   Culture  Setup Time     Final   Value: 02/12/2013 13:51     Performed at [facility]   Culture     Final   Value:        BLOOD CULTURE RECEIVED NO GROWTH TO DATE CULTURE WILL BE HELD FOR 5 DAYS BEFORE ISSUING A FINAL NEGATIVE REPORT  Performed at [facility]   Report Status PENDING   Incomplete  URINE CULTURE     Status: None   Collection Time    02/12/13 12:11 AM      Result Value Range Status   Specimen Description URINE, RANDOM   Final   Special Requests Normal   Final   Culture  Setup Time     Final   Value: 02/12/2013 18:14     Performed at [facility] Count     Final   Value: NO GROWTH     Performed at [facility]   Culture     Final   Value: NO GROWTH     Performed at [facility]   Report Status 02/13/2013 FINAL   Final     Studies: No results found.  Scheduled Meds: . acetaminophen  650 mg Oral Once  . allopurinol  300 mg Oral Daily  . ceFEPime (MAXIPIME) IV  2 g Intravenous Q12H  . citalopram  20 mg Oral q morning - 10a  . dexamethasone  40 mg Oral Daily  . diltiazem  180 mg Oral q morning - 10a  . diphenhydrAMINE  25 mg Oral Once  . docusate sodium  100 mg Oral BID  . filgrastim (NEUPOGEN)  SQ  480 mcg Subcutaneous q1800  . insulin aspart  0-20 Units Subcutaneous TID WC  . insulin aspart  0-5 Units Subcutaneous QHS  . insulin aspart  10 Units Subcutaneous TID WC  . insulin glargine  20 Units Subcutaneous QHS  . levothyroxine  50 mcg Oral QAC breakfast  . magnesium oxide  200 mg Oral Daily  . potassium chloride  20 mEq Oral Daily  . pravastatin  40 mg Oral QHS  . sodium chloride  3 mL Intravenous Q12H   Continuous  Infusions:   Principal Problem:   Fever and neutropenia Active Problems:   Essential hypertension, benign   Atrial fibrillation   Cardiac pacemaker in situ   Diffuse large B cell lymphoma   Hypothyroidism   Diabetes mellitus type 2 in obese   Hyperlipidemia   Non-Hodgkin's lymphoma of abdomen   Depression   Antineoplastic chemotherapy induced pancytopenia   Acute renal failure   Anemia of chronic disease    Time spent: 40 minutes    WOODS, CURTIS, J  [facility] Pager [phone]. If 7PM-7AM, please contact night-coverage at [email or web address], password [facility] 02/16/2013, 9:42 PM  LOS: 5 days

## 2013-02-17 ENCOUNTER — Other Ambulatory Visit: Payer: Self-pay | Admitting: Internal Medicine

## 2013-02-17 DIAGNOSIS — F29 Unspecified psychosis not due to a substance or known physiological condition: Secondary | ICD-10-CM

## 2013-02-17 DIAGNOSIS — T451X5A Adverse effect of antineoplastic and immunosuppressive drugs, initial encounter: Secondary | ICD-10-CM

## 2013-02-17 DIAGNOSIS — I10 Essential (primary) hypertension: Secondary | ICD-10-CM

## 2013-02-17 LAB — CBC
HCT: 24 % — ABNORMAL LOW (ref 36.0–46.0)
Hemoglobin: 8.6 g/dL — ABNORMAL LOW (ref 12.0–15.0)
MCH: 32.1 pg (ref 26.0–34.0)
RBC: 2.68 MIL/uL — ABNORMAL LOW (ref 3.87–5.11)

## 2013-02-17 LAB — BASIC METABOLIC PANEL
BUN: 43 mg/dL — ABNORMAL HIGH (ref 6–23)
CO2: 25 mEq/L (ref 19–32)
Calcium: 9.9 mg/dL (ref 8.4–10.5)
Glucose, Bld: 162 mg/dL — ABNORMAL HIGH (ref 70–99)
Potassium: 4.1 mEq/L (ref 3.5–5.1)
Sodium: 132 mEq/L — ABNORMAL LOW (ref 135–145)

## 2013-02-17 LAB — GLUCOSE, CAPILLARY
Glucose-Capillary: 154 mg/dL — ABNORMAL HIGH (ref 70–99)
Glucose-Capillary: 267 mg/dL — ABNORMAL HIGH (ref 70–99)

## 2013-02-17 NOTE — Progress Notes (Signed)
Physical Therapy Treatment Patient Details Name: [name] MRN: [id number] DOB: 08-17-1940 Today's Date: 02/17/2013 Time: 7829-5621 PT Time Calculation (min): 26 min  PT Assessment / Plan / Recommendation  History of Present Illness Pt is a 72 y.o. female who presents to the ED with fever.  Fever is occuring in the setting of chemotherapy just 2 days ago for DLBCL.  The patient states that before chemo she did have URI like symptoms but went ahead with chemotherapy anyhow.  After developing a fever of 100.4 degrees today at home she called her oncologist as she was instructed to.  Oncology instructed her to present to the ED.  She has had a mild cough, non productive, but not really any other symptoms at this time.   PT Comments   Pt demonstrating progress as she was able to ambulate today and perform LE strengthening exercises. Pt would continue to benefit from skilled PT to improve functional mobility and safety. Pt would benefit from practicing stairs prior to d/c to son's house, as son's home has stairs to enter. Pt did not wish to ambulate outside room today.   Follow Up Recommendations  Home health PT     Does the patient have the potential to tolerate intense rehabilitation     Barriers to Discharge        Equipment Recommendations  None recommended by PT    Recommendations for Other Services    Frequency Min 3X/week   Progress towards PT Goals Progress towards PT goals: Progressing toward goals  Plan Current plan remains appropriate    Precautions / Restrictions Precautions Precautions: Fall Restrictions Weight Bearing Restrictions: No   Pertinent Vitals/Pain No c/o of pain during session. Pt reports fatigue during exercises, but was able to perform exercises after rest breaks between sets.    Mobility  Bed Mobility Bed Mobility: Supine to Sit;Sitting - Scoot to Edge of Bed Supine to Sit: 4: Min guard;With rails Sitting - Scoot to Edge of Bed: 4: Min  guard Details for Bed Mobility Assistance: min guard to ensure safety.  Transfers Transfers: Sit to Stand;Stand to Sit Sit to Stand: 4: Min assist;With upper extremity assist;From bed Stand to Sit: 4: Min guard;With upper extremity assist;With armrests;To chair/3-in-1 Details for Transfer Assistance: assist to rise and to control descent. VC's for hand placement. Ambulation/Gait Ambulation/Gait Assistance: 4: Min guard Ambulation Distance (Feet): 20 Feet Assistive device: Rolling walker Ambulation/Gait Assistance Details: min guard to ensure safety. VC's for gait sequence, to stay close to the RW, and to negotiate objects. Gait Pattern: Step-through pattern;Decreased stride length;Wide base of support Gait velocity: decreased    Exercises General Exercises - Lower Extremity Ankle Circles/Pumps: AROM;20 reps;Seated;Both (rest breaks between all exercises due to fatigue) Short Arc Quad: 20 reps;AROM;Supine;Both (2 sets of 10) Long Arc Quad: AROM;20 reps;Seated;Both (2 sets of 10) Heel Slides: AROM;20 reps;Supine;Both Hip ABduction/ADduction: AROM;20 reps;Supine;Both Hip Flexion/Marching: AROM;20 reps;Seated;Both   PT Diagnosis:    PT Problem List:   PT Treatment Interventions:     PT Goals (current goals can now be found in the care plan section)    Visit Information  Last PT Received On: 02/17/13 Assistance Needed: +1 History of Present Illness: Pt is a 72 y.o. female who presents to the ED with fever.  Fever is occuring in the setting of chemotherapy just 2 days ago for DLBCL.  The patient states that before chemo she did have URI like symptoms but went ahead with chemotherapy anyhow.  After developing a  fever of 100.4 degrees today at home she called her oncologist as she was instructed to.  Oncology instructed her to present to the ED.  She has had a mild cough, non productive, but not really any other symptoms at this time.    Subjective Data      Cognition   Cognition Arousal/Alertness: Awake/alert Behavior During Therapy: WFL for tasks assessed/performed Overall Cognitive Status: No family/caregiver present to determine baseline cognitive functioning (pt has difficulty with short-term memory and word finding)    Balance     End of Session PT - End of Session Activity Tolerance: Patient limited by fatigue Patient left: in chair;with call bell/phone within reach Nurse Communication: Mobility status   GP     Sol Blazing 02/17/2013, 12:18 PM

## 2013-02-17 NOTE — Progress Notes (Addendum)
[facility] PROGRESS NOTE  [name] WUJ:[id number] DOB: 1941/03/30 DOA: 02/11/2013 PCP: Nadean Corwin, MD  Brief narrative: 72 year old female with past medical history of Non-Hodgkin lymphoma, atrial fibrillation (on coumadin), status post pacemaker placement for sick sinus rhythm, diabetes who presented to [facility] ED 02/11/2013 for fever of 100.4 F. Patietn was instructed by an oncologist to go to ED for evaluation.  In ED, vitals were stable with BP 96/58 which has improved to 114/67 with initial IV fluids. T max was 100.8 F and HR 55 - 94. Oxygen saturation was 96% on room air. CXR showed no active cardiopulmonary disease. CBC revealed WBC count of 2.4, hemoglobin of 10.3 and platelets of 30. BMP revealed sodium of 130 and creatinine of 1.31.  Hospital course is complicated due to ongoing pancytopenia.  Assessment/Plan:   Principal Problem:  Fever and neutropenia  - secondary to sequela of chemotherapy  - WBC count 0.3; Neupogen started 02/13/2013 but subsequently discontinued 02/16/2013 as per pharmacy recommendations the patient received Neulasta support on 02/09/2013 - continue cefepime for now until CBC result available; If there is an improvement in CBC (WBC more specifically) then will switch Abx to PO regimen - Blood cultures to date show no growth, urine cultures show no growth Active Problems:  Pancytopenia  - secondary to history of malignancy and sequela of chemotherapy  - Please note that for low platelet count patient has received one unit of platelets on 02/14/2013. - For low white blood cell count patient received Neupogen starting 02/13/2013 but Neupogen is now discontinued - Followup CBC today Essential hypertension, benign  - continue Cardizem 180 mg daily  - reasonable inpatient control; BP 126/68 Atrial fibrillation  - on Cardizem  - Continue to hold Coumadin due to thrombocytopenia Diffuse large B cell lymphoma  - Dr. Rosie Fate informed of patient's  admission  - will d/c Decadron per oncology rec's - allopurinol 300 mg daily Hypothyroidism  - continue synthroid 50 mcg daily  Diabetes mellitus type 2  - A1c 5.9  - will continue to hold metformin due too acute renal failure, please do not restart until renal function improves  - continue SSI for now, meal coverage with Novolog 20 units TID and Lantus 30 units at bedtime Hyperlipidemia  - continue statin therapy  Depression  - continue celexa  Acute renal failure  - likely pre-renal etiology, dehydration  - creatinine ranges from 1.09 to 1.17   Code Status: full code  Family Communication: no family at the bedside  Disposition Plan: remains inpatient   Consultants:  Oncology (Dr. Rosie Fate) Procedures:  None  Antibiotics:  Cefepime 02/12/2013 -->  HPI/Subjective: No events overnight.   Objective: Filed Vitals:   02/16/13 1437 02/16/13 2111 02/17/13 0526 02/17/13 0535  BP: 128/54 118/61 118/70 126/68  Pulse: 84 58 60 70  Temp: 97.9 F (36.6 C) 98.2 F (36.8 C) 98.3 F (36.8 C) 98.3 F (36.8 C)  TempSrc: Oral Oral Oral Oral  Resp: 18 18 18 18   Height:      Weight:   97.8 kg (215 lb 9.8 oz) 97.6 kg (215 lb 2.7 oz)  SpO2: 95% 99% 99% 97%    Intake/Output Summary (Last 24 hours) at 02/17/13 1058 Last data filed at 02/17/13 0900  Gross per 24 hour  Intake   1050 ml  Output    600 ml  Net    450 ml    Exam:   General:  Pt is alert, follows commands appropriately, not in  acute distress  Cardiovascular: irregular rhythm, rate controlled, (+) S1, S2  Respiratory: Clear to auscultation bilaterally, no wheezing, no crackles, no rhonchi  Abdomen: Soft, non tender, non distended, bowel sounds present, no guarding  Extremities: Pulses DP and PT palpable bilaterally  Neuro: Grossly nonfocal  Data Reviewed: Basic Metabolic Panel:  Recent Labs Lab 02/13/13 0920 02/14/13 0509 02/15/13 0515 02/16/13 0511 02/17/13 0400  NA 130* 134* 133* 131* 132*  K 4.6 4.4  4.5 4.3 4.1  CL 94* 100 98 98 98  CO2 24 25 25 23 25   GLUCOSE 379* 237* 268* 179* 162*  BUN 30* 33* 33* 39* 43*  CREATININE 1.20* 1.18* 1.09 1.12* 1.17*  CALCIUM 9.7 9.7 10.0 9.8 9.9   Liver Function Tests:  Recent Labs Lab 02/11/13 2333  AST 11  ALT 19  ALKPHOS 49  BILITOT 0.5  PROT 6.3  ALBUMIN 3.2*    Recent Labs Lab 02/11/13 2333  LIPASE 38   No results found for this basename: AMMONIA,  in the last 168 hours CBC:  Recent Labs Lab 02/11/13 2333 02/12/13 0510 02/13/13 0920 02/14/13 0509 02/15/13 0515  WBC 2.4* 1.5* 0.5* 0.3* 0.3*  NEUTROABS  --   --   --  0.0* 0.0*  HGB 10.3* 9.1* 10.3* 9.3* 8.5*  HCT 29.6* 26.9* 29.2* 26.0* 23.8*  MCV 94.6 95.1 93.9 92.9 91.9  PLT 30* 23* 15* 11* 27*   Cardiac Enzymes: No results found for this basename: CKTOTAL, CKMB, CKMBINDEX, TROPONINI,  in the last 168 hours BNP: No components found with this basename: POCBNP,  CBG:  Recent Labs Lab 02/16/13 0721 02/16/13 1157 02/16/13 1707 02/16/13 2109 02/17/13 0727  GLUCAP 188* 350* 280* 223* 154*    CULTURE, BLOOD (ROUTINE X 2)     Status: None   Collection Time    02/11/13 11:33 PM      Result Value Range Status   Specimen Description BLOOD LEFT ANTECUBITAL   Final   Value:        BLOOD CULTURE RECEIVED NO GROWTH TO DATE CULTURE WILL BE HELD FOR 5 DAYS BEFORE ISSUING A FINAL NEGATIVE REPORT     Performed at [facility]   Report Status PENDING   Incomplete  CULTURE, BLOOD (ROUTINE X 2)     Status: None   Collection Time    02/11/13 11:49 PM      Result Value Range Status   Specimen Description BLOOD RIGHT ANTECUBITAL   Final   Value:        BLOOD CULTURE RECEIVED NO GROWTH TO DATE CULTURE WILL BE HELD FOR 5 DAYS BEFORE ISSUING A FINAL NEGATIVE REPORT     Performed at [facility]   Report Status PENDING   Incomplete  URINE CULTURE     Status: None   Collection Time    02/12/13 12:11 AM      Result Value Range Status   Specimen Description  URINE, RANDOM   Final   Value: NO GROWTH     Performed at [facility]   Report Status 02/13/2013 FINAL   Final     Scheduled Meds: . allopurinol  300 mg Oral Daily  . ceFEPime (MAXIPIME) IV  2 g Intravenous Q12H  . citalopram  20 mg Oral q morning - 10a  . dexamethasone  40 mg Oral Daily  . diltiazem  180 mg Oral q morning - 10a  . diphenhydrAMINE  25 mg Oral Once  . docusate sodium  100 mg  Oral BID  . insulin aspart  0-20 Units Subcutaneous TID WC  . insulin aspart  0-5 Units Subcutaneous QHS  . insulin aspart  20 Units Subcutaneous TID WC  . insulin glargine  30 Units Subcutaneous QHS  . levothyroxine  50 mcg Oral QAC breakfast  . magnesium oxide  200 mg Oral Daily  . potassium chloride  20 mEq Oral Daily  . pravastatin  40 mg Oral QHS     Debbora Presto, MD  [facility] Pager [phone]  If 7PM-7AM, please contact night-coverage [email or web address] Password [email or web address] 02/17/2013, 10:58 AM   LOS: 6 days

## 2013-02-17 NOTE — Progress Notes (Signed)
Inpatient Diabetes Program Recommendations  AACE/ADA: New Consensus Statement on Inpatient Glycemic Control (2013)  Target Ranges:  Prepandial:   less than 140 mg/dL      Peak postprandial:   less than 180 mg/dL (1-2 hours)      Critically ill patients:  140 - 180 mg/dL   Reason for Visit: Hyperglycemia  Dexamethasone discontinued.  Will likely need less basal and meal coverage insulin.  Would recommend reducing both Lantus and Novolog by half to prevent hypoglycemia.  Will continue to follow. Thank you. Ailene Ards, RD, LDN, CDE Inpatient Diabetes Coordinator [phone]

## 2013-02-17 NOTE — Progress Notes (Signed)
ANTIBIOTIC CONSULT NOTE - FOLLOW UP  Pharmacy Consult for Cefepime Indication: empirically given neutropenia  Allergies  Allergen Reactions  . Codeine Nausea Only    Patient Measurements: Height: 4\' 8"  (142.2 cm) Weight: 215 lb 2.7 oz (97.6 kg) IBW/kg (Calculated) : 36.3  Labs:  Recent Labs  02/15/13 0515 02/16/13 0511 02/17/13 0400  WBC 0.3*  --   --   HGB 8.5*  --   --   PLT 27*  --   --   CREATININE 1.09 1.12* 1.17*    Assessment: 22 yoF with relapsed non-hodgkin's lymphoma s/p cycle 4/4 chemo (gemcitabine/cisplatin) 10/23 (D1) and 10/30 (D8) presented 11/1 with fever and WBC 1.5 (with anticipated further decline following chemo). CXR and UA negative. Pt not septic appearing per MD. Cefepime ordered empirically but suspecting viral URI since pt was experiencing some mild upper respiratory sx's prior to chemo  02/12/2013 >> Cefepime >>  Tmax: afeb WBCs: 0.3K (ANC = 0), last 11/5 Renal: SCr 1.17, CG 42, N 49  11/1 blood x 2 >> NGTD 11/1 urine >> NGF  11/7: Today is D#7 Cefepime empirically for neutropenia. Pancytopenia remains,  Predicted nadir is ~10-14d for Gemcitabine, 14-23d for Cisplatin. Neupogen added but pt received Neulasta on 10/31 so was discontinued by MD. Per med-onc, if bcx neg and ANC adequate can discharge on abx to complete 10d of therapy. No CBC since 11/5.   Plan:   Continue Cefepime 2g IV q12h  MD, consider checking CBC with differential to trend counts and can narrow antibiotics  When narrowing abx, consider Ciprofloxacin + Amox/Clav OR Moxifloxacin alone   Geoffry Paradise, PharmD, BCPS Pager: [phone] 7:34 AM Pharmacy #: [id number]

## 2013-02-17 NOTE — Progress Notes (Addendum)
[name]   DOB:1941-04-04   OZ#:[id number]   QQV#:[id number]  Subjective:  Patient reports cough this morning but with overall improvement. She denies any further nose bleeds.  Her family including her two nieces and daughter-in-law and son are at here at the bedside.  They were concerned about her "forgetfullness" and appeared confused at times.   She denies subjective fevers or chills . She reports receiving one unit of plts today.   She reports ambulating the halls.  Her appetite is good.  Her last bowel movement was yesterday.   Objective:  Filed Vitals:   02/17/13 0535  BP: 126/68  Pulse: 70  Temp: 98.3 F (36.8 C)  Resp: 18    Body mass index is 48.27 kg/(m^2).  Intake/Output Summary (Last 24 hours) at 02/17/13 1316 Last data filed at 02/17/13 0900  Gross per 24 hour  Intake    930 ml  Output    600 ml  Net    330 ml    General: Sitting in chair, NAD, non-toxic appearing  Sclerae unicteric  Oropharynx clear  No peripheral adenopathy  Lungs clear -- no rales or rhonchi  Heart regular rate and rhythm  Abdomen benign, obese  MSK no focal spinal tenderness, no peripheral edema  Neuro nonfocal   CBG (last 3)   Recent Labs  02/16/13 1707 02/16/13 2109 02/17/13 0727  GLUCAP 280* 223* 154*     Labs:  Lab Results  Component Value Date   WBC 0.9* 02/17/2013   HGB 8.6* 02/17/2013   HCT 24.0* 02/17/2013   MCV 89.6 02/17/2013   PLT 12* 02/17/2013   NEUTROABS 0.0* 02/15/2013    Basic Metabolic Panel:  Recent Labs Lab 02/13/13 0920 02/14/13 0509 02/15/13 0515 02/16/13 0511 02/17/13 0400  NA 130* 134* 133* 131* 132*  K 4.6 4.4 4.5 4.3 4.1  CL 94* 100 98 98 98  CO2 24 25 25 23 25   GLUCOSE 379* 237* 268* 179* 162*  BUN 30* 33* 33* 39* 43*  CREATININE 1.20* 1.18* 1.09 1.12* 1.17*  CALCIUM 9.7 9.7 10.0 9.8 9.9   GFR Estimated Creatinine Clearance: 41.7 ml/min (by C-G formula based on Cr of 1.17). Liver Function Tests:  Recent Labs Lab 02/11/13 2333  AST  11  ALT 19  ALKPHOS 49  BILITOT 0.5  PROT 6.3  ALBUMIN 3.2*    Recent Labs Lab 02/11/13 2333  LIPASE 38   Coagulation profile  Recent Labs Lab 02/12/13 0510  INR 1.53*    CBC:  Recent Labs Lab 02/12/13 0510 02/13/13 0920 02/14/13 0509 02/15/13 0515 02/17/13 1053  WBC 1.5* 0.5* 0.3* 0.3* 0.9*  NEUTROABS  --   --  0.0* 0.0*  --   HGB 9.1* 10.3* 9.3* 8.5* 8.6*  HCT 26.9* 29.2* 26.0* 23.8* 24.0*  MCV 95.1 93.9 92.9 91.9 89.6  PLT 23* 15* 11* 27* 12*   CBG:  Recent Labs Lab 02/16/13 0721 02/16/13 1157 02/16/13 1707 02/16/13 2109 02/17/13 0727  GLUCAP 188* 350* 280* 223* 154*   Microbiology Recent Results (from the past 240 hour(s))  CULTURE, BLOOD (ROUTINE X 2)     Status: None   Collection Time    02/11/13 11:33 PM      Result Value Range Status   Specimen Description BLOOD LEFT ANTECUBITAL   Final   Special Requests BOTTLES DRAWN AEROBIC AND ANAEROBIC 5CC   Final   Culture  Setup Time     Final   Value: 02/12/2013 13:51  Performed at [facility]     Final   Value:        BLOOD CULTURE RECEIVED NO GROWTH TO DATE CULTURE WILL BE HELD FOR 5 DAYS BEFORE ISSUING A FINAL NEGATIVE REPORT     Performed at [facility]   Report Status PENDING   Incomplete  CULTURE, BLOOD (ROUTINE X 2)     Status: None   Collection Time    02/11/13 11:49 PM      Result Value Range Status   Specimen Description BLOOD RIGHT ANTECUBITAL   Final   Special Requests BOTTLES DRAWN AEROBIC AND ANAEROBIC 10CC   Final   Culture  Setup Time     Final   Value: 02/12/2013 13:51     Performed at [facility]   Culture     Final   Value:        BLOOD CULTURE RECEIVED NO GROWTH TO DATE CULTURE WILL BE HELD FOR 5 DAYS BEFORE ISSUING A FINAL NEGATIVE REPORT     Performed at [facility]   Report Status PENDING   Incomplete  URINE CULTURE     Status: None   Collection Time    02/12/13 12:11 AM      Result Value Range Status   Specimen  Description URINE, RANDOM   Final   Special Requests Normal   Final   Culture  Setup Time     Final   Value: 02/12/2013 18:14     Performed at [facility]     Final   Value: NO GROWTH     Performed at [facility]   Culture     Final   Value: NO GROWTH     Performed at [facility]   Report Status 02/13/2013 FINAL   Final   Assessment: 72 y.o. female with a history of Lymphoma, non-Hodgkin's, R mesentery started salvage chemotherapy with R-GPD on 10/26/12 on q 3week cycles. Her last dose was on 02/01/2013 following 4 cycles. She received neupogen on day# 9 (02/09/2013). She was admitted on 02/12/2013 secondary febrile neutropenia.   Her R-GPD is  (Rituxan day1; Gemzar day 1 and 8; Cisplatin day 1; and Dexamethasone IV day 1 and oral days 2, 3, and 4). Neulasta on day 9 to decrease risk of infection.    Plan:  1. Febrile neutropenia.  --Blood cultures NGTD, CXR and UA negative, patient empirically on cefepime.  --Patient given neulasta as noted above on 02/09/2013.  --Await count recovery.    2. Increased forgetfulness and/or confusion. --Could be delirium secondary to medications.  We will monitor her neurological status closely.  She had no neurological deficits on her exam.   3. 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  --She does not need dexamethasone daily as apart of her chemotherapy. I would discontinue this as it could be contributing to #2.   4. Pancytopenia as noted #1 secondary to chemotherapy.  She has likely approached her nadir and will likely recover in the next few days. .  --Supportive packed RBC and platelets transfusions prn. Neulasta as noted above.   5. History of atrial fibrillation: Diltiazem, pacemaker. Coumadin being held secondary to #3.   6. Hypothyroidism: Levothyroxine per primary team   7. Hypertension: Diltiazem per primary team   8. Diabetes mellitus, type II: SSI per primary team.   9.  Creatinine clearance. Avoiding nephrotoxins. Adjusting medications based on CC.  10. Disposition: Full Code. PT/OT as tolerated. I spoke the family at the bedside.  I also spoke with the primary team.  Please keep until she has signs of count recovery.   LOS:5 days   Ketan Renz, MD 02/17/2013  1:16 PM

## 2013-02-17 NOTE — Progress Notes (Signed)
I have reviewed this note and agree with all findings. [name], PT, DPT Pager: [phone]   

## 2013-02-18 LAB — CBC WITH DIFFERENTIAL/PLATELET
Basophils Absolute: 0 10*3/uL (ref 0.0–0.1)
Eosinophils Absolute: 0 10*3/uL (ref 0.0–0.7)
HCT: 25.9 % — ABNORMAL LOW (ref 36.0–46.0)
Lymphocytes Relative: 18 % (ref 12–46)
MCV: 89.6 fL (ref 78.0–100.0)
Monocytes Absolute: 0.4 10*3/uL (ref 0.1–1.0)
Monocytes Relative: 12 % (ref 3–12)
RBC: 2.89 MIL/uL — ABNORMAL LOW (ref 3.87–5.11)
WBC: 3.2 10*3/uL — ABNORMAL LOW (ref 4.0–10.5)

## 2013-02-18 LAB — BASIC METABOLIC PANEL
CO2: 26 mEq/L (ref 19–32)
Chloride: 97 mEq/L (ref 96–112)
Glucose, Bld: 210 mg/dL — ABNORMAL HIGH (ref 70–99)
Sodium: 130 mEq/L — ABNORMAL LOW (ref 135–145)

## 2013-02-18 LAB — GLUCOSE, CAPILLARY
Glucose-Capillary: 186 mg/dL — ABNORMAL HIGH (ref 70–99)
Glucose-Capillary: 229 mg/dL — ABNORMAL HIGH (ref 70–99)

## 2013-02-18 LAB — CULTURE, BLOOD (ROUTINE X 2): Culture: NO GROWTH

## 2013-02-18 MED ORDER — LEVOFLOXACIN 500 MG PO TABS
500.0000 mg | ORAL_TABLET | Freq: Every day | ORAL | Status: AC
  Administered 2013-02-18 – 2013-02-20 (×3): 500 mg via ORAL
  Filled 2013-02-18 (×3): qty 1

## 2013-02-18 MED ORDER — INSULIN GLARGINE 100 UNIT/ML ~~LOC~~ SOLN
20.0000 [IU] | Freq: Every day | SUBCUTANEOUS | Status: DC
  Administered 2013-02-18 – 2013-02-21 (×4): 20 [IU] via SUBCUTANEOUS
  Filled 2013-02-18 (×5): qty 0.2

## 2013-02-18 MED ORDER — LIDOCAINE-PRILOCAINE 2.5-2.5 % EX CREA
TOPICAL_CREAM | Freq: Once | CUTANEOUS | Status: AC
  Administered 2013-02-18: 15:00:00 via TOPICAL
  Filled 2013-02-18: qty 5

## 2013-02-18 MED ORDER — INSULIN ASPART 100 UNIT/ML ~~LOC~~ SOLN
10.0000 [IU] | Freq: Three times a day (TID) | SUBCUTANEOUS | Status: DC
  Administered 2013-02-18 – 2013-02-22 (×12): 10 [IU] via SUBCUTANEOUS

## 2013-02-18 NOTE — Progress Notes (Addendum)
Patient ID: [name], female   DOB: 1941-02-23, 72 y.o.   MRN: [id number]  [facility] PROGRESS NOTE  [name] DOB: 08/23/1940 DOA: 02/11/2013 PCP: Nadean Corwin, MD  Brief narrative:  72 year old female with past medical history of Non-Hodgkin lymphoma, atrial fibrillation (on coumadin), status post pacemaker placement for sick sinus rhythm, diabetes who presented to [facility] ED 02/11/2013 for fever of 100.4 F. Patietn was instructed by an oncologist to go to ED for evaluation.   In ED, vitals were stable with BP 96/58 which has improved to 114/67 with initial IV fluids. T max was 100.8 F and HR 55 - 94. Oxygen saturation was 96% on room air. CXR showed no active cardiopulmonary disease. CBC revealed WBC count of 2.4, hemoglobin of 10.3 and platelets of 30. BMP revealed sodium of 130 and creatinine of 1.31. Hospital course is complicated due to ongoing pancytopenia.   Assessment/Plan:  Principal Problem:  Fever and neutropenia  - secondary to sequela of chemotherapy  - Neupogen started 02/13/2013 but subsequently discontinued 02/16/2013 as per pharmacy recommendations the patient received Neulasta support on 02/09/2013  - will change ABX regimen to Levaquin PO as pt able to tolerate PO and WBC is within normal limits this AM  - stop Levaquin on 11/11 - Blood cultures to date show no growth, urine cultures show no growth  - pt remains afebrile over 72 hours  Active Problems:  Pancytopenia  - secondary to history of malignancy and sequela of chemotherapy  - Please note that for low platelet count patient has received one unit of platelets on 02/14/2013.  - For low white blood cell count patient received Neupogen starting 02/13/2013 but Neupogen is now discontinued, pt received Neulasta as noted above  - WBC trending up, no significant change in Plts or Hg over 24 hours  Essential hypertension, benign  - continue Cardizem 180 mg daily  - reasonable  inpatient control Atrial fibrillation  - on Cardizem  - Continue to hold Coumadin due to thrombocytopenia  Diffuse large B cell lymphoma  - Dr. Rosie Fate informed of patient's admission  - will d/c Decadron per oncology rec's  - allopurinol 300 mg daily  Hypothyroidism  - continue synthroid 50 mcg daily  Diabetes mellitus type 2  - A1c 5.9  - will continue to hold metformin due too acute renal failure on admission - will likely be abel to restart upon discharge  - continue SSI for now, change meal coverage Novolog to 10 units TID and Lantus to 20 units at bedtime  Hyperlipidemia  - continue statin therapy  Depression  - continue celexa  Acute renal failure  - likely pre-renal etiology, dehydration  - creatinine is within normal limits this AM  - BMP in AM  Code Status: full code  Family Communication: no family at the bedside  Disposition Plan: remains inpatient, possible d/c Monday    Consultants:  Oncology (Dr. Rosie Fate) Procedures:  None  Antibiotics:  Cefepime 02/12/2013 --> 11/08 Levaquin 11/08 --> 11/11  HPI/Subjective: No events overnight.   Objective: Filed Vitals:   02/17/13 0535 02/17/13 1415 02/17/13 2109 02/18/13 0529  BP: 126/68 127/70 125/70 119/72  Pulse: 70 72 74 70  Temp: 98.3 F (36.8 C) 98.3 F (36.8 C) 98.4 F (36.9 C) 98.1 F (36.7 C)  TempSrc: Oral Oral Oral Oral  Resp: 18 18 18 18   Height:      Weight: 97.6 kg (215 lb 2.7 oz)     SpO2:  97% 100% 98% 97%    Intake/Output Summary (Last 24 hours) at 02/18/13 0855 Last data filed at 02/18/13 9604  Gross per 24 hour  Intake    990 ml  Output   3075 ml  Net  -2085 ml    Exam:   General:  Pt is alert, follows commands appropriately, not in acute distress  Cardiovascular: Regular rate and rhythm, S1/S2, no murmurs, no rubs, no gallops  Respiratory: Clear to auscultation bilaterally, no wheezing, no crackles, no rhonchi  Abdomen: Soft, non tender, non distended, bowel sounds present, no  guarding  Extremities: No edema, pulses DP and PT palpable bilaterally  Neuro: Grossly nonfocal  Data Reviewed: Basic Metabolic Panel:  Recent Labs Lab 02/14/13 0509 02/15/13 0515 02/16/13 0511 02/17/13 0400 02/18/13 0612  NA 134* 133* 131* 132* 130*  K 4.4 4.5 4.3 4.1 4.5  CL 100 98 98 98 97  CO2 25 25 23 25 26   GLUCOSE 237* 268* 179* 162* 210*  BUN 33* 33* 39* 43* 40*  CREATININE 1.18* 1.09 1.12* 1.17* 0.94  CALCIUM 9.7 10.0 9.8 9.9 8.9   Liver Function Tests:  Recent Labs Lab 02/11/13 2333  AST 11  ALT 19  ALKPHOS 49  BILITOT 0.5  PROT 6.3  ALBUMIN 3.2*    Recent Labs Lab 02/11/13 2333  LIPASE 38  CBC:  Recent Labs Lab 02/13/13 0920 02/14/13 0509 02/15/13 0515 02/17/13 1053 02/18/13 0612  WBC 0.5* 0.3* 0.3* 0.9* 3.2*  NEUTROABS  --  0.0* 0.0*  --  2.2  HGB 10.3* 9.3* 8.5* 8.6* 9.3*  HCT 29.2* 26.0* 23.8* 24.0* 25.9*  MCV 93.9 92.9 91.9 89.6 89.6  PLT 15* 11* 27* 12* 10*   CBG:  Recent Labs Lab 02/16/13 2109 02/17/13 0727 02/17/13 1647 02/17/13 2114 02/18/13 0726  GLUCAP 223* 154* 267* 221* 186*    Recent Results (from the past 240 hour(s))  CULTURE, BLOOD (ROUTINE X 2)     Status: None   Collection Time    02/11/13 11:33 PM      Result Value Range Status   Specimen Description BLOOD LEFT ANTECUBITAL   Final   Special Requests BOTTLES DRAWN AEROBIC AND ANAEROBIC 5CC   Final   Culture  Setup Time     Final   Value: 02/12/2013 13:51     Performed at [facility]   Culture     Final   Value:        BLOOD CULTURE RECEIVED NO GROWTH TO DATE CULTURE WILL BE HELD FOR 5 DAYS BEFORE ISSUING A FINAL NEGATIVE REPORT     Performed at [facility]   Report Status PENDING   Incomplete  CULTURE, BLOOD (ROUTINE X 2)     Status: None   Collection Time    02/11/13 11:49 PM      Result Value Range Status   Specimen Description BLOOD RIGHT ANTECUBITAL   Final   Special Requests BOTTLES DRAWN AEROBIC AND ANAEROBIC 10CC   Final    Culture  Setup Time     Final   Value: 02/12/2013 13:51     Performed at [facility]   Culture     Final   Value:        BLOOD CULTURE RECEIVED NO GROWTH TO DATE CULTURE WILL BE HELD FOR 5 DAYS BEFORE ISSUING A FINAL NEGATIVE REPORT     Performed at [facility]   Report Status PENDING   Incomplete  URINE CULTURE  Status: None   Collection Time    02/12/13 12:11 AM      Result Value Range Status   Specimen Description URINE, RANDOM   Final   Special Requests Normal   Final   Culture  Setup Time     Final   Value: 02/12/2013 18:14     Performed at [facility]     Final   Value: NO GROWTH     Performed at [facility]   Culture     Final   Value: NO GROWTH     Performed at [facility]   Report Status 02/13/2013 FINAL   Final     Scheduled Meds: . acetaminophen  650 mg Oral Once  . allopurinol  300 mg Oral Daily  . ceFEPime (MAXIPIME) IV  2 g Intravenous Q12H  . citalopram  20 mg Oral q morning - 10a  . diltiazem  180 mg Oral q morning - 10a  . diphenhydrAMINE  25 mg Oral Once  . docusate sodium  100 mg Oral BID  . insulin aspart  0-20 Units Subcutaneous TID WC  . insulin aspart  0-5 Units Subcutaneous QHS  . insulin aspart  20 Units Subcutaneous TID WC  . insulin glargine  30 Units Subcutaneous QHS  . levothyroxine  50 mcg Oral QAC breakfast  . magnesium oxide  200 mg Oral Daily  . potassium chloride  20 mEq Oral Daily  . pravastatin  40 mg Oral QHS  . sodium chloride  3 mL Intravenous Q12H   Continuous Infusions:    Debbora Presto, MD  [facility] Pager [phone]  If 7PM-7AM, please contact night-coverage [email or web address] Password [email or web address] 02/18/2013, 8:55 AM   LOS: 7 days

## 2013-02-19 LAB — CBC WITH DIFFERENTIAL/PLATELET
Basophils Absolute: 0.1 10*3/uL (ref 0.0–0.1)
Basophils Relative: 1 % (ref 0–1)
Eosinophils Relative: 0 % (ref 0–5)
Hemoglobin: 9.5 g/dL — ABNORMAL LOW (ref 12.0–15.0)
Lymphs Abs: 0.8 10*3/uL (ref 0.7–4.0)
MCH: 32.8 pg (ref 26.0–34.0)
MCHC: 36.4 g/dL — ABNORMAL HIGH (ref 30.0–36.0)
MCV: 90 fL (ref 78.0–100.0)
Monocytes Absolute: 1.2 10*3/uL — ABNORMAL HIGH (ref 0.1–1.0)
Monocytes Relative: 11 % (ref 3–12)
Neutrophils Relative %: 80 % — ABNORMAL HIGH (ref 43–77)
Platelets: 11 10*3/uL — CL (ref 150–400)
RBC: 2.9 MIL/uL — ABNORMAL LOW (ref 3.87–5.11)
RDW: 15.2 % (ref 11.5–15.5)
WBC Morphology: INCREASED
WBC: 10.6 10*3/uL — ABNORMAL HIGH (ref 4.0–10.5)

## 2013-02-19 LAB — BASIC METABOLIC PANEL
CO2: 27 mEq/L (ref 19–32)
Calcium: 9.1 mg/dL (ref 8.4–10.5)
Glucose, Bld: 156 mg/dL — ABNORMAL HIGH (ref 70–99)
Potassium: 4.5 mEq/L (ref 3.5–5.1)
Sodium: 130 mEq/L — ABNORMAL LOW (ref 135–145)

## 2013-02-19 LAB — GLUCOSE, CAPILLARY: Glucose-Capillary: 88 mg/dL (ref 70–99)

## 2013-02-19 NOTE — Progress Notes (Signed)
Patient ID: JOELL BUERGER, female   DOB: [birth year], 72 y.o.   MRN: [id number] [facility] PROGRESS NOTE  Lanitra Battaglini Cronin WUJ:[id number] DOB: 1940/08/22 DOA: 02/11/2013 PCP: Nadean Corwin, MD  Brief narrative:  72 year old female with past medical history of Non-Hodgkin lymphoma, atrial fibrillation (on coumadin), status post pacemaker placement for sick sinus rhythm, diabetes who presented to [facility] ED 02/11/2013 for fever of 100.4 F. Patietn was instructed by an oncologist to go to ED for evaluation.   In ED, vitals were stable with BP 96/58 which has improved to 114/67 with initial IV fluids. T max was 100.8 F and HR 55 - 94. Oxygen saturation was 96% on room air. CXR showed no active cardiopulmonary disease. CBC revealed WBC count of 2.4, hemoglobin of 10.3 and platelets of 30. BMP revealed sodium of 130 and creatinine of 1.31. Hospital course is complicated due to ongoing pancytopenia.   Assessment/Plan:  Principal Problem:  Fever and neutropenia  - secondary to sequela of chemotherapy  - Neupogen started 02/13/2013 but subsequently discontinued 02/16/2013 as per pharmacy recommendations the patient received Neulasta support on 02/09/2013  - will change ABX regimen to Levaquin PO as pt able to tolerate PO and WBC is within normal limits this AM  - stop Levaquin on 11/11  - Blood cultures to date show no growth, urine cultures show no growth  - pt remains afebrile over 72 hours  Active Problems:  Pancytopenia  - secondary to history of malignancy and sequela of chemotherapy  - Please note that for low platelet count patient has received one unit of platelets on 02/14/2013.  - For low white blood cell count patient received Neupogen starting 02/13/2013 but Neupogen is now discontinued, pt received Neulasta as noted above  - WBC trending up, no significant change in Plts or Hg over 24 hours  Essential hypertension, benign  - continue Cardizem 180 mg daily  - reasonable inpatient  control  Atrial fibrillation  - on Cardizem  - Continue to hold Coumadin due to thrombocytopenia  Diffuse large B cell lymphoma  - Dr. Rosie Fate informed of patient's admission  - d/c Decadron per oncology rec's  - allopurinol 300 mg daily  Hypothyroidism  - continue synthroid 50 mcg daily  Diabetes mellitus type 2  - A1c 5.9  - will continue to hold metformin due too acute renal failure on admission  - will likely be abel to restart upon discharge  - continue SSI for now, changed meal coverage Novolog to 10 units TID and Lantus to 20 units at bedtime  Hyperlipidemia  - continue statin therapy  Depression  - continue celexa  Acute renal failure  - likely pre-renal etiology, dehydration  - creatinine is within normal limits this AM  - BMP in AM   Code Status: full code  Family Communication: no family at the bedside  Disposition Plan: remains inpatient, possible d/c Monday if oncology agrees   Consultants:  Oncology (Dr. Rosie Fate) Procedures:  None  Antibiotics:  Cefepime 02/12/2013 --> 11/08  Levaquin 11/08 --> 11/11  HPI/Subjective: No events overnight.   Objective: Filed Vitals:   02/18/13 0529 02/18/13 1415 02/18/13 2053 02/19/13 0527  BP: 119/72 113/55 111/57 112/60  Pulse: 70 72 71 72  Temp: 98.1 F (36.7 C) 97.4 F (36.3 C) 98.1 F (36.7 C) 98.2 F (36.8 C)  TempSrc: Oral Oral Oral Oral  Resp: 18 18 18 18   Height:      Weight:    97.3 kg (214  lb 8.1 oz)  SpO2: 97% 100% 98% 98%    Intake/Output Summary (Last 24 hours) at 02/19/13 1610 Last data filed at 02/19/13 9604  Gross per 24 hour  Intake    360 ml  Output   3750 ml  Net  -3390 ml    Exam:   General:  Pt is alert, follows commands appropriately, not in acute distress  Cardiovascular: Regular rate and rhythm, S1/S2, no murmurs, no rubs, no gallops  Respiratory: Clear to auscultation bilaterally, no wheezing, no crackles, no rhonchi  Abdomen: Soft, non tender, non distended, bowel sounds  present, no guarding  Extremities: No edema, pulses DP and PT palpable bilaterally  Data Reviewed: Basic Metabolic Panel:  Recent Labs Lab 02/15/13 0515 02/16/13 0511 02/17/13 0400 02/18/13 0612 02/19/13 0346  NA 133* 131* 132* 130* 130*  K 4.5 4.3 4.1 4.5 4.5  CL 98 98 98 97 96  CO2 25 23 25 26 27   GLUCOSE 268* 179* 162* 210* 156*  BUN 33* 39* 43* 40* 36*  CREATININE 1.09 1.12* 1.17* 0.94 0.95  CALCIUM 10.0 9.8 9.9 8.9 9.1   CBC:  Recent Labs Lab 02/14/13 0509 02/15/13 0515 02/17/13 1053 02/18/13 0612 02/19/13 0346  WBC 0.3* 0.3* 0.9* 3.2* 10.6*  NEUTROABS 0.0* 0.0*  --  2.2 8.5*  HGB 9.3* 8.5* 8.6* 9.3* 9.5*  HCT 26.0* 23.8* 24.0* 25.9* 26.1*  MCV 92.9 91.9 89.6 89.6 90.0  PLT 11* 27* 12* 10* 11*   CBG:  Recent Labs Lab 02/17/13 2114 02/18/13 0726 02/18/13 1259 02/18/13 1708 02/18/13 2051  GLUCAP 221* 186* 136* 302* 229*    Recent Results (from the past 240 hour(s))  CULTURE, BLOOD (ROUTINE X 2)     Status: None   Collection Time    02/11/13 11:33 PM      Result Value Range Status   Specimen Description BLOOD LEFT ANTECUBITAL   Final   Special Requests BOTTLES DRAWN AEROBIC AND ANAEROBIC 5CC   Final   Culture  Setup Time     Final   Value: 02/12/2013 13:51     Performed at [facility]   Culture     Final   Value: NO GROWTH 5 DAYS     Performed at [facility]   Report Status 02/18/2013 FINAL   Final  CULTURE, BLOOD (ROUTINE X 2)     Status: None   Collection Time    02/11/13 11:49 PM      Result Value Range Status   Specimen Description BLOOD RIGHT ANTECUBITAL   Final   Special Requests BOTTLES DRAWN AEROBIC AND ANAEROBIC 10CC   Final   Culture  Setup Time     Final   Value: 02/12/2013 13:51     Performed at [facility]   Culture     Final   Value: NO GROWTH 5 DAYS     Performed at [facility]   Report Status 02/18/2013 FINAL   Final  URINE CULTURE     Status: None   Collection Time    02/12/13 12:11  AM      Result Value Range Status   Specimen Description URINE, RANDOM   Final   Special Requests Normal   Final   Culture  Setup Time     Final   Value: 02/12/2013 18:14     Performed at [facility] Count     Final   Value: NO GROWTH     Performed  at [facility]     Final   Value: NO GROWTH     Performed at [facility]   Report Status 02/13/2013 FINAL   Final     Scheduled Meds: . acetaminophen  650 mg Oral Once  . allopurinol  300 mg Oral Daily  . citalopram  20 mg Oral q morning - 10a  . diltiazem  180 mg Oral q morning - 10a  . diphenhydrAMINE  25 mg Oral Once  . docusate sodium  100 mg Oral BID  . insulin aspart  0-20 Units Subcutaneous TID WC  . insulin aspart  0-5 Units Subcutaneous QHS  . insulin aspart  10 Units Subcutaneous TID WC  . insulin glargine  20 Units Subcutaneous QHS  . levofloxacin  500 mg Oral Daily  . levothyroxine  50 mcg Oral QAC breakfast  . magnesium oxide  200 mg Oral Daily  . potassium chloride  20 mEq Oral Daily  . pravastatin  40 mg Oral QHS  . sodium chloride  3 mL Intravenous Q12H   Continuous Infusions:    Debbora Presto, MD  [facility] Pager [phone]  If 7PM-7AM, please contact night-coverage [email or web address] Password [email or web address] 02/19/2013, 6:23 AM   LOS: 8 days

## 2013-02-20 ENCOUNTER — Other Ambulatory Visit: Payer: Self-pay | Admitting: Internal Medicine

## 2013-02-20 DIAGNOSIS — I4891 Unspecified atrial fibrillation: Secondary | ICD-10-CM

## 2013-02-20 DIAGNOSIS — D696 Thrombocytopenia, unspecified: Secondary | ICD-10-CM

## 2013-02-20 LAB — BASIC METABOLIC PANEL
BUN: 32 mg/dL — ABNORMAL HIGH (ref 6–23)
CO2: 28 mEq/L (ref 19–32)
GFR calc non Af Amer: 54 mL/min — ABNORMAL LOW (ref >90)
Glucose, Bld: 118 mg/dL — ABNORMAL HIGH (ref 70–99)
Potassium: 4.4 mEq/L (ref 3.5–5.1)
Sodium: 134 mEq/L — ABNORMAL LOW (ref 135–145)

## 2013-02-20 LAB — CBC
HCT: 27 % — ABNORMAL LOW (ref 36.0–46.0)
Hemoglobin: 9.6 g/dL — ABNORMAL LOW (ref 12.0–15.0)
MCH: 32.3 pg (ref 26.0–34.0)
MCHC: 35.6 g/dL (ref 30.0–36.0)
MCV: 90.9 fL (ref 78.0–100.0)
RBC: 2.97 MIL/uL — ABNORMAL LOW (ref 3.87–5.11)

## 2013-02-20 LAB — GLUCOSE, CAPILLARY
Glucose-Capillary: 193 mg/dL — ABNORMAL HIGH (ref 70–99)
Glucose-Capillary: 113 mg/dL — ABNORMAL HIGH (ref 70–99)
Glucose-Capillary: 224 mg/dL — ABNORMAL HIGH (ref 70–99)

## 2013-02-20 MED ORDER — POTASSIUM CHLORIDE CRYS ER 20 MEQ PO TBCR
20.0000 meq | EXTENDED_RELEASE_TABLET | Freq: Every day | ORAL | Status: DC
  Administered 2013-02-20 – 2013-02-22 (×3): 20 meq via ORAL
  Filled 2013-02-20 (×3): qty 1

## 2013-02-20 MED ORDER — SODIUM CHLORIDE 0.9 % IJ SOLN
10.0000 mL | INTRAMUSCULAR | Status: DC | PRN
  Administered 2013-02-20: 19:00:00 10 mL

## 2013-02-20 MED ORDER — SODIUM CHLORIDE 0.9 % IJ SOLN
10.0000 mL | Freq: Two times a day (BID) | INTRAMUSCULAR | Status: DC
  Administered 2013-02-20 – 2013-02-22 (×4): 10 mL

## 2013-02-20 NOTE — Progress Notes (Signed)
[name]   DOB:Aug 13, 1940   ZO#:[id number]   JWJ#:[id number]  Subjective: Patient sitting in chair without complaints.   She reports no hematochezia of epitaxis.  Her cough is much improved.  She ambulates with assistance and denies any fevers or chills. She still has a non-productive cough.    Objective:  Filed Vitals:   02/20/13 0540  BP: 101/60  Pulse: 69  Temp: 98.2 F (36.8 C)  Resp: 18    Body mass index is 48.22 kg/(m^2).  Intake/Output Summary (Last 24 hours) at 02/20/13 0835 Last data filed at 02/20/13 0750  Gross per 24 hour  Intake    600 ml  Output   2300 ml  Net  -1700 ml     Sclerae unicteric  Oropharynx clear  No peripheral adenopathy  Lungs clear -- no rales or rhonchi  Heart regular rate and rhythm  Abdomen benign  MSK no focal spinal tenderness, no peripheral edema  Neuro nonfocal   Recent Labs  02/19/13 1732 02/19/13 2128 02/20/13 0728  GLUCAP 95 105* 84     Labs:  Lab Results  Component Value Date   WBC 14.8* 02/20/2013   HGB 9.6* 02/20/2013   HCT 27.0* 02/20/2013   MCV 90.9 02/20/2013   PLT 9* 02/20/2013   NEUTROABS 8.5* 02/19/2013    Basic Metabolic Panel:  Recent Labs Lab 02/16/13 0511 02/17/13 0400 02/18/13 0612 02/19/13 0346 02/20/13 0410  NA 131* 132* 130* 130* 134*  K 4.3 4.1 4.5 4.5 4.4  CL 98 98 97 96 99  CO2 23 25 26 27 28   GLUCOSE 179* 162* 210* 156* 118*  BUN 39* 43* 40* 36* 32*  CREATININE 1.12* 1.17* 0.94 0.95 1.02  CALCIUM 9.8 9.9 8.9 9.1 9.1   CBC:  Recent Labs Lab 02/14/13 0509 02/15/13 0515 02/17/13 1053 02/18/13 0612 02/19/13 0346 02/20/13 0410  WBC 0.3* 0.3* 0.9* 3.2* 10.6* 14.8*  NEUTROABS 0.0* 0.0*  --  2.2 8.5*  --   HGB 9.3* 8.5* 8.6* 9.3* 9.5* 9.6*  HCT 26.0* 23.8* 24.0* 25.9* 26.1* 27.0*  MCV 92.9 91.9 89.6 89.6 90.0 90.9  PLT 11* 27* 12* 10* 11* 9*   CBG:  Recent Labs Lab 02/19/13 0719 02/19/13 1211 02/19/13 1732 02/19/13 2128 02/20/13 0728  GLUCAP 129* 88 95 105* 84    Microbiology Recent Results (from the past 240 hour(s))  CULTURE, BLOOD (ROUTINE X 2)     Status: None   Collection Time    02/11/13 11:33 PM      Result Value Range Status   Specimen Description BLOOD LEFT ANTECUBITAL   Final   Special Requests BOTTLES DRAWN AEROBIC AND ANAEROBIC 5CC   Final   Culture  Setup Time     Final   Value: 02/12/2013 13:51     Performed at [facility]   Culture     Final   Value: NO GROWTH 5 DAYS     Performed at [facility]   Report Status 02/18/2013 FINAL   Final  CULTURE, BLOOD (ROUTINE X 2)     Status: None   Collection Time    02/11/13 11:49 PM      Result Value Range Status   Specimen Description BLOOD RIGHT ANTECUBITAL   Final   Special Requests BOTTLES DRAWN AEROBIC AND ANAEROBIC 10CC   Final   Culture  Setup Time     Final   Value: 02/12/2013 13:51     Performed at [facility]  Final   Value: NO GROWTH 5 DAYS     Performed at [facility]   Report Status 02/18/2013 FINAL   Final  URINE CULTURE     Status: None   Collection Time    02/12/13 12:11 AM      Result Value Range Status   Specimen Description URINE, RANDOM   Final   Special Requests Normal   Final   Culture  Setup Time     Final   Value: 02/12/2013 18:14     Performed at [facility] Count     Final   Value: NO GROWTH     Performed at [facility]   Culture     Final   Value: NO GROWTH     Performed at [facility]   Report Status 02/13/2013 FINAL   Final   Studies:  No results found.  Assessment: 72 y.o. female with a history of Lymphoma, non-Hodgkin's, R mesentery started salvage chemotherapy with R-GPD on 10/26/12 on q 3week cycles. Her last dose was on 02/01/2013 following 4 cycles. She received neupogen on day# 9 (02/09/2013). She was admitted on 02/12/2013 secondary febrile neutropenia.   Her R-GPD is (Rituxan day1; Gemzar day 1 and 8; Cisplatin day 1; and Dexamethasone IV day 1 and  oral days 2, 3, and 4). Neulasta on day 9 to decrease risk of infection.   Plan:  1. Febrile neutropenia.  --Blood cultures NGTD, CXR and UA negative, patient empirically on cefepime.  Now on levaquin.  Complete a total of 10-14 day course (started abx on 11/02, should end on 11/16).  --WBC has recovered.  Plt less than 10,000.  I would transfuse one unit prior to discharge given her prior history of falls.   Should have counts repeated in our office on 11/14.   --Fall precautions counseling.   2. 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  --F/u on 11/18 to consider dose-modified R-GPD.   3. Pancytopenia as noted #1 secondary to chemotherapy.  --Supportive packed RBC and platelets transfusions prn. Neulasta as noted above. WBC has recovered.  Plt transfusion prior to discharge.   4. History of atrial fibrillation: Diltiazem, pacemaker. Coumadin being held secondary to #3.  Recheck labs with INR on 11/14.    5. Hypothyroidism: Levothyroxine per primary team  6. Hypertension: Diltiazem per primary team  7. Diabetes mellitus, type II: SSI per primary team.  8. Creatinine clearance. Avoiding nephrotoxins. Adjusting medications based on CC.  9. Disposition: Full Code. PT/OT as tolerated. I spoke the family at the bedside. I also spoke with the primary team. OK to discharge with plt transfusion prior.  Labs with my office on 11/14 to ensure count stability.  F/u with me on 11/18.   LOS:8 days     [name], MD 02/20/2013  8:35 AM

## 2013-02-20 NOTE — Progress Notes (Signed)
Physical Therapy Treatment Patient Details Name: [name] MRN: [id number] DOB: 1941/04/08 Today's Date: 02/20/2013 Time: 4098-1191 PT Time Calculation (min): 15 min  PT Assessment / Plan / Recommendation  History of Present Illness Pt is a 72 y.o. female who presents to the ED with fever.  Fever is occuring in the setting of chemotherapy just 2 days ago for DLBCL.  The patient states that before chemo she did have URI like symptoms but went ahead with chemotherapy anyhow.  After developing a fever of 100.4 degrees today at home she called her oncologist as she was instructed to.  Oncology instructed her to present to the ED.  She has had a mild cough, non productive, but not really any other symptoms at this time.   PT Comments   *Pt progressing with mobility, walked 84' with RW and min A. Performed BLE exercises. **  Follow Up Recommendations  Home health PT     Does the patient have the potential to tolerate intense rehabilitation     Barriers to Discharge        Equipment Recommendations  None recommended by PT    Recommendations for Other Services    Frequency Min 3X/week   Progress towards PT Goals Progress towards PT goals: Progressing toward goals  Plan Current plan remains appropriate    Precautions / Restrictions Precautions Precautions: Fall Restrictions Weight Bearing Restrictions: No   Pertinent Vitals/Pain *HR 122 with walking SaO2 99% on RA with walking Pt reported unrated B knee pain with walking, RN notified**    Mobility  Transfers Transfers: Sit to Stand;Stand to Sit Sit to Stand: 4: Min assist;With upper extremity assist;From chair/3-in-1 Stand to Sit: 4: Min guard;With upper extremity assist;With armrests;To chair/3-in-1 Details for Transfer Assistance: assist to rise. VC's for hand placement. Ambulation/Gait Ambulation/Gait Assistance: 4: Min guard Ambulation Distance (Feet): 44 Feet Assistive device: Rolling walker Ambulation/Gait  Assistance Details: min guard for safety, VCs to step into frame of RW, distance limited by B knee pain (pt stated, "I knee a knee replacement".) Gait Pattern: Step-through pattern;Decreased stride length;Wide base of support Gait velocity: decreased General Gait Details: steady, no LOB    Exercises General Exercises - Lower Extremity Ankle Circles/Pumps: AROM;20 reps;Seated;Both (rest breaks between all exercises due to fatigue) Heel Slides: AROM;Supine;Both;15 reps Hip ABduction/ADduction: AROM;Supine;Both;15 reps   PT Diagnosis:    PT Problem List:   PT Treatment Interventions:     PT Goals (current goals can now be found in the care plan section) Acute Rehab PT Goals Patient Stated Goal: to go home PT Goal Formulation: With patient Time For Goal Achievement: 03/01/13 Potential to Achieve Goals: Good  Visit Information  Last PT Received On: 02/20/13 Assistance Needed: +1 History of Present Illness: Pt is a 72 y.o. female who presents to the ED with fever.  Fever is occuring in the setting of chemotherapy just 2 days ago for DLBCL.  The patient states that before chemo she did have URI like symptoms but went ahead with chemotherapy anyhow.  After developing a fever of 100.4 degrees today at home she called her oncologist as she was instructed to.  Oncology instructed her to present to the ED.  She has had a mild cough, non productive, but not really any other symptoms at this time.    Subjective Data  Patient Stated Goal: to go home   Cognition  Cognition Arousal/Alertness: Awake/alert Behavior During Therapy: WFL for tasks assessed/performed Overall Cognitive Status: Within Functional Limits for tasks assessed (  pt has difficulty with short-term memory and word finding)    Balance     End of Session PT - End of Session Equipment Utilized During Treatment: Gait belt Activity Tolerance: Patient limited by fatigue;Patient limited by pain Patient left: in chair;with call  bell/phone within reach Nurse Communication: Mobility status   GP     Ralene Bathe Kistler 02/20/2013, 1:33 PM [phone]

## 2013-02-20 NOTE — Progress Notes (Signed)
[facility] PROGRESS NOTE  [name] ZOX:[id number] DOB: June 24, 1940 DOA: 02/11/2013 PCP: Nadean Corwin, MD  Assessment/Plan: Febrile neutropenia.  --Blood cultures NGTD, CXR and UA negative, patient empirically on cefepime.  --Patient given neulasta as noted above on 02/09/2013. Discussed with pharmacy the utility of providing additional neupogen given Neupogen is long-acting. H/D stable. Will DC Neupogen -Await WBC count recovery. If blood cultures are negative, ANC currently inadequate continue antibiotic coverage (complete ten-day course. ) -Spoke with Dr Rosie Fate [employer]) today, and he agrees we should transfuse 2 unit Platelets. Pt can then be D/Ced with labs to be drawn on 11/14 and followup appointment with Dr. Baltazar Apo on 11/18.   2. 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  --Prior chemotherapy dose adjusted with 50% of cisplatin given due to reduced creatinine clearance, gemcitabine reduced by 25% and Rituximab at full dose. Was given dexamethasone 40 mg daily on days #2-4 (10/23 -10/24) which likely contributed to her hyperglycemia.    3. Pancytopenia as noted #1 secondary to chemotherapy.  She should be approaching her expected nadir.  --Supportive packed RBC and platelets transfusions prn.  -DCed Neupogen as noted above.    4. History of atrial fibrillation: Diltiazem, pacemaker. Coumadin being held secondary to #3.   5. Hypothyroidism: Continue Levothyroxine 50 mcg daily    6. Hypertension: Continue Diltiazem 180 mg daily    7. Diabetes mellitus, type II: Increase Lantus to 20 units daily (uncontrolled secondary to steroids) continue NovoLog q. a.c. at 10 units , Continue resistant SSI   8. Creatinine clearance. Creatinine continues to improve ;Avoid nephrotoxins. Adjust medications based on CC.   Hyponatremia; patient has become slightly hyponatremic but resolving  Disposition: Full Code. PT/OT as tolerated.    Code Status:  Full Family Communication: Disposition Plan: DC in the a.m. after completion of platelets transfusion  Consultants:  Oncology  Procedures:    Antibiotics:  Cefepime 11/2>>> stopped 11/8  Levofloxacin 11/8 for 3 doses     HPI/Subjective: 72 yo WF PMHx  Non-Hodgkin lymphoma, atrial fibrillation (on coumadin), status post pacemaker placement for sick sinus rhythm, diabetes who presented to [facility] ED 02/11/2013 for fever of 100.4 F. Patietn was instructed by an oncologist to go to ED for evaluation.  Vitals were stable in ED with BP 96/58 which has improved to 114/67 with initial IV fluids. T max was 100.8 F and HR 55 - 94. Oxygen saturation was 96% on room air. CXR showed no active cardiopulmonary disease. CBC revealed WBC count of 2.4, hemoglobin of 10.3 and platelets of 30. BMP revealed sodium of 130 and creatinine of 1.31. 02/15/2013 patient comfortably in chair consuming a meal no complaints. TODAY patient sitting in chair comfortably, states ready for discharge to her son's house    Objective: Filed Vitals:   02/19/13 0913 02/19/13 1359 02/19/13 2117 02/20/13 0540  BP: 119/47 120/49 97/41 101/60  Pulse:  85 66 69  Temp:  98 F (36.7 C) 98.1 F (36.7 C) 98.2 F (36.8 C)  TempSrc:  Oral Oral Oral  Resp:  18 18 18   Height:      Weight:    97.5 kg (214 lb 15.2 oz)  SpO2:  98% 98% 96%    Intake/Output Summary (Last 24 hours) at 02/20/13 0802 Last data filed at 02/20/13 0654  Gross per 24 hour  Intake    480 ml  Output   2300 ml  Net  -1820 ml   Filed Weights   02/17/13 0535 02/19/13  1610 02/20/13 0540  Weight: 97.6 kg (215 lb 2.7 oz) 97.3 kg (214 lb 8.1 oz) 97.5 kg (214 lb 15.2 oz)    Exam:   General: A./O. x4, NAD  Cardiovascular: Regular rhythm and rate, negative murmurs rubs gallops, DP/PT pulse +1 bilateral  Respiratory: Clear to auscultation bilateral (continues to have  raspy cough)  Abdomen: Obese, nontender, nondistended, plus bowel  sounds  Musculoskeletal: Negative pedal edema bilateral    Data Reviewed: Basic Metabolic Panel:  Recent Labs Lab 02/16/13 0511 02/17/13 0400 02/18/13 0612 02/19/13 0346 02/20/13 0410  NA 131* 132* 130* 130* 134*  K 4.3 4.1 4.5 4.5 4.4  CL 98 98 97 96 99  CO2 23 25 26 27 28   GLUCOSE 179* 162* 210* 156* 118*  BUN 39* 43* 40* 36* 32*  CREATININE 1.12* 1.17* 0.94 0.95 1.02  CALCIUM 9.8 9.9 8.9 9.1 9.1   Liver Function Tests: No results found for this basename: AST, ALT, ALKPHOS, BILITOT, PROT, ALBUMIN,  in the last 168 hours No results found for this basename: LIPASE, AMYLASE,  in the last 168 hours No results found for this basename: AMMONIA,  in the last 168 hours CBC:  Recent Labs Lab 02/14/13 0509 02/15/13 0515 02/17/13 1053 02/18/13 0612 02/19/13 0346 02/20/13 0410  WBC 0.3* 0.3* 0.9* 3.2* 10.6* 14.8*  NEUTROABS 0.0* 0.0*  --  2.2 8.5*  --   HGB 9.3* 8.5* 8.6* 9.3* 9.5* 9.6*  HCT 26.0* 23.8* 24.0* 25.9* 26.1* 27.0*  MCV 92.9 91.9 89.6 89.6 90.0 90.9  PLT 11* 27* 12* 10* 11* 9*   Cardiac Enzymes: No results found for this basename: CKTOTAL, CKMB, CKMBINDEX, TROPONINI,  in the last 168 hours BNP (last 3 results) No results found for this basename: PROBNP,  in the last 8760 hours CBG:  Recent Labs Lab 02/19/13 0719 02/19/13 1211 02/19/13 1732 02/19/13 2128 02/20/13 0728  GLUCAP 129* 88 95 105* 84    Recent Results (from the past 240 hour(s))  CULTURE, BLOOD (ROUTINE X 2)     Status: None   Collection Time    02/11/13 11:33 PM      Result Value Range Status   Specimen Description BLOOD LEFT ANTECUBITAL   Final   Special Requests BOTTLES DRAWN AEROBIC AND ANAEROBIC 5CC   Final   Culture  Setup Time     Final   Value: 02/12/2013 13:51     Performed at [facility]   Culture     Final   Value: NO GROWTH 5 DAYS     Performed at [facility]   Report Status 02/18/2013 FINAL   Final  CULTURE, BLOOD (ROUTINE X 2)     Status: None    Collection Time    02/11/13 11:49 PM      Result Value Range Status   Specimen Description BLOOD RIGHT ANTECUBITAL   Final   Special Requests BOTTLES DRAWN AEROBIC AND ANAEROBIC 10CC   Final   Culture  Setup Time     Final   Value: 02/12/2013 13:51     Performed at [facility]   Culture     Final   Value: NO GROWTH 5 DAYS     Performed at [facility]   Report Status 02/18/2013 FINAL   Final  URINE CULTURE     Status: None   Collection Time    02/12/13 12:11 AM      Result Value Range Status   Specimen Description URINE, RANDOM  Final   Special Requests Normal   Final   Culture  Setup Time     Final   Value: 02/12/2013 18:14     Performed at [facility] Count     Final   Value: NO GROWTH     Performed at [facility]   Culture     Final   Value: NO GROWTH     Performed at [facility]   Report Status 02/13/2013 FINAL   Final     Studies: No results found.  Scheduled Meds: . acetaminophen  650 mg Oral Once  . allopurinol  300 mg Oral Daily  . citalopram  20 mg Oral q morning - 10a  . diltiazem  180 mg Oral q morning - 10a  . diphenhydrAMINE  25 mg Oral Once  . docusate sodium  100 mg Oral BID  . insulin aspart  0-20 Units Subcutaneous TID WC  . insulin aspart  0-5 Units Subcutaneous QHS  . insulin aspart  10 Units Subcutaneous TID WC  . insulin glargine  20 Units Subcutaneous QHS  . levofloxacin  500 mg Oral Daily  . levothyroxine  50 mcg Oral QAC breakfast  . magnesium oxide  200 mg Oral Daily  . potassium chloride  20 mEq Oral Daily  . pravastatin  40 mg Oral QHS  . sodium chloride  3 mL Intravenous Q12H   Continuous Infusions:   Principal Problem:   Fever and neutropenia Active Problems:   Essential hypertension, benign   Atrial fibrillation   Cardiac pacemaker in situ   Diffuse large B cell lymphoma   Hypothyroidism   Diabetes mellitus type 2 in obese   Hyperlipidemia   Non-Hodgkin's lymphoma of  abdomen   Depression   Antineoplastic chemotherapy induced pancytopenia   Acute renal failure   Anemia of chronic disease    Time spent: 40 minutes    Dalary Hollar, J  [facility] Pager [phone]. If 7PM-7AM, please contact night-coverage at [email or web address], password [facility] 02/20/2013, 8:02 AM  LOS: 9 days

## 2013-02-21 ENCOUNTER — Telehealth: Payer: Self-pay | Admitting: Internal Medicine

## 2013-02-21 ENCOUNTER — Other Ambulatory Visit: Payer: Medicare Other | Admitting: Lab

## 2013-02-21 ENCOUNTER — Ambulatory Visit: Payer: Medicare Other

## 2013-02-21 LAB — PREPARE PLATELET PHERESIS
Unit division: 0
Unit division: 0

## 2013-02-21 LAB — BASIC METABOLIC PANEL
BUN: 23 mg/dL (ref 6–23)
CO2: 29 mEq/L (ref 19–32)
Calcium: 9.4 mg/dL (ref 8.4–10.5)
Chloride: 99 mEq/L (ref 96–112)
Creatinine, Ser: 0.95 mg/dL (ref 0.50–1.10)
GFR calc Af Amer: 68 mL/min — ABNORMAL LOW (ref >90)
GFR calc non Af Amer: 58 mL/min — ABNORMAL LOW (ref >90)
Glucose, Bld: 126 mg/dL — ABNORMAL HIGH (ref 70–99)
Potassium: 4.1 mEq/L (ref 3.5–5.1)
Sodium: 137 mEq/L (ref 135–145)

## 2013-02-21 LAB — CBC
Hemoglobin: 10.5 g/dL — ABNORMAL LOW (ref 12.0–15.0)
MCH: 32.4 pg (ref 26.0–34.0)
MCHC: 34.8 g/dL (ref 30.0–36.0)
RDW: 15.7 % — ABNORMAL HIGH (ref 11.5–15.5)
WBC: 18.7 10*3/uL — ABNORMAL HIGH (ref 4.0–10.5)

## 2013-02-21 LAB — GLUCOSE, CAPILLARY
Glucose-Capillary: 131 mg/dL — ABNORMAL HIGH (ref 70–99)
Glucose-Capillary: 181 mg/dL — ABNORMAL HIGH (ref 70–99)
Glucose-Capillary: 160 mg/dL — ABNORMAL HIGH (ref 70–99)
Glucose-Capillary: 202 mg/dL — ABNORMAL HIGH (ref 70–99)

## 2013-02-21 MED ORDER — MAGNESIUM OXIDE 400 (241.3 MG) MG PO TABS: 200.0000 mg | ORAL_TABLET | Freq: Every day | ORAL | Status: AC

## 2013-02-21 MED ORDER — INSULIN ASPART 100 UNIT/ML ~~LOC~~ SOLN: 10.0000 [IU] | Freq: Three times a day (TID) | SUBCUTANEOUS | Status: DC

## 2013-02-21 MED ORDER — HEPARIN SOD (PORK) LOCK FLUSH 100 UNIT/ML IV SOLN
500.0000 [IU] | INTRAVENOUS | Status: AC | PRN
  Administered 2013-02-22: 500 [IU]
  Filled 2013-02-21: qty 5

## 2013-02-21 MED ORDER — DSS 100 MG PO CAPS: 100.0000 mg | ORAL_CAPSULE | Freq: Two times a day (BID) | ORAL | Status: DC

## 2013-02-21 MED ORDER — INSULIN GLARGINE 100 UNIT/ML ~~LOC~~ SOLN: 20.0000 [IU] | Freq: Every day | SUBCUTANEOUS | Status: DC

## 2013-02-21 MED ORDER — LEVOTHYROXINE SODIUM 50 MCG PO TABS: 50.0000 ug | ORAL_TABLET | Freq: Every day | ORAL | Status: DC

## 2013-02-21 NOTE — Discharge Summary (Addendum)
Physician Discharge Summary  Tajia Szeliga Rosano JXB:[id number] DOB: 1940-08-26 DOA: 02/11/2013  PCP: Nadean Corwin, MD  Admit date: 02/11/2013 Discharge date: 02/21/2013  Time spent: 50 minutes  Recommendations for Outpatient Follow-up:  Febrile neutropenia.  --Blood cultures NGTD, CXR and UA negative, patient empirically on cefepime.  --Patient given neulasta as noted above on 02/09/2013. Discussed with pharmacy the utility of providing additional neupogen given Neupogen is long-acting. H/D stable. Will DC Neupogen  -Await WBC count recovery. If blood cultures are negative, ANC currently inadequate continue antibiotic coverage (complete ten-day course. )  - 11/10/2014Spoke with Dr Rosie Fate (Oncologist) today, and he agrees we should transfuse 2 unit Platelets. Pt can then be D/Ced with labs to be drawn on 11/14 and followup appointment with Dr. Baltazar Apo on 11/18.  - Platlets sucessfully transfused  NOTE; patient to discharged on 11/12 secondary to problems arranging transportation  2. 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  --Prior chemotherapy dose adjusted with 50% of cisplatin given due to reduced creatinine clearance, gemcitabine reduced by 25% and Rituximab at full dose. Was given dexamethasone 40 mg daily on days #2-4 (10/23 -10/24) which likely contributed to her hyperglycemia.  -dexamethasone stopped on 02/17/2013   3. Pancytopenia as noted #1 secondary to chemotherapy.  She should be approaching her expected nadir.  --Supportive packed RBC and platelets transfusions prn.  -DCed Neupogen as noted above.   4. History of atrial fibrillation: Diltiazem, pacemaker. Platelets at 102 (low) so will cont. Hold Coumadin   5. Hypothyroidism: Continue Levothyroxine 50 mcg daily   6. Hypertension: Continue Diltiazem 180 mg daily   7. Diabetes mellitus, type II: Increase Lantus to 20 units daily (uncontrolled secondary to steroids) continue NovoLog q. a.c. at 10 units.     8. Creatinine clearance.  WNL@ D/C ;cont. Avoid nephrotoxins. Adjust medications based on CC.   Hyponatremia; Resolved   Discharge Diagnoses:  Principal Problem:   Fever and neutropenia Active Problems:   Essential hypertension, benign   Atrial fibrillation   Cardiac pacemaker in situ   Diffuse large B cell lymphoma   Hypothyroidism   Diabetes mellitus type 2 in obese   Hyperlipidemia   Non-Hodgkin's lymphoma of abdomen   Depression   Antineoplastic chemotherapy induced pancytopenia   Acute renal failure   Anemia of chronic disease   Discharge Condition: Stable  Diet recommendation: Heart Healthy  Filed Weights   02/17/13 0535 02/19/13 0527 02/20/13 0540  Weight: 97.6 kg (215 lb 2.7 oz) 97.3 kg (214 lb 8.1 oz) 97.5 kg (214 lb 15.2 oz)    History of present illness:  72 yo WF PMHx Non-Hodgkin lymphoma, atrial fibrillation (on coumadin), status post pacemaker placement for sick sinus rhythm, diabetes who presented [facility] ED 02/11/2013 for fever of 100.4 F. Patietn was instructed by an oncologist to go to ED for evaluation.  Vitals were stable in ED with BP 96/58 which has improved to 114/67 with initial IV fluids. T max was 100.8 F and HR 55 - 94. Oxygen saturation was 96% on room air. CXR showed no active cardiopulmonary disease. CBC revealed WBC count of 2.4, hemoglobin of 10.3 and platelets of 30. BMP revealed sodium of 130 and creatinine of 1.31. 02/15/2013 patient comfortably in chair consuming a meal no complaints. 02/20/2013 patient sitting in chair comfortably, states ready for discharge to her son's house. TODAY Pt states has worked out living arrangements w/ Son and is ready for D/C   Consultants:  Oncology (Dr. Rosie Fate) Procedures:  Antibiotics:  Cefepime 11/2>>> stopped 11/8  Levofloxacin 11/8 for 3 doses    Discharge Exam: Filed Vitals:   02/20/13 2020 02/20/13 2114 02/21/13 0620 02/21/13 1430  BP:  106/62 100/63 100/50  Pulse:  88 96 98  Temp:  98 F  (36.7 C) 98.4 F (36.9 C) 98.7 F (37.1 C)  TempSrc:  Oral Oral Oral  Resp: 18 20 20 20   Height:      Weight:      SpO2:  98% 100% 98%   General: A./O. x4, NAD  Cardiovascular: Regular rhythm and rate, negative murmurs rubs gallops, DP/PT pulse +1 bilateral  Respiratory: Clear to auscultation bilateral (continues to have raspy productive cough/clear)  Abdomen: Obese, nontender, nondistended, plus bowel sounds  Musculoskeletal: Negative pedal edema bilateral     Discharge Instructions       Future Appointments Provider Department Dept Phone   02/24/2013 9:00 AM [facility] [phone]   02/27/2013 9:15 AM [facility] [place] [facility] [phone]   02/28/2013 9:30 AM [facility] [place] [phone]   02/28/2013 10:00 AM [facility] Covering Provider [facility] [phone]       Medication List    ASK your doctor about these medications       albuterol (2.5 MG/3ML) 0.083% nebulizer solution  Commonly known as:  PROVENTIL  Take 2.5 mg by nebulization every 6 (six) hours as needed for shortness of breath.     allopurinol 300 MG tablet  Commonly known as:  ZYLOPRIM  Take 300 mg by mouth daily.     citalopram 20 MG tablet  Commonly known as:  CELEXA  Take 20 mg by mouth every morning.     dexamethasone 4 MG tablet  Commonly known as:  DECADRON  Take 10 tablets (40 mg total) by mouth daily.     diltiazem 180 MG 24 hr capsule  Commonly known as:  CARDIZEM CD  Take 180 mg by mouth every morning.     levothyroxine 100 MCG tablet  Commonly known as:  SYNTHROID, LEVOTHROID  Take 50 mcg by mouth every morning.     lidocaine-prilocaine cream  Commonly known as:  EMLA  Apply topically as needed. Apply to port a cath site one hour before needle stick as needed.     loperamide 2 MG capsule  Commonly known as:   IMODIUM  Take 2 mg by mouth 4 (four) times daily as needed for diarrhea or loose stools.     magnesium oxide 400 (241.3 MG) MG tablet  Commonly known as:  MAG-OX  Take 200 mg by mouth daily.     metFORMIN 500 MG 24 hr tablet  Commonly known as:  GLUCOPHAGE-XR  Take 1,000 mg by mouth 2 (two) times daily.     ondansetron 8 MG tablet  Commonly known as:  ZOFRAN  Take 1 tablet (8 mg total) by mouth every 12 (twelve) hours as needed for nausea.     potassium chloride 10 MEQ tablet  Commonly known as:  K-DUR  Take 20 mEq by mouth daily. Take 2 tablets my mouth daily     pravastatin 40 MG tablet  Commonly known as:  PRAVACHOL  Take 40 mg by mouth at bedtime.     prochlorperazine 10 MG tablet  Commonly known as:  COMPAZINE  Take 10 mg by mouth every 6 (six) hours as needed (nausea).  sulfamethoxazole-trimethoprim 800-160 MG per tablet  Commonly known as:  BACTRIM DS  Take 1 tablet by mouth 2 (two) times daily.     Vitamin D3 2000 UNITS Tabs  Take 2,000 Units by mouth 2 (two) times daily.     warfarin 6 MG tablet  Commonly known as:  COUMADIN  Take 3-6 mg by mouth daily. 3mg  T,Th, Sat,Sun and 6mg  MWF       Allergies  Allergen Reactions  . Codeine Nausea Only      The results of significant diagnostics from this hospitalization (including imaging, microbiology, ancillary and laboratory) are listed below for reference.    Significant Diagnostic Studies: Dg Chest 2 View  02/12/2013   CLINICAL DATA:  Fever.  EXAM: CHEST  2 VIEW  COMPARISON:  01/10/2013  FINDINGS: There is mild cardiomegaly, stable from prior. Dual-chamber left approach pacer, leads in unchanged position. Right porta catheter, tip in stable position. No acute infiltrate, edema, effusion, or pneumothorax.  IMPRESSION: No active cardiopulmonary disease.   Electronically Signed   By: Tiburcio Pea M.D.   On: 02/12/2013 00:47    Microbiology: Recent Results (from the past 240 hour(s))  CULTURE, BLOOD  (ROUTINE X 2)     Status: None   Collection Time    02/11/13 11:33 PM      Result Value Range Status   Specimen Description BLOOD LEFT ANTECUBITAL   Final   Special Requests BOTTLES DRAWN AEROBIC AND ANAEROBIC 5CC   Final   Culture  Setup Time     Final   Value: 02/12/2013 13:51     Performed at [facility]   Culture     Final   Value: NO GROWTH 5 DAYS     Performed at [facility]   Report Status 02/18/2013 FINAL   Final  CULTURE, BLOOD (ROUTINE X 2)     Status: None   Collection Time    02/11/13 11:49 PM      Result Value Range Status   Specimen Description BLOOD RIGHT ANTECUBITAL   Final   Special Requests BOTTLES DRAWN AEROBIC AND ANAEROBIC 10CC   Final   Culture  Setup Time     Final   Value: 02/12/2013 13:51     Performed at [facility]   Culture     Final   Value: NO GROWTH 5 DAYS     Performed at [facility]   Report Status 02/18/2013 FINAL   Final  URINE CULTURE     Status: None   Collection Time    02/12/13 12:11 AM      Result Value Range Status   Specimen Description URINE, RANDOM   Final   Special Requests Normal   Final   Culture  Setup Time     Final   Value: 02/12/2013 18:14     Performed at [facility] Count     Final   Value: NO GROWTH     Performed at [facility]   Culture     Final   Value: NO GROWTH     Performed at [facility]   Report Status 02/13/2013 FINAL   Final     Labs: Basic Metabolic Panel:  Recent Labs Lab 02/17/13 0400 02/18/13 0612 02/19/13 0346 02/20/13 0410 02/21/13 0455  NA 132* 130* 130* 134* 137  K 4.1 4.5 4.5 4.4 4.1  CL 98 97 96 99 99  CO2 25 26 27 28 29   GLUCOSE  162* 210* 156* 118* 126*  BUN 43* 40* 36* 32* 23  CREATININE 1.17* 0.94 0.95 1.02 0.95  CALCIUM 9.9 8.9 9.1 9.1 9.4   Liver Function Tests: No results found for this basename: AST, ALT, ALKPHOS, BILITOT, PROT, ALBUMIN,  in the last 168 hours No results found for this basename:  LIPASE, AMYLASE,  in the last 168 hours No results found for this basename: AMMONIA,  in the last 168 hours CBC:  Recent Labs Lab 02/15/13 0515 02/17/13 1053 02/18/13 0612 02/19/13 0346 02/20/13 0410 02/21/13 1700  WBC 0.3* 0.9* 3.2* 10.6* 14.8* 18.7*  NEUTROABS 0.0*  --  2.2 8.5*  --   --   HGB 8.5* 8.6* 9.3* 9.5* 9.6* 10.5*  HCT 23.8* 24.0* 25.9* 26.1* 27.0* 30.2*  MCV 91.9 89.6 89.6 90.0 90.9 93.2  PLT 27* 12* 10* 11* 9* 102*   Cardiac Enzymes: No results found for this basename: CKTOTAL, CKMB, CKMBINDEX, TROPONINI,  in the last 168 hours BNP: BNP (last 3 results) No results found for this basename: PROBNP,  in the last 8760 hours CBG:  Recent Labs Lab 02/20/13 1142 02/20/13 1735 02/20/13 2226 02/21/13 0809 02/21/13 1153  GLUCAP 193* 113* 224* 131* 181*       Signed:  Carolyne Littles, MD [facility] [phone] pager

## 2013-02-21 NOTE — Telephone Encounter (Signed)
s.w. pt and advised on all appts...pt ok and awre °

## 2013-02-22 LAB — GLUCOSE, CAPILLARY: Glucose-Capillary: 201 mg/dL — ABNORMAL HIGH (ref 70–99)

## 2013-02-22 LAB — BASIC METABOLIC PANEL
Calcium: 9.5 mg/dL (ref 8.4–10.5)
GFR calc Af Amer: 54 mL/min — ABNORMAL LOW (ref >90)
GFR calc non Af Amer: 46 mL/min — ABNORMAL LOW (ref >90)
Potassium: 4.1 mEq/L (ref 3.5–5.1)
Sodium: 137 mEq/L (ref 135–145)

## 2013-02-22 NOTE — Progress Notes (Signed)
Pt discharged home with daughter in law in stable condition. Discharge instructions given. Scripts sent to patient pharmacy of choice. Pt and daughter in law verbalized understanding.

## 2013-02-22 NOTE — Progress Notes (Signed)
[name] DOB [birth year]  Patient was seen and examined at the bedside. Patient was stable for discharge 02/20/2013. Please refer to discharge summary done 02/20/2013.  Addendum to discharge:  Patient reported not wanting to take insulin. I have reviewed the records from the time of the admission and it seems that patient was always on metformin. A1c was 5.9 this admission indicating good glycemic control. Patient does not need to take insulin at this time and can continue taking metformin as before  Manson Passey [facility] [phone]

## 2013-02-24 ENCOUNTER — Other Ambulatory Visit: Payer: Medicare Other

## 2013-02-27 ENCOUNTER — Ambulatory Visit: Payer: Self-pay

## 2013-02-28 ENCOUNTER — Ambulatory Visit (HOSPITAL_BASED_OUTPATIENT_CLINIC_OR_DEPARTMENT_OTHER): Payer: Medicare Other | Admitting: Internal Medicine

## 2013-02-28 ENCOUNTER — Telehealth: Payer: Self-pay | Admitting: Internal Medicine

## 2013-02-28 ENCOUNTER — Other Ambulatory Visit (HOSPITAL_BASED_OUTPATIENT_CLINIC_OR_DEPARTMENT_OTHER): Payer: Medicare Other | Admitting: Lab

## 2013-02-28 VITALS — BP 127/72 | HR 107 | Temp 97.1°F | Resp 20 | Ht <= 58 in | Wt 211.1 lb

## 2013-02-28 DIAGNOSIS — E039 Hypothyroidism, unspecified: Secondary | ICD-10-CM

## 2013-02-28 DIAGNOSIS — J42 Unspecified chronic bronchitis: Secondary | ICD-10-CM | POA: Insufficient documentation

## 2013-02-28 DIAGNOSIS — D638 Anemia in other chronic diseases classified elsewhere: Secondary | ICD-10-CM

## 2013-02-28 DIAGNOSIS — D696 Thrombocytopenia, unspecified: Secondary | ICD-10-CM

## 2013-02-28 DIAGNOSIS — C8583 Other specified types of non-Hodgkin lymphoma, intra-abdominal lymph nodes: Secondary | ICD-10-CM

## 2013-02-28 DIAGNOSIS — D61810 Antineoplastic chemotherapy induced pancytopenia: Secondary | ICD-10-CM

## 2013-02-28 DIAGNOSIS — I4891 Unspecified atrial fibrillation: Secondary | ICD-10-CM

## 2013-02-28 DIAGNOSIS — C8590 Non-Hodgkin lymphoma, unspecified, unspecified site: Secondary | ICD-10-CM

## 2013-02-28 DIAGNOSIS — C8593 Non-Hodgkin lymphoma, unspecified, intra-abdominal lymph nodes: Secondary | ICD-10-CM

## 2013-02-28 DIAGNOSIS — E119 Type 2 diabetes mellitus without complications: Secondary | ICD-10-CM

## 2013-02-28 DIAGNOSIS — Z7901 Long term (current) use of anticoagulants: Secondary | ICD-10-CM

## 2013-02-28 LAB — LACTATE DEHYDROGENASE (CC13): LDH: 333 U/L — ABNORMAL HIGH (ref 125–245)

## 2013-02-28 LAB — COMPREHENSIVE METABOLIC PANEL (CC13)
ALT: 15 U/L (ref 0–55)
AST: 17 U/L (ref 5–34)
Albumin: 3.6 g/dL (ref 3.5–5.0)
Alkaline Phosphatase: 81 U/L (ref 40–150)
Anion Gap: 12 mEq/L — ABNORMAL HIGH (ref 3–11)
Chloride: 104 mEq/L (ref 98–109)
Glucose: 129 mg/dl (ref 70–140)
Potassium: 4.3 mEq/L (ref 3.5–5.1)
Sodium: 140 mEq/L (ref 136–145)
Total Bilirubin: 0.3 mg/dL (ref 0.20–1.20)
Total Protein: 6.6 g/dL (ref 6.4–8.3)

## 2013-02-28 LAB — CBC WITH DIFFERENTIAL/PLATELET
BASO%: 0.5 % (ref 0.0–2.0)
Basophils Absolute: 0 10*3/uL (ref 0.0–0.1)
EOS%: 0.3 % (ref 0.0–7.0)
Eosinophils Absolute: 0 10*3/uL (ref 0.0–0.5)
HCT: 28.7 % — ABNORMAL LOW (ref 34.8–46.6)
HGB: 9.3 g/dL — ABNORMAL LOW (ref 11.6–15.9)
MCH: 31.7 pg (ref 25.1–34.0)
NEUT%: 71.1 % (ref 38.4–76.8)
Platelets: 98 10*3/uL — ABNORMAL LOW (ref 145–400)
RDW: 17.3 % — ABNORMAL HIGH (ref 11.2–14.5)
lymph#: 1.1 10*3/uL (ref 0.9–3.3)

## 2013-02-28 LAB — PROTIME-INR: INR: 1 — ABNORMAL LOW (ref 2.00–3.50)

## 2013-02-28 NOTE — Telephone Encounter (Signed)
not able to reach pt or lm re 11/25 appt. schedule mailed. per 11/18 pof lb/fu next wk and chemo is counts ok. lm for desk to confirm what chemo pt is tatking.

## 2013-02-28 NOTE — Progress Notes (Signed)
[facility] OFFICE PROGRESS NOTE  MCKEOWN,WILLIAM Maddux Vanscyoc, MD [address] [place] [address]  DIAGNOSIS: Anemia of chronic disease  Antineoplastic chemotherapy induced pancytopenia  Non-Hodgkin's lymphoma of abdomen - Plan: CBC with Differential, Comprehensive metabolic panel, POCT INR, Lactate dehydrogenase  Chronic bronchitis  Chief Complaint  Patient presents with  . 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   PAST THERAPY: q3wk R-CHOP x 6 cycles; finished in Dec 2012; followed by consolidative radiation therapy. She developed recurrent disease; started on bendamustine/Rituxan on 06/29/2012. She completed 3 cycles on 08/25/12. Stopped due to disease progression.   CURRENT THERAPY: Started salvage chemotherapy with R-GPD on 10/26/12 on q 3week cycles. Her last dose was on 02/09/2013 following 4 cycles. She received neupogen on day# 9.   INTERVAL HISTORY:  [name] 72 y.o. female returns for follow up visit. She was last seen by me on 01/31/2013. She had a 11 day hospitalization at [facility] (02/11/13 - 02/22/2013) secondary to febrile neutropenia.  Her infectious work-up was negative and her thrombocytopenia was prolonged.  She received antibiotics and plt transfusion.   Since discharge, she has been living with her son.  She reports continued non-productive cough and nasal congestion.  She denies headaches or visual changes.  She reports a long history of cough.  She is a former longstanding smoker who quit over the past year. Today, She is accompanied today with her family--her niece [name].   She denies  without fever or chills.    She denied headaches, double vision, blurry vision, nasal discharge, hearing problems, odynophagia or dysphagia. No chest pain, palpitations, dyspnea, cough, abdominal pain, vomiting, diarrhea, constipation, hematochezia. The patient denied dysuria,  nocturia, polyuria, hematuria, myalgia, numbness, tingling, psychiatric problems.  MEDICAL HISTORY: Past Medical History  Diagnosis Date  . Sick sinus syndrome   . Hematoma     At the site of the pacemaker insertion.  . Hypotension   . Dyslipidemia   . Diabetes mellitus   . Pacemaker   . History of atrial fibrillation   . Arthritis     Osteoarthritis  . Hypothyroidism   . Port-a-cath in place 12/08/10  . Status post chemotherapy completed 03/2011    R - CHOP q 3 weeks x 6  . S/P radiation therapy 05/28/2011 - 06/26/2011    Right Abdomen and Right Pelvis/3060 cGy in 17 Fractions  . Diffuse large B cell lymphoma   . Cancer 10/2010    large B cell lymphoma  . 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 Non-Hodgkin's lymphoma of abdomen 05/20/2011    INTERIM HISTORY: has Essential hypertension, benign; Atrial fibrillation; Cardiac pacemaker in situ; Diffuse large B cell lymphoma; Hypothyroidism; Diabetes mellitus type 2 in obese; Hyperlipidemia; Non-Hodgkin's lymphoma of abdomen; Depression; Fever and neutropenia; Antineoplastic chemotherapy induced pancytopenia; Acute renal failure; Anemia of chronic disease; and Chronic bronchitis on her problem list.    ALLERGIES:  is allergic to codeine.  MEDICATIONS: has a current medication list which includes the following prescription(s): albuterol, allopurinol, vitamin d3, citalopram, diltiazem, dss, levothyroxine, lidocaine-prilocaine, loperamide, magnesium oxide, metformin, ondansetron, potassium chloride, pravastatin, and prochlorperazine, and the following Facility-Administered Medications: sodium chloride.  SURGICAL HISTORY:  Past Surgical History  Procedure Laterality Date  . Pacemaker insertion      Lead revision completed August 04, 2006  . Cholecystectomy    . Tubal ligation      Bilateral  . Insert / replace / remove pacemaker    .  Abdominal hysterectomy    . Biopsy stomach  11/03/10    Soft Tissue Mass, Biopsy, Right Lower Quadrant  Mesenteric Mass- High Grade Non-Hodgkins B Cell Lymphoma  with Flow Cytometry  . Bone biopsy  11/24/10    Bone Marrow, Aspirate Biospy, and clot, Left - No Involvement of Non-Hodgkin's Lymphoma Identified    REVIEW OF SYSTEMS:   Constitutional: Denies fevers, chills or abnormal weight loss Eyes: Denies blurriness of vision Ears, nose, mouth, throat, and face: Denies mucositis or sore throat Respiratory: Denies cough, dyspnea or wheezes Cardiovascular: Denies palpitation, chest discomfort or lower extremity swelling Gastrointestinal:  Denies nausea, heartburn or change in bowel habits Skin: Denies abnormal skin rashes Lymphatics: Denies new lymphadenopathy or easy bruising Neurological:Denies numbness, tingling or new weaknesses Behavioral/Psych: Mood is stable, no new changes  All other systems were reviewed with the patient and are negative.  PHYSICAL EXAMINATION: ECOG PERFORMANCE STATUS: 2 - Symptomatic, <50% confined to bed  Blood pressure 127/72, pulse 107, temperature 97.1 F (36.2 C), resp. rate 20, height 4\' 8"  (1.422 m), weight 211 lb 1.6 oz (95.754 kg).  GENERAL:alert, no distress and comfortable;obese, sitting in wheelchair SKIN: skin color, texture, turgor are normal, no rashes or significant lesions EYES: normal, Conjunctiva are pink and non-injected, sclera clear OROPHARYNX:no exudate, no erythema and lips, buccal mucosa, and tongue normal  NECK: supple, thyroid normal size, non-tender, without nodularity LYMPH:  no palpable lymphadenopathy in the cervical, axillary or supraclavicular LUNGS: minimal wheezes with normal breathing effort HEART: regular rate & rhythm and no murmurs and no lower extremity edema ABDOMEN: obese, abdomen soft, non-tender and normal bowel sounds Musculoskeletal:no cyanosis of digits and no clubbing  NEURO: alert & oriented x 3 with fluent speech, no focal motor/sensory deficits   LABORATORY DATA: Results for orders placed in visit on  02/28/13 (from the past 48 hour(s))  COMPREHENSIVE METABOLIC PANEL (CC13)     Status: Abnormal   Collection Time    02/28/13  9:43 AM      Result Value Range   Sodium 140  136 - 145 mEq/L   Potassium 4.3  3.5 - 5.1 mEq/L   Chloride 104  98 - 109 mEq/L   CO2 25  22 - 29 mEq/L   Glucose 129  70 - 140 mg/dl   BUN 16.1  7.0 - 09.6 mg/dL   Creatinine 1.3 (*) 0.6 - 1.1 mg/dL   Total Bilirubin 0.45  0.20 - 1.20 mg/dL   Alkaline Phosphatase 81  40 - 150 U/L   AST 17  5 - 34 U/L   ALT 15  0 - 55 U/L   Total Protein 6.6  6.4 - 8.3 g/dL   Albumin 3.6  3.5 - 5.0 g/dL   Calcium 40.9  8.4 - 81.1 mg/dL   Anion Gap 12 (*) 3 - 11 mEq/L  LACTATE DEHYDROGENASE (CC13)     Status: Abnormal   Collection Time    02/28/13  9:44 AM      Result Value Range   LDH 333 (*) 125 - 245 U/L  CBC WITH DIFFERENTIAL     Status: Abnormal   Collection Time    02/28/13  9:44 AM      Result Value Range   WBC 7.3  3.9 - 10.3 10e3/uL   NEUT# 5.2  1.5 - 6.5 10e3/uL   HGB 9.3 (*) 11.6 - 15.9 g/dL   HCT 91.4 (*) 78.2 - 95.6 %   Platelets 98 (*) 145 - 400 10e3/uL  MCV 98.0  79.5 - 101.0 fL   MCH 31.7  25.1 - 34.0 pg   MCHC 32.4  31.5 - 36.0 g/dL   RBC 4.09 (*) 8.11 - 9.14 10e6/uL   RDW 17.3 (*) 11.2 - 14.5 %   lymph# 1.1  0.9 - 3.3 10e3/uL   MONO# 1.0 (*) 0.1 - 0.9 10e3/uL   Eosinophils Absolute 0.0  0.0 - 0.5 10e3/uL   Basophils Absolute 0.0  0.0 - 0.1 10e3/uL   NEUT% 71.1  38.4 - 76.8 %   LYMPH% 14.6  14.0 - 49.7 %   MONO% 13.5  0.0 - 14.0 %   EOS% 0.3  0.0 - 7.0 %   BASO% 0.5  0.0 - 2.0 %   nRBC 2 (*) 0 - 0 %  PROTIME-INR     Status: Abnormal   Collection Time    02/28/13  9:44 AM      Result Value Range   Protime 12.0  10.6 - 13.4 Seconds   INR 1.00 (*) 2.00 - 3.50   Comment: INR is useful only to assess adequacy of anticoagulation with coumadin when comparing results from different labs. It should not be used to estimate bleeding risk or presence/abscense of coagulopathy in patients not on coumadin.  Expected INR ranges for      nontherapeutic patients is 0.88 - 1.12.   Lovenox No      Labs:  Lab Results  Component Value Date   WBC 7.3 02/28/2013   HGB 9.3* 02/28/2013   HCT 28.7* 02/28/2013   MCV 98.0 02/28/2013   PLT 98* 02/28/2013   NEUTROABS 5.2 02/28/2013      Chemistry      Component Value Date/Time   NA 140 02/28/2013 0943   NA 137 02/22/2013 0435   NA 141 10/12/2011 0957   K 4.3 02/28/2013 0943   K 4.1 02/22/2013 0435   K 4.4 10/12/2011 0957   CL 98 02/22/2013 0435   CL 102 09/21/2012 0917   CL 95* 10/12/2011 0957   CO2 25 02/28/2013 0943   CO2 29 02/22/2013 0435   CO2 29 10/12/2011 0957   BUN 13.4 02/28/2013 0943   BUN 25* 02/22/2013 0435   BUN 15 10/12/2011 0957   CREATININE 1.3* 02/28/2013 0943   CREATININE 1.15* 02/22/2013 0435   CREATININE 0.9 10/12/2011 0957      Component Value Date/Time   CALCIUM 10.0 02/28/2013 0943   CALCIUM 9.5 02/22/2013 0435   CALCIUM 9.1 10/12/2011 0957   ALKPHOS 81 02/28/2013 0943   ALKPHOS 49 02/11/2013 2333   ALKPHOS 65 10/12/2011 0957   AST 17 02/28/2013 0943   AST 11 02/11/2013 2333   AST 19 10/12/2011 0957   ALT 15 02/28/2013 0943   ALT 19 02/11/2013 2333   ALT 20 10/12/2011 0957   BILITOT 0.30 02/28/2013 0943   BILITOT 0.5 02/11/2013 2333   BILITOT 0.60 10/12/2011 0957     Basic Metabolic Panel:  Recent Labs Lab 02/28/13 0943  NA 140  K 4.3  CO2 25  GLUCOSE 129  BUN 13.4  CREATININE 1.3*  CALCIUM 10.0   GFR Estimated Creatinine Clearance: 37.1 ml/min (by C-G formula based on Cr of 1.3). Liver Function Tests:  Recent Labs Lab 02/28/13 0943  AST 17  ALT 15  ALKPHOS 81  BILITOT 0.30  PROT 6.6  ALBUMIN 3.6   Coagulation profile  Recent Labs Lab 02/28/13 0944  INR 1.00*  PROTIME 12.0    CBC:  Recent Labs  Lab 02/28/13 0944  WBC 7.3  NEUTROABS 5.2  HGB 9.3*  HCT 28.7*  MCV 98.0  PLT 98*    RADIOGRAPHIC STUDIES: Dg Chest 2 View  01/10/2013   CLINICAL DATA:  Cough. Dyspnea.  EXAM: CHEST  2 VIEW   COMPARISON:  01/01/2013  FINDINGS: Power port and a dual lead pacer are in place. Heart size and vascularity are normal and the lungs are clear. No effusions. No significant osseous abnormality.  IMPRESSION: No active cardiopulmonary disease.   Electronically Signed   By: Geanie Cooley   On: 01/10/2013 10:23   Dg Chest 2 View  01/01/2013   CLINICAL DATA:  Chest pain and cough  EXAM: CHEST  2 VIEW  COMPARISON:  Chest radiograph April 05, 2011 and chest CT December 23, 2012  FINDINGS: Lungs are clear. Heart is upper normal in size with normal pulmonary vascularity. No adenopathy. Port-A-Cath tip is in the superior vena cava near the cavoatrial junction. Pacemaker leads are attached to the right atrium and middle cardiac vein. No pneumothorax. .  IMPRESSION: No edema or consolidation.   Electronically Signed   By: Bretta Bang   On: 01/01/2013 17:46    ASSESSMENT: [name] 72 y.o. female with a history of Anemia of chronic disease  Antineoplastic chemotherapy induced pancytopenia  Non-Hodgkin's lymphoma of abdomen - Plan: CBC with Differential, Comprehensive metabolic panel, POCT INR, Lactate dehydrogenase  Chronic bronchitis  PLAN:  1. Relapsed high grade non-hodgkin's B cell Lymphoma on salvage therapy   -Chemotherapy reference: Relapsed high grade non-Hodgkin's B cell lymphoma on salvage therapy with DGP Harrold Donath M et. Al, Cancer 2004) + Rituximab  - She completed 3 cycles of R+DGP q 21 days (modified doses based on dehydration, electrolyte abnormalities) and last visit we reviewed the CT Scans of C/A/P which was consistent with a mild  increase in retroperitoneal lymphadenopathy. She is still recovering from her prolonged hospitalization from neutropenia fever (11/01 -11/12) and has nasal congestion and chronic cough likely secondary to chronic bronchitis.  We will delay her chemotherapy until next Tuesday, November 25th.    We will continue her R+DGP for cycles # 5 as demonstrated  below:   Her Gemcitabine will be reduced further given her prolonged neutropenia.    Oncology Flowsheet 03/07/2013 (Day #1)  Day, Cycle Day 1, Cycle 4  CISplatin (PLATINOL) IV 37.5 mg/m2  (reduced by 50% due to creatinine clearance)  dexamethasone (DECADRON) IV 12 mg  diphenhydrAMINE (BENADRYL) PO 50 mg  fosaprepitant (EMEND) IV 150 mg  Gemcitabine HCl (GEMZAR) IV 500 mg/m2 (reduced by 50%)  palonosetron (ALOXI) IV 0.25 mg  RiTUXimab (RITUXAN) IV 375 mg/m2    Oncology Flowsheet 03/14/2013  (Day #8)  Day, Cycle Day 8, Cycle 4  diphenhydrAMINE (BENADRYL) PO 25 mg  Gemcitabine HCl (GEMZAR) IV 500 mg/m2 (reduced by 50%)  prochlorperazine (COMPAZINE) PO 10 mg   Oncology Flowsheet 03/15/2013  (Day #9)  Day, Cycle Day 9, Cycle 4  Pegfilgrastim (NEULASTA) [place] 6 mg   -Patient was also instructed to take dexamethasone 40 mg daily on days 2-4 (and prescription was e-prescribed and patient notified).  She will also receive neulasta based on her prior high risk of infection and prior episode of febrile neutropenia. Marland Kitchen    --We will repeat scans to assess response on 03/29/13.  Given her significant co-morbidities, I favor against bone marrow transplant.   Again, patient was counseled on the chemotherapy plan including its indications to control her disease and the risks  including but not limited to myelotoxicity which can result in life-threatening infections, nephrotoxicity, ototoxicity and neurotoxicity, nausea/vomiting, electrolyte abnormalities and fatigue.  She understood the benefits and risks and consented to proceed with therapy.   2.  INR.  -- Her INR is one today.  She has been off due to thrombocytopenia of less than 50K.   Her goal is between 2 and 3.   She denies any bleeding.  We will continue to monitor closely.  Coumadin being managed per her PCP.  3. Elevated creatinine.  --Creatinine is up to 1.3 slightly increased from 1.1 (Range 1.1 to 1.6 over the past one year). We encouraged  aggressive hydration and avoidance of nephrotoxins.   We will continue to closely monitor her labs weekly.   4. Anemia likely secondary to chronic illnesses and chemotherapy. -- Her hemoglobin is 9.3 today.  She is asymptomatic presently.     5. Hypothyroidism: Levothyroxine per PCP  6.  Hypertension: Diltiazem per PCP.   7. Diabetes mellitus, type II: Metformin per PCP.   8. Hyperlipidemia: Pravastatin per PCP.   9. History of atrial fibrillation: Diltiazem, pacemaker. Coumadin being managed per her PCP.  10. Follow-up in 3 weeks for consideration of cycle #6 of R-DGP.  We will obtain CBC, CMP.    All questions were answered. The patient knows to call the clinic with any problems, questions or concerns. We can certainly see the patient much sooner if necessary.  I spent 25 minutes counseling the patient face to face. The total time spent in the appointment was 40 minutes.    Loy Little, MD 03/01/2013 11:27 AM

## 2013-02-28 NOTE — Patient Instructions (Signed)
1. Return to clinic in one week for consideration of cycle #5 of chemotherapy. 2. Please restart coumadin.  It was held secondary to low plts.  3. Complete course of antibiotics. 4. Start albuterol every 6 hours as needed.  5. Please call MD if fever greater than 100.5.  6. We will obtain scans including CT of chest/abdomen and pelvis following next course of chemotherapy.

## 2013-03-01 ENCOUNTER — Other Ambulatory Visit: Payer: Self-pay | Admitting: Internal Medicine

## 2013-03-02 ENCOUNTER — Encounter: Payer: Self-pay | Admitting: Internal Medicine

## 2013-03-02 DIAGNOSIS — I10 Essential (primary) hypertension: Secondary | ICD-10-CM | POA: Insufficient documentation

## 2013-03-02 DIAGNOSIS — I2510 Atherosclerotic heart disease of native coronary artery without angina pectoris: Secondary | ICD-10-CM | POA: Insufficient documentation

## 2013-03-02 DIAGNOSIS — J45909 Unspecified asthma, uncomplicated: Secondary | ICD-10-CM | POA: Insufficient documentation

## 2013-03-02 DIAGNOSIS — J449 Chronic obstructive pulmonary disease, unspecified: Secondary | ICD-10-CM | POA: Insufficient documentation

## 2013-03-02 DIAGNOSIS — M109 Gout, unspecified: Secondary | ICD-10-CM | POA: Insufficient documentation

## 2013-03-03 ENCOUNTER — Ambulatory Visit: Payer: Medicare Other | Admitting: Emergency Medicine

## 2013-03-03 VITALS — BP 122/80 | HR 70 | Temp 98.6°F | Resp 18 | Wt 211.0 lb

## 2013-03-03 DIAGNOSIS — J309 Allergic rhinitis, unspecified: Secondary | ICD-10-CM

## 2013-03-03 DIAGNOSIS — R05 Cough: Secondary | ICD-10-CM

## 2013-03-03 DIAGNOSIS — I4891 Unspecified atrial fibrillation: Secondary | ICD-10-CM

## 2013-03-03 NOTE — Patient Instructions (Signed)
Cough, Adult  A cough is a reflex. It helps you clear your throat and airways. A cough can help heal your body. A cough can last 2 or 3 weeks (acute) or may last more than 8 weeks (chronic). Some common causes of a cough can include an infection, allergy, or a cold. HOME CARE  Only take medicine as told by your doctor.  If given, take your medicines (antibiotics) as told. Finish them even if you start to feel better.  Use a cold steam vaporizer or humidier in your home. This can help loosen thick spit (secretions).  Sleep so you are almost sitting up (semi-upright). Use pillows to do this. This helps reduce coughing.  Rest as needed.  Stop smoking if you smoke. GET HELP RIGHT AWAY IF:  You have yellowish-white fluid (pus) in your thick spit.  Your cough gets worse.  Your medicine does not reduce coughing, and you are losing sleep.  You cough up blood.  You have trouble breathing.  Your pain gets worse and medicine does not help.  You have a fever. MAKE SURE YOU:   Understand these instructions.  Will watch your condition.  Will get help right away if you are not doing well or get worse. Document Released: 12/11/2010 Document Revised: 06/22/2011 Document Reviewed: 12/11/2010 [facility] Patient Information 2014 [place], [place]. Allergic Rhinitis Allergic rhinitis is when the mucous membranes in the nose respond to allergens. Allergens are particles in the air that cause your body to have an allergic reaction. This causes you to release allergic antibodies. Through a chain of events, these eventually cause you to release histamine into the blood stream (hence the use of antihistamines). Although meant to be protective to the body, it is this release that causes your discomfort, such as frequent sneezing, congestion and an itchy runny nose.  CAUSES  The pollen allergens may come from grasses, trees, and weeds. This is seasonal allergic rhinitis, or "hay fever." Other allergens  cause year-round allergic rhinitis (perennial allergic rhinitis) such as house dust mite allergen, pet dander and mold spores.  SYMPTOMS   Nasal stuffiness (congestion).  Runny, itchy nose with sneezing and tearing of the eyes.  There is often an itching of the mouth, eyes and ears. It cannot be cured, but it can be controlled with medications. DIAGNOSIS  If you are unable to determine the offending allergen, skin or blood testing may find it. TREATMENT   Avoid the allergen.  Medications and allergy shots (immunotherapy) can help.  Hay fever may often be treated with antihistamines in pill or nasal spray forms. Antihistamines block the effects of histamine. There are over-the-counter medicines that may help with nasal congestion and swelling around the eyes. Check with your caregiver before taking or giving this medicine. If the treatment above does not work, there are many new medications your caregiver can prescribe. Stronger medications may be used if initial measures are ineffective. Desensitizing injections can be used if medications and avoidance fails. Desensitization is when a patient is given ongoing shots until the body becomes less sensitive to the allergen. Make sure you follow up with your caregiver if problems continue. SEEK MEDICAL CARE IF:   You develop fever (more than 100.5 F (38.1 C).  You develop a cough that does not stop easily (persistent).  You have shortness of breath.  You start wheezing.  Symptoms interfere with normal daily activities. Document Released: 12/23/2000 Document Revised: 06/22/2011 Document Reviewed: 07/04/2008 [facility] Patient Information 2014 [place], [place].

## 2013-03-05 ENCOUNTER — Encounter: Payer: Self-pay | Admitting: Emergency Medicine

## 2013-03-05 NOTE — Progress Notes (Signed)
Subjective:    Patient ID: [name], female    DOB: 02-01-1941, 72 y.o.   MRN: [id number]  HPI Comments: 72 yo female presents for Coumadin recheck. She was recently discharged from the hospital was admitted for neutropenia. She is feeling better and will start chemo on Tuesday. Her coumadin was stopped in the hospital because of high level. She has only restarted for 2 days. She had labs recently at Oncology and showed improvement from our last labs. She noticed increase white/ clear discharge from her nose, shed denies fever or sinus pain. She occasionally can cough up white drainage. She has not tried any OTC except Mucinex.   Current Outpatient Prescriptions on File Prior to Visit  Medication Sig Dispense Refill  . albuterol (PROVENTIL) (2.5 MG/3ML) 0.083% nebulizer solution Take 2.5 mg by nebulization every 6 (six) hours as needed for shortness of breath.       . allopurinol (ZYLOPRIM) 300 MG tablet Take 300 mg by mouth daily.      . Cholecalciferol (VITAMIN D3) 2000 UNITS TABS Take 2,000 Units by mouth 2 (two) times daily.       . citalopram (CELEXA) 20 MG tablet Take 20 mg by mouth every morning.       . diltiazem (CARDIZEM CD) 180 MG 24 hr capsule Take 180 mg by mouth every morning.       Marland Kitchen levothyroxine (SYNTHROID, LEVOTHROID) 50 MCG tablet Take 1 tablet (50 mcg total) by mouth daily before breakfast.  30 tablet  0  . lidocaine-prilocaine (EMLA) cream Apply topically as needed. Apply to port a cath site one hour before needle stick as needed.  30 g  2  . loperamide (IMODIUM) 2 MG capsule Take 2 mg by mouth 4 (four) times daily as needed for diarrhea or loose stools.      . magnesium oxide (MAG-OX) 400 (241.3 MG) MG tablet Take 0.5 tablets (200 mg total) by mouth daily.  30 tablet  0  . metFORMIN (GLUCOPHAGE-XR) 500 MG 24 hr tablet Take 1,000 mg by mouth 2 (two) times daily.      . ondansetron (ZOFRAN) 8 MG tablet Take 1 tablet (8 mg total) by mouth every 12 (twelve) hours as needed  for nausea.  20 tablet  3  . potassium chloride (K-DUR) 10 MEQ tablet Take 20 mEq by mouth daily. Take 2 tablets my mouth daily      . pravastatin (PRAVACHOL) 40 MG tablet Take 40 mg by mouth at bedtime.       . prochlorperazine (COMPAZINE) 10 MG tablet Take 10 mg by mouth every 6 (six) hours as needed (nausea).      . warfarin (COUMADIN) 6 MG tablet Take 6 mg by mouth daily. M,W,F then 2.5 mg Tues, Thurs, Sat and Sun      . docusate sodium 100 MG CAPS Take 100 mg by mouth 2 (two) times daily.  10 capsule  0   Current Facility-Administered Medications on File Prior to Visit  Medication Dose Route Frequency Provider Last Rate Last Dose  . sodium chloride 0.9 % injection 10 mL  10 mL Intracatheter PRN Exie Parody, MD   10 mL at 10/26/12 2000   ALLERGIES Codeine and Penicillins    Review of Systems  HENT: Positive for postnasal drip.   Respiratory: Positive for cough.   All other systems reviewed and are negative.   BP 122/80  Pulse 70  Temp(Src) 98.6 F (37 C) (Temporal)  Resp 18  Wt  211 lb (95.709 kg)     Objective:   Physical Exam  Nursing note and vitals reviewed. Constitutional: She appears well-developed and well-nourished.  HENT:  Head: Normocephalic and atraumatic.  Right Ear: External ear normal.  Left Ear: External ear normal.  Nose: Nose normal.  Mouth/Throat: Oropharynx is clear and moist.  Neck: Normal range of motion. No thyromegaly present.  Cardiovascular: Normal rate, normal heart sounds and intact distal pulses.   Chronic Afib  Pulmonary/Chest: Effort normal and breath sounds normal.  Congestion clears with cough  Lymphadenopathy:    She has no cervical adenopathy.  Neurological: She is alert.  Skin: Skin is warm and dry.  Psychiatric: She has a normal mood and affect. Judgment normal.          Assessment & Plan:  1. Afib -With recent restart of therapy advise recheck at chemo visit next week 2. Allergic rhinitis- Allegra OTC and continue Mucinex  AD, w/c if fever or production 3. OBESE- needs weight loss and daily chair exercises.

## 2013-03-07 ENCOUNTER — Ambulatory Visit (HOSPITAL_BASED_OUTPATIENT_CLINIC_OR_DEPARTMENT_OTHER): Payer: Medicare Other | Admitting: Internal Medicine

## 2013-03-07 ENCOUNTER — Ambulatory Visit (HOSPITAL_BASED_OUTPATIENT_CLINIC_OR_DEPARTMENT_OTHER): Payer: Medicare Other

## 2013-03-07 ENCOUNTER — Telehealth: Payer: Self-pay | Admitting: Internal Medicine

## 2013-03-07 ENCOUNTER — Other Ambulatory Visit (HOSPITAL_BASED_OUTPATIENT_CLINIC_OR_DEPARTMENT_OTHER): Payer: Medicare Other | Admitting: Lab

## 2013-03-07 VITALS — BP 122/71 | HR 102 | Temp 97.6°F | Resp 20 | Ht <= 58 in | Wt 214.2 lb

## 2013-03-07 VITALS — BP 107/69 | HR 85 | Temp 97.0°F

## 2013-03-07 DIAGNOSIS — C8593 Non-Hodgkin lymphoma, unspecified, intra-abdominal lymph nodes: Secondary | ICD-10-CM

## 2013-03-07 DIAGNOSIS — J449 Chronic obstructive pulmonary disease, unspecified: Secondary | ICD-10-CM

## 2013-03-07 DIAGNOSIS — C8330 Diffuse large B-cell lymphoma, unspecified site: Secondary | ICD-10-CM

## 2013-03-07 DIAGNOSIS — C8583 Other specified types of non-Hodgkin lymphoma, intra-abdominal lymph nodes: Secondary | ICD-10-CM

## 2013-03-07 DIAGNOSIS — Z7901 Long term (current) use of anticoagulants: Secondary | ICD-10-CM

## 2013-03-07 DIAGNOSIS — I4891 Unspecified atrial fibrillation: Secondary | ICD-10-CM

## 2013-03-07 DIAGNOSIS — D649 Anemia, unspecified: Secondary | ICD-10-CM

## 2013-03-07 DIAGNOSIS — J42 Unspecified chronic bronchitis: Secondary | ICD-10-CM

## 2013-03-07 DIAGNOSIS — R944 Abnormal results of kidney function studies: Secondary | ICD-10-CM

## 2013-03-07 DIAGNOSIS — Z5111 Encounter for antineoplastic chemotherapy: Secondary | ICD-10-CM

## 2013-03-07 DIAGNOSIS — D61810 Antineoplastic chemotherapy induced pancytopenia: Secondary | ICD-10-CM

## 2013-03-07 DIAGNOSIS — Z5181 Encounter for therapeutic drug level monitoring: Secondary | ICD-10-CM

## 2013-03-07 LAB — CBC WITH DIFFERENTIAL/PLATELET
BASO%: 0.7 % (ref 0.0–2.0)
Basophils Absolute: 0 10*3/uL (ref 0.0–0.1)
EOS%: 0.8 % (ref 0.0–7.0)
Eosinophils Absolute: 0.1 10*3/uL (ref 0.0–0.5)
HGB: 9.7 g/dL — ABNORMAL LOW (ref 11.6–15.9)
MCHC: 32.7 g/dL (ref 31.5–36.0)
MCV: 100.1 fL (ref 79.5–101.0)
MONO%: 15.1 % — ABNORMAL HIGH (ref 0.0–14.0)
NEUT#: 4.1 10*3/uL (ref 1.5–6.5)
RBC: 2.95 10*6/uL — ABNORMAL LOW (ref 3.70–5.45)
RDW: 19.6 % — ABNORMAL HIGH (ref 11.2–14.5)
WBC: 6.4 10*3/uL (ref 3.9–10.3)
lymph#: 1.3 10*3/uL (ref 0.9–3.3)

## 2013-03-07 LAB — COMPREHENSIVE METABOLIC PANEL (CC13)
ALT: 15 U/L (ref 0–55)
AST: 15 U/L (ref 5–34)
Alkaline Phosphatase: 68 U/L (ref 40–150)
Anion Gap: 13 mEq/L — ABNORMAL HIGH (ref 3–11)
BUN: 12.4 mg/dL (ref 7.0–26.0)
CO2: 24 mEq/L (ref 22–29)
Creatinine: 1.7 mg/dL — ABNORMAL HIGH (ref 0.6–1.1)
Sodium: 141 mEq/L (ref 136–145)
Total Bilirubin: 0.22 mg/dL (ref 0.20–1.20)
Total Protein: 6.8 g/dL (ref 6.4–8.3)

## 2013-03-07 LAB — PROTIME-INR

## 2013-03-07 MED ORDER — DEXAMETHASONE SODIUM PHOSPHATE 20 MG/5ML IJ SOLN
12.0000 mg | Freq: Once | INTRAMUSCULAR | Status: AC
  Administered 2013-03-07: 12 mg via INTRAVENOUS

## 2013-03-07 MED ORDER — PALONOSETRON HCL INJECTION 0.25 MG/5ML
0.2500 mg | Freq: Once | INTRAVENOUS | Status: AC
  Administered 2013-03-07: 0.25 mg via INTRAVENOUS

## 2013-03-07 MED ORDER — PALONOSETRON HCL INJECTION 0.25 MG/5ML
INTRAVENOUS | Status: AC
  Filled 2013-03-07: qty 5

## 2013-03-07 MED ORDER — HEPARIN SOD (PORK) LOCK FLUSH 100 UNIT/ML IV SOLN
500.0000 [IU] | Freq: Once | INTRAVENOUS | Status: AC | PRN
  Administered 2013-03-07: 500 [IU]
  Filled 2013-03-07: qty 5

## 2013-03-07 MED ORDER — DEXAMETHASONE SODIUM PHOSPHATE 20 MG/5ML IJ SOLN
INTRAMUSCULAR | Status: AC
  Filled 2013-03-07: qty 5

## 2013-03-07 MED ORDER — SODIUM CHLORIDE 0.9 % IJ SOLN
10.0000 mL | INTRAMUSCULAR | Status: DC | PRN
  Administered 2013-03-07: 10 mL
  Filled 2013-03-07: qty 10

## 2013-03-07 MED ORDER — DIPHENHYDRAMINE HCL 25 MG PO CAPS
ORAL_CAPSULE | ORAL | Status: AC
  Filled 2013-03-07: qty 2

## 2013-03-07 MED ORDER — DIPHENHYDRAMINE HCL 25 MG PO CAPS
50.0000 mg | ORAL_CAPSULE | Freq: Once | ORAL | Status: AC
  Administered 2013-03-07: 50 mg via ORAL

## 2013-03-07 MED ORDER — ACETAMINOPHEN 325 MG PO TABS
650.0000 mg | ORAL_TABLET | Freq: Once | ORAL | Status: AC
  Administered 2013-03-07: 650 mg via ORAL

## 2013-03-07 MED ORDER — GEMCITABINE HCL CHEMO INJECTION 1 GM/26.3ML
500.0000 mg/m2 | Freq: Once | INTRAVENOUS | Status: AC
  Administered 2013-03-07: 1026 mg via INTRAVENOUS
  Filled 2013-03-07: qty 27

## 2013-03-07 MED ORDER — ACETAMINOPHEN 325 MG PO TABS
ORAL_TABLET | ORAL | Status: AC
  Filled 2013-03-07: qty 2

## 2013-03-07 MED ORDER — SODIUM CHLORIDE 0.9 % IV SOLN
150.0000 mg | Freq: Once | INTRAVENOUS | Status: AC
  Administered 2013-03-07: 150 mg via INTRAVENOUS
  Filled 2013-03-07: qty 5

## 2013-03-07 MED ORDER — SODIUM CHLORIDE 0.9 % IV SOLN
500.0000 mL | INTRAVENOUS | Status: AC
  Administered 2013-03-07: 500 mL via INTRAVENOUS

## 2013-03-07 MED ORDER — RITUXIMAB CHEMO INJECTION 10 MG/ML
375.0000 mg/m2 | Freq: Once | INTRAVENOUS | Status: AC
  Administered 2013-03-07: 800 mg via INTRAVENOUS
  Filled 2013-03-07: qty 80

## 2013-03-07 MED ORDER — SODIUM CHLORIDE 0.9 % IV SOLN
37.5000 mg/m2 | Freq: Once | INTRAVENOUS | Status: AC
  Administered 2013-03-07: 77 mg via INTRAVENOUS
  Filled 2013-03-07: qty 77

## 2013-03-07 MED ORDER — POTASSIUM CHLORIDE 2 MEQ/ML IV SOLN
Freq: Once | INTRAVENOUS | Status: AC
  Administered 2013-03-07: 13:00:00 via INTRAVENOUS
  Filled 2013-03-07: qty 10

## 2013-03-07 NOTE — Telephone Encounter (Signed)
Made appts per 11/25 POF AVS and CAL given Cal printed for Shameeka to schedule Tx since MW not here today shh

## 2013-03-07 NOTE — Patient Instructions (Signed)
1. Proceed with chemotherapy today with dose reduction. 2. Return for cycle day 8 on 12/02. 3. Return for neulasta shot on 12/03.  4. Return to clinic in 2 weeks (12/09)  5. Repeat scans on 12/18 or around that time.

## 2013-03-07 NOTE — Telephone Encounter (Signed)
after SD added txs had to aj labs on 12/2 called pt LVMM of same shh

## 2013-03-07 NOTE — Progress Notes (Signed)
[facility] OFFICE PROGRESS NOTE  MCKEOWN,WILLIAM Melah Ebling, MD [address] [place] [address]  DIAGNOSIS: Non-Hodgkin's lymphoma of abdomen - Plan: CBC with Differential, Comprehensive metabolic panel, Comprehensive metabolic panel, CBC with Differential  Atrial fibrillation  Antineoplastic chemotherapy induced pancytopenia  Chronic bronchitis  COPD (chronic obstructive pulmonary disease)  Chief Complaint  Patient presents with  . 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   PAST THERAPY: q3wk R-CHOP x 6 cycles; finished in Dec 2012; followed by consolidative radiation therapy. She developed recurrent disease; started on bendamustine/Rituxan on 06/29/2012. She completed 3 cycles on 08/25/12. Stopped due to disease progression.   CURRENT THERAPY: Started salvage chemotherapy with R-GPD on 10/26/12 on q 3week cycles. Her last dose was on 02/09/2013 following 4 cycles. She received neupogen on day# 9.  She is scheduled for her 5 th cycle today.   INTERVAL HISTORY:  [name] 72 y.o. female returns for follow up visit. She was last seen by me on 02/28/2013. Today is she is accompanied by her nieces [name] an [name].  She reports doing well overall and has recently moved back to her home.  She reports a good appetite.  She has a cough which was evaluated by her PCP and she has started allegra.  She also was provided antibiotics (did not recall which type) and she a has three days left.     Of note, she had a 11-day hospitalization at [facility] (02/11/13 - 02/22/2013) secondary to febrile neutropenia.  Her infectious work-up was negative and her thrombocytopenia was prolonged.  She received antibiotics and plt transfusion.   She denies headaches or visual changes.  She reports a long history of cough.  She is a former longstanding smoker who quit over the past year.  She denies  without fever or  chills.    She denied headaches, double vision, blurry vision, nasal discharge, hearing problems, odynophagia or dysphagia. No chest pain, palpitations, dyspnea, cough, abdominal pain, vomiting, diarrhea, constipation, hematochezia. The patient denied dysuria, nocturia, polyuria, hematuria, myalgia, numbness, tingling, psychiatric problems.  MEDICAL HISTORY: Past Medical History  Diagnosis Date  . Sick sinus syndrome   . Hematoma     At the site of the pacemaker insertion.  . Hypotension   . Dyslipidemia   . Pacemaker   . History of atrial fibrillation   . Arthritis     Osteoarthritis  . Port-a-cath in place 12/08/10  . Status post chemotherapy completed 03/2011    R - CHOP q 3 weeks x 6  . S/P radiation therapy 05/28/2011 - 06/26/2011    Right Abdomen and Right Pelvis/3060 cGy in 17 Fractions  . Diffuse large B cell lymphoma   . Cancer 10/2010    large B cell lymphoma  . 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 Hypertension   . ASHD (arteriosclerotic heart disease)   . Gout   . COPD (chronic obstructive pulmonary disease)   . Asthma   . Hyperlipidemia   . Diabetes mellitus   . Hypothyroidism     INTERIM HISTORY: has Essential hypertension, benign; Atrial fibrillation; Cardiac pacemaker in situ; Diffuse large B cell lymphoma; Hypothyroidism; Diabetes mellitus type 2 in obese; Hyperlipidemia; Non-Hodgkin's lymphoma of abdomen; Depression; Fever and neutropenia; Antineoplastic chemotherapy induced pancytopenia; Acute renal failure; Anemia of chronic disease; Chronic bronchitis; Hypertension; ASHD (arteriosclerotic heart disease); Gout; COPD (chronic obstructive pulmonary disease); Asthma; and Diabetes mellitus  on her problem list.    ALLERGIES:  is allergic to codeine and penicillins.  MEDICATIONS: has a current medication list which includes the following prescription(s): albuterol, allopurinol, vitamin d3, citalopram, diltiazem, dss,  levothyroxine, lidocaine-prilocaine, loperamide, magnesium oxide, metformin, ondansetron, potassium chloride, pravastatin, prochlorperazine, sulfamethoxazole-trimethoprim, and warfarin, and the following Facility-Administered Medications: CISplatin (PLATINOL) 77 mg in sodium chloride 0.9 % 250 mL chemo infusion, Gemcitabine HCl (GEMZAR) 1,026 mg in sodium chloride 0.9 % 100 mL chemo infusion, heparin lock flush, sodium chloride, and sodium chloride.  SURGICAL HISTORY:  Past Surgical History  Procedure Laterality Date  . Pacemaker insertion      Lead revision completed August 04, 2006  . Cholecystectomy    . Tubal ligation      Bilateral  . Insert / replace / remove pacemaker    . Abdominal hysterectomy    . Biopsy stomach  11/03/10    Soft Tissue Mass, Biopsy, Right Lower Quadrant Mesenteric Mass- High Grade Non-Hodgkins B Cell Lymphoma  with Flow Cytometry  . Bone biopsy  11/24/10    Bone Marrow, Aspirate Biospy, and clot, Left - No Involvement of Non-Hodgkin's Lymphoma Identified    REVIEW OF SYSTEMS:   Constitutional: Denies fevers, chills or abnormal weight loss Eyes: Denies blurriness of vision Ears, nose, mouth, throat, and face: Denies mucositis or sore throat Respiratory: Denies cough, dyspnea or wheezes Cardiovascular: Denies palpitation, chest discomfort or lower extremity swelling Gastrointestinal:  Denies nausea, heartburn or change in bowel habits Skin: Denies abnormal skin rashes Lymphatics: Denies new lymphadenopathy or easy bruising Neurological:Denies numbness, tingling or new weaknesses Behavioral/Psych: Mood is stable, no new changes  All other systems were reviewed with the patient and are negative.  PHYSICAL EXAMINATION: ECOG PERFORMANCE STATUS: 2 - Symptomatic, <50% confined to bed  Blood pressure 122/71, pulse 102, temperature 97.6 F (36.4 C), temperature source Oral, resp. rate 20, height 4\' 8"  (1.422 m), weight 214 lb 3.2 oz (97.16 kg).  GENERAL:alert, no  distress and comfortable;obese, sitting in wheelchair SKIN: skin color, texture, turgor are normal, no rashes or significant lesions EYES: normal, Conjunctiva are pink and non-injected, sclera clear OROPHARYNX:no exudate, no erythema and lips, buccal mucosa, and tongue normal  NECK: supple, thyroid normal size, non-tender, without nodularity LYMPH:  no palpable lymphadenopathy in the cervical, axillary or supraclavicular LUNGS: CTA/ BL with normal breathing effort HEART: slightly tachycardia and no murmurs and no lower extremity edema ABDOMEN: obese, abdomen soft, non-tender and normal bowel sounds Musculoskeletal:no cyanosis of digits and no clubbing  NEURO: alert & oriented x 3 with fluent speech, no focal motor/sensory deficits  LABORATORY DATA: Results for orders placed in visit on 03/07/13 (from the past 48 hour(s))  COMPREHENSIVE METABOLIC PANEL (CC13)     Status: Abnormal   Collection Time    03/07/13  8:47 AM      Result Value Range   Sodium 141  136 - 145 mEq/L   Potassium 5.0  3.5 - 5.1 mEq/L   Chloride 103  98 - 109 mEq/L   CO2 24  22 - 29 mEq/L   Glucose 141 (*) 70 - 140 mg/dl   BUN 40.9  7.0 - 81.1 mg/dL   Creatinine 1.7 (*) 0.6 - 1.1 mg/dL   Total Bilirubin 9.14  0.20 - 1.20 mg/dL   Alkaline Phosphatase 68  40 - 150 U/L   AST 15  5 - 34 U/L   ALT 15  0 - 55 U/L   Total Protein 6.8  6.4 - 8.3 g/dL  Albumin 3.7  3.5 - 5.0 g/dL   Calcium 16.1  8.4 - 09.6 mg/dL   Anion Gap 13 (*) 3 - 11 mEq/L  CBC WITH DIFFERENTIAL     Status: Abnormal   Collection Time    03/07/13  8:48 AM      Result Value Range   WBC 6.4  3.9 - 10.3 10e3/uL   NEUT# 4.1  1.5 - 6.5 10e3/uL   HGB 9.7 (*) 11.6 - 15.9 g/dL   HCT 04.5 (*) 40.9 - 81.1 %   Platelets 251  145 - 400 10e3/uL   MCV 100.1  79.5 - 101.0 fL   MCH 32.8  25.1 - 34.0 pg   MCHC 32.7  31.5 - 36.0 g/dL   RBC 9.14 (*) 7.82 - 9.56 10e6/uL   RDW 19.6 (*) 11.2 - 14.5 %   lymph# 1.3  0.9 - 3.3 10e3/uL   MONO# 1.0 (*) 0.1 - 0.9  10e3/uL   Eosinophils Absolute 0.1  0.0 - 0.5 10e3/uL   Basophils Absolute 0.0  0.0 - 0.1 10e3/uL   NEUT% 63.0  38.4 - 76.8 %   LYMPH% 20.4  14.0 - 49.7 %   MONO% 15.1 (*) 0.0 - 14.0 %   EOS% 0.8  0.0 - 7.0 %   BASO% 0.7  0.0 - 2.0 %  LACTATE DEHYDROGENASE (CC13)     Status: Abnormal   Collection Time    03/07/13  8:49 AM      Result Value Range   LDH 298 (*) 125 - 245 U/L  PROTIME-INR     Status: Abnormal   Collection Time    03/07/13  8:49 AM      Result Value Range   Protime 36.0 (*) 10.6 - 13.4 Seconds   INR 3.00  2.00 - 3.50   Comment: INR is useful only to assess adequacy of anticoagulation with coumadin when comparing results from different labs. It should not be used to estimate bleeding risk or presence/abscense of coagulopathy in patients not on coumadin. Expected INR ranges for      nontherapeutic patients is 0.88 - 1.12.   Lovenox No      Labs:  Lab Results  Component Value Date   WBC 6.4 03/07/2013   HGB 9.7* 03/07/2013   HCT 29.6* 03/07/2013   MCV 100.1 03/07/2013   PLT 251 03/07/2013   NEUTROABS 4.1 03/07/2013      Chemistry      Component Value Date/Time   NA 141 03/07/2013 0847   NA 137 02/22/2013 0435   NA 141 10/12/2011 0957   K 5.0 03/07/2013 0847   K 4.1 02/22/2013 0435   K 4.4 10/12/2011 0957   CL 98 02/22/2013 0435   CL 102 09/21/2012 0917   CL 95* 10/12/2011 0957   CO2 24 03/07/2013 0847   CO2 29 02/22/2013 0435   CO2 29 10/12/2011 0957   BUN 12.4 03/07/2013 0847   BUN 25* 02/22/2013 0435   BUN 15 10/12/2011 0957   CREATININE 1.7* 03/07/2013 0847   CREATININE 1.15* 02/22/2013 0435   CREATININE 0.9 10/12/2011 0957      Component Value Date/Time   CALCIUM 10.0 03/07/2013 0847   CALCIUM 9.5 02/22/2013 0435   CALCIUM 9.1 10/12/2011 0957   ALKPHOS 68 03/07/2013 0847   ALKPHOS 49 02/11/2013 2333   ALKPHOS 65 10/12/2011 0957   AST 15 03/07/2013 0847   AST 11 02/11/2013 2333   AST 19 10/12/2011 0957   ALT 15 03/07/2013  0847   ALT 19 02/11/2013 2333   ALT  20 10/12/2011 0957   BILITOT 0.22 03/07/2013 0847   BILITOT 0.5 02/11/2013 2333   BILITOT 0.60 10/12/2011 0957     Basic Metabolic Panel:  Recent Labs Lab 03/07/13 0847  NA 141  K 5.0  CO2 24  GLUCOSE 141*  BUN 12.4  CREATININE 1.7*  CALCIUM 10.0   GFR Estimated Creatinine Clearance: 28.7 ml/min (by C-G formula based on Cr of 1.7). Liver Function Tests:  Recent Labs Lab 03/07/13 0847  AST 15  ALT 15  ALKPHOS 68  BILITOT 0.22  PROT 6.8  ALBUMIN 3.7   Coagulation profile  Recent Labs Lab 03/07/13 0849  INR 3.00  PROTIME 36.0*    CBC:  Recent Labs Lab 03/07/13 0848  WBC 6.4  NEUTROABS 4.1  HGB 9.7*  HCT 29.6*  MCV 100.1  PLT 251    RADIOGRAPHIC STUDIES: Dg Chest 2 View  01/10/2013   CLINICAL DATA:  Cough. Dyspnea.  EXAM: CHEST  2 VIEW  COMPARISON:  01/01/2013  FINDINGS: Power port and a dual lead pacer are in place. Heart size and vascularity are normal and the lungs are clear. No effusions. No significant osseous abnormality.  IMPRESSION: No active cardiopulmonary disease.   Electronically Signed   By: Geanie Cooley   On: 01/10/2013 10:23   Dg Chest 2 View  01/01/2013   CLINICAL DATA:  Chest pain and cough  EXAM: CHEST  2 VIEW  COMPARISON:  Chest radiograph April 05, 2011 and chest CT December 23, 2012  FINDINGS: Lungs are clear. Heart is upper normal in size with normal pulmonary vascularity. No adenopathy. Port-A-Cath tip is in the superior vena cava near the cavoatrial junction. Pacemaker leads are attached to the right atrium and middle cardiac vein. No pneumothorax. .  IMPRESSION: No edema or consolidation.   Electronically Signed   By: Bretta Bang   On: 01/01/2013 17:46    ASSESSMENT: [name] 72 y.o. female with a history of Non-Hodgkin's lymphoma of abdomen - Plan: CBC with Differential, Comprehensive metabolic panel, Comprehensive metabolic panel, CBC with Differential  Atrial fibrillation  Antineoplastic chemotherapy induced  pancytopenia  Chronic bronchitis  COPD (chronic obstructive pulmonary disease)  PLAN:  1. Relapsed high grade non-hodgkin's B cell Lymphoma on salvage therapy   -Chemotherapy reference: Relapsed high grade non-Hodgkin's B cell lymphoma on salvage therapy with DGP Harrold Donath M et. Al, Cancer 2004) + Rituximab  - She completed 4 cycles of R+DGP q 21 days (modified doses based on dehydration, electrolyte abnormalities) and last visit we reviewed the CT Scans of C/A/P which was consistent with a mild  increase in retroperitoneal lymphadenopathy. She is still recovering from her prolonged hospitalization from neutropenia fever (11/01 -11/12) and has nasal congestion and chronic cough likely secondary to chronic bronchitis.  We delayed her chemotherapy until today based on the above noted.     We will continue her R+DGP for cycles # 5 as demonstrated below:   Her Gemcitabine will be reduced further given her prolonged neutropenia.    Oncology Flowsheet 03/07/2013 (Day #1)  Day, Cycle Day 1, Cycle 4  CISplatin (PLATINOL) IV 37.5 mg/m2  (reduced by 50% due to creatinine clearance)  dexamethasone (DECADRON) IV 12 mg  diphenhydrAMINE (BENADRYL) PO 50 mg  fosaprepitant (EMEND) IV 150 mg  Gemcitabine HCl (GEMZAR) IV 500 mg/m2 (reduced by 50%)  palonosetron (ALOXI) IV 0.25 mg  RiTUXimab (RITUXAN) IV 375 mg/m2    Oncology Flowsheet 03/14/2013  (  Day #8)  Day, Cycle Day 8, Cycle 4  diphenhydrAMINE (BENADRYL) PO 25 mg  Gemcitabine HCl (GEMZAR) IV 500 mg/m2 (reduced by 50%)  prochlorperazine (COMPAZINE) PO 10 mg   Oncology Flowsheet 03/15/2013  (Day #9)  Day, Cycle Day 9, Cycle 4  Pegfilgrastim (NEULASTA) [place] 6 mg   -Patient was also instructed to take dexamethasone 40 mg daily on days 2-4 (and prescription was e-prescribed and patient notified).  She will also receive neulasta based on her prior high risk of infection and prior episode of febrile neutropenia. Marland Kitchen    --We will repeat scans to assess  response on 03/29/13.  Given her significant co-morbidities, I favor against bone marrow transplant.   Again, patient was counseled on the chemotherapy plan including its indications to control her disease and the risks including but not limited to myelotoxicity which can result in life-threatening infections, nephrotoxicity, ototoxicity and neurotoxicity, nausea/vomiting, electrolyte abnormalities and fatigue.  She understood the benefits and risks and consented to proceed with therapy.    2.  INR.  -- Her INR is 3.0 today.   Her goal is between 2 and 3.   She denies any bleeding.  We will continue to monitor closely.  Coumadin being managed per her PCP.  We will hold her coumadin for plts less than 50K.   3. Elevated creatinine.  --Creatinine is up to 1.7 slightly increased from 1.3 (Range 1.1 to 1.6 over the past one year). We encouraged aggressive hydration and avoidance of nephrotoxins.   We will continue to closely monitor her labs weekly. Normal saline 500 ml bolus will be given with her chemotherapy.   4. Anemia likely secondary to chronic illnesses and chemotherapy. -- Her hemoglobin is 9.7 today.  She is asymptomatic presently.     5. Hypothyroidism: Levothyroxine per PCP  6.  Hypertension: Diltiazem per PCP.   7. Diabetes mellitus, type II: Metformin per PCP.   8. Hyperlipidemia: Pravastatin per PCP.   9. History of atrial fibrillation: Diltiazem, pacemaker. Coumadin being managed per her PCP.  10. Follow-up in 3 weeks for consideration of cycle #6 of R-DGP.  We will obtain CBC, CMP.    All questions were answered. The patient knows to call the clinic with any problems, questions or concerns. We can certainly see the patient much sooner if necessary.  I spent 15 minutes counseling the patient face to face. The total time spent in the appointment was 25 minutes.    Isahi Godwin, MD 03/07/2013 3:28 PM

## 2013-03-07 NOTE — Patient Instructions (Signed)
[facility] Discharge Instructions for Patients Receiving Chemotherapy  Today you received the following chemotherapy agents: Rituxan, Cisplatin, and Gemzar.  To help prevent nausea and vomiting after your treatment, we encourage you to take your nausea medication.   If you develop nausea and vomiting that is not controlled by your nausea medication, call the clinic.   BELOW ARE SYMPTOMS THAT SHOULD BE REPORTED IMMEDIATELY:  *FEVER GREATER THAN 100.5 F  *CHILLS WITH OR WITHOUT FEVER  NAUSEA AND VOMITING THAT IS NOT CONTROLLED WITH YOUR NAUSEA MEDICATION  *UNUSUAL SHORTNESS OF BREATH  *UNUSUAL BRUISING OR BLEEDING  TENDERNESS IN MOUTH AND THROAT WITH OR WITHOUT PRESENCE OF ULCERS  *URINARY PROBLEMS  *BOWEL PROBLEMS  UNUSUAL RASH Items with * indicate a potential emergency and should be followed up as soon as possible.  Feel free to call the clinic you have any questions or concerns. The clinic phone number is [phone].

## 2013-03-08 ENCOUNTER — Telehealth: Payer: Self-pay | Admitting: Medical Oncology

## 2013-03-08 NOTE — Telephone Encounter (Signed)
[facility] called asking if they can add some home visits for the pt. They were not able to locate the pt after her discharge. Pt is now at home and they have spoke with her this am. Per Dr. Rosie Fate this is fine.

## 2013-03-14 ENCOUNTER — Encounter: Payer: Self-pay | Admitting: *Deleted

## 2013-03-14 ENCOUNTER — Other Ambulatory Visit: Payer: Medicare Other | Admitting: Lab

## 2013-03-14 ENCOUNTER — Ambulatory Visit (HOSPITAL_BASED_OUTPATIENT_CLINIC_OR_DEPARTMENT_OTHER): Payer: Medicare Other

## 2013-03-14 ENCOUNTER — Other Ambulatory Visit: Payer: Self-pay | Admitting: Internal Medicine

## 2013-03-14 ENCOUNTER — Other Ambulatory Visit (HOSPITAL_BASED_OUTPATIENT_CLINIC_OR_DEPARTMENT_OTHER): Payer: Medicare Other | Admitting: Lab

## 2013-03-14 VITALS — BP 128/82 | HR 96 | Temp 96.8°F | Resp 20

## 2013-03-14 DIAGNOSIS — D649 Anemia, unspecified: Secondary | ICD-10-CM

## 2013-03-14 DIAGNOSIS — Z5111 Encounter for antineoplastic chemotherapy: Secondary | ICD-10-CM

## 2013-03-14 DIAGNOSIS — C8593 Non-Hodgkin lymphoma, unspecified, intra-abdominal lymph nodes: Secondary | ICD-10-CM

## 2013-03-14 DIAGNOSIS — C8330 Diffuse large B-cell lymphoma, unspecified site: Secondary | ICD-10-CM

## 2013-03-14 DIAGNOSIS — C8583 Other specified types of non-Hodgkin lymphoma, intra-abdominal lymph nodes: Secondary | ICD-10-CM

## 2013-03-14 LAB — COMPREHENSIVE METABOLIC PANEL (CC13)
Albumin: 3.5 g/dL (ref 3.5–5.0)
Alkaline Phosphatase: 65 U/L (ref 40–150)
Anion Gap: 13 mEq/L — ABNORMAL HIGH (ref 3–11)
BUN: 19.9 mg/dL (ref 7.0–26.0)
CO2: 25 mEq/L (ref 22–29)
Calcium: 9.9 mg/dL (ref 8.4–10.4)
Chloride: 103 mEq/L (ref 98–109)
Creatinine: 1.3 mg/dL — ABNORMAL HIGH (ref 0.6–1.1)
Sodium: 141 mEq/L (ref 136–145)
Total Bilirubin: <0.2 mg/dL (ref 0.20–1.20)
Total Protein: 6.7 g/dL (ref 6.4–8.3)

## 2013-03-14 LAB — CBC WITH DIFFERENTIAL/PLATELET
BASO%: 3.3 % — ABNORMAL HIGH (ref 0.0–2.0)
Basophils Absolute: 0.1 10*3/uL (ref 0.0–0.1)
HCT: 28.9 % — ABNORMAL LOW (ref 34.8–46.6)
HGB: 9.7 g/dL — ABNORMAL LOW (ref 11.6–15.9)
LYMPH%: 31 % (ref 14.0–49.7)
MCH: 33.5 pg (ref 25.1–34.0)
MCHC: 33.5 g/dL (ref 31.5–36.0)
MONO#: 0.2 10*3/uL (ref 0.1–0.9)
MONO%: 6.5 % (ref 0.0–14.0)
NEUT%: 58.8 % (ref 38.4–76.8)
Platelets: 177 10*3/uL (ref 145–400)
RBC: 2.88 10*6/uL — ABNORMAL LOW (ref 3.70–5.45)
RDW: 18.1 % — ABNORMAL HIGH (ref 11.2–14.5)
lymph#: 0.7 10*3/uL — ABNORMAL LOW (ref 0.9–3.3)

## 2013-03-14 MED ORDER — SODIUM CHLORIDE 0.9 % IJ SOLN
10.0000 mL | INTRAMUSCULAR | Status: DC | PRN
  Administered 2013-03-14: 10 mL
  Filled 2013-03-14: qty 10

## 2013-03-14 MED ORDER — PROCHLORPERAZINE MALEATE 10 MG PO TABS
ORAL_TABLET | ORAL | Status: AC
  Filled 2013-03-14: qty 1

## 2013-03-14 MED ORDER — PROCHLORPERAZINE MALEATE 10 MG PO TABS
10.0000 mg | ORAL_TABLET | Freq: Once | ORAL | Status: AC
  Administered 2013-03-14: 10 mg via ORAL

## 2013-03-14 MED ORDER — SODIUM CHLORIDE 0.9 % IV SOLN
Freq: Once | INTRAVENOUS | Status: AC
  Administered 2013-03-14: 15:00:00 via INTRAVENOUS

## 2013-03-14 MED ORDER — SODIUM CHLORIDE 0.9 % IV SOLN
500.0000 mg/m2 | Freq: Once | INTRAVENOUS | Status: AC
  Administered 2013-03-14: 1026 mg via INTRAVENOUS
  Filled 2013-03-14: qty 27

## 2013-03-14 MED ORDER — HEPARIN SOD (PORK) LOCK FLUSH 100 UNIT/ML IV SOLN
500.0000 [IU] | Freq: Once | INTRAVENOUS | Status: AC | PRN
  Administered 2013-03-14: 500 [IU]
  Filled 2013-03-14: qty 5

## 2013-03-14 NOTE — Progress Notes (Signed)
Confirmed with Dr. Rosie Fate OK to treat today with ANC 1.4. Proceed with Neulasta on 12/3 as scheduled and recheck counts in 1 week. Reported abdominal discomfort towards end of infusion. After several belches and passing gas in bathroom she felt much better.

## 2013-03-14 NOTE — Patient Instructions (Addendum)
[facility] Discharge Instructions for Patients Receiving Chemotherapy  Today you received the following chemotherapy agents Gemzar   To help prevent nausea and vomiting after your treatment, we encourage you to take your nausea medication as needed  Zofran 8 mg every 12 hours as needed  Compazine 10 mg every 6 hours as needed   If you develop nausea and vomiting that is not controlled by your nausea medication, call the clinic.   BELOW ARE SYMPTOMS THAT SHOULD BE REPORTED IMMEDIATELY:  *FEVER GREATER THAN 100.5 F  *CHILLS WITH OR WITHOUT FEVER  NAUSEA AND VOMITING THAT IS NOT CONTROLLED WITH YOUR NAUSEA MEDICATION  *UNUSUAL SHORTNESS OF BREATH  *UNUSUAL BRUISING OR BLEEDING  TENDERNESS IN MOUTH AND THROAT WITH OR WITHOUT PRESENCE OF ULCERS  *URINARY PROBLEMS  *BOWEL PROBLEMS  UNUSUAL RASH Items with * indicate a potential emergency and should be followed up as soon as possible.  Feel free to call the clinic you have any questions or concerns. The clinic phone number is [phone].  It has been a pleasure to serve you today !

## 2013-03-14 NOTE — Progress Notes (Signed)
Chaplain made follow up visit. Counseling intern Sam was present so chaplain just said hi. Pt responded that she had been in the hospital for two weeks. Chaplain asked about the pt's husband and son, both of whom are in assisted living facilities. She said that her husband, who has alzheimer's, recently got in a fight and was moved to a different facility. Pt has some support from nieces and nephews but cannot drive herself. Pt seems to be coping with her situation. Pt also talked about her dogs and how she is not as able to take care of them anymore. Chaplain practiced active listening and empathic presence. Chp will follow up as necessary.

## 2013-03-14 NOTE — Progress Notes (Signed)
Chaplain made follow up visit. Counseling intern Sam was present so chaplain just said hi. Pt responded that she had been in the hospital for two weeks. Chaplain asked about the pt's husband and son, both of whom are in assisted living facilities. She said that her husband, who has alzheimer's, recently got in a fight and was moved to a different facility. Pt has some support from nieces and nephews but cannot drive herself. Pt seems to be coping with her situation. Pt also talked about her dogs and how she is not as able to take care of them anymore. Chaplain practiced active listening and empathic presence. Chp will follow up as necessary.  

## 2013-03-15 ENCOUNTER — Encounter: Payer: Self-pay | Admitting: *Deleted

## 2013-03-15 ENCOUNTER — Ambulatory Visit (HOSPITAL_BASED_OUTPATIENT_CLINIC_OR_DEPARTMENT_OTHER): Payer: Medicare Other

## 2013-03-15 ENCOUNTER — Other Ambulatory Visit: Payer: Self-pay | Admitting: Internal Medicine

## 2013-03-15 VITALS — BP 115/56 | HR 73 | Temp 98.2°F

## 2013-03-15 DIAGNOSIS — C8589 Other specified types of non-Hodgkin lymphoma, extranodal and solid organ sites: Secondary | ICD-10-CM

## 2013-03-15 DIAGNOSIS — C8330 Diffuse large B-cell lymphoma, unspecified site: Secondary | ICD-10-CM

## 2013-03-15 MED ORDER — PEGFILGRASTIM INJECTION 6 MG/0.6ML
6.0000 mg | Freq: Once | SUBCUTANEOUS | Status: AC
  Administered 2013-03-15: 6 mg via SUBCUTANEOUS
  Filled 2013-03-15: qty 0.6

## 2013-03-15 NOTE — Patient Instructions (Signed)

## 2013-03-15 NOTE — Progress Notes (Signed)
Chaplain made follow-up visit with patient in lobby. Pt said she was doing okay and just had an injection today. She expressed frustration over her ride not being here. Chaplain expressed empathy and practiced active listening.

## 2013-03-21 ENCOUNTER — Ambulatory Visit (HOSPITAL_BASED_OUTPATIENT_CLINIC_OR_DEPARTMENT_OTHER): Payer: Medicare Other

## 2013-03-21 ENCOUNTER — Encounter: Payer: Self-pay | Admitting: *Deleted

## 2013-03-21 ENCOUNTER — Telehealth: Payer: Self-pay | Admitting: Internal Medicine

## 2013-03-21 ENCOUNTER — Ambulatory Visit (HOSPITAL_BASED_OUTPATIENT_CLINIC_OR_DEPARTMENT_OTHER): Payer: Medicare Other | Admitting: Internal Medicine

## 2013-03-21 ENCOUNTER — Ambulatory Visit: Payer: Medicare Other | Admitting: Lab

## 2013-03-21 ENCOUNTER — Ambulatory Visit (HOSPITAL_COMMUNITY)
Admission: RE | Admit: 2013-03-21 | Discharge: 2013-03-21 | Disposition: A | Discharge status: Home / Self Care | Payer: Medicare Other | Source: Ambulatory Visit | Attending: Hematology and Oncology | Admitting: Hematology and Oncology

## 2013-03-21 ENCOUNTER — Other Ambulatory Visit (HOSPITAL_BASED_OUTPATIENT_CLINIC_OR_DEPARTMENT_OTHER): Payer: Medicare Other | Admitting: Lab

## 2013-03-21 VITALS — BP 114/57 | HR 82 | Temp 98.1°F | Resp 20 | Ht <= 58 in | Wt 221.7 lb

## 2013-03-21 VITALS — BP 109/51 | HR 80 | Temp 98.5°F

## 2013-03-21 DIAGNOSIS — I4891 Unspecified atrial fibrillation: Secondary | ICD-10-CM

## 2013-03-21 DIAGNOSIS — E119 Type 2 diabetes mellitus without complications: Secondary | ICD-10-CM

## 2013-03-21 DIAGNOSIS — C8583 Other specified types of non-Hodgkin lymphoma, intra-abdominal lymph nodes: Secondary | ICD-10-CM

## 2013-03-21 DIAGNOSIS — D6481 Anemia due to antineoplastic chemotherapy: Secondary | ICD-10-CM

## 2013-03-21 DIAGNOSIS — D702 Other drug-induced agranulocytosis: Secondary | ICD-10-CM

## 2013-03-21 DIAGNOSIS — C8330 Diffuse large B-cell lymphoma, unspecified site: Secondary | ICD-10-CM

## 2013-03-21 DIAGNOSIS — T451X5A Adverse effect of antineoplastic and immunosuppressive drugs, initial encounter: Secondary | ICD-10-CM | POA: Insufficient documentation

## 2013-03-21 DIAGNOSIS — I10 Essential (primary) hypertension: Secondary | ICD-10-CM

## 2013-03-21 DIAGNOSIS — D638 Anemia in other chronic diseases classified elsewhere: Secondary | ICD-10-CM

## 2013-03-21 DIAGNOSIS — D61810 Antineoplastic chemotherapy induced pancytopenia: Secondary | ICD-10-CM

## 2013-03-21 DIAGNOSIS — D649 Anemia, unspecified: Secondary | ICD-10-CM | POA: Insufficient documentation

## 2013-03-21 DIAGNOSIS — C8593 Non-Hodgkin lymphoma, unspecified, intra-abdominal lymph nodes: Secondary | ICD-10-CM

## 2013-03-21 DIAGNOSIS — E039 Hypothyroidism, unspecified: Secondary | ICD-10-CM

## 2013-03-21 LAB — CBC WITH DIFFERENTIAL/PLATELET
BASO%: 0.4 % (ref 0.0–2.0)
EOS%: 0.1 % (ref 0.0–7.0)
LYMPH%: 10.6 % — ABNORMAL LOW (ref 14.0–49.7)
MCHC: 33.8 g/dL (ref 31.5–36.0)
MCV: 98.4 fL (ref 79.5–101.0)
MONO#: 0.4 10*3/uL (ref 0.1–0.9)
MONO%: 4.6 % (ref 0.0–14.0)
Platelets: 11 10*3/uL — ABNORMAL LOW (ref 145–400)
RBC: 2.22 10*6/uL — ABNORMAL LOW (ref 3.70–5.45)
WBC: 8.6 10*3/uL (ref 3.9–10.3)

## 2013-03-21 LAB — COMPREHENSIVE METABOLIC PANEL (CC13)
AST: 14 U/L (ref 5–34)
Albumin: 3.6 g/dL (ref 3.5–5.0)
Alkaline Phosphatase: 85 U/L (ref 40–150)
Anion Gap: 11 mEq/L (ref 3–11)
CO2: 26 mEq/L (ref 22–29)
Glucose: 106 mg/dl (ref 70–140)
Potassium: 4.7 mEq/L (ref 3.5–5.1)
Sodium: 141 mEq/L (ref 136–145)
Total Protein: 6.4 g/dL (ref 6.4–8.3)

## 2013-03-21 MED ORDER — HEPARIN SOD (PORK) LOCK FLUSH 100 UNIT/ML IV SOLN
500.0000 [IU] | Freq: Every day | INTRAVENOUS | Status: AC | PRN
  Administered 2013-03-21: 500 [IU]
  Filled 2013-03-21: qty 5

## 2013-03-21 MED ORDER — FUROSEMIDE 10 MG/ML IJ SOLN
20.0000 mg | Freq: Once | INTRAMUSCULAR | Status: AC
  Administered 2013-03-21: 20 mg via INTRAVENOUS

## 2013-03-21 MED ORDER — SODIUM CHLORIDE 0.9 % IJ SOLN
10.0000 mL | INTRAMUSCULAR | Status: AC | PRN
  Administered 2013-03-21: 10 mL
  Filled 2013-03-21: qty 10

## 2013-03-21 MED ORDER — SODIUM CHLORIDE 0.9 % IV SOLN
250.0000 mL | Freq: Once | INTRAVENOUS | Status: AC
  Administered 2013-03-21: 250 mL via INTRAVENOUS

## 2013-03-21 MED ORDER — ACETAMINOPHEN 325 MG PO TABS
650.0000 mg | ORAL_TABLET | Freq: Once | ORAL | Status: AC
Start: 2013-03-21 — End: 2013-03-21
  Administered 2013-03-21: 650 mg via ORAL

## 2013-03-21 MED ORDER — ACETAMINOPHEN 325 MG PO TABS
ORAL_TABLET | ORAL | Status: AC
  Filled 2013-03-21: qty 2

## 2013-03-21 NOTE — Progress Notes (Signed)
Blood and platelets scanned onto wrong flowsheet columns.  Numbers/VS are correct.

## 2013-03-21 NOTE — Patient Instructions (Signed)
1. Please hold coumadin.   We willl resume once plts are greater 50,000. 2. Please come back for labs on 12/12.   3. We will perform CT of chest/abdomen and pelvis on 12/16.   4. Follow up with me to discuss scan on 12/18.    Nosebleed Nosebleeds can be caused by many conditions including trauma, infections, polyps, foreign bodies, dry mucous membranes or climate, medications and air conditioning. Most nosebleeds occur in the front of the nose. It is because of this location that most nosebleeds can be controlled by pinching the nostrils gently and continuously. Do this for at least 10 to 20 minutes. The reason for this long continuous pressure is that you must hold it long enough for the blood to clot. If during that 10 to 20 minute time period, pressure is released, the process may have to be started again. The nosebleed may stop by itself, quit with pressure, need concentrated heating (cautery) or stop with pressure from packing. HOME CARE INSTRUCTIONS   If your nose was packed, try to maintain the pack inside until your caregiver removes it. If a gauze pack was used and it starts to fall out, gently replace or cut the end off. Do not cut if a balloon catheter was used to pack the nose. Otherwise, do not remove unless instructed.  Avoid blowing your nose for 12 hours after treatment. This could dislodge the pack or clot and start bleeding again.  If the bleeding starts again, sit up and bending forward, gently pinch the front half of your nose continuously for 20 minutes.  If bleeding was caused by dry mucous membranes, cover the inside of your nose every morning with a petroleum or antibiotic ointment. Use your little fingertip as an applicator. Do this as needed during dry weather. This will keep the mucous membranes moist and allow them to heal.  Maintain humidity in your home by using less air conditioning or using a humidifier.  Do not use aspirin or medications which make bleeding more  likely. Your caregiver can give you recommendations on this.  Resume normal activities as able but try to avoid straining, lifting or bending at the waist for several days.  If the nosebleeds become recurrent and the cause is unknown, your caregiver may suggest laboratory tests. SEEK IMMEDIATE MEDICAL CARE IF:   Bleeding recurs and cannot be controlled.  There is unusual bleeding from or bruising on other parts of the body.  You have a fever.  Nosebleeds continue.  There is any worsening of the condition which originally brought you in.  You become lightheaded, feel faint, become sweaty or vomit blood. MAKE SURE YOU:   Understand these instructions.  Will watch your condition.  Will get help right away if you are not doing well or get worse. Document Released: 01/07/2005 Document Revised: 06/22/2011 Document Reviewed: 03/01/2009 [facility] Patient Information 2014 [place], [place].

## 2013-03-21 NOTE — Patient Instructions (Signed)
Blood Transfusion Information WHAT IS A BLOOD TRANSFUSION? A transfusion is the replacement of blood or some of its parts. Blood is made up of multiple cells which provide different functions.  Red blood cells carry oxygen and are used for blood loss replacement.  White blood cells fight against infection.  Platelets control bleeding.  Plasma helps clot blood.  Other blood products are available for specialized needs, such as hemophilia or other clotting disorders. BEFORE THE TRANSFUSION  Who gives blood for transfusions?   You may be able to donate blood to be used at a later date on yourself (autologous donation).  Relatives can be asked to donate blood. This is generally not any safer than if you have received blood from a stranger. The same precautions are taken to ensure safety when a relative's blood is donated.  Healthy volunteers who are fully evaluated to make sure their blood is safe. This is blood bank blood. Transfusion therapy is the safest it has ever been in the practice of medicine. Before blood is taken from a donor, a complete history is taken to make sure that person has no history of diseases nor engages in risky social behavior (examples are intravenous drug use or sexual activity with multiple partners). The donor's travel history is screened to minimize risk of transmitting infections, such as malaria. The donated blood is tested for signs of infectious diseases, such as HIV and hepatitis. The blood is then tested to be sure it is compatible with you in order to minimize the chance of a transfusion reaction. If you or a relative donates blood, this is often done in anticipation of surgery and is not appropriate for emergency situations. It takes many days to process the donated blood. RISKS AND COMPLICATIONS Although transfusion therapy is very safe and saves many lives, the main dangers of transfusion include:   Getting an infectious disease.  Developing a  transfusion reaction. This is an allergic reaction to something in the blood you were given. Every precaution is taken to prevent this. The decision to have a blood transfusion has been considered carefully by your caregiver before blood is given. Blood is not given unless the benefits outweigh the risks. AFTER THE TRANSFUSION  Right after receiving a blood transfusion, you will usually feel much better and more energetic. This is especially true if your red blood cells have gotten low (anemic). The transfusion raises the level of the red blood cells which carry oxygen, and this usually causes an energy increase.  The nurse administering the transfusion will monitor you carefully for complications. HOME CARE INSTRUCTIONS  No special instructions are needed after a transfusion. You may find your energy is better. Speak with your caregiver about any limitations on activity for underlying diseases you may have. SEEK MEDICAL CARE IF:   Your condition is not improving after your transfusion.  You develop redness or irritation at the intravenous (IV) site. SEEK IMMEDIATE MEDICAL CARE IF:  Any of the following symptoms occur over the next 12 hours:  Shaking chills.  You have a temperature by mouth above 102 F (38.9 C), not controlled by medicine.  Chest, back, or muscle pain.  People around you feel you are not acting correctly or are confused.  Shortness of breath or difficulty breathing.  Dizziness and fainting.  You get a rash or develop hives.  You have a decrease in urine output.  Your urine turns a dark color or changes to pink, red, or brown. Any of the following   symptoms occur over the next 10 days:  You have a temperature by mouth above 102 F (38.9 C), not controlled by medicine.  Shortness of breath.  Weakness after normal activity.  The white part of the eye turns yellow (jaundice).  You have a decrease in the amount of urine or are urinating less often.  Your  urine turns a dark color or changes to pink, red, or brown. Document Released: 03/27/2000 Document Revised: 06/22/2011 Document Reviewed: 11/14/2007 Exit[employer] Patient Information 2014 [employer].  

## 2013-03-21 NOTE — Progress Notes (Signed)
Chaplain made follow-up visit with pt who was waiting in lobby. Pt stated that it had been a very long day here but she was relieved to get most of it over with. Pt said that she was going to see her husband and son for Christmas in their respective nursing homes. Chaplain offered emotional support and facilitated sharing.

## 2013-03-21 NOTE — Progress Notes (Signed)
[facility] OFFICE PROGRESS NOTE  Dana [name], MD [address] [place] [address]  DIAGNOSIS: Anemia of chronic disease  Antineoplastic chemotherapy induced pancytopenia - Plan: Type and screen, heparin lock flush 100 unit/mL, sodium chloride 0.9 % injection 3 mL, CANCELED: Care order/instruction, DISCONTINUED: 0.9 %  sodium chloride infusion, DISCONTINUED: sodium chloride 0.9 % injection 10 mL, DISCONTINUED: heparin lock flush 100 unit/mL, CANCELED: Prepare RBC, CANCELED: Transfuse RBC, DISCONTINUED: furosemide (LASIX) injection 20 mg, DISCONTINUED: acetaminophen (TYLENOL) tablet 650 mg, CANCELED: Prepare Pheresed Platelets, CANCELED: Transfuse Pheresed Platelets  Atrial fibrillation - Plan: Protime-INR, Protime-INR  Diffuse large B cell lymphoma - Plan: CBC with Differential, CBC with Differential, CT Chest W Contrast, CT Abdomen Pelvis W Contrast, Comprehensive metabolic panel  Non-Hodgkin's lymphoma of abdomen  Chief Complaint  Patient presents with  . 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   PAST THERAPY: q3wk R-CHOP x 6 cycles; finished in Dec 2012; followed by consolidative radiation therapy. She developed recurrent disease; started on bendamustine/Rituxan on 06/29/2012. She completed 3 cycles on 08/25/12. Stopped due to disease progression.   CURRENT THERAPY: Started salvage chemotherapy with R-GPD on 10/26/12 on q 3week cycles. Her last dose was on 03/14/2013 following 5 cycles. She received neupogen on day# 9.  She is scheduled for her 5 th cycle today.   INTERVAL HISTORY:  [name] 72 y.o. female returns for follow up visit. She was last seen by me on 02/28/2013. Today is she is accompanied by her nieces [name] an [name].  She reports doing well overall and has recently moved back to her home.  She reports a good appetite.  Of note, she had a 11-day hospitalization at [facility] (02/11/13 - 02/22/2013) secondary to febrile neutropenia.  Her infectious work-up was negative and her thrombocytopenia was prolonged.  She received antibiotics and plt transfusion.   She denies headaches or visual changes.  She reports a long history of cough.  She is a former longstanding smoker who quit over the past year.  She denies  without fever or chills.    She denied headaches, double vision, blurry vision, nasal discharge, hearing problems, odynophagia or dysphagia. No chest pain, palpitations, dyspnea, cough, abdominal pain, vomiting, diarrhea, constipation, hematochezia. The patient denied dysuria, nocturia, polyuria, hematuria, myalgia, numbness, tingling, psychiatric problems. She does report having nose bleeds that resolved with holding pressure for less than a minute and was related to her blowing her nose.    MEDICAL HISTORY: Past Medical History  Diagnosis Date  . Sick sinus syndrome   . Hematoma     At the site of the pacemaker insertion.  . Hypotension   . Dyslipidemia   . Pacemaker   . History of atrial fibrillation   . Arthritis     Osteoarthritis  . Port-a-cath in place 12/08/10  . Status post chemotherapy completed 03/2011    R - CHOP q 3 weeks x 6  . S/P radiation therapy 05/28/2011 - 06/26/2011    Right Abdomen and Right Pelvis/3060 cGy in 17 Fractions  . Diffuse large B cell lymphoma   . Cancer 10/2010    large B cell lymphoma  . 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 Hypertension   . ASHD (arteriosclerotic heart disease)   . Gout   . COPD (chronic obstructive pulmonary disease)   . Asthma   . Hyperlipidemia   . Diabetes mellitus   .  Hypothyroidism     INTERIM HISTORY: has Essential hypertension, benign; Atrial fibrillation; Cardiac pacemaker in situ; Diffuse large B cell lymphoma; Hypothyroidism; Diabetes mellitus type 2 in obese; Hyperlipidemia; Non-Hodgkin's lymphoma of abdomen; Depression; Fever and  neutropenia; Antineoplastic chemotherapy induced pancytopenia; Acute renal failure; Anemia of chronic disease; Chronic bronchitis; Hypertension; ASHD (arteriosclerotic heart disease); Gout; COPD (chronic obstructive pulmonary disease); Asthma; and Diabetes mellitus on her problem list.    ALLERGIES:  is allergic to codeine and penicillins.  MEDICATIONS: has a current medication list which includes the following prescription(s): albuterol, allopurinol, vitamin d3, citalopram, diltiazem, dss, levothyroxine, lidocaine-prilocaine, loperamide, magnesium oxide, metformin, ondansetron, potassium chloride, pravastatin, prochlorperazine, and warfarin, and the following Facility-Administered Medications: sodium chloride.  SURGICAL HISTORY:  Past Surgical History  Procedure Laterality Date  . Pacemaker insertion      Lead revision completed August 04, 2006  . Cholecystectomy    . Tubal ligation      Bilateral  . Insert / replace / remove pacemaker    . Abdominal hysterectomy    . Biopsy stomach  11/03/10    Soft Tissue Mass, Biopsy, Right Lower Quadrant Mesenteric Mass- High Grade Non-Hodgkins B Cell Lymphoma  with Flow Cytometry  . Bone biopsy  11/24/10    Bone Marrow, Aspirate Biospy, and clot, Left - No Involvement of Non-Hodgkin's Lymphoma Identified    REVIEW OF SYSTEMS:   Constitutional: Denies fevers, chills or abnormal weight loss Eyes: Denies blurriness of vision Ears, nose, mouth, throat, and face: Denies mucositis or sore throat Respiratory: Denies cough, dyspnea or wheezes Cardiovascular: Denies palpitation, chest discomfort or lower extremity swelling Gastrointestinal:  Denies nausea, heartburn or change in bowel habits Skin: Denies abnormal skin rashes Lymphatics: Denies new lymphadenopathy or easy bruising Neurological:Denies numbness, tingling or new weaknesses Behavioral/Psych: Mood is stable, no new changes  All other systems were reviewed with the patient and are  negative.  PHYSICAL EXAMINATION: ECOG PERFORMANCE STATUS: 2 - Symptomatic, <50% confined to bed  Blood pressure 114/57, pulse 82, temperature 98.1 F (36.7 C), temperature source Oral, resp. rate 20, height 4\' 8"  (1.422 m), weight 221 lb 11.2 oz (100.562 kg).  GENERAL:alert, no distress and comfortable;obese, sitting in wheelchair SKIN: skin color, texture, turgor are normal, no rashes or significant lesions; + petecheia EYES: normal, Conjunctiva are pink and non-injected, sclera clear OROPHARYNX:no exudate, no erythema and lips, buccal mucosa, and tongue normal  NECK: supple, thyroid normal size, non-tender, without nodularity LYMPH:  no palpable lymphadenopathy in the cervical, axillary or supraclavicular LUNGS: CTA/ BL with normal breathing effort HEART: slightly tachycardia and no murmurs and no lower extremity edema ABDOMEN: obese, abdomen soft, non-tender and normal bowel sounds Musculoskeletal:no cyanosis of digits and no clubbing  NEURO: alert & oriented x 3 with fluent speech, no focal motor/sensory deficits  LABORATORY DATA: Results for orders placed in visit on 03/21/13 (from the past 48 hour(s))  COMPREHENSIVE METABOLIC PANEL (CC13)     Status: None   Collection Time    03/21/13  8:36 AM      Result Value Range   Sodium 141  136 - 145 mEq/L   Potassium 4.7  3.5 - 5.1 mEq/L   Chloride 104  98 - 109 mEq/L   CO2 26  22 - 29 mEq/L   Glucose 106  70 - 140 mg/dl   BUN 04.5  7.0 - 40.9 mg/dL   Creatinine 1.0  0.6 - 1.1 mg/dL   Total Bilirubin 8.11  0.20 - 1.20 mg/dL   Alkaline Phosphatase  85  40 - 150 U/L   AST 14  5 - 34 U/L   ALT 14  0 - 55 U/L   Total Protein 6.4  6.4 - 8.3 g/dL   Albumin 3.6  3.5 - 5.0 g/dL   Calcium 9.9  8.4 - 16.1 mg/dL   Anion Gap 11  3 - 11 mEq/L  CBC WITH DIFFERENTIAL     Status: Abnormal   Collection Time    03/21/13  8:37 AM      Result Value Range   WBC 8.6  3.9 - 10.3 10e3/uL   NEUT# 7.2 (*) 1.5 - 6.5 10e3/uL   HGB 7.4 (*) 11.6 - 15.9  g/dL   HCT 09.6 (*) 04.5 - 40.9 %   Platelets 11 (*) 145 - 400 10e3/uL   MCV 98.4  79.5 - 101.0 fL   MCH 33.3  25.1 - 34.0 pg   MCHC 33.8  31.5 - 36.0 g/dL   RBC 8.11 (*) 9.14 - 7.82 10e6/uL   RDW 17.3 (*) 11.2 - 14.5 %   lymph# 0.9  0.9 - 3.3 10e3/uL   MONO# 0.4  0.1 - 0.9 10e3/uL   Eosinophils Absolute 0.0  0.0 - 0.5 10e3/uL   Basophils Absolute 0.0  0.0 - 0.1 10e3/uL   NEUT% 84.3 (*) 38.4 - 76.8 %   LYMPH% 10.6 (*) 14.0 - 49.7 %   MONO% 4.6  0.0 - 14.0 %   EOS% 0.1  0.0 - 7.0 %   BASO% 0.4  0.0 - 2.0 %    Labs:  Lab Results  Component Value Date   WBC 8.6 03/21/2013   HGB 7.4* 03/21/2013   HCT 21.8* 03/21/2013   MCV 98.4 03/21/2013   PLT 11* 03/21/2013   NEUTROABS 7.2* 03/21/2013      Chemistry      Component Value Date/Time   NA 141 03/21/2013 0836   NA 137 02/22/2013 0435   NA 141 10/12/2011 0957   K 4.7 03/21/2013 0836   K 4.1 02/22/2013 0435   K 4.4 10/12/2011 0957   CL 98 02/22/2013 0435   CL 102 09/21/2012 0917   CL 95* 10/12/2011 0957   CO2 26 03/21/2013 0836   CO2 29 02/22/2013 0435   CO2 29 10/12/2011 0957   BUN 19.7 03/21/2013 0836   BUN 25* 02/22/2013 0435   BUN 15 10/12/2011 0957   CREATININE 1.0 03/21/2013 0836   CREATININE 1.15* 02/22/2013 0435   CREATININE 0.9 10/12/2011 0957      Component Value Date/Time   CALCIUM 9.9 03/21/2013 0836   CALCIUM 9.5 02/22/2013 0435   CALCIUM 9.1 10/12/2011 0957   ALKPHOS 85 03/21/2013 0836   ALKPHOS 49 02/11/2013 2333   ALKPHOS 65 10/12/2011 0957   AST 14 03/21/2013 0836   AST 11 02/11/2013 2333   AST 19 10/12/2011 0957   ALT 14 03/21/2013 0836   ALT 19 02/11/2013 2333   ALT 20 10/12/2011 0957   BILITOT 0.31 03/21/2013 0836   BILITOT 0.5 02/11/2013 2333   BILITOT 0.60 10/12/2011 0957     Basic Metabolic Panel:  Recent Labs Lab 03/21/13 0836  NA 141  K 4.7  CO2 26  GLUCOSE 106  BUN 19.7  CREATININE 1.0  CALCIUM 9.9   GFR Estimated Creatinine Clearance: 49.8 ml/min (by C-G formula based on Cr of 1). Liver Function  Tests:  Recent Labs Lab 03/21/13 0836  AST 14  ALT 14  ALKPHOS 85  BILITOT 0.31  PROT  6.4  ALBUMIN 3.6   Coagulation profile No results found for this basename: INR, PROTIME,  in the last 168 hours  CBC:  Recent Labs Lab 03/21/13 0837  WBC 8.6  NEUTROABS 7.2*  HGB 7.4*  HCT 21.8*  MCV 98.4  PLT 11*    RADIOGRAPHIC STUDIES: Dg Chest 2 View  01/10/2013   CLINICAL DATA:  Cough. Dyspnea.  EXAM: CHEST  2 VIEW  COMPARISON:  01/01/2013  FINDINGS: Power port and a dual lead pacer are in place. Heart size and vascularity are normal and the lungs are clear. No effusions. No significant osseous abnormality.  IMPRESSION: No active cardiopulmonary disease.   Electronically Signed   By: Geanie Cooley   On: 01/10/2013 10:23   Dg Chest 2 View  01/01/2013   CLINICAL DATA:  Chest pain and cough  EXAM: CHEST  2 VIEW  COMPARISON:  Chest radiograph April 05, 2011 and chest CT December 23, 2012  FINDINGS: Lungs are clear. Heart is upper normal in size with normal pulmonary vascularity. No adenopathy. Port-A-Cath tip is in the superior vena cava near the cavoatrial junction. Pacemaker leads are attached to the right atrium and middle cardiac vein. No pneumothorax. .  IMPRESSION: No edema or consolidation.   Electronically Signed   By: Bretta Bang   On: 01/01/2013 17:46    ASSESSMENT: [name] 72 y.o. female with a history of Anemia of chronic disease  Antineoplastic chemotherapy induced pancytopenia - Plan: Type and screen, heparin lock flush 100 unit/mL, sodium chloride 0.9 % injection 3 mL, CANCELED: Care order/instruction, DISCONTINUED: 0.9 %  sodium chloride infusion, DISCONTINUED: sodium chloride 0.9 % injection 10 mL, DISCONTINUED: heparin lock flush 100 unit/mL, CANCELED: Prepare RBC, CANCELED: Transfuse RBC, DISCONTINUED: furosemide (LASIX) injection 20 mg, DISCONTINUED: acetaminophen (TYLENOL) tablet 650 mg, CANCELED: Prepare Pheresed Platelets, CANCELED: Transfuse Pheresed  Platelets  Atrial fibrillation - Plan: Protime-INR, Protime-INR  Diffuse large B cell lymphoma - Plan: CBC with Differential, CBC with Differential, CT Chest W Contrast, CT Abdomen Pelvis W Contrast, Comprehensive metabolic panel  Non-Hodgkin's lymphoma of abdomen  PLAN:  1. Relapsed high grade non-hodgkin's B cell Lymphoma on salvage therapy   -Chemotherapy reference: Relapsed high grade non-Hodgkin's B cell lymphoma on salvage therapy with DGP Harrold Donath M et. Al, Cancer 2004) + Rituximab  - She completed 5 cycles of R+DGP q 21 days (modified doses based on dehydration, electrolyte abnormalities) and last visit we reviewed the CT Scans of C/A/P which was consistent with a mild  increase in retroperitoneal lymphadenopathy. She is still recovering from her prolonged hospitalization from neutropenia fever (11/01 -11/12) and has nasal congestion and chronic cough likely secondary to chronic bronchitis.  We delayed her chemotherapy until today based on the above noted.     --We will completed R+DGP for cycles # 5 on 03/14/2013 followed by neulasta (12/03) based on her prior high risk of infection and prior episode of febrile neutropenia.   --We will repeat scans to assess response on 03/28/13.  Given her significant co-morbidities, I favor against bone marrow transplant.   Again, patient was counseled on the chemotherapy plan including its indications to control her disease and the risks including but not limited to myelotoxicity which can result in life-threatening infections, nephrotoxicity, ototoxicity and neurotoxicity, nausea/vomiting, electrolyte abnormalities and fatigue.  She understood the benefits and risks and consented to proceed with therapy.    2.  Anemia/Thrombocytopenia secondary chemotherapy -- We will transfuse one unit of packed RBCs (hbg 7.4 today) and  plt (11) today.   Repeat labs on Friday 03/24/2013. She is having epistaxis.   We also counseled her to do saline gel to nose as a  preventive agent.   3. INR/ A fib.  -- She was instructed to hold her coumadin since her plts are less than 50K.  We will recheck on Friday.   4. Elevated creatinine, resolved.  --Creatinine is 1.0 today (Range 1.1 to 1.6 over the past one year). We encouraged aggressive hydration and avoidance of nephrotoxins.   We will continue to closely monitor her labs weekly.   5. Hypothyroidism: Levothyroxine per PCP  6.  Hypertension: Diltiazem per PCP.   7. Diabetes mellitus, type II: Metformin per PCP.   8. Hyperlipidemia: Pravastatin per PCP.   9. History of atrial fibrillation: Diltiazem, pacemaker. Coumadin being managed per her PCP.  10. Follow-up on 12/18 to discuss CT of C/A/P.  We will obtain CBC, CMP.  CT of CAP on 12/16.   All questions were answered. The patient knows to call the clinic with any problems, questions or concerns. We can certainly see the patient much sooner if necessary.  I spent 15 minutes counseling the patient face to face. The total time spent in the appointment was 25 minutes.    Jenet Durio, MD 03/22/2013 8:34 AM

## 2013-03-21 NOTE — Telephone Encounter (Signed)
gv and printed appt sched and avs for pt for DEC....sent pt to lab...will add more labs after template finished.

## 2013-03-22 ENCOUNTER — Telehealth: Payer: Self-pay | Admitting: Internal Medicine

## 2013-03-22 LAB — TYPE AND SCREEN
ABO/RH(D): B POS
Antibody Screen: NEGATIVE

## 2013-03-22 LAB — PREPARE PLATELET PHERESIS: Unit division: 0

## 2013-03-22 NOTE — Telephone Encounter (Signed)
s.w. pt and advised on 12.12.14 appt and 12.18 pt ok and aware

## 2013-03-23 ENCOUNTER — Encounter: Payer: Self-pay | Admitting: Internal Medicine

## 2013-03-23 NOTE — Progress Notes (Signed)
Ct chest @ [facility]  03/27/13 Berkley Harvey [id number] ab/pel  [id number]

## 2013-03-24 ENCOUNTER — Other Ambulatory Visit (HOSPITAL_BASED_OUTPATIENT_CLINIC_OR_DEPARTMENT_OTHER): Payer: Medicare Other

## 2013-03-24 DIAGNOSIS — C8583 Other specified types of non-Hodgkin lymphoma, intra-abdominal lymph nodes: Secondary | ICD-10-CM

## 2013-03-24 DIAGNOSIS — I4891 Unspecified atrial fibrillation: Secondary | ICD-10-CM

## 2013-03-24 DIAGNOSIS — C8330 Diffuse large B-cell lymphoma, unspecified site: Secondary | ICD-10-CM

## 2013-03-24 LAB — CBC WITH DIFFERENTIAL/PLATELET
Basophils Absolute: 0.1 10*3/uL (ref 0.0–0.1)
EOS%: 0.3 % (ref 0.0–7.0)
Eosinophils Absolute: 0 10*3/uL (ref 0.0–0.5)
HCT: 28.5 % — ABNORMAL LOW (ref 34.8–46.6)
LYMPH%: 9.4 % — ABNORMAL LOW (ref 14.0–49.7)
MCH: 31.8 pg (ref 25.1–34.0)
MCV: 94.3 fL (ref 79.5–101.0)
MONO%: 10 % (ref 0.0–14.0)
NEUT%: 79.8 % — ABNORMAL HIGH (ref 38.4–76.8)
RBC: 3.02 10*6/uL — ABNORMAL LOW (ref 3.70–5.45)
RDW: 19.4 % — ABNORMAL HIGH (ref 11.2–14.5)

## 2013-03-24 LAB — PROTIME-INR
INR: 1.2 — ABNORMAL LOW (ref 2.00–3.50)
Protime: 14.4 Seconds — ABNORMAL HIGH (ref 10.6–13.4)

## 2013-03-24 NOTE — Progress Notes (Signed)
S/w pt in lobby after lab resulted. Per Dr Rosie Fate instructed pt to continue holding coumadin. Also if she gets signs of bleeding to go to ER. Platelets are 33. Pt confirmed appts 12/15 for scan and 12/18 for MD.

## 2013-03-27 ENCOUNTER — Ambulatory Visit (HOSPITAL_COMMUNITY)
Admission: RE | Admit: 2013-03-27 | Discharge: 2013-03-27 | Disposition: A | Discharge status: Home / Self Care | Payer: Medicare Other | Source: Ambulatory Visit | Attending: Internal Medicine | Admitting: Internal Medicine

## 2013-03-27 ENCOUNTER — Telehealth: Payer: Self-pay | Admitting: *Deleted

## 2013-03-27 ENCOUNTER — Encounter (HOSPITAL_COMMUNITY): Payer: Self-pay

## 2013-03-27 DIAGNOSIS — C8589 Other specified types of non-Hodgkin lymphoma, extranodal and solid organ sites: Secondary | ICD-10-CM | POA: Insufficient documentation

## 2013-03-27 DIAGNOSIS — K5730 Diverticulosis of large intestine without perforation or abscess without bleeding: Secondary | ICD-10-CM | POA: Insufficient documentation

## 2013-03-27 DIAGNOSIS — C8330 Diffuse large B-cell lymphoma, unspecified site: Secondary | ICD-10-CM

## 2013-03-27 DIAGNOSIS — R599 Enlarged lymph nodes, unspecified: Secondary | ICD-10-CM | POA: Insufficient documentation

## 2013-03-27 MED ORDER — IOHEXOL 300 MG/ML  SOLN
100.0000 mL | Freq: Once | INTRAMUSCULAR | Status: AC | PRN
  Administered 2013-03-27: 100 mL via INTRAVENOUS

## 2013-03-27 MED ORDER — IOHEXOL 300 MG/ML  SOLN
50.0000 mL | Freq: Once | INTRAMUSCULAR | Status: AC | PRN
  Administered 2013-03-27: 50 mL via ORAL

## 2013-03-27 NOTE — Telephone Encounter (Signed)
Spoke with Dr. Rosie Fate: He will call patient after CT results are available and call in antibiotic if infectious process is shown. She needs to continue her inhalers and push po fluids. She agrees to take Robitussin OTC prn and call for fever.

## 2013-03-27 NOTE — Telephone Encounter (Signed)
Presents to lobby today after completion of CT scan chest at [facility]. Reports change in her chronic cough since Friday. Cough is "deep, and congested". Reports sputum clear to yellow. No fever (temp 98.6 in office). No dyspnea other than related to her COPD-still uses her HHN daily. Also sounds as if she has sinus and nasal congestion. Nurse can hear her cough is moist in nature. She has not tried any OTC preparations except cough drops.  RN told her this is probably viral and she needs to push fluids and take OTC expectorant. Should call if she develops fever. She requests that RN notify MD she is here before she will leave.

## 2013-03-28 ENCOUNTER — Ambulatory Visit (HOSPITAL_COMMUNITY): Payer: Medicare Other

## 2013-03-30 ENCOUNTER — Other Ambulatory Visit: Payer: Medicare Other

## 2013-03-30 ENCOUNTER — Other Ambulatory Visit (HOSPITAL_BASED_OUTPATIENT_CLINIC_OR_DEPARTMENT_OTHER): Payer: Medicare Other

## 2013-03-30 ENCOUNTER — Ambulatory Visit (HOSPITAL_BASED_OUTPATIENT_CLINIC_OR_DEPARTMENT_OTHER): Payer: Medicare Other | Admitting: Internal Medicine

## 2013-03-30 ENCOUNTER — Telehealth: Payer: Self-pay | Admitting: Internal Medicine

## 2013-03-30 VITALS — BP 111/57 | HR 81 | Temp 98.4°F | Resp 18 | Ht <= 58 in | Wt 223.0 lb

## 2013-03-30 DIAGNOSIS — D6481 Anemia due to antineoplastic chemotherapy: Secondary | ICD-10-CM

## 2013-03-30 DIAGNOSIS — C8593 Non-Hodgkin lymphoma, unspecified, intra-abdominal lymph nodes: Secondary | ICD-10-CM

## 2013-03-30 DIAGNOSIS — I4891 Unspecified atrial fibrillation: Secondary | ICD-10-CM

## 2013-03-30 DIAGNOSIS — D638 Anemia in other chronic diseases classified elsewhere: Secondary | ICD-10-CM

## 2013-03-30 DIAGNOSIS — C8330 Diffuse large B-cell lymphoma, unspecified site: Secondary | ICD-10-CM

## 2013-03-30 DIAGNOSIS — C8589 Other specified types of non-Hodgkin lymphoma, extranodal and solid organ sites: Secondary | ICD-10-CM

## 2013-03-30 DIAGNOSIS — Z7901 Long term (current) use of anticoagulants: Secondary | ICD-10-CM

## 2013-03-30 DIAGNOSIS — J449 Chronic obstructive pulmonary disease, unspecified: Secondary | ICD-10-CM

## 2013-03-30 DIAGNOSIS — D61810 Antineoplastic chemotherapy induced pancytopenia: Secondary | ICD-10-CM

## 2013-03-30 DIAGNOSIS — D6959 Other secondary thrombocytopenia: Secondary | ICD-10-CM

## 2013-03-30 DIAGNOSIS — C8583 Other specified types of non-Hodgkin lymphoma, intra-abdominal lymph nodes: Secondary | ICD-10-CM

## 2013-03-30 LAB — CBC WITH DIFFERENTIAL/PLATELET
Basophils Absolute: 0 10*3/uL (ref 0.0–0.1)
EOS%: 1.5 % (ref 0.0–7.0)
HGB: 9 g/dL — ABNORMAL LOW (ref 11.6–15.9)
MCH: 32.2 pg (ref 25.1–34.0)
MCV: 98.2 fL (ref 79.5–101.0)
MONO#: 1.7 10*3/uL — ABNORMAL HIGH (ref 0.1–0.9)
MONO%: 14.3 % — ABNORMAL HIGH (ref 0.0–14.0)
NEUT%: 74.1 % (ref 38.4–76.8)
Platelets: 202 10*3/uL (ref 145–400)
RDW: 20.8 % — ABNORMAL HIGH (ref 11.2–14.5)
WBC: 11.7 10*3/uL — ABNORMAL HIGH (ref 3.9–10.3)

## 2013-03-30 LAB — COMPREHENSIVE METABOLIC PANEL (CC13)
AST: 17 U/L (ref 5–34)
Albumin: 3.9 g/dL (ref 3.5–5.0)
Alkaline Phosphatase: 86 U/L (ref 40–150)
Anion Gap: 11 mEq/L (ref 3–11)
BUN: 9.6 mg/dL (ref 7.0–26.0)
Chloride: 105 mEq/L (ref 98–109)
Creatinine: 1.3 mg/dL — ABNORMAL HIGH (ref 0.6–1.1)
Glucose: 114 mg/dl (ref 70–140)
Potassium: 4.3 mEq/L (ref 3.5–5.1)
Total Bilirubin: 0.35 mg/dL (ref 0.20–1.20)

## 2013-03-30 LAB — PROTIME-INR
INR: 1.1 — ABNORMAL LOW (ref 2.00–3.50)
Protime: 13.2 Seconds (ref 10.6–13.4)

## 2013-03-30 MED ORDER — ALBUTEROL SULFATE (2.5 MG/3ML) 0.083% IN NEBU
2.5000 mg | INHALATION_SOLUTION | Freq: Once | RESPIRATORY_TRACT | Status: AC
  Administered 2013-03-30: 2.5 mg via RESPIRATORY_TRACT
  Filled 2013-03-30: qty 3

## 2013-03-30 MED ORDER — TIOTROPIUM BROMIDE MONOHYDRATE 18 MCG IN CAPS: 18.0000 ug | ORAL_CAPSULE | Freq: Every day | RESPIRATORY_TRACT | Status: DC

## 2013-03-30 MED ORDER — ALBUTEROL SULFATE (5 MG/ML) 0.5% IN NEBU
INHALATION_SOLUTION | RESPIRATORY_TRACT | Status: AC
  Filled 2013-03-30: qty 0.5

## 2013-03-30 NOTE — Progress Notes (Signed)
[facility] OFFICE PROGRESS NOTE  MCKEOWN,WILLIAM Letta Cargile, MD [address] [place] [address]  DIAGNOSIS: Anemia of chronic disease  Antineoplastic chemotherapy induced pancytopenia  Atrial fibrillation  Diffuse large B cell lymphoma - Plan: CBC with Differential, Comprehensive metabolic panel (Cmet) - [facility], Lactate dehydrogenase (LDH) - [facility], Protime-INR  Non-Hodgkin's lymphoma of abdomen  COPD (chronic obstructive pulmonary disease) - Plan: albuterol (PROVENTIL) (2.5 MG/3ML) 0.083% nebulizer solution 2.5 mg  Chief Complaint  Patient presents with  . 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   PAST THERAPY: q3wk R-CHOP x 6 cycles; finished in Dec 2012; followed by consolidative radiation therapy. She developed recurrent disease; started on bendamustine/Rituxan on 06/29/2012. She completed 3 cycles on 08/25/12. Stopped due to disease progression.   CURRENT THERAPY: Started salvage chemotherapy with R-GPD on 10/26/12 on q 3week cycles. Her last dose was on 03/14/2013 following 5 cycles. She received neupogen on day# 9.    INTERVAL HISTORY:  [name] 72 y.o. female returns for follow up visit. She was last seen by me on 03/21/2013. Today is she is accompanied by her niece [name].  She complains of persistence and increase in her cough since 12/15.  She had the CT of Chest and abdomen and pelvis on 12/15.  She denies any fevers or chills.  She reports a good appetite.    As noted previously, she had a 11-day hospitalization at [facility] (02/11/13 - 02/22/2013) secondary to febrile neutropenia.  Her infectious work-up was negative and her thrombocytopenia was prolonged.  She received antibiotics and plt transfusion.   She denies headaches or visual changes.   Regarding her cough, she reports a long history of cough.  She is a former longstanding smoker who quit over the past year.  She  denies  without fever or chills.    She denied headaches, double vision, blurry vision, nasal discharge, hearing problems, odynophagia or dysphagia. No chest pain, palpitations, dyspnea, cough, abdominal pain, vomiting, diarrhea, constipation, hematochezia. The patient denied dysuria, nocturia, polyuria, hematuria, myalgia, numbness, tingling, psychiatric problems. She denies any further bleeding episodes including epitaxis.   MEDICAL HISTORY: Past Medical History  Diagnosis Date  . Sick sinus syndrome   . Hematoma     At the site of the pacemaker insertion.  . Hypotension   . Dyslipidemia   . Pacemaker   . History of atrial fibrillation   . Arthritis     Osteoarthritis  . Port-a-cath in place 12/08/10  . Status post chemotherapy completed 03/2011    R - CHOP q 3 weeks x 6  . S/P radiation therapy 05/28/2011 - 06/26/2011    Right Abdomen and Right Pelvis/3060 cGy in 17 Fractions  . Diffuse large B cell lymphoma   . Cancer 10/2010    large B cell lymphoma  . 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 Hypertension   . ASHD (arteriosclerotic heart disease)   . Gout   . COPD (chronic obstructive pulmonary disease)   . Asthma   . Hyperlipidemia   . Diabetes mellitus   . Hypothyroidism     INTERIM HISTORY: has Essential hypertension, benign; Atrial fibrillation; Cardiac pacemaker in situ; Diffuse large B cell lymphoma; Hypothyroidism; Diabetes mellitus type 2 in obese; Hyperlipidemia; Non-Hodgkin's lymphoma of abdomen; Depression; Fever and neutropenia; Antineoplastic chemotherapy induced pancytopenia; Acute renal failure; Anemia of chronic disease; Chronic bronchitis; Hypertension; ASHD (arteriosclerotic heart disease); Gout; COPD (  chronic obstructive pulmonary disease); Asthma; and Diabetes mellitus on her problem list.    ALLERGIES:  is allergic to codeine and penicillins.  MEDICATIONS: has a current medication list which includes the following  prescription(s): albuterol, allopurinol, vitamin d3, citalopram, diltiazem, dss, levothyroxine, lidocaine-prilocaine, loperamide, magnesium oxide, metformin, ondansetron, potassium chloride, pravastatin, prochlorperazine, and tiotropium, and the following Facility-Administered Medications: albuterol and sodium chloride.  SURGICAL HISTORY:  Past Surgical History  Procedure Laterality Date  . Pacemaker insertion      Lead revision completed August 04, 2006  . Cholecystectomy    . Tubal ligation      Bilateral  . Insert / replace / remove pacemaker    . Abdominal hysterectomy    . Biopsy stomach  11/03/10    Soft Tissue Mass, Biopsy, Right Lower Quadrant Mesenteric Mass- High Grade Non-Hodgkins B Cell Lymphoma  with Flow Cytometry  . Bone biopsy  11/24/10    Bone Marrow, Aspirate Biospy, and clot, Left - No Involvement of Non-Hodgkin's Lymphoma Identified    REVIEW OF SYSTEMS:   Constitutional: Denies fevers, chills or abnormal weight loss Eyes: Denies blurriness of vision Ears, nose, mouth, throat, and face: Denies mucositis or sore throat Respiratory: Positive for longstanding cough, but denies dyspnea or wheezes Cardiovascular: Denies palpitation, chest discomfort or lower extremity swelling Gastrointestinal:  Denies nausea, heartburn or change in bowel habits Skin: Denies abnormal skin rashes Lymphatics: Denies new lymphadenopathy or easy bruising Neurological:Denies numbness, tingling or new weaknesses Behavioral/Psych: Mood is stable, no new changes  All other systems were reviewed with the patient and are negative.  PHYSICAL EXAMINATION: ECOG PERFORMANCE STATUS: 2 - Symptomatic, <50% confined to bed  Blood pressure 111/57, pulse 81, temperature 98.4 F (36.9 C), temperature source Oral, resp. rate 18, height 4\' 8"  (1.422 m), weight 223 lb (101.152 kg).  GENERAL:alert, no distress and comfortable;obese, sitting in wheelchair SKIN: skin color, texture, turgor are normal, no  rashes or significant lesions; + petecheia EYES: normal, Conjunctiva are pink and non-injected, sclera clear OROPHARYNX:no exudate, no erythema and lips, buccal mucosa, and tongue normal  NECK: supple, thyroid normal size, non-tender, without nodularity LYMPH:  no palpable lymphadenopathy in the cervical, axillary or supraclavicular LUNGS: + Wheezes and chest tightness with decrease breath sounds at the bases with normal breathing effort (exam repeated following albuterol treatment and was much clearer with improved aeration).  HEART: slightly tachycardia and no murmurs and no lower extremity edema ABDOMEN: obese, abdomen soft, non-tender and normal bowel sounds Musculoskeletal:no cyanosis of digits and no clubbing  NEURO: alert & oriented x 3 with fluent speech, no focal motor/sensory deficits  LABORATORY DATA: Results for orders placed in visit on 03/30/13 (from the past 48 hour(s))  CBC WITH DIFFERENTIAL     Status: Abnormal   Collection Time    03/30/13 11:42 AM      Result Value Range   WBC 11.7 (*) 3.9 - 10.3 10e3/uL   NEUT# 8.7 (*) 1.5 - 6.5 10e3/uL   HGB 9.0 (*) 11.6 - 15.9 g/dL   HCT 40.9 (*) 81.1 - 91.4 %   Platelets 202  145 - 400 10e3/uL   MCV 98.2  79.5 - 101.0 fL   MCH 32.2  25.1 - 34.0 pg   MCHC 32.8  31.5 - 36.0 g/dL   RBC 7.82 (*) 9.56 - 2.13 10e6/uL   RDW 20.8 (*) 11.2 - 14.5 %   lymph# 1.1  0.9 - 3.3 10e3/uL   MONO# 1.7 (*) 0.1 - 0.9 10e3/uL  Eosinophils Absolute 0.2  0.0 - 0.5 10e3/uL   Basophils Absolute 0.0  0.0 - 0.1 10e3/uL   NEUT% 74.1  38.4 - 76.8 %   LYMPH% 9.7 (*) 14.0 - 49.7 %   MONO% 14.3 (*) 0.0 - 14.0 %   EOS% 1.5  0.0 - 7.0 %   BASO% 0.4  0.0 - 2.0 %  PROTIME-INR     Status: Abnormal   Collection Time    03/30/13 11:42 AM      Result Value Range   Protime 13.2  10.6 - 13.4 Seconds   INR 1.10 (*) 2.00 - 3.50   Comment: INR is useful only to assess adequacy of anticoagulation with coumadin when comparing results from different labs. It should  not be used to estimate bleeding risk or presence/abscense of coagulopathy in patients not on coumadin. Expected INR ranges for      nontherapeutic patients is 0.88 - 1.12.   Lovenox No    COMPREHENSIVE METABOLIC PANEL (CC13)     Status: Abnormal   Collection Time    03/30/13 11:42 AM      Result Value Range   Sodium 142  136 - 145 mEq/L   Potassium 4.3  3.5 - 5.1 mEq/L   Chloride 105  98 - 109 mEq/L   CO2 26  22 - 29 mEq/L   Glucose 114  70 - 140 mg/dl   BUN 9.6  7.0 - 45.4 mg/dL   Creatinine 1.3 (*) 0.6 - 1.1 mg/dL   Total Bilirubin 0.98  0.20 - 1.20 mg/dL   Alkaline Phosphatase 86  40 - 150 U/L   AST 17  5 - 34 U/L   ALT 9  0 - 55 U/L   Total Protein 6.8  6.4 - 8.3 g/dL   Albumin 3.9  3.5 - 5.0 g/dL   Calcium 9.8  8.4 - 11.9 mg/dL   Anion Gap 11  3 - 11 mEq/L    Labs:  Lab Results  Component Value Date   WBC 11.7* 03/30/2013   HGB 9.0* 03/30/2013   HCT 27.5* 03/30/2013   MCV 98.2 03/30/2013   PLT 202 03/30/2013   NEUTROABS 8.7* 03/30/2013      Chemistry      Component Value Date/Time   NA 142 03/30/2013 1142   NA 137 02/22/2013 0435   NA 141 10/12/2011 0957   K 4.3 03/30/2013 1142   K 4.1 02/22/2013 0435   K 4.4 10/12/2011 0957   CL 98 02/22/2013 0435   CL 102 09/21/2012 0917   CL 95* 10/12/2011 0957   CO2 26 03/30/2013 1142   CO2 29 02/22/2013 0435   CO2 29 10/12/2011 0957   BUN 9.6 03/30/2013 1142   BUN 25* 02/22/2013 0435   BUN 15 10/12/2011 0957   CREATININE 1.3* 03/30/2013 1142   CREATININE 1.15* 02/22/2013 0435   CREATININE 0.9 10/12/2011 0957      Component Value Date/Time   CALCIUM 9.8 03/30/2013 1142   CALCIUM 9.5 02/22/2013 0435   CALCIUM 9.1 10/12/2011 0957   ALKPHOS 86 03/30/2013 1142   ALKPHOS 49 02/11/2013 2333   ALKPHOS 65 10/12/2011 0957   AST 17 03/30/2013 1142   AST 11 02/11/2013 2333   AST 19 10/12/2011 0957   ALT 9 03/30/2013 1142   ALT 19 02/11/2013 2333   ALT 20 10/12/2011 0957   BILITOT 0.35 03/30/2013 1142   BILITOT 0.5 02/11/2013 2333    BILITOT 0.60 10/12/2011 0957  Basic Metabolic Panel:  Recent Labs Lab 03/30/13 1142  NA 142  K 4.3  CO2 26  GLUCOSE 114  BUN 9.6  CREATININE 1.3*  CALCIUM 9.8   GFR Estimated Creatinine Clearance: 38.5 ml/min (by C-G formula based on Cr of 1.3). Liver Function Tests:  Recent Labs Lab 03/30/13 1142  AST 17  ALT 9  ALKPHOS 86  BILITOT 0.35  PROT 6.8  ALBUMIN 3.9   Coagulation profile  Recent Labs Lab 03/24/13 0930 03/30/13 1142  INR 1.20* 1.10*  PROTIME 14.4* 13.2    CBC:  Recent Labs Lab 03/24/13 0930 03/30/13 1142  WBC 15.7* 11.7*  NEUTROABS 12.5* 8.7*  HGB 9.6* 9.0*  HCT 28.5* 27.5*  MCV 94.3 98.2  PLT 33* 202    RADIOGRAPHIC STUDIES: (Personally reviewed by me) (03/27/2014)CT CHEST, ABDOMEN, AND PELVIS WITH CONTRAST TECHNIQUE: Multidetector CT imaging of the chest, abdomen and pelvis was performed following the standard protocol during bolus  administration of intravenous contrast. CONTRAST: OMNIPAQUE IOHEXOL 300 MG/ML SOLN, 50mL OMNIPAQUE IOHEXOL 300 MG/ML SOLN COMPARISON: CT of the chest, abdomen and pelvis 12/23/2012. FINDINGS: CT CHEST FINDINGS Mediastinum: Heart size is mildly enlarged. Left-sided pacemaker device in place with lead tips terminating in the right atrial appendage and right ventricular apex. There is no significant pericardial fluid, thickening or pericardial calcification. No pathologically enlarged mediastinal or hilar lymph nodes. Multiple prominent but nonenlarged anterior mediastinal lymph nodes appear similar to prior examinations and are highly nonspecific. Esophagus is unremarkable in appearance. Right subclavian single-lumen porta cath with tip terminating in the right atrium. Lungs/Pleura: No acute consolidative airspace disease. No pleural effusions. No suspicious appearing pulmonary nodules or masses are identified at this time. Musculoskeletal: There are no aggressive appearing lytic or blastic lesions noted in the  visualized portions of the skeleton. No axillary lymphadenopathy. CT ABDOMEN AND PELVIS FINDINGS Abdomen/Pelvis: Status postcholecystectomy. The appearance of the liver, pancreas, spleen, bilateral adrenal glands and bilateral kidneys is unremarkable. Previously noted retroperitoneal lymphadenopathy appear slightly increased compared to the prior study. This is best demonstrated on image 66 of series 2 where there  is a nodal mass intimately associated with both the infrarenal abdominal aorta and the inferior vena cava measuring approximately 4.5 x 2.0 cm. More inferiorly, on the left side of the infrarenal abdominal aorta there is also an enlarging lymph node measuring 1.8 x 1.5 cm (image 73 of series 2). A significant amount of amorphous soft tissue (presumably nodal tissue) surrounds much of the infrarenal abdominal aorta and intrahepatic inferior vena cava, similar to prior studies. An enlarged ileocolic lymph node is  similar to the prior examination, currently measuring 2.1 x 1.8 cm (image 73 of series 2). There are few scattered colonic diverticulae, without surrounding inflammatory changes to suggest an acute diverticulitis at this time. No significant volume of ascites. No pneumoperitoneum. No pathologic distention of small bowel. Status post hysterectomy. Ovaries are not confidently identified may be surgically absent or atrophic. Urinary bladder is nearly completely decompressed, but otherwise unremarkable in appearance. Musculoskeletal: There are no aggressive appearing lytic or blastic  lesions noted in the visualized portions of the skeleton. IMPRESSION: 1. Retroperitoneal disease appears slightly progressed compared to the prior study, with increasing periaortic/pericaval  lymphadenopathy, as discussed above. 2. Enlarged ileocolic lymph node is essentially unchanged compared to the prior study. 3. No new lymphadenopathy is otherwise identified on today's study.  4. Colonic diverticulosis without  findings to suggest acute diverticulitis at this time. 5. Additional incidental findings, as above.  ASSESSMENT: [name] 72 y.o. female with a history of Anemia of chronic disease  Antineoplastic chemotherapy induced pancytopenia  Atrial fibrillation  Diffuse large B cell lymphoma - Plan: CBC with Differential, Comprehensive metabolic panel (Cmet) - [facility], Lactate dehydrogenase (LDH) - [facility], Protime-INR  Non-Hodgkin's lymphoma of abdomen  COPD (chronic obstructive pulmonary disease) - Plan: albuterol (PROVENTIL) (2.5 MG/3ML) 0.083% nebulizer solution 2.5 mg  PLAN:  1. Relapsed high grade non-hodgkin's B cell Lymphoma on salvage therapy   -Chemotherapy reference: Relapsed high grade non-Hodgkin's B cell lymphoma on salvage therapy with DGP Harrold Donath M et. Al, Cancer 2004) + Rituximab  - She completed 5 cycles of R+DGP q 21 days (modified doses based on dehydration, electrolyte abnormalities) and we reviewed the CT Scans of C/A/P which was consistent with a further mild  increase in retroperitoneal lymphadenopathy. She completed R+DGP for cycles # 5 on 03/14/2013 followed by neulasta (12/03) based on her prior high risk of infection and prior episode of febrile neutropenia. Given her significant co-morbidities, I favor against bone marrow transplant.  We will consider next therapy R-CEEP (dose reduced).   2.  Anemia/Thrombocytopenia secondary chemotherapy -- Transfused one unit of packed RBCs (hbg 7.4 last visit) and plt (11).   Her plts have recoverd.  She denies having epistaxis.   We also counseled her to do saline gel to nose as a preventive agent. She is asymptomatic for anemia.   3. INR/ A fib.  -- She was instructed resume her coumadin since her plts are greater than 50K. INR as noted above.  She will follow with the coumadin clinic.   4. Elevated creatinine, resolved.  --Creatinine is 1.3 today (Range 1.1 to 1.6 over the past one year). We encouraged aggressive hydration and  avoidance of nephrotoxins.   We will continue to closely monitor her labs weekly.   5. Hypothyroidism: Levothyroxine per PCP  6.  Hypertension: Diltiazem per PCP.   7. Diabetes mellitus, type II: Metformin per PCP.   8. Hyperlipidemia: Pravastatin per PCP.   9. History of atrial fibrillation: Diltiazem, pacemaker. Coumadin being managed per her PCP.  10. COPD/ Chronic Bronchitis exacerbation. -- Time spent setting up Albuterol to be administered in clinic given her physical exam findings.  Spiriva prescribed by me (with discussion of indications and common side-effects including but not limited to chest pain, dyspepsia, abdominal pain, headache, vomiting, edema) with close follow up recommended with her PCP and possible referral to pulmonary prn.  Patient advised to take albuterol q 6 hours as needed.    11. Follow-up in 3 weeks to treatment options once counts recovered and chronic bronchitis exacerbation resolved.  We will obtain CBC, CMP.    All questions were answered. The patient knows to call the clinic with any problems, questions or concerns. We can certainly see the patient much sooner if necessary.  I spent 25 minutes counseling the patient face to face. The total time spent in the appointment was 40 minutes.    Julieana Eshleman, MD 03/30/2013 1:14 PM

## 2013-03-30 NOTE — Patient Instructions (Signed)
1. Referral to PCP/Pulmo for chronic bronchitis  2. Start spiriva 3. RTC in 3 weeks to discuss next steps for lymphoma treatment.  4. Scans were reviewed.

## 2013-03-30 NOTE — Telephone Encounter (Signed)
appts made per 12/18 POF AVS and CAL given shh °

## 2013-04-10 ENCOUNTER — Other Ambulatory Visit: Payer: Self-pay | Admitting: *Deleted

## 2013-04-14 ENCOUNTER — Other Ambulatory Visit: Payer: Self-pay | Admitting: Emergency Medicine

## 2013-04-14 MED ORDER — ALBUTEROL SULFATE (2.5 MG/3ML) 0.083% IN NEBU: 2.5000 mg | INHALATION_SOLUTION | Freq: Four times a day (QID) | RESPIRATORY_TRACT | Status: AC | PRN

## 2013-04-17 ENCOUNTER — Encounter: Payer: Self-pay | Admitting: Internal Medicine

## 2013-04-17 DIAGNOSIS — I495 Sick sinus syndrome: Secondary | ICD-10-CM

## 2013-04-19 ENCOUNTER — Encounter: Payer: Self-pay | Admitting: Emergency Medicine

## 2013-04-19 ENCOUNTER — Other Ambulatory Visit: Payer: Self-pay | Admitting: Internal Medicine

## 2013-04-19 ENCOUNTER — Ambulatory Visit (INDEPENDENT_AMBULATORY_CARE_PROVIDER_SITE_OTHER): Payer: Medicare Other | Admitting: Emergency Medicine

## 2013-04-19 VITALS — BP 124/62 | HR 82 | Temp 98.2°F | Resp 20 | Ht <= 58 in | Wt 218.0 lb

## 2013-04-19 DIAGNOSIS — R6889 Other general symptoms and signs: Secondary | ICD-10-CM

## 2013-04-19 DIAGNOSIS — R0602 Shortness of breath: Secondary | ICD-10-CM

## 2013-04-19 DIAGNOSIS — R059 Cough, unspecified: Secondary | ICD-10-CM

## 2013-04-19 DIAGNOSIS — R05 Cough: Secondary | ICD-10-CM

## 2013-04-19 DIAGNOSIS — I4891 Unspecified atrial fibrillation: Secondary | ICD-10-CM

## 2013-04-19 DIAGNOSIS — R899 Unspecified abnormal finding in specimens from other organs, systems and tissues: Secondary | ICD-10-CM

## 2013-04-19 NOTE — Progress Notes (Signed)
Subjective:    Patient ID: [name], female    DOB: 07/26/1940, 73 y.o.   MRN: [id number]  HPI Comments: 73 yo female female presents  for AFib prophylaxis with Coumadin. She denies bruising/ bleeding or changes with dosing.  She still notes cough with white production despite starting COPD RX with Nebulizer/ Spiriva by Oncologist tomorrow for recheck with COPD meds and normal f/u. Occasional swelling of LE. She notes SOB with activity but is not very active. She did try the chair exercises AD.   Current Outpatient Prescriptions on File Prior to Visit  Medication Sig Dispense Refill  . albuterol (PROVENTIL) (2.5 MG/3ML) 0.083% nebulizer solution Take 3 mLs (2.5 mg total) by nebulization every 6 (six) hours as needed for shortness of breath.  75 mL  6  . allopurinol (ZYLOPRIM) 300 MG tablet Take 300 mg by mouth daily.      . Cholecalciferol (VITAMIN D3) 2000 UNITS TABS Take 2,000 Units by mouth 2 (two) times daily.       . citalopram (CELEXA) 20 MG tablet Take 20 mg by mouth every morning.       . diltiazem (CARDIZEM CD) 180 MG 24 hr capsule Take 180 mg by mouth every morning.       Mariane Baumgarten Sodium (DSS) 100 MG CAPS Take 100 mg by mouth daily.      Marland Kitchen levothyroxine (SYNTHROID, LEVOTHROID) 50 MCG tablet Take 1 tablet (50 mcg total) by mouth daily before breakfast.  30 tablet  0  . lidocaine-prilocaine (EMLA) cream Apply topically as needed. Apply to port a cath site one hour before needle stick as needed.  30 g  2  . loperamide (IMODIUM) 2 MG capsule Take 2 mg by mouth 4 (four) times daily as needed for diarrhea or loose stools.      . magnesium oxide (MAG-OX) 400 (241.3 MG) MG tablet Take 0.5 tablets (200 mg total) by mouth daily.  30 tablet  0  . metFORMIN (GLUCOPHAGE-XR) 500 MG 24 hr tablet Take 1,000 mg by mouth 2 (two) times daily.      . ondansetron (ZOFRAN) 8 MG tablet Take 1 tablet (8 mg total) by mouth every 12 (twelve) hours as needed for nausea.  20 tablet  3  . potassium  chloride (K-DUR) 10 MEQ tablet Take 20 mEq by mouth daily. Take 2 tablets my mouth daily      . pravastatin (PRAVACHOL) 40 MG tablet Take 40 mg by mouth at bedtime.       . prochlorperazine (COMPAZINE) 10 MG tablet Take 10 mg by mouth every 6 (six) hours as needed (nausea).      . tiotropium (SPIRIVA HANDIHALER) 18 MCG inhalation capsule Place 1 capsule (18 mcg total) into inhaler and inhale daily.  30 capsule  0   No current facility-administered medications on file prior to visit.   ALLERGIES Codeine and Penicillins  Past Medical History  Diagnosis Date  . Sick sinus syndrome   . Hematoma     At the site of the pacemaker insertion.  . Hypotension   . Dyslipidemia   . Pacemaker   . History of atrial fibrillation   . Arthritis     Osteoarthritis  . Port-a-cath in place 12/08/10  . Status post chemotherapy completed 03/2011    R - CHOP q 3 weeks x 6  . S/P radiation therapy 05/28/2011 - 06/26/2011    Right Abdomen and Right Pelvis/3060 cGy in 17 Fractions  . Diffuse large B  cell lymphoma   . Cancer 10/2010    large B cell lymphoma  . 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 Hypertension   . ASHD (arteriosclerotic heart disease)   . Gout   . COPD (chronic obstructive pulmonary disease)   . Asthma   . Hyperlipidemia   . Diabetes mellitus   . Hypothyroidism      Review of Systems  Respiratory: Positive for cough. Negative for chest tightness.   Cardiovascular: Negative for leg swelling.  All other systems reviewed and are negative.   BP 124/62  Pulse 82  Temp(Src) 98.2 F (36.8 C) (Temporal)  Resp 20  Ht 4\' 8"  (1.422 m)  Wt 218 lb (98.884 kg)  BMI 48.90 kg/m2     Objective:   Physical Exam  Nursing note and vitals reviewed. Constitutional: She is oriented to person, place, and time. She appears well-developed and well-nourished.  HENT:  Head: Normocephalic and atraumatic.  Right Ear: External ear normal.  Left Ear:  External ear normal.  Nose: Nose normal.  Mouth/Throat: Oropharynx is clear and moist. No oropharyngeal exudate.  Eyes: Conjunctivae and EOM are normal.  Neck: Normal range of motion.  Cardiovascular: Normal rate, regular rhythm, normal heart sounds and intact distal pulses.   Pulmonary/Chest: Effort normal and breath sounds normal.  Congestion clears with cough  Musculoskeletal: Normal range of motion.  Lymphadenopathy:    She has no cervical adenopathy.  Neurological: She is alert and oriented to person, place, and time.  Skin: Skin is warm and dry.  Psychiatric: She has a normal mood and affect. Judgment normal.          Assessment & Plan:  1. Afib prophylaxis- Check labs, Call office with any unusual bleeding/ bruising/ weakness 2. SOB/ Cough/ COPD- Chair exercises TID 10 min each. Mucinex 400 mg QD SX x 15, Continue RX AD keep f/u with Oncology with new start of COPD RX Spiriva

## 2013-04-19 NOTE — Patient Instructions (Signed)
Cough, Adult  A cough is a reflex. It helps you clear your throat and airways. A cough can help heal your body. A cough can last 2 or 3 weeks (acute) or may last more than 8 weeks (chronic). Some common causes of a cough can include an infection, allergy, or a cold. HOME CARE  Only take medicine as told by your doctor.  If given, take your medicines (antibiotics) as told. Finish them even if you start to feel better.  Use a cold steam vaporizer or humidier in your home. This can help loosen thick spit (secretions).  Sleep so you are almost sitting up (semi-upright). Use pillows to do this. This helps reduce coughing.  Rest as needed.  Stop smoking if you smoke. GET HELP RIGHT AWAY IF:  You have yellowish-white fluid (pus) in your thick spit.  Your cough gets worse.  Your medicine does not reduce coughing, and you are losing sleep.  You cough up blood.  You have trouble breathing.  Your pain gets worse and medicine does not help.  You have a fever. MAKE SURE YOU:   Understand these instructions.  Will watch your condition.  Will get help right away if you are not doing well or get worse. Document Released: 12/11/2010 Document Revised: 06/22/2011 Document Reviewed: 12/11/2010 [employer] Patient Information 2014 [employer].  

## 2013-04-20 ENCOUNTER — Ambulatory Visit (HOSPITAL_BASED_OUTPATIENT_CLINIC_OR_DEPARTMENT_OTHER): Payer: Medicare Other | Admitting: Internal Medicine

## 2013-04-20 ENCOUNTER — Other Ambulatory Visit (HOSPITAL_BASED_OUTPATIENT_CLINIC_OR_DEPARTMENT_OTHER): Payer: Medicare Other

## 2013-04-20 ENCOUNTER — Telehealth: Payer: Self-pay | Admitting: Internal Medicine

## 2013-04-20 ENCOUNTER — Telehealth: Payer: Self-pay | Admitting: *Deleted

## 2013-04-20 VITALS — BP 134/81 | HR 123 | Temp 98.0°F | Resp 18 | Ht <= 58 in | Wt 218.8 lb

## 2013-04-20 DIAGNOSIS — D6481 Anemia due to antineoplastic chemotherapy: Secondary | ICD-10-CM

## 2013-04-20 DIAGNOSIS — I10 Essential (primary) hypertension: Secondary | ICD-10-CM

## 2013-04-20 DIAGNOSIS — T451X5A Adverse effect of antineoplastic and immunosuppressive drugs, initial encounter: Secondary | ICD-10-CM

## 2013-04-20 DIAGNOSIS — J42 Unspecified chronic bronchitis: Secondary | ICD-10-CM

## 2013-04-20 DIAGNOSIS — D61810 Antineoplastic chemotherapy induced pancytopenia: Secondary | ICD-10-CM

## 2013-04-20 DIAGNOSIS — C8330 Diffuse large B-cell lymphoma, unspecified site: Secondary | ICD-10-CM

## 2013-04-20 DIAGNOSIS — C8583 Other specified types of non-Hodgkin lymphoma, intra-abdominal lymph nodes: Secondary | ICD-10-CM

## 2013-04-20 DIAGNOSIS — I4891 Unspecified atrial fibrillation: Secondary | ICD-10-CM

## 2013-04-20 DIAGNOSIS — E039 Hypothyroidism, unspecified: Secondary | ICD-10-CM

## 2013-04-20 DIAGNOSIS — Z7901 Long term (current) use of anticoagulants: Secondary | ICD-10-CM

## 2013-04-20 DIAGNOSIS — C8593 Non-Hodgkin lymphoma, unspecified, intra-abdominal lymph nodes: Secondary | ICD-10-CM

## 2013-04-20 LAB — BASIC METABOLIC PANEL WITH GFR
BUN: 22 mg/dL (ref 6–23)
CALCIUM: 10.4 mg/dL (ref 8.4–10.5)
CO2: 32 meq/L (ref 19–32)
Chloride: 101 mEq/L (ref 96–112)
Creat: 1.1 mg/dL (ref 0.50–1.10)
GFR, EST AFRICAN AMERICAN: 58 mL/min — AB
GFR, Est Non African American: 50 mL/min — ABNORMAL LOW
GLUCOSE: 109 mg/dL — AB (ref 70–99)
Potassium: 4.8 mEq/L (ref 3.5–5.3)
Sodium: 139 mEq/L (ref 135–145)

## 2013-04-20 LAB — COMPREHENSIVE METABOLIC PANEL (CC13)
ALT: 10 U/L (ref 0–55)
ANION GAP: 13 meq/L — AB (ref 3–11)
AST: 13 U/L (ref 5–34)
Albumin: 4 g/dL (ref 3.5–5.0)
Alkaline Phosphatase: 61 U/L (ref 40–150)
BUN: 21.9 mg/dL (ref 7.0–26.0)
CO2: 26 meq/L (ref 22–29)
CREATININE: 1.3 mg/dL — AB (ref 0.6–1.1)
Calcium: 10.7 mg/dL — ABNORMAL HIGH (ref 8.4–10.4)
Chloride: 103 mEq/L (ref 98–109)
Glucose: 141 mg/dl — ABNORMAL HIGH (ref 70–140)
Potassium: 4.5 mEq/L (ref 3.5–5.1)
Sodium: 142 mEq/L (ref 136–145)
Total Bilirubin: 0.35 mg/dL (ref 0.20–1.20)
Total Protein: 7.1 g/dL (ref 6.4–8.3)

## 2013-04-20 LAB — CBC WITH DIFFERENTIAL/PLATELET
BASO%: 0.4 % (ref 0.0–2.0)
BASOS PCT: 1 % (ref 0–1)
Basophils Absolute: 0.1 10*3/uL (ref 0.0–0.1)
Basophils Absolute: 0 10*3/uL (ref 0.0–0.1)
EOS ABS: 0.3 10*3/uL (ref 0.0–0.7)
EOS PCT: 4 % (ref 0–5)
EOS%: 3.7 % (ref 0.0–7.0)
Eosinophils Absolute: 0.3 10*3/uL (ref 0.0–0.5)
HCT: 33.2 % — ABNORMAL LOW (ref 36.0–46.0)
HEMATOCRIT: 37.1 % (ref 34.8–46.6)
HGB: 12 g/dL (ref 11.6–15.9)
Hemoglobin: 10.6 g/dL — ABNORMAL LOW (ref 12.0–15.0)
LYMPH%: 17.9 % (ref 14.0–49.7)
LYMPHS ABS: 1.2 10*3/uL (ref 0.7–4.0)
Lymphocytes Relative: 16 % (ref 12–46)
MCH: 31.5 pg (ref 26.0–34.0)
MCH: 32.2 pg (ref 25.1–34.0)
MCHC: 31.9 g/dL (ref 30.0–36.0)
MCHC: 32.3 g/dL (ref 31.5–36.0)
MCV: 98.5 fL (ref 78.0–100.0)
MCV: 99.7 fL (ref 79.5–101.0)
MONO#: 1 10*3/uL — AB (ref 0.1–0.9)
MONO%: 11.3 % (ref 0.0–14.0)
Monocytes Absolute: 0.9 10*3/uL (ref 0.1–1.0)
Monocytes Relative: 12 % (ref 3–12)
NEUT#: 5.8 10*3/uL (ref 1.5–6.5)
NEUT%: 66.7 % (ref 38.4–76.8)
Neutro Abs: 5 10*3/uL (ref 1.7–7.7)
Neutrophils Relative %: 67 % (ref 43–77)
PLATELETS: 277 10*3/uL (ref 150–400)
PLATELETS: 326 10*3/uL (ref 145–400)
RBC: 3.37 MIL/uL — ABNORMAL LOW (ref 3.87–5.11)
RBC: 3.72 10*6/uL (ref 3.70–5.45)
RDW: 19.8 % — AB (ref 11.5–15.5)
RDW: 21.3 % — ABNORMAL HIGH (ref 11.2–14.5)
WBC: 7.4 10*3/uL (ref 4.0–10.5)
WBC: 8.6 10*3/uL (ref 3.9–10.3)
lymph#: 1.5 10*3/uL (ref 0.9–3.3)

## 2013-04-20 LAB — PROTIME-INR
INR: 1.59 — ABNORMAL HIGH (ref <1.50)
INR: 1.7 — ABNORMAL LOW (ref 2.00–3.50)
PROTIME: 20.4 s — AB (ref 10.6–13.4)
Prothrombin Time: 18.7 seconds — ABNORMAL HIGH (ref 11.6–15.2)

## 2013-04-20 LAB — LACTATE DEHYDROGENASE (CC13): LDH: 188 U/L (ref 125–245)

## 2013-04-20 LAB — BRAIN NATRIURETIC PEPTIDE: Brain Natriuretic Peptide: 55.5 pg/mL (ref 0.0–100.0)

## 2013-04-20 NOTE — Progress Notes (Signed)
[place], [place] [address] [place] [address]  DIAGNOSIS: Relapsed Non-Hodgkin's lymphoma of abdomen  Antineoplastic chemotherapy induced pancytopenia  Chronic bronchitis  Diffuse large B cell lymphoma - Plan: CBC with Differential, Comprehensive metabolic panel (Cmet) - [facility], Protime-INR  Chief Complaint  Patient presents with  . 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   PAST THERAPY: q3wk R-CHOP x 6 cycles; finished in Dec 2012; followed by consolidative radiation therapy. She developed recurrent disease; started on bendamustine/Rituxan on 06/29/2012. She completed 3 cycles on 08/25/12. Stopped due to disease progression.   CURRENT THERAPY: Started salvage chemotherapy with R-GPD on 10/26/12 on q 3week cycles. Her last dose was on 03/14/2013 following 5 cycles. She received neupogen on day# 9.    INTERVAL HISTORY:  [name] 73 y.o. female returns for follow up visit. She was last seen by me on 03/30/2013. She notes some improvement in her cough since since starting spiriva.  She had the CT of Chest and abdomen and pelvis on 12/15 consistent with mild progression.     As noted previously, she had a 11-day hospitalization at [facility] (02/11/13 - 02/22/2013) secondary to febrile neutropenia.  Her infectious work-up was negative and her thrombocytopenia was prolonged.  She received antibiotics and plt transfusion.   She denies headaches or visual changes.   She is a former longstanding smoker who quit over the past year.  She denied headaches, double vision, blurry vision, nasal discharge, hearing problems, odynophagia or dysphagia. No chest pain, palpitations, dyspnea, cough, abdominal pain, vomiting, diarrhea, constipation, hematochezia. The patient denied dysuria, nocturia, polyuria, hematuria, myalgia, numbness, tingling, psychiatric  problems. She denies any further bleeding episodes including epitaxis.  She denies any fevers or chills.  She reports a good appetite.  MEDICAL HISTORY: Past Medical History  Diagnosis Date  . Sick sinus syndrome   . Hematoma     At the site of the pacemaker insertion.  . Hypotension   . Dyslipidemia   . Pacemaker   . History of atrial fibrillation   . Arthritis     Osteoarthritis  . Port-a-cath in place 12/08/10  . Status post chemotherapy completed 03/2011    R - CHOP q 3 weeks x 6  . S/P radiation therapy 05/28/2011 - 06/26/2011    Right Abdomen and Right Pelvis/3060 cGy in 17 Fractions  . Diffuse large B cell lymphoma   . Cancer 10/2010    large B cell lymphoma  . 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 Hypertension   . ASHD (arteriosclerotic heart disease)   . Gout   . COPD (chronic obstructive pulmonary disease)   . Asthma   . Hyperlipidemia   . Diabetes mellitus   . Hypothyroidism     INTERIM HISTORY: has Essential hypertension, benign; Atrial fibrillation; Cardiac pacemaker in situ; Diffuse large B cell lymphoma; Hypothyroidism; Diabetes mellitus type 2 in obese; Hyperlipidemia; Non-Hodgkin's lymphoma of abdomen; Depression; Fever and neutropenia; Antineoplastic chemotherapy induced pancytopenia; Acute renal failure; Anemia of chronic disease; Chronic bronchitis; Hypertension; ASHD (arteriosclerotic heart disease); Gout; COPD (chronic obstructive pulmonary disease); Asthma; and Diabetes mellitus on her problem list.    ALLERGIES:  is allergic to codeine and penicillins.  MEDICATIONS: has a current medication list which includes the following prescription(s): albuterol, allopurinol, vitamin d3, citalopram, diltiazem, dss, levothyroxine, lidocaine-prilocaine, loperamide, magnesium oxide, metformin, ondansetron, potassium chloride, warfarin, pravastatin,  prochlorperazine, and tiotropium, and the following Facility-Administered Medications:  sodium chloride.  SURGICAL HISTORY:  Past Surgical History  Procedure Laterality Date  . Pacemaker insertion      Lead revision completed August 04, 2006  . Cholecystectomy    . Tubal ligation      Bilateral  . Insert / replace / remove pacemaker    . Abdominal hysterectomy    . Biopsy stomach  11/03/10    Soft Tissue Mass, Biopsy, Right Lower Quadrant Mesenteric Mass- High Grade Non-Hodgkins B Cell Lymphoma  with Flow Cytometry  . Bone biopsy  11/24/10    Bone Marrow, Aspirate Biospy, and clot, Left - No Involvement of Non-Hodgkin's Lymphoma Identified    REVIEW OF SYSTEMS:   Constitutional: Denies fevers, chills or abnormal weight loss Eyes: Denies blurriness of vision Ears, nose, mouth, throat, and face: Denies mucositis or sore throat Respiratory: Positive for longstanding cough, but denies dyspnea or wheezes Cardiovascular: Denies palpitation, chest discomfort or lower extremity swelling Gastrointestinal:  Denies nausea, heartburn or change in bowel habits Skin: Denies abnormal skin rashes Lymphatics: Denies new lymphadenopathy or easy bruising Neurological:Denies numbness, tingling or new weaknesses Behavioral/Psych: Mood is stable, no new changes  All other systems were reviewed with the patient and are negative.  PHYSICAL EXAMINATION: ECOG PERFORMANCE STATUS: 2 - Symptomatic, <50% confined to bed  Blood pressure 134/81, pulse 123, temperature 98 F (36.7 C), temperature source Oral, resp. rate 18, height 4\' 8"  (1.422 m), weight 218 lb 12.8 oz (99.247 kg), SpO2 98.00%.  GENERAL:alert, no distress and comfortable;obese, sitting in wheelchair SKIN: skin color, texture, turgor are normal, no rashes or significant lesions EYES: normal, Conjunctiva are pink and non-injected, sclera clear OROPHARYNX:no exudate, no erythema and lips, buccal mucosa, and tongue normal  NECK: supple, thyroid normal size, non-tender, without nodularity LYMPH:  no palpable lymphadenopathy in the  cervical, axillary or supraclavicular LUNGS: CTAB/L without rhonchi or wheezes.  HEART: slightly tachycardia and no murmurs and no lower extremity edema ABDOMEN: obese, abdomen soft, non-tender and normal bowel sounds Musculoskeletal:no cyanosis of digits and no clubbing  NEURO: alert & oriented x 3 with fluent speech, no focal motor/sensory deficits  LABORATORY DATA: Results for orders placed in visit on 04/20/13 (from the past 48 hour(s))  CBC WITH DIFFERENTIAL     Status: Abnormal   Collection Time    04/20/13  8:25 AM      Result Value Range   WBC 8.6  3.9 - 10.3 10e3/uL   NEUT# 5.8  1.5 - 6.5 10e3/uL   HGB 12.0  11.6 - 15.9 g/dL   HCT 37.1  34.8 - 46.6 %   Platelets 326  145 - 400 10e3/uL   MCV 99.7  79.5 - 101.0 fL   MCH 32.2  25.1 - 34.0 pg   MCHC 32.3  31.5 - 36.0 g/dL   RBC 3.72  3.70 - 5.45 10e6/uL   RDW 21.3 (*) 11.2 - 14.5 %   lymph# 1.5  0.9 - 3.3 10e3/uL   MONO# 1.0 (*) 0.1 - 0.9 10e3/uL   Eosinophils Absolute 0.3  0.0 - 0.5 10e3/uL   Basophils Absolute 0.0  0.0 - 0.1 10e3/uL   NEUT% 66.7  38.4 - 76.8 %   LYMPH% 17.9  14.0 - 49.7 %   MONO% 11.3  0.0 - 14.0 %   EOS% 3.7  0.0 - 7.0 %   BASO% 0.4  0.0 - 2.0 %  PROTIME-INR     Status: Abnormal   Collection  Time    04/20/13  8:25 AM      Result Value Range   Protime 20.4 (*) 10.6 - 13.4 Seconds   INR 1.70 (*) 2.00 - 3.50   Comment: INR is useful only to assess adequacy of anticoagulation with coumadin when comparing results from different labs. It should not be used to estimate bleeding risk or presence/abscense of coagulopathy in patients not on coumadin. Expected INR ranges for      nontherapeutic patients is 0.88 - 1.12.   Lovenox No    COMPREHENSIVE METABOLIC PANEL (0000000)     Status: Abnormal   Collection Time    04/20/13  8:25 AM      Result Value Range   Sodium 142  136 - 145 mEq/L   Potassium 4.5  3.5 - 5.1 mEq/L   Chloride 103  98 - 109 mEq/L   CO2 26  22 - 29 mEq/L   Glucose 141 (*) 70 - 140 mg/dl    BUN 21.9  7.0 - 26.0 mg/dL   Creatinine 1.3 (*) 0.6 - 1.1 mg/dL   Total Bilirubin 0.35  0.20 - 1.20 mg/dL   Alkaline Phosphatase 61  40 - 150 U/L   AST 13  5 - 34 U/L   ALT 10  0 - 55 U/L   Total Protein 7.1  6.4 - 8.3 g/dL   Albumin 4.0  3.5 - 5.0 g/dL   Calcium 10.7 (*) 8.4 - 10.4 mg/dL   Anion Gap 13 (*) 3 - 11 mEq/L  LACTATE DEHYDROGENASE (CC13)     Status: None   Collection Time    04/20/13  8:25 AM      Result Value Range   LDH 188  125 - 245 U/L    Labs:  Lab Results  Component Value Date   WBC 8.6 04/20/2013   HGB 12.0 04/20/2013   HCT 37.1 04/20/2013   MCV 99.7 04/20/2013   PLT 326 04/20/2013   NEUTROABS 5.8 04/20/2013      Chemistry      Component Value Date/Time   NA 142 04/20/2013 0825   NA 139 04/19/2013 1420   NA 141 10/12/2011 0957   K 4.5 04/20/2013 0825   K 4.8 04/19/2013 1420   K 4.4 10/12/2011 0957   CL 101 04/19/2013 1420   CL 102 09/21/2012 0917   CL 95* 10/12/2011 0957   CO2 26 04/20/2013 0825   CO2 32 04/19/2013 1420   CO2 29 10/12/2011 0957   BUN 21.9 04/20/2013 0825   BUN 22 04/19/2013 1420   BUN 15 10/12/2011 0957   CREATININE 1.3* 04/20/2013 0825   CREATININE 1.10 04/19/2013 1420   CREATININE 1.15* 02/22/2013 0435      Component Value Date/Time   CALCIUM 10.7* 04/20/2013 0825   CALCIUM 10.4 04/19/2013 1420   CALCIUM 9.1 10/12/2011 0957   ALKPHOS 61 04/20/2013 0825   ALKPHOS 49 02/11/2013 2333   ALKPHOS 65 10/12/2011 0957   AST 13 04/20/2013 0825   AST 11 02/11/2013 2333   AST 19 10/12/2011 0957   ALT 10 04/20/2013 0825   ALT 19 02/11/2013 2333   ALT 20 10/12/2011 0957   BILITOT 0.35 04/20/2013 0825   BILITOT 0.5 02/11/2013 2333   BILITOT 0.60 10/12/2011 0957     Basic Metabolic Panel:  Recent Labs Lab 04/19/13 1420 04/20/13 0825  NA 139 142  K 4.8 4.5  CL 101  --   CO2 32 26  GLUCOSE 109* 141*  BUN  22 21.9  CREATININE 1.10 1.3*  CALCIUM 10.4 10.7*   GFR Estimated Creatinine Clearance: 38 ml/min (by C-G formula based on Cr of 1.3). Liver Function Tests:  Recent Labs Lab  04/20/13 0825  AST 13  ALT 10  ALKPHOS 61  BILITOT 0.35  PROT 7.1  ALBUMIN 4.0   Coagulation profile  Recent Labs Lab 04/19/13 1420 04/20/13 0825  INR 1.59* 1.70*  PROTIME  --  20.4*    CBC:  Recent Labs Lab 04/19/13 1420 04/20/13 0825  WBC 7.4 8.6  NEUTROABS 5.0 5.8  HGB 10.6* 12.0  HCT 33.2* 37.1  MCV 98.5 99.7  PLT 277 326    RADIOGRAPHIC STUDIES: (Personally reviewed by me) (03/27/2014)CT CHEST, ABDOMEN, AND PELVIS WITH CONTRAST TECHNIQUE: Multidetector CT imaging of the chest, abdomen and pelvis was performed following the standard protocol during bolus  administration of intravenous contrast. CONTRAST: 170mL OMNIPAQUE IOHEXOL 300 MG/ML SOLN, 14mL OMNIPAQUE IOHEXOL 300 MG/ML SOLN COMPARISON: CT of the chest, abdomen and pelvis 12/23/2012. FINDINGS: CT CHEST FINDINGS Mediastinum: Heart size is mildly enlarged. Left-sided pacemaker device in place with lead tips terminating in the right atrial appendage and right ventricular apex. There is no significant pericardial fluid, thickening or pericardial calcification. No pathologically enlarged mediastinal or hilar lymph nodes. Multiple prominent but nonenlarged anterior mediastinal lymph nodes appear similar to prior examinations and are highly nonspecific. Esophagus is unremarkable in appearance. Right subclavian single-lumen porta cath with tip terminating in the right atrium. Lungs/Pleura: No acute consolidative airspace disease. No pleural effusions. No suspicious appearing pulmonary nodules or masses are identified at this time. Musculoskeletal: There are no aggressive appearing lytic or blastic lesions noted in the visualized portions of the skeleton. No axillary lymphadenopathy. CT ABDOMEN AND PELVIS FINDINGS Abdomen/Pelvis: Status postcholecystectomy. The appearance of the liver, pancreas, spleen, bilateral adrenal glands and bilateral kidneys is unremarkable. Previously noted retroperitoneal lymphadenopathy appear slightly  increased compared to the prior study. This is best demonstrated on image 66 of series 2 where there is a nodal mass intimately associated with both the infrarenal abdominal aorta and the inferior vena cava measuring approximately 4.5 x 2.0 cm. More inferiorly, on the left side of the infrarenal abdominal aorta there is also an enlarging lymph node measuring 1.8 x 1.5 cm (image 73 of series 2). A significant amount of amorphous soft tissue (presumably nodal tissue) surrounds much of the infrarenal abdominal aorta and intrahepatic inferior vena cava, similar to prior studies. An enlarged ileocolic lymph node is  similar to the prior examination, currently measuring 2.1 x 1.8 cm (image 73 of series 2). There are few scattered colonic diverticulae, without surrounding inflammatory changes to suggest an acute diverticulitis at this time. No significant volume of ascites. No pneumoperitoneum. No pathologic distention of small bowel. Status post hysterectomy. Ovaries are not confidently identified may be surgically absent or atrophic. Urinary bladder is nearly completely decompressed, but otherwise unremarkable in appearance. Musculoskeletal: There are no aggressive appearing lytic or blastic lesions noted in the visualized portions of the skeleton. IMPRESSION: 1. Retroperitoneal disease appears slightly progressed compared to the prior study, with increasing periaortic/pericaval lymphadenopathy, as discussed above. 2. Enlarged ileocolic lymph node is essentially unchanged compared to the prior study. 3. No new lymphadenopathy is otherwise identified on today's study. 4. Colonic diverticulosis without findings to suggest acute diverticulitis at this time. 5. Additional incidental findings, as above.  ASSESSMENT: [name] 73 y.o. female with a history of Non-Hodgkin's lymphoma of abdomen  Antineoplastic chemotherapy induced pancytopenia  Chronic  bronchitis  Diffuse large B cell lymphoma - Plan: CBC with  Differential, Comprehensive metabolic panel (Cmet) - [facility], Protime-INR  PLAN:  1. Relapsed high grade non-hodgkin's B cell Lymphoma on salvage therapy, progressed on salvage R-GDP  -Chemotherapy reference: Relapsed high grade non-Hodgkin's B cell lymphoma on salvage therapy with DGP Doylene Canning M et. Al, Cancer 2004) + Rituximab.  She completed 5 cycles of R+DGP q 21 days (modified doses based on dehydration, electrolyte abnormalities) and we reviewed the CT Scans of C/A/P (12/15) which was consistent with a further mild  increase in retroperitoneal lymphadenopathy. She completed R+DGP for cycles # 5 on 03/14/2013 followed by neulasta (12/03) based on her prior high risk of infection and prior episode of febrile neutropenia. Given her significant co-morbidities, I favor against bone marrow transplant.  We will consider next salvagetherapy R-CEPP (dose reduced) based on her prior treatments with  R-CHOP, Bendumustin/Rituximab and R-DGP.    It will be as follows based Gevena Barre, et al, Blood 1990;[id number] with dose modification as indicated.   Rituximab 375 mg/m2 Cyclophosphamide 300 mg/m2 (50% of dose) on days 1 and 8; Etoposide 35 mg/m2 (50% of dose) on days 1,2, and 3.  Prednisone  30 mg/m2 (50% of dose) on days 1 through 10; Procarbazine 30 mg/m2 (50% of dose) orally on days 1 through 10;   2.  Anemia/Thrombocytopenia secondary chemotherapy -- Her counts have recovered.  She is asymptomatic for anemia.   3. Elevated creatinine.  --Creatinine is 1.3 today (Range 1.1 to 1.6 over the past one year). We encouraged aggressive hydration and avoidance of nephrotoxins.   We will continue to closely monitor her labs weekly.   4. Hypothyroidism: Levothyroxine per PCP  5.  Hypertension: Diltiazem per PCP.   6. Diabetes mellitus, type II: Metformin per PCP.   7. Hyperlipidemia: Pravastatin per PCP.   8. History of atrial fibrillation: Diltiazem, pacemaker. Coumadin being managed per her PCP.  9.  COPD/ Chronic Bronchitis exacerbation. --Spiriva prescribed by me (with discussion of indications and common side-effects including but not limited to chest pain, dyspepsia, abdominal pain, headache, vomiting, edema) with close follow up recommended with her PCP and possible referral to pulmonary prn.  Patient advised to continue albuterol q 6 hours as needed.    11. Follow-up in 3 weeks to consent and evaluation for treatment with salvage R-CEPP.  We will obtain CBC, CMP.   All questions were answered. The patient knows to call the clinic with any problems, questions or concerns. We can certainly see the patient much sooner if necessary.  I spent 15 minutes counseling the patient face to face. The total time spent in the appointment was 25 minutes.    Diandre Merica, MD 04/21/2013 6:47 PM

## 2013-04-20 NOTE — Patient Instructions (Signed)
R-CEEP  Rituximab injection What is this medicine? RITUXIMAB (ri TUX i mab) is a monoclonal antibody. This medicine changes the way the body's immune system works. It is used commonly to treat non-Hodgkin's lymphoma and other conditions. In cancer cells, this drug targets a specific protein within cancer cells and stops the cancer cells from growing. It is also used to treat rhuematoid arthritis (RA). In RA, this medicine slow the inflammatory process and help reduce joint pain and swelling. This medicine is often used with other cancer or arthritis medications. This medicine may be used for other purposes; ask your health care provider or pharmacist if you have questions. COMMON BRAND NAME(S): Rituxan What should I tell my health care provider before I take this medicine? They need to know if you have any of these conditions: -blood disorders -heart disease -history of hepatitis B -infection (especially a virus infection such as chickenpox, cold sores, or herpes) -irregular heartbeat -kidney disease -lung or breathing disease, like asthma -lupus -an unusual or allergic reaction to rituximab, mouse proteins, other medicines, foods, dyes, or preservatives -pregnant or trying to get pregnant -breast-feeding How should I use this medicine? This medicine is for infusion into a vein. It is administered in a hospital or clinic by a specially trained health care professional. A special MedGuide will be given to you by the pharmacist with each prescription and refill. Be sure to read this information carefully each time. Talk to your pediatrician regarding the use of this medicine in children. This medicine is not approved for use in children. Overdosage: If you think you have taken too much of this medicine contact a poison control center or emergency room at once. NOTE: This medicine is only for you. Do not share this medicine with others. What if I miss a dose? It is important not to miss a  dose. Call your doctor or health care professional if you are unable to keep an appointment. What may interact with this medicine? -cisplatin -medicines for blood pressure -some other medicines for arthritis -vaccines This list may not describe all possible interactions. Give your health care provider a list of all the medicines, herbs, non-prescription drugs, or dietary supplements you use. Also tell them if you smoke, drink alcohol, or use illegal drugs. Some items may interact with your medicine. What should I watch for while using this medicine? Report any side effects that you notice during your treatment right away, such as changes in your breathing, fever, chills, dizziness or lightheadedness. These effects are more common with the first dose. Visit your prescriber or health care professional for checks on your progress. You will need to have regular blood work. Report any other side effects. The side effects of this medicine can continue after you finish your treatment. Continue your course of treatment even though you feel ill unless your doctor tells you to stop. Call your doctor or health care professional for advice if you get a fever, chills or sore throat, or other symptoms of a cold or flu. Do not treat yourself. This drug decreases your body's ability to fight infections. Try to avoid being around people who are sick. This medicine may increase your risk to bruise or bleed. Call your doctor or health care professional if you notice any unusual bleeding. Be careful brushing and flossing your teeth or using a toothpick because you may get an infection or bleed more easily. If you have any dental work done, tell your dentist you are receiving this medicine. Avoid  taking products that contain aspirin, acetaminophen, ibuprofen, naproxen, or ketoprofen unless instructed by your doctor. These medicines may hide a fever. Do not become pregnant while taking this medicine. Women should inform  their doctor if they wish to become pregnant or think they might be pregnant. There is a potential for serious side effects to an unborn child. Talk to your health care professional or pharmacist for more information. Do not breast-feed an infant while taking this medicine. What side effects may I notice from receiving this medicine? Side effects that you should report to your doctor or health care professional as soon as possible: -allergic reactions like skin rash, itching or hives, swelling of the face, lips, or tongue -low blood counts - this medicine may decrease the number of white blood cells, red blood cells and platelets. You may be at increased risk for infections and bleeding. -signs of infection - fever or chills, cough, sore throat, pain or difficulty passing urine -signs of decreased platelets or bleeding - bruising, pinpoint red spots on the skin, black, tarry stools, blood in the urine -signs of decreased red blood cells - unusually weak or tired, fainting spells, lightheadedness -breathing problems -confused, not responsive -chest pain -fast, irregular heartbeat -feeling faint or lightheaded, falls -mouth sores -redness, blistering, peeling or loosening of the skin, including inside the mouth -stomach pain -swelling of the ankles, feet, or hands -trouble passing urine or change in the amount of urine Side effects that usually do not require medical attention (report to your doctor or other health care professional if they continue or are bothersome): -anxiety -headache -loss of appetite -muscle aches -nausea -night sweats This list may not describe all possible side effects. Call your doctor for medical advice about side effects. You may report side effects to FDA at 1-800-FDA-1088. Where should I keep my medicine? This drug is given in a hospital or clinic and will not be stored at home. NOTE: This sheet is a summary. It may not cover all possible information. If you  have questions about this medicine, talk to your doctor, pharmacist, or health care provider.  2014, [employer] Standard. (2007-11-28 14:04:59)  Cyclophosphamide injection What is this medicine? CYCLOPHOSPHAMIDE (sye kloe FOSS fa mide) is a chemotherapy drug. It slows the growth of cancer cells. This medicine is used to treat many types of cancer like lymphoma, myeloma, leukemia, breast cancer, and ovarian cancer, to name a few. This medicine may be used for other purposes; ask your health care provider or pharmacist if you have questions. COMMON BRAND NAME(S): Cytoxan, Neosar What should I tell my health care provider before I take this medicine? They need to know if you have any of these conditions: -blood disorders -history of other chemotherapy -infection -kidney disease -liver disease -recent or ongoing radiation therapy -tumors in the bone marrow -an unusual or allergic reaction to cyclophosphamide, other chemotherapy, other medicines, foods, dyes, or preservatives -pregnant or trying to get pregnant -breast-feeding How should I use this medicine? This drug is usually given as an injection into a vein or muscle or by infusion into a vein. It is administered in a hospital or clinic by a specially trained health care professional. Talk to your pediatrician regarding the use of this medicine in children. Special care may be needed. Overdosage: If you think you have taken too much of this medicine contact a poison control center or emergency room at once. NOTE: This medicine is only for you. Do not share this medicine with others. What if  I miss a dose? It is important not to miss your dose. Call your doctor or health care professional if you are unable to keep an appointment. What may interact with this medicine? This medicine may interact with the following medications: -amiodarone -amphotericin B -azathioprine -certain antiviral medicines for HIV or AIDS such as protease  inhibitors (e.g., indinavir, ritonavir) and zidovudine -certain blood pressure medications such as benazepril, captopril, enalapril, fosinopril, lisinopril, moexipril, monopril, perindopril, quinapril, ramipril, trandolapril -certain cancer medications such as anthracyclines (e.g., daunorubicin, doxorubicin), busulfan, cytarabine, paclitaxel, pentostatin, tamoxifen, trastuzumab -certain diuretics such as chlorothiazide, chlorthalidone, hydrochlorothiazide, indapamide, metolazone -certain medicines that treat or prevent blood clots like warfarin -certain muscle relaxants such as succinylcholine -cyclosporine -etanercept -indomethacin -medicines to increase blood counts like filgrastim, pegfilgrastim, sargramostim -medicines used as general anesthesia -metronidazole -natalizumab This list may not describe all possible interactions. Give your health care provider a list of all the medicines, herbs, non-prescription drugs, or dietary supplements you use. Also tell them if you smoke, drink alcohol, or use illegal drugs. Some items may interact with your medicine. What should I watch for while using this medicine? Visit your doctor for checks on your progress. This drug may make you feel generally unwell. This is not uncommon, as chemotherapy can affect healthy cells as well as cancer cells. Report any side effects. Continue your course of treatment even though you feel ill unless your doctor tells you to stop. Drink water or other fluids as directed. Urinate often, even at night. In some cases, you may be given additional medicines to help with side effects. Follow all directions for their use. Call your doctor or health care professional for advice if you get a fever, chills or sore throat, or other symptoms of a cold or flu. Do not treat yourself. This drug decreases your body's ability to fight infections. Try to avoid being around people who are sick. This medicine may increase your risk to bruise  or bleed. Call your doctor or health care professional if you notice any unusual bleeding. Be careful brushing and flossing your teeth or using a toothpick because you may get an infection or bleed more easily. If you have any dental work done, tell your dentist you are receiving this medicine. You may get drowsy or dizzy. Do not drive, use machinery, or do anything that needs mental alertness until you know how this medicine affects you. Do not become pregnant while taking this medicine or for 1 year after stopping it. Women should inform their doctor if they wish to become pregnant or think they might be pregnant. Men should not father a child while taking this medicine and for 4 months after stopping it. There is a potential for serious side effects to an unborn child. Talk to your health care professional or pharmacist for more information. Do not breast-feed an infant while taking this medicine. This medicine may interfere with the ability to have a child. This medicine has caused ovarian failure in some women. This medicine has caused reduced sperm counts in some men. You should talk with your doctor or health care professional if you are concerned about your fertility. If you are going to have surgery, tell your doctor or health care professional that you have taken this medicine. What side effects may I notice from receiving this medicine? Side effects that you should report to your doctor or health care professional as soon as possible: -allergic reactions like skin rash, itching or hives, swelling of the face, lips, or  tongue -low blood counts - this medicine may decrease the number of white blood cells, red blood cells and platelets. You may be at increased risk for infections and bleeding. -signs of infection - fever or chills, cough, sore throat, pain or difficulty passing urine -signs of decreased platelets or bleeding - bruising, pinpoint red spots on the skin, black, tarry stools, blood in  the urine -signs of decreased red blood cells - unusually weak or tired, fainting spells, lightheadedness -breathing problems -dark urine -dizziness -palpitations -swelling of the ankles, feet, hands -trouble passing urine or change in the amount of urine -weight gain -yellowing of the eyes or skin Side effects that usually do not require medical attention (report to your doctor or health care professional if they continue or are bothersome): -changes in nail or skin color -hair loss -missed menstrual periods -mouth sores -nausea, vomiting This list may not describe all possible side effects. Call your doctor for medical advice about side effects. You may report side effects to FDA at 1-800-FDA-1088. Where should I keep my medicine? This drug is given in a hospital or clinic and will not be stored at home. NOTE: This sheet is a summary. It may not cover all possible information. If you have questions about this medicine, talk to your doctor, pharmacist, or health care provider.  2014, [employer] Standard. (2012-02-12 16:22:58)  Etoposide, VP-16 injection What is this medicine? ETOPOSIDE, VP-16 (e toe POE side) is a chemotherapy drug. It is used to treat testicular cancer, lung cancer, and other cancers. This medicine may be used for other purposes; ask your health care provider or pharmacist if you have questions. COMMON BRAND NAME(S): Etopophos, Toposar, VePesid What should I tell my health care provider before I take this medicine? They need to know if you have any of these conditions: -infection -kidney disease -low blood counts, like low white cell, platelet, or red cell counts -an unusual or allergic reaction to etoposide, other chemotherapeutic agents, other medicines, foods, dyes, or preservatives -pregnant or trying to get pregnant -breast-feeding How should I use this medicine? This medicine is for infusion into a vein. It is administered in a hospital or clinic by a  specially trained health care professional. Talk to your pediatrician regarding the use of this medicine in children. Special care may be needed. Overdosage: If you think you have taken too much of this medicine contact a poison control center or emergency room at once. NOTE: This medicine is only for you. Do not share this medicine with others. What if I miss a dose? It is important not to miss your dose. Call your doctor or health care professional if you are unable to keep an appointment. What may interact with this medicine? -cyclosporine -medicines to increase blood counts like filgrastim, pegfilgrastim, sargramostim -vaccines This list may not describe all possible interactions. Give your health care provider a list of all the medicines, herbs, non-prescription drugs, or dietary supplements you use. Also tell them if you smoke, drink alcohol, or use illegal drugs. Some items may interact with your medicine. What should I watch for while using this medicine? Visit your doctor for checks on your progress. This drug may make you feel generally unwell. This is not uncommon, as chemotherapy can affect healthy cells as well as cancer cells. Report any side effects. Continue your course of treatment even though you feel ill unless your doctor tells you to stop. In some cases, you may be given additional medicines to help with side effects.  Follow all directions for their use. Call your doctor or health care professional for advice if you get a fever, chills or sore throat, or other symptoms of a cold or flu. Do not treat yourself. This drug decreases your body's ability to fight infections. Try to avoid being around people who are sick. This medicine may increase your risk to bruise or bleed. Call your doctor or health care professional if you notice any unusual bleeding. Be careful brushing and flossing your teeth or using a toothpick because you may get an infection or bleed more easily. If you have  any dental work done, tell your dentist you are receiving this medicine. Avoid taking products that contain aspirin, acetaminophen, ibuprofen, naproxen, or ketoprofen unless instructed by your doctor. These medicines may hide a fever. Do not become pregnant while taking this medicine. Women should inform their doctor if they wish to become pregnant or think they might be pregnant. There is a potential for serious side effects to an unborn child. Talk to your health care professional or pharmacist for more information. Do not breast-feed an infant while taking this medicine. What side effects may I notice from receiving this medicine? Side effects that you should report to your doctor or health care professional as soon as possible: -allergic reactions like skin rash, itching or hives, swelling of the face, lips, or tongue -low blood counts - this medicine may decrease the number of white blood cells, red blood cells and platelets. You may be at increased risk for infections and bleeding. -signs of infection - fever or chills, cough, sore throat, pain or difficulty passing urine -signs of decreased platelets or bleeding - bruising, pinpoint red spots on the skin, black, tarry stools, blood in the urine -signs of decreased red blood cells - unusually weak or tired, fainting spells, lightheadedness -breathing problems -changes in vision -mouth or throat sores or ulcers -pain, redness, swelling or irritation at the injection site -pain, tingling, numbness in the hands or feet -redness, blistering, peeling or loosening of the skin, including inside the mouth -seizures -vomiting Side effects that usually do not require medical attention (report to your doctor or health care professional if they continue or are bothersome): -diarrhea -hair loss -loss of appetite -nausea -stomach pain This list may not describe all possible side effects. Call your doctor for medical advice about side effects. You may  report side effects to FDA at 1-800-FDA-1088. Where should I keep my medicine? This drug is given in a hospital or clinic and will not be stored at home. NOTE: This sheet is a summary. It may not cover all possible information. If you have questions about this medicine, talk to your doctor, pharmacist, or health care provider.  2014, [employer] Standard. (2007-08-01 17:24:12)  Prednisone tablets What is this medicine? PREDNISONE (PRED ni sone) is a corticosteroid. It is commonly used to treat inflammation of the skin, joints, lungs, and other organs. Common conditions treated include asthma, allergies, and arthritis. It is also used for other conditions, such as blood disorders and diseases of the adrenal glands. This medicine may be used for other purposes; ask your health care provider or pharmacist if you have questions. COMMON BRAND NAME(S): Deltasone, Predone, Sterapred DS, Sterapred What should I tell my health care provider before I take this medicine? They need to know if you have any of these conditions: -Cushing's syndrome -diabetes -glaucoma -heart disease -high blood pressure -infection (especially a virus infection such as chickenpox, cold sores, or herpes) -kidney  disease -liver disease -mental illness -myasthenia gravis -osteoporosis -seizures -stomach or intestine problems -thyroid disease -an unusual or allergic reaction to lactose, prednisone, other medicines, foods, dyes, or preservatives -pregnant or trying to get pregnant -breast-feeding How should I use this medicine? Take this medicine by mouth with a glass of water. Follow the directions on the prescription label. Take this medicine with food. If you are taking this medicine once a day, take it in the morning. Do not take more medicine than you are told to take. Do not suddenly stop taking your medicine because you may develop a severe reaction. Your doctor will tell you how much medicine to take. If your  doctor wants you to stop the medicine, the dose may be slowly lowered over time to avoid any side effects. Talk to your pediatrician regarding the use of this medicine in children. Special care may be needed. Overdosage: If you think you have taken too much of this medicine contact a poison control center or emergency room at once. NOTE: This medicine is only for you. Do not share this medicine with others. What if I miss a dose? If you miss a dose, take it as soon as you can. If it is almost time for your next dose, talk to your doctor or health care professional. You may need to miss a dose or take an extra dose. Do not take double or extra doses without advice. What may interact with this medicine? Do not take this medicine with any of the following medications: -metyrapone -mifepristone This medicine may also interact with the following medications: -aminoglutethimide -amphotericin B -aspirin and aspirin-like medicines -barbiturates -certain medicines for diabetes, like glipizide or glyburide -cholestyramine -cholinesterase inhibitors -cyclosporine -digoxin -diuretics -ephedrine -female hormones, like estrogens and birth control pills -isoniazid -ketoconazole -NSAIDS, medicines for pain and inflammation, like ibuprofen or naproxen -phenytoin -rifampin -toxoids -vaccines -warfarin This list may not describe all possible interactions. Give your health care provider a list of all the medicines, herbs, non-prescription drugs, or dietary supplements you use. Also tell them if you smoke, drink alcohol, or use illegal drugs. Some items may interact with your medicine. What should I watch for while using this medicine? Visit your doctor or health care professional for regular checks on your progress. If you are taking this medicine over a prolonged period, carry an identification card with your name and address, the type and dose of your medicine, and your doctor's name and  address. This medicine may increase your risk of getting an infection. Tell your doctor or health care professional if you are around anyone with measles or chickenpox, or if you develop sores or blisters that do not heal properly. If you are going to have surgery, tell your doctor or health care professional that you have taken this medicine within the last twelve months. Ask your doctor or health care professional about your diet. You may need to lower the amount of salt you eat. This medicine may affect blood sugar levels. If you have diabetes, check with your doctor or health care professional before you change your diet or the dose of your diabetic medicine. What side effects may I notice from receiving this medicine? Side effects that you should report to your doctor or health care professional as soon as possible: -allergic reactions like skin rash, itching or hives, swelling of the face, lips, or tongue -changes in emotions or moods -changes in vision -depressed mood -eye pain -fever or chills, cough, sore throat, pain or difficulty  passing urine -increased thirst -swelling of ankles, feet Side effects that usually do not require medical attention (report to your doctor or health care professional if they continue or are bothersome): -confusion, excitement, restlessness -headache -nausea, vomiting -skin problems, acne, thin and shiny skin -trouble sleeping -weight gain This list may not describe all possible side effects. Call your doctor for medical advice about side effects. You may report side effects to FDA at 1-800-FDA-1088. Where should I keep my medicine? Keep out of the reach of children. Store at room temperature between 15 and 30 degrees C (59 and 86 degrees F). Protect from light. Keep container tightly closed. Throw away any unused medicine after the expiration date. NOTE: This sheet is a summary. It may not cover all possible information. If you have questions about  this medicine, talk to your doctor, pharmacist, or health care provider.  2014, [employer]. (2010-11-13 10:57:14)  Procarbazine capsules What is this medicine? PROCARBAZINE (proe KAR ba zeen) is a chemotherapy drug. This medicine reduces the growth of cancer cells. It is used to treat Hodgkin's disease. It is often given with other cancer drugs. This medicine may be used for other purposes; ask your health care provider or pharmacist if you have questions. COMMON BRAND NAME(S): Matulane What should I tell my health care provider before I take this medicine? They need to know if you have any of these conditions: -infection (especially virus infection such as chickenpox or herpes) -kidney disease -liver disease -low blood counts like low platelets, red blood cells, white blood cells -smoke tobacco -an unusual or allergic reaction to procarbazine, other chemotherapy, other medicines, foods, dyes, or preservatives -pregnant or trying to get pregnant -breast-feeding How should I use this medicine? Take this medicine by mouth with a glass of water. Follow the directions on the prescription label. Take your medicine at regular intervals. Do not take your medicine more often than directed. Do not stop taking except on your doctor's advice. Talk to your pediatrician regarding the use of this medicine in children. While this drug may be prescribed for selected conditions, precautions do apply. Overdosage: If you think you have taken too much of this medicine contact a poison control center or emergency room at once. NOTE: This medicine is only for you. Do not share this medicine with others. What if I miss a dose? If you miss a dose, take it as soon as you can. If it is almost time for your next dose, take only that dose. Do not take double or extra doses. What may interact with this medicine? Do not take this medicine with any of the following medications: -alcohol -green  tea -furazolidone -isoniazid -linezolid -MAOIs like Carbex, Eldepryl, Marplan, Nardil, and Parnate -medicines for allergies, colds, or congestion -medicines for migraine -medicines for depression, anxiety, or psychotic disturbances -some medications for Parkinsons disease like entacapone, levodopa, tolcapone -stimulant medicines for attention disorders, weight loss, or to stay awake This medicine may also interact with the following medications: -dextromethorphan -medicines for sleep during surgery -medicines to increase blood counts like filgrastim, pegfilgrastim, sargramostim -medicines to numb skin or other tissue during procedures -meperidine -pentazocine -phenothiazines like chlorpromazine, mesoridazine, prochlorperazine, thioridazine -phenytoin -tramadol -trazodone -tyramine in some foods and drinks; ask your dietician for a complete list of foods to avoid -vaccines This list may not describe all possible interactions. Give your health care provider a list of all the medicines, herbs, non-prescription drugs, or dietary supplements you use. Also tell them if you smoke, drink alcohol,  or use illegal drugs. Some items may interact with your medicine. What should I watch for while using this medicine? Visit your doctor for checks on your progress. This drug may make you feel generally unwell. This is not uncommon, as chemotherapy can affect healthy cells as well as cancer cells. Report any side effects. Continue your course of treatment even though you feel ill unless your doctor tells you to stop. This medicine can interact with certain foods that contain tyramine. The combination may cause severe headaches, a rise in blood pressure, irregular heart beat, or you may otherwise feel unwell. Foods that contain significant amounts of tyramine include aged cheeses, meats and fish (especially aged, smoked, pickled, or processed such as bologna, pepperoni, salami, summer sausage), beer and ale,  alcohol-free beer, wine (especially red), sherry, hard liquor, liqueurs, avocados, bananas, figs, raisins, soy sauce, miso soup, yeast/protein extracts, bean curd, fava or broad bean pods, or any over-ripe fruit. Ask your doctor or health care professional, pharmacist, or nutritionist for a complete listing of tyramine-containing foods. Also, avoid drinks containing caffeine, such as tea, coffee, chocolate, or cola. Call your doctor or health care professional for advice if you get a fever, chills or sore throat, or other symptoms of a cold or flu. Do not treat yourself. This drug decreases your body's ability to fight infections. Try to avoid being around people who are sick. This medicine may increase your risk to bruise or bleed. Call your doctor or health care professional if you notice any unusual bleeding. Be careful brushing and flossing your teeth or using a toothpick because you may get an infection or bleed more easily. If you have any dental work done, tell your dentist you are receiving this medicine. Avoid taking products that contain aspirin, acetaminophen, ibuprofen, naproxen, or ketoprofen unless instructed by your doctor. These medicines may hide a fever. You may get drowsy or dizzy. Do not drive, use machinery, or do anything that needs mental alertness until you know how this medicine affects you. Do not stand or sit up quickly, especially if you are an older patient. This reduces the risk of dizzy or fainting spells. Alcohol may interfere with the effect of this medicine. Avoid alcoholic drinks. It is recommended that you stop smoking tobacco products (like cigarettes or cigars) while taking this medicine. Smoking tobacco products may increase your risk of developing lung cancer in the future. Do not become pregnant while taking this medicine. Women should inform their doctor if they wish to become pregnant or think they might be pregnant. There is a potential for serious side effects to  an unborn child. Talk to your health care professional or pharmacist for more information. Do not breast-feed an infant while taking this medicine. Men should inform their doctors if they wish to father a child. This medicine may lower sperm counts. Tell your health care professional that you are taking this medicine if you are scheduled to have any surgery, procedure or medical testing. What side effects may I notice from receiving this medicine? Side effects that you should report to your doctor or health care professional as soon as possible: -allergic reactions like skin rash, itching or hives, swelling of the face, lips, or tongue -low blood counts - this medicine may decrease the number of white blood cells, red blood cells and platelets. You may be at increased risk for infections and bleeding. -signs of infection - fever or chills, cough, sore throat, pain or difficulty passing urine -signs of decreased  platelets or bleeding - bruising, pinpoint red spots on the skin, black, tarry stools, blood in the urine -signs of decreased red blood cells - unusually weak or tired, fainting spells, lightheadedness -breathing problems -changes in vision -confusion -cough -fast, irregular heartbeat -hallucination -mouth sores -muscle weakness -pain, tingling, numbness in the hands or feet -problems with balance, talking, walking -seizures -yellowing of the eyes or skin -tremor Side effects that usually do not require medical attention (report to your doctor or health care professional if they continue or are bothersome): -constipation or diarrhea -darker skin color -dizziness -dry mouth -hair loss -headache -loss of appetite -nausea, vomiting -trouble sleeping This list may not describe all possible side effects. Call your doctor for medical advice about side effects. You may report side effects to FDA at 1-800-FDA-1088. Where should I keep my medicine? Keep out of the reach of  children. Store at room temperature between 15 and 30 degrees C (59 and 86 degrees F). Protect from light. Keep container tightly closed. Throw away any unused medicine after the expiration date. NOTE: This sheet is a summary. It may not cover all possible information. If you have questions about this medicine, talk to your doctor, pharmacist, or health care provider.  2014, [employer]. (2007-10-27 14:03:59)

## 2013-04-20 NOTE — Telephone Encounter (Signed)
Message copied by Oneita Hurt on Thu Apr 20, 2013 11:47 AM ------      Message from: [place], [place] R      Created: Thu Apr 20, 2013 11:24 AM       Her Kidney function (BMP) and CBC are improving a little. Her coumadin level is ok to stay the same for now, with her occasional nose bleeds and fall risk we do not want the blood too thin. Rest normal ------

## 2013-04-20 NOTE — Telephone Encounter (Signed)
per [facility] staff msg rs 01/12 to 01/21 and called pts home phone and spoke w someone who indicated he understood the cancellation on 01/12 and rs to 01/21 shh

## 2013-04-21 ENCOUNTER — Encounter: Payer: Self-pay | Admitting: Internal Medicine

## 2013-04-24 NOTE — Telephone Encounter (Signed)
Spoke with patient about recent lab results and instructions.  Released results to MyChart for patient to view results.

## 2013-04-26 ENCOUNTER — Other Ambulatory Visit: Payer: Self-pay | Admitting: Internal Medicine

## 2013-05-10 ENCOUNTER — Other Ambulatory Visit: Payer: Self-pay | Admitting: Internal Medicine

## 2013-05-11 ENCOUNTER — Telehealth: Payer: Self-pay | Admitting: Internal Medicine

## 2013-05-11 ENCOUNTER — Ambulatory Visit (HOSPITAL_BASED_OUTPATIENT_CLINIC_OR_DEPARTMENT_OTHER): Payer: Medicare Other | Admitting: Internal Medicine

## 2013-05-11 ENCOUNTER — Other Ambulatory Visit (HOSPITAL_BASED_OUTPATIENT_CLINIC_OR_DEPARTMENT_OTHER): Payer: Medicare Other

## 2013-05-11 VITALS — BP 133/64 | HR 124 | Temp 98.2°F | Resp 20 | Ht <= 58 in | Wt 216.0 lb

## 2013-05-11 DIAGNOSIS — T451X5A Adverse effect of antineoplastic and immunosuppressive drugs, initial encounter: Secondary | ICD-10-CM

## 2013-05-11 DIAGNOSIS — D6959 Other secondary thrombocytopenia: Secondary | ICD-10-CM

## 2013-05-11 DIAGNOSIS — R944 Abnormal results of kidney function studies: Secondary | ICD-10-CM

## 2013-05-11 DIAGNOSIS — D6481 Anemia due to antineoplastic chemotherapy: Secondary | ICD-10-CM

## 2013-05-11 DIAGNOSIS — C8330 Diffuse large B-cell lymphoma, unspecified site: Secondary | ICD-10-CM

## 2013-05-11 DIAGNOSIS — I4891 Unspecified atrial fibrillation: Secondary | ICD-10-CM

## 2013-05-11 DIAGNOSIS — D61810 Antineoplastic chemotherapy induced pancytopenia: Secondary | ICD-10-CM

## 2013-05-11 DIAGNOSIS — E039 Hypothyroidism, unspecified: Secondary | ICD-10-CM

## 2013-05-11 DIAGNOSIS — C8583 Other specified types of non-Hodgkin lymphoma, intra-abdominal lymph nodes: Secondary | ICD-10-CM

## 2013-05-11 DIAGNOSIS — I10 Essential (primary) hypertension: Secondary | ICD-10-CM

## 2013-05-11 DIAGNOSIS — J441 Chronic obstructive pulmonary disease with (acute) exacerbation: Secondary | ICD-10-CM

## 2013-05-11 DIAGNOSIS — D638 Anemia in other chronic diseases classified elsewhere: Secondary | ICD-10-CM

## 2013-05-11 DIAGNOSIS — C8593 Non-Hodgkin lymphoma, unspecified, intra-abdominal lymph nodes: Secondary | ICD-10-CM

## 2013-05-11 LAB — CBC WITH DIFFERENTIAL/PLATELET
BASO%: 1.1 % (ref 0.0–2.0)
BASOS ABS: 0.1 10*3/uL (ref 0.0–0.1)
EOS%: 2 % (ref 0.0–7.0)
Eosinophils Absolute: 0.2 10*3/uL (ref 0.0–0.5)
HCT: 36.3 % (ref 34.8–46.6)
HEMOGLOBIN: 12 g/dL (ref 11.6–15.9)
LYMPH#: 1.3 10*3/uL (ref 0.9–3.3)
LYMPH%: 13.9 % — ABNORMAL LOW (ref 14.0–49.7)
MCH: 32.4 pg (ref 25.1–34.0)
MCHC: 33.2 g/dL (ref 31.5–36.0)
MCV: 97.7 fL (ref 79.5–101.0)
MONO#: 0.8 10*3/uL (ref 0.1–0.9)
MONO%: 8.7 % (ref 0.0–14.0)
NEUT#: 7.1 10*3/uL — ABNORMAL HIGH (ref 1.5–6.5)
NEUT%: 74.3 % (ref 38.4–76.8)
Platelets: 292 10*3/uL (ref 145–400)
RBC: 3.71 10*6/uL (ref 3.70–5.45)
RDW: 18.8 % — ABNORMAL HIGH (ref 11.2–14.5)
WBC: 9.5 10*3/uL (ref 3.9–10.3)

## 2013-05-11 LAB — PROTIME-INR
INR: 1.8 — AB (ref 2.00–3.50)
Protime: 21.6 Seconds — ABNORMAL HIGH (ref 10.6–13.4)

## 2013-05-11 LAB — COMPREHENSIVE METABOLIC PANEL (CC13)
ALT: 15 U/L (ref 0–55)
ANION GAP: 13 meq/L — AB (ref 3–11)
AST: 16 U/L (ref 5–34)
Albumin: 4.2 g/dL (ref 3.5–5.0)
Alkaline Phosphatase: 67 U/L (ref 40–150)
BILIRUBIN TOTAL: 0.24 mg/dL (ref 0.20–1.20)
BUN: 11 mg/dL (ref 7.0–26.0)
CHLORIDE: 107 meq/L (ref 98–109)
CO2: 23 mEq/L (ref 22–29)
CREATININE: 1.3 mg/dL — AB (ref 0.6–1.1)
Calcium: 10.6 mg/dL — ABNORMAL HIGH (ref 8.4–10.4)
Glucose: 147 mg/dl — ABNORMAL HIGH (ref 70–140)
Potassium: 4.5 mEq/L (ref 3.5–5.1)
Sodium: 143 mEq/L (ref 136–145)
Total Protein: 7.1 g/dL (ref 6.4–8.3)

## 2013-05-11 NOTE — Telephone Encounter (Signed)
Gave pt appt for lab and MD on March, date per pt rqst

## 2013-05-11 NOTE — Progress Notes (Signed)
[facility] OFFICE PROGRESS NOTE  [place], MD [address] [place] [address]  DIAGNOSIS: Relapsed Non-Hodgkin's lymphoma of abdomen - Plan: CBC with Differential, Comprehensive metabolic panel (Cmet) - [facility], Lactate dehydrogenase (LDH) - [facility]  Atrial fibrillation  Anemia of chronic disease  Antineoplastic chemotherapy induced pancytopenia  Chief Complaint  Patient presents with  . 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   PAST THERAPY: q3wk R-CHOP x 6 cycles; finished in Dec 2012; followed by consolidative radiation therapy. She developed recurrent disease; started on bendamustine/Rituxan on 06/29/2012. She completed 3 cycles on 08/25/12. Stopped due to disease progression.   CURRENT THERAPY: Started salvage chemotherapy with R-GPD on 10/26/12 on q 3week cycles. Her last dose was on 03/14/2013 following 5 cycles. She received neupogen on day# 9.    INTERVAL HISTORY:  [name] 73 y.o. female returns for follow up visit. She was last seen by me on 04/20/2013. She notes some improvement in her cough since since starting spiriva and continues to use albuterol prn.  She had the CT of Chest and abdomen and pelvis on 12/15 consistent with mild progression.  Today, she is accompanied by her nieces [name] and [name].      As noted previously, she had a 11-day hospitalization at [facility] (02/11/13 - 02/22/2013) secondary to febrile neutropenia.  Her infectious work-up was negative and her thrombocytopenia was prolonged.  She received antibiotics and plt transfusion.   She denies headaches or visual changes.   She is a former longstanding smoker who quit over the past year.  She denied headaches, double vision, blurry vision, nasal discharge, hearing problems, odynophagia or dysphagia. No chest pain, palpitations, dyspnea, cough, abdominal pain, vomiting, diarrhea, constipation,  hematochezia. The patient denied dysuria, nocturia, polyuria, hematuria, myalgia, numbness, tingling, psychiatric problems. She denies any further bleeding episodes including epitaxis.  She denies any fevers or chills.  She reports a good appetite.  MEDICAL HISTORY: Past Medical History  Diagnosis Date  . Sick sinus syndrome   . Hematoma     At the site of the pacemaker insertion.  . Hypotension   . Dyslipidemia   . Pacemaker   . History of atrial fibrillation   . Arthritis     Osteoarthritis  . Port-a-cath in place 12/08/10  . Status post chemotherapy completed 03/2011    R - CHOP q 3 weeks x 6  . S/P radiation therapy 05/28/2011 - 06/26/2011    Right Abdomen and Right Pelvis/3060 cGy in 17 Fractions  . Diffuse large B cell lymphoma   . Cancer 10/2010    large B cell lymphoma  . 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 Hypertension   . ASHD (arteriosclerotic heart disease)   . Gout   . COPD (chronic obstructive pulmonary disease)   . Asthma   . Hyperlipidemia   . Diabetes mellitus   . Hypothyroidism     INTERIM HISTORY: has Essential hypertension, benign; Atrial fibrillation; Cardiac pacemaker in situ; Diffuse large B cell lymphoma; Hypothyroidism; Diabetes mellitus type 2 in obese; Hyperlipidemia; Non-Hodgkin's lymphoma of abdomen; Depression; Fever and neutropenia; Antineoplastic chemotherapy induced pancytopenia; Acute renal failure; Anemia of chronic disease; Chronic bronchitis; Hypertension; ASHD (arteriosclerotic heart disease); Gout; COPD (chronic obstructive pulmonary disease); Asthma; and Diabetes mellitus on her problem list.    ALLERGIES:  is allergic to codeine and penicillins.  MEDICATIONS: has a current medication list which includes the  following prescription(s): albuterol, allopurinol, vitamin d3, citalopram, diltiazem, dss, levothyroxine, lidocaine-prilocaine, loperamide, magnesium oxide, metformin, ondansetron, potassium  chloride, pravastatin, prochlorperazine, spiriva handihaler, warfarin, and potassium chloride.  SURGICAL HISTORY:  Past Surgical History  Procedure Laterality Date  . Pacemaker insertion      Lead revision completed August 04, 2006  . Cholecystectomy    . Tubal ligation      Bilateral  . Insert / replace / remove pacemaker    . Abdominal hysterectomy    . Biopsy stomach  11/03/10    Soft Tissue Mass, Biopsy, Right Lower Quadrant Mesenteric Mass- High Grade Non-Hodgkins B Cell Lymphoma  with Flow Cytometry  . Bone biopsy  11/24/10    Bone Marrow, Aspirate Biospy, and clot, Left - No Involvement of Non-Hodgkin's Lymphoma Identified    REVIEW OF SYSTEMS:   Constitutional: Denies fevers, chills or abnormal weight loss Eyes: Denies blurriness of vision Ears, nose, mouth, throat, and face: Denies mucositis or sore throat Respiratory: Positive for longstanding cough, but denies dyspnea or wheezes Cardiovascular: Denies palpitation, chest discomfort or lower extremity swelling Gastrointestinal:  Denies nausea, heartburn or change in bowel habits Skin: Denies abnormal skin rashes Lymphatics: Denies new lymphadenopathy or easy bruising Neurological:Denies numbness, tingling or new weaknesses Behavioral/Psych: Mood is stable, no new changes  All other systems were reviewed with the patient and are negative.  PHYSICAL EXAMINATION: ECOG PERFORMANCE STATUS: 2 - Symptomatic, <50% confined to bed  Blood pressure 133/64, pulse 124, temperature 98.2 F (36.8 C), temperature source Oral, resp. rate 20, height 4\' 8"  (1.422 m), weight 216 lb (97.977 kg).  GENERAL:alert, no distress and comfortable;obese, sitting in wheelchair SKIN: skin color, texture, turgor are normal, no rashes or significant lesions EYES: normal, Conjunctiva are pink and non-injected, sclera clear OROPHARYNX:no exudate, no erythema and lips, buccal mucosa, and tongue normal  NECK: supple, thyroid normal size, non-tender, without  nodularity LYMPH:  no palpable lymphadenopathy in the cervical, axillary or supraclavicular LUNGS: CTAB/L without rhonchi or wheezes.  HEART: slightly tachycardia and no murmurs and no lower extremity edema ABDOMEN: obese, abdomen soft, non-tender and normal bowel sounds Musculoskeletal:no cyanosis of digits and no clubbing  NEURO: alert & oriented x 3 with fluent speech, no focal motor/sensory deficits  LABORATORY DATA: Results for orders placed in visit on 05/11/13 (from the past 48 hour(s))  CBC WITH DIFFERENTIAL     Status: Abnormal   Collection Time    05/11/13  9:32 AM      Result Value Range   WBC 9.5  3.9 - 10.3 10e3/uL   NEUT# 7.1 (*) 1.5 - 6.5 10e3/uL   HGB 12.0  11.6 - 15.9 g/dL   HCT 36.3  34.8 - 46.6 %   Platelets 292  145 - 400 10e3/uL   MCV 97.7  79.5 - 101.0 fL   MCH 32.4  25.1 - 34.0 pg   MCHC 33.2  31.5 - 36.0 g/dL   RBC 3.71  3.70 - 5.45 10e6/uL   RDW 18.8 (*) 11.2 - 14.5 %   lymph# 1.3  0.9 - 3.3 10e3/uL   MONO# 0.8  0.1 - 0.9 10e3/uL   Eosinophils Absolute 0.2  0.0 - 0.5 10e3/uL   Basophils Absolute 0.1  0.0 - 0.1 10e3/uL   NEUT% 74.3  38.4 - 76.8 %   LYMPH% 13.9 (*) 14.0 - 49.7 %   MONO% 8.7  0.0 - 14.0 %   EOS% 2.0  0.0 - 7.0 %   BASO% 1.1  0.0 - 2.0 %  PROTIME-INR     Status: Abnormal   Collection Time    05/11/13  9:32 AM      Result Value Range   Protime 21.6 (*) 10.6 - 13.4 Seconds   INR 1.80 (*) 2.00 - 3.50   Comment: INR is useful only to assess adequacy of anticoagulation with coumadin when comparing results from different labs. It should not be used to estimate bleeding risk or presence/abscense of coagulopathy in patients not on coumadin. Expected INR ranges for      nontherapeutic patients is 0.88 - 1.12.   Lovenox No    COMPREHENSIVE METABOLIC PANEL (0000000)     Status: Abnormal   Collection Time    05/11/13  9:32 AM      Result Value Range   Sodium 143  136 - 145 mEq/L   Potassium 4.5  3.5 - 5.1 mEq/L   Chloride 107  98 - 109 mEq/L    CO2 23  22 - 29 mEq/L   Glucose 147 (*) 70 - 140 mg/dl   BUN 11.0  7.0 - 26.0 mg/dL   Creatinine 1.3 (*) 0.6 - 1.1 mg/dL   Total Bilirubin 0.24  0.20 - 1.20 mg/dL   Alkaline Phosphatase 67  40 - 150 U/L   AST 16  5 - 34 U/L   ALT 15  0 - 55 U/L   Total Protein 7.1  6.4 - 8.3 g/dL   Albumin 4.2  3.5 - 5.0 g/dL   Calcium 10.6 (*) 8.4 - 10.4 mg/dL   Anion Gap 13 (*) 3 - 11 mEq/L    Labs:  Lab Results  Component Value Date   WBC 9.5 05/11/2013   HGB 12.0 05/11/2013   HCT 36.3 05/11/2013   MCV 97.7 05/11/2013   PLT 292 05/11/2013   NEUTROABS 7.1* 05/11/2013      Chemistry      Component Value Date/Time   NA 143 05/11/2013 0932   NA 139 04/19/2013 1420   NA 141 10/12/2011 0957   K 4.5 05/11/2013 0932   K 4.8 04/19/2013 1420   K 4.4 10/12/2011 0957   CL 101 04/19/2013 1420   CL 102 09/21/2012 0917   CL 95* 10/12/2011 0957   CO2 23 05/11/2013 0932   CO2 32 04/19/2013 1420   CO2 29 10/12/2011 0957   BUN 11.0 05/11/2013 0932   BUN 22 04/19/2013 1420   BUN 15 10/12/2011 0957   CREATININE 1.3* 05/11/2013 0932   CREATININE 1.10 04/19/2013 1420   CREATININE 1.15* 02/22/2013 0435      Component Value Date/Time   CALCIUM 10.6* 05/11/2013 0932   CALCIUM 10.4 04/19/2013 1420   CALCIUM 9.1 10/12/2011 0957   ALKPHOS 67 05/11/2013 0932   ALKPHOS 49 02/11/2013 2333   ALKPHOS 65 10/12/2011 0957   AST 16 05/11/2013 0932   AST 11 02/11/2013 2333   AST 19 10/12/2011 0957   ALT 15 05/11/2013 0932   ALT 19 02/11/2013 2333   ALT 20 10/12/2011 0957   BILITOT 0.24 05/11/2013 0932   BILITOT 0.5 02/11/2013 2333   BILITOT 0.60 10/12/2011 0957     Basic Metabolic Panel:  Recent Labs Lab 05/11/13 0932  NA 143  K 4.5  CO2 23  GLUCOSE 147*  BUN 11.0  CREATININE 1.3*  CALCIUM 10.6*   GFR Estimated Creatinine Clearance: 37.7 ml/min (by C-G formula based on Cr of 1.3). Liver Function Tests:  Recent Labs Lab 05/11/13 0932  AST 16  ALT 15  ALKPHOS  64  BILITOT 0.24  PROT 7.1  ALBUMIN 4.2   Coagulation profile  Recent  Labs Lab 05/11/13 0932  INR 1.80*  PROTIME 21.6*    CBC:  Recent Labs Lab 05/11/13 0932  WBC 9.5  NEUTROABS 7.1*  HGB 12.0  HCT 36.3  MCV 97.7  PLT 292    RADIOGRAPHIC STUDIES: (Personally reviewed by me on prior visit) (03/27/2014)CT CHEST, ABDOMEN, AND PELVIS WITH CONTRAST TECHNIQUE: Multidetector CT imaging of the chest, abdomen and pelvis was performed following the standard protocol during bolus  administration of intravenous contrast. CONTRAST: 1102mL OMNIPAQUE IOHEXOL 300 MG/ML SOLN, 60mL OMNIPAQUE IOHEXOL 300 MG/ML SOLN COMPARISON: CT of the chest, abdomen and pelvis 12/23/2012. FINDINGS: CT CHEST FINDINGS Mediastinum: Heart size is mildly enlarged. Left-sided pacemaker device in place with lead tips terminating in the right atrial appendage and right ventricular apex. There is no significant pericardial fluid, thickening or pericardial calcification. No pathologically enlarged mediastinal or hilar lymph nodes. Multiple prominent but nonenlarged anterior mediastinal lymph nodes appear similar to prior examinations and are highly nonspecific. Esophagus is unremarkable in appearance. Right subclavian single-lumen porta cath with tip terminating in the right atrium. Lungs/Pleura: No acute consolidative airspace disease. No pleural effusions. No suspicious appearing pulmonary nodules or masses are identified at this time. Musculoskeletal: There are no aggressive appearing lytic or blastic lesions noted in the visualized portions of the skeleton. No axillary lymphadenopathy. CT ABDOMEN AND PELVIS FINDINGS Abdomen/Pelvis: Status postcholecystectomy. The appearance of the liver, pancreas, spleen, bilateral adrenal glands and bilateral kidneys is unremarkable. Previously noted retroperitoneal lymphadenopathy appear slightly increased compared to the prior study. This is best demonstrated on image 66 of series 2 where there is a nodal mass intimately associated with both the infrarenal abdominal  aorta and the inferior vena cava measuring approximately 4.5 x 2.0 cm. More inferiorly, on the left side of the infrarenal abdominal aorta there is also an enlarging lymph node measuring 1.8 x 1.5 cm (image 73 of series 2). A significant amount of amorphous soft tissue (presumably nodal tissue) surrounds much of the infrarenal abdominal aorta and intrahepatic inferior vena cava, similar to prior studies. An enlarged ileocolic lymph node is  similar to the prior examination, currently measuring 2.1 x 1.8 cm (image 73 of series 2). There are few scattered colonic diverticulae, without surrounding inflammatory changes to suggest an acute diverticulitis at this time. No significant volume of ascites. No pneumoperitoneum. No pathologic distention of small bowel. Status post hysterectomy. Ovaries are not confidently identified may be surgically absent or atrophic. Urinary bladder is nearly completely decompressed, but otherwise unremarkable in appearance. Musculoskeletal: There are no aggressive appearing lytic or blastic lesions noted in the visualized portions of the skeleton. IMPRESSION: 1. Retroperitoneal disease appears slightly progressed compared to the prior study, with increasing periaortic/pericaval lymphadenopathy, as discussed above. 2. Enlarged ileocolic lymph node is essentially unchanged compared to the prior study. 3. No new lymphadenopathy is otherwise identified on today's study. 4. Colonic diverticulosis without findings to suggest acute diverticulitis at this time. 5. Additional incidental findings, as above.  ASSESSMENT: [name] 73 y.o. female with a history of Non-Hodgkin's lymphoma of abdomen - Plan: CBC with Differential, Comprehensive metabolic panel (Cmet) - [facility], Lactate dehydrogenase (LDH) - [facility]  Atrial fibrillation  Anemia of chronic disease  Antineoplastic chemotherapy induced pancytopenia  PLAN:  1. Relapsed high grade non-hodgkin's B cell Lymphoma on salvage therapy,  progressed on salvage R-GDP  -She completed 5 cycles of R+DGP q 21 days (modified doses based  on dehydration, electrolyte abnormalities) and we reviewed the CT Scans of C/A/P (12/15) which was consistent with a further mild  increase in retroperitoneal lymphadenopathy. She completed R+DGP for cycles # 5 on 03/14/2013 followed by neulasta (12/03) based on her prior high risk of infection and prior episode of febrile neutropenia. Given her significant co-morbidities, I favor against bone marrow transplant but will refer back to [facility] for further evaluation. She was seen by Dr. Cassell Clement in the past.  We will consider next salvagetherapy R-CEPP (dose reduced) based on her prior treatments with  R-CHOP, Bendumustin/Rituximab and R-DGP.    It will be as follows based Gevena Barre, et al, Blood 1990;[id number] with dose modification as indicated.   Rituximab 375 mg/m2 Cyclophosphamide 300 mg/m2 (50% of dose) on days 1 and 8; Etoposide 35 mg/m2 (50% of dose) on days 1,2, and 3.  Prednisone  30 mg/m2 (50% of dose) on days 1 through 10; Procarbazine 30 mg/m2 (50% of dose) orally on days 1 through 10;   We will re-scan with PET/CT as baseline prior to next treatment.   We will also have Dr. Cassell Clement weigh in on consideration of further therapy.    2.  Anemia/Thrombocytopenia secondary chemotherapy -- Her counts have recovered.  She is asymptomatic for anemia.   3. Elevated creatinine.  --Creatinine is 1.3 today (Range 1.1 to 1.6 over the past one year). We encouraged aggressive hydration and avoidance of nephrotoxins.   We will continue to closely monitor her labs weekly.   4. Hypothyroidism: Levothyroxine per PCP  5.  Hypertension: Diltiazem per PCP.   6. Diabetes mellitus, type II: Metformin per PCP.   7. Hyperlipidemia: Pravastatin per PCP.   8. History of atrial fibrillation: Diltiazem, pacemaker. Coumadin being managed per her PCP.  9. COPD/ Chronic Bronchitis exacerbation. --Spiriva prescribed by  me (with discussion of indications and common side-effects including but not limited to chest pain, dyspepsia, abdominal pain, headache, vomiting, edema) with close follow up recommended with her PCP and possible referral to pulmonary prn.  Patient advised to continue albuterol q 6 hours as needed.    11. Follow-up in 4 weeks to consent and evaluation for treatment with salvage R-CEPP.  We will obtain CBC, CMP.  Refer back to [facility] for consideration of BMT evaluation.   All questions were answered. The patient knows to call the clinic with any problems, questions or concerns. We can certainly see the patient much sooner if necessary.  I spent 15 minutes counseling the patient face to face. The total time spent in the appointment was 25 minutes.    Jarica Plass, MD 05/11/2013 11:34 AM

## 2013-05-22 ENCOUNTER — Other Ambulatory Visit: Payer: Self-pay | Admitting: Internal Medicine

## 2013-05-30 ENCOUNTER — Other Ambulatory Visit: Payer: Self-pay | Admitting: Internal Medicine

## 2013-05-30 DIAGNOSIS — C8593 Non-Hodgkin lymphoma, unspecified, intra-abdominal lymph nodes: Secondary | ICD-10-CM

## 2013-06-01 ENCOUNTER — Ambulatory Visit (HOSPITAL_COMMUNITY): Payer: Medicare Other

## 2013-06-05 ENCOUNTER — Other Ambulatory Visit: Payer: Self-pay | Admitting: *Deleted

## 2013-06-05 ENCOUNTER — Telehealth: Payer: Self-pay | Admitting: Internal Medicine

## 2013-06-05 ENCOUNTER — Ambulatory Visit (HOSPITAL_COMMUNITY)
Admission: RE | Admit: 2013-06-05 | Discharge: 2013-06-05 | Disposition: A | Discharge status: Home / Self Care | Payer: Medicare Other | Source: Ambulatory Visit | Attending: Internal Medicine | Admitting: Internal Medicine

## 2013-06-05 ENCOUNTER — Other Ambulatory Visit: Payer: Self-pay | Admitting: Internal Medicine

## 2013-06-05 ENCOUNTER — Encounter (HOSPITAL_COMMUNITY): Payer: Self-pay

## 2013-06-05 DIAGNOSIS — R599 Enlarged lymph nodes, unspecified: Secondary | ICD-10-CM | POA: Insufficient documentation

## 2013-06-05 DIAGNOSIS — C8589 Other specified types of non-Hodgkin lymphoma, extranodal and solid organ sites: Secondary | ICD-10-CM | POA: Insufficient documentation

## 2013-06-05 DIAGNOSIS — C8593 Non-Hodgkin lymphoma, unspecified, intra-abdominal lymph nodes: Secondary | ICD-10-CM

## 2013-06-05 DIAGNOSIS — N1330 Unspecified hydronephrosis: Secondary | ICD-10-CM

## 2013-06-05 MED ORDER — IOHEXOL 300 MG/ML  SOLN
125.0000 mL | Freq: Once | INTRAMUSCULAR | Status: AC | PRN
  Administered 2013-06-05: 125 mL via INTRAVENOUS

## 2013-06-05 MED ORDER — IOHEXOL 300 MG/ML  SOLN
50.0000 mL | Freq: Once | INTRAMUSCULAR | Status: AC | PRN
  Administered 2013-06-05: 50 mL via ORAL

## 2013-06-05 NOTE — Telephone Encounter (Signed)
Made patient aware of CT of C/A/P results.  R kidney with moderate to severe hydronephrosis due to progressive disease.  Will make Dr. Cassell Clement aware and follow-up in the next couple of days.  Referral to IR for possible stent placement prior to treatment.

## 2013-06-05 NOTE — Telephone Encounter (Signed)
In error

## 2013-06-06 ENCOUNTER — Other Ambulatory Visit: Payer: Self-pay | Admitting: Medical Oncology

## 2013-06-06 ENCOUNTER — Telehealth: Payer: Self-pay | Admitting: Internal Medicine

## 2013-06-06 DIAGNOSIS — C8593 Non-Hodgkin lymphoma, unspecified, intra-abdominal lymph nodes: Secondary | ICD-10-CM

## 2013-06-06 NOTE — Telephone Encounter (Signed)
, °

## 2013-06-06 NOTE — Telephone Encounter (Signed)
Discussed case with Dr. Cassell Clement and faxed copies of CT of abdomen and pelvis results.  Patient has disease progression and will therefore not be a candidate at this time.  She agreed with dose reduced R-CEPP and if that did not work Rituxan plus Revlimid.  I will have her back in the office this week and a referral has been made for possible stent placement.

## 2013-06-08 ENCOUNTER — Other Ambulatory Visit: Payer: Self-pay | Admitting: Radiology

## 2013-06-09 ENCOUNTER — Encounter (HOSPITAL_COMMUNITY): Payer: Self-pay | Admitting: Pharmacy Technician

## 2013-06-09 ENCOUNTER — Ambulatory Visit: Payer: Medicare Other

## 2013-06-12 ENCOUNTER — Ambulatory Visit: Payer: Medicare Other

## 2013-06-12 ENCOUNTER — Telehealth: Payer: Self-pay

## 2013-06-12 ENCOUNTER — Telehealth: Payer: Self-pay | Admitting: Internal Medicine

## 2013-06-12 ENCOUNTER — Other Ambulatory Visit: Payer: Self-pay

## 2013-06-12 ENCOUNTER — Ambulatory Visit (HOSPITAL_BASED_OUTPATIENT_CLINIC_OR_DEPARTMENT_OTHER): Payer: Medicare Other

## 2013-06-12 ENCOUNTER — Other Ambulatory Visit: Payer: Self-pay | Admitting: Internal Medicine

## 2013-06-12 ENCOUNTER — Ambulatory Visit (HOSPITAL_BASED_OUTPATIENT_CLINIC_OR_DEPARTMENT_OTHER): Payer: Medicare Other | Admitting: Internal Medicine

## 2013-06-12 VITALS — BP 121/59 | HR 74 | Temp 98.0°F | Resp 18 | Ht <= 58 in | Wt 220.4 lb

## 2013-06-12 DIAGNOSIS — C8593 Non-Hodgkin lymphoma, unspecified, intra-abdominal lymph nodes: Secondary | ICD-10-CM

## 2013-06-12 DIAGNOSIS — E119 Type 2 diabetes mellitus without complications: Secondary | ICD-10-CM

## 2013-06-12 DIAGNOSIS — I10 Essential (primary) hypertension: Secondary | ICD-10-CM

## 2013-06-12 DIAGNOSIS — N1330 Unspecified hydronephrosis: Secondary | ICD-10-CM

## 2013-06-12 DIAGNOSIS — T451X5A Adverse effect of antineoplastic and immunosuppressive drugs, initial encounter: Secondary | ICD-10-CM

## 2013-06-12 DIAGNOSIS — C8589 Other specified types of non-Hodgkin lymphoma, extranodal and solid organ sites: Secondary | ICD-10-CM

## 2013-06-12 DIAGNOSIS — R944 Abnormal results of kidney function studies: Secondary | ICD-10-CM

## 2013-06-12 DIAGNOSIS — C8583 Other specified types of non-Hodgkin lymphoma, intra-abdominal lymph nodes: Secondary | ICD-10-CM

## 2013-06-12 DIAGNOSIS — E039 Hypothyroidism, unspecified: Secondary | ICD-10-CM

## 2013-06-12 DIAGNOSIS — J449 Chronic obstructive pulmonary disease, unspecified: Secondary | ICD-10-CM

## 2013-06-12 DIAGNOSIS — I4891 Unspecified atrial fibrillation: Secondary | ICD-10-CM

## 2013-06-12 DIAGNOSIS — J441 Chronic obstructive pulmonary disease with (acute) exacerbation: Secondary | ICD-10-CM

## 2013-06-12 DIAGNOSIS — D61810 Antineoplastic chemotherapy induced pancytopenia: Secondary | ICD-10-CM

## 2013-06-12 LAB — CBC WITH DIFFERENTIAL/PLATELET
BASO%: 1.4 % (ref 0.0–2.0)
BASOS ABS: 0 10*3/uL (ref 0.0–0.1)
EOS ABS: 0.1 10*3/uL (ref 0.0–0.5)
EOS%: 4.3 % (ref 0.0–7.0)
HEMATOCRIT: 32.9 % — AB (ref 34.8–46.6)
HEMOGLOBIN: 10.6 g/dL — AB (ref 11.6–15.9)
LYMPH%: 35.4 % (ref 14.0–49.7)
MCH: 30.7 pg (ref 25.1–34.0)
MCHC: 32.1 g/dL (ref 31.5–36.0)
MCV: 95.6 fL (ref 79.5–101.0)
MONO#: 0.6 10*3/uL (ref 0.1–0.9)
MONO%: 18.5 % — ABNORMAL HIGH (ref 0.0–14.0)
NEUT%: 40.4 % (ref 38.4–76.8)
NEUTROS ABS: 1.3 10*3/uL — AB (ref 1.5–6.5)
Platelets: 281 10*3/uL (ref 145–400)
RBC: 3.44 10*6/uL — ABNORMAL LOW (ref 3.70–5.45)
RDW: 17.3 % — AB (ref 11.2–14.5)
WBC: 3.1 10*3/uL — ABNORMAL LOW (ref 3.9–10.3)
lymph#: 1.1 10*3/uL (ref 0.9–3.3)

## 2013-06-12 LAB — COMPREHENSIVE METABOLIC PANEL (CC13)
ALBUMIN: 3.6 g/dL (ref 3.5–5.0)
ALK PHOS: 70 U/L (ref 40–150)
ALT: 12 U/L (ref 0–55)
AST: 14 U/L (ref 5–34)
Anion Gap: 9 mEq/L (ref 3–11)
BUN: 21.6 mg/dL (ref 7.0–26.0)
CO2: 28 mEq/L (ref 22–29)
CREATININE: 1.7 mg/dL — AB (ref 0.6–1.1)
Calcium: 10.4 mg/dL (ref 8.4–10.4)
Chloride: 105 mEq/L (ref 98–109)
GLUCOSE: 101 mg/dL (ref 70–140)
POTASSIUM: 4.5 meq/L (ref 3.5–5.1)
Sodium: 141 mEq/L (ref 136–145)
Total Bilirubin: 0.24 mg/dL (ref 0.20–1.20)
Total Protein: 6.5 g/dL (ref 6.4–8.3)

## 2013-06-12 LAB — LACTATE DEHYDROGENASE (CC13): LDH: 215 U/L (ref 125–245)

## 2013-06-12 MED ORDER — LIDOCAINE-PRILOCAINE 2.5-2.5 % EX CREA: TOPICAL_CREAM | CUTANEOUS | Status: DC | PRN

## 2013-06-12 NOTE — Telephone Encounter (Signed)
S/w niece, questions answered. Dr [name] wanted pt scheduled sooner than 3/16. Pt able to come in at 1 pm today in cancellation slot. POF and call to scheduler.

## 2013-06-12 NOTE — Patient Instructions (Signed)
Carboplatin injection What is this medicine? CARBOPLATIN (KAR boe pla tin) is a chemotherapy drug. It targets fast dividing cells, like cancer cells, and causes these cells to die. This medicine is used to treat ovarian cancer and many other cancers. This medicine may be used for other purposes; ask your health care provider or pharmacist if you have questions. COMMON BRAND NAME(S): Paraplatin What should I tell my health care provider before I take this medicine? They need to know if you have any of these conditions: -blood disorders -hearing problems -kidney disease -recent or ongoing radiation therapy -an unusual or allergic reaction to carboplatin, cisplatin, other chemotherapy, other medicines, foods, dyes, or preservatives -pregnant or trying to get pregnant -breast-feeding How should I use this medicine? This drug is usually given as an infusion into a vein. It is administered in a hospital or clinic by a specially trained health care professional. Talk to your pediatrician regarding the use of this medicine in children. Special care may be needed. Overdosage: If you think you have taken too much of this medicine contact a poison control center or emergency room at once. NOTE: This medicine is only for you. Do not share this medicine with others. What if I miss a dose? It is important not to miss a dose. Call your doctor or health care professional if you are unable to keep an appointment. What may interact with this medicine? -medicines for seizures -medicines to increase blood counts like filgrastim, pegfilgrastim, sargramostim -some antibiotics like amikacin, gentamicin, neomycin, streptomycin, tobramycin -vaccines Talk to your doctor or health care professional before taking any of these medicines: -acetaminophen -aspirin -ibuprofen -ketoprofen -naproxen This list may not describe all possible interactions. Give your health care provider a list of all the medicines, herbs,  non-prescription drugs, or dietary supplements you use. Also tell them if you smoke, drink alcohol, or use illegal drugs. Some items may interact with your medicine. What should I watch for while using this medicine? Your condition will be monitored carefully while you are receiving this medicine. You will need important blood work done while you are taking this medicine. This drug may make you feel generally unwell. This is not uncommon, as chemotherapy can affect healthy cells as well as cancer cells. Report any side effects. Continue your course of treatment even though you feel ill unless your doctor tells you to stop. In some cases, you may be given additional medicines to help with side effects. Follow all directions for their use. Call your doctor or health care professional for advice if you get a fever, chills or sore throat, or other symptoms of a cold or flu. Do not treat yourself. This drug decreases your body's ability to fight infections. Try to avoid being around people who are sick. This medicine may increase your risk to bruise or bleed. Call your doctor or health care professional if you notice any unusual bleeding. Be careful brushing and flossing your teeth or using a toothpick because you may get an infection or bleed more easily. If you have any dental work done, tell your dentist you are receiving this medicine. Avoid taking products that contain aspirin, acetaminophen, ibuprofen, naproxen, or ketoprofen unless instructed by your doctor. These medicines may hide a fever. Do not become pregnant while taking this medicine. Women should inform their doctor if they wish to become pregnant or think they might be pregnant. There is a potential for serious side effects to an unborn child. Talk to your health care professional or  pharmacist for more information. Do not breast-feed an infant while taking this medicine. What side effects may I notice from receiving this medicine? Side effects  that you should report to your doctor or health care professional as soon as possible: -allergic reactions like skin rash, itching or hives, swelling of the face, lips, or tongue -signs of infection - fever or chills, cough, sore throat, pain or difficulty passing urine -signs of decreased platelets or bleeding - bruising, pinpoint red spots on the skin, black, tarry stools, nosebleeds -signs of decreased red blood cells - unusually weak or tired, fainting spells, lightheadedness -breathing problems -changes in hearing -changes in vision -chest pain -high blood pressure -low blood counts - This drug may decrease the number of white blood cells, red blood cells and platelets. You may be at increased risk for infections and bleeding. -nausea and vomiting -pain, swelling, redness or irritation at the injection site -pain, tingling, numbness in the hands or feet -problems with balance, talking, walking -trouble passing urine or change in the amount of urine Side effects that usually do not require medical attention (report to your doctor or health care professional if they continue or are bothersome): -hair loss -loss of appetite -metallic taste in the mouth or changes in taste This list may not describe all possible side effects. Call your doctor for medical advice about side effects. You may report side effects to FDA at 1-800-FDA-1088. Where should I keep my medicine? This drug is given in a hospital or clinic and will not be stored at home. NOTE: This sheet is a summary. It may not cover all possible information. If you have questions about this medicine, talk to your doctor, pharmacist, or health care provider.  2014, [employer] Standard. (2007-07-05 14:38:05) Etoposide, VP-16 injection What is this medicine? ETOPOSIDE, VP-16 (e toe POE side) is a chemotherapy drug. It is used to treat testicular cancer, lung cancer, and other cancers. This medicine may be used for other purposes;  ask your health care provider or pharmacist if you have questions. COMMON BRAND NAME(S): Etopophos, Toposar, VePesid What should I tell my health care provider before I take this medicine? They need to know if you have any of these conditions: -infection -kidney disease -low blood counts, like low white cell, platelet, or red cell counts -an unusual or allergic reaction to etoposide, other chemotherapeutic agents, other medicines, foods, dyes, or preservatives -pregnant or trying to get pregnant -breast-feeding How should I use this medicine? This medicine is for infusion into a vein. It is administered in a hospital or clinic by a specially trained health care professional. Talk to your pediatrician regarding the use of this medicine in children. Special care may be needed. Overdosage: If you think you have taken too much of this medicine contact a poison control center or emergency room at once. NOTE: This medicine is only for you. Do not share this medicine with others. What if I miss a dose? It is important not to miss your dose. Call your doctor or health care professional if you are unable to keep an appointment. What may interact with this medicine? -cyclosporine -medicines to increase blood counts like filgrastim, pegfilgrastim, sargramostim -vaccines This list may not describe all possible interactions. Give your health care provider a list of all the medicines, herbs, non-prescription drugs, or dietary supplements you use. Also tell them if you smoke, drink alcohol, or use illegal drugs. Some items may interact with your medicine. What should I watch for while using this  medicine? Visit your doctor for checks on your progress. This drug may make you feel generally unwell. This is not uncommon, as chemotherapy can affect healthy cells as well as cancer cells. Report any side effects. Continue your course of treatment even though you feel ill unless your doctor tells you to stop. In  some cases, you may be given additional medicines to help with side effects. Follow all directions for their use. Call your doctor or health care professional for advice if you get a fever, chills or sore throat, or other symptoms of a cold or flu. Do not treat yourself. This drug decreases your body's ability to fight infections. Try to avoid being around people who are sick. This medicine may increase your risk to bruise or bleed. Call your doctor or health care professional if you notice any unusual bleeding. Be careful brushing and flossing your teeth or using a toothpick because you may get an infection or bleed more easily. If you have any dental work done, tell your dentist you are receiving this medicine. Avoid taking products that contain aspirin, acetaminophen, ibuprofen, naproxen, or ketoprofen unless instructed by your doctor. These medicines may hide a fever. Do not become pregnant while taking this medicine. Women should inform their doctor if they wish to become pregnant or think they might be pregnant. There is a potential for serious side effects to an unborn child. Talk to your health care professional or pharmacist for more information. Do not breast-feed an infant while taking this medicine. What side effects may I notice from receiving this medicine? Side effects that you should report to your doctor or health care professional as soon as possible: -allergic reactions like skin rash, itching or hives, swelling of the face, lips, or tongue -low blood counts - this medicine may decrease the number of white blood cells, red blood cells and platelets. You may be at increased risk for infections and bleeding. -signs of infection - fever or chills, cough, sore throat, pain or difficulty passing urine -signs of decreased platelets or bleeding - bruising, pinpoint red spots on the skin, black, tarry stools, blood in the urine -signs of decreased red blood cells - unusually weak or tired,  fainting spells, lightheadedness -breathing problems -changes in vision -mouth or throat sores or ulcers -pain, redness, swelling or irritation at the injection site -pain, tingling, numbness in the hands or feet -redness, blistering, peeling or loosening of the skin, including inside the mouth -seizures -vomiting Side effects that usually do not require medical attention (report to your doctor or health care professional if they continue or are bothersome): -diarrhea -hair loss -loss of appetite -nausea -stomach pain This list may not describe all possible side effects. Call your doctor for medical advice about side effects. You may report side effects to FDA at 1-800-FDA-1088. Where should I keep my medicine? This drug is given in a hospital or clinic and will not be stored at home. NOTE: This sheet is a summary. It may not cover all possible information. If you have questions about this medicine, talk to your doctor, pharmacist, or health care provider.  2014, [employer] Standard. (2007-08-01 17:24:12) Ifosfamide injection What is this medicine? IFOSFAMIDE (eye FOS fa mide) is a chemotherapy drug. It slows the growth of cancer cells. This medicine is used to treat testicular cancer. This medicine may be used for other purposes; ask your health care provider or pharmacist if you have questions. COMMON BRAND NAME(S): Ifex, Ifex/Mesna What should I tell my health  care provider before I take this medicine? They need to know if you have any of these conditions: -bladder problems -blood disorders -dehydration -infection (especially a virus infection such as chickenpox, cold sores, or herpes) -kidney disease -liver disease -recent or ongoing radiation therapy -an unusual or allergic reaction to ifosfamide, other chemotherapy, other medicines, foods, dyes, or preservatives -pregnant or trying to get pregnant -breast-feeding How should I use this medicine? This drug is given as an  infusion into a vein. It is administered in a hospital or clinic by a specially trained health care professional. Talk to your pediatrician regarding the use of this medicine in children. Special care may be needed. Overdosage: If you think you have taken too much of this medicine contact a poison control center or emergency room at once. NOTE: This medicine is only for you. Do not share this medicine with others. What if I miss a dose? It is important not to miss your dose. Call your doctor or health care professional if you are unable to keep an appointment. What may interact with this medicine? Do not take this medicine with any of the following medications: -nalidixic acid This medicine may also interact with the following medications: -medicines to increase blood counts like filgrastim, pegfilgrastim, sargramostim -St. John's Wort -vaccines Talk to your doctor or health care professional before taking any of these medicines: -acetaminophen -aspirin -ibuprofen -ketoprofen -naproxen This list may not describe all possible interactions. Give your health care provider a list of all the medicines, herbs, non-prescription drugs, or dietary supplements you use. Also tell them if you smoke, drink alcohol, or use illegal drugs. Some items may interact with your medicine. What should I watch for while using this medicine? Visit your doctor for checks on your progress. This drug may make you feel generally unwell. This is not uncommon, as chemotherapy can affect healthy cells as well as cancer cells. Report any side effects. Continue your course of treatment even though you feel ill unless your doctor tells you to stop. Drink water or other fluids as directed. Urinate often, even at night. In some cases, you may be given additional medicines to help with side effects. Follow all directions for their use. Call your doctor or health care professional for advice if you get a fever, chills or sore  throat, or other symptoms of a cold or flu. Do not treat yourself. This drug decreases your body's ability to fight infections. Try to avoid being around people who are sick. This medicine may increase your risk to bruise or bleed. Call your doctor or health care professional if you notice any unusual bleeding. Be careful brushing and flossing your teeth or using a toothpick because you may get an infection or bleed more easily. If you have any dental work done, tell your dentist you are receiving this medicine. Avoid taking products that contain aspirin, acetaminophen, ibuprofen, naproxen, or ketoprofen unless instructed by your doctor. These medicines may hide a fever. Do not become pregnant while taking this medicine. Women should inform their doctor if they wish to become pregnant or think they might be pregnant. There is a potential for serious side effects to an unborn child. Talk to your health care professional or pharmacist for more information. Do not breast-feed an infant while taking this medicine. What side effects may I notice from receiving this medicine? Side effects that you should report to your doctor or health care professional as soon as possible: -allergic reactions like skin rash, itching or  hives, swelling of the face, lips, or tongue -low blood counts - this medicine may decrease the number of white blood cells, red blood cells and platelets. You may be at increased risk for infections and bleeding. -signs of infection - fever or chills, cough, sore throat, pain or difficulty passing urine -signs of decreased platelets or bleeding - bruising, pinpoint red spots on the skin, black, tarry stools, blood in the urine -signs of decreased red blood cells - unusually weak or tired, fainting spells, lightheadedness -agitation -breathing problems -confusion -dark urine -hallucinations -mouth sores -pain, swelling, redness at site where injected -seizures -trouble passing urine or  change in the amount of urine -yellowing of the eyes or skin Side effects that usually do not require medical attention (report to your doctor or health care professional if they continue or are bothersome): -diarrhea -hair loss -loss of appetite -nausea, vomiting This list may not describe all possible side effects. Call your doctor for medical advice about side effects. You may report side effects to FDA at 1-800-FDA-1088. Where should I keep my medicine? This drug is given in a hospital or clinic and will not be stored at home. NOTE: This sheet is a summary. It may not cover all possible information. If you have questions about this medicine, talk to your doctor, pharmacist, or health care provider.  2014, [employer] Standard. (2007-08-16 16:23:05) Rituximab injection What is this medicine? RITUXIMAB (ri TUX i mab) is a monoclonal antibody. This medicine changes the way the body's immune system works. It is used commonly to treat non-Hodgkin's lymphoma and other conditions. In cancer cells, this drug targets a specific protein within cancer cells and stops the cancer cells from growing. It is also used to treat rhuematoid arthritis (RA). In RA, this medicine slow the inflammatory process and help reduce joint pain and swelling. This medicine is often used with other cancer or arthritis medications. This medicine may be used for other purposes; ask your health care provider or pharmacist if you have questions. COMMON BRAND NAME(S): Rituxan What should I tell my health care provider before I take this medicine? They need to know if you have any of these conditions: -blood disorders -heart disease -history of hepatitis B -infection (especially a virus infection such as chickenpox, cold sores, or herpes) -irregular heartbeat -kidney disease -lung or breathing disease, like asthma -lupus -an unusual or allergic reaction to rituximab, mouse proteins, other medicines, foods, dyes, or  preservatives -pregnant or trying to get pregnant -breast-feeding How should I use this medicine? This medicine is for infusion into a vein. It is administered in a hospital or clinic by a specially trained health care professional. A special MedGuide will be given to you by the pharmacist with each prescription and refill. Be sure to read this information carefully each time. Talk to your pediatrician regarding the use of this medicine in children. This medicine is not approved for use in children. Overdosage: If you think you have taken too much of this medicine contact a poison control center or emergency room at once. NOTE: This medicine is only for you. Do not share this medicine with others. What if I miss a dose? It is important not to miss a dose. Call your doctor or health care professional if you are unable to keep an appointment. What may interact with this medicine? -cisplatin -medicines for blood pressure -some other medicines for arthritis -vaccines This list may not describe all possible interactions. Give your health care provider a list of  all the medicines, herbs, non-prescription drugs, or dietary supplements you use. Also tell them if you smoke, drink alcohol, or use illegal drugs. Some items may interact with your medicine. What should I watch for while using this medicine? Report any side effects that you notice during your treatment right away, such as changes in your breathing, fever, chills, dizziness or lightheadedness. These effects are more common with the first dose. Visit your prescriber or health care professional for checks on your progress. You will need to have regular blood work. Report any other side effects. The side effects of this medicine can continue after you finish your treatment. Continue your course of treatment even though you feel ill unless your doctor tells you to stop. Call your doctor or health care professional for advice if you get a fever,  chills or sore throat, or other symptoms of a cold or flu. Do not treat yourself. This drug decreases your body's ability to fight infections. Try to avoid being around people who are sick. This medicine may increase your risk to bruise or bleed. Call your doctor or health care professional if you notice any unusual bleeding. Be careful brushing and flossing your teeth or using a toothpick because you may get an infection or bleed more easily. If you have any dental work done, tell your dentist you are receiving this medicine. Avoid taking products that contain aspirin, acetaminophen, ibuprofen, naproxen, or ketoprofen unless instructed by your doctor. These medicines may hide a fever. Do not become pregnant while taking this medicine. Women should inform their doctor if they wish to become pregnant or think they might be pregnant. There is a potential for serious side effects to an unborn child. Talk to your health care professional or pharmacist for more information. Do not breast-feed an infant while taking this medicine. What side effects may I notice from receiving this medicine? Side effects that you should report to your doctor or health care professional as soon as possible: -allergic reactions like skin rash, itching or hives, swelling of the face, lips, or tongue -low blood counts - this medicine may decrease the number of white blood cells, red blood cells and platelets. You may be at increased risk for infections and bleeding. -signs of infection - fever or chills, cough, sore throat, pain or difficulty passing urine -signs of decreased platelets or bleeding - bruising, pinpoint red spots on the skin, black, tarry stools, blood in the urine -signs of decreased red blood cells - unusually weak or tired, fainting spells, lightheadedness -breathing problems -confused, not responsive -chest pain -fast, irregular heartbeat -feeling faint or lightheaded, falls -mouth sores -redness,  blistering, peeling or loosening of the skin, including inside the mouth -stomach pain -swelling of the ankles, feet, or hands -trouble passing urine or change in the amount of urine Side effects that usually do not require medical attention (report to your doctor or other health care professional if they continue or are bothersome): -anxiety -headache -loss of appetite -muscle aches -nausea -night sweats This list may not describe all possible side effects. Call your doctor for medical advice about side effects. You may report side effects to FDA at 1-800-FDA-1088. Where should I keep my medicine? This drug is given in a hospital or clinic and will not be stored at home. NOTE: This sheet is a summary. It may not cover all possible information. If you have questions about this medicine, talk to your doctor, pharmacist, or health care provider.  2014, Elsevier/Gold Standard. (2007-11-28  14:04:59)   

## 2013-06-13 ENCOUNTER — Ambulatory Visit (HOSPITAL_COMMUNITY): Admission: RE | Admit: 2013-06-13 | Payer: Medicare Other | Source: Ambulatory Visit

## 2013-06-13 ENCOUNTER — Telehealth: Payer: Self-pay

## 2013-06-13 NOTE — Progress Notes (Signed)
Lymphoma Location(s) / Histology: Non-Hodgkin's lymphoma of abdomen   Patient presented : further moderate increase in retroperitoneal lymphadenopathy complicated by right hydronephrosis.  2/23/2015CT C/A/P consistent with moderate disease progression plus R hydronephosis (moderate,severe). Planning R-ICE salvage chemotherapy followed by transplant [name], Dr. Cassell Clement). 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 She developed recurrent disease; started on bendamustine/Rituxan on 06/29/2012. She completed 3 cycles on 08/25/12. Stopped due to disease progression. Started salvage chemotherapy with R-GPD on 10/26/12 on q 3week cycles. Her last dose was on 03/14/2013 following 5 cycles. She received neupogen on day# 9.    Weight changes, if any, over the past 6 months: 220.4 lbs on 06/12/13.  Lowest weight 211 on 03/03/13.  Recurrent fevers, or drenching night sweats, if any: No  SAFETY ISSUES:  Prior radiation? 05/28/2011 - 06/26/2011 - Right Abdomen and Right Pelvis/3060 cGy in 17 Fractions       Pacemaker/ICD? Pacemaker   Possible current pregnancy? No  Is the patient on methotrexate? No  Current Complaints / other details:

## 2013-06-13 NOTE — Progress Notes (Signed)
[place], MD [address] [place] [address]  DIAGNOSIS: Non-Hodgkin's lymphoma of abdomen - Plan: CBC with Differential, Comprehensive metabolic panel (Cmet) - [facility], Lactate dehydrogenase (LDH) - [facility], Ambulatory referral to Anticoagulation Monitoring  Atrial fibrillation - Plan: Ambulatory referral to Anticoagulation Monitoring  Chief Complaint  Patient presents with  . Non-Hodgkin's lymphoma of abdomen    CURRENT TREATMENT: None.  Planning 4th line chemotherapy with R-ICE.  Pending receipt of R ureteral stent per Urology.    Non-Hodgkin's lymphoma of abdomen   09/23/2010 Imaging CT abdomen and pelvis: 5.5 x 6 cm irregular right abdominal mass with adjacent small lymph nodes likely representing neoplasm.  This does not appear to connect to hollow or solid viscera and may represent a sarcoma.    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    11/03/2010 Initial Diagnosis Non-Hodgkin's lymphoma of abdomen   11/24/2010 Bone Marrow Biopsy No involvment of lymphoma identified.    11/25/2010 Cancer Staging PET/CT: 1.  Interval enlargement of a hypermetabolic ileocolic mesenteric nodal mass. 2.  Hypermetabolic focus corresponding to an enlarging precaval node.  This is consistent with active lymphoma. 3.  No evidence of extra-abdominal disease.   12/08/2010 Procedure R port-a-catheter placment   12/11/2010 - 03/27/2011 Chemotherapy Q 3 week R-CHOP x 6 cyles   04/27/2011 Cancer Staging PET/CT:  1.  Complete response to chemotherapy with no hypermetabolic activity associated with the mesenteric mass or prevascular lymph nodes which are also reduced in size.   05/28/2011 - 06/26/2011 Radiation Therapy Consolidative XRT [name]). Right abdomen and right pelvis/ 3060 cGy in 17 fractions   10/12/2011  Imaging CT C/A/P: 1.  Interval increase in size of aortocaval lymph node measuring 12 mm compared to 6 mm on prior.  This is concerning for mild disease progression. 2.  Mesenteric mass in the ileocecal mesentery is decreased slightly in size.3.  Spleen is normal.   01/18/2012 Imaging CT C/A/P: 1.  Slight interval decrease in size of the mesenteric mass. 2.  Enlarging retroperitoneal lymph node. 3.  No new lymphadenopathy.   05/23/2012 Imaging CT of abdomen: 1.  Significant interval enlargement of a retroperitoneal LN  adjacent to the IVC and infrarenal abdominal aorta, which currently measures 3.9 x 2.9 cm.  Findings are compatible with progression of disease.  No new lymphadenopathy.    05/23/2012 Relapse/Recurrence She had relapsed disease.   06/29/2012 - 08/25/2012 Chemotherapy Completed 3 cycles of bendamustine/Rituxan on 06/29/2012.    08/25/2012 Progression Had disease progression while on Bendamustine/Rituxan   10/03/2012 Procedure Biopsy of R. Perotoneal mass   10/03/2012 Pathology Pathology consistent with high grade NH B-cell lymphoma   10/26/2012 - 03/14/2013 Chemotherapy She had salvage chemotherapy with R-GPD q 3 weeks.  Completed 5 cyles.    02/11/2013 - 02/24/2013 Hospital Admission Admitted secondary to febrile neutropenia.  Her infectious work-up was negative and her thrombocytopenia was prolonged.  She received antibiotics and plts transfusion.    03/27/2013 Imaging CT C/A/P on 12/15 was consistent with mild disease progression.    06/05/2013 Progression CT C/A/P consistent with moderate disease progression plus R hydronephosis (moderate,severe).  Planning R-ICE salvage chemotherapy followed by transplant [name], Dr. Cassell Clement). Referral to Urology     INTERVAL HISTORY: [name] 73 y.o. female returns for follow up visit. She was last seen by me on  05/11/2013.  She had the CT of Chest and abdomen and pelvis on 2/23 consistent with moderate.  These findings were communicated to the  [facility] team who were considering SCT. Today, she is accompanied by her nieces [name].   She denies headaches or visual changes. She is a former longstanding smoker who quit over the past year. She denied headaches, double vision, blurry vision, nasal discharge, hearing problems, odynophagia or dysphagia. No chest pain, palpitations, dyspnea, cough, abdominal pain, vomiting, diarrhea, constipation, hematochezia. The patient denied dysuria, nocturia, polyuria, hematuria, myalgia, numbness, tingling, psychiatric problems. She denies any further bleeding episodes including epitaxis. She denies any fevers or chills. She reports a good appetite.   MEDICAL HISTORY: Past Medical History  Diagnosis Date  . Sick sinus syndrome   . Hematoma     At the site of the pacemaker insertion.  . Hypotension   . Dyslipidemia   . Pacemaker   . History of atrial fibrillation   . Arthritis     Osteoarthritis  . Port-a-cath in place 12/08/10  . Status post chemotherapy completed 03/2011    R - CHOP q 3 weeks x 6  . S/P radiation therapy 05/28/2011 - 06/26/2011    Right Abdomen and Right Pelvis/3060 cGy in 17 Fractions  . Hypertension   . ASHD (arteriosclerotic heart disease)   . Gout   . COPD (chronic obstructive pulmonary disease)   . Asthma   . Hyperlipidemia   . Diabetes mellitus   . Hypothyroidism   . Diffuse large B cell lymphoma   . Cancer 10/2010    large B cell lymphoma  . 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 Non-Hodgkin's lymphoma of abdomen 05/20/2011    INTERIM HISTORY: has Essential hypertension, benign; Atrial fibrillation; Cardiac pacemaker in situ; Diffuse large B cell lymphoma; Hypothyroidism; Diabetes mellitus type 2 in obese; Hyperlipidemia; Non-Hodgkin's lymphoma of abdomen; Depression; Fever and neutropenia; Antineoplastic chemotherapy induced pancytopenia; Acute renal failure; Anemia of chronic disease; Chronic bronchitis; Hypertension; ASHD (arteriosclerotic heart disease); Gout; COPD  (chronic obstructive pulmonary disease); Asthma; and Diabetes mellitus on her problem list.    ALLERGIES:  is allergic to codeine and penicillins.  MEDICATIONS: has a current medication list which includes the following prescription(s): albuterol, allopurinol, vitamin d3, citalopram, diltiazem, guaifenesin, levothyroxine, loperamide, magnesium oxide, metformin, ondansetron, potassium chloride, pravastatin, prochlorperazine, tiotropium, warfarin, and lidocaine-prilocaine.  SURGICAL HISTORY:  Past Surgical History  Procedure Laterality Date  . Pacemaker insertion      Lead revision completed August 04, 2006  . Cholecystectomy    . Tubal ligation      Bilateral  . Insert / replace / remove pacemaker    . Abdominal hysterectomy    . Biopsy stomach  11/03/10    Soft Tissue Mass, Biopsy, Right Lower Quadrant Mesenteric Mass- High Grade Non-Hodgkins B Cell Lymphoma  with Flow Cytometry  . Bone biopsy  11/24/10    Bone Marrow, Aspirate Biospy, and clot, Left - No Involvement of Non-Hodgkin's Lymphoma Identified    REVIEW OF SYSTEMS:   Constitutional: Denies fevers, chills or abnormal weight loss Eyes: Denies blurriness of vision Ears, nose, mouth, throat, and face: Denies mucositis or sore throat Respiratory: Reports a chronic non-productive cough, but denies dyspnea or wheezes Cardiovascular: Denies palpitation, chest discomfort or lower extremity swelling Gastrointestinal:  Denies nausea, heartburn or change in bowel habits Skin: Denies abnormal skin rashes Lymphatics: Denies new lymphadenopathy or easy bruising Neurological:Denies numbness, tingling or new weaknesses Behavioral/Psych: Mood is stable, no new changes  All other systems  were reviewed with the patient and are negative.  PHYSICAL EXAMINATION: ECOG PERFORMANCE STATUS: 1 - Symptomatic but completely ambulatory  Blood pressure 121/59, pulse 74, temperature 98 F (36.7 C), temperature source Oral, resp. rate 18, height 4' 9"   (1.448 m), weight 220 lb 6.4 oz (99.973 kg), SpO2 98.00%.  GENERAL:alert, no distress and comfortable; Obese lady seating in her wheelchair. SKIN: skin color, texture, turgor are normal, no rashes or significant lesions; Port a cath on R.  EYES: normal, Conjunctiva are pink and non-injected, sclera clear OROPHARYNX:no exudate, no erythema and lips, buccal mucosa, and tongue normal  NECK: supple, thyroid normal size, non-tender, without nodularity LYMPH:  no palpable lymphadenopathy in the cervical, axillary or supraclavicular LUNGS: clear to auscultation with normal breathing effort, no wheezes or rhonchi HEART: regular rate & rhythm and no murmurs and no lower extremity edema ABDOMEN:abdomen soft, non-tender and normal bowel sounds Musculoskeletal:no cyanosis of digits and no clubbing  NEURO: alert & oriented x 3 with fluent speech, no focal motor/sensory deficits  Labs:  Lab Results  Component Value Date   WBC 3.1* 06/12/2013   HGB 10.6* 06/12/2013   HCT 32.9* 06/12/2013   MCV 95.6 06/12/2013   PLT 281 06/12/2013   NEUTROABS 1.3* 06/12/2013      Chemistry      Component Value Date/Time   NA 141 06/12/2013 1425   NA 139 04/19/2013 1420   NA 141 10/12/2011 0957   K 4.5 06/12/2013 1425   K 4.8 04/19/2013 1420   K 4.4 10/12/2011 0957   CL 101 04/19/2013 1420   CL 102 09/21/2012 0917   CL 95* 10/12/2011 0957   CO2 28 06/12/2013 1425   CO2 32 04/19/2013 1420   CO2 29 10/12/2011 0957   BUN 21.6 06/12/2013 1425   BUN 22 04/19/2013 1420   BUN 15 10/12/2011 0957   CREATININE 1.7* 06/12/2013 1425   CREATININE 1.10 04/19/2013 1420   CREATININE 1.15* 02/22/2013 0435      Component Value Date/Time   CALCIUM 10.4 06/12/2013 1425   CALCIUM 10.4 04/19/2013 1420   CALCIUM 9.1 10/12/2011 0957   ALKPHOS 70 06/12/2013 1425   ALKPHOS 49 02/11/2013 2333   ALKPHOS 65 10/12/2011 0957   AST 14 06/12/2013 1425   AST 11 02/11/2013 2333   AST 19 10/12/2011 0957   ALT 12 06/12/2013 1425   ALT 19 02/11/2013 2333   ALT 20 10/12/2011 0957   BILITOT  0.24 06/12/2013 1425   BILITOT 0.5 02/11/2013 2333   BILITOT 0.60 10/12/2011 0957       Basic Metabolic Panel:  Recent Labs Lab 06/12/13 1425  NA 141  K 4.5  CO2 28  GLUCOSE 101  BUN 21.6  CREATININE 1.7*  CALCIUM 10.4   GFR Estimated Creatinine Clearance: 29.8 ml/min (by C-G formula based on Cr of 1.7). Liver Function Tests:  Recent Labs Lab 06/12/13 1425  AST 14  ALT 12  ALKPHOS 70  BILITOT 0.24  PROT 6.5  ALBUMIN 3.6    CBC:  Recent Labs Lab 06/12/13 1425  WBC 3.1*  NEUTROABS 1.3*  HGB 10.6*  HCT 32.9*  MCV 95.6  PLT 281    Studies:  No results found.   RADIOGRAPHIC STUDIES: Ct Chest W Contrast  06/05/2013   CLINICAL DATA:  Non-Hodgkin lymphoma.  EXAM: CT CHEST, ABDOMEN, AND PELVIS WITH CONTRAST  TECHNIQUE: Multidetector CT imaging of the chest, abdomen and pelvis was performed following the standard protocol during bolus administration of intravenous contrast.  CONTRAST:  53mL OMNIPAQUE IOHEXOL 300 MG/ML  SOLN  COMPARISON:  CT ABD/PELVIS W CM dated 03/27/2013  FINDINGS: CT CHEST FINDINGS  Right IJ Port-A-Cath terminates in the SVC. Mediastinal lymph nodes are not enlarged by CT size criteria. No hilar or axillary adenopathy. Heart is enlarged. No pericardial effusion. Small hiatal hernia.  Image quality is degraded by respiratory motion. 3 mm right upper lobe nodule is unchanged from 03/27/2013. No pleural fluid. Airway is unremarkable.  CT ABDOMEN AND PELVIS FINDINGS  Liver appears slightly decreased in attenuation diffusely. Cholecystectomy. Adrenal glands are unremarkable. There is slightly decreased attenuation of the right renal parenchyma, when compared with the left, with associated moderate to severe right hydronephrosis. Findings are new from 03/27/2013. Hydronephrosis is seen to the level of a retroperitoneal nodal mass anterior to the inferior vena cava, measuring 2.6 x 3.3 cm (previously 2.0 x 2.4 cm when remeasured). Additional retroperitoneal  adenopathy with ill-defined soft tissue extending along the course of the abdominal aorta and inferior vena cava. This expands to a nodal mass at the aortic bifurcation, measuring 3.2 x 4.6 cm. Findings are progressive from 03/27/2013.  A 10 mm low-attenuation lesion in the upper pole right kidney unchanged. There is lack contrast excretion from the right kidney on delayed nephrographic phase imaging. Left kidney, spleen, pancreas, stomach and small bowel are unremarkable. A fair amount of stool is seen in the colon. Hysterectomy. No free fluid. No worrisome lytic or sclerotic lesions. Degenerative changes are seen in the spine.  IMPRESSION: 1. Enlarging retroperitoneal adenopathy results in moderate to severe right hydronephrosis, with evidence of decreased function in the right kidney. These results will be called to the ordering clinician or representative by the Radiologist Assistant, and communication documented in the PACS Dashboard. 2. Liver appears fatty. 3. Fair amount of stool in the colon is indicative of constipation.   Electronically Signed   By: Lorin Picket M.D.   On: 06/05/2013 16:37   Ct Abdomen Pelvis W Contrast  06/05/2013   CLINICAL DATA:  Non-Hodgkin lymphoma.  EXAM: CT CHEST, ABDOMEN, AND PELVIS WITH CONTRAST  TECHNIQUE: Multidetector CT imaging of the chest, abdomen and pelvis was performed following the standard protocol during bolus administration of intravenous contrast.  CONTRAST:  8mL OMNIPAQUE IOHEXOL 300 MG/ML  SOLN  COMPARISON:  CT ABD/PELVIS W CM dated 03/27/2013  FINDINGS: CT CHEST FINDINGS  Right IJ Port-A-Cath terminates in the SVC. Mediastinal lymph nodes are not enlarged by CT size criteria. No hilar or axillary adenopathy. Heart is enlarged. No pericardial effusion. Small hiatal hernia.  Image quality is degraded by respiratory motion. 3 mm right upper lobe nodule is unchanged from 03/27/2013. No pleural fluid. Airway is unremarkable.  CT ABDOMEN AND PELVIS FINDINGS   Liver appears slightly decreased in attenuation diffusely. Cholecystectomy. Adrenal glands are unremarkable. There is slightly decreased attenuation of the right renal parenchyma, when compared with the left, with associated moderate to severe right hydronephrosis. Findings are new from 03/27/2013. Hydronephrosis is seen to the level of a retroperitoneal nodal mass anterior to the inferior vena cava, measuring 2.6 x 3.3 cm (previously 2.0 x 2.4 cm when remeasured). Additional retroperitoneal adenopathy with ill-defined soft tissue extending along the course of the abdominal aorta and inferior vena cava. This expands to a nodal mass at the aortic bifurcation, measuring 3.2 x 4.6 cm. Findings are progressive from 03/27/2013.  A 10 mm low-attenuation lesion in the upper pole right kidney unchanged. There is lack contrast excretion from the right kidney on  delayed nephrographic phase imaging. Left kidney, spleen, pancreas, stomach and small bowel are unremarkable. A fair amount of stool is seen in the colon. Hysterectomy. No free fluid. No worrisome lytic or sclerotic lesions. Degenerative changes are seen in the spine.  IMPRESSION: 1. Enlarging retroperitoneal adenopathy results in moderate to severe right hydronephrosis, with evidence of decreased function in the right kidney. These results will be called to the ordering clinician or representative by the Radiologist Assistant, and communication documented in the PACS Dashboard. 2. Liver appears fatty. 3. Fair amount of stool in the colon is indicative of constipation.   Electronically Signed   By: Lorin Picket M.D.   On: 06/05/2013 16:37    ASSESSMENT: [name] 73 y.o. female with a history of Non-Hodgkin's lymphoma of abdomen - Plan: CBC with Differential, Comprehensive metabolic panel (Cmet) - [facility], Lactate dehydrogenase (LDH) - [facility], Ambulatory referral to Anticoagulation Monitoring  Atrial fibrillation - Plan: Ambulatory referral to Anticoagulation  Monitoring   PLAN:   1. Relapsed high grade non-hodgkin's B cell Lymphoma on salvage therapy, progressed on salvage R-GDP  -She completed 5 cycles of R+DGP q 21 days (modified doses based on dehydration, electrolyte abnormalities) and we reviewed the CT Scans of C/A/P (2/23) which was consistent with a further moderate increase in retroperitoneal lymphadenopathy complicated by right hydronephrosis.  --She was evaluated by Dr. Cassell Clement of [facility] for S[facility].  Based on these scans, she will require further chemotherapy.  We discussed treatment with R-ICE, R-CEEP and Rituxan plus revlimid as potential options based on NCCN guidelines.   We discussed the indications for treatment would be to potentially obtain a complete response and to refer back to [facility] for [facility]CT.   Ms. Petkus opted for R-ICE which will require inpatient hospitalization and dose modification.  We will complete 2 cycles and will obtain baseline PET prior to treatment and following the 2 cycles.  The day one of her R-ICE and admission will be determined once her ureteral stent is placed by urology.   2. R. Hydronephrosis, new since 03/2013.  --Patient was referred to urology for possible R ureteral stent.   3. Pancytopenia secondary chemotherapy  -- She is asymptomatic for anemia.   4. Elevated creatinine secondary #2 (in part).  --Creatinine is 1.7 today (Range 1.1 to 1.6 over the past one year). We encouraged aggressive hydration and avoidance of nephrotoxins. We will continue to closely monitor her labs weekly.  5. Hypothyroidism: Levothyroxine per PCP  6. Hypertension: Diltiazem per PCP.  7. Diabetes mellitus, type II: Metformin per PCP.  8. Hyperlipidemia: Pravastatin per PCP.  9.History of atrial fibrillation: Diltiazem, pacemaker. Coumadin being managed per her PCP previously.  Based on continued chemotherapy and frequent oncology visits, we will refer to coumadin clinic.  10. COPD/ Chronic Bronchitis exacerbation.   --Spiriva prescribed by me (with discussion of indications and common side-effects including but not limited to chest pain, dyspepsia, abdominal pain, headache, vomiting, edema) with close follow up recommended with her PCP and possible referral to pulmonary prn. Patient advised to continue albuterol q 6 hours as needed.   11. Follow-up in 1-2 weeks to consent and evaluation for treatment with salvage R-ICE.  This may be done in patinet. We will obtain CBC, CMP.  All questions were answered. The patient knows to call the clinic with any problems, questions or concerns. We can certainly see the patient much sooner if necessary.  I spent 25 minutes counseling the patient face to face. The total time spent  in the appointment was 40 minutes.    Kennadee Walthour, MD 06/13/2013 6:28 AM

## 2013-06-13 NOTE — Telephone Encounter (Signed)
Niece Tamela Oddi called with concerns. She did verify urology consult is March 11. She verified upcoming appts. 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  Her concerns are about the pt home situation. Pt lives at home alone. Uses wheelchair, has bad knees, has poor hearing, has cataracts. She microwaves her food or just out of refrigerator. She has 1 dog and 2 cats and cannot keep house clean. She is supposed to be getting more chemotherapy. Tamela Oddi is concerned about cleanliness, and if any problems arise from chemo.And just pt overall ability to take care of herself.  Also someone to help with her medications. These concerns were passed to [employer] SW. Dr [name] made aware of urology appt date.

## 2013-06-14 ENCOUNTER — Ambulatory Visit
Admission: RE | Admit: 2013-06-14 | Discharge: 2013-06-14 | Disposition: A | Discharge status: Home / Self Care | Payer: Medicare Other | Source: Ambulatory Visit | Attending: Radiation Oncology | Admitting: Radiation Oncology

## 2013-06-14 ENCOUNTER — Encounter: Payer: Self-pay | Admitting: Radiation Oncology

## 2013-06-14 ENCOUNTER — Other Ambulatory Visit: Payer: Self-pay | Admitting: Internal Medicine

## 2013-06-14 VITALS — BP 114/65 | HR 73 | Temp 98.4°F | Ht <= 58 in | Wt 223.3 lb

## 2013-06-14 DIAGNOSIS — C8330 Diffuse large B-cell lymphoma, unspecified site: Secondary | ICD-10-CM

## 2013-06-14 DIAGNOSIS — C8593 Non-Hodgkin lymphoma, unspecified, intra-abdominal lymph nodes: Secondary | ICD-10-CM

## 2013-06-14 DIAGNOSIS — Z7901 Long term (current) use of anticoagulants: Secondary | ICD-10-CM | POA: Insufficient documentation

## 2013-06-14 DIAGNOSIS — C8589 Other specified types of non-Hodgkin lymphoma, extranodal and solid organ sites: Secondary | ICD-10-CM | POA: Insufficient documentation

## 2013-06-14 DIAGNOSIS — Z923 Personal history of irradiation: Secondary | ICD-10-CM | POA: Insufficient documentation

## 2013-06-14 DIAGNOSIS — Z79899 Other long term (current) drug therapy: Secondary | ICD-10-CM | POA: Insufficient documentation

## 2013-06-14 NOTE — Progress Notes (Signed)
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 She was asymptomatic after receiving salvage R-GDP x 5 cycles and underwent a CT scan  on 2-23 of the CAP: this showed abdominal progression with right hydronephrosis from tumor impingement on the ureter.   This appears to be on the margin of her prior RT fields.    Dr [name] has plans for   salvage chemotherapy in the form of R-ICE and then possible SCT with Dr Cassell Clement at [facility].   She has been referred to urology for a ureteral stent.  She denies fevers, abdominal pain, hematuria, urinary issue, night sweats, significant weight loss.       No new SOB. Has a cough.  ALLERGIES:  is allergic to codeine and penicillins.  Meds: Current Outpatient Prescriptions  Medication Sig Dispense Refill  . albuterol (PROVENTIL) (2.5 MG/3ML) 0.083% nebulizer solution Take 3 mLs (2.5 mg total) by nebulization every 6 (six) hours as needed for shortness of breath.  75 mL  6  . allopurinol (ZYLOPRIM) 300 MG tablet Take 300 mg by mouth daily.      . Cholecalciferol (VITAMIN D3) 2000 UNITS TABS Take 2,000 Units by mouth 2 (two) times daily.       . citalopram (CELEXA) 20 MG tablet Take 20 mg by mouth every morning.       . diltiazem (CARDIZEM CD) 180 MG 24 hr capsule Take 180 mg by mouth every morning.       Marland Kitchen guaiFENesin (MUCINEX) 600 MG 12 hr tablet Take 600 mg by mouth 2 (two) times daily.      Marland Kitchen levothyroxine (SYNTHROID, LEVOTHROID) 50 MCG tablet Take 1 tablet (50 mcg total) by  mouth daily before breakfast.  30 tablet  0  . lidocaine-prilocaine (EMLA) cream Apply topically as needed. Apply to port a cath site one hour before needle stick as needed.  30 g  2  . loperamide (IMODIUM) 2 MG capsule Take 2 mg by mouth 4 (four) times daily as needed for diarrhea or loose stools.      . magnesium oxide (MAG-OX) 400 (241.3 MG) MG tablet Take 0.5 tablets (200 mg total) by mouth daily.  30 tablet  0  . metFORMIN (GLUCOPHAGE-XR) 500 MG 24 hr tablet Take 1,000 mg by mouth 2 (two) times daily.      . ondansetron (ZOFRAN) 8 MG tablet Take 1 tablet (8 mg total) by mouth every 12 (twelve) hours as needed for nausea.  20 tablet  3  . potassium chloride (K-DUR) 10 MEQ tablet Take 20 mEq by mouth daily. Take 2 tablets my mouth daily      . pravastatin (PRAVACHOL) 40 MG tablet Take 40 mg by mouth at bedtime.       . prochlorperazine (COMPAZINE) 10 MG tablet Take 10 mg by mouth every 6 (six) hours as needed (nausea).      . tiotropium (SPIRIVA) 18 MCG inhalation capsule Place 18 mcg into inhaler and inhale daily.      Marland Kitchen warfarin (COUMADIN)  5 MG tablet Take 5 mg by mouth daily. Tues and Thurs 2.5mg  and all other days 5 mg       No current facility-administered medications for this encounter.    Physical Findings: The patient is in no acute distress. Patient is alert and oriented.  height is 4\' 9"  (1.448 m) and weight is 223 lb 4.8 oz (101.288 kg). Her temperature is 98.4 F (36.9 C). Her blood pressure is 114/65 and her pulse is 73. Her oxygen saturation is 95%. .    In wheelchair. General: Alert and oriented, in no acute distress. Coughing. HEENT: Head is normocephalic. Extraocular movements are intact. Oropharynx is clear. Neck: Neck is supple, no palpable cervical or supraclavicular lymphadenopathy. Heart: Regular in rate and rhythm with no murmurs, rubs, or gallops. Chest: Clear to auscultation bilaterally, with no rhonchi, wheezes, or rales. Abdomen: Soft, nontender, nondistended,  with no rigidity or guarding. Normoactive BS. Neurologic: Cranial nerves II through XII are grossly intact. No obvious focalities. Speech is fluent  Psychiatric: Judgment and insight are intact. Affect is appropriate.    Lab Findings: Lab Results  Component Value Date   WBC 3.1* 06/12/2013   HGB 10.6* 06/12/2013   HCT 32.9* 06/12/2013   MCV 95.6 06/12/2013   PLT 281 06/12/2013    Radiographic Findings: Ct Chest W Contrast  06/05/2013   CLINICAL DATA:  Non-Hodgkin lymphoma.  EXAM: CT CHEST, ABDOMEN, AND PELVIS WITH CONTRAST  TECHNIQUE: Multidetector CT imaging of the chest, abdomen and pelvis was performed following the standard protocol during bolus administration of intravenous contrast.  CONTRAST:  82mL OMNIPAQUE IOHEXOL 300 MG/ML  SOLN  COMPARISON:  CT ABD/PELVIS W CM dated 03/27/2013  FINDINGS: CT CHEST FINDINGS  Right IJ Port-A-Cath terminates in the SVC. Mediastinal lymph nodes are not enlarged by CT size criteria. No hilar or axillary adenopathy. Heart is enlarged. No pericardial effusion. Small hiatal hernia.  Image quality is degraded by respiratory motion. 3 mm right upper lobe nodule is unchanged from 03/27/2013. No pleural fluid. Airway is unremarkable.  CT ABDOMEN AND PELVIS FINDINGS  Liver appears slightly decreased in attenuation diffusely. Cholecystectomy. Adrenal glands are unremarkable. There is slightly decreased attenuation of the right renal parenchyma, when compared with the left, with associated moderate to severe right hydronephrosis. Findings are new from 03/27/2013. Hydronephrosis is seen to the level of a retroperitoneal nodal mass anterior to the inferior vena cava, measuring 2.6 x 3.3 cm (previously 2.0 x 2.4 cm when remeasured). Additional retroperitoneal adenopathy with ill-defined soft tissue extending along the course of the abdominal aorta and inferior vena cava. This expands to a nodal mass at the aortic bifurcation, measuring 3.2 x 4.6 cm. Findings are progressive from  03/27/2013.  A 10 mm low-attenuation lesion in the upper pole right kidney unchanged. There is lack contrast excretion from the right kidney on delayed nephrographic phase imaging. Left kidney, spleen, pancreas, stomach and small bowel are unremarkable. A fair amount of stool is seen in the colon. Hysterectomy. No free fluid. No worrisome lytic or sclerotic lesions. Degenerative changes are seen in the spine.  IMPRESSION: 1. Enlarging retroperitoneal adenopathy results in moderate to severe right hydronephrosis, with evidence of decreased function in the right kidney. These results will be called to the ordering clinician or representative by the Radiologist Assistant, and communication documented in the PACS Dashboard. 2. Liver appears fatty. 3. Fair amount of stool in the colon is indicative of constipation.   Electronically Signed   By: Lorin Picket M.D.  On: 06/05/2013 16:37   Ct Abdomen Pelvis W Contrast  06/05/2013   CLINICAL DATA:  Non-Hodgkin lymphoma.  EXAM: CT CHEST, ABDOMEN, AND PELVIS WITH CONTRAST  TECHNIQUE: Multidetector CT imaging of the chest, abdomen and pelvis was performed following the standard protocol during bolus administration of intravenous contrast.  CONTRAST:  63mL OMNIPAQUE IOHEXOL 300 MG/ML  SOLN  COMPARISON:  CT ABD/PELVIS W CM dated 03/27/2013  FINDINGS: CT CHEST FINDINGS  Right IJ Port-A-Cath terminates in the SVC. Mediastinal lymph nodes are not enlarged by CT size criteria. No hilar or axillary adenopathy. Heart is enlarged. No pericardial effusion. Small hiatal hernia.  Image quality is degraded by respiratory motion. 3 mm right upper lobe nodule is unchanged from 03/27/2013. No pleural fluid. Airway is unremarkable.  CT ABDOMEN AND PELVIS FINDINGS  Liver appears slightly decreased in attenuation diffusely. Cholecystectomy. Adrenal glands are unremarkable. There is slightly decreased attenuation of the right renal parenchyma, when compared with the left, with associated  moderate to severe right hydronephrosis. Findings are new from 03/27/2013. Hydronephrosis is seen to the level of a retroperitoneal nodal mass anterior to the inferior vena cava, measuring 2.6 x 3.3 cm (previously 2.0 x 2.4 cm when remeasured). Additional retroperitoneal adenopathy with ill-defined soft tissue extending along the course of the abdominal aorta and inferior vena cava. This expands to a nodal mass at the aortic bifurcation, measuring 3.2 x 4.6 cm. Findings are progressive from 03/27/2013.  A 10 mm low-attenuation lesion in the upper pole right kidney unchanged. There is lack contrast excretion from the right kidney on delayed nephrographic phase imaging. Left kidney, spleen, pancreas, stomach and small bowel are unremarkable. A fair amount of stool is seen in the colon. Hysterectomy. No free fluid. No worrisome lytic or sclerotic lesions. Degenerative changes are seen in the spine.  IMPRESSION: 1. Enlarging retroperitoneal adenopathy results in moderate to severe right hydronephrosis, with evidence of decreased function in the right kidney. These results will be called to the ordering clinician or representative by the Radiologist Assistant, and communication documented in the PACS Dashboard. 2. Liver appears fatty. 3. Fair amount of stool in the colon is indicative of constipation.   Electronically Signed   By: Lorin Picket M.D.   On: 06/05/2013 16:37    Impression/Plan: I spoke with the patient, as well as her family member who is present, and Dr. [name] about my impressions. I think it is prudent first prioritize salvage systemic therapy, and ureteral stent if feasible. Then, she'll hopefully be a candidate for stem cell transplant. I do not want to interfere with any protocols at [facility] by delivering radiotherapy; however, if radiotherapy is recommended by the team at [facility], I am happy to help in any way I can. Or, if her disease is refractory to chemotherapy and stem cell  transplant is not an option in the future, I'm happy to see the patient back to reconsider radiotherapy for her. She and Dr. [name] are enthusiastic about this plan. I will see her back on a when necessary basis. I wished her the very best.  I spent 20 minutes face to face with the patient and more than 50% of that time was spent in counseling and/or coordination of care. _____________________________________   Eppie Gibson, MD

## 2013-06-15 ENCOUNTER — Other Ambulatory Visit: Payer: Self-pay | Admitting: Internal Medicine

## 2013-06-15 ENCOUNTER — Other Ambulatory Visit: Payer: Self-pay | Admitting: Physician Assistant

## 2013-06-15 MED ORDER — LEVOTHYROXINE SODIUM 50 MCG PO TABS: 50.0000 ug | ORAL_TABLET | Freq: Every day | ORAL | Status: DC

## 2013-06-15 NOTE — Addendum Note (Signed)
Encounter addended by: Deirdre Evener, RN on: 06/15/2013  1:26 PM<BR>     Documentation filed: Charges VN

## 2013-06-22 ENCOUNTER — Telehealth: Payer: Self-pay

## 2013-06-22 NOTE — Telephone Encounter (Signed)
[name] from Dr Junious Silk of [facility] called. She is asking for direction on coumadin management. Pt is scheduled for R ureteral stent replacement on 3/17. Do they need to hold coumadin? If so for how long before procedure? When to restart?

## 2013-06-23 ENCOUNTER — Encounter (HOSPITAL_COMMUNITY)
Admission: RE | Admit: 2013-06-23 | Discharge: 2013-06-23 | Disposition: A | Discharge status: Home / Self Care | Payer: Medicare Other | Source: Ambulatory Visit | Attending: Urology | Admitting: Urology

## 2013-06-23 ENCOUNTER — Other Ambulatory Visit: Payer: Self-pay | Admitting: Urology

## 2013-06-23 ENCOUNTER — Encounter (HOSPITAL_COMMUNITY): Payer: Self-pay

## 2013-06-23 DIAGNOSIS — Z01812 Encounter for preprocedural laboratory examination: Secondary | ICD-10-CM

## 2013-06-23 HISTORY — DX: Cardiac arrhythmia, unspecified: I49.9

## 2013-06-23 HISTORY — PX: PORTACATH PLACEMENT: SHX2246

## 2013-06-23 HISTORY — DX: Unspecified cataract: H26.9

## 2013-06-23 LAB — PROTIME-INR
INR: 1.36 (ref 0.00–1.49)
Prothrombin Time: 16.4 s — ABNORMAL HIGH (ref 11.6–15.2)

## 2013-06-23 LAB — CBC
HEMATOCRIT: 36.1 % (ref 36.0–46.0)
Hemoglobin: 11.3 g/dL — ABNORMAL LOW (ref 12.0–15.0)
MCH: 30.2 pg (ref 26.0–34.0)
MCHC: 31.3 g/dL (ref 30.0–36.0)
MCV: 96.5 fL (ref 78.0–100.0)
Platelets: 241 10*3/uL (ref 150–400)
RBC: 3.74 MIL/uL — ABNORMAL LOW (ref 3.87–5.11)
RDW: 15.8 % — ABNORMAL HIGH (ref 11.5–15.5)
WBC: 4 10*3/uL (ref 4.0–10.5)

## 2013-06-23 LAB — BASIC METABOLIC PANEL WITH GFR
BUN: 26 mg/dL — ABNORMAL HIGH (ref 6–23)
CO2: 29 meq/L (ref 19–32)
Calcium: 10.9 mg/dL — ABNORMAL HIGH (ref 8.4–10.5)
Chloride: 99 meq/L (ref 96–112)
Creatinine, Ser: 1.76 mg/dL — ABNORMAL HIGH (ref 0.50–1.10)
GFR calc Af Amer: 32 mL/min — ABNORMAL LOW
GFR calc non Af Amer: 28 mL/min — ABNORMAL LOW
Glucose, Bld: 113 mg/dL — ABNORMAL HIGH (ref 70–99)
Potassium: 4.3 meq/L (ref 3.7–5.3)
Sodium: 141 meq/L (ref 137–147)

## 2013-06-23 LAB — APTT: aPTT: 29 s (ref 24–37)

## 2013-06-23 NOTE — Patient Instructions (Addendum)
[place]  06/23/2013   Your procedure is scheduled on: 3-17  -2015  Report to [facility] at     0700   AM / PM.  Call this number if you have problems the morning of surgery: [phone]  Or Presurgical Testing [phone]([name]) For Living Will and/or Health Care Power Attorney Forms: please provide copy for your medical record,may bring AM of surgery(Forms should be already notarized -we do not provide this service).(06-23-13 has been given information-has not filled out yet-no further information needed).      Do not eat food:After Midnight.    Take these medicines the morning of surgery with A SIP OF WATER: Citalopram. Diltiazem. Guaifenesin. Levothyroxine. Bring/ use Spiriva. Use Albuterol Nebulizer AM of. Do not take any Diabetic meds AM of. Coumadin use per MD office instructions.   Do not wear jewelry, make-up or nail polish.  Do not wear lotions, powders, or perfumes. You may wear deodorant.  Do not shave 48 hours(2 days) prior to first CHG shower(legs and under arms).(Shaving face and neck okay.)  Do not bring valuables to the hospital.(Hospital is not responsible for lost valuables).  Contacts, dentures or removable bridgework, body piercing, hair pins may not be worn into surgery.  Leave suitcase in the car. After surgery it may be brought to your room.  For patients admitted to the hospital, checkout time is 11:00 AM the day of discharge.(Restricted visitors-Any Persons displaying flu-like symptoms or illness).    Patients discharged the day of surgery will not be allowed to drive home. Must have responsible person with you x 24 hours once discharged.  Name and phone number of your driver: [name].friend - [phone] cell  Special Instructions: CHG(Chlorhedine 4%-"Hibiclens","Betasept","Aplicare") Shower Use Special Wash: see special instructions.(avoid face and genitals)     Failure to follow these instructions may result in Cancellation  of your surgery.   Patient signature_______________________________________________________

## 2013-06-23 NOTE — Pre-Procedure Instructions (Addendum)
06-23-13 EKG 11'14, CT Chest 05-30-13 Epic. 06-23-13 1615 Note to Dr. Junious Silk office labs viewable in [place].

## 2013-06-23 NOTE — Progress Notes (Signed)
06-23-13 Labs viewable in [place], please note.

## 2013-06-26 ENCOUNTER — Encounter (HOSPITAL_COMMUNITY): Payer: Self-pay | Admitting: Emergency Medicine

## 2013-06-26 ENCOUNTER — Other Ambulatory Visit: Payer: Medicare Other

## 2013-06-26 ENCOUNTER — Telehealth: Payer: Self-pay

## 2013-06-26 ENCOUNTER — Emergency Department (HOSPITAL_COMMUNITY): Payer: Medicare Other

## 2013-06-26 ENCOUNTER — Other Ambulatory Visit: Payer: Self-pay

## 2013-06-26 ENCOUNTER — Ambulatory Visit: Payer: Medicare Other

## 2013-06-26 ENCOUNTER — Inpatient Hospital Stay (HOSPITAL_COMMUNITY)
Admission: EM | Admit: 2013-06-26 | Discharge: 2013-07-03 | DRG: 854 | Disposition: A | Discharge status: Skilled Nursing Facility | Payer: Medicare Other | Attending: Internal Medicine | Admitting: Internal Medicine

## 2013-06-26 DIAGNOSIS — I4891 Unspecified atrial fibrillation: Secondary | ICD-10-CM | POA: Diagnosis present

## 2013-06-26 DIAGNOSIS — D638 Anemia in other chronic diseases classified elsewhere: Secondary | ICD-10-CM

## 2013-06-26 DIAGNOSIS — I2510 Atherosclerotic heart disease of native coronary artery without angina pectoris: Secondary | ICD-10-CM | POA: Diagnosis present

## 2013-06-26 DIAGNOSIS — E1165 Type 2 diabetes mellitus with hyperglycemia: Secondary | ICD-10-CM

## 2013-06-26 DIAGNOSIS — A419 Sepsis, unspecified organism: Principal | ICD-10-CM | POA: Diagnosis present

## 2013-06-26 DIAGNOSIS — R6520 Severe sepsis without septic shock: Secondary | ICD-10-CM

## 2013-06-26 DIAGNOSIS — I10 Essential (primary) hypertension: Secondary | ICD-10-CM | POA: Diagnosis present

## 2013-06-26 DIAGNOSIS — R5383 Other fatigue: Secondary | ICD-10-CM

## 2013-06-26 DIAGNOSIS — Z6841 Body Mass Index (BMI) 40.0 and over, adult: Secondary | ICD-10-CM

## 2013-06-26 DIAGNOSIS — D696 Thrombocytopenia, unspecified: Secondary | ICD-10-CM | POA: Diagnosis present

## 2013-06-26 DIAGNOSIS — R5381 Other malaise: Secondary | ICD-10-CM | POA: Diagnosis present

## 2013-06-26 DIAGNOSIS — R7989 Other specified abnormal findings of blood chemistry: Secondary | ICD-10-CM | POA: Diagnosis present

## 2013-06-26 DIAGNOSIS — E876 Hypokalemia: Secondary | ICD-10-CM | POA: Diagnosis not present

## 2013-06-26 DIAGNOSIS — E1169 Type 2 diabetes mellitus with other specified complication: Secondary | ICD-10-CM

## 2013-06-26 DIAGNOSIS — Z7901 Long term (current) use of anticoagulants: Secondary | ICD-10-CM

## 2013-06-26 DIAGNOSIS — Z823 Family history of stroke: Secondary | ICD-10-CM

## 2013-06-26 DIAGNOSIS — Z950 Presence of cardiac pacemaker: Secondary | ICD-10-CM

## 2013-06-26 DIAGNOSIS — N179 Acute kidney failure, unspecified: Secondary | ICD-10-CM | POA: Diagnosis present

## 2013-06-26 DIAGNOSIS — Z9221 Personal history of antineoplastic chemotherapy: Secondary | ICD-10-CM

## 2013-06-26 DIAGNOSIS — R310 Gross hematuria: Secondary | ICD-10-CM | POA: Diagnosis not present

## 2013-06-26 DIAGNOSIS — D649 Anemia, unspecified: Secondary | ICD-10-CM | POA: Diagnosis present

## 2013-06-26 DIAGNOSIS — N1330 Unspecified hydronephrosis: Secondary | ICD-10-CM | POA: Diagnosis present

## 2013-06-26 DIAGNOSIS — M109 Gout, unspecified: Secondary | ICD-10-CM | POA: Diagnosis present

## 2013-06-26 DIAGNOSIS — N10 Acute tubulo-interstitial nephritis: Secondary | ICD-10-CM | POA: Diagnosis present

## 2013-06-26 DIAGNOSIS — C8589 Other specified types of non-Hodgkin lymphoma, extranodal and solid organ sites: Secondary | ICD-10-CM | POA: Diagnosis present

## 2013-06-26 DIAGNOSIS — N189 Chronic kidney disease, unspecified: Secondary | ICD-10-CM | POA: Diagnosis present

## 2013-06-26 DIAGNOSIS — D61810 Antineoplastic chemotherapy induced pancytopenia: Secondary | ICD-10-CM

## 2013-06-26 DIAGNOSIS — C8330 Diffuse large B-cell lymphoma, unspecified site: Secondary | ICD-10-CM

## 2013-06-26 DIAGNOSIS — Z87891 Personal history of nicotine dependence: Secondary | ICD-10-CM

## 2013-06-26 DIAGNOSIS — T451X5A Adverse effect of antineoplastic and immunosuppressive drugs, initial encounter: Secondary | ICD-10-CM

## 2013-06-26 DIAGNOSIS — J45901 Unspecified asthma with (acute) exacerbation: Secondary | ICD-10-CM

## 2013-06-26 DIAGNOSIS — Z8049 Family history of malignant neoplasm of other genital organs: Secondary | ICD-10-CM

## 2013-06-26 DIAGNOSIS — E039 Hypothyroidism, unspecified: Secondary | ICD-10-CM | POA: Diagnosis present

## 2013-06-26 DIAGNOSIS — R112 Nausea with vomiting, unspecified: Secondary | ICD-10-CM | POA: Diagnosis present

## 2013-06-26 DIAGNOSIS — IMO0001 Reserved for inherently not codable concepts without codable children: Secondary | ICD-10-CM | POA: Diagnosis present

## 2013-06-26 DIAGNOSIS — E785 Hyperlipidemia, unspecified: Secondary | ICD-10-CM | POA: Diagnosis present

## 2013-06-26 DIAGNOSIS — J449 Chronic obstructive pulmonary disease, unspecified: Secondary | ICD-10-CM

## 2013-06-26 DIAGNOSIS — I129 Hypertensive chronic kidney disease with stage 1 through stage 4 chronic kidney disease, or unspecified chronic kidney disease: Secondary | ICD-10-CM | POA: Diagnosis present

## 2013-06-26 DIAGNOSIS — R599 Enlarged lymph nodes, unspecified: Secondary | ICD-10-CM | POA: Diagnosis present

## 2013-06-26 DIAGNOSIS — E669 Obesity, unspecified: Secondary | ICD-10-CM

## 2013-06-26 DIAGNOSIS — N3289 Other specified disorders of bladder: Secondary | ICD-10-CM | POA: Diagnosis present

## 2013-06-26 DIAGNOSIS — D6489 Other specified anemias: Secondary | ICD-10-CM | POA: Diagnosis present

## 2013-06-26 DIAGNOSIS — N390 Urinary tract infection, site not specified: Secondary | ICD-10-CM | POA: Diagnosis present

## 2013-06-26 DIAGNOSIS — K5900 Constipation, unspecified: Secondary | ICD-10-CM | POA: Diagnosis present

## 2013-06-26 DIAGNOSIS — E119 Type 2 diabetes mellitus without complications: Secondary | ICD-10-CM

## 2013-06-26 DIAGNOSIS — R404 Transient alteration of awareness: Secondary | ICD-10-CM | POA: Diagnosis present

## 2013-06-26 DIAGNOSIS — J441 Chronic obstructive pulmonary disease with (acute) exacerbation: Secondary | ICD-10-CM | POA: Diagnosis present

## 2013-06-26 DIAGNOSIS — Z79899 Other long term (current) drug therapy: Secondary | ICD-10-CM

## 2013-06-26 DIAGNOSIS — Z923 Personal history of irradiation: Secondary | ICD-10-CM

## 2013-06-26 DIAGNOSIS — E860 Dehydration: Secondary | ICD-10-CM

## 2013-06-26 LAB — CBC WITH DIFFERENTIAL/PLATELET
Basophils Absolute: 0 10*3/uL (ref 0.0–0.1)
Basophils Relative: 0 % (ref 0–1)
Eosinophils Absolute: 0 10*3/uL (ref 0.0–0.7)
Eosinophils Relative: 0 % (ref 0–5)
HCT: 36.9 % (ref 36.0–46.0)
Hemoglobin: 11.7 g/dL — ABNORMAL LOW (ref 12.0–15.0)
Lymphocytes Relative: 6 % — ABNORMAL LOW (ref 12–46)
Lymphs Abs: 0.7 10*3/uL (ref 0.7–4.0)
MCH: 30.3 pg (ref 26.0–34.0)
MCHC: 31.7 g/dL (ref 30.0–36.0)
MCV: 95.6 fL (ref 78.0–100.0)
Monocytes Absolute: 1.5 10*3/uL — ABNORMAL HIGH (ref 0.1–1.0)
Monocytes Relative: 13 % — ABNORMAL HIGH (ref 3–12)
Neutro Abs: 9.3 10*3/uL — ABNORMAL HIGH (ref 1.7–7.7)
Neutrophils Relative %: 81 % — ABNORMAL HIGH (ref 43–77)
Platelets: 185 10*3/uL (ref 150–400)
RBC: 3.86 MIL/uL — ABNORMAL LOW (ref 3.87–5.11)
RDW: 15.9 % — ABNORMAL HIGH (ref 11.5–15.5)
WBC: 11.4 10*3/uL — ABNORMAL HIGH (ref 4.0–10.5)

## 2013-06-26 LAB — COMPREHENSIVE METABOLIC PANEL
ALT: 11 U/L (ref 0–35)
AST: 17 U/L (ref 0–37)
Albumin: 3.5 g/dL (ref 3.5–5.2)
Alkaline Phosphatase: 54 U/L (ref 39–117)
BUN: 25 mg/dL — ABNORMAL HIGH (ref 6–23)
CO2: 25 mEq/L (ref 19–32)
Calcium: 10.1 mg/dL (ref 8.4–10.5)
Chloride: 95 mEq/L — ABNORMAL LOW (ref 96–112)
Creatinine, Ser: 1.95 mg/dL — ABNORMAL HIGH (ref 0.50–1.10)
GFR calc Af Amer: 28 mL/min — ABNORMAL LOW (ref >90)
GFR calc non Af Amer: 24 mL/min — ABNORMAL LOW (ref >90)
Glucose, Bld: 184 mg/dL — ABNORMAL HIGH (ref 70–99)
Potassium: 4.4 mEq/L (ref 3.7–5.3)
Sodium: 137 mEq/L (ref 137–147)
Total Bilirubin: 0.4 mg/dL (ref 0.3–1.2)
Total Protein: 6.7 g/dL (ref 6.0–8.3)

## 2013-06-26 LAB — PROTIME-INR
INR: 1.14 (ref 0.00–1.49)
Prothrombin Time: 14.4 seconds (ref 11.6–15.2)

## 2013-06-26 LAB — TROPONIN I: Troponin I: <0.3 ng/mL (ref <0.30)

## 2013-06-26 LAB — URINALYSIS, ROUTINE W REFLEX MICROSCOPIC
Bilirubin Urine: NEGATIVE
Glucose, UA: NEGATIVE mg/dL
Ketones, ur: NEGATIVE mg/dL
Nitrite: NEGATIVE
Protein, ur: 100 mg/dL — AB
Specific Gravity, Urine: 1.014 (ref 1.005–1.030)
Urobilinogen, UA: 0.2 mg/dL (ref 0.0–1.0)
pH: 6 (ref 5.0–8.0)

## 2013-06-26 LAB — URINE MICROSCOPIC-ADD ON

## 2013-06-26 LAB — CBG MONITORING, ED: Glucose-Capillary: 206 mg/dL — ABNORMAL HIGH (ref 70–99)

## 2013-06-26 LAB — I-STAT CG4 LACTIC ACID, ED: Lactic Acid, Venous: 1.55 mmol/L (ref 0.5–2.2)

## 2013-06-26 MED ORDER — ALBUTEROL SULFATE (2.5 MG/3ML) 0.083% IN NEBU: 2.5000 mg | INHALATION_SOLUTION | Freq: Four times a day (QID) | RESPIRATORY_TRACT | Status: DC | PRN

## 2013-06-26 MED ORDER — DEXTROSE 5 % IV SOLN
1.0000 g | INTRAVENOUS | Status: DC
  Administered 2013-06-27 – 2013-07-01 (×6): 1 g via INTRAVENOUS
  Filled 2013-06-26 (×8): qty 10

## 2013-06-26 MED ORDER — DOCUSATE SODIUM 100 MG PO CAPS
100.0000 mg | ORAL_CAPSULE | Freq: Every day | ORAL | Status: DC | PRN
  Filled 2013-06-26: qty 1

## 2013-06-26 MED ORDER — DILTIAZEM HCL 100 MG IV SOLR
5.0000 mg/h | INTRAVENOUS | Status: AC
  Administered 2013-06-26: 10 mg/h via INTRAVENOUS
  Administered 2013-06-27: 7 mg via INTRAVENOUS
  Administered 2013-06-28: 5 mg/h via INTRAVENOUS
  Filled 2013-06-26 (×3): qty 100

## 2013-06-26 MED ORDER — PROCHLORPERAZINE MALEATE 10 MG PO TABS: 10.0000 mg | ORAL_TABLET | Freq: Four times a day (QID) | ORAL | Status: DC | PRN

## 2013-06-26 MED ORDER — CITALOPRAM HYDROBROMIDE 20 MG PO TABS
20.0000 mg | ORAL_TABLET | Freq: Every morning | ORAL | Status: DC
  Administered 2013-06-27 – 2013-07-03 (×7): 20 mg via ORAL
  Filled 2013-06-26 (×7): qty 1

## 2013-06-26 MED ORDER — DEXTROSE 5 % IV SOLN
1.0000 g | Freq: Once | INTRAVENOUS | Status: AC
  Administered 2013-06-26: 1 g via INTRAVENOUS
  Filled 2013-06-26: qty 10

## 2013-06-26 MED ORDER — SODIUM CHLORIDE 0.9 % IV BOLUS (SEPSIS)
1000.0000 mL | Freq: Once | INTRAVENOUS | Status: AC
  Administered 2013-06-26: 1000 mL via INTRAVENOUS

## 2013-06-26 MED ORDER — MAGNESIUM OXIDE 400 (241.3 MG) MG PO TABS
200.0000 mg | ORAL_TABLET | Freq: Every day | ORAL | Status: DC
  Administered 2013-06-26: 200 mg via ORAL
  Administered 2013-06-27: 14:00:00 via ORAL
  Administered 2013-06-28 – 2013-07-03 (×6): 200 mg via ORAL
  Filled 2013-06-26 (×8): qty 0.5

## 2013-06-26 MED ORDER — TIOTROPIUM BROMIDE MONOHYDRATE 18 MCG IN CAPS
18.0000 ug | ORAL_CAPSULE | Freq: Every day | RESPIRATORY_TRACT | Status: DC
  Administered 2013-06-28 – 2013-07-03 (×5): 18 ug via RESPIRATORY_TRACT
  Filled 2013-06-26 (×2): qty 5

## 2013-06-26 MED ORDER — LEVOTHYROXINE SODIUM 50 MCG PO TABS
50.0000 ug | ORAL_TABLET | Freq: Every day | ORAL | Status: DC
  Administered 2013-06-27 – 2013-07-03 (×7): 50 ug via ORAL
  Filled 2013-06-26 (×8): qty 1

## 2013-06-26 MED ORDER — SODIUM CHLORIDE 0.9 % IJ SOLN
3.0000 mL | Freq: Two times a day (BID) | INTRAMUSCULAR | Status: DC
  Administered 2013-06-28 – 2013-07-03 (×9): 3 mL via INTRAVENOUS

## 2013-06-26 MED ORDER — SODIUM CHLORIDE 0.9 % IV SOLN
INTRAVENOUS | Status: DC
  Administered 2013-06-26: 22:00:00 via INTRAVENOUS

## 2013-06-26 MED ORDER — ACETAMINOPHEN 650 MG RE SUPP: 650.0000 mg | Freq: Four times a day (QID) | RECTAL | Status: DC | PRN

## 2013-06-26 MED ORDER — ALLOPURINOL 300 MG PO TABS
300.0000 mg | ORAL_TABLET | Freq: Every day | ORAL | Status: DC
  Administered 2013-06-26 – 2013-07-03 (×8): 300 mg via ORAL
  Filled 2013-06-26 (×8): qty 1

## 2013-06-26 MED ORDER — SIMVASTATIN 40 MG PO TABS
40.0000 mg | ORAL_TABLET | Freq: Every day | ORAL | Status: DC
  Filled 2013-06-26: qty 1

## 2013-06-26 MED ORDER — ACETAMINOPHEN 325 MG PO TABS
650.0000 mg | ORAL_TABLET | Freq: Four times a day (QID) | ORAL | Status: DC | PRN
  Administered 2013-06-27 – 2013-07-01 (×3): 650 mg via ORAL
  Filled 2013-06-26 (×3): qty 2

## 2013-06-26 MED ORDER — ONDANSETRON HCL 4 MG PO TABS: 8.0000 mg | ORAL_TABLET | Freq: Two times a day (BID) | ORAL | Status: DC | PRN

## 2013-06-26 MED ORDER — HEPARIN SODIUM (PORCINE) 5000 UNIT/ML IJ SOLN
5000.0000 [IU] | Freq: Three times a day (TID) | INTRAMUSCULAR | Status: DC
  Administered 2013-06-27: 5000 [IU] via SUBCUTANEOUS
  Filled 2013-06-26 (×4): qty 1

## 2013-06-26 MED ORDER — INSULIN ASPART 100 UNIT/ML ~~LOC~~ SOLN
0.0000 [IU] | SUBCUTANEOUS | Status: DC
  Administered 2013-06-26: 3 [IU] via SUBCUTANEOUS
  Administered 2013-06-27 (×2): 1 [IU] via SUBCUTANEOUS
  Administered 2013-06-27: 3 [IU] via SUBCUTANEOUS
  Administered 2013-06-27 – 2013-06-28 (×2): 1 [IU] via SUBCUTANEOUS

## 2013-06-26 MED ORDER — ONDANSETRON HCL 4 MG/2ML IJ SOLN
4.0000 mg | Freq: Once | INTRAMUSCULAR | Status: AC
  Administered 2013-06-26: 4 mg via INTRAVENOUS
  Filled 2013-06-26: qty 2

## 2013-06-26 MED ORDER — GUAIFENESIN ER 600 MG PO TB12
600.0000 mg | ORAL_TABLET | Freq: Two times a day (BID) | ORAL | Status: DC
  Administered 2013-06-26 – 2013-07-03 (×14): 600 mg via ORAL
  Filled 2013-06-26 (×15): qty 1

## 2013-06-26 NOTE — ED Provider Notes (Signed)
CSN: [id number]     Arrival date & time 06/26/13  75 History   First MD Initiated Contact with Patient 06/26/13 1547     Chief Complaint  Patient presents with  . Nausea  . Emesis     (Consider location/radiation/quality/duration/timing/severity/associated sxs/prior Treatment) HPI: [name] is a 73 year old female with past medical history of COPD, atrial fibrillation s/p pacemaker placement, HTN, DM, and lymphoma who presents to the ED with chief complaint of nausea and vomiting.  She reports that she had sudden onset of chills and vomiting last night.  She is not able to quantify how many times she has vomited since but reports that she has been able to keep some water down.  She denies diarrhea, fever, and abdominal pain.  She states that she became concerned because she was scheduled to undergo kidney stent placement tomorrow morning (for which she has been off of her coumadin for an unknown length of time).  She called to cancel the procedure but is unable to relate how EMS ended up bringing her to the emergency department.  She is having trouble communicating information regarding past medical history and recent events, which family at the bedside (son and niece) say is unusual for her.  By their report, she lives home alone and is independent at baseline.  She is able to convey that she has had a "cold" for an unspecified period of time, and she has productive cough during interview.  She does not wear oxygen at home, however EMS found her oxygen saturation in the mid to high 80s and put her on 3L of oxygen en route.  The also administered Zofran.  Past Medical History  Diagnosis Date  . Sick sinus syndrome   . Hematoma     At the site of the pacemaker insertion.  . Hypotension   . Dyslipidemia   . History of atrial fibrillation   . Arthritis     Osteoarthritis  . Port-a-cath in place 12/08/10  . Status post chemotherapy completed 03/2011    R - CHOP q 3 weeks x 6  . S/P  radiation therapy 05/28/2011 - 06/26/2011    Right Abdomen and Right Pelvis/3060 cGy in 17 Fractions  . Hypertension   . ASHD (arteriosclerotic heart disease)   . Gout   . COPD (chronic obstructive pulmonary disease)   . Asthma   . Hyperlipidemia   . Diabetes mellitus   . Hypothyroidism   . Diffuse large B cell lymphoma     Dr. Chisom,oncology  . Cancer 10/2010    large B cell lymphoma  . 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 Pacemaker     left chest  . Dysrhythmia     Hx. A. Fib, sick sinus syndrome- Pacemaker inserted.  . Cataracts, bilateral    Past Surgical History  Procedure Laterality Date  . Pacemaker insertion      Lead revision completed August 04, 2006- left chest G. Lovena Le  . Tubal ligation      Bilateral  . Insert / replace / remove pacemaker    . Abdominal hysterectomy    . Biopsy stomach  11/03/10    Soft Tissue Mass, Biopsy, Right Lower Quadrant Mesenteric Mass- High Grade Non-Hodgkins B Cell Lymphoma  with Flow Cytometry  . Bone biopsy  11/24/10    Bone Marrow, Aspirate Biospy, and clot, Left - No Involvement of Non-Hodgkin's Lymphoma Identified  . Portacath placement Right 06-23-13  . Cholecystectomy  laparoscopic  . Bladder suspension     Family History  Problem Relation Age of Onset  . Stroke Mother   . Stroke Father   . Aneurysm Son     Brain  . Cancer Sister      1 sister had vaginal cancer  . Cancer Brother     Prostate   History  Substance Use Topics  . Smoking status: Former Smoker -- 1.50 packs/day for 30 years    Types: Cigarettes    Quit date: 03/14/2011  . Smokeless tobacco: Never Used  . Alcohol Use: No   OB History   Grav Para Term Preterm Abortions TAB SAB Ect Mult Living                 Review of Systems  All other systems negative except as documented in the HPI. All pertinent positives and negatives as reviewed in the HPI.   Allergies  Codeine and Penicillins  Home  Medications   Current Outpatient Rx  Name  Route  Sig  Dispense  Refill  . albuterol (PROVENTIL) (2.5 MG/3ML) 0.083% nebulizer solution   Nebulization   Take 3 mLs (2.5 mg total) by nebulization every 6 (six) hours as needed for shortness of breath.   75 mL   6   . allopurinol (ZYLOPRIM) 300 MG tablet   Oral   Take 300 mg by mouth daily.         . Cholecalciferol (VITAMIN D3) 2000 UNITS TABS   Oral   Take 2,000 Units by mouth 2 (two) times daily.          . citalopram (CELEXA) 20 MG tablet   Oral   Take 20 mg by mouth every morning.          . diltiazem (CARDIZEM CD) 180 MG 24 hr capsule   Oral   Take 180 mg by mouth every morning.          Marland Kitchen guaiFENesin (MUCINEX) 600 MG 12 hr tablet   Oral   Take 600 mg by mouth 2 (two) times daily.         Marland Kitchen levothyroxine (SYNTHROID, LEVOTHROID) 50 MCG tablet   Oral   Take 1 tablet (50 mcg total) by mouth daily before breakfast.   30 tablet   0   . lidocaine-prilocaine (EMLA) cream   Topical   Apply topically as needed. Apply to port a cath site one hour before needle stick as needed.   30 g   2   . loperamide (IMODIUM) 2 MG capsule   Oral   Take 2 mg by mouth 4 (four) times daily as needed for diarrhea or loose stools.         . magnesium oxide (MAG-OX) 400 (241.3 MG) MG tablet   Oral   Take 0.5 tablets (200 mg total) by mouth daily.   30 tablet   0   . metFORMIN (GLUCOPHAGE-XR) 500 MG 24 hr tablet   Oral   Take 1,000 mg by mouth 2 (two) times daily.         . ondansetron (ZOFRAN) 8 MG tablet   Oral   Take 1 tablet (8 mg total) by mouth every 12 (twelve) hours as needed for nausea.   20 tablet   3   . potassium chloride (K-DUR) 10 MEQ tablet   Oral   Take 20 mEq by mouth daily. Take 2 tablets my mouth daily         . potassium chloride (  K-DUR) 10 MEQ tablet      TAKE 2 TABLETS MY MOUTH DAILY   60 tablet   0     CYCLE FILL MEDICATION. Authorization is required f ...   . pravastatin  (PRAVACHOL) 40 MG tablet   Oral   Take 40 mg by mouth at bedtime.          . prochlorperazine (COMPAZINE) 10 MG tablet   Oral   Take 10 mg by mouth every 6 (six) hours as needed (nausea).         . tiotropium (SPIRIVA) 18 MCG inhalation capsule   Inhalation   Place 18 mcg into inhaler and inhale daily.         Marland Kitchen warfarin (COUMADIN) 5 MG tablet   Oral   Take 5 mg by mouth daily. Tues and Thurs 2.5mg  and all other days 5 mg          BP 90/64  Pulse 102  Temp(Src) 97.5 F (36.4 C)  Resp 18  SpO2 92% Physical Exam  Nursing note and vitals reviewed. Constitutional: She is oriented to person, place, and time. She appears well-developed and well-nourished. No distress.  Patient appears unwell; face is ashen.  HENT:  Head: Normocephalic and atraumatic.  Eyes: Pupils are equal, round, and reactive to light.  Neck: Normal range of motion. Neck supple.  Cardiovascular: Regular rhythm, normal heart sounds and intact distal pulses.  Exam reveals no gallop and no friction rub.   No murmur heard. Irregular rhythm, tachycardic  Pulmonary/Chest: She has wheezes. She has no rales.  Diffuse expiratory wheezing and diminished breath sounds  Abdominal: Soft. Bowel sounds are normal. She exhibits no distension. There is no tenderness. There is no rebound.  Neurological: She is alert and oriented to person, place, and time.  Skin: Skin is warm.  Psychiatric:  Patient with blocking during interview; having trouble recalling past medical history, medications, and recent events leading up to her visit to the ED    ED Course  Procedures (including critical care time) Labs Review Labs Reviewed  URINE CULTURE  CBC WITH DIFFERENTIAL  COMPREHENSIVE METABOLIC PANEL  URINALYSIS, ROUTINE W REFLEX MICROSCOPIC  TROPONIN I  I-STAT CG4 LACTIC ACID, ED   patient will be admitted to the hospital for further evaluation and care.  Told of the plan and all questions were answered.  Spoke with the,  [facility], and the urologist on call about the patient.  Patient was severely dehydrated with some suspected early, possible sepsis.  IV antibiotics were initiated.   Brent General, PA-C 06/28/13 [phone]

## 2013-06-26 NOTE — H&P (Signed)
[facility] History and Physical  SAFIYYA STOKES HEN:[id number] DOB: 11-09-1940 DOA: 06/26/2013  Referring physician: EDP PCP: [name], MD   Chief Complaint: N/V   HPI: [name] is a 73 y.o. female who presents to the ED with chills, fever, nausea and vomiting.  Symptoms onset last night.  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  Because of this, she was actually scheduled to have ureteral stent placement procedure performed here at [facility] hospital tomorrow.  Because she was concerned due to feeling ill, she had actually called to cancel the procedure.  Shes not really able to relate how EMS (quite fortunately) got called and ended up bringing her to the ED.  In the ED she is found to have a UTI with Sepsis, mild delirium.  Although she was initially borderline hypotensive, her pressure has thankfully responded to IVF.  Review of Systems: Systems reviewed.  As above, otherwise negative  Past Medical History  Diagnosis Date  . Sick sinus syndrome   . Hematoma     At the site of the pacemaker insertion.  . Hypotension   . Dyslipidemia   . History of atrial fibrillation   . Arthritis     Osteoarthritis  . Port-a-cath in place 12/08/10  . Status post chemotherapy completed 03/2011    R - CHOP q 3 weeks x 6  . S/P radiation therapy 05/28/2011 - 06/26/2011    Right Abdomen and Right Pelvis/3060 cGy in 17 Fractions  . Hypertension   . ASHD (arteriosclerotic heart disease)   . Gout   . COPD (chronic obstructive pulmonary disease)   . Asthma   . Hyperlipidemia   . Diabetes mellitus   . Hypothyroidism   . Diffuse large B cell lymphoma     Dr. Chisom,oncology  . Cancer 10/2010    large B cell lymphoma  . 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 Pacemaker     left chest  . Dysrhythmia     Hx. A. Fib, sick sinus syndrome- Pacemaker inserted.  . Cataracts, bilateral     Past Surgical History  Procedure Laterality Date  . Pacemaker insertion      Lead revision completed August 04, 2006- left chest G. Lovena Le  . Tubal ligation      Bilateral  . Insert / replace / remove pacemaker    . Abdominal hysterectomy    . Biopsy stomach  11/03/10    Soft Tissue Mass, Biopsy, Right Lower Quadrant Mesenteric Mass- High Grade Non-Hodgkins B Cell Lymphoma  with Flow Cytometry  . Bone biopsy  11/24/10    Bone Marrow, Aspirate Biospy, and clot, Left - No Involvement of Non-Hodgkin's Lymphoma Identified  . Portacath placement Right 06-23-13  . Cholecystectomy      laparoscopic  . Bladder suspension     Social History:  reports that she quit smoking about 2 years ago. Her smoking use included Cigarettes. She has a 45 pack-year smoking history. She has never used smokeless tobacco. She reports that she does not drink alcohol or use illicit drugs.  Allergies  Allergen Reactions  . Codeine Nausea Only  . Penicillins     Unknown    Family History  Problem Relation Age of Onset  . Stroke Mother   . Stroke Father   . Aneurysm Son     Brain  . Cancer Sister      1  sister had vaginal cancer  . Cancer Brother     Prostate     Prior to Admission medications   Medication Sig Start Date End Date Taking? Authorizing Provider  albuterol (PROVENTIL) (2.5 MG/3ML) 0.083% nebulizer solution Take 3 mLs (2.5 mg total) by nebulization every 6 (six) hours as needed for shortness of breath. 04/14/13  Yes Melissa R Smith, PA-C  allopurinol (ZYLOPRIM) 300 MG tablet Take 300 mg by mouth daily.   Yes Historical Provider, MD  Cholecalciferol (VITAMIN D3) 2000 UNITS TABS Take 4,000 Units by mouth 2 (two) times daily.    Yes Historical Provider, MD  citalopram (CELEXA) 20 MG tablet Take 20 mg by mouth every morning.    Yes Historical Provider, MD  diltiazem (CARDIZEM CD) 180 MG 24 hr capsule Take 180 mg by mouth every morning.    Yes Historical Provider, MD  docusate sodium (COLACE) 100 MG  capsule Take 100 mg by mouth daily as needed for mild constipation.   Yes Historical Provider, MD  guaiFENesin (MUCINEX) 600 MG 12 hr tablet Take 600 mg by mouth 2 (two) times daily.   Yes Historical Provider, MD  levothyroxine (SYNTHROID, LEVOTHROID) 100 MCG tablet Take 50 mcg by mouth daily before breakfast.   Yes Historical Provider, MD  magnesium oxide (MAG-OX) 400 (241.3 MG) MG tablet Take 0.5 tablets (200 mg total) by mouth daily. 02/21/13  Yes Allie Bossier, MD  metFORMIN (GLUCOPHAGE-XR) 500 MG 24 hr tablet Take 1,000 mg by mouth 2 (two) times daily.   Yes Historical Provider, MD  ondansetron (ZOFRAN) 8 MG tablet Take 8 mg by mouth 2 (two) times daily as needed for nausea or vomiting.   Yes Historical Provider, MD  potassium chloride (K-DUR) 10 MEQ tablet Take 20 mEq by mouth daily. Take 2 tablets my mouth daily 01/30/13  Yes Concha Norway, MD  pravastatin (PRAVACHOL) 40 MG tablet Take 40 mg by mouth at bedtime.    Yes Historical Provider, MD  prochlorperazine (COMPAZINE) 10 MG tablet Take 10 mg by mouth every 6 (six) hours as needed for nausea or vomiting.   Yes Historical Provider, MD  tiotropium (SPIRIVA) 18 MCG inhalation capsule Place 18 mcg into inhaler and inhale daily.   Yes Historical Provider, MD  warfarin (COUMADIN) 5 MG tablet Take 5 mg by mouth daily. 2.5 mg tablet on Tuesday and Thursday and all others days is 5 mg   Yes Historical Provider, MD  lidocaine-prilocaine (EMLA) cream Apply topically as needed. Apply to port a cath site one hour before needle stick as needed. 06/12/13   Concha Norway, MD   Physical Exam: Filed Vitals:   06/26/13 2121  BP: 109/75  Pulse: 118  Temp: 99.6 F (37.6 C)  Resp: 20    BP 109/75  Pulse 118  Temp(Src) 99.6 F (37.6 C) (Oral)  Resp 20  SpO2 100%  General Appearance:    Alert, oriented, no distress, appears stated age  Head:    Normocephalic, atraumatic  Eyes:    PERRL, EOMI, sclera non-icteric        Nose:   Nares without drainage or  epistaxis. Mucosa, turbinates normal  Throat:   Moist mucous membranes. Oropharynx without erythema or exudate.  Neck:   Supple. No carotid bruits.  No thyromegaly.  No lymphadenopathy.   Back:     No CVA tenderness, no spinal tenderness  Lungs:     Bilateral wheezes.  Chest wall:    No tenderness to palpitation  Heart:  Regular rate and rhythm without murmurs, gallops, rubs  Abdomen:     Soft, non-tender, nondistended, normal bowel sounds, no organomegaly  Genitalia:    deferred  Rectal:    deferred  Extremities:   No clubbing, cyanosis or edema.  Pulses:   2+ and symmetric all extremities  Skin:   Skin color, texture, turgor normal, no rashes or lesions  Lymph nodes:   Cervical, supraclavicular, and axillary nodes normal  Neurologic:   CNII-XII intact. Normal strength, sensation and reflexes      throughout    Labs on Admission:  Basic Metabolic Panel:  Recent Labs Lab 06/23/13 1105 06/26/13 1635  NA 141 137  K 4.3 4.4  CL 99 95*  CO2 29 25  GLUCOSE 113* 184*  BUN 26* 25*  CREATININE 1.76* 1.95*  CALCIUM 10.9* 10.1   Liver Function Tests:  Recent Labs Lab 06/26/13 1635  AST 17  ALT 11  ALKPHOS 54  BILITOT 0.4  PROT 6.7  ALBUMIN 3.5   No results found for this basename: LIPASE, AMYLASE,  in the last 168 hours No results found for this basename: AMMONIA,  in the last 168 hours CBC:  Recent Labs Lab 06/23/13 1025 06/26/13 1635  WBC 4.0 11.4*  NEUTROABS  --  9.3*  HGB 11.3* 11.7*  HCT 36.1 36.9  MCV 96.5 95.6  PLT 241 185   Cardiac Enzymes:  Recent Labs Lab 06/26/13 1635  TROPONINI <0.30    BNP (last 3 results) No results found for this basename: PROBNP,  in the last 8760 hours CBG: No results found for this basename: GLUCAP,  in the last 168 hours  Radiological Exams on Admission: Dg Chest 2 View  06/26/2013   CLINICAL DATA:  Cough, shortness of breath, weakness, history COPD, hypertension, asthma, diabetes, non-Hodgkin's lymphoma  EXAM:  CHEST  2 VIEW  COMPARISON:  02/12/2013  FINDINGS: Right subclavian power port with tip projecting over SVC.  Left subclavian transvenous pacemaker leads project at right atrium and right ventricle.  Enlargement of cardiac silhouette with elongation of thoracic aorta.  Mediastinal contours and pulmonary vascularity otherwise normal.  Lungs clear.  No pleural effusion or pneumothorax.  Bones demineralized.  IMPRESSION: Enlargement of cardiac silhouette post pacemaker.  No acute abnormalities.   Electronically Signed   By: Lavonia Dana M.D.   On: 06/26/2013 17:29    EKG: Independently reviewed.  Assessment/Plan Principal Problem:   Severe sepsis Active Problems:   Essential hypertension, benign   Diffuse large B cell lymphoma   Diabetes mellitus type 2 in obese   Atrial fibrillation with RVR   Hydronephrosis, right   UTI (lower urinary tract infection)   Sepsis secondary to UTI   1. Severe sepsis secondary to UTI - cultures pending, pressures responded to IVF, this is complicated by known R sided hydronephrosis for which urology had already planned on putting a stent in tomorrow.  Given the now acute UTI, and inability to determine how much if any of the infection is above the area of blockage, patient may now require this procedure urgently tonight.  EDP is contacting urology at this time.  Will admit to SDU on rocephin, keeping patient NPO and off of anticoagulation in anticipation of possible intervention tonight if urology feels this is indeed indicated. 2. A.Fib RVR - holding home cardizem and converting to cardizem gtt due to low BPs earlier this evening.  The patient's INR is normal as she has stopped her coumadin already in preparation for tomorrows  stent placement, doing DVT PPx heparin for now, resume full dose heparin to coumadin once stent in place. 3. DM2 - holding metformin and putting patient on sensitive scale SSI q4h while we keep her NPO for possible OR stent placement. 4. DLBCL -  patients oncologist actually saw and evaluated patient in ED (oncology aware of admission), right now chemo has been on hold for a while pending stent placement per what the EDP tells me.  Urology is being called by EDP right now for consultation and possible urgent stent placement if appropriate this evening.  Code Status: Full Code Family Communication: No family in room Disposition Plan: Admit to inpatient   Time spent: 70 min  Ethanael Veith M. [facility] Pager [phone]  If 7AM-7PM, please contact the day team taking care of the patient [email or web address] Password [facility] 06/26/2013, 9:37 PM

## 2013-06-26 NOTE — ED Notes (Signed)
Bed: WA19 Expected date:  Expected time:  Means of arrival:  Comments: ems 

## 2013-06-26 NOTE — Telephone Encounter (Signed)
Returned pt call from this AM. This AM she stated she threw up in the night. At this time pt just keeps repeating " I just don't feel good."  She has to think to answer how many times she threw up and cannot remember. She threw up as she was on the phone. She was coughing and SOB on the phone. She was home alone. She states she had not taken any of her medications today. States her back hurts. She cannot tell me if anywhere else hurts. She was saying she was having surgery in the AM. She could not tell me what surgery or which body part was being operated on. Pt stated she did not need to go to ER. I had another nurse call family member while I had pt on the phone. The family will go by the pt's house. 30 s/w Dorothy as she was entering pts house. Dorothy siad "Oh she's bad" I heard vomiting in background. I had spoken with Dr [name] and told [name] to call 911.

## 2013-06-26 NOTE — ED Notes (Signed)
Patient states she has had nausea and vomiting for two days. She denies diarrhea and has not had solid foods, fluids only.

## 2013-06-27 ENCOUNTER — Inpatient Hospital Stay (HOSPITAL_COMMUNITY): Payer: Medicare Other

## 2013-06-27 ENCOUNTER — Inpatient Hospital Stay (HOSPITAL_COMMUNITY): Payer: Medicare Other | Admitting: Anesthesiology

## 2013-06-27 ENCOUNTER — Ambulatory Visit (HOSPITAL_COMMUNITY): Admission: RE | Admit: 2013-06-27 | Payer: Medicare Other | Source: Ambulatory Visit | Admitting: Urology

## 2013-06-27 ENCOUNTER — Encounter (HOSPITAL_COMMUNITY)
Admission: EM | Disposition: A | Discharge status: Skilled Nursing Facility | Payer: Self-pay | Source: Home / Self Care | Attending: Internal Medicine

## 2013-06-27 ENCOUNTER — Encounter (HOSPITAL_COMMUNITY): Payer: Medicare Other | Admitting: Anesthesiology

## 2013-06-27 DIAGNOSIS — N1330 Unspecified hydronephrosis: Secondary | ICD-10-CM

## 2013-06-27 LAB — CBC
HCT: 30.4 % — ABNORMAL LOW (ref 36.0–46.0)
Hemoglobin: 10 g/dL — ABNORMAL LOW (ref 12.0–15.0)
MCH: 31.1 pg (ref 26.0–34.0)
MCHC: 32.9 g/dL (ref 30.0–36.0)
MCV: 94.4 fL (ref 78.0–100.0)
Platelets: 137 10*3/uL — ABNORMAL LOW (ref 150–400)
RBC: 3.22 MIL/uL — ABNORMAL LOW (ref 3.87–5.11)
RDW: 15.8 % — AB (ref 11.5–15.5)
WBC: 8.6 10*3/uL (ref 4.0–10.5)

## 2013-06-27 LAB — BASIC METABOLIC PANEL
BUN: 23 mg/dL (ref 6–23)
CALCIUM: 8.8 mg/dL (ref 8.4–10.5)
CO2: 23 mEq/L (ref 19–32)
Chloride: 101 mEq/L (ref 96–112)
Creatinine, Ser: 1.87 mg/dL — ABNORMAL HIGH (ref 0.50–1.10)
GFR, EST AFRICAN AMERICAN: 30 mL/min — AB (ref >90)
GFR, EST NON AFRICAN AMERICAN: 26 mL/min — AB (ref >90)
GLUCOSE: 147 mg/dL — AB (ref 70–99)
Potassium: 3.9 mEq/L (ref 3.7–5.3)
SODIUM: 138 meq/L (ref 137–147)

## 2013-06-27 LAB — GLUCOSE, CAPILLARY
GLUCOSE-CAPILLARY: 121 mg/dL — AB (ref 70–99)
Glucose-Capillary: 129 mg/dL — ABNORMAL HIGH (ref 70–99)
Glucose-Capillary: 209 mg/dL — ABNORMAL HIGH (ref 70–99)
Glucose-Capillary: 136 mg/dL — ABNORMAL HIGH (ref 70–99)
Glucose-Capillary: 149 mg/dL — ABNORMAL HIGH (ref 70–99)
Glucose-Capillary: 132 mg/dL — ABNORMAL HIGH (ref 70–99)
Glucose-Capillary: 113 mg/dL — ABNORMAL HIGH (ref 70–99)

## 2013-06-27 LAB — MRSA PCR SCREENING: MRSA by PCR: NEGATIVE

## 2013-06-27 SURGERY — CYSTOURETEROSCOPY, WITH RETROGRADE PYELOGRAM AND STENT INSERTION
Anesthesia: General

## 2013-06-27 MED ORDER — LACTATED RINGERS IV SOLN: INTRAVENOUS | Status: DC

## 2013-06-27 MED ORDER — PROMETHAZINE HCL 25 MG/ML IJ SOLN: 6.2500 mg | INTRAMUSCULAR | Status: DC | PRN

## 2013-06-27 MED ORDER — HEPARIN (PORCINE) IN NACL 100-0.45 UNIT/ML-% IJ SOLN
1000.0000 [IU]/h | INTRAMUSCULAR | Status: DC
  Administered 2013-06-27: 1000 [IU]/h via INTRAVENOUS
  Filled 2013-06-27: qty 250

## 2013-06-27 MED ORDER — PROPOFOL 10 MG/ML IV BOLUS
INTRAVENOUS | Status: AC
  Filled 2013-06-27: qty 20

## 2013-06-27 MED ORDER — SODIUM CHLORIDE 0.9 % IV SOLN: INTRAVENOUS | Status: AC

## 2013-06-27 MED ORDER — MIDAZOLAM HCL 2 MG/2ML IJ SOLN
INTRAMUSCULAR | Status: AC
  Filled 2013-06-27: qty 6

## 2013-06-27 MED ORDER — MIDAZOLAM HCL 2 MG/2ML IJ SOLN
INTRAMUSCULAR | Status: AC | PRN
  Administered 2013-06-27 (×2): 0.5 mg via INTRAVENOUS

## 2013-06-27 MED ORDER — LACTATED RINGERS IV SOLN
INTRAVENOUS | Status: DC | PRN
  Administered 2013-06-27: 09:00:00 via INTRAVENOUS

## 2013-06-27 MED ORDER — FENTANYL CITRATE 0.05 MG/ML IJ SOLN
INTRAMUSCULAR | Status: AC
  Filled 2013-06-27: qty 6

## 2013-06-27 MED ORDER — MEPERIDINE HCL 25 MG/ML IJ SOLN: 6.2500 mg | INTRAMUSCULAR | Status: DC | PRN

## 2013-06-27 MED ORDER — FENTANYL CITRATE 0.05 MG/ML IJ SOLN: 25.0000 ug | INTRAMUSCULAR | Status: DC | PRN

## 2013-06-27 MED ORDER — ONDANSETRON HCL 4 MG/2ML IJ SOLN: 4.0000 mg | Freq: Four times a day (QID) | INTRAMUSCULAR | Status: DC | PRN

## 2013-06-27 MED ORDER — FENTANYL CITRATE 0.05 MG/ML IJ SOLN
INTRAMUSCULAR | Status: AC | PRN
  Administered 2013-06-27: 25 ug via INTRAVENOUS

## 2013-06-27 MED ORDER — LIDOCAINE-EPINEPHRINE 2 %-1:200000 IJ SOLN
INTRAMUSCULAR | Status: AC
Start: 2013-06-27 — End: 2013-06-27
  Filled 2013-06-27: qty 20

## 2013-06-27 MED ORDER — IOHEXOL 300 MG/ML  SOLN
20.0000 mL | Freq: Once | INTRAMUSCULAR | Status: AC | PRN
  Administered 2013-06-27: 20 mL via INTRAVENOUS

## 2013-06-27 MED ORDER — FENTANYL CITRATE 0.05 MG/ML IJ SOLN
INTRAMUSCULAR | Status: AC
  Filled 2013-06-27: qty 2

## 2013-06-27 MED ORDER — ATORVASTATIN CALCIUM 20 MG PO TABS
20.0000 mg | ORAL_TABLET | Freq: Every day | ORAL | Status: DC
  Administered 2013-06-27 – 2013-07-03 (×7): 20 mg via ORAL
  Filled 2013-06-27 (×7): qty 1

## 2013-06-27 NOTE — Progress Notes (Signed)
INITIAL NUTRITION ASSESSMENT  DOCUMENTATION CODES Per approved criteria  -Morbid Obesity   INTERVENTION: Monitor diet advancement  NUTRITION DIAGNOSIS: Inadequate oral intake related to inability to eat as evidenced by npo status.   Goal: Tolerate diet with intake of >90% of needs once diet advanced.  Monitor:  Diet advancement, intake, labs, weight trend  Reason for Assessment: mst  73 y.o. female  Admitting Dx: Severe sepsis  ASSESSMENT: Patient with possible sepsis, UTI/ARF.  Hx includes NHL.  Ureter compression causing right hydronephrosis.  Patient poor candidate for anesthesia so will have a right percutaneous nephrostomy with IR.  Patient is a poor historian.  Daughter present.  Daughter provides meals for patient but they often go untouched as patient does not like eating healthy.  Friends bring her fast food at times.  Patient gets around with a scooter and has not walked for a long time per family.   Nutrition Focused Physical Exam:  Subcutaneous Fat:  Orbital Region: wnl Upper Arm Region: wnl Thoracic and Lumbar Region: n/a  Muscle:  Temple Region: moderate/severe Clavicle Bone Region: wnl Clavicle and Acromion Bone Region: wnl Scapular Bone Region: n/a Dorsal Hand: mild Patellar Region: wnl Anterior Thigh Region: mild Posterior Calf Region: mild  Edema: possitive   Height: Ht Readings from Last 1 Encounters:  06/26/13 4\' 8"  (1.422 m)    Weight: Wt Readings from Last 1 Encounters:  06/26/13 228 lb 6.3 oz (103.6 kg)    Ideal Body Weight: 90 lbs  % Ideal Body Weight: 253  Wt Readings from Last 10 Encounters:  06/26/13 228 lb 6.3 oz (103.6 kg)  06/26/13 228 lb 6.3 oz (103.6 kg)  06/23/13 223 lb (101.152 kg)  06/14/13 223 lb 4.8 oz (101.288 kg)  06/12/13 220 lb 6.4 oz (99.973 kg)  05/11/13 216 lb (97.977 kg)  04/20/13 218 lb 12.8 oz (99.247 kg)  04/19/13 218 lb (98.884 kg)  03/30/13 223 lb (101.152 kg)  03/21/13 221 lb 11.2 oz (100.562  kg)    Usual Body Weight: 220 lbs  % Usual Body Weight: 104  BMI:  Body mass index is 51.23 kg/(m^2).  Estimated Nutritional Needs: Kcal: 1500-1600 Protein: 80-90 gms Fluid: 2.5L  Skin: wnl  Diet Order: NPO  EDUCATION NEEDS: -Education needs addressed   Intake/Output Summary (Last 24 hours) at 06/27/13 1217 Last data filed at 06/27/13 0600  Gross per 24 hour  Intake 1075.83 ml  Output      0 ml  Net 1075.83 ml      Labs:   Recent Labs Lab 06/23/13 1105 06/26/13 1635  NA 141 137  K 4.3 4.4  CL 99 95*  CO2 29 25  BUN 26* 25*  CREATININE 1.76* 1.95*  CALCIUM 10.9* 10.1  GLUCOSE 113* 184*    CBG (last 3)   Recent Labs  06/26/13 2355 06/27/13 0353 06/27/13 0740  GLUCAP 129* 209* 136*    Scheduled Meds: . allopurinol  300 mg Oral Daily  . cefTRIAXone (ROCEPHIN)  IV  1 g Intravenous Q24H  . citalopram  20 mg Oral q morning - 10a  . fentaNYL      . guaiFENesin  600 mg Oral BID  . heparin  5,000 Units Subcutaneous 3 times per day  . insulin aspart  0-9 Units Subcutaneous 6 times per day  . levothyroxine  50 mcg Oral QAC breakfast  . lidocaine-EPINEPHrine      . magnesium oxide  200 mg Oral Daily  . midazolam      .  simvastatin  40 mg Oral q1800  . sodium chloride  3 mL Intravenous Q12H  . tiotropium  18 mcg Inhalation Daily    Continuous Infusions: . sodium chloride    . diltiazem (CARDIZEM) infusion 5 mg/hr (06/27/13 0600)  . lactated ringers      Past Medical History  Diagnosis Date  . Sick sinus syndrome   . Hematoma     At the site of the pacemaker insertion.  . Hypotension   . Dyslipidemia   . History of atrial fibrillation   . Arthritis     Osteoarthritis  . Port-a-cath in place 12/08/10  . Status post chemotherapy completed 03/2011    R - CHOP q 3 weeks x 6  . S/P radiation therapy 05/28/2011 - 06/26/2011    Right Abdomen and Right Pelvis/3060 cGy in 17 Fractions  . Hypertension   . ASHD (arteriosclerotic heart disease)   .  Gout   . COPD (chronic obstructive pulmonary disease)   . Asthma   . Hyperlipidemia   . Diabetes mellitus   . Hypothyroidism   . Diffuse large B cell lymphoma     Dr. Chisom,oncology  . Cancer 10/2010    large B cell lymphoma  . 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 Pacemaker     left chest  . Dysrhythmia     Hx. A. Fib, sick sinus syndrome- Pacemaker inserted.  . Cataracts, bilateral     Past Surgical History  Procedure Laterality Date  . Pacemaker insertion      Lead revision completed August 04, 2006- left chest G. Lovena Le  . Tubal ligation      Bilateral  . Insert / replace / remove pacemaker    . Abdominal hysterectomy    . Biopsy stomach  11/03/10    Soft Tissue Mass, Biopsy, Right Lower Quadrant Mesenteric Mass- High Grade Non-Hodgkins B Cell Lymphoma  with Flow Cytometry  . Bone biopsy  11/24/10    Bone Marrow, Aspirate Biospy, and clot, Left - No Involvement of Non-Hodgkin's Lymphoma Identified  . Portacath placement Right 06-23-13  . Cholecystectomy      laparoscopic  . Bladder suspension      Antonieta Iba, RD, LDN Clinical Inpatient Dietitian Pager:  [phone] Weekend and after hours pager:  [phone]

## 2013-06-27 NOTE — Preoperative (Signed)
Beta Blockers   Reason not to administer Beta Blockers:Not Applicable 

## 2013-06-27 NOTE — Progress Notes (Signed)
Pt urinated in ED prior to coming to unit.  No urine output since arrived here around 2330.  Pt awakened and asked if needed to urinate but she keeps falling asleep in mid sentence.  Will pass on to day shift RN if pt does not urinate before shift end.

## 2013-06-27 NOTE — Procedures (Signed)
Successful [place] and fluoroscopic guided placement of a right sided PCN with end coiled and locked in the renal pelvis. PCN connected to gravity bag. No immediate post procedural complications.

## 2013-06-27 NOTE — Progress Notes (Signed)
Patient ID: [name], female   DOB: 1940/10/21, 73 y.o.   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 Her creatinine continues to climb from 1.13 Apr 2013 to 1.9 on admission. Further treatment for her cancer is planned and she was scheduled for elective cystoscopy and right ureteral stent today by urology; however, pt is now febrile with leukocytosis, soft BP and many bacteria on UA. Imaging studies were reviewed by Dr. Pascal Lux. Additional PMH as below. Exam: pt awake/alert; chest- CTA bilat; rt chest PAC; left sided pacemaker; heart- tachy, irreg; abd- obese, soft,+BS, right CVA tenderness; ext- FROM.   Filed Vitals:   06/27/13 0700 06/27/13 0800 06/27/13 1020 06/27/13 1100  BP: 92/53 83/52 103/50 92/48  Pulse: 96 108 102 120  Temp:      TempSrc:      Resp: 28 18 26 24   Height:      Weight:      SpO2: 98% 97% 95% 96%   Past Medical History  Diagnosis Date  . Sick sinus syndrome   . Hematoma     At the site of the pacemaker insertion.  . Hypotension   . Dyslipidemia   . History of atrial fibrillation   . Arthritis     Osteoarthritis  . Port-a-cath in place 12/08/10  . Status post chemotherapy completed 03/2011    R - CHOP q 3 weeks x 6  . S/P radiation therapy 05/28/2011 - 06/26/2011    Right Abdomen and Right Pelvis/3060 cGy in 17 Fractions  . Hypertension   . ASHD (arteriosclerotic heart disease)   . Gout   . COPD (chronic obstructive pulmonary disease)   . Asthma   . Hyperlipidemia   . Diabetes mellitus   . Hypothyroidism   . Diffuse large B cell lymphoma     Dr. Chisom,oncology  . Cancer 10/2010    large B cell lymphoma  . 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 Pacemaker     left chest  . Dysrhythmia     Hx. A. Fib, sick sinus syndrome- Pacemaker inserted.  . Cataracts, bilateral     Past Surgical History  Procedure Laterality Date  . Pacemaker insertion      Lead revision completed August 04, 2006- left chest G. Lovena Le  . Tubal ligation      Bilateral  . Insert / replace / remove pacemaker    . Abdominal hysterectomy    . Biopsy stomach  11/03/10    Soft Tissue Mass, Biopsy, Right Lower Quadrant Mesenteric Mass- High Grade Non-Hodgkins B Cell Lymphoma  with Flow Cytometry  . Bone biopsy  11/24/10    Bone Marrow, Aspirate Biospy, and clot, Left - No Involvement of Non-Hodgkin's Lymphoma Identified  . Portacath placement Right 06-23-13  . Cholecystectomy      laparoscopic  . Bladder suspension     Dg Chest 2 View  06/26/2013   CLINICAL DATA:  Cough, shortness of breath, weakness, history COPD, hypertension, asthma, diabetes, non-Hodgkin's lymphoma  EXAM: CHEST  2 VIEW  COMPARISON:  02/12/2013  FINDINGS: Right subclavian power port with tip projecting over SVC.  Left subclavian transvenous pacemaker leads project at right atrium and right ventricle.  Enlargement of cardiac silhouette with elongation of thoracic aorta.  Mediastinal contours and pulmonary vascularity otherwise normal.  Lungs clear.  No pleural effusion or pneumothorax.  Bones  demineralized.  IMPRESSION: Enlargement of cardiac silhouette post pacemaker.  No acute abnormalities.   Electronically Signed   By: Lavonia Dana M.D.   On: 06/26/2013 17:29   Ct Chest W Contrast  06/05/2013   CLINICAL DATA:  Non-Hodgkin lymphoma.  EXAM: CT CHEST, ABDOMEN, AND PELVIS WITH CONTRAST  TECHNIQUE: Multidetector CT imaging of the chest, abdomen and pelvis was performed following the standard protocol during bolus administration of intravenous contrast.  CONTRAST:  20mL OMNIPAQUE IOHEXOL 300 MG/ML  SOLN  COMPARISON:  CT ABD/PELVIS W CM dated 03/27/2013  FINDINGS: CT CHEST FINDINGS  Right IJ Port-A-Cath terminates in the SVC. Mediastinal lymph nodes are not enlarged by CT size criteria. No hilar or axillary adenopathy. Heart is  enlarged. No pericardial effusion. Small hiatal hernia.  Image quality is degraded by respiratory motion. 3 mm right upper lobe nodule is unchanged from 03/27/2013. No pleural fluid. Airway is unremarkable.  CT ABDOMEN AND PELVIS FINDINGS  Liver appears slightly decreased in attenuation diffusely. Cholecystectomy. Adrenal glands are unremarkable. There is slightly decreased attenuation of the right renal parenchyma, when compared with the left, with associated moderate to severe right hydronephrosis. Findings are new from 03/27/2013. Hydronephrosis is seen to the level of a retroperitoneal nodal mass anterior to the inferior vena cava, measuring 2.6 x 3.3 cm (previously 2.0 x 2.4 cm when remeasured). Additional retroperitoneal adenopathy with ill-defined soft tissue extending along the course of the abdominal aorta and inferior vena cava. This expands to a nodal mass at the aortic bifurcation, measuring 3.2 x 4.6 cm. Findings are progressive from 03/27/2013.  A 10 mm low-attenuation lesion in the upper pole right kidney unchanged. There is lack contrast excretion from the right kidney on delayed nephrographic phase imaging. Left kidney, spleen, pancreas, stomach and small bowel are unremarkable. A fair amount of stool is seen in the colon. Hysterectomy. No free fluid. No worrisome lytic or sclerotic lesions. Degenerative changes are seen in the spine.  IMPRESSION: 1. Enlarging retroperitoneal adenopathy results in moderate to severe right hydronephrosis, with evidence of decreased function in the right kidney. These results will be called to the ordering clinician or representative by the Radiologist Assistant, and communication documented in the PACS Dashboard. 2. Liver appears fatty. 3. Fair amount of stool in the colon is indicative of constipation.   Electronically Signed   By: Lorin Picket M.D.   On: 06/05/2013 16:37   Ct Abdomen Pelvis W Contrast  06/05/2013   CLINICAL DATA:  Non-Hodgkin lymphoma.  EXAM:  CT CHEST, ABDOMEN, AND PELVIS WITH CONTRAST  TECHNIQUE: Multidetector CT imaging of the chest, abdomen and pelvis was performed following the standard protocol during bolus administration of intravenous contrast.  CONTRAST:  58mL OMNIPAQUE IOHEXOL 300 MG/ML  SOLN  COMPARISON:  CT ABD/PELVIS W CM dated 03/27/2013  FINDINGS: CT CHEST FINDINGS  Right IJ Port-A-Cath terminates in the SVC. Mediastinal lymph nodes are not enlarged by CT size criteria. No hilar or axillary adenopathy. Heart is enlarged. No pericardial effusion. Small hiatal hernia.  Image quality is degraded by respiratory motion. 3 mm right upper lobe nodule is unchanged from 03/27/2013. No pleural fluid. Airway is unremarkable.  CT ABDOMEN AND PELVIS FINDINGS  Liver appears slightly decreased in attenuation diffusely. Cholecystectomy. Adrenal glands are unremarkable. There is slightly decreased attenuation of the right renal parenchyma, when compared with the left, with associated moderate to severe right hydronephrosis. Findings are new from 03/27/2013. Hydronephrosis is seen to the level of a retroperitoneal nodal mass anterior to the  inferior vena cava, measuring 2.6 x 3.3 cm (previously 2.0 x 2.4 cm when remeasured). Additional retroperitoneal adenopathy with ill-defined soft tissue extending along the course of the abdominal aorta and inferior vena cava. This expands to a nodal mass at the aortic bifurcation, measuring 3.2 x 4.6 cm. Findings are progressive from 03/27/2013.  A 10 mm low-attenuation lesion in the upper pole right kidney unchanged. There is lack contrast excretion from the right kidney on delayed nephrographic phase imaging. Left kidney, spleen, pancreas, stomach and small bowel are unremarkable. A fair amount of stool is seen in the colon. Hysterectomy. No free fluid. No worrisome lytic or sclerotic lesions. Degenerative changes are seen in the spine.  IMPRESSION: 1. Enlarging retroperitoneal adenopathy results in moderate to severe  right hydronephrosis, with evidence of decreased function in the right kidney. These results will be called to the ordering clinician or representative by the Radiologist Assistant, and communication documented in the PACS Dashboard. 2. Liver appears fatty. 3. Fair amount of stool in the colon is indicative of constipation.   Electronically Signed   By: Lorin Picket M.D.   On: 06/05/2013 16:37  Results for orders placed during the hospital encounter of 06/26/13  MRSA PCR SCREENING      Result Value Ref Range   MRSA by PCR NEGATIVE  NEGATIVE  CBC WITH DIFFERENTIAL      Result Value Ref Range   WBC 11.4 (*) 4.0 - 10.5 K/uL   RBC 3.86 (*) 3.87 - 5.11 MIL/uL   Hemoglobin 11.7 (*) 12.0 - 15.0 g/dL   HCT 36.9  36.0 - 46.0 %   MCV 95.6  78.0 - 100.0 fL   MCH 30.3  26.0 - 34.0 pg   MCHC 31.7  30.0 - 36.0 g/dL   RDW 15.9 (*) 11.5 - 15.5 %   Platelets 185  150 - 400 K/uL   Neutrophils Relative % 81 (*) 43 - 77 %   Neutro Abs 9.3 (*) 1.7 - 7.7 K/uL   Lymphocytes Relative 6 (*) 12 - 46 %   Lymphs Abs 0.7  0.7 - 4.0 K/uL   Monocytes Relative 13 (*) 3 - 12 %   Monocytes Absolute 1.5 (*) 0.1 - 1.0 K/uL   Eosinophils Relative 0  0 - 5 %   Eosinophils Absolute 0.0  0.0 - 0.7 K/uL   Basophils Relative 0  0 - 1 %   Basophils Absolute 0.0  0.0 - 0.1 K/uL  COMPREHENSIVE METABOLIC PANEL      Result Value Ref Range   Sodium 137  137 - 147 mEq/L   Potassium 4.4  3.7 - 5.3 mEq/L   Chloride 95 (*) 96 - 112 mEq/L   CO2 25  19 - 32 mEq/L   Glucose, Bld 184 (*) 70 - 99 mg/dL   BUN 25 (*) 6 - 23 mg/dL   Creatinine, Ser 1.95 (*) 0.50 - 1.10 mg/dL   Calcium 10.1  8.4 - 10.5 mg/dL   Total Protein 6.7  6.0 - 8.3 g/dL   Albumin 3.5  3.5 - 5.2 g/dL   AST 17  0 - 37 U/L   ALT 11  0 - 35 U/L   Alkaline Phosphatase 54  39 - 117 U/L   Total Bilirubin 0.4  0.3 - 1.2 mg/dL   GFR calc non Af Amer 24 (*) >90 mL/min   GFR calc Af Amer 28 (*) >90 mL/min  URINALYSIS, ROUTINE W REFLEX MICROSCOPIC      Result Value  Ref Range   Color, Urine YELLOW  YELLOW   APPearance CLOUDY (*) CLEAR   Specific Gravity, Urine 1.014  1.005 - 1.030   pH 6.0  5.0 - 8.0   Glucose, UA NEGATIVE  NEGATIVE mg/dL   Hgb urine dipstick SMALL (*) NEGATIVE   Bilirubin Urine NEGATIVE  NEGATIVE   Ketones, ur NEGATIVE  NEGATIVE mg/dL   Protein, ur 100 (*) NEGATIVE mg/dL   Urobilinogen, UA 0.2  0.0 - 1.0 mg/dL   Nitrite NEGATIVE  NEGATIVE   Leukocytes, UA SMALL (*) NEGATIVE  TROPONIN I      Result Value Ref Range   Troponin I <0.30  <0.30 ng/mL  PROTIME-INR      Result Value Ref Range   Prothrombin Time 14.4  11.6 - 15.2 seconds   INR 1.14  0.00 - 1.49  URINE MICROSCOPIC-ADD ON      Result Value Ref Range   Squamous Epithelial / LPF RARE  RARE   WBC, UA TOO NUMEROUS TO COUNT  <3 WBC/hpf   Bacteria, UA MANY (*) RARE  GLUCOSE, CAPILLARY      Result Value Ref Range   Glucose-Capillary 129 (*) 70 - 99 mg/dL  GLUCOSE, CAPILLARY      Result Value Ref Range   Glucose-Capillary 209 (*) 70 - 99 mg/dL   Comment 1 Documented in Chart     Comment 2 Notify RN    GLUCOSE, CAPILLARY      Result Value Ref Range   Glucose-Capillary 136 (*) 70 - 99 mg/dL  I-STAT CG4 LACTIC ACID, ED      Result Value Ref Range   Lactic Acid, Venous 1.55  0.5 - 2.2 mmol/L  CBG MONITORING, ED      Result Value Ref Range   Glucose-Capillary 206 (*) 70 - 99 mg/dL  A/P: Pt with hx NHL, right hydronephrosis from associated LAD, worsening renal function, leukocytosis, fever, bacteriuria ; planned cysto with rt ureteral stent was cancelled secondary to sepsis picture and pt now scheduled for right perc nephrostomy. Details/risks of procedure d/w pt /niece with their understanding and consent.

## 2013-06-27 NOTE — Anesthesia Preprocedure Evaluation (Addendum)
Anesthesia Evaluation  Patient identified by MRN, date of birth, ID band Patient awake and Patient confused    Reviewed: Allergy & Precautions, H&P , NPO status , Patient's Chart, lab work & pertinent test results  Airway Mallampati: II TM Distance: >3 FB Neck ROM: Full    Dental no notable dental hx.    Pulmonary asthma , COPDformer smoker,    + decreased breath sounds      Cardiovascular hypertension, Pt. on medications + CAD + dysrhythmias Atrial Fibrillation + pacemaker Rhythm:Regular Rate:Normal     Neuro/Psych negative neurological ROS  negative psych ROS   GI/Hepatic negative GI ROS, Neg liver ROS,   Endo/Other  negative endocrine ROSdiabetesMorbid obesity  Renal/GU Renal diseasenegative Renal ROS  negative genitourinary   Musculoskeletal negative musculoskeletal ROS (+)   Abdominal   Peds negative pediatric ROS (+)  Hematology negative hematology ROS (+)   Anesthesia Other Findings   Reproductive/Obstetrics negative OB ROS                         Anesthesia Physical Anesthesia Plan  ASA: IV  Anesthesia Plan: General   Post-op Pain Management:    Induction: Intravenous  Airway Management Planned: Oral ETT  Additional Equipment:   Intra-op Plan:   Post-operative Plan: Extubation in OR and Possible Post-op intubation/ventilation  Informed Consent: I have reviewed the patients History and Physical, chart, labs and discussed the procedure including the risks, benefits and alternatives for the proposed anesthesia with the patient or authorized representative who has indicated his/her understanding and acceptance.   Dental advisory given  Plan Discussed with: CRNA  Anesthesia Plan Comments: (Pt at high risk from anesthesia complications. Not adequately rate controlled in afib on cardizem drip. Will increase rate. Confused. Septic. Possible post-op ventilation.)         Anesthesia Quick Evaluation

## 2013-06-27 NOTE — Progress Notes (Signed)
CARE MANAGEMENT NOTE 06/27/2013  Patient:  [name], [name]   Account Number:  [id number]  Date Initiated:  06/27/2013  Documentation initiated by:  Eyvonne Burchfield  Subjective/Objective Assessment:   pt with schedule urinary surg felling ill cancelled symoptoms worsened and was brought to ed via ems, found to be sepstic with obstruction and severe urinary infection. hypotensive.     Action/Plan:   patient lives alone and independently, has family in area for support.   Anticipated DC Date:  06/30/2013   Anticipated DC Plan:  HOME/SELF CARE  In-house referral  NA      DC Planning Services  NA      [facility] Choice  NA   Choice offered to / List presented to:  NA      DME agency  NA     [place] arranged  NA      [place] agency  NA   Status of service:  In process, will continue to follow Medicare Important Message given?  NA - LOS <3 / Initial given by admissions (If response is "NO", the following Medicare IM given date fields will be blank) Date Medicare IM given:   Date Additional Medicare IM given:    Discharge Disposition:    Per UR Regulation:  Reviewed for med. necessity/level of care/duration of stay  If discussed at [place] of Stay Meetings, dates discussed:    Comments:  [id number]/Ceaira Ernster Eldridge Dace, [place], [place] [phone] Chart Reviewed for discharge and hospital needs. Discharge needs at time of review:  None present will follow for needs. Review of patient progress due on [id number].

## 2013-06-27 NOTE — Consult Note (Addendum)
Consult: right hydronephrosis, renal failure, sepsis Requested by: Dr. 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 Her creatinine continues to climb from 1.13 Apr 2013 to 1.9 on admission. Further treatment for her cancer is planned and she was scheduled for elective cystoscopy and right ureteral stent today. When I saw her last week in the office she had no voiding complaints and a U/A with negative LE/nitrite and no bacteria seen on microscopy. She was stable.   She was admitted last night for fever, chills, N/V. She was found to have a septic like picture, a dirty UA with many bacteria. Cx pending. On abx. She responded to IV fluids, but unfortunately here in the pre-op area has cough, SOB and is not rate controlled with HR in the   Past Medical History  Diagnosis Date  . Sick sinus syndrome   . Hematoma     At the site of the pacemaker insertion.  . Hypotension   . Dyslipidemia   . History of atrial fibrillation   . Arthritis     Osteoarthritis  . Port-a-cath in place 12/08/10  . Status post chemotherapy completed 03/2011    R - CHOP q 3 weeks x 6  . S/P radiation therapy 05/28/2011 - 06/26/2011    Right Abdomen and Right Pelvis/3060 cGy in 17 Fractions  . Hypertension   . ASHD (arteriosclerotic heart disease)   . Gout   . COPD (chronic obstructive pulmonary disease)   . Asthma   . Hyperlipidemia   . Diabetes mellitus   . Hypothyroidism   . Diffuse large B cell lymphoma     Dr. Chisom,oncology  . Cancer 10/2010    large B cell lymphoma  . 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 Pacemaker     left chest  . Dysrhythmia     Hx. A. Fib, sick sinus syndrome- Pacemaker inserted.  . Cataracts, bilateral    Past Surgical History  Procedure Laterality Date  . Pacemaker insertion      Lead revision completed August 04, 2006- left chest G. Lovena Le   . Tubal ligation      Bilateral  . Insert / replace / remove pacemaker    . Abdominal hysterectomy    . Biopsy stomach  11/03/10    Soft Tissue Mass, Biopsy, Right Lower Quadrant Mesenteric Mass- High Grade Non-Hodgkins B Cell Lymphoma  with Flow Cytometry  . Bone biopsy  11/24/10    Bone Marrow, Aspirate Biospy, and clot, Left - No Involvement of Non-Hodgkin's Lymphoma Identified  . Portacath placement Right 06-23-13  . Cholecystectomy      laparoscopic  . Bladder suspension      Home Medications:  Prescriptions prior to admission  Medication Sig Dispense Refill  . albuterol (PROVENTIL) (2.5 MG/3ML) 0.083% nebulizer solution Take 3 mLs (2.5 mg total) by nebulization every 6 (six) hours as needed for shortness of breath.  75 mL  6  . allopurinol (ZYLOPRIM) 300 MG tablet Take 300 mg by mouth daily.      . Cholecalciferol (VITAMIN D3) 2000 UNITS TABS Take 4,000 Units by mouth 2 (two) times daily.       . citalopram (CELEXA) 20 MG tablet Take 20 mg by mouth every morning.       . diltiazem (CARDIZEM CD) 180 MG 24 hr capsule Take 180 mg by mouth every morning.       Marland Kitchen  docusate sodium (COLACE) 100 MG capsule Take 100 mg by mouth daily as needed for mild constipation.      Marland Kitchen guaiFENesin (MUCINEX) 600 MG 12 hr tablet Take 600 mg by mouth 2 (two) times daily.      Marland Kitchen levothyroxine (SYNTHROID, LEVOTHROID) 100 MCG tablet Take 50 mcg by mouth daily before breakfast.      . magnesium oxide (MAG-OX) 400 (241.3 MG) MG tablet Take 0.5 tablets (200 mg total) by mouth daily.  30 tablet  0  . metFORMIN (GLUCOPHAGE-XR) 500 MG 24 hr tablet Take 1,000 mg by mouth 2 (two) times daily.      . ondansetron (ZOFRAN) 8 MG tablet Take 8 mg by mouth 2 (two) times daily as needed for nausea or vomiting.      . potassium chloride (K-DUR) 10 MEQ tablet Take 20 mEq by mouth daily. Take 2 tablets my mouth daily      . pravastatin (PRAVACHOL) 40 MG tablet Take 40 mg by mouth at bedtime.       . prochlorperazine (COMPAZINE)  10 MG tablet Take 10 mg by mouth every 6 (six) hours as needed for nausea or vomiting.      . tiotropium (SPIRIVA) 18 MCG inhalation capsule Place 18 mcg into inhaler and inhale daily.      Marland Kitchen warfarin (COUMADIN) 5 MG tablet Take 5 mg by mouth daily. 2.5 mg tablet on Tuesday and Thursday and all others days is 5 mg      . lidocaine-prilocaine (EMLA) cream Apply topically as needed. Apply to port a cath site one hour before needle stick as needed.  30 g  2   Allergies:  Allergies  Allergen Reactions  . Codeine Nausea Only  . Penicillins     Unknown    Family History  Problem Relation Age of Onset  . Stroke Mother   . Stroke Father   . Aneurysm Son     Brain  . Cancer Sister      1 sister had vaginal cancer  . Cancer Brother     Prostate   Social History:  reports that she quit smoking about 2 years ago. Her smoking use included Cigarettes. She has a 45 pack-year smoking history. She has never used smokeless tobacco. She reports that she does not drink alcohol or use illicit drugs.  ROS: A complete review of systems was performed.  All systems are negative except for pertinent findings as noted. Review of Systems  Constitutional: Positive for fever, chills, malaise/fatigue and diaphoresis.  Respiratory: Positive for cough and shortness of breath.   Neurological: Positive for weakness.     Physical Exam:  Vital signs in last 24 hours: Temp:  [97.5 F (36.4 C)-101 F (38.3 C)] 101 F (38.3 C) (03/17 0400) Pulse Rate:  [67-160] 108 (03/17 0800) Resp:  [18-29] 18 (03/17 0800) BP: (83-129)/(39-82) 83/52 mmHg (03/17 0800) SpO2:  [87 %-100 %] 97 % (03/17 0800) Weight:  [103.6 kg (228 lb 6.3 oz)] 103.6 kg (228 lb 6.3 oz) (03/16 2300) General:  Alert and oriented, No acute distress HEENT: Normocephalic, atraumatic Neck: No JVD or lymphadenopathy Cardiovascular: tachy and irregular Lungs: Regular rate and effort, rhonchi  Abdomen: Soft, nontender, nondistended, no abdominal  masses Back: No CVA tenderness Extremities: No edema Neurologic: Grossly intact  Laboratory Data:  Results for orders placed during the hospital encounter of 06/26/13 (from the past 24 hour(s))  CBC WITH DIFFERENTIAL     Status: Abnormal   Collection Time  06/26/13  4:35 PM      Result Value Ref Range   WBC 11.4 (*) 4.0 - 10.5 K/uL   RBC 3.86 (*) 3.87 - 5.11 MIL/uL   Hemoglobin 11.7 (*) 12.0 - 15.0 g/dL   HCT 36.9  36.0 - 46.0 %   MCV 95.6  78.0 - 100.0 fL   MCH 30.3  26.0 - 34.0 pg   MCHC 31.7  30.0 - 36.0 g/dL   RDW 15.9 (*) 11.5 - 15.5 %   Platelets 185  150 - 400 K/uL   Neutrophils Relative % 81 (*) 43 - 77 %   Neutro Abs 9.3 (*) 1.7 - 7.7 K/uL   Lymphocytes Relative 6 (*) 12 - 46 %   Lymphs Abs 0.7  0.7 - 4.0 K/uL   Monocytes Relative 13 (*) 3 - 12 %   Monocytes Absolute 1.5 (*) 0.1 - 1.0 K/uL   Eosinophils Relative 0  0 - 5 %   Eosinophils Absolute 0.0  0.0 - 0.7 K/uL   Basophils Relative 0  0 - 1 %   Basophils Absolute 0.0  0.0 - 0.1 K/uL  COMPREHENSIVE METABOLIC PANEL     Status: Abnormal   Collection Time    06/26/13  4:35 PM      Result Value Ref Range   Sodium 137  137 - 147 mEq/L   Potassium 4.4  3.7 - 5.3 mEq/L   Chloride 95 (*) 96 - 112 mEq/L   CO2 25  19 - 32 mEq/L   Glucose, Bld 184 (*) 70 - 99 mg/dL   BUN 25 (*) 6 - 23 mg/dL   Creatinine, Ser 1.95 (*) 0.50 - 1.10 mg/dL   Calcium 10.1  8.4 - 10.5 mg/dL   Total Protein 6.7  6.0 - 8.3 g/dL   Albumin 3.5  3.5 - 5.2 g/dL   AST 17  0 - 37 U/L   ALT 11  0 - 35 U/L   Alkaline Phosphatase 54  39 - 117 U/L   Total Bilirubin 0.4  0.3 - 1.2 mg/dL   GFR calc non Af Amer 24 (*) >90 mL/min   GFR calc Af Amer 28 (*) >90 mL/min  TROPONIN I     Status: None   Collection Time    06/26/13  4:35 PM      Result Value Ref Range   Troponin I <0.30  <0.30 ng/mL  PROTIME-INR     Status: None   Collection Time    06/26/13  4:35 PM      Result Value Ref Range   Prothrombin Time 14.4  11.6 - 15.2 seconds   INR 1.14   0.00 - 1.49  I-STAT CG4 LACTIC ACID, ED     Status: None   Collection Time    06/26/13  4:46 PM      Result Value Ref Range   Lactic Acid, Venous 1.55  0.5 - 2.2 mmol/L  URINALYSIS, ROUTINE W REFLEX MICROSCOPIC     Status: Abnormal   Collection Time    06/26/13  6:23 PM      Result Value Ref Range   Color, Urine YELLOW  YELLOW   APPearance CLOUDY (*) CLEAR   Specific Gravity, Urine 1.014  1.005 - 1.030   pH 6.0  5.0 - 8.0   Glucose, UA NEGATIVE  NEGATIVE mg/dL   Hgb urine dipstick SMALL (*) NEGATIVE   Bilirubin Urine NEGATIVE  NEGATIVE   Ketones, ur NEGATIVE  NEGATIVE mg/dL  Protein, ur 100 (*) NEGATIVE mg/dL   Urobilinogen, UA 0.2  0.0 - 1.0 mg/dL   Nitrite NEGATIVE  NEGATIVE   Leukocytes, UA SMALL (*) NEGATIVE  URINE MICROSCOPIC-ADD ON     Status: Abnormal   Collection Time    06/26/13  6:23 PM      Result Value Ref Range   Squamous Epithelial / LPF RARE  RARE   WBC, UA TOO NUMEROUS TO COUNT  <3 WBC/hpf   Bacteria, UA MANY (*) RARE  CBG MONITORING, ED     Status: Abnormal   Collection Time    06/26/13  9:50 PM      Result Value Ref Range   Glucose-Capillary 206 (*) 70 - 99 mg/dL  MRSA PCR SCREENING     Status: None   Collection Time    06/26/13 11:19 PM      Result Value Ref Range   MRSA by PCR NEGATIVE  NEGATIVE  GLUCOSE, CAPILLARY     Status: Abnormal   Collection Time    06/26/13 11:55 PM      Result Value Ref Range   Glucose-Capillary 129 (*) 70 - 99 mg/dL  GLUCOSE, CAPILLARY     Status: Abnormal   Collection Time    06/27/13  3:53 AM      Result Value Ref Range   Glucose-Capillary 209 (*) 70 - 99 mg/dL   Comment 1 Documented in Chart     Comment 2 Notify RN    GLUCOSE, CAPILLARY     Status: Abnormal   Collection Time    06/27/13  7:40 AM      Result Value Ref Range   Glucose-Capillary 136 (*) 70 - 99 mg/dL   Recent Results (from the past 240 hour(s))  MRSA PCR SCREENING     Status: None   Collection Time    06/26/13 11:19 PM      Result Value Ref  Range Status   MRSA by PCR NEGATIVE  NEGATIVE Final   Comment:            The GeneXpert MRSA Assay (FDA     approved for NASAL specimens     only), is one component of a     comprehensive MRSA colonization     surveillance program. It is not     intended to diagnose MRSA     infection nor to guide or     monitor treatment for     MRSA infections.   Creatinine:  Recent Labs  06/23/13 1105 06/26/13 1635  CREATININE 1.76* 1.95*    Impression/Assessment/Plan:  -Sepsis with hydronephrosis, ARF - she certainly needs decompression of her right kidney in the face of inc. Cr, need for further cancer treatment, but more so now with a dirty UA and sepsis. As I discussed with the patient last week and again today there is a chance of failure to gain retrograde access and failure of stent to adequately drain the kidney. These concerns are magnified when there is infection. In addition, I spoke with Dr. Aris Lot and the patient is simply not stable for anesthesia at this point. Given all these factors the alternative would be right percutaneous nephrostomy with IR. I had her coumadin held not for the cystoscopy/stent, but in the situation she would need a nephrostomy, so the timing is fortunate. I discussed with the patient the nature, potential benefits, risks and alternatives to right percutaneous nephrostomy, including side effects of the proposed treatment, the likelihood of the patient achieving  the goals of the procedure, and any potential problems that might occur during the procedure or recuperation. All questions answered. Patient elects to proceed.    [name] 06/27/2013, 9:17 AM   ADD: I spoke with family (son, daughter-in-law, cousin), Dr. Algis Liming and Dr. Pascal Lux about the patient.

## 2013-06-27 NOTE — Progress Notes (Signed)
Patient ID: [name], female   DOB: 10-14-1940, 73 y.o.   MRN: [id number]  Patient sleeping, so I didn't wake her.  PE: NAD Reg resp rate and depth Rt Nx bag with good clear UOP   Imp / plan - Continue Nx tube.  I spoke with IR - coumadin can be resumed tomorrow and they will try to internalize the nephrostomy to a stent in 1-2 weeks as an outpatient - I'll place an order. She will need to go home with the nephrostomy.

## 2013-06-27 NOTE — Progress Notes (Signed)
Pt is here in holding room for cysto/stent.  Afib. Not rate controlled despite cardizem drip. Hr 130-140.  She is sob. Cannot speak in full sentences. Confused  Spoke with Dr. Junious Silk. Pt is at high risk for peri-operative anesthesia complications. Possible post-op ventilation. Lack of rate control makes her high risk for CHF and peri-op MI. With her morbid obesity, airway management with sedation would be difficult.  Perhaps percutaneous nephrostomy tube under local should be considered to minimize risk.  Montez Hageman MD

## 2013-06-27 NOTE — Progress Notes (Signed)
ANTICOAGULATION CONSULT NOTE - Initial Consult  Pharmacy Consult for IV heparin / Coumadin Indication: Atrial fibrillation  Allergies  Allergen Reactions  . Codeine Nausea Only  . Penicillins     Unknown    Patient Measurements: Height: 4\' 8"  (142.2 cm) Weight: 228 lb 6.3 oz (103.6 kg) IBW/kg (Calculated) : 36.3 Heparin Dosing Weight: 71kg  Vital Signs: Temp: 101.7 F (38.7 C) (03/17 0800) Temp src: Oral (03/17 0800) BP: 101/71 mmHg (03/17 1257) Pulse Rate: 111 (03/17 1257)  Labs:  Recent Labs  06/26/13 1635  HGB 11.7*  HCT 36.9  PLT 185  LABPROT 14.4  INR 1.14  CREATININE 1.95*  TROPONINI <0.30    Estimated Creatinine Clearance: 26 ml/min (by C-G formula based on Cr of 1.95).   Medical History: Past Medical History  Diagnosis Date  . Sick sinus syndrome   . Hematoma     At the site of the pacemaker insertion.  . Hypotension   . Dyslipidemia   . History of atrial fibrillation   . Arthritis     Osteoarthritis  . Port-a-cath in place 12/08/10  . Status post chemotherapy completed 03/2011    R - CHOP q 3 weeks x 6  . S/P radiation therapy 05/28/2011 - 06/26/2011    Right Abdomen and Right Pelvis/3060 cGy in 17 Fractions  . Hypertension   . ASHD (arteriosclerotic heart disease)   . Gout   . COPD (chronic obstructive pulmonary disease)   . Asthma   . Hyperlipidemia   . Diabetes mellitus   . Hypothyroidism   . Diffuse large B cell lymphoma     Dr. Chisom,oncology  . Cancer 10/2010    large B cell lymphoma  . 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 Pacemaker     left chest  . Dysrhythmia     Hx. A. Fib, sick sinus syndrome- Pacemaker inserted.  . Cataracts, bilateral    Assessment: 66 yoF with PMHx DLBCL, DM, Afib on chronic coumadin, and hydronephrosis presents with severe sepsis 2/2 complicated UTI/acute pyelonephritis, Afib, and A/CKD. Placed on CTX pending culture results. Pt had planned stent placement  3/17 for obstructive hydronephrosis, however cancelled due to not feeling well. After admitted to [facility], procedure scheduled inpt however pt unstable for anesthesia, therefore performed via IR with perc placement. Pharmacy consulted to dose IV heparin / Warfarin bridge post-op per pharmacy, heparin to start ~1800, warfarin to start 3/18  3/17:  Currently in Afib. Remains on cardizem gtt. Per Dr. Pascal Lux (IR), uncomplicated procedure.  Finished ~1300.   LD coumadin 3/13. Home dose coumadin 5mg  daily except 2.5mg  T/R.  3/16 INR 1.14, subtherapeutic.  CBC: Hgb 11.7, stable, plts ok.  LD SQ hep this AM at 6AM.   Goal of Therapy:  Heparin level 0.3-0.7 units/ml Monitor platelets by anticoagulation protocol: Yes INR goal 2-3   Plan:  Start heparin at 1000 units/hr at 1800, no bolus d/t procedure. Coumadin first dose tomorrow. F/u timing of when heparin charted and order 1st confirmation level.  Daily PT/INR, heparin level  Ralene Bathe, PharmD, BCPS 06/27/2013, 2:05 PM  Pager: [phone]

## 2013-06-27 NOTE — Progress Notes (Signed)
PROGRESS NOTE    [name] ZYS:[id number] DOB: 06-12-1940 DOA: 06/26/2013 PCP: [name], MD Primary cardiologist: Dr. Cristopher Peru Primary oncologist: Dr. Concha Norway  HPI/Brief narrative 73 year old female with history of sick sinus syndrome, status post PPM, dyslipidemia, PAF on chronic Coumadin, hypertension, COPD, type II DM, hyperlipidemia, hypothyroid, recurrent NHL, with obstructive right hydronephrosis secondary to lymphoma, was planned for elective cystoscopy and right ureteral stent on 3/17 but admitted on 3/16 with sepsis likely secondary to UTI/pyelonephritis, delirium and rapid A. fib. He was admitted to step down unit. Since patient not stable for ureteral stent, urology recommending right percutaneous nephrostomy by IR. Coumadin had been held in case she would need a nephrostomy and not for cystoscopy/stent.   Assessment/Plan:  1. Severe sepsis secondary to complicated UTI/acute pyelonephritis: Treated with IV fluids and IV Rocephin pending urine culture results. 2. Obstructive right hydronephrosis: Secondary to recurrent NHL. Urology input appreciated and discussed with Dr. Vivianne Spence for right percutaneous nephrostomy by interventional radiology. 3. A. fib with RVR: Titrate Cardizem and treat sepsis. Coumadin on hold for nephrostomy. Interventional radiology to advise when Coumadin can be resumed post procedure. If heart rate not adequately controlled, consider cardiology consultation. 4. Acute on chronic kidney disease: Creatinine in January was in the 1.3 range. Now likely worsened by sepsis and hydronephrosis. Follow BMP post nephrostomy and hydration. 5. Delirium: Likely secondary to infection. Improved/possibly resolved 6. Chronic anemia: Stable 7. Status post PPM 8. Hypertension: Patient running soft blood pressures. 9. COPD: Stable 10. Type II DM uncontrolled. Continue SSI. 11. Recurrent NHL: Outpatient followup with oncology  Code Status:  Full Family Communication: None at bedside Disposition Plan: Continue treatment in the step down unit.   Consultants:  Urology  Procedures:  None  Antibiotics:  IV Rocephin 3/16 >   Subjective: Patient states that she feels better. Prior to period denies pain. Denies chest pain or dyspnea. No palpitations.  Objective: Filed Vitals:   06/27/13 0300 06/27/13 0400 06/27/13 0500 06/27/13 0600  BP: 98/52 96/40 104/39 87/51  Pulse: 70 90 96 104  Temp:  101 F (38.3 C)    TempSrc:  Oral    Resp: 28 25 29 28   Height:      Weight:      SpO2: 97% 98% 98% 98%    Intake/Output Summary (Last 24 hours) at 06/27/13 9323 Last data filed at 06/27/13 0600  Gross per 24 hour  Intake 1075.83 ml  Output      0 ml  Net 1075.83 ml   Filed Weights   06/26/13 2300  Weight: 103.6 kg (228 lb 6.3 oz)     Exam:  General exam: Elderly female lying comfortably in bed. Does not look septic or toxic. Respiratory system: Clear. No increased work of breathing. Cardiovascular system: S1 & S2 heard, irregularly irregular and mildly tachycardic. No JVD, murmurs, gallops, clicks or pedal edema. Telemetry: A. fib with RVR in the 120s-140s on admission which had improved to 90s-100 this morning. Gastrointestinal system: Abdomen is nondistended, soft and nontender. Normal bowel sounds heard. Central nervous system: Alert and oriented. No focal neurological deficits. Extremities: Symmetric 5 x 5 power.   Data Reviewed: Basic Metabolic Panel:  Recent Labs Lab 06/23/13 1105 06/26/13 1635  NA 141 137  K 4.3 4.4  CL 99 95*  CO2 29 25  GLUCOSE 113* 184*  BUN 26* 25*  CREATININE 1.76* 1.95*  CALCIUM 10.9* 10.1   Liver Function Tests:  Recent Labs Lab 06/26/13 1635  AST 17  ALT 11  ALKPHOS 54  BILITOT 0.4  PROT 6.7  ALBUMIN 3.5   No results found for this basename: LIPASE, AMYLASE,  in the last 168 hours No results found for this basename: AMMONIA,  in the last 168  hours CBC:  Recent Labs Lab 06/23/13 1025 06/26/13 1635  WBC 4.0 11.4*  NEUTROABS  --  9.3*  HGB 11.3* 11.7*  HCT 36.1 36.9  MCV 96.5 95.6  PLT 241 185   Cardiac Enzymes:  Recent Labs Lab 06/26/13 1635  TROPONINI <0.30   BNP (last 3 results) No results found for this basename: PROBNP,  in the last 8760 hours CBG:  Recent Labs Lab 06/26/13 2150 06/26/13 2355 06/27/13 0353  GLUCAP 206* 129* 209*    Recent Results (from the past 240 hour(s))  MRSA PCR SCREENING     Status: None   Collection Time    06/26/13 11:19 PM      Result Value Ref Range Status   MRSA by PCR NEGATIVE  NEGATIVE Final   Comment:            The GeneXpert MRSA Assay (FDA     approved for NASAL specimens     only), is one component of a     comprehensive MRSA colonization     surveillance program. It is not     intended to diagnose MRSA     infection nor to guide or     monitor treatment for     MRSA infections.       Studies: Dg Chest 2 View  06/26/2013   CLINICAL DATA:  Cough, shortness of breath, weakness, history COPD, hypertension, asthma, diabetes, non-Hodgkin's lymphoma  EXAM: CHEST  2 VIEW  COMPARISON:  02/12/2013  FINDINGS: Right subclavian power port with tip projecting over SVC.  Left subclavian transvenous pacemaker leads project at right atrium and right ventricle.  Enlargement of cardiac silhouette with elongation of thoracic aorta.  Mediastinal contours and pulmonary vascularity otherwise normal.  Lungs clear.  No pleural effusion or pneumothorax.  Bones demineralized.  IMPRESSION: Enlargement of cardiac silhouette post pacemaker.  No acute abnormalities.   Electronically Signed   By: Lavonia Dana M.D.   On: 06/26/2013 17:29        Scheduled Meds: . allopurinol  300 mg Oral Daily  . cefTRIAXone (ROCEPHIN)  IV  1 g Intravenous Q24H  . citalopram  20 mg Oral q morning - 10a  . guaiFENesin  600 mg Oral BID  . heparin  5,000 Units Subcutaneous 3 times per day  . insulin  aspart  0-9 Units Subcutaneous 6 times per day  . levothyroxine  50 mcg Oral QAC breakfast  . magnesium oxide  200 mg Oral Daily  . simvastatin  40 mg Oral q1800  . sodium chloride  3 mL Intravenous Q12H  . tiotropium  18 mcg Inhalation Daily   Continuous Infusions: . sodium chloride 125 mL/hr at 06/26/13 2315  . diltiazem (CARDIZEM) infusion 5 mg/hr (06/27/13 0600)    Principal Problem:   Severe sepsis Active Problems:   Essential hypertension, benign   Diffuse large B cell lymphoma   Diabetes mellitus type 2 in obese   Atrial fibrillation with RVR   Hydronephrosis, right   UTI (lower urinary tract infection)   Sepsis secondary to UTI    Time spent: 64 minutes    Audreena Sachdeva, MD, FACP, FHM. [facility] Pager [phone]  If 7PM-7AM, please contact night-coverage [email or web address] Password [facility] 06/27/2013,  7:12 AM    LOS: 1 day

## 2013-06-28 DIAGNOSIS — D638 Anemia in other chronic diseases classified elsewhere: Secondary | ICD-10-CM

## 2013-06-28 DIAGNOSIS — E860 Dehydration: Secondary | ICD-10-CM

## 2013-06-28 DIAGNOSIS — N179 Acute kidney failure, unspecified: Secondary | ICD-10-CM

## 2013-06-28 LAB — BASIC METABOLIC PANEL
BUN: 23 mg/dL (ref 6–23)
CO2: 23 mEq/L (ref 19–32)
CREATININE: 1.87 mg/dL — AB (ref 0.50–1.10)
Calcium: 8.6 mg/dL (ref 8.4–10.5)
Chloride: 99 mEq/L (ref 96–112)
GFR calc non Af Amer: 26 mL/min — ABNORMAL LOW (ref >90)
GFR, EST AFRICAN AMERICAN: 30 mL/min — AB (ref >90)
GLUCOSE: 121 mg/dL — AB (ref 70–99)
Potassium: 3.6 mEq/L — ABNORMAL LOW (ref 3.7–5.3)
Sodium: 135 mEq/L — ABNORMAL LOW (ref 137–147)

## 2013-06-28 LAB — GLUCOSE, CAPILLARY
Glucose-Capillary: 101 mg/dL — ABNORMAL HIGH (ref 70–99)
Glucose-Capillary: 96 mg/dL (ref 70–99)
Glucose-Capillary: 136 mg/dL — ABNORMAL HIGH (ref 70–99)
Glucose-Capillary: 177 mg/dL — ABNORMAL HIGH (ref 70–99)

## 2013-06-28 LAB — CBC
HEMATOCRIT: 29.1 % — AB (ref 36.0–46.0)
HEMOGLOBIN: 9.2 g/dL — AB (ref 12.0–15.0)
MCH: 30.2 pg (ref 26.0–34.0)
MCHC: 31.6 g/dL (ref 30.0–36.0)
MCV: 95.4 fL (ref 78.0–100.0)
Platelets: 122 10*3/uL — ABNORMAL LOW (ref 150–400)
RBC: 3.05 MIL/uL — ABNORMAL LOW (ref 3.87–5.11)
RDW: 15.8 % — AB (ref 11.5–15.5)
WBC: 6.6 10*3/uL (ref 4.0–10.5)

## 2013-06-28 LAB — PROTIME-INR
INR: 1.36 (ref 0.00–1.49)
Prothrombin Time: 16.4 seconds — ABNORMAL HIGH (ref 11.6–15.2)

## 2013-06-28 LAB — HEPARIN LEVEL (UNFRACTIONATED)
HEPARIN UNFRACTIONATED: 0.15 [IU]/mL — AB (ref 0.30–0.70)
HEPARIN UNFRACTIONATED: 0.12 [IU]/mL — AB (ref 0.30–0.70)
Heparin Unfractionated: <0.1 IU/mL — ABNORMAL LOW (ref 0.30–0.70)

## 2013-06-28 MED ORDER — SODIUM CHLORIDE 0.9 % IV BOLUS (SEPSIS)
500.0000 mL | Freq: Once | INTRAVENOUS | Status: AC
  Administered 2013-06-28: 500 mL via INTRAVENOUS

## 2013-06-28 MED ORDER — INSULIN ASPART 100 UNIT/ML ~~LOC~~ SOLN
0.0000 [IU] | Freq: Every day | SUBCUTANEOUS | Status: DC
  Administered 2013-07-01: 2 [IU] via SUBCUTANEOUS

## 2013-06-28 MED ORDER — WARFARIN SODIUM 5 MG PO TABS
5.0000 mg | ORAL_TABLET | Freq: Once | ORAL | Status: AC
  Administered 2013-06-28: 5 mg via ORAL
  Filled 2013-06-28: qty 1

## 2013-06-28 MED ORDER — WARFARIN - PHARMACIST DOSING INPATIENT: Freq: Every day | Status: DC

## 2013-06-28 MED ORDER — DILTIAZEM HCL ER COATED BEADS 180 MG PO CP24
180.0000 mg | ORAL_CAPSULE | Freq: Every day | ORAL | Status: DC
  Administered 2013-06-28 – 2013-07-03 (×6): 180 mg via ORAL
  Filled 2013-06-28 (×6): qty 1

## 2013-06-28 MED ORDER — INSULIN ASPART 100 UNIT/ML ~~LOC~~ SOLN
0.0000 [IU] | Freq: Three times a day (TID) | SUBCUTANEOUS | Status: DC
  Administered 2013-06-28 – 2013-07-01 (×6): 2 [IU] via SUBCUTANEOUS
  Administered 2013-07-01 – 2013-07-02 (×2): 1 [IU] via SUBCUTANEOUS
  Administered 2013-07-02: 2 [IU] via SUBCUTANEOUS
  Administered 2013-07-03 (×2): 1 [IU] via SUBCUTANEOUS

## 2013-06-28 MED ORDER — HEPARIN (PORCINE) IN NACL 100-0.45 UNIT/ML-% IJ SOLN
1900.0000 [IU]/h | INTRAMUSCULAR | Status: DC
  Administered 2013-06-28: 1400 [IU]/h via INTRAVENOUS
  Administered 2013-06-29 – 2013-06-30 (×3): 1900 [IU]/h via INTRAVENOUS
  Filled 2013-06-28 (×6): qty 250

## 2013-06-28 MED ORDER — HEPARIN (PORCINE) IN NACL 100-0.45 UNIT/ML-% IJ SOLN
1200.0000 [IU]/h | INTRAMUSCULAR | Status: DC
  Administered 2013-06-28: 1200 [IU]/h via INTRAVENOUS
  Filled 2013-06-28 (×2): qty 250

## 2013-06-28 NOTE — Progress Notes (Signed)
1 Day Post-Op  Subjective: Rt PCN placed 3/17 Pt is up in chair awaiting breakfast  Objective: Vital signs in last 24 hours: Temp:  [97.7 F (36.5 C)-102.4 F (39.1 C)] 98 F (36.7 C) (03/18 0800) Pulse Rate:  [73-128] 73 (03/18 0800) Resp:  [10-26] 20 (03/18 0800) BP: (80-136)/(43-92) 100/58 mmHg (03/18 0800) SpO2:  [93 %-99 %] 99 % (03/18 0800) Weight:  [106.2 kg (234 lb 2.1 oz)] 106.2 kg (234 lb 2.1 oz) (03/18 0400) Last BM Date: 06/26/13  Intake/Output from previous day: 03/17 0701 - 03/18 0700 In: 2429 [I.V.:2374; IV Piggyback:50] Out: 940 [Urine:940] Intake/Output this shift: Total I/O In: 10 [Other:10] Out: 100 [Urine:100]  PE: afeb; T max at 1000 pm was 102.4 VSS Wbc 6.6; Bun/Cr stable Rt PCN site sl tender No bleeding; clean and dry Output 340 cc yesterday 100 cc today with 50 cc in bag now- blood tinged  Lab Results:   Recent Labs  06/27/13 1402 06/28/13 0030  WBC 8.6 6.6  HGB 10.0* 9.2*  HCT 30.4* 29.1*  PLT 137* 122*   BMET  Recent Labs  06/27/13 1402 06/28/13 0030  NA 138 135*  K 3.9 3.6*  CL 101 99  CO2 23 23  GLUCOSE 147* 121*  BUN 23 23  CREATININE 1.87* 1.87*  CALCIUM 8.8 8.6   PT/INR  Recent Labs  06/26/13 1635 06/28/13 0030  LABPROT 14.4 16.4*  INR 1.14 1.36   ABG No results found for this basename: PHART, PCO2, PO2, HCO3,  in the last 72 hours  Studies/Results: Dg Chest 2 View  06/26/2013   CLINICAL DATA:  Cough, shortness of breath, weakness, history COPD, hypertension, asthma, diabetes, non-Hodgkin's lymphoma  EXAM: CHEST  2 VIEW  COMPARISON:  02/12/2013  FINDINGS: Right subclavian power port with tip projecting over SVC.  Left subclavian transvenous pacemaker leads project at right atrium and right ventricle.  Enlargement of cardiac silhouette with elongation of thoracic aorta.  Mediastinal contours and pulmonary vascularity otherwise normal.  Lungs clear.  No pleural effusion or pneumothorax.  Bones demineralized.   IMPRESSION: Enlargement of cardiac silhouette post pacemaker.  No acute abnormalities.   Electronically Signed   By: Lavonia Dana M.D.   On: 06/26/2013 17:29   Ir Perc Nephrostomy Right  06/27/2013   INDICATION: History of retroperitoneal lymphoma, now with right-sided urinary obstruction and development of urinary sepsis. Please perform percutaneous nephrostomy catheter placement  EXAM: 1. ULTRASOUND GUIDANCE FOR PUNCTURE OF THE RIGHT RENAL COLLECTING SYSTEM 2. RIGHT PERCUTANEOUS NEPHROSTOMY TUBE PLACEMENT.  COMPARISON:  CT ABD/PELVIS W CM dated 06/05/2013  MEDICATIONS: Rocephin 1 g IV; The antibiotic was administered in an appropriate time frame prior to skin puncture.  ANESTHESIA/SEDATION: Fentanyl 25 mcg IV; Versed 1 mg IV  Total Moderate Sedation Time  25 minutes.  CONTRAST:  20 mL Isovue 300 administered into the right collecting system  FLUOROSCOPY TIME:  3 minutes 42 seconds.  COMPLICATIONS: None immediate  PROCEDURE: The procedure, risks, benefits, and alternatives were explained to the patient. Questions regarding the procedure were encouraged and answered. The patient understands and consents to the procedure. [name] timeout was performed prior to the initiation of the procedure.  The right flank region was prepped with Betadine in [name] sterile fashion, and [name] sterile drape was applied covering the operative field. [name] sterile gown and sterile gloves were used for the procedure. Local anesthesia was provided with 1% Lidocaine with epinephrine. Ultrasound was used to localize the right kidney. Under direct ultrasound guidance, [name]  21 gauge needle was advanced into the renal collecting system. An ultrasound image documentation was performed. Access within the collecting system was confirmed with the efflux of urine followed by contrast injection.  Over [name] Nitrex wire, the inner three [place] catheter of an Accustick set was advanced into the renal collecting system. Contrast injection was injected into the collecting  system as several spot radiographs were obtained in various obliquities confirming puncture within [name] posterior inferior calix. As such, the tract was dilated with an Accustick stent. Over [name] guide wire, [name] 10-French percutaneous nephrostomy catheter was advanced into the collecting system where the coil was formed and locked. Contrast was injected and several sport radiographs were obtained in various obliquities confirming access. The catheter was secured at the skin with [name] Prolene retention suture and [name] gravity bag was placed. [name] dressing was placed. The patient tolerated procedure well without immediate postprocedural complication.  FINDINGS: Ultrasound scanning demonstrates [name] moderate dilated right renal collecting system. Under direct ultrasound guidance, [name] posterior inferior calix was targeted allowing advancement of an 10-French percutaneous nephrostomy catheter under intermittent fluoroscopic guidance. Contrast injection confirmed appropriate positioning.  IMPRESSION: Successful ultrasound and fluoroscopic guided placement of [name] right sided 10 French PCN.   Electronically Signed   By: Sandi Mariscal M.D.   On: 06/27/2013 13:22   Ir US Guide Bx Asp/drain  06/27/2013   INDICATION: History of retroperitoneal lymphoma, now with right-sided urinary obstruction and development of urinary sepsis. Please perform percutaneous nephrostomy catheter placement  EXAM: 1. ULTRASOUND GUIDANCE FOR PUNCTURE OF THE RIGHT RENAL COLLECTING SYSTEM 2. RIGHT PERCUTANEOUS NEPHROSTOMY TUBE PLACEMENT.  COMPARISON:  CT ABD/PELVIS W CM dated 06/05/2013  MEDICATIONS: Rocephin 1 g IV; The antibiotic was administered in an appropriate time frame prior to skin puncture.  ANESTHESIA/SEDATION: Fentanyl 25 mcg IV; Versed 1 mg IV  Total Moderate Sedation Time  25 minutes.  CONTRAST:  20 mL Isovue 300 administered into the right collecting system  FLUOROSCOPY TIME:  3 minutes 42 seconds.  COMPLICATIONS: None immediate  PROCEDURE: The procedure,  risks, benefits, and alternatives were explained to the patient. Questions regarding the procedure were encouraged and answered. The patient understands and consents to the procedure. [name] timeout was performed prior to the initiation of the procedure.  The right flank region was prepped with Betadine in [name] sterile fashion, and [name] sterile drape was applied covering the operative field. [name] sterile gown and sterile gloves were used for the procedure. Local anesthesia was provided with 1% Lidocaine with epinephrine. Ultrasound was used to localize the right kidney. Under direct ultrasound guidance, [name] 21 gauge needle was advanced into the renal collecting system. An ultrasound image documentation was performed. Access within the collecting system was confirmed with the efflux of urine followed by contrast injection.  Over [name] Nitrex wire, the inner three [place] catheter of an Accustick set was advanced into the renal collecting system. Contrast injection was injected into the collecting system as several spot radiographs were obtained in various obliquities confirming puncture within [name] posterior inferior calix. As such, the tract was dilated with an Accustick stent. Over [name] guide wire, [name] 10-French percutaneous nephrostomy catheter was advanced into the collecting system where the coil was formed and locked. Contrast was injected and several sport radiographs were obtained in various obliquities confirming access. The catheter was secured at the skin with [name] Prolene retention suture and [name] gravity bag was placed. [name] dressing was placed. The patient tolerated procedure well without immediate postprocedural complication.  FINDINGS:  Ultrasound scanning demonstrates [name] moderate dilated right renal collecting system. Under direct ultrasound guidance, [name] posterior inferior calix was targeted allowing advancement of an 10-French percutaneous nephrostomy catheter under intermittent fluoroscopic guidance. Contrast injection confirmed  appropriate positioning.  IMPRESSION: Successful ultrasound and fluoroscopic guided placement of [name] right sided 10 French PCN.   Electronically Signed   By: Sandi Mariscal M.D.   On: 06/27/2013 13:22    Anti-infectives: Anti-infectives   Start     Dose/Rate Route Frequency Ordered Stop   06/27/13 2045  cefTRIAXone (ROCEPHIN) 1 g in dextrose 5 % 50 mL IVPB     1 g 100 mL/hr over 30 Minutes Intravenous Every 24 hours 06/26/13 2134     06/26/13 2045  cefTRIAXone (ROCEPHIN) 1 g in dextrose 5 % 50 mL IVPB     1 g 100 mL/hr over 30 Minutes Intravenous  Once 06/26/13 2031 06/26/13 2156      Assessment/Plan: s/p Procedure(s): RIGHT RETROGRADE PYELOGRAM RIGHT URETERAL STENT PLACEMENT (Right)  Rt PCN placed 3/17 Plan per Dr Junious Silk   LOS: 2 days    [name] 06/28/2013

## 2013-06-28 NOTE — Progress Notes (Signed)
ANTICOAGULATION CONSULT NOTE - follow up  Pharmacy Consult for IV heparin Indication: Atrial fibrillation  Labs:  Recent Labs  06/26/13 1635 06/27/13 1402 06/28/13 0030 06/28/13 0950 06/28/13 2040  HGB 11.7* 10.0* 9.2*  --   --   HCT 36.9 30.4* 29.1*  --   --   PLT 185 137* 122*  --   --   LABPROT 14.4  --  16.4*  --   --   INR 1.14  --  1.36  --   --   HEPARINUNFRC  --   --  0.12* 0.15* <0.10*  CREATININE 1.95* 1.87* 1.87*  --   --   TROPONINI <0.30  --   --   --   --    Assessment: Heparin level undetectable on 1400units/hr. Confirmed with RN that it is infusing at the correct rate and there have been not issues with IV site.   Goal of Therapy:  Heparin level 0.3-0.7 units/ml Monitor platelets by anticoagulation protocol: Yes   Plan:  Increase heparin to 1900 units/hr.  Recheck HL in ~6 hours(coincides with AM labs).  Romeo Rabon, PharmD, pager [phone]. 06/28/2013,10:03 PM.

## 2013-06-28 NOTE — Addendum Note (Signed)
Encounter addended by: Deirdre Evener, RN on: 06/28/2013 10:39 AM<BR>     Documentation filed: Charges VN

## 2013-06-28 NOTE — Progress Notes (Signed)
[place] for IV heparin / Coumadin Indication: Atrial fibrillation  Allergies  Allergen Reactions  . Codeine Nausea Only  . Penicillins     Unknown    Patient Measurements: Height: 4\' 8"  (142.2 cm) Weight: 234 lb 2.1 oz (106.2 kg) IBW/kg (Calculated) : 36.3 Heparin Dosing Weight: 71kg  Vital Signs: Temp: 98 F (36.7 C) (03/18 0800) Temp src: Oral (03/18 0800) BP: 100/58 mmHg (03/18 0800) Pulse Rate: 73 (03/18 0800)  Labs:  Recent Labs  06/26/13 1635 06/27/13 1402 06/28/13 0030  HGB 11.7* 10.0* 9.2*  HCT 36.9 30.4* 29.1*  PLT 185 137* 122*  LABPROT 14.4  --  16.4*  INR 1.14  --  1.36  HEPARINUNFRC  --   --  0.12*  CREATININE 1.95* 1.87* 1.87*  TROPONINI <0.30  --   --     Estimated Creatinine Clearance: 27.6 ml/min (by C-G formula based on Cr of 1.87).   Medical History: Past Medical History  Diagnosis Date  . Sick sinus syndrome   . Hematoma     At the site of the pacemaker insertion.  . Hypotension   . Dyslipidemia   . History of atrial fibrillation   . Arthritis     Osteoarthritis  . Port-a-cath in place 12/08/10  . Status post chemotherapy completed 03/2011    R - [facility] q 3 weeks x 6  . S/P radiation therapy 05/28/2011 - 06/26/2011    Right Abdomen and Right Pelvis/3060 cGy in 17 Fractions  . Hypertension   . ASHD (arteriosclerotic heart disease)   . Gout   . COPD (chronic obstructive pulmonary disease)   . Asthma   . Hyperlipidemia   . Diabetes mellitus   . Hypothyroidism   . Diffuse large B cell lymphoma     Dr. Chisom,oncology  . Cancer 10/2010    large B cell lymphoma  . 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 Pacemaker     left chest  . Dysrhythmia     Hx. A. Fib, sick sinus syndrome- Pacemaker inserted.  . Cataracts, bilateral    Assessment: 68 yoF with PMHx DLBCL, DM, Afib on chronic coumadin, and hydronephrosis presents with severe sepsis 2/2  complicated UTI/acute pyelonephritis, Afib, and A/CKD. Placed on CTX pending culture results. Pt had planned stent placement 3/17 for obstructive hydronephrosis, however cancelled due to not feeling well. After admitted to [facility], procedure scheduled inpt however pt unstable for anesthesia, therefore performed via IR with perc placement. Pharmacy consulted to dose IV heparin / Warfarin bridge post-op per pharmacy, heparin to start ~1800, warfarin to start 3/18. Per Dr. Pascal Lux (IR), uncomplicated procedure.   3/18:  Remains in Afib. Cardizem gtt changed to home dose PO diltiazem CD 180mg  qday.  CBC: Hgb small decrease to 9.2, plts trending down 122.  No bleeding noted by RN.   Heparin:  No issues with heparin infusion per RN, running at 1200 units/hr (=12 ml/hr).  10AM Heparin level = 0.15 subtherapeutic (goal 0.3-0.7)  Coumadin: LD coumadin 3/13. INR 1.36 as expected. Home dose coumadin 5mg  daily except 2.5mg  T/R.     Goal of Therapy:  Heparin level 0.3-0.7 units/ml Monitor platelets by anticoagulation protocol: Yes INR goal 2-3   Plan: Coumadin 5mg  po x 1 tonight. Increase heparin to 1400 units/hr, recheck HL at 2000.  Daily PT/INR, heparin level  Ralene Bathe, PharmD, BCPS 06/28/2013, 10:42 AM  Pager: [phone]

## 2013-06-28 NOTE — Progress Notes (Signed)
PROGRESS NOTE    [name] [id number] DOB: [birth year] DOA: 06/26/2013 PCP: [name], MD Primary cardiologist: Dr. Cristopher Peru Primary oncologist: Dr. Concha Norway  HPI/Brief narrative 73 year old female with history of sick sinus syndrome, status post PPM, dyslipidemia, PAF on chronic Coumadin, hypertension, COPD, type II DM, hyperlipidemia, hypothyroid, recurrent NHL, with obstructive right hydronephrosis secondary to lymphoma, was planned for elective cystoscopy and right ureteral stent on 3/17 but admitted on 3/16 with sepsis likely secondary to UTI/pyelonephritis, delirium and rapid A. fib. He was admitted to step down unit. Since patient not stable for ureteral stent, urology recommending right percutaneous nephrostomy by IR. Coumadin had been held in case she would need a nephrostomy and not for cystoscopy/stent.   Assessment/Plan:  1. Severe sepsis secondary to complicated UTI/acute pyelonephritis: Treated with IV fluids and IV Rocephin pending urine culture results. IV fluids discontinued. 2. Obstructive right hydronephrosis: Secondary to recurrent NHL. Status post right percutaneous nephrostomy on 3/17. Creatinine better than on admission but plateaued. 3. A. fib with RVR: Patient was initially treated with IV Cardizem drip. Rate is adequately controlled. Will transition to oral Cardizem on 3/18 and resume Coumadin with IV heparin bridging. 4. Acute on chronic kidney disease: Creatinine in January was in the 1.3 range. Now likely worsened by sepsis and hydronephrosis. Improving post hydration and right nephrostomy. Trend daily BMP. 5. Delirium: Likely secondary to infection. Resolved. 6. Chronic anemia: Stable. Transfuse if hemoglobin less than 7 g per DL. 7. Status post PPM 8. Hypertension: Patient running soft blood pressures. 9. COPD: Stable 10. Type II DM uncontrolled. Continue SSI. 11. Recurrent NHL: Oncology input appreciated.  Code Status:  Full Family Communication: None at bedside Disposition Plan: Transfer to telemetry   Consultants:  Urology  Interventional radiology  Medical oncology  Procedures:  Percutaneous right nephrostomy  Antibiotics:  IV Rocephin 3/16 >   Subjective: Overall feels tired but no specific complaints. Denied dyspnea.  Objective: Filed Vitals:   06/28/13 1300 06/28/13 1400 06/28/13 1500 06/28/13 1700  BP:      Pulse: 91 85 101   Temp:    98.8 F (37.1 C)  TempSrc:    Oral  Resp: 21 22 18    Height:      Weight:      SpO2: 92% 92% 94%    blood pressure 100/58  Intake/Output Summary (Last 24 hours) at 06/28/13 1739 Last data filed at 06/28/13 1700  Gross per 24 hour  Intake   2032 ml  Output   1265 ml  Net    767 ml   Filed Weights   06/26/13 2300 06/28/13 0400  Weight: 103.6 kg (228 lb 6.3 oz) 106.2 kg (234 lb 2.1 oz)     Exam:  General exam: Elderly female seen sitting comfortably on chair this morning. Does not look septic or toxic. Respiratory system: Clear. No increased work of breathing. Cardiovascular system: S1 & S2 heard, irregularly irregular and mildly tachycardic. No JVD, murmurs, gallops, clicks or pedal edema. Telemetry: A. fib with controlled ventricular rate in the 80s and intermittently paced rhythm. Gastrointestinal system: Abdomen is nondistended, soft and nontender. Normal bowel sounds heard. Right percutaneous nephrostomy draining straw-colored urine. Central nervous system: Alert and oriented. No focal neurological deficits. Extremities: Symmetric 5 x 5 power.   Data Reviewed: Basic Metabolic Panel:  Recent Labs Lab 06/23/13 1105 06/26/13 1635 06/27/13 1402 06/28/13 0030  NA 141 137 138 135*  K 4.3 4.4 3.9 3.6*  CL 99 95* 101 99  CO2 29 25 23 23   GLUCOSE 113* 184* 147* 121*  BUN 26* 25* 23 23  CREATININE 1.76* 1.95* 1.87* 1.87*  CALCIUM 10.9* 10.1 8.8 8.6   Liver Function Tests:  Recent Labs Lab 06/26/13 1635  AST 17  ALT 11   ALKPHOS 54  BILITOT 0.4  PROT 6.7  ALBUMIN 3.5   No results found for this basename: LIPASE, AMYLASE,  in the last 168 hours No results found for this basename: AMMONIA,  in the last 168 hours CBC:  Recent Labs Lab 06/23/13 1025 06/26/13 1635 06/27/13 1402 06/28/13 0030  WBC 4.0 11.4* 8.6 6.6  NEUTROABS  --  9.3*  --   --   HGB 11.3* 11.7* 10.0* 9.2*  HCT 36.1 36.9 30.4* 29.1*  MCV 96.5 95.6 94.4 95.4  PLT 241 185 137* 122*   Cardiac Enzymes:  Recent Labs Lab 06/26/13 1635  TROPONINI <0.30   BNP (last 3 results) No results found for this basename: PROBNP,  in the last 8760 hours CBG:  Recent Labs Lab 06/27/13 2253 06/28/13 0332 06/28/13 0737 06/28/13 1140 06/28/13 1612  GLUCAP 121* 101* 96 136* 177*    Recent Results (from the past 240 hour(s))  URINE CULTURE     Status: None   Collection Time    06/26/13  6:23 PM      Result Value Ref Range Status   Specimen Description URINE, CATHETERIZED   Final   Special Requests NONE   Final   Culture  Setup Time     Final   Value: 06/27/2013 00:13     Performed at [facility] PENDING   Incomplete   Culture     Final   Value: Culture reincubated for better growth     Performed at [facility]   Report Status PENDING   Incomplete  MRSA PCR SCREENING     Status: None   Collection Time    06/26/13 11:19 PM      Result Value Ref Range Status   MRSA by PCR NEGATIVE  NEGATIVE Final   Comment:            The GeneXpert MRSA Assay (FDA     approved for NASAL specimens     only), is one component of a     comprehensive MRSA colonization     surveillance program. It is not     intended to diagnose MRSA     infection nor to guide or     monitor treatment for     MRSA infections.       Studies: Ir Perc Nephrostomy Right  06/27/2013   INDICATION: History of retroperitoneal lymphoma, now with right-sided urinary obstruction and development of urinary sepsis. Please perform  percutaneous nephrostomy catheter placement  EXAM: 1. ULTRASOUND GUIDANCE FOR PUNCTURE OF THE RIGHT RENAL COLLECTING SYSTEM 2. RIGHT PERCUTANEOUS NEPHROSTOMY TUBE PLACEMENT.  COMPARISON:  CT ABD/PELVIS W CM dated 06/05/2013  MEDICATIONS: Rocephin 1 g IV; The antibiotic was administered in an appropriate time frame prior to skin puncture.  ANESTHESIA/SEDATION: Fentanyl 25 mcg IV; Versed 1 mg IV  Total Moderate Sedation Time  25 minutes.  CONTRAST:  20 mL Isovue 300 administered into the right collecting system  FLUOROSCOPY TIME:  3 minutes 42 seconds.  COMPLICATIONS: None immediate  PROCEDURE: The procedure, risks, benefits, and alternatives were explained to the patient. Questions regarding the procedure were encouraged and answered. The patient understands and consents to the procedure. A timeout  was performed prior to the initiation of the procedure.  The right flank region was prepped with Betadine in a sterile fashion, and a sterile drape was applied covering the operative field. A sterile gown and sterile gloves were used for the procedure. Local anesthesia was provided with 1% Lidocaine with epinephrine. Ultrasound was used to localize the right kidney. Under direct ultrasound guidance, a 21 gauge needle was advanced into the renal collecting system. An ultrasound image documentation was performed. Access within the collecting system was confirmed with the efflux of urine followed by contrast injection.  Over a Nitrex wire, the inner three [place] catheter of an Accustick set was advanced into the renal collecting system. Contrast injection was injected into the collecting system as several spot radiographs were obtained in various obliquities confirming puncture within a posterior inferior calix. As such, the tract was dilated with an Accustick stent. Over a guide wire, a 10-French percutaneous nephrostomy catheter was advanced into the collecting system where the coil was formed and locked. Contrast was  injected and several sport radiographs were obtained in various obliquities confirming access. The catheter was secured at the skin with a Prolene retention suture and a gravity bag was placed. A dressing was placed. The patient tolerated procedure well without immediate postprocedural complication.  FINDINGS: Ultrasound scanning demonstrates a moderate dilated right renal collecting system. Under direct ultrasound guidance, a posterior inferior calix was targeted allowing advancement of an 10-French percutaneous nephrostomy catheter under intermittent fluoroscopic guidance. Contrast injection confirmed appropriate positioning.  IMPRESSION: Successful ultrasound and fluoroscopic guided placement of a right sided 10 French PCN.   Electronically Signed   By: Sandi Mariscal M.D.   On: 06/27/2013 13:22   Ir US Guide Bx Asp/drain  06/27/2013   INDICATION: History of retroperitoneal lymphoma, now with right-sided urinary obstruction and development of urinary sepsis. Please perform percutaneous nephrostomy catheter placement  EXAM: 1. ULTRASOUND GUIDANCE FOR PUNCTURE OF THE RIGHT RENAL COLLECTING SYSTEM 2. RIGHT PERCUTANEOUS NEPHROSTOMY TUBE PLACEMENT.  COMPARISON:  CT ABD/PELVIS W CM dated 06/05/2013  MEDICATIONS: Rocephin 1 g IV; The antibiotic was administered in an appropriate time frame prior to skin puncture.  ANESTHESIA/SEDATION: Fentanyl 25 mcg IV; Versed 1 mg IV  Total Moderate Sedation Time  25 minutes.  CONTRAST:  20 mL Isovue 300 administered into the right collecting system  FLUOROSCOPY TIME:  3 minutes 42 seconds.  COMPLICATIONS: None immediate  PROCEDURE: The procedure, risks, benefits, and alternatives were explained to the patient. Questions regarding the procedure were encouraged and answered. The patient understands and consents to the procedure. A timeout was performed prior to the initiation of the procedure.  The right flank region was prepped with Betadine in a sterile fashion, and a sterile drape  was applied covering the operative field. A sterile gown and sterile gloves were used for the procedure. Local anesthesia was provided with 1% Lidocaine with epinephrine. Ultrasound was used to localize the right kidney. Under direct ultrasound guidance, a 21 gauge needle was advanced into the renal collecting system. An ultrasound image documentation was performed. Access within the collecting system was confirmed with the efflux of urine followed by contrast injection.  Over a Nitrex wire, the inner three [place] catheter of an Accustick set was advanced into the renal collecting system. Contrast injection was injected into the collecting system as several spot radiographs were obtained in various obliquities confirming puncture within a posterior inferior calix. As such, the tract was dilated with an Accustick stent. Over a guide wire,  a 10-French percutaneous nephrostomy catheter was advanced into the collecting system where the coil was formed and locked. Contrast was injected and several sport radiographs were obtained in various obliquities confirming access. The catheter was secured at the skin with a Prolene retention suture and a gravity bag was placed. A dressing was placed. The patient tolerated procedure well without immediate postprocedural complication.  FINDINGS: Ultrasound scanning demonstrates a moderate dilated right renal collecting system. Under direct ultrasound guidance, a posterior inferior calix was targeted allowing advancement of an 10-French percutaneous nephrostomy catheter under intermittent fluoroscopic guidance. Contrast injection confirmed appropriate positioning.  IMPRESSION: Successful ultrasound and fluoroscopic guided placement of a right sided 10 French PCN.   Electronically Signed   By: Sandi Mariscal M.D.   On: 06/27/2013 13:22        Scheduled Meds: . allopurinol  300 mg Oral Daily  . atorvastatin  20 mg Oral q1800  . cefTRIAXone (ROCEPHIN)  IV  1 g Intravenous Q24H  .  citalopram  20 mg Oral q morning - 10a  . diltiazem  180 mg Oral Daily  . guaiFENesin  600 mg Oral BID  . insulin aspart  0-5 Units Subcutaneous QHS  . insulin aspart  0-9 Units Subcutaneous TID WC  . levothyroxine  50 mcg Oral QAC breakfast  . magnesium oxide  200 mg Oral Daily  . sodium chloride  3 mL Intravenous Q12H  . tiotropium  18 mcg Inhalation Daily  . Warfarin - Pharmacist Dosing Inpatient   Does not apply q1800   Continuous Infusions: . heparin 1,400 Units/hr (06/28/13 1211)    Principal Problem:   Severe sepsis Active Problems:   Essential hypertension, benign   Diffuse large B cell lymphoma   Diabetes mellitus type 2 in obese   Atrial fibrillation with RVR   Hydronephrosis, right   UTI (lower urinary tract infection)   Sepsis secondary to UTI    Time spent: 68 minutes    Cezar Misiaszek, MD, FACP, FHM. [facility] Pager [phone]  If 7PM-7AM, please contact night-coverage [email or web address] Password [email or web address] 06/28/2013, 5:39 PM    LOS: 2 days

## 2013-06-28 NOTE — Progress Notes (Signed)
[name]   DOB:January 08, 1941   LG#:[id number]   YCX#:[id number]  Subjective: Chart reviewed.  No acute overnight events noted.  Patient sitting in chair today and reports feeling better overall.  She remains in stepdown on Cardizem gtt and rocephin.    Objective:  Filed Vitals:   06/28/13 1200  BP:   Pulse: 88  Temp: 98.2 F (36.8 C)  Resp: 25    Body mass index is 52.52 kg/(m^2).  Intake/Output Summary (Last 24 hours) at 06/28/13 1333 Last data filed at 06/28/13 4970  Gross per 24 hour  Intake   2422 ml  Output   1040 ml  Net   1382 ml    Sitting in chair; non-toxic appearing.   Sclerae unicteric  Oropharynx clear  Lungs clear -- no rales or rhonchi  Heart irregular irregular  Abdomen benign, obese  MSK no peripheral edema  Neuro nonfocal   CBG (last 3)   Recent Labs  06/28/13 0332 06/28/13 0737 06/28/13 1140  GLUCAP 101* 96 136*     Labs:  Lab Results  Component Value Date   WBC 6.6 06/28/2013   HGB 9.2* 06/28/2013   HCT 29.1* 06/28/2013   MCV 95.4 06/28/2013   PLT 122* 06/28/2013   NEUTROABS 9.3* 2/63/7858   Basic Metabolic Panel:  Recent Labs Lab 06/23/13 1105 06/26/13 1635 06/27/13 1402 06/28/13 0030  NA 141 137 138 135*  K 4.3 4.4 3.9 3.6*  CL 99 95* 101 99  CO2 29 25 23 23   GLUCOSE 113* 184* 147* 121*  BUN 26* 25* 23 23  CREATININE 1.76* 1.95* 1.87* 1.87*  CALCIUM 10.9* 10.1 8.8 8.6   GFR Estimated Creatinine Clearance: 27.6 ml/min (by C-G formula based on Cr of 1.87). Liver Function Tests:  Recent Labs Lab 06/26/13 1635  AST 17  ALT 11  ALKPHOS 54  BILITOT 0.4  PROT 6.7  ALBUMIN 3.5   No results found for this basename: LIPASE, AMYLASE,  in the last 168 hours No results found for this basename: AMMONIA,  in the last 168 hours Coagulation profile  Recent Labs Lab 06/23/13 1025 06/26/13 1635 06/28/13 0030  INR 1.36 1.14 1.36    CBC:  Recent Labs Lab 06/23/13 1025 06/26/13 1635 06/27/13 1402 06/28/13 0030  WBC 4.0  11.4* 8.6 6.6  NEUTROABS  --  9.3*  --   --   HGB 11.3* 11.7* 10.0* 9.2*  HCT 36.1 36.9 30.4* 29.1*  MCV 96.5 95.6 94.4 95.4  PLT 241 185 137* 122*   Cardiac Enzymes:  Recent Labs Lab 06/26/13 1635  TROPONINI <0.30   BNP: No components found with this basename: POCBNP,  CBG:  Recent Labs Lab 06/27/13 2016 06/27/13 2253 06/28/13 0332 06/28/13 0737 06/28/13 1140  GLUCAP 113* 121* 101* 96 136*   Microbiology Recent Results (from the past 240 hour(s))  URINE CULTURE     Status: None   Collection Time    06/26/13  6:23 PM      Result Value Ref Range Status   Specimen Description URINE, CATHETERIZED   Final   Special Requests NONE   Final   Culture  Setup Time     Final   Value: 06/27/2013 00:13     Performed at [place] PENDING   Incomplete   Culture     Final   Value: Culture reincubated for better growth     Performed at [facility]   Report Status PENDING   Incomplete  MRSA PCR SCREENING     Status: None   Collection Time    06/26/13 11:19 PM      Result Value Ref Range Status   MRSA by PCR NEGATIVE  NEGATIVE Final   Comment:            The GeneXpert MRSA Assay (FDA     approved for NASAL specimens     only), is one component of a     comprehensive MRSA colonization     surveillance program. It is not     intended to diagnose MRSA     infection nor to guide or     monitor treatment for     MRSA infections.      Studies:  Dg Chest 2 View  06/26/2013   CLINICAL DATA:  Cough, shortness of breath, weakness, history COPD, hypertension, asthma, diabetes, non-Hodgkin's lymphoma  EXAM: CHEST  2 VIEW  COMPARISON:  02/12/2013  FINDINGS: Right subclavian power port with tip projecting over SVC.  Left subclavian transvenous pacemaker leads project at right atrium and right ventricle.  Enlargement of cardiac silhouette with elongation of thoracic aorta.  Mediastinal contours and pulmonary vascularity otherwise normal.  Lungs clear.   No pleural effusion or pneumothorax.  Bones demineralized.  IMPRESSION: Enlargement of cardiac silhouette post pacemaker.  No acute abnormalities.   Electronically Signed   By: Lavonia Dana M.D.   On: 06/26/2013 17:29   Ir Perc Nephrostomy Right  06/27/2013   INDICATION: History of retroperitoneal lymphoma, now with right-sided urinary obstruction and development of urinary sepsis. Please perform percutaneous nephrostomy catheter placement  EXAM: 1. ULTRASOUND GUIDANCE FOR PUNCTURE OF THE RIGHT RENAL COLLECTING SYSTEM 2. RIGHT PERCUTANEOUS NEPHROSTOMY TUBE PLACEMENT.  COMPARISON:  CT ABD/PELVIS W CM dated 06/05/2013  MEDICATIONS: Rocephin 1 g IV; The antibiotic was administered in an appropriate time frame prior to skin puncture.  ANESTHESIA/SEDATION: Fentanyl 25 mcg IV; Versed 1 mg IV  Total Moderate Sedation Time  25 minutes.  CONTRAST:  20 mL Isovue 300 administered into the right collecting system  FLUOROSCOPY TIME:  3 minutes 42 seconds.  COMPLICATIONS: None immediate  PROCEDURE: The procedure, risks, benefits, and alternatives were explained to the patient. Questions regarding the procedure were encouraged and answered. The patient understands and consents to the procedure. A timeout was performed prior to the initiation of the procedure.  The right flank region was prepped with Betadine in a sterile fashion, and a sterile drape was applied covering the operative field. A sterile gown and sterile gloves were used for the procedure. Local anesthesia was provided with 1% Lidocaine with epinephrine. Ultrasound was used to localize the right kidney. Under direct ultrasound guidance, a 21 gauge needle was advanced into the renal collecting system. An ultrasound image documentation was performed. Access within the collecting system was confirmed with the efflux of urine followed by contrast injection.  Over a Nitrex wire, the inner three [place] catheter of an Accustick set was advanced into the renal collecting  system. Contrast injection was injected into the collecting system as several spot radiographs were obtained in various obliquities confirming puncture within a posterior inferior calix. As such, the tract was dilated with an Accustick stent. Over a guide wire, a 10-French percutaneous nephrostomy catheter was advanced into the collecting system where the coil was formed and locked. Contrast was injected and several sport radiographs were obtained in various obliquities confirming access. The catheter was secured at the skin with a Prolene retention suture and a gravity  bag was placed. A dressing was placed. The patient tolerated procedure well without immediate postprocedural complication.  FINDINGS: Ultrasound scanning demonstrates a moderate dilated right renal collecting system. Under direct ultrasound guidance, a posterior inferior calix was targeted allowing advancement of an 10-French percutaneous nephrostomy catheter under intermittent fluoroscopic guidance. Contrast injection confirmed appropriate positioning.  IMPRESSION: Successful ultrasound and fluoroscopic guided placement of a right sided 10 French PCN.   Electronically Signed   By: Sandi Mariscal M.D.   On: 06/27/2013 13:22   Ir US Guide Bx Asp/drain  06/27/2013   INDICATION: History of retroperitoneal lymphoma, now with right-sided urinary obstruction and development of urinary sepsis. Please perform percutaneous nephrostomy catheter placement  EXAM: 1. ULTRASOUND GUIDANCE FOR PUNCTURE OF THE RIGHT RENAL COLLECTING SYSTEM 2. RIGHT PERCUTANEOUS NEPHROSTOMY TUBE PLACEMENT.  COMPARISON:  CT ABD/PELVIS W CM dated 06/05/2013  MEDICATIONS: Rocephin 1 g IV; The antibiotic was administered in an appropriate time frame prior to skin puncture.  ANESTHESIA/SEDATION: Fentanyl 25 mcg IV; Versed 1 mg IV  Total Moderate Sedation Time  25 minutes.  CONTRAST:  20 mL Isovue 300 administered into the right collecting system  FLUOROSCOPY TIME:  3 minutes 42 seconds.   COMPLICATIONS: None immediate  PROCEDURE: The procedure, risks, benefits, and alternatives were explained to the patient. Questions regarding the procedure were encouraged and answered. The patient understands and consents to the procedure. A timeout was performed prior to the initiation of the procedure.  The right flank region was prepped with Betadine in a sterile fashion, and a sterile drape was applied covering the operative field. A sterile gown and sterile gloves were used for the procedure. Local anesthesia was provided with 1% Lidocaine with epinephrine. Ultrasound was used to localize the right kidney. Under direct ultrasound guidance, a 21 gauge needle was advanced into the renal collecting system. An ultrasound image documentation was performed. Access within the collecting system was confirmed with the efflux of urine followed by contrast injection.  Over a Nitrex wire, the inner three [place] catheter of an Accustick set was advanced into the renal collecting system. Contrast injection was injected into the collecting system as several spot radiographs were obtained in various obliquities confirming puncture within a posterior inferior calix. As such, the tract was dilated with an Accustick stent. Over a guide wire, a 10-French percutaneous nephrostomy catheter was advanced into the collecting system where the coil was formed and locked. Contrast was injected and several sport radiographs were obtained in various obliquities confirming access. The catheter was secured at the skin with a Prolene retention suture and a gravity bag was placed. A dressing was placed. The patient tolerated procedure well without immediate postprocedural complication.  FINDINGS: Ultrasound scanning demonstrates a moderate dilated right renal collecting system. Under direct ultrasound guidance, a posterior inferior calix was targeted allowing advancement of an 10-French percutaneous nephrostomy catheter under intermittent  fluoroscopic guidance. Contrast injection confirmed appropriate positioning.  IMPRESSION: Successful ultrasound and fluoroscopic guided placement of a right sided 10 French PCN.   Electronically Signed   By: Sandi Mariscal M.D.   On: 06/27/2013 13:22    Assessment/Plan: 73 y.o. with the following:  1. Urosepsis, resolving. --Plan per primary team.  Clinically improving. No blood pressure support presently.  Afebrile now. On rocephin and awaiting final cultures.   2. Acute on Chronic Kidney disease w right hydronephrosis secondary #3 plus/minus #1. --S/p R percutaneous nephrostomy by IR on yesterday (3/17).  Draining reddish urine.  Creatinine 1.87 today.  (Range 1.1 to  1.6 over the past one year).  3.  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  -CT Scans of C/A/P (2/23)  consistent with a further moderate increase in retroperitoneal lymphadenopathy complicated by right hydronephrosis. . Based on these scans, she will require further chemotherapy. We discussed treatment with R-ICE, R-CEEP and Rituxan plus revlimid as potential options based on NCCN guidelines.  We will HOLD chemotherapy in the setting of active infection  Or #1.    4. Anemia/ thrombocytopenia  -- She is asymptomatic for anemia.  Please transfuse for symptoms or hbg less than 7.    5. Hypothyroidism: Levothyroxine per primary team  6. Hypertension: Diltiazem gtt per primary team..  7. Diabetes mellitus, type II: SSI per primary team.  8. Hyperlipidemia: Pravastatin per PCP.  9.History of atrial fibrillation: Diltiazem, pacemaker. Coumadin. Was on hold per procedure now on heparin to coumadin bridge.  10. COPD/ Chronic Bronchitis exacerbation.  --Stable.   We will follow this patient with you.    Elicia Lui, MD 06/28/2013  1:33 PM

## 2013-06-28 NOTE — Progress Notes (Signed)
ANTICOAGULATION CONSULT NOTE - F/U Consult  Pharmacy Consult for IV heparin / Coumadin Indication: Atrial fibrillation  Allergies  Allergen Reactions  . Codeine Nausea Only  . Penicillins     Unknown    Patient Measurements: Height: 4\' 8"  (142.2 cm) Weight: 228 lb 6.3 oz (103.6 kg) IBW/kg (Calculated) : 36.3 Heparin Dosing Weight: 71kg  Vital Signs: Temp: 98.4 F (36.9 C) (03/18 0000) Temp src: Oral (03/18 0000) BP: 118/56 mmHg (03/17 2000) Pulse Rate: 102 (03/17 2000)  Labs:  Recent Labs  06/26/13 1635 06/27/13 1402 06/28/13 0030  HGB 11.7* 10.0* 9.2*  HCT 36.9 30.4* 29.1*  PLT 185 137* 122*  LABPROT 14.4  --  16.4*  INR 1.14  --  1.36  HEPARINUNFRC  --   --  0.12*  CREATININE 1.95* 1.87* 1.87*  TROPONINI <0.30  --   --     Estimated Creatinine Clearance: 27.1 ml/min (by C-G formula based on Cr of 1.87).   Medical History: Past Medical History  Diagnosis Date  . Sick sinus syndrome   . Hematoma     At the site of the pacemaker insertion.  . Hypotension   . Dyslipidemia   . History of atrial fibrillation   . Arthritis     Osteoarthritis  . Port-a-cath in place 12/08/10  . Status post chemotherapy completed 03/2011    R - CHOP q 3 weeks x 6  . S/P radiation therapy 05/28/2011 - 06/26/2011    Right Abdomen and Right Pelvis/3060 cGy in 17 Fractions  . Hypertension   . ASHD (arteriosclerotic heart disease)   . Gout   . COPD (chronic obstructive pulmonary disease)   . Asthma   . Hyperlipidemia   . Diabetes mellitus   . Hypothyroidism   . Diffuse large B cell lymphoma     Dr. Chisom,oncology  . Cancer 10/2010    large B cell lymphoma  . 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 Pacemaker     left chest  . Dysrhythmia     Hx. A. Fib, sick sinus syndrome- Pacemaker inserted.  . Cataracts, bilateral    Assessment: 79 yoF with PMHx DLBCL, DM, Afib on chronic coumadin, and hydronephrosis presents with severe  sepsis 2/2 complicated UTI/acute pyelonephritis, Afib, and A/CKD. Placed on CTX pending culture results. Pt had planned stent placement 3/17 for obstructive hydronephrosis, however cancelled due to not feeling well. After admitted to [facility], procedure scheduled inpt however pt unstable for anesthesia, therefore performed via IR with perc placement. Pharmacy consulted to dose IV heparin / Warfarin bridge post-op per pharmacy, heparin to started ~1830, warfarin to start 3/18  3/17:  Currently in Afib. Remains on cardizem gtt. Per Dr. Pascal Lux (IR), uncomplicated procedure.  Finished ~1300.   LD coumadin 3/13. Home dose coumadin 5mg  daily except 2.5mg  T/R.  3/16 INR 1.14, subtherapeutic.  CBC: Hgb 11.7, stable, plts ok.  LD SQ hep this AM at 6AM.  3/18: 1st HL= 0.12, no problems per RN, below goal   Goal of Therapy:  Heparin level 0.3-0.7 units/ml Monitor platelets by anticoagulation protocol: Yes INR goal 2-3   Plan:   Increase heparin drip to 1200 units/hr  Recheck HL @ 1000  Daily HL/CBC   Dorrene German 06/28/2013, 1:32 AM

## 2013-06-28 NOTE — Progress Notes (Signed)
PT Cancellation Note  Patient Details Name: CAYDEE TALKINGTON MRN: [id number] DOB: Oct 01, 1940   Cancelled Treatment:    Reason Eval/Treat Not Completed: Medical issues which prohibited therapyPt. Needs to not be on IV rate control meds to increase activity. Will check on in AM.   Claretha Cooper 06/28/2013, 4:27 PM

## 2013-06-29 LAB — BASIC METABOLIC PANEL
BUN: 25 mg/dL — ABNORMAL HIGH (ref 6–23)
CHLORIDE: 101 meq/L (ref 96–112)
CO2: 22 meq/L (ref 19–32)
CREATININE: 1.92 mg/dL — AB (ref 0.50–1.10)
Calcium: 8.6 mg/dL (ref 8.4–10.5)
GFR calc non Af Amer: 25 mL/min — ABNORMAL LOW (ref >90)
GFR, EST AFRICAN AMERICAN: 29 mL/min — AB (ref >90)
Glucose, Bld: 127 mg/dL — ABNORMAL HIGH (ref 70–99)
Potassium: 3.5 mEq/L — ABNORMAL LOW (ref 3.7–5.3)
SODIUM: 136 meq/L — AB (ref 137–147)

## 2013-06-29 LAB — CBC
HCT: 28 % — ABNORMAL LOW (ref 36.0–46.0)
Hemoglobin: 9.1 g/dL — ABNORMAL LOW (ref 12.0–15.0)
MCH: 30.3 pg (ref 26.0–34.0)
MCHC: 32.5 g/dL (ref 30.0–36.0)
MCV: 93.3 fL (ref 78.0–100.0)
Platelets: 126 10*3/uL — ABNORMAL LOW (ref 150–400)
RBC: 3 MIL/uL — AB (ref 3.87–5.11)
RDW: 15.5 % (ref 11.5–15.5)
WBC: 5.3 10*3/uL (ref 4.0–10.5)

## 2013-06-29 LAB — GLUCOSE, CAPILLARY
GLUCOSE-CAPILLARY: 136 mg/dL — AB (ref 70–99)
GLUCOSE-CAPILLARY: 117 mg/dL — AB (ref 70–99)
GLUCOSE-CAPILLARY: 166 mg/dL — AB (ref 70–99)
Glucose-Capillary: 114 mg/dL — ABNORMAL HIGH (ref 70–99)
Glucose-Capillary: 115 mg/dL — ABNORMAL HIGH (ref 70–99)
Glucose-Capillary: 129 mg/dL — ABNORMAL HIGH (ref 70–99)

## 2013-06-29 LAB — PROTIME-INR
INR: 1.25 (ref 0.00–1.49)
Prothrombin Time: 15.4 seconds — ABNORMAL HIGH (ref 11.6–15.2)

## 2013-06-29 LAB — HEPARIN LEVEL (UNFRACTIONATED)
HEPARIN UNFRACTIONATED: 0.54 [IU]/mL (ref 0.30–0.70)
HEPARIN UNFRACTIONATED: 0.65 [IU]/mL (ref 0.30–0.70)
Heparin Unfractionated: 0.61 IU/mL (ref 0.30–0.70)

## 2013-06-29 MED ORDER — WARFARIN SODIUM 7.5 MG PO TABS
7.5000 mg | ORAL_TABLET | Freq: Once | ORAL | Status: AC
  Administered 2013-06-29: 7.5 mg via ORAL
  Filled 2013-06-29: qty 1

## 2013-06-29 NOTE — Progress Notes (Addendum)
PROGRESS NOTE    [name] OVZ:[id number] DOB: 1940/08/09 DOA: 06/26/2013 PCP: [name], MD Primary cardiologist: Dr. Cristopher Peru Primary oncologist: Dr. Concha Norway  HPI/Brief narrative 73 year old female with history of sick sinus syndrome, status post PPM, dyslipidemia, PAF on chronic Coumadin, hypertension, COPD, type II DM, hyperlipidemia, hypothyroid, recurrent NHL, with obstructive right hydronephrosis secondary to lymphoma, was planned for elective cystoscopy and right ureteral stent on 3/17 but admitted on 3/16 with sepsis likely secondary to UTI/pyelonephritis, delirium and rapid A. fib. He was admitted to step down unit. Since patient not stable for ureteral stent, urology recommending right percutaneous nephrostomy by IR. Coumadin had been held in case she would need a nephrostomy and not for cystoscopy/stent.   Assessment/Plan:  1. Severe sepsis secondary to complicated gram-negative rod UTI/acute pyelonephritis: Treated with IV fluids and IV Rocephin pending urine culture results. Spiked 101.3 this morning, leukocytosis resolved. Clinically looks better. Follow 2. Obstructive right hydronephrosis: Secondary to recurrent NHL. Status post right percutaneous nephrostomy on 3/17. Creatinine better than on admission but plateaued. Urology followup appreciated. 3. A. fib with RVR: Patient was initially treated with IV Cardizem drip and then transitioned to home oral Cardizem on 3/18 and resume Coumadin with IV heparin bridging. Controlled ventricular rate. 4. Acute on chronic kidney disease: Creatinine in January was in the 1.3 range. Now likely worsened by sepsis and hydronephrosis. Improving post hydration and right nephrostomy. Creatinine has plateaued. Trend daily BMP. 5. Delirium: Likely secondary to infection. Resolved. 6. Chronic anemia: Stable. Transfuse if hemoglobin less than 7 g per DL. 7. Status post PPM 8. Hypertension: Patient running soft blood  pressures. 9. COPD: Stable 10. Type II DM uncontrolled. Continue SSI. 11. Recurrent NHL: Oncology input appreciated.  Code Status: Full Family Communication: discussed with patients HCPOA/[name],Doris via phone on 06/29/2013 Disposition Plan: Transfer to telemetry 3/19   Consultants:  Urology  Interventional radiology  Medical oncology  Procedures:  Percutaneous right nephrostomy  Antibiotics:  IV Rocephin 3/16 >   Subjective: Denied complaints. Denied dyspnea or chest pain.  Objective: Filed Vitals:   06/29/13 0435 06/29/13 0800 06/29/13 0905 06/29/13 1454  BP: 92/52  96/50 109/74  Pulse: 84  93 95  Temp:  100.7 F (38.2 C)  100.5 F (38.1 C)  TempSrc:  Axillary  Oral  Resp: 21  20 20   Height:      Weight:      SpO2: 97%  93% 95%   blood pressure 100/58  Intake/Output Summary (Last 24 hours) at 06/29/13 1534 Last data filed at 06/29/13 1400  Gross per 24 hour  Intake    658 ml  Output   2000 ml  Net  -1342 ml   Filed Weights   06/26/13 2300 06/28/13 0400  Weight: 103.6 kg (228 lb 6.3 oz) 106.2 kg (234 lb 2.1 oz)     Exam:  General exam: Elderly female comfortable. Does not look septic or toxic. Respiratory system: Clear. No increased work of breathing. Cardiovascular system: S1 & S2 heard, irregularly irregular and mildly tachycardic. No JVD, murmurs, gallops, clicks or pedal edema. Telemetry: A. fib with controlled ventricular rate in the 80s and intermittently V paced rhythm. Gastrointestinal system: Abdomen is nondistended, soft and nontender. Normal bowel sounds heard. Right percutaneous nephrostomy draining straw-colored urine. Central nervous system: Alert and oriented. No focal neurological deficits. Extremities: Symmetric 5 x 5 power.   Data Reviewed: Basic Metabolic Panel:  Recent Labs Lab 06/23/13 1105 06/26/13 1635 06/27/13 1402 06/28/13 0030  06/29/13 0500  NA 141 137 138 135* 136*  K 4.3 4.4 3.9 3.6* 3.5*  CL 99 95* 101  99 101  CO2 29 25 23 23 22   GLUCOSE 113* 184* 147* 121* 127*  BUN 26* 25* 23 23 25*  CREATININE 1.76* 1.95* 1.87* 1.87* 1.92*  CALCIUM 10.9* 10.1 8.8 8.6 8.6   Liver Function Tests:  Recent Labs Lab 06/26/13 1635  AST 17  ALT 11  ALKPHOS 54  BILITOT 0.4  PROT 6.7  ALBUMIN 3.5   No results found for this basename: LIPASE, AMYLASE,  in the last 168 hours No results found for this basename: AMMONIA,  in the last 168 hours CBC:  Recent Labs Lab 06/23/13 1025 06/26/13 1635 06/27/13 1402 06/28/13 0030 06/29/13 0500  WBC 4.0 11.4* 8.6 6.6 5.3  NEUTROABS  --  9.3*  --   --   --   HGB 11.3* 11.7* 10.0* 9.2* 9.1*  HCT 36.1 36.9 30.4* 29.1* 28.0*  MCV 96.5 95.6 94.4 95.4 93.3  PLT 241 185 137* 122* 126*   Cardiac Enzymes:  Recent Labs Lab 06/26/13 1635  TROPONINI <0.30   BNP (last 3 results) No results found for this basename: PROBNP,  in the last 8760 hours CBG:  Recent Labs Lab 06/28/13 1612 06/28/13 1925 06/28/13 2214 06/29/13 0755 06/29/13 1215  GLUCAP 177* 136* 117* 114* 166*    Recent Results (from the past 240 hour(s))  URINE CULTURE     Status: None   Collection Time    06/26/13  6:23 PM      Result Value Ref Range Status   Specimen Description URINE, CATHETERIZED   Final   Special Requests NONE   Final   Culture  Setup Time     Final   Value: 06/27/2013 00:13     Performed at [facility] Count     Final   Value: >=100,000 COLONIES/ML     Performed at [facility]   Culture     Final   Value: [place]     Performed at [facility]   Report Status PENDING   Incomplete  MRSA PCR SCREENING     Status: None   Collection Time    06/26/13 11:19 PM      Result Value Ref Range Status   MRSA by PCR NEGATIVE  NEGATIVE Final   Comment:            The GeneXpert MRSA Assay (FDA     approved for NASAL specimens     only), is one component of a     comprehensive MRSA colonization     surveillance program.  It is not     intended to diagnose MRSA     infection nor to guide or     monitor treatment for     MRSA infections.       Studies: No results found.      Scheduled Meds: . allopurinol  300 mg Oral Daily  . atorvastatin  20 mg Oral q1800  . cefTRIAXone (ROCEPHIN)  IV  1 g Intravenous Q24H  . citalopram  20 mg Oral q morning - 10a  . diltiazem  180 mg Oral Daily  . guaiFENesin  600 mg Oral BID  . insulin aspart  0-5 Units Subcutaneous QHS  . insulin aspart  0-9 Units Subcutaneous TID WC  . levothyroxine  50 mcg Oral QAC breakfast  . magnesium oxide  200 mg  Oral Daily  . sodium chloride  3 mL Intravenous Q12H  . tiotropium  18 mcg Inhalation Daily  . warfarin  7.5 mg Oral ONCE-1800  . Warfarin - Pharmacist Dosing Inpatient   Does not apply q1800   Continuous Infusions: . heparin 1,900 Units/hr (06/29/13 0636)    Principal Problem:   Severe sepsis Active Problems:   Essential hypertension, benign   Diffuse large B cell lymphoma   Diabetes mellitus type 2 in obese   Atrial fibrillation with RVR   Hydronephrosis, right   UTI (lower urinary tract infection)   Sepsis secondary to UTI    Time spent: 7 minutes    Jacquilyn Seldon, MD, FACP, FHM. [facility] Pager [phone]  If 7PM-7AM, please contact night-coverage [email or web address] Password [email or web address] 06/29/2013, 3:34 PM    LOS: 3 days

## 2013-06-29 NOTE — Evaluation (Signed)
Physical Therapy Evaluation Patient Details Name: [name] MRN: [id number] DOB: 07/11/1940 Today's Date: 06/29/2013 Time: 8295-6213 PT Time Calculation (min): 13 min  PT Assessment / Plan / Recommendation History of Present Illness  Pt is a 73 year old female with PMHx of sick sinus syndrome, s/p PPM, a fib on chronic Coumadin, hypertension, COPD, DM II, recurrent NHL, with obstructive right hydronephrosis secondary to lymphoma, was planned for elective cystoscopy and right ureteral stent on 3/17 but admitted on 3/16 with sepsis likely secondary to UTI/pyelonephritis, delirium and rapid a fib.   Clinical Impression  Pt currently with functional limitations due to the deficits listed below (see PT Problem List).  Pt will benefit from skilled PT to increase their independence and safety with mobility to allow discharge to the venue listed below.  Pt only agreeable to transfers today due to fatigue and abdominal pain.  Pt reports scooter mobility at baseline and usually doesn't ambulate due to "bad knees."  Pt reports she plans to d/c to rehab.     PT Assessment  Patient needs continued PT services    Follow Up Recommendations  SNF    Does the patient have the potential to tolerate intense rehabilitation      Barriers to Discharge        Equipment Recommendations  None recommended by PT    Recommendations for Other Services     Frequency Min 3X/week    Precautions / Restrictions Precautions Precautions: Fall Precaution Comments: Rt PCN placed 3/17   Pertinent Vitals/Pain C/o abdominal pain, not rated, RN present in room       Mobility  Bed Mobility General bed mobility comments: pt up on [facility] with RN upon entering Transfers Overall transfer level: Needs assistance Equipment used: Rolling walker (2 wheeled) Transfers: Sit to/from [facility] Sit to Stand: Min guard;+2 safety/equipment Stand pivot transfers: Min guard;+2 safety/equipment General  transfer comment: pt reports fearful of falling and felt unable to ambulate today however also does not ambulate much at baseline, performed 2 sit to stands and then stand pivot from [facility] to recliner, multiple lines/leads, Rt PCN    Exercises     PT Diagnosis: Generalized weakness  PT Problem List: Decreased strength;Decreased activity tolerance;Decreased mobility PT Treatment Interventions:       PT Goals(Current goals can be found in the care plan section) Acute Rehab PT Goals PT Goal Formulation: With patient Time For Goal Achievement: 07/06/13 Potential to Achieve Goals: Good  Visit Information  Last PT Received On: 06/29/13 Assistance Needed: +2 (if ambulating, pt fearful of falling) History of Present Illness: Pt is a 73 year old female with PMHx of sick sinus syndrome, s/p PPM, a fib on chronic Coumadin, hypertension, COPD, DM II, recurrent NHL, with obstructive right hydronephrosis secondary to lymphoma, was planned for elective cystoscopy and right ureteral stent on 3/17 but admitted on 3/16 with sepsis likely secondary to UTI/pyelonephritis, delirium and rapid a fib.        Prior [place] expects to be discharged to:: Skilled nursing facility Living Arrangements: Alone Prior Function Level of Independence: Independent with assistive device(s) Comments: pt reports she mostly uses scooter at home Communication Communication: No difficulties    Cognition  Cognition Arousal/Alertness: Awake/alert Behavior During Therapy: WFL for tasks assessed/performed Overall Cognitive Status: Within Functional Limits for tasks assessed    Extremity/Trunk Assessment Lower Extremity Assessment Lower Extremity Assessment: Generalized weakness   Balance    End of Session PT - End of Session Activity Tolerance:  Patient limited by fatigue;Patient limited by pain Patient left: in chair;with call bell/phone within reach  GP     [facility]  E 06/29/2013, 4:03 PM Carmelia Bake, PT, DPT 06/29/2013 Pager: [phone]

## 2013-06-29 NOTE — ED Provider Notes (Signed)
Medical screening examination/treatment/procedure(s) were performed by non-physician practitioner and as supervising physician I was immediately available for consultation/collaboration.   EKG Interpretation   Date/Time:  Monday June 26 2013 16:39:30 EDT Ventricular Rate:  142 PR Interval:  115 QRS Duration: 125 QT Interval:  341 QTC Calculation: 524 R Axis:   80 Text Interpretation:  Pacemaker spikes or artifacts Sinus tachycardia  Ventricular premature complex Right bundle branch block ED PHYSICIAN  INTERPRETATION AVAILABLE IN [place] Confirmed by TEST, Record  ([id number]) on 06/28/2013 7:21:51 AM        Orpah Greek, MD 06/29/13 [phone]

## 2013-06-29 NOTE — Progress Notes (Signed)
Patient was transported to 4E in stable condition. Report was given to RN prior to transfer. Two nieces are with patient.

## 2013-06-29 NOTE — Progress Notes (Signed)
2 Days Post-Op  Subjective: Rt PCN placed 3/17 Pt is up in chair awaiting breakfast  Objective: Vital signs in last 24 hours: Temp:  [98.2 F (36.8 C)-101.3 F (38.5 C)] 101.3 F (38.5 C) (03/19 0353) Pulse Rate:  [84-101] 93 (03/19 0905) Resp:  [16-27] 20 (03/19 0905) BP: (92-137)/(50-87) 96/50 mmHg (03/19 0905) SpO2:  [91 %-97 %] 93 % (03/19 0905) Last BM Date: 06/26/13  Febrile this am. Rt PCN site sl tender No bleeding; clean and dry Output 675 cc yesterday About 150cc in bag now, clear pale pink urine  Lab Results:   Recent Labs  06/28/13 0030 06/29/13 0500  WBC 6.6 5.3  HGB 9.2* 9.1*  HCT 29.1* 28.0*  PLT 122* 126*   BMET  Recent Labs  06/28/13 0030 06/29/13 0500  NA 135* 136*  K 3.6* 3.5*  CL 99 101  CO2 23 22  GLUCOSE 121* 127*  BUN 23 25*  CREATININE 1.87* 1.92*  CALCIUM 8.6 8.6   PT/INR  Recent Labs  06/28/13 0030 06/29/13 0500  LABPROT 16.4* 15.4*  INR 1.36 1.25   ABG No results found for this basename: PHART, PCO2, PO2, HCO3,  in the last 72 hours  Studies/Results: Ir Perc Nephrostomy Right  06/27/2013   INDICATION: History of retroperitoneal lymphoma, now with right-sided urinary obstruction and development of urinary sepsis. Please perform percutaneous nephrostomy catheter placement  EXAM: 1. ULTRASOUND GUIDANCE FOR PUNCTURE OF THE RIGHT RENAL COLLECTING SYSTEM 2. RIGHT PERCUTANEOUS NEPHROSTOMY TUBE PLACEMENT.  COMPARISON:  CT ABD/PELVIS W CM dated 06/05/2013  MEDICATIONS: Rocephin 1 g IV; The antibiotic was administered in an appropriate time frame prior to skin puncture.  ANESTHESIA/SEDATION: Fentanyl 25 mcg IV; Versed 1 mg IV  Total Moderate Sedation Time  25 minutes.  CONTRAST:  20 mL Isovue 300 administered into the right collecting system  FLUOROSCOPY TIME:  3 minutes 42 seconds.  COMPLICATIONS: None immediate  PROCEDURE: The procedure, risks, benefits, and alternatives were explained to the patient. Questions regarding the procedure  were encouraged and answered. The patient understands and consents to the procedure. A timeout was performed prior to the initiation of the procedure.  The right flank region was prepped with Betadine in a sterile fashion, and a sterile drape was applied covering the operative field. A sterile gown and sterile gloves were used for the procedure. Local anesthesia was provided with 1% Lidocaine with epinephrine. Ultrasound was used to localize the right kidney. Under direct ultrasound guidance, a 21 gauge needle was advanced into the renal collecting system. An ultrasound image documentation was performed. Access within the collecting system was confirmed with the efflux of urine followed by contrast injection.  Over a Nitrex wire, the inner three [place] catheter of an Accustick set was advanced into the renal collecting system. Contrast injection was injected into the collecting system as several spot radiographs were obtained in various obliquities confirming puncture within a posterior inferior calix. As such, the tract was dilated with an Accustick stent. Over a guide wire, a 10-French percutaneous nephrostomy catheter was advanced into the collecting system where the coil was formed and locked. Contrast was injected and several sport radiographs were obtained in various obliquities confirming access. The catheter was secured at the skin with a Prolene retention suture and a gravity bag was placed. A dressing was placed. The patient tolerated procedure well without immediate postprocedural complication.  FINDINGS: Ultrasound scanning demonstrates a moderate dilated right renal collecting system. Under direct ultrasound guidance, a posterior inferior calix was  targeted allowing advancement of an 10-French percutaneous nephrostomy catheter under intermittent fluoroscopic guidance. Contrast injection confirmed appropriate positioning.  IMPRESSION: Successful ultrasound and fluoroscopic guided placement of a right  sided 10 French PCN.   Electronically Signed   By: Sandi Mariscal M.D.   On: 06/27/2013 13:22   Ir US Guide Bx Asp/drain  06/27/2013   INDICATION: History of retroperitoneal lymphoma, now with right-sided urinary obstruction and development of urinary sepsis. Please perform percutaneous nephrostomy catheter placement  EXAM: 1. ULTRASOUND GUIDANCE FOR PUNCTURE OF THE RIGHT RENAL COLLECTING SYSTEM 2. RIGHT PERCUTANEOUS NEPHROSTOMY TUBE PLACEMENT.  COMPARISON:  CT ABD/PELVIS W CM dated 06/05/2013  MEDICATIONS: Rocephin 1 g IV; The antibiotic was administered in an appropriate time frame prior to skin puncture.  ANESTHESIA/SEDATION: Fentanyl 25 mcg IV; Versed 1 mg IV  Total Moderate Sedation Time  25 minutes.  CONTRAST:  20 mL Isovue 300 administered into the right collecting system  FLUOROSCOPY TIME:  3 minutes 42 seconds.  COMPLICATIONS: None immediate  PROCEDURE: The procedure, risks, benefits, and alternatives were explained to the patient. Questions regarding the procedure were encouraged and answered. The patient understands and consents to the procedure. A timeout was performed prior to the initiation of the procedure.  The right flank region was prepped with Betadine in a sterile fashion, and a sterile drape was applied covering the operative field. A sterile gown and sterile gloves were used for the procedure. Local anesthesia was provided with 1% Lidocaine with epinephrine. Ultrasound was used to localize the right kidney. Under direct ultrasound guidance, a 21 gauge needle was advanced into the renal collecting system. An ultrasound image documentation was performed. Access within the collecting system was confirmed with the efflux of urine followed by contrast injection.  Over a Nitrex wire, the inner three [place] catheter of an Accustick set was advanced into the renal collecting system. Contrast injection was injected into the collecting system as several spot radiographs were obtained in various  obliquities confirming puncture within a posterior inferior calix. As such, the tract was dilated with an Accustick stent. Over a guide wire, a 10-French percutaneous nephrostomy catheter was advanced into the collecting system where the coil was formed and locked. Contrast was injected and several sport radiographs were obtained in various obliquities confirming access. The catheter was secured at the skin with a Prolene retention suture and a gravity bag was placed. A dressing was placed. The patient tolerated procedure well without immediate postprocedural complication.  FINDINGS: Ultrasound scanning demonstrates a moderate dilated right renal collecting system. Under direct ultrasound guidance, a posterior inferior calix was targeted allowing advancement of an 10-French percutaneous nephrostomy catheter under intermittent fluoroscopic guidance. Contrast injection confirmed appropriate positioning.  IMPRESSION: Successful ultrasound and fluoroscopic guided placement of a right sided 10 French PCN.   Electronically Signed   By: Sandi Mariscal M.D.   On: 06/27/2013 13:22    Anti-infectives: Anti-infectives   Start     Dose/Rate Route Frequency Ordered Stop   06/27/13 2045  cefTRIAXone (ROCEPHIN) 1 g in dextrose 5 % 50 mL IVPB     1 g 100 mL/hr over 30 Minutes Intravenous Every 24 hours 06/26/13 2134     06/26/13 2045  cefTRIAXone (ROCEPHIN) 1 g in dextrose 5 % 50 mL IVPB     1 g 100 mL/hr over 30 Minutes Intravenous  Once 06/26/13 2031 06/26/13 2156      Assessment/Plan: Rt hydronephrosis from NHL lymphadenopathy Rt PCN placed 3/17 Still spiking fevers, 101.3 today WBC  remains normal. Urine Cx=abundant GNR Plan per Dr Junious Silk,  After DC, IR will schedule pt for outpt attempt at internalization to (R)ureteral stent   LOS: 3 days    [name] 06/29/2013

## 2013-06-29 NOTE — Progress Notes (Addendum)
Patient ID: [name], female   DOB: [birth year], 73 y.o.   MRN: [id number] Patient complains of being tired. I spoke to her family members and they report patient is close to baseline (pt has limited mobility at baseline, pt only transfers from her bed, to a toilet, to her scooter).  Filed Vitals:   06/29/13 0905  BP: 96/50  Pulse: 93  Temp:   Resp: 20     PE: NAD Sitting in a chair Rt Nx tube draining clear urine -- good UOP.   CBC    Component Value Date/Time   WBC 5.3 06/29/2013 0500   WBC 3.1* 06/12/2013 1425   RBC 3.00* 06/29/2013 0500   RBC 3.44* 06/12/2013 1425   HGB 9.1* 06/29/2013 0500   HGB 10.6* 06/12/2013 1425   HCT 28.0* 06/29/2013 0500   HCT 32.9* 06/12/2013 1425   PLT 126* 06/29/2013 0500   PLT 281 06/12/2013 1425   MCV 93.3 06/29/2013 0500   MCV 95.6 06/12/2013 1425   MCH 30.3 06/29/2013 0500   MCH 30.7 06/12/2013 1425   MCHC 32.5 06/29/2013 0500   MCHC 32.1 06/12/2013 1425   RDW 15.5 06/29/2013 0500   RDW 17.3* 06/12/2013 1425   LYMPHSABS 0.7 06/26/2013 1635   LYMPHSABS 1.1 06/12/2013 1425   MONOABS 1.5* 06/26/2013 1635   MONOABS 0.6 06/12/2013 1425   EOSABS 0.0 06/26/2013 1635   EOSABS 0.1 06/12/2013 1425   BASOSABS 0.0 06/26/2013 1635   BASOSABS 0.0 06/12/2013 1425    BMET    Component Value Date/Time   NA 136* 06/29/2013 0500   NA 141 06/12/2013 1425   NA 141 10/12/2011 0957   K 3.5* 06/29/2013 0500   K 4.5 06/12/2013 1425   K 4.4 10/12/2011 0957   CL 101 06/29/2013 0500   CL 102 09/21/2012 0917   CL 95* 10/12/2011 0957   CO2 22 06/29/2013 0500   CO2 28 06/12/2013 1425   CO2 29 10/12/2011 0957   GLUCOSE 127* 06/29/2013 0500   GLUCOSE 101 06/12/2013 1425   GLUCOSE 161* 09/21/2012 0917   GLUCOSE 131* 10/12/2011 0957   BUN 25* 06/29/2013 0500   BUN 21.6 06/12/2013 1425   BUN 15 10/12/2011 0957   CREATININE 1.92* 06/29/2013 0500   CREATININE 1.7* 06/12/2013 1425   CREATININE 1.10 04/19/2013 1420   CALCIUM 8.6 06/29/2013 0500   CALCIUM 10.4 06/12/2013 1425   CALCIUM 9.1 10/12/2011 0957   GFRNONAA 25*  06/29/2013 0500   GFRAA 29* 06/29/2013 0500      Imp/plan -  Sepsis, NHL Hydronephrosis - s/p right Nx tube; I ordered internalization for 1-2 weeks. I reviewed Nx / IR images.  ARF - renal fxn stable - excellent UOP

## 2013-06-29 NOTE — Progress Notes (Signed)
[place] for IV heparin / Coumadin Indication: Atrial fibrillation  Allergies  Allergen Reactions  . Codeine Nausea Only  . Penicillins     Unknown    Patient Measurements: Height: 4\' 8"  (142.2 cm) Weight: 234 lb 2.1 oz (106.2 kg) IBW/kg (Calculated) : 36.3 Heparin Dosing Weight: 71kg  Vital Signs: Temp: 101.3 F (38.5 C) (03/19 0353) Temp src: Oral (03/19 0353) BP: 96/50 mmHg (03/19 0905) Pulse Rate: 93 (03/19 0905)  Labs:  Recent Labs  06/26/13 1635 06/27/13 1402  06/28/13 0030 06/28/13 0950 06/28/13 2040 06/29/13 0500  HGB 11.7* 10.0*  --  9.2*  --   --  9.1*  HCT 36.9 30.4*  --  29.1*  --   --  28.0*  PLT 185 137*  --  122*  --   --  126*  LABPROT 14.4  --   --  16.4*  --   --  15.4*  INR 1.14  --   --  1.36  --   --  1.25  HEPARINUNFRC  --   --   < > 0.12* 0.15* <0.10* 0.54  CREATININE 1.95* 1.87*  --  1.87*  --   --  1.92*  TROPONINI <0.30  --   --   --   --   --   --   < > = values in this interval not displayed.  Estimated Creatinine Clearance: 26.9 ml/min (by C-G formula based on Cr of 1.92).   Medical History: Past Medical History  Diagnosis Date  . Sick sinus syndrome   . Hematoma     At the site of the pacemaker insertion.  . Hypotension   . Dyslipidemia   . History of atrial fibrillation   . Arthritis     Osteoarthritis  . Port-a-cath in place 12/08/10  . Status post chemotherapy completed 03/2011    R - CHOP q 3 weeks x 6  . S/P radiation therapy 05/28/2011 - 06/26/2011    Right Abdomen and Right Pelvis/3060 cGy in 17 Fractions  . Hypertension   . ASHD (arteriosclerotic heart disease)   . Gout   . COPD (chronic obstructive pulmonary disease)   . Asthma   . Hyperlipidemia   . Diabetes mellitus   . Hypothyroidism   . Diffuse large B cell lymphoma     Dr. Chisom,oncology  . Cancer 10/2010    large B cell lymphoma  . 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  Pacemaker     left chest  . Dysrhythmia     Hx. A. Fib, sick sinus syndrome- Pacemaker inserted.  . Cataracts, bilateral    Assessment: 26 yoF with PMHx DLBCL, DM, Afib on chronic coumadin, and hydronephrosis presents with severe sepsis 2/2 complicated UTI/acute pyelonephritis, Afib, and A/CKD. Placed on CTX pending culture results. Pt had planned stent placement 3/17 for obstructive hydronephrosis, however cancelled due to not feeling well. After admitted to [facility], procedure scheduled inpt however pt unstable for anesthesia, therefore performed via IR with perc placement. Pharmacy consulted to dose IV heparin / Warfarin bridge post-op per pharmacy, heparin to start ~1800, warfarin to start 3/18. Per Dr. Pascal Lux (IR), uncomplicated procedure.   3/19:  Remains in Afib. On home dose PO diltiazem CD 180mg  qday.  CBC: Pancytopenia - stable overnight.  No bleeding noted by RN.   Heparin:  No issues with heparin infusion per RN, running at 1900 units/hr.  1300 HL = 0.65, remains  therapeutic, but trending up.   Coumadin: LD coumadin 3/13. INR 1.36-->1.25, no response to first dose coumadin as expected. Home dose coumadin 5mg  daily except 2.5mg  T/R.  Carb modified diet.  Wt 106kg.     Goal of Therapy:  Heparin level 0.3-0.7 units/ml Monitor platelets by anticoagulation protocol: Yes INR goal 2-3   Plan: Coumadin 7.5mg  po x 1 tonight. Repeat heparin level in 8 hours to ensure level does not rise > 0.7.  Daily PT/INR, heparin level  Ralene Bathe, PharmD, BCPS 06/29/2013, 11:15 AM  Pager: [phone]

## 2013-06-29 NOTE — Progress Notes (Signed)
ANTICOAGULATION CONSULT NOTE - Follow Up Consult  Pharmacy Consult for Heparin Indication: atrial fibrillation  Allergies  Allergen Reactions  . Codeine Nausea Only  . Penicillins     Unknown    Patient Measurements: Height: 4\' 8"  (142.2 cm) Weight: 234 lb 2.1 oz (106.2 kg) IBW/kg (Calculated) : 36.3 Heparin Dosing Weight:   Vital Signs: Temp: 101.3 F (38.5 C) (03/19 0353) Temp src: Oral (03/19 0353) BP: 92/52 mmHg (03/19 0435) Pulse Rate: 84 (03/19 0435)  Labs:  Recent Labs  06/26/13 1635 06/27/13 1402  06/28/13 0030 06/28/13 0950 06/28/13 2040 06/29/13 0500  HGB 11.7* 10.0*  --  9.2*  --   --  9.1*  HCT 36.9 30.4*  --  29.1*  --   --  28.0*  PLT 185 137*  --  122*  --   --  126*  LABPROT 14.4  --   --  16.4*  --   --  15.4*  INR 1.14  --   --  1.36  --   --  1.25  HEPARINUNFRC  --   --   < > 0.12* 0.15* <0.10* 0.54  CREATININE 1.95* 1.87*  --  1.87*  --   --  1.92*  TROPONINI <0.30  --   --   --   --   --   --   < > = values in this interval not displayed.  Estimated Creatinine Clearance: 26.9 ml/min (by C-G formula based on Cr of 1.92).   Medications:  Infusions:  . heparin 1,900 Units/hr (06/28/13 2227)    Assessment: Patient with heparin level at goal.  No issues per RN.  Goal of Therapy:  Heparin level 0.3-0.7 units/ml Monitor platelets by anticoagulation protocol: Yes   Plan:  Continue with current drip rate, recheck heparin level at [address] [place] 06/29/2013,6:28 AM

## 2013-06-29 NOTE — Progress Notes (Signed)
Clinical Social Work Department BRIEF PSYCHOSOCIAL ASSESSMENT 06/29/2013  Patient:  [name], [name]     Account Number:  [id number]     Admit date:  06/26/2013  Clinical Social Worker:  Venia Minks  Date/Time:  06/29/2013 12:00 M  Referred by:  Physician  Date Referred:  06/29/2013 Referred for  SNF Placement   Other Referral:   Interview type:  Patient Other interview type:   nieces at bedside    PSYCHOSOCIAL DATA Living Status:  ALONE Admitted from facility:   Level of care:   Primary support name:  Alexandra,Doris Primary support relationship to patient:  FAMILY Degree of support available:   good    CURRENT CONCERNS Current Concerns  Post-Acute Placement   Other Concerns:    SOCIAL WORK ASSESSMENT / PLAN CSW met with patient and patient's nieces at bedside. Patient is alert and oriented X3. patient in need of snf placement. Patient is agreeable to snf and states she has previously been to [facility]. Patient states that she liked it there and would go back. Patient has been very involved with lauren from the cancer center and states that lauren has been an excellent support for her.   Assessment/plan status:   Other assessment/ plan:   Information/referral to community resources:    PATIENT'S/FAMILY'S RESPONSE TO PLAN OF CARE: patient realistic about her needs and the fact that she can not manage at home at this time.

## 2013-06-29 NOTE — Progress Notes (Signed)
PHARMACY BRIEF NOTE - Anticoagulation  Consult for:  IV Heparin Indication:  Atrial fibrillation  With the infusion of 1900 units/hr, the Heparin level drawn at 21:00 tonight is reported as 0.61 units/ml.  This level is within the therapeutic range, 0.3-0.7 units/ml.  The patient's nurse is not aware of any problems with the infusion or any bleeding problems.  Plan:  Continue the current infusion rate.  Next Heparin level with morning labs on 3/20.   AlgoodPh. 06/29/2013 10:04 PM

## 2013-06-30 DIAGNOSIS — D61810 Antineoplastic chemotherapy induced pancytopenia: Secondary | ICD-10-CM

## 2013-06-30 DIAGNOSIS — T451X5A Adverse effect of antineoplastic and immunosuppressive drugs, initial encounter: Secondary | ICD-10-CM

## 2013-06-30 LAB — BASIC METABOLIC PANEL
BUN: 24 mg/dL — AB (ref 6–23)
CALCIUM: 8.8 mg/dL (ref 8.4–10.5)
CO2: 23 meq/L (ref 19–32)
Chloride: 101 mEq/L (ref 96–112)
Creatinine, Ser: 1.87 mg/dL — ABNORMAL HIGH (ref 0.50–1.10)
GFR calc Af Amer: 30 mL/min — ABNORMAL LOW (ref >90)
GFR, EST NON AFRICAN AMERICAN: 26 mL/min — AB (ref >90)
GLUCOSE: 121 mg/dL — AB (ref 70–99)
Potassium: 3.3 mEq/L — ABNORMAL LOW (ref 3.7–5.3)
SODIUM: 138 meq/L (ref 137–147)

## 2013-06-30 LAB — PROTIME-INR
INR: 1.43 (ref 0.00–1.49)
Prothrombin Time: 17.1 seconds — ABNORMAL HIGH (ref 11.6–15.2)

## 2013-06-30 LAB — CBC
HCT: 30.1 % — ABNORMAL LOW (ref 36.0–46.0)
HCT: 31.4 % — ABNORMAL LOW (ref 36.0–46.0)
HEMOGLOBIN: 10 g/dL — AB (ref 12.0–15.0)
Hemoglobin: 10.4 g/dL — ABNORMAL LOW (ref 12.0–15.0)
MCH: 30.5 pg (ref 26.0–34.0)
MCH: 30.4 pg (ref 26.0–34.0)
MCHC: 33.2 g/dL (ref 30.0–36.0)
MCHC: 33.1 g/dL (ref 30.0–36.0)
MCV: 91.8 fL (ref 78.0–100.0)
MCV: 91.8 fL (ref 78.0–100.0)
Platelets: 138 10*3/uL — ABNORMAL LOW (ref 150–400)
Platelets: 148 10*3/uL — ABNORMAL LOW (ref 150–400)
RBC: 3.28 MIL/uL — AB (ref 3.87–5.11)
RBC: 3.42 MIL/uL — ABNORMAL LOW (ref 3.87–5.11)
RDW: 15.4 % (ref 11.5–15.5)
RDW: 15.3 % (ref 11.5–15.5)
WBC: 3.4 10*3/uL — ABNORMAL LOW (ref 4.0–10.5)
WBC: 4.6 10*3/uL (ref 4.0–10.5)

## 2013-06-30 LAB — GLUCOSE, CAPILLARY
GLUCOSE-CAPILLARY: 114 mg/dL — AB (ref 70–99)
GLUCOSE-CAPILLARY: 155 mg/dL — AB (ref 70–99)
GLUCOSE-CAPILLARY: 147 mg/dL — AB (ref 70–99)
Glucose-Capillary: 161 mg/dL — ABNORMAL HIGH (ref 70–99)

## 2013-06-30 LAB — URINE CULTURE: Colony Count: >=100000

## 2013-06-30 LAB — HEPARIN LEVEL (UNFRACTIONATED): Heparin Unfractionated: 0.68 IU/mL (ref 0.30–0.70)

## 2013-06-30 MED ORDER — WARFARIN SODIUM 7.5 MG PO TABS
7.5000 mg | ORAL_TABLET | Freq: Once | ORAL | Status: DC
  Filled 2013-06-30: qty 1

## 2013-06-30 MED ORDER — POTASSIUM CHLORIDE CRYS ER 20 MEQ PO TBCR
40.0000 meq | EXTENDED_RELEASE_TABLET | Freq: Once | ORAL | Status: AC
  Administered 2013-06-30: 40 meq via ORAL
  Filled 2013-06-30 (×2): qty 2

## 2013-06-30 NOTE — Progress Notes (Signed)
PROGRESS NOTE    [name] KZL:[id number] DOB: 1940-05-19 DOA: 06/26/2013 PCP: Dana Richards, MD Primary cardiologist: Dr. Cristopher [name] Primary oncologist: Dr. Concha [name]  HPI/Brief narrative 73 year old female with history of sick sinus syndrome, status post PPM, dyslipidemia, PAF on chronic Coumadin, hypertension, COPD, type II DM, hyperlipidemia, hypothyroid, recurrent NHL, with obstructive right hydronephrosis secondary to lymphoma, was planned for elective cystoscopy and right ureteral stent on 3/17 but admitted on 3/16 with sepsis likely secondary to UTI/pyelonephritis, delirium and rapid A. fib. He was admitted to step down unit. Since patient not stable for ureteral stent, urology recommending right percutaneous nephrostomy by IR. Coumadin had been held in case she would need a nephrostomy and not for cystoscopy/stent.   Assessment/Plan:  1. Severe sepsis secondary to complicated UTI(Klebsiella, Citrobacter)/acute pyelonephritis: Treated with IV fluids and continue IV Rocephin- sensitive. Leukocytosis resolved. Clinically looks better. Low-grade fevers. 2. Obstructive right hydronephrosis: Secondary to recurrent NHL. Status post right percutaneous nephrostomy on 3/17. Creatinine better than on admission but plateaued. Urology followup appreciated. Discussed with IR-gross hematuria and blood clots in the drainage bag-recommended holding anticoagulation. 3. A. fib with RVR: Patient was initially treated with IV Cardizem drip and then transitioned to home oral Cardizem on 3/18. Controlled ventricular rate. Anticoagulation held 3/20 secondary to hematuria. 4. Acute on chronic kidney disease: Creatinine in January was in the 1.3 range. Now likely worsened by sepsis and hydronephrosis. Improving post hydration and right nephrostomy. Creatinine has plateaued. Trend daily BMP. 5. Delirium: Likely secondary to infection. Resolved. 6. Chronic anemia: Stable. Transfuse if hemoglobin  less than 7 g per DL. 7. Status post PPM 8. Hypertension: Patient running soft blood pressures. 9. COPD: Stable 10. Type II DM uncontrolled. Continue SSI. 11. Recurrent NHL: Oncology input appreciated. 12. Hypokalemia: Replace and follow.  Code Status: Full Family Communication: discussed with patients HCPOA/[name],Doris via phone on 06/29/2013 Disposition Plan: DC to SNF when medically stable.   Consultants:  Urology  Interventional radiology  Medical oncology  Procedures:  Percutaneous right nephrostomy  Antibiotics:  IV Rocephin 3/16 >   Subjective: Denied complaints.   Objective: Filed Vitals:   06/30/13 0817 06/30/13 0848 06/30/13 0933 06/30/13 1058  BP:   110/66   Pulse:   99   Temp:   100.3 F (37.9 C) 99 F (37.2 C)  TempSrc:   Oral Oral  Resp:   18   Height:      Weight:      SpO2: 97% 95% 97%    blood pressure 100/58  Intake/Output Summary (Last 24 hours) at 06/30/13 1345 Last data filed at 06/30/13 1250  Gross per 24 hour  Intake 1196.1 ml  Output   2575 ml  Net -1378.9 ml   Filed Weights   06/26/13 2300 06/28/13 0400  Weight: 103.6 kg (228 lb 6.3 oz) 106.2 kg (234 lb 2.1 oz)     Exam:  General exam: Elderly female was seen sitting comfortably on chair this morning. Respiratory system: Clear. No increased work of breathing. Cardiovascular system: S1 & S2 heard, irregularly irregular. No JVD, murmurs, gallops, clicks or pedal edema. Telemetry: A. fib with controlled ventricular rate in the 80s and intermittently V paced rhythm. Gastrointestinal system: Abdomen is nondistended, soft and nontender. Normal bowel sounds heard. Right percutaneous nephrostomy draining with hematuria. Central nervous system: Alert and oriented. No focal neurological deficits. Extremities: Symmetric 5 x 5 power.   Data Reviewed: Basic Metabolic Panel:  Recent Labs Lab 06/26/13 1635 06/27/13 1402 06/28/13  0030 06/29/13 0500 06/30/13 0353  NA 137  138 135* 136* 138  K 4.4 3.9 3.6* 3.5* 3.3*  CL 95* 101 99 101 101  CO2 25 23 23 22 23   GLUCOSE 184* 147* 121* 127* 121*  BUN 25* 23 23 25* 24*  CREATININE 1.95* 1.87* 1.87* 1.92* 1.87*  CALCIUM 10.1 8.8 8.6 8.6 8.8   Liver Function Tests:  Recent Labs Lab 06/26/13 1635  AST 17  ALT 11  ALKPHOS 54  BILITOT 0.4  PROT 6.7  ALBUMIN 3.5   No results found for this basename: LIPASE, AMYLASE,  in the last 168 hours No results found for this basename: AMMONIA,  in the last 168 hours CBC:  Recent Labs Lab 06/26/13 1635 06/27/13 1402 06/28/13 0030 06/29/13 0500 06/30/13 0353  WBC 11.4* 8.6 6.6 5.3 3.4*  NEUTROABS 9.3*  --   --   --   --   HGB 11.7* 10.0* 9.2* 9.1* 10.0*  HCT 36.9 30.4* 29.1* 28.0* 30.1*  MCV 95.6 94.4 95.4 93.3 91.8  PLT 185 137* 122* 126* 138*   Cardiac Enzymes:  Recent Labs Lab 06/26/13 1635  TROPONINI <0.30   BNP (last 3 results) No results found for this basename: PROBNP,  in the last 8760 hours CBG:  Recent Labs Lab 06/29/13 1215 06/29/13 1645 06/29/13 2123 06/30/13 0749 06/30/13 1154  GLUCAP 166* 115* 129* 114* 161*    Recent Results (from the past 240 hour(s))  URINE CULTURE     Status: None   Collection Time    06/26/13  6:23 PM      Result Value Ref Range Status   Specimen Description URINE, CATHETERIZED   Final   Special Requests NONE   Final   Culture  Setup Time     Final   Value: 06/27/2013 00:13     Performed at [facility]     Final   Value: >=100,000 COLONIES/ML     Performed at [facility]   Culture     Final   Value: [place]     Performed at [facility]   Report Status 06/30/2013 FINAL   Final   Organism ID, Bacteria KLEBSIELLA PNEUMONIAE   Final   Organism ID, Bacteria CITROBACTER FREUNDII   Final  MRSA PCR SCREENING     Status: None   Collection Time    06/26/13 11:19 PM      Result Value Ref Range Status   MRSA by PCR  NEGATIVE  NEGATIVE Final   Comment:            The GeneXpert MRSA Assay (FDA     approved for NASAL specimens     only), is one component of a     comprehensive MRSA colonization     surveillance program. It is not     intended to diagnose MRSA     infection nor to guide or     monitor treatment for     MRSA infections.       Studies: No results found.      Scheduled Meds: . allopurinol  300 mg Oral Daily  . atorvastatin  20 mg Oral q1800  . cefTRIAXone (ROCEPHIN)  IV  1 g Intravenous Q24H  . citalopram  20 mg Oral q morning - 10a  . diltiazem  180 mg Oral Daily  . guaiFENesin  600 mg Oral BID  . insulin aspart  0-5  Units Subcutaneous QHS  . insulin aspart  0-9 Units Subcutaneous TID WC  . levothyroxine  50 mcg Oral QAC breakfast  . magnesium oxide  200 mg Oral Daily  . sodium chloride  3 mL Intravenous Q12H  . tiotropium  18 mcg Inhalation Daily  . Warfarin - Pharmacist Dosing Inpatient   Does not apply q1800   Continuous Infusions:    Principal Problem:   Severe sepsis Active Problems:   Essential hypertension, benign   Diffuse large B cell lymphoma   Diabetes mellitus type 2 in obese   Atrial fibrillation with RVR   Hydronephrosis, right   UTI (lower urinary tract infection)   Sepsis secondary to UTI    Time spent: 64 minutes    Graceanna Theissen, MD, FACP, FHM. [facility] Pager [phone]  If 7PM-7AM, please contact night-coverage [email or web address] Password [email or web address] 06/30/2013, 1:45 PM    LOS: 4 days

## 2013-06-30 NOTE — Progress Notes (Signed)
Patient ID: [name], female   DOB: 20-Dec-1940, 73 y.o.   MRN: [id number]  Pt c/o "aching all over". She is sitting in a chair.   Filed Vitals:   06/30/13 0601  BP: 142/84  Pulse: 113  Temp: 98.6 F (37 C)  Resp: 20   PE: NAD Sitting in a chair Rt Nx with excellent UOP  CBC    Component Value Date/Time   WBC 3.4* 06/30/2013 0353   WBC 3.1* 06/12/2013 1425   RBC 3.28* 06/30/2013 0353   RBC 3.44* 06/12/2013 1425   HGB 10.0* 06/30/2013 0353   HGB 10.6* 06/12/2013 1425   HCT 30.1* 06/30/2013 0353   HCT 32.9* 06/12/2013 1425   PLT 138* 06/30/2013 0353   PLT 281 06/12/2013 1425   MCV 91.8 06/30/2013 0353   MCV 95.6 06/12/2013 1425   MCH 30.5 06/30/2013 0353   MCH 30.7 06/12/2013 1425   MCHC 33.2 06/30/2013 0353   MCHC 32.1 06/12/2013 1425   RDW 15.4 06/30/2013 0353   RDW 17.3* 06/12/2013 1425   LYMPHSABS 0.7 06/26/2013 1635   LYMPHSABS 1.1 06/12/2013 1425   MONOABS 1.5* 06/26/2013 1635   MONOABS 0.6 06/12/2013 1425   EOSABS 0.0 06/26/2013 1635   EOSABS 0.1 06/12/2013 1425   BASOSABS 0.0 06/26/2013 1635   BASOSABS 0.0 06/12/2013 1425    BMET    Component Value Date/Time   NA 138 06/30/2013 0353   NA 141 06/12/2013 1425   NA 141 10/12/2011 0957   K 3.3* 06/30/2013 0353   K 4.5 06/12/2013 1425   K 4.4 10/12/2011 0957   CL 101 06/30/2013 0353   CL 102 09/21/2012 0917   CL 95* 10/12/2011 0957   CO2 23 06/30/2013 0353   CO2 28 06/12/2013 1425   CO2 29 10/12/2011 0957   GLUCOSE 121* 06/30/2013 0353   GLUCOSE 101 06/12/2013 1425   GLUCOSE 161* 09/21/2012 0917   GLUCOSE 131* 10/12/2011 0957   BUN 24* 06/30/2013 0353   BUN 21.6 06/12/2013 1425   BUN 15 10/12/2011 0957   CREATININE 1.87* 06/30/2013 0353   CREATININE 1.7* 06/12/2013 1425   CREATININE 1.10 04/19/2013 1420   CALCIUM 8.8 06/30/2013 0353   CALCIUM 10.4 06/12/2013 1425   CALCIUM 9.1 10/12/2011 0957   GFRNONAA 26* 06/30/2013 0353   GFRAA 30* 06/30/2013 0353    A/P -  -sepsis - UTI - Citrobacter and klebsiella on Cx. Fever curve improving -right hydro - Rt nx draining  well. Try internalize to stent as outpt.  -CRF - Cr ~ 1.9 - new baseline?

## 2013-06-30 NOTE — Progress Notes (Signed)
CSW gave patient's family bed offers. They would like patient to go back to [facility]. CSW submitted auth. Patient should be ready tomorrow.  Sabine Tenenbaum C. Parker MSW, [place]

## 2013-06-30 NOTE — Progress Notes (Signed)
PT Cancellation Note  ___Treatment cancelled today due to medical issues with patient which prohibited therapy  ___ Treatment cancelled today due to patient receiving procedure or test   _X_ Treatment cancelled today due to patient's refusal to participate as she just got back to bed from recliner with nursing and requested to rest.  ___ Treatment cancelled today due to  Rica Koyanagi  PTA [facility]  Acute  Rehab Pager      [phone]

## 2013-06-30 NOTE — Progress Notes (Signed)
3 Days Post-Op  Subjective: Pt now with gross hematuria via rt PCN and voided pathways; IV heparin and coumadin stopped; has mild rt flank discomfort primarily with coughing  Objective: Vital signs in last 24 hours: Temp:  [98.6 F (37 C)-100.5 F (38.1 C)] 99 F (37.2 C) (03/20 1058) Pulse Rate:  [85-113] 99 (03/20 0933) Resp:  [18-20] 18 (03/20 0933) BP: (109-142)/(57-84) 110/66 mmHg (03/20 0933) SpO2:  [95 %-97 %] 97 % (03/20 0933) Last BM Date: 06/26/13  Intake/Output from previous day: 03/19 0701 - 03/20 0700 In: 737.6 [P.O.:240; I.V.:447.6; IV Piggyback:50] Out: 2125 [Urine:2125] Intake/Output this shift: Total I/O In: 455.5 [P.O.:360; I.V.:85.5; Other:10] Out: 1100 [Urine:1100]  Rt PCN intact, insertion site ok, mildly tender; 200-300 cc's bright red bloody urine with clot in bag and within catheter lumen  Lab Results:   Recent Labs  06/29/13 0500 06/30/13 0353  WBC 5.3 3.4*  HGB 9.1* 10.0*  HCT 28.0* 30.1*  PLT 126* 138*   BMET  Recent Labs  06/29/13 0500 06/30/13 0353  NA 136* 138  K 3.5* 3.3*  CL 101 101  CO2 22 23  GLUCOSE 127* 121*  BUN 25* 24*  CREATININE 1.92* 1.87*  CALCIUM 8.6 8.8   PT/INR  Recent Labs  06/29/13 0500 06/30/13 0353  LABPROT 15.4* 17.1*  INR 1.25 1.43   ABG No results found for this basename: PHART, PCO2, PO2, HCO3,  in the last 72 hours  Studies/Results: No results found.  Anti-infectives: Anti-infectives   Start     Dose/Rate Route Frequency Ordered Stop   06/27/13 2045  cefTRIAXone (ROCEPHIN) 1 g in dextrose 5 % 50 mL IVPB     1 g 100 mL/hr over 30 Minutes Intravenous Every 24 hours 06/26/13 2134     06/26/13 2045  cefTRIAXone (ROCEPHIN) 1 g in dextrose 5 % 50 mL IVPB     1 g 100 mL/hr over 30 Minutes Intravenous  Once 06/26/13 2031 06/26/13 2156      Assessment/Plan: S/p right PCN 3/17 secondary to obstructive hydronephrosis from NHL;urosepsis; renal insuff; on coumadin/IV heparin for a fib; now with  gross hematuria- case d/w Drs.Hoss/Hongalgi- rec stopping IV heparin/coumadin, PCN capped for next 24 hrs; monitor Hgb, renal fxn closely;replace K;  hydrate; cont antbx   LOS: 4 days    ALLRED,D [facility] 06/30/2013

## 2013-06-30 NOTE — Progress Notes (Signed)
Pt noted to have increased HR with activity/coughing elevating to low 160's at times, though HR quickly returns to low 100s-120s at rest. Pt asymptomatic. Fredirick Maudlin, [facility] NP notified. No new orders received. Will continue to monitor.

## 2013-06-30 NOTE — Progress Notes (Signed)
[name]   DOB:08/26/1940   QQ#:[id number]   EPP#:[id number]  Subjective: Patient reports bleeding via nephrostomy.  Heparin gtt and coumadin held.  Patient denies pain but reports subjectively feeling cold.  She also reports being without a bowel movement over the past few days.   Objective:  Filed Vitals:   06/30/13 1344  BP: 126/69  Pulse: 88  Temp: 99.5 F (37.5 C)  Resp: 20    Body mass index is 52.52 kg/(m^2).  Intake/Output Summary (Last 24 hours) at 06/30/13 1502 Last data filed at 06/30/13 1345  Gross per 24 hour  Intake 1177.1 ml  Output   2775 ml  Net -1597.9 ml    Non-toxic appearing.   Sclerae unicteric  Oropharynx clear  Lungs clear -- no rales or rhonchi  Heart irregular irregular  Abdomen benign, obese  MSK no peripheral edema  Neuro nonfocal   CBG (last 3)   Recent Labs  06/29/13 2123 06/30/13 0749 06/30/13 1154  GLUCAP 129* 114* 161*     Labs:  Lab Results  Component Value Date   WBC 4.6 06/30/2013   HGB 10.4* 06/30/2013   HCT 31.4* 06/30/2013   MCV 91.8 06/30/2013   PLT 148* 06/30/2013   NEUTROABS 9.3* 09/17/3014   Basic Metabolic Panel:  Recent Labs Lab 06/26/13 1635 06/27/13 1402 06/28/13 0030 06/29/13 0500 06/30/13 0353  NA 137 138 135* 136* 138  K 4.4 3.9 3.6* 3.5* 3.3*  CL 95* 101 99 101 101  CO2 25 23 23 22 23   GLUCOSE 184* 147* 121* 127* 121*  BUN 25* 23 23 25* 24*  CREATININE 1.95* 1.87* 1.87* 1.92* 1.87*  CALCIUM 10.1 8.8 8.6 8.6 8.8   GFR Estimated Creatinine Clearance: 27.6 ml/min (by C-G formula based on Cr of 1.87). Liver Function Tests:  Recent Labs Lab 06/26/13 1635  AST 17  ALT 11  ALKPHOS 54  BILITOT 0.4  PROT 6.7  ALBUMIN 3.5   Coagulation profile  Recent Labs Lab 06/26/13 1635 06/28/13 0030 06/29/13 0500 06/30/13 0353  INR 1.14 1.36 1.25 1.43    CBC:  Recent Labs Lab 06/26/13 1635 06/27/13 1402 06/28/13 0030 06/29/13 0500 06/30/13 0353 06/30/13 1355  WBC 11.4* 8.6 6.6 5.3 3.4*  4.6  NEUTROABS 9.3*  --   --   --   --   --   HGB 11.7* 10.0* 9.2* 9.1* 10.0* 10.4*  HCT 36.9 30.4* 29.1* 28.0* 30.1* 31.4*  MCV 95.6 94.4 95.4 93.3 91.8 91.8  PLT 185 137* 122* 126* 138* 148*   Cardiac Enzymes:  Recent Labs Lab 06/26/13 1635  TROPONINI <0.30   BNP: No components found with this basename: POCBNP,  CBG:  Recent Labs Lab 06/29/13 1215 06/29/13 1645 06/29/13 2123 06/30/13 0749 06/30/13 1154  GLUCAP 166* 115* 129* 114* 161*   Microbiology Recent Results (from the past 240 hour(s))  URINE CULTURE     Status: None   Collection Time    06/26/13  6:23 PM      Result Value Ref Range Status   Specimen Description URINE, CATHETERIZED   Final   Special Requests NONE   Final   Culture  Setup Time     Final   Value: 06/27/2013 00:13     Performed at [place]     Final   Value: >=100,000 COLONIES/ML     Performed at [facility]   Culture     Final   Value: KLEBSIELLA PNEUMONIAE  CITROBACTER FREUNDII     Performed at [facility]   Report Status 06/30/2013 FINAL   Final   Organism ID, Bacteria KLEBSIELLA PNEUMONIAE   Final   Organism ID, Bacteria CITROBACTER FREUNDII   Final  MRSA PCR SCREENING     Status: None   Collection Time    06/26/13 11:19 PM      Result Value Ref Range Status   MRSA by PCR NEGATIVE  NEGATIVE Final   Comment:            The GeneXpert MRSA Assay (FDA     approved for NASAL specimens     only), is one component of a     comprehensive MRSA colonization     surveillance program. It is not     intended to diagnose MRSA     infection nor to guide or     monitor treatment for     MRSA infections.      Studies:  No results found.  Assessment/Plan: 73 y.o. with the following:  1. Gross Hematuria. --Holding a/c in setting of active bleeding. Urology aware. Nephrostomy tube capped.   2. Urosepsis, as noted above resolving. --Plan per primary team.  Clinically improving. No blood  pressure support presently.  Afebrile now. On rocephin urinary cultures consistent with K. Pneum, Citrobacter Freu. Awaiting sensitivity.    3. Acute on Chronic Kidney disease w right hydronephrosis secondary #3 plus/minus #2. --S/p R percutaneous nephrostomy by IR on yesterday (3/17).   Creatinine 1.87 today.  (Range 1.1 to 1.6 over the past one year).  4.  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  -CT Scans of C/A/P (2/23)  consistent with a further moderate increase in retroperitoneal lymphadenopathy complicated by right hydronephrosis. . Based on these scans, she will require further chemotherapy.  We discussed treatment with R-ICE, R-CEEP and Rituxan plus revlimid as potential options based on NCCN guidelines.  I spoke with the family and the patient, we will likely pursue least aggressive therapy at this point (Rituxan plus revlimid). I also discussed with Dr. Cassell Clement of [facility], she is unlikely to be a candidate for transplant.  We will HOLD chemotherapy in the setting of active infection  Or #1.   --Fevers could also be due to lymphoma as well.   5. Anemia/ thrombocytopenia  -- Anemia secondary to #1.  Please follow CBC daily and transfuse for symptoms.    Please transfuse for symptoms or hbg less than 7.    6. Hypothyroidism: Levothyroxine per primary team  7. Hypertension: Diltiazem gtt per primary team..  8. Diabetes mellitus, type II: SSI per primary team.  9. Hyperlipidemia: Pravastatin per PCP.  10.History of atrial fibrillation: Diltiazem, pacemaker. Coumadin. Was on hold per procedure now on heparin to coumadin bridge.  11. COPD/ Chronic Bronchitis exacerbation.  --Stable.   We will follow this patient with you.    Oda Placke, MD 06/30/2013  3:02 PM

## 2013-06-30 NOTE — Progress Notes (Signed)
Patient has been approved with [employer]. 3 days. Auth #[id number]. Next review date is Monday march 23rd. Rate: RVB. Rhyen Mazariego C. [place] MSW, [place]

## 2013-06-30 NOTE — Progress Notes (Signed)
Over the course of the morning, the urine in R nephrostomy became increasingly bloody. It is now bright red with a clot noted in the collection bag. Lennette Bihari, [place] notified on floor and he went to see the pt and will discuss with MD. Pharmacist also made aware. Heparin dripped stopped until further notice. Callie Fielding RN

## 2013-06-30 NOTE — Progress Notes (Signed)
[place] for IV heparin / Coumadin Indication: Atrial fibrillation  Allergies  Allergen Reactions  . Codeine Nausea Only  . Penicillins     Unknown    Patient Measurements: Height: 4\' 8"  (142.2 cm) Weight: 234 lb 2.1 oz (106.2 kg) IBW/kg (Calculated) : 36.3 Heparin Dosing Weight: 71kg  Vital Signs: Temp: 98.6 F (37 C) (03/20 0601) Temp src: Oral (03/20 0601) BP: 142/84 mmHg (03/20 0601) Pulse Rate: 113 (03/20 0601)  Labs:  Recent Labs  06/28/13 0030  06/29/13 0500 06/29/13 1250 06/29/13 2048 06/30/13 0353  HGB 9.2*  --  9.1*  --   --  10.0*  HCT 29.1*  --  28.0*  --   --  30.1*  PLT 122*  --  126*  --   --  138*  LABPROT 16.4*  --  15.4*  --   --  17.1*  INR 1.36  --  1.25  --   --  1.43  HEPARINUNFRC 0.12*  < > 0.54 0.65 0.61 0.68  CREATININE 1.87*  --  1.92*  --   --  1.87*  < > = values in this interval not displayed.  Estimated Creatinine Clearance: 27.6 ml/min (by C-G formula based on Cr of 1.87).   Medical History: Past Medical History  Diagnosis Date  . Sick sinus syndrome   . Hematoma     At the site of the pacemaker insertion.  . Hypotension   . Dyslipidemia   . History of atrial fibrillation   . Arthritis     Osteoarthritis  . Port-a-cath in place 12/08/10  . Status post chemotherapy completed 03/2011    R - CHOP q 3 weeks x 6  . S/P radiation therapy 05/28/2011 - 06/26/2011    Right Abdomen and Right Pelvis/3060 cGy in 17 Fractions  . Hypertension   . ASHD (arteriosclerotic heart disease)   . Gout   . COPD (chronic obstructive pulmonary disease)   . Asthma   . Hyperlipidemia   . Diabetes mellitus   . Hypothyroidism   . Diffuse large B cell lymphoma     Dr. Chisom,oncology  . Cancer 10/2010    large B cell lymphoma  . 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 Pacemaker     left chest  . Dysrhythmia     Hx. A. Fib, sick sinus syndrome- Pacemaker inserted.   . Cataracts, bilateral    Assessment: 58 yoF with PMHx DLBCL, DM, Afib on chronic coumadin, and hydronephrosis presents with severe sepsis 2/2 complicated UTI/acute pyelonephritis, Afib, and A/CKD. Placed on CTX pending culture results. Pt had planned stent placement 3/17 for obstructive hydronephrosis, however cancelled due to not feeling well. After admitted to [facility], procedure scheduled inpt however pt unstable for anesthesia, therefore performed via IR with perc placement. Pharmacy consulted to dose IV heparin / Warfarin bridge post-op per pharmacy, heparin to start ~1800, warfarin to start 3/18. Per Dr. Pascal Lux (IR), uncomplicated procedure.    HL = 0.68, remains therapeutic with heparin infusion at 1900 units/hr.   CBC: slightly improved today.  Hgb 10.0, platelets 138K.  No bleeding noted by RN.   INR = 1.43, warfarin doses administered inpatient so far: 5mg , 7.5mg   Home warfarin dose is 5 mg daily except 2.5 mg Tuesdays/Thursdays.  Remains in Afib. On home dose PO diltiazem CD 180mg  qday.   Carb modified diet.   Goal of Therapy:  Heparin level 0.3-0.7 units/ml Monitor platelets by  anticoagulation protocol: Yes INR goal 2-3   Plan: 1.  Continue heparin infusion at 1900 units/hr.  Will repeat heparin level in 8 hours to make sure level does not continue to trend up above therapeutic range. 2.  Warfarin 7.5 mg po once tonight. 3.  Daily INR, HL, CBC.  Hershal Coria, PharmD, BCPS Pager: [phone] 06/30/2013 7:40 AM

## 2013-07-01 LAB — CBC
HCT: 28.1 % — ABNORMAL LOW (ref 36.0–46.0)
Hemoglobin: 9.1 g/dL — ABNORMAL LOW (ref 12.0–15.0)
MCH: 29.7 pg (ref 26.0–34.0)
MCHC: 32.4 g/dL (ref 30.0–36.0)
MCV: 91.8 fL (ref 78.0–100.0)
Platelets: 138 10*3/uL — ABNORMAL LOW (ref 150–400)
RBC: 3.06 MIL/uL — AB (ref 3.87–5.11)
RDW: 15.4 % (ref 11.5–15.5)
WBC: 3.8 10*3/uL — AB (ref 4.0–10.5)

## 2013-07-01 LAB — GLUCOSE, CAPILLARY
GLUCOSE-CAPILLARY: 202 mg/dL — AB (ref 70–99)
Glucose-Capillary: 132 mg/dL — ABNORMAL HIGH (ref 70–99)
Glucose-Capillary: 152 mg/dL — ABNORMAL HIGH (ref 70–99)
Glucose-Capillary: 152 mg/dL — ABNORMAL HIGH (ref 70–99)

## 2013-07-01 LAB — BASIC METABOLIC PANEL
BUN: 22 mg/dL (ref 6–23)
CO2: 26 meq/L (ref 19–32)
Calcium: 9.3 mg/dL (ref 8.4–10.5)
Chloride: 100 mEq/L (ref 96–112)
Creatinine, Ser: 1.71 mg/dL — ABNORMAL HIGH (ref 0.50–1.10)
GFR calc Af Amer: 33 mL/min — ABNORMAL LOW (ref >90)
GFR calc non Af Amer: 29 mL/min — ABNORMAL LOW (ref >90)
Glucose, Bld: 132 mg/dL — ABNORMAL HIGH (ref 70–99)
Potassium: 3.4 mEq/L — ABNORMAL LOW (ref 3.7–5.3)
SODIUM: 139 meq/L (ref 137–147)

## 2013-07-01 LAB — PROTIME-INR
INR: 1.43 (ref 0.00–1.49)
PROTHROMBIN TIME: 17.1 s — AB (ref 11.6–15.2)

## 2013-07-01 MED ORDER — POLYETHYLENE GLYCOL 3350 17 G PO PACK
17.0000 g | PACK | Freq: Every day | ORAL | Status: DC
  Administered 2013-07-01 – 2013-07-02 (×2): 17 g via ORAL
  Filled 2013-07-01 (×3): qty 1

## 2013-07-01 MED ORDER — SENNA 8.6 MG PO TABS
2.0000 | ORAL_TABLET | Freq: Every day | ORAL | Status: DC
  Administered 2013-07-01 – 2013-07-03 (×2): 17.2 mg via ORAL
  Filled 2013-07-01 (×2): qty 2

## 2013-07-01 MED ORDER — URELLE 81 MG PO TABS
1.0000 | ORAL_TABLET | Freq: Three times a day (TID) | ORAL | Status: DC
  Administered 2013-07-01 – 2013-07-03 (×8): 81 mg via ORAL
  Filled 2013-07-01 (×9): qty 1

## 2013-07-01 MED ORDER — POTASSIUM CHLORIDE CRYS ER 20 MEQ PO TBCR
40.0000 meq | EXTENDED_RELEASE_TABLET | Freq: Once | ORAL | Status: AC
  Administered 2013-07-01: 40 meq via ORAL
  Filled 2013-07-01: qty 2

## 2013-07-01 MED ORDER — PHENAZOPYRIDINE HCL 100 MG PO TABS: 100.0000 mg | ORAL_TABLET | Freq: Three times a day (TID) | ORAL | Status: DC

## 2013-07-01 NOTE — Progress Notes (Signed)
PROGRESS NOTE    [name] MVE:[id number] DOB: February 02, 1941 DOA: 06/26/2013 PCP: [name], MD Primary cardiologist: Dr. Cristopher Peru Primary oncologist: Dr. Concha Norway  HPI/Brief narrative 73 year old female with history of sick sinus syndrome, status post PPM, dyslipidemia, PAF on chronic Coumadin, hypertension, COPD, type II DM, hyperlipidemia, hypothyroid, recurrent NHL, with obstructive right hydronephrosis secondary to lymphoma, was planned for elective cystoscopy and right ureteral stent on 3/17 but admitted on 3/16 with sepsis likely secondary to UTI/pyelonephritis, delirium and rapid A. fib. He was admitted to step down unit. Since patient not stable for ureteral stent, urology recommending right percutaneous nephrostomy by IR. Coumadin had been held in case she would need a nephrostomy and not for cystoscopy/stent.   Assessment/Plan:  1. Severe sepsis secondary to complicated UTI(Klebsiella, Citrobacter)/acute pyelonephritis: Treated with IV fluids and continue IV Rocephin- sensitive. Leukocytosis resolved. Clinically looks better. Low-grade fevers- ? Due to NHL. Start Urelle for dysuria. 2. Obstructive right hydronephrosis: Secondary to recurrent NHL. Status post right percutaneous nephrostomy on 3/17. Creatinine slowly improving. Urology followup appreciated. Discussed with IR 3/20-gross hematuria and blood clots in the drainage bag-recommended holding anticoagulation & capped nephrostomy. Management per urology and IR. 3. A. fib with RVR: Patient was initially treated with IV Cardizem drip and then transitioned to home oral Cardizem on 3/18. Controlled ventricular rate. Anticoagulation held 3/20 secondary to hematuria-resume when cleared by IR. 4. Acute on chronic kidney disease: Creatinine in January was in the 1.3 range. Now likely worsened by sepsis and hydronephrosis. Improving post hydration and right nephrostomy. Creatinine slowly improving (as per oncology  creatinine has ranged from 1.1-1.6 over the past year). Trend daily BMP. 5. Delirium: Likely secondary to infection. Resolved. 6. Chronic anemia: Stable. Transfuse if hemoglobin less than 7 g per DL. 7. Status post PPM 8. Hypertension: Patient running soft blood pressures. 9. COPD: Stable 10. Type II DM uncontrolled. Continue SSI. 11. Recurrent NHL: Oncology input appreciated. 12. Hypokalemia: Replace and follow. 13. Constipation: Bowel regimen started 3/21  Code Status: Full Family Communication: discussed with patients 2 nieces/HCPOA's st bedside on 3/21 Disposition Plan: DC to SNF when medically stable. Not medically ready for DC.   Consultants:  Urology  Interventional radiology  Medical oncology  Procedures:  Percutaneous right nephrostomy 3/17  Antibiotics:  IV Rocephin 3/16 >   Subjective: Dysuria associated with lower abdominal pains. No back pain. Constipation.  Objective: Filed Vitals:   06/30/13 1344 06/30/13 2031 07/01/13 0457 07/01/13 0849  BP: 126/69 106/57 129/51   Pulse: 88 91 93   Temp: 99.5 F (37.5 C) 99.4 F (37.4 C) 98.6 F (37 C)   TempSrc: Oral Oral Oral   Resp: 20 18 18    Height:      Weight:      SpO2: 98% 93% 95% 95%   blood pressure 100/58  Intake/Output Summary (Last 24 hours) at 07/01/13 1238 Last data filed at 07/01/13 0825  Gross per 24 hour  Intake    610 ml  Output    400 ml  Net    210 ml   Filed Weights   06/26/13 2300 06/28/13 0400  Weight: 103.6 kg (228 lb 6.3 oz) 106.2 kg (234 lb 2.1 oz)     Exam:  General exam: Elderly female was seen sitting comfortably on chair this morning. Respiratory system: Clear. No increased work of breathing. Cardiovascular system: S1 & S2 heard, irregularly irregular. No JVD, murmurs, gallops, clicks or pedal edema. Gastrointestinal system: Abdomen is nondistended, soft and  nontender. Normal bowel sounds heard. Right percutaneous nephrostomy - capped. Central nervous system: Alert  and oriented. No focal neurological deficits. Extremities: Symmetric 5 x 5 power.   Data Reviewed: Basic Metabolic Panel:  Recent Labs Lab 06/27/13 1402 06/28/13 0030 06/29/13 0500 06/30/13 0353 07/01/13 0427  NA 138 135* 136* 138 139  K 3.9 3.6* 3.5* 3.3* 3.4*  CL 101 99 101 101 100  CO2 23 23 22 23 26   GLUCOSE 147* 121* 127* 121* 132*  BUN 23 23 25* 24* 22  CREATININE 1.87* 1.87* 1.92* 1.87* 1.71*  CALCIUM 8.8 8.6 8.6 8.8 9.3   Liver Function Tests:  Recent Labs Lab 06/26/13 1635  AST 17  ALT 11  ALKPHOS 54  BILITOT 0.4  PROT 6.7  ALBUMIN 3.5   No results found for this basename: LIPASE, AMYLASE,  in the last 168 hours No results found for this basename: AMMONIA,  in the last 168 hours CBC:  Recent Labs Lab 06/26/13 1635  06/28/13 0030 06/29/13 0500 06/30/13 0353 06/30/13 1355 07/01/13 0427  WBC 11.4*  < > 6.6 5.3 3.4* 4.6 3.8*  NEUTROABS 9.3*  --   --   --   --   --   --   HGB 11.7*  < > 9.2* 9.1* 10.0* 10.4* 9.1*  HCT 36.9  < > 29.1* 28.0* 30.1* 31.4* 28.1*  MCV 95.6  < > 95.4 93.3 91.8 91.8 91.8  PLT 185  < > 122* 126* 138* 148* 138*  < > = values in this interval not displayed. Cardiac Enzymes:  Recent Labs Lab 06/26/13 1635  TROPONINI <0.30   BNP (last 3 results) No results found for this basename: PROBNP,  in the last 8760 hours CBG:  Recent Labs Lab 06/30/13 1154 06/30/13 1713 06/30/13 2107 07/01/13 0746 07/01/13 1146  GLUCAP 161* 155* 147* 132* 152*    Recent Results (from the past 240 hour(s))  URINE CULTURE     Status: None   Collection Time    06/26/13  6:23 PM      Result Value Ref Range Status   Specimen Description URINE, CATHETERIZED   Final   Special Requests NONE   Final   Culture  Setup Time     Final   Value: 06/27/2013 00:13     Performed at [place]     Final   Value: >=100,000 COLONIES/ML     Performed at [facility]   Culture     Final   Value: [place]     Performed at [facility]   Report Status 06/30/2013 FINAL   Final   Organism ID, Bacteria KLEBSIELLA PNEUMONIAE   Final   Organism ID, Bacteria CITROBACTER FREUNDII   Final  MRSA PCR SCREENING     Status: None   Collection Time    06/26/13 11:19 PM      Result Value Ref Range Status   MRSA by PCR NEGATIVE  NEGATIVE Final   Comment:            The GeneXpert MRSA Assay (FDA     approved for NASAL specimens     only), is one component of a     comprehensive MRSA colonization     surveillance program. It is not     intended to diagnose MRSA     infection nor to guide or     monitor treatment for  MRSA infections.       Studies: No results found.      Scheduled Meds: . allopurinol  300 mg Oral Daily  . atorvastatin  20 mg Oral q1800  . cefTRIAXone (ROCEPHIN)  IV  1 g Intravenous Q24H  . citalopram  20 mg Oral q morning - 10a  . diltiazem  180 mg Oral Daily  . guaiFENesin  600 mg Oral BID  . insulin aspart  0-5 Units Subcutaneous QHS  . insulin aspart  0-9 Units Subcutaneous TID WC  . levothyroxine  50 mcg Oral QAC breakfast  . magnesium oxide  200 mg Oral Daily  . polyethylene glycol  17 g Oral Daily  . senna  2 tablet Oral Daily  . sodium chloride  3 mL Intravenous Q12H  . tiotropium  18 mcg Inhalation Daily  . URELLE  1 tablet Oral TID  . Warfarin - Pharmacist Dosing Inpatient   Does not apply q1800   Continuous Infusions:    Principal Problem:   Severe sepsis Active Problems:   Essential hypertension, benign   Diffuse large B cell lymphoma   Diabetes mellitus type 2 in obese   Atrial fibrillation with RVR   Hydronephrosis, right   UTI (lower urinary tract infection)   Sepsis secondary to UTI    Time spent: 70 minutes    Jacquees Gongora, MD, FACP, FHM. [facility] Pager [phone]  If 7PM-7AM, please contact night-coverage [email or web address] Password [facility] 07/01/2013, 12:38 PM    LOS: 5 days

## 2013-07-01 NOTE — Progress Notes (Signed)
4 Days Post-Op  Subjective: Pt doing ok; had some dysuria during the night and into early am; has voided blood tinged urine today; denies sig flank pain or nausea  Objective: Vital signs in last 24 hours: Temp:  [98.6 F (37 C)-99.5 F (37.5 C)] 98.6 F (37 C) (03/21 0457) Pulse Rate:  [88-93] 93 (03/21 0457) Resp:  [18-20] 18 (03/21 0457) BP: (106-129)/(51-69) 129/51 mmHg (03/21 0457) SpO2:  [93 %-98 %] 95 % (03/21 0849) Last BM Date: 06/26/13  Intake/Output from previous day: 03/20 0701 - 03/21 0700 In: 825.5 [P.O.:680; I.V.:85.5; IV Piggyback:50] Out: 1500 [Urine:1500] Intake/Output this shift: Total I/O In: 240 [P.O.:240] Out: -   Right PCN intact, capped overnight; insertion site ok, minimal tenderness; PCN flushed with 10 cc's sterile NS with return of blood tinged urine; placed back to gravity bag drainage  Lab Results:   Recent Labs  06/30/13 1355 07/01/13 0427  WBC 4.6 3.8*  HGB 10.4* 9.1*  HCT 31.4* 28.1*  PLT 148* 138*   BMET  Recent Labs  06/30/13 0353 07/01/13 0427  NA 138 139  K 3.3* 3.4*  CL 101 100  CO2 23 26  GLUCOSE 121* 132*  BUN 24* 22  CREATININE 1.87* 1.71*  CALCIUM 8.8 9.3   PT/INR  Recent Labs  06/30/13 0353 07/01/13 0427  LABPROT 17.1* 17.1*  INR 1.43 1.43   ABG No results found for this basename: PHART, PCO2, PO2, HCO3,  in the last 72 hours  Studies/Results: No results found.  Anti-infectives: Anti-infectives   Start     Dose/Rate Route Frequency Ordered Stop   06/27/13 2045  cefTRIAXone (ROCEPHIN) 1 g in dextrose 5 % 50 mL IVPB     1 g 100 mL/hr over 30 Minutes Intravenous Every 24 hours 06/26/13 2134     06/26/13 2045  cefTRIAXone (ROCEPHIN) 1 g in dextrose 5 % 50 mL IVPB     1 g 100 mL/hr over 30 Minutes Intravenous  Once 06/26/13 2031 06/26/13 2156      Assessment/Plan: s/p  right PCN 3/17 secondary to obstructive hydronephrosis from NHL;urosepsis; renal insuff; coumadin/heparin stopped 3/20 secondary to  gross hematuria- hold until urine clears- may need to d/w pts cardiologist (Dr. Cristopher Peru) if necessary to hold more than few days ; urine now blood-tinged; creat sl lower at 1.7; cont current tx, monitor labs/ urine output closely; await urology f/u; for eventual attempt at ureteral stent as OP; urelle for dysuria per primary   LOS: 5 days    ALLRED,D [facility] 07/01/2013

## 2013-07-01 NOTE — Progress Notes (Signed)
Per RN, Pt not ready for d/c today.  May be ready tomorrow.  Facility aware.  CSW to continue to follow.  Bernita Raisin, [place] [facility] [phone]

## 2013-07-01 NOTE — Progress Notes (Signed)
[place] for IV heparin / Coumadin Indication: Atrial fibrillation  Allergies  Allergen Reactions  . Codeine Nausea Only  . Penicillins     Unknown    Patient Measurements: Height: 4\' 8"  (142.2 cm) Weight: 234 lb 2.1 oz (106.2 kg) IBW/kg (Calculated) : 36.3 Heparin Dosing Weight: 71kg  Vital Signs: Temp: 98.6 F (37 C) (03/21 0457) Temp src: Oral (03/21 0457) BP: 129/51 mmHg (03/21 0457) Pulse Rate: 93 (03/21 0457)  Labs:  Recent Labs  06/29/13 0500 06/29/13 1250 06/29/13 2048 06/30/13 0353 06/30/13 1355 07/01/13 0427  HGB 9.1*  --   --  10.0* 10.4* 9.1*  HCT 28.0*  --   --  30.1* 31.4* 28.1*  PLT 126*  --   --  138* 148* 138*  LABPROT 15.4*  --   --  17.1*  --  17.1*  INR 1.25  --   --  1.43  --  1.43  HEPARINUNFRC 0.54 0.65 0.61 0.68  --   --   CREATININE 1.92*  --   --  1.87*  --  1.71*    Estimated Creatinine Clearance: 30.2 ml/min (by C-G formula based on Cr of 1.71).   Assessment: 65 yoF with PMHx DLBCL, DM, Afib on chronic coumadin, and hydronephrosis presents with severe sepsis 2/2 complicated UTI/acute pyelonephritis, Afib, and A/CKD. Placed on CTX pending culture results. Pt had planned stent placement 3/17 for obstructive hydronephrosis, however cancelled due to not feeling well. After admitted to [facility], procedure scheduled inpt however pt unstable for anesthesia, therefore performed via IR with perc placement. Pharmacy consulted to dose IV heparin / Warfarin bridge post-op per pharmacy, heparin to start ~1800, warfarin to start 3/18. Per Dr. Pascal Lux (IR), uncomplicated procedure.    Heparin and warfarin held and remain off since 3/20 AM when urine in R nephrostomy became increasingly bloody.  Rn reports that today, 3/21, that urine is no longer frank red, but is a brown color.  No HL this AM since heparin is off;  INR 1.43  CBC: Hgb decreased to 9.1, Plt 138  Warfarin doses administered inpatient so far: 5mg ,  7.5mg  (last on 3/19)  Home warfarin dose is 5 mg daily except 2.5 mg Tuesdays/Thursdays.  Goal of Therapy:  INR 2-3 Heparin level 0.3-0.7 units/ml Monitor platelets by anticoagulation protocol: Yes   Plan:  Heparin and Warfarin remain on hold.  Continue to follow plans for anticoagulation per MD.   Gretta Arab PharmD, BCPS Pager [phone] 07/01/2013 7:41 AM

## 2013-07-02 LAB — BASIC METABOLIC PANEL
BUN: 19 mg/dL (ref 6–23)
CO2: 27 meq/L (ref 19–32)
CREATININE: 1.57 mg/dL — AB (ref 0.50–1.10)
Calcium: 9.5 mg/dL (ref 8.4–10.5)
Chloride: 100 mEq/L (ref 96–112)
GFR calc Af Amer: 37 mL/min — ABNORMAL LOW (ref >90)
GFR, EST NON AFRICAN AMERICAN: 32 mL/min — AB (ref >90)
GLUCOSE: 115 mg/dL — AB (ref 70–99)
Potassium: 3.6 mEq/L — ABNORMAL LOW (ref 3.7–5.3)
Sodium: 138 mEq/L (ref 137–147)

## 2013-07-02 LAB — GLUCOSE, CAPILLARY
Glucose-Capillary: 105 mg/dL — ABNORMAL HIGH (ref 70–99)
Glucose-Capillary: 152 mg/dL — ABNORMAL HIGH (ref 70–99)
Glucose-Capillary: 131 mg/dL — ABNORMAL HIGH (ref 70–99)
Glucose-Capillary: 158 mg/dL — ABNORMAL HIGH (ref 70–99)

## 2013-07-02 LAB — CBC
HEMATOCRIT: 28.4 % — AB (ref 36.0–46.0)
Hemoglobin: 9.4 g/dL — ABNORMAL LOW (ref 12.0–15.0)
MCH: 30.1 pg (ref 26.0–34.0)
MCHC: 33.1 g/dL (ref 30.0–36.0)
MCV: 91 fL (ref 78.0–100.0)
Platelets: 161 10*3/uL (ref 150–400)
RBC: 3.12 MIL/uL — AB (ref 3.87–5.11)
RDW: 15.4 % (ref 11.5–15.5)
WBC: 3.8 10*3/uL — ABNORMAL LOW (ref 4.0–10.5)

## 2013-07-02 LAB — PROTIME-INR
INR: 1.39 (ref 0.00–1.49)
PROTHROMBIN TIME: 16.7 s — AB (ref 11.6–15.2)

## 2013-07-02 MED ORDER — LEVOFLOXACIN 750 MG PO TABS
750.0000 mg | ORAL_TABLET | ORAL | Status: DC
  Administered 2013-07-02: 750 mg via ORAL
  Filled 2013-07-02: qty 1

## 2013-07-02 MED ORDER — LEVOFLOXACIN IN D5W 750 MG/150ML IV SOLN
750.0000 mg | INTRAVENOUS | Status: DC
  Filled 2013-07-02: qty 150

## 2013-07-02 NOTE — Progress Notes (Signed)
ANTIBIOTIC CONSULT NOTE - INITIAL  Pharmacy Consult for Levaquin Indication: UTI  Allergies  Allergen Reactions  . Codeine Nausea Only  . Penicillins     Unknown   Patient Measurements: Height: 4\' 8"  (142.2 cm) Weight: 234 lb 2.1 oz (106.2 kg) IBW/kg (Calculated) : 36.3  Vital Signs: Temp: 98.2 F (36.8 C) (03/22 0514) Temp src: Oral (03/22 0514) BP: 109/58 mmHg (03/22 0514) Pulse Rate: 111 (03/22 0514) Intake/Output from previous day: 03/21 0701 - 03/22 0700 In: 530 [P.O.:480; IV Piggyback:50] Out: 785 [Urine:785] Intake/Output from this shift: Total I/O In: 120 [P.O.:120] Out: 825 [Urine:825]  Labs:  Recent Labs  06/30/13 0353 06/30/13 1355 07/01/13 0427 07/02/13 0438  WBC 3.4* 4.6 3.8* 3.8*  HGB 10.0* 10.4* 9.1* 9.4*  PLT 138* 148* 138* 161  CREATININE 1.87*  --  1.71* 1.57*   Estimated Creatinine Clearance: 32.9 ml/min (by C-G formula based on Cr of 1.57). No results found for this basename: Letta Median, VANCORANDOM, GENTTROUGH, GENTPEAK, GENTRANDOM, TOBRATROUGH, TOBRAPEAK, TOBRARND, AMIKACINPEAK, AMIKACINTROU, AMIKACIN,  in the last 72 hours   Microbiology: Recent Results (from the past 720 hour(s))  URINE CULTURE     Status: None   Collection Time    06/26/13  6:23 PM      Result Value Ref Range Status   Specimen Description URINE, CATHETERIZED   Final   Special Requests NONE   Final   Culture  Setup Time     Final   Value: 06/27/2013 00:13     Performed at [place]     Final   Value: >=100,000 COLONIES/ML     Performed at [facility]   Culture     Final   Value: [place]     Performed at [facility]   Report Status 06/30/2013 FINAL   Final   Organism ID, Bacteria KLEBSIELLA PNEUMONIAE   Final   Organism ID, Bacteria CITROBACTER FREUNDII   Final  MRSA PCR SCREENING     Status: None   Collection Time    06/26/13 11:19 PM      Result Value Ref Range  Status   MRSA by PCR NEGATIVE  NEGATIVE Final   Comment:            The GeneXpert MRSA Assay (FDA     approved for NASAL specimens     only), is one component of a     comprehensive MRSA colonization     surveillance program. It is not     intended to diagnose MRSA     infection nor to guide or     monitor treatment for     MRSA infections.   Medical History: Past Medical History  Diagnosis Date  . Sick sinus syndrome   . Hematoma     At the site of the pacemaker insertion.  . Hypotension   . Dyslipidemia   . History of atrial fibrillation   . Arthritis     Osteoarthritis  . Port-a-cath in place 12/08/10  . Status post chemotherapy completed 03/2011    R - [facility]OP q 3 weeks x 6  . S/P radiation therapy 05/28/2011 - 06/26/2011    Right Abdomen and Right Pelvis/3060 cGy in 17 Fractions  . Hypertension   . ASHD (arteriosclerotic heart disease)   . Gout   . COPD (chronic obstructive pulmonary disease)   . Asthma   . Hyperlipidemia   . Diabetes mellitus   .  Hypothyroidism   . Diffuse large B cell lymphoma     Dr. Chisom,oncology  . Cancer 10/2010    large B cell lymphoma  . 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 Pacemaker     left chest  . Dysrhythmia     Hx. A. Fib, sick sinus syndrome- Pacemaker inserted.  . Cataracts, bilateral    Medications:  Scheduled:  . allopurinol  300 mg Oral Daily  . atorvastatin  20 mg Oral q1800  . citalopram  20 mg Oral q morning - 10a  . diltiazem  180 mg Oral Daily  . guaiFENesin  600 mg Oral BID  . insulin aspart  0-5 Units Subcutaneous QHS  . insulin aspart  0-9 Units Subcutaneous TID WC  . levofloxacin (LEVAQUIN) IV  750 mg Intravenous Q48H  . levothyroxine  50 mcg Oral QAC breakfast  . magnesium oxide  200 mg Oral Daily  . polyethylene glycol  17 g Oral Daily  . senna  2 tablet Oral Daily  . sodium chloride  3 mL Intravenous Q12H  . tiotropium  18 mcg Inhalation Daily  . URELLE  1 tablet  Oral TID  . Warfarin - Pharmacist Dosing Inpatient   Does not apply q1800   Anti-infectives   Start     Dose/Rate Route Frequency Ordered Stop   07/02/13 1400  levofloxacin (LEVAQUIN) IVPB 750 mg     750 mg 100 mL/hr over 90 Minutes Intravenous Every 48 hours 07/02/13 1303     06/27/13 2045  cefTRIAXone (ROCEPHIN) 1 g in dextrose 5 % 50 mL IVPB  Status:  Discontinued     1 g 100 mL/hr over 30 Minutes Intravenous Every 24 hours 06/26/13 2134 07/02/13 1212   06/26/13 2045  cefTRIAXone (ROCEPHIN) 1 g in dextrose 5 % 50 mL IVPB     1 g 100 mL/hr over 30 Minutes Intravenous  Once 06/26/13 2031 06/26/13 2156     Assessment: 72 yoF obstructive R hydronephrosis secondary to lymphoma, planned cysto and stent 3/17, but admitted 3/16 septic and in Afib. R percutaneous nephrostomy tube place. Completed 6 days Ceftriaxone for Citrobacter/Klebsiella pyelonephritis.  Abx change to Levaquin po to complete 10-14 days therapy.  Renal function: Clearance < 50 ml/min  Goal of Therapy:  Antibiotic, dose, schedule appropriate for therapy, renal function  Plan:   Levaquin 750mg  po q48h  Minda Ditto PharmD Pager [phone] 07/02/2013, 1:16 PM

## 2013-07-02 NOTE — Progress Notes (Signed)
Patient ID: [name], female   DOB: 04/03/1941, 73 y.o.   MRN: [id number] No complaints. Sitting in chair eating breakfast.  Filed Vitals:   07/02/13 0514  BP: 109/58  Pulse: 111  Temp: 98.2 F (36.8 C)  Resp: 18    PE: NAD Looks well Rt Nx clear urine   CBC    Component Value Date/Time   WBC 3.8* 07/02/2013 0438   WBC 3.1* 06/12/2013 1425   RBC 3.12* 07/02/2013 0438   RBC 3.44* 06/12/2013 1425   HGB 9.4* 07/02/2013 0438   HGB 10.6* 06/12/2013 1425   HCT 28.4* 07/02/2013 0438   HCT 32.9* 06/12/2013 1425   PLT 161 07/02/2013 0438   PLT 281 06/12/2013 1425   MCV 91.0 07/02/2013 0438   MCV 95.6 06/12/2013 1425   MCH 30.1 07/02/2013 0438   MCH 30.7 06/12/2013 1425   MCHC 33.1 07/02/2013 0438   MCHC 32.1 06/12/2013 1425   RDW 15.4 07/02/2013 0438   RDW 17.3* 06/12/2013 1425   LYMPHSABS 0.7 06/26/2013 1635   LYMPHSABS 1.1 06/12/2013 1425   MONOABS 1.5* 06/26/2013 1635   MONOABS 0.6 06/12/2013 1425   EOSABS 0.0 06/26/2013 1635   EOSABS 0.1 06/12/2013 1425   BASOSABS 0.0 06/26/2013 1635   BASOSABS 0.0 06/12/2013 1425    BMET    Component Value Date/Time   NA 138 07/02/2013 0438   NA 141 06/12/2013 1425   NA 141 10/12/2011 0957   K 3.6* 07/02/2013 0438   K 4.5 06/12/2013 1425   K 4.4 10/12/2011 0957   CL 100 07/02/2013 0438   CL 102 09/21/2012 0917   CL 95* 10/12/2011 0957   CO2 27 07/02/2013 0438   CO2 28 06/12/2013 1425   CO2 29 10/12/2011 0957   GLUCOSE 115* 07/02/2013 0438   GLUCOSE 101 06/12/2013 1425   GLUCOSE 161* 09/21/2012 0917   GLUCOSE 131* 10/12/2011 0957   BUN 19 07/02/2013 0438   BUN 21.6 06/12/2013 1425   BUN 15 10/12/2011 0957   CREATININE 1.57* 07/02/2013 0438   CREATININE 1.7* 06/12/2013 1425   CREATININE 1.10 04/19/2013 1420   CALCIUM 9.5 07/02/2013 0438   CALCIUM 10.4 06/12/2013 1425   CALCIUM 9.1 10/12/2011 0957   GFRNONAA 32* 07/02/2013 0438   GFRAA 37* 07/02/2013 0438    A/P -  -hydronephrosis - stable from GU point of view - internalize stent next week or two. Right Nx should never be capped  until ureteral stent placed. Leave to gravity drainage. I will sign off. Call with questions.  -sepsis - resolved - AFVSS. Sensitivities finalized.    Georgette Dover, MD [facility] Pager [phone]

## 2013-07-02 NOTE — Progress Notes (Signed)
PROGRESS NOTE    [name] EYC:[id number] DOB: [birth year] DOA: 06/26/2013 PCP: [name], MD Primary cardiologist: Dr. Cristopher Peru Primary oncologist: Dr. Concha Norway  HPI/Brief narrative 73 year old female with history of sick sinus syndrome, status post PPM, dyslipidemia, PAF on chronic Coumadin, hypertension, COPD, type II DM, hyperlipidemia, hypothyroid, recurrent NHL, with obstructive right hydronephrosis secondary to lymphoma, was planned for elective cystoscopy and right ureteral stent on 3/17 but admitted on 3/16 with sepsis likely secondary to UTI/pyelonephritis, delirium and rapid A. fib. He was admitted to step down unit. Since patient not stable for ureteral stent, urology recommending right percutaneous nephrostomy by IR. Coumadin had been held in case she would need a nephrostomy and not for cystoscopy/stent.   Assessment/Plan:  1. Severe sepsis secondary to complicated UTI(Klebsiella, Citrobacter)/acute pyelonephritis: Treated with IV fluids and continue IV Rocephin- sensitive. Leukocytosis resolved. Clinically looks better. Low-grade fevers- ? Due to NHL. Start Urelle for dysuria. Completed 6 days of IV Rocephin- transition to PO Levaquin to complete 10-14 days 2. Obstructive right hydronephrosis: Secondary to recurrent NHL. Status post right percutaneous nephrostomy on 3/17. Creatinine slowly improving. Urology followup appreciated. Discussed with IR 3/20-gross hematuria and blood clots in the drainage bag-recommended holding anticoagulation & capped nephrostomy. Management per urology and IR. Discussed with Dr. Has, IR on 3/22: hold Ac for additional day until hematuria clears. Will DW Dr. Cristopher Peru re [facility] in am. 3. A. fib with RVR: Patient was initially treated with IV Cardizem drip and then transitioned to home oral Cardizem on 3/18. Controlled ventricular rate. Anticoagulation held 3/20 secondary to hematuria-resume when cleared by IR. 4. Acute on chronic  kidney disease: Creatinine in January was in the 1.3 range. Now likely worsened by sepsis and hydronephrosis. Improving post hydration and right nephrostomy. Creatinine slowly improving (as per oncology creatinine has ranged from 1.1-1.6 over the past year). Trend daily BMP. 5. Delirium: Likely secondary to infection. Resolved. 6. Chronic anemia: Stable. Transfuse if hemoglobin less than 7 g per DL. 7. Status post PPM 8. Hypertension: Patient running soft blood pressures. 9. COPD: Stable 10. Type II DM uncontrolled. Continue SSI. 11. Recurrent NHL: Oncology input appreciated. 12. Hypokalemia: Replace and follow. 13. Constipation: Bowel regimen started 3/21  Code Status: Full Family Communication: discussed with patients 2 nieces/HCPOA's st bedside on 3/21 Disposition Plan: DC to SNF when medically stable. Not medically ready for DC.   Consultants:  Urology  Interventional radiology  Medical oncology  Procedures:  Percutaneous right nephrostomy 3/17  Antibiotics:  IV Rocephin 3/16 >   Subjective: Dysuria better. No BM yet.  Objective: Filed Vitals:   07/01/13 2122 07/01/13 2145 07/02/13 0514 07/02/13 0846  BP: 128/68 169/71 109/58   Pulse: 89 90 111   Temp: 97.8 F (36.6 C) 98.5 F (36.9 C) 98.2 F (36.8 C)   TempSrc: Oral Oral Oral   Resp: 18 18 18    Height:      Weight:      SpO2: 100% 95% 99% 98%   blood pressure 100/58  Intake/Output Summary (Last 24 hours) at 07/02/13 1203 Last data filed at 07/02/13 1010  Gross per 24 hour  Intake    290 ml  Output   1610 ml  Net  -1320 ml   Filed Weights   06/26/13 2300 06/28/13 0400  Weight: 103.6 kg (228 lb 6.3 oz) 106.2 kg (234 lb 2.1 oz)     Exam:  General exam: Elderly female was seen sitting comfortably on chair this morning. Respiratory  system: Clear. No increased work of breathing. Cardiovascular system: S1 & S2 heard, irregularly irregular. No JVD, murmurs, gallops, clicks or pedal edema.  Gastrointestinal system: Abdomen is nondistended, soft and nontender. Normal bowel sounds heard. Right percutaneous nephrostomy - urine in bag minimally blood tinged. Central nervous system: Alert and oriented. No focal neurological deficits. Extremities: Symmetric 5 x 5 power.   Data Reviewed: Basic Metabolic Panel:  Recent Labs Lab 06/28/13 0030 06/29/13 0500 06/30/13 0353 07/01/13 0427 07/02/13 0438  NA 135* 136* 138 139 138  K 3.6* 3.5* 3.3* 3.4* 3.6*  CL 99 101 101 100 100  CO2 23 22 23 26 27   GLUCOSE 121* 127* 121* 132* 115*  BUN 23 25* 24* 22 19  CREATININE 1.87* 1.92* 1.87* 1.71* 1.57*  CALCIUM 8.6 8.6 8.8 9.3 9.5   Liver Function Tests:  Recent Labs Lab 06/26/13 1635  AST 17  ALT 11  ALKPHOS 54  BILITOT 0.4  PROT 6.7  ALBUMIN 3.5   No results found for this basename: LIPASE, AMYLASE,  in the last 168 hours No results found for this basename: AMMONIA,  in the last 168 hours CBC:  Recent Labs Lab 06/26/13 1635  06/29/13 0500 06/30/13 0353 06/30/13 1355 07/01/13 0427 07/02/13 0438  WBC 11.4*  < > 5.3 3.4* 4.6 3.8* 3.8*  NEUTROABS 9.3*  --   --   --   --   --   --   HGB 11.7*  < > 9.1* 10.0* 10.4* 9.1* 9.4*  HCT 36.9  < > 28.0* 30.1* 31.4* 28.1* 28.4*  MCV 95.6  < > 93.3 91.8 91.8 91.8 91.0  PLT 185  < > 126* 138* 148* 138* 161  < > = values in this interval not displayed. Cardiac Enzymes:  Recent Labs Lab 06/26/13 1635  TROPONINI <0.30   BNP (last 3 results) No results found for this basename: PROBNP,  in the last 8760 hours CBG:  Recent Labs Lab 07/01/13 0746 07/01/13 1146 07/01/13 1712 07/01/13 2128 07/02/13 0838  GLUCAP 132* 152* 152* 202* 105*    Recent Results (from the past 240 hour(s))  URINE CULTURE     Status: None   Collection Time    06/26/13  6:23 PM      Result Value Ref Range Status   Specimen Description URINE, CATHETERIZED   Final   Special Requests NONE   Final   Culture  Setup Time     Final   Value:  06/27/2013 00:13     Performed at [place]     Final   Value: >=100,000 COLONIES/ML     Performed at [facility]   Culture     Final   Value: [place]     Performed at [facility]   Report Status 06/30/2013 FINAL   Final   Organism ID, Bacteria KLEBSIELLA PNEUMONIAE   Final   Organism ID, Bacteria CITROBACTER FREUNDII   Final  MRSA PCR SCREENING     Status: None   Collection Time    06/26/13 11:19 PM      Result Value Ref Range Status   MRSA by PCR NEGATIVE  NEGATIVE Final   Comment:            The GeneXpert MRSA Assay (FDA     approved for NASAL specimens     only), is one component of a     comprehensive MRSA colonization  surveillance program. It is not     intended to diagnose MRSA     infection nor to guide or     monitor treatment for     MRSA infections.       Studies: No results found.      Scheduled Meds: . allopurinol  300 mg Oral Daily  . atorvastatin  20 mg Oral q1800  . cefTRIAXone (ROCEPHIN)  IV  1 g Intravenous Q24H  . citalopram  20 mg Oral q morning - 10a  . diltiazem  180 mg Oral Daily  . guaiFENesin  600 mg Oral BID  . insulin aspart  0-5 Units Subcutaneous QHS  . insulin aspart  0-9 Units Subcutaneous TID WC  . levothyroxine  50 mcg Oral QAC breakfast  . magnesium oxide  200 mg Oral Daily  . polyethylene glycol  17 g Oral Daily  . senna  2 tablet Oral Daily  . sodium chloride  3 mL Intravenous Q12H  . tiotropium  18 mcg Inhalation Daily  . URELLE  1 tablet Oral TID  . Warfarin - Pharmacist Dosing Inpatient   Does not apply q1800   Continuous Infusions:    Principal Problem:   Severe sepsis Active Problems:   Essential hypertension, benign   Diffuse large B cell lymphoma   Diabetes mellitus type 2 in obese   Atrial fibrillation with RVR   Hydronephrosis, right   UTI (lower urinary tract infection)   Sepsis secondary to UTI    Time spent: 65  minutes    HONGALGI,ANAND, MD, FACP, FHM. [facility] Pager [phone]  If 7PM-7AM, please contact night-coverage [email or web address] Password [email or web address] 07/02/2013, 12:03 PM    LOS: 6 days

## 2013-07-02 NOTE — Progress Notes (Signed)
5 Days Post-Op  Subjective: Pt without new c/o ; hematuria essentially resolved with only minimal blood tinged urine noted  Objective: Vital signs in last 24 hours: Temp:  [97.6 F (36.4 C)-98.5 F (36.9 C)] 98.2 F (36.8 C) (03/22 0514) Pulse Rate:  [85-111] 111 (03/22 0514) Resp:  [18-20] 18 (03/22 0514) BP: (105-169)/(58-71) 109/58 mmHg (03/22 0514) SpO2:  [95 %-100 %] 98 % (03/22 0846) Last BM Date: 06/26/13  Intake/Output from previous day: 03/21 0701 - 03/22 0700 In: 530 [P.O.:480; IV Piggyback:50] Out: 785 [Urine:785] Intake/Output this shift: Total I/O In: 120 [P.O.:120] Out: 825 [Urine:825]  Right PCN intact, insertion site ok, output 200 + cc's essentially clear urine (minimal blood tinged); PCN flushed with 10 cc's sterile NS without difficulty  Lab Results:   Recent Labs  07/01/13 0427 07/02/13 0438  WBC 3.8* 3.8*  HGB 9.1* 9.4*  HCT 28.1* 28.4*  PLT 138* 161   BMET  Recent Labs  07/01/13 0427 07/02/13 0438  NA 139 138  K 3.4* 3.6*  CL 100 100  CO2 26 27  GLUCOSE 132* 115*  BUN 22 19  CREATININE 1.71* 1.57*  CALCIUM 9.3 9.5   PT/INR  Recent Labs  07/01/13 0427 07/02/13 0438  LABPROT 17.1* 16.7*  INR 1.43 1.39   ABG No results found for this basename: PHART, PCO2, PO2, HCO3,  in the last 72 hours  Studies/Results: No results found.  Anti-infectives: Anti-infectives   Start     Dose/Rate Route Frequency Ordered Stop   06/27/13 2045  cefTRIAXone (ROCEPHIN) 1 g in dextrose 5 % 50 mL IVPB  Status:  Discontinued     1 g 100 mL/hr over 30 Minutes Intravenous Every 24 hours 06/26/13 2134 07/02/13 1212   06/26/13 2045  cefTRIAXone (ROCEPHIN) 1 g in dextrose 5 % 50 mL IVPB     1 g 100 mL/hr over 30 Minutes Intravenous  Once 06/26/13 2031 06/26/13 2156      Assessment/Plan: s/p  right PCN 3/17 secondary to obstructive hydronephrosis from NHL;urosepsis; renal insuff; coumadin/heparin stopped 3/20 secondary to gross hematuria- currently  draining minimally blood tinged urine- per Dr. Barbie Banner may resume coumadin on 3/23 if no further hematuria occurs and ok with Dr. Lovena Le; ureteral stenting as OP  LOS: 6 days    [name],D [facility] 07/02/2013

## 2013-07-02 NOTE — Progress Notes (Signed)
Per MD, Pt not ready for d/c today.  Facility aware.  Weekday CSW to follow.  Bernita Raisin, [place] [facility] [phone]

## 2013-07-03 LAB — BASIC METABOLIC PANEL
BUN: 18 mg/dL (ref 6–23)
CALCIUM: 9.9 mg/dL (ref 8.4–10.5)
CO2: 28 mEq/L (ref 19–32)
CREATININE: 1.64 mg/dL — AB (ref 0.50–1.10)
Chloride: 100 mEq/L (ref 96–112)
GFR, EST AFRICAN AMERICAN: 35 mL/min — AB (ref >90)
GFR, EST NON AFRICAN AMERICAN: 30 mL/min — AB (ref >90)
Glucose, Bld: 134 mg/dL — ABNORMAL HIGH (ref 70–99)
POTASSIUM: 3.5 meq/L — AB (ref 3.7–5.3)
Sodium: 141 mEq/L (ref 137–147)

## 2013-07-03 LAB — GLUCOSE, CAPILLARY
GLUCOSE-CAPILLARY: 131 mg/dL — AB (ref 70–99)
Glucose-Capillary: 130 mg/dL — ABNORMAL HIGH (ref 70–99)

## 2013-07-03 LAB — PROTIME-INR
INR: 1.36 (ref 0.00–1.49)
PROTHROMBIN TIME: 16.4 s — AB (ref 11.6–15.2)

## 2013-07-03 MED ORDER — WARFARIN SODIUM 7.5 MG PO TABS
7.5000 mg | ORAL_TABLET | Freq: Once | ORAL | Status: AC
  Administered 2013-07-03: 7.5 mg via ORAL
  Filled 2013-07-03: qty 1

## 2013-07-03 MED ORDER — URELLE 81 MG PO TABS: 1.0000 | ORAL_TABLET | Freq: Three times a day (TID) | ORAL | Status: DC | PRN

## 2013-07-03 MED ORDER — WARFARIN SODIUM 5 MG PO TABS: 5.0000 mg | ORAL_TABLET | Freq: Every day | ORAL | Status: DC

## 2013-07-03 MED ORDER — SENNA 8.6 MG PO TABS: 2.0000 | ORAL_TABLET | Freq: Every day | ORAL | Status: DC

## 2013-07-03 MED ORDER — POLYETHYLENE GLYCOL 3350 17 G PO PACK: 17.0000 g | PACK | Freq: Every day | ORAL | Status: DC

## 2013-07-03 MED ORDER — LEVOFLOXACIN 750 MG PO TABS: 750.0000 mg | ORAL_TABLET | ORAL | Status: DC

## 2013-07-03 NOTE — Progress Notes (Signed)
6 Days Post-Op  Subjective: Pt without new c/o ; hematuria essentially resolved with only minimal blood tinged urine noted  Objective: Vital signs in last 24 hours: Temp:  [98.1 F (36.7 C)-98.3 F (36.8 C)] 98.3 F (36.8 C) (03/23 0548) Pulse Rate:  [75-78] 75 (03/23 0548) Resp:  [18] 18 (03/23 0548) BP: (105-115)/(53-89) 115/53 mmHg (03/23 0548) SpO2:  [97 %-100 %] 98 % (03/23 0926) Last BM Date: 07/02/13   Right PCN intact, insertion site ok, 875cc output yesterday About 300cc in bag today, essentially clear, no stranding.  Lab Results:   Recent Labs  07/01/13 0427 07/02/13 0438  WBC 3.8* 3.8*  HGB 9.1* 9.4*  HCT 28.1* 28.4*  PLT 138* 161   BMET  Recent Labs  07/02/13 0438 07/03/13 0426  NA 138 141  K 3.6* 3.5*  CL 100 100  CO2 27 28  GLUCOSE 115* 134*  BUN 19 18  CREATININE 1.57* 1.64*  CALCIUM 9.5 9.9   PT/INR  Recent Labs  07/02/13 0438 07/03/13 0426  LABPROT 16.7* 16.4*  INR 1.39 1.36   ABG No results found for this basename: PHART, PCO2, PO2, HCO3,  in the last 72 hours  Studies/Results: No results found.  Anti-infectives: Anti-infectives   Start     Dose/Rate Route Frequency Ordered Stop   07/02/13 1400  levofloxacin (LEVAQUIN) IVPB 750 mg  Status:  Discontinued     750 mg 100 mL/hr over 90 Minutes Intravenous Every 48 hours 07/02/13 1303 07/02/13 1312   07/02/13 1400  levofloxacin (LEVAQUIN) tablet 750 mg     750 mg Oral Every 48 hours 07/02/13 1312     06/27/13 2045  cefTRIAXone (ROCEPHIN) 1 g in dextrose 5 % 50 mL IVPB  Status:  Discontinued     1 g 100 mL/hr over 30 Minutes Intravenous Every 24 hours 06/26/13 2134 07/02/13 1212   06/26/13 2045  cefTRIAXone (ROCEPHIN) 1 g in dextrose 5 % 50 mL IVPB     1 g 100 mL/hr over 30 Minutes Intravenous  Once 06/26/13 2031 06/26/13 2156      Assessment/Plan: s/p  right PCN 3/17 secondary to obstructive hydronephrosis from NHL;urosepsis; renal insuff; OK to resume Coumadin Recommend  daily flush with 5cc NS while at skilled facility. [facility] IR will contact pt/facility to schedule/coordinate plan for stent in the next few weeks. Coumadin will need to be held approx 4 days prior to that procedure.  LOS: 7 days    [name] 07/03/2013

## 2013-07-03 NOTE — Progress Notes (Addendum)
CSW sent updated PT notes to [employer] as patient was not discharged over weekend. CSW met with patient and niece at bedside. They are still agreeable to [facility].   C.  MSW, LCSW [phone] [facility] is ready for patient. Packet copied and placed in [facility]. ptar called for transportation.   C.  MSW, LCSW [phone]  

## 2013-07-03 NOTE — Progress Notes (Signed)
[place] for Coumadin Indication: Atrial fibrillation  Allergies  Allergen Reactions  . Codeine Nausea Only  . Penicillins     Unknown    Patient Measurements: Height: 4\' 8"  (142.2 cm) Weight: 234 lb 2.1 oz (106.2 kg) IBW/kg (Calculated) : 36.3 Heparin Dosing Weight: 71kg  Vital Signs: Temp: 98.3 F (36.8 C) (03/23 0548) Temp src: Oral (03/23 0548) BP: 115/53 mmHg (03/23 0548) Pulse Rate: 75 (03/23 0548)  Labs:  Recent Labs  06/30/13 1355 07/01/13 0427 07/02/13 0438 07/03/13 0426  HGB 10.4* 9.1* 9.4*  --   HCT 31.4* 28.1* 28.4*  --   PLT 148* 138* 161  --   LABPROT  --  17.1* 16.7* 16.4*  INR  --  1.43 1.39 1.36  CREATININE  --  1.71* 1.57* 1.64*    Estimated Creatinine Clearance: 31.5 ml/min (by C-G formula based on Cr of 1.64).   Assessment: 11 yoF with PMHx DLBCL, DM, Afib on chronic Coumadin, and hydronephrosis presents with severe sepsis 2/2 complicated UTI/acute pyelonephritis, Afib, and A/CKD. Pharmacy asked to dose heparin and Coumadin. 3/18: Stent placement in IR for obstructive hydronephrosis.  3/20: Heparin and warfarin held when urine in R nephrostomy became increasingly bloody. 3/23: Resuming Coumadin with no bridging. INR subtherapeutic, has trended down only slightly(last Coumadin given on 3/19).  CBC 3/22 stable.  Urine reportedly minimally blood tinged.  Tolerating diet.  Levaquin started 3/22 - can increase sensitivity to Coumadin.  Home warfarin dose was 5mg  daily except 2.5mg  on Tuesdays & Thursdays.  Goal of Therapy:  INR: 2-3 Monitor platelets by anticoagulation protocol: Yes   Plan:  Give Coumadin 7.5mg  today.  Check INR daily.  CBC in am.  Discharge dose possibly 5mg  daily although it is hard to predict. INR was subtherapeutic on home regimen listed above but if Levaquin continues she could be more sensitive.  Romeo Rabon, PharmD, pager [phone]. 07/03/2013,11:48 AM.

## 2013-07-03 NOTE — Progress Notes (Signed)
Report called to [facility]. H&P and discharge reviewed. All questions answered. Callie Fielding RN

## 2013-07-03 NOTE — Discharge Summary (Addendum)
Physician Discharge Summary  [name] [id number] DOB: [birth year] DOA: 06/26/2013  PCP: [name], MD Primary Cardiologist: Dr. Cristopher Peru  Primary Oncologist: Dr. Concha Norway Primary Urologist: Dr. Festus Aloe   Admit date: 06/26/2013 Discharge date: 07/03/2013  Time spent: Greater than 30 minutes  Recommendations for Outpatient Follow-up:  1. MD at SNF in 3 days. To be seen with repeat labs (CBC, BMP, PT & INR) on 07/05/13 and then as deemed necessary for Coumadin dose adjustment and followup of anemia and renal functions. 2. Recommend CBG checks 3 times a day a.c. and at bedtime. 3. Dr. Unk Pinto, PCP on discharge from SNF. 4. Dr. Festus Aloe, Urology 5. Interventional radiology will contact patient/facility to schedule/coordinate plan for stent in the next few weeks and Coumadin will need to be held approximately 4 days prior to that procedure. 6. Dr. Concha Norway, Oncology  Discharge Diagnoses:  Principal Problem:   Severe sepsis Active Problems:   Essential hypertension, benign   Diffuse large B cell lymphoma   Diabetes mellitus type 2 in obese   Atrial fibrillation with RVR   Hydronephrosis, right   UTI (lower urinary tract infection)   Sepsis secondary to UTI   Discharge Condition: Improved & Stable  Diet recommendation: Heart healthy and diabetic diet.  Filed Weights   06/26/13 2300 06/28/13 0400  Weight: 103.6 kg (228 lb 6.3 oz) 106.2 kg (234 lb 2.1 oz)    History of present illness:  73 year old female with history of sick sinus syndrome, status post PPM, dyslipidemia, PAF on chronic Coumadin, hypertension, COPD, type II DM, hyperlipidemia, hypothyroid, recurrent NHL, with obstructive right hydronephrosis secondary to lymphoma, was planned for elective cystoscopy and right ureteral stent on 3/17 but admitted on 3/16 with sepsis likely secondary to UTI/pyelonephritis, delirium and rapid A. fib. He was admitted to step down unit.  Since patient not stable for ureteral stent, urology recommending right percutaneous nephrostomy by IR. Coumadin had been held in case she would need a nephrostomy and not for cystoscopy/stent.  Hospital Course:   1. Severe sepsis secondary to complicated UTI(Klebsiella, Citrobacter)/acute pyelonephritis: Patient was initially admitted to the step down unit and treated with IV fluids and IV Rocephin- sensitive. Patient has clinically improved with improvement in symptoms, resolved temperatures & leukocytosis. Urelle was added for dysuria and bladder spasms. He completed total 6 days of IV Rocephin on 3/22 and was transitioned to oral levofloxacin and will complete a total 14 days antibiotics on 07/08/13. 2. Obstructive right hydronephrosis: Secondary to recurrent NHL. Patient was supposed to undergo stent placement but was not stable for the procedure. She thereby underwent right percutaneous nephrostomy by interventional radiology on 06/27/13. Her creatinines have improved but plateaued in the 1.6 range. She had brief hematuria while on IV heparin and Coumadin. Anticoagulants were temporarily held and hematuria has almost resolved. As per interventional radiology recommendations, okay to resume Coumadin today. Will not bridge with heparin. Patient will need to be closely monitored at SNF for further hematuria. As per urology, right nephrostomy should never be capped until ureteral stent placed. The nephrostomy tube is to be left to gravity drainage. Discussed with patient's primary cardiologist who agreed with anticoagulation plan. 3. A. fib with RVR: Patient was initially treated with IV Cardizem drip and then transitioned to home oral Cardizem on 3/18. Controlled ventricular rate. Anticoagulation held 3/20 secondary to hematuria-resumed  3/23 . 4. Acute on chronic kidney disease: Creatinine in January was in the 1.3 range. Now likely worsened by sepsis  and hydronephrosis. Improving post hydration and right  nephrostomy. Creatinine slowly improving (as per oncology creatinine has ranged from 1.1-1.6 over the past year).  Periodically follow BMP at SNF . 5. Delirium: Likely secondary to infection. Resolved. 6. Chronic anemia: Stable. Transfuse if hemoglobin less than 7 g per DL. 7. Status post PPM 8. Hypertension: Patient running soft blood pressures. 9. COPD: Stable 10. Type II DM uncontrolled.  Metformin discontinued secondary to elevated creatinine. She was on SSI in the hospital but has required occasional minimal doses and hence will not discharge him same but continue checking CBGs and may consider medication based on control. 11. Recurrent NHL: Oncology input appreciated. 12. Hypokalemia: Replaced. 13. Constipation: Bowel regimen started 3/21  Consultants:  Urology  Interventional radiology  Medical oncology  Procedures:  Percutaneous right nephrostomy 3/17   Discharge Exam:  Complaints:  Patient denies complaints. Denies abdominal pain or dysuria. No dyspnea, palpitations or chest pain.  Filed Vitals:   07/02/13 2015 07/03/13 0548 07/03/13 0926 07/03/13 1413  BP: 106/85 115/53  106/50  Pulse: 78 75  69  Temp: 98.3 F (36.8 C) 98.3 F (36.8 C)  98.7 F (37.1 C)  TempSrc: Oral Oral  Oral  Resp: 18 18  20   Height:      Weight:      SpO2: 98% 100% 98% 96%    General exam: Elderly female seen sitting comfortably on chair this morning.  Respiratory system: Clear. No increased work of breathing.  Cardiovascular system: S1 & S2 heard, irregularly irregular. No JVD, murmurs, gallops, clicks or pedal edema. Gastrointestinal system: Abdomen is nondistended, soft and nontender. Normal bowel sounds heard. Right percutaneous nephrostomy - urine in bag almost cleared of hematuria.  Central nervous system: Alert and oriented. No focal neurological deficits.  Extremities: Symmetric 5 x 5 power   Discharge Instructions      Discharge Orders   Future Appointments Provider  Department Dept Phone   07/19/2013 3:30 PM [facility] [phone]   07/19/2013 4:00 PM [facility] Provider [facility] [phone]   02/13/2014 9:00 AM Christean Leaf [facility] [phone]   Future Orders Complete By Expires   Call MD for:  difficulty breathing, headache or visual disturbances  As directed    Call MD for:  persistant nausea and vomiting  As directed    Call MD for:  redness, tenderness, or signs of infection (pain, swelling, redness, odor or green/yellow discharge around incision site)  As directed    Call MD for:  severe uncontrolled pain  As directed    Call MD for:  temperature >100.4  As directed    Diet - low sodium heart healthy  As directed    Diet Carb Modified  As directed    Discharge instructions  As directed    Comments:     Right percutaneous nephrostomy care: Recommend daily flush with 5cc NS while at skilled facility.   Increase activity slowly  As directed        Medication List    STOP taking these medications       metFORMIN 500 MG 24 hr tablet  Commonly known as:  GLUCOPHAGE-XR     potassium chloride 10 MEQ tablet  Commonly known as:  K-DUR     prochlorperazine 10 MG tablet  Commonly known as:  COMPAZINE      TAKE these medications  albuterol (2.5 MG/3ML) 0.083% nebulizer solution  Commonly known as:  PROVENTIL  Take 3 mLs (2.5 mg total) by nebulization every 6 (six) hours as needed for shortness of breath.     allopurinol 300 MG tablet  Commonly known as:  ZYLOPRIM  Take 300 mg by mouth daily.     citalopram 20 MG tablet  Commonly known as:  CELEXA  Take 20 mg by mouth every morning.     diltiazem 180 MG 24 hr capsule  Commonly known as:  CARDIZEM CD  Take 180 mg by mouth every morning.     docusate sodium 100 MG capsule  Commonly known as:  COLACE  Take 100 mg by mouth daily as needed for  mild constipation.     guaiFENesin 600 MG 12 hr tablet  Commonly known as:  MUCINEX  Take 600 mg by mouth 2 (two) times daily.     levofloxacin 750 MG tablet  Commonly known as:  LEVAQUIN  Take 1 tablet (750 mg total) by mouth every other day. 3 more doses. Last dose 07/08/2013  Start taking on:  07/04/2013     levothyroxine 100 MCG tablet  Commonly known as:  SYNTHROID, LEVOTHROID  Take 50 mcg by mouth daily before breakfast.     lidocaine-prilocaine cream  Commonly known as:  EMLA  Apply topically as needed. Apply to port a cath site one hour before needle stick as needed.     magnesium oxide 400 (241.3 MG) MG tablet  Commonly known as:  MAG-OX  Take 0.5 tablets (200 mg total) by mouth daily.     ondansetron 8 MG tablet  Commonly known as:  ZOFRAN  Take 8 mg by mouth 2 (two) times daily as needed for nausea or vomiting.     polyethylene glycol packet  Commonly known as:  MIRALAX / GLYCOLAX  Take 17 g by mouth daily.     pravastatin 40 MG tablet  Commonly known as:  PRAVACHOL  Take 40 mg by mouth at bedtime.     senna 8.6 MG Tabs tablet  Commonly known as:  SENOKOT  Take 2 tablets (17.2 mg total) by mouth daily.     tiotropium 18 MCG inhalation capsule  Commonly known as:  SPIRIVA  Place 18 mcg into inhaler and inhale daily.     URELLE 81 MG Tabs tablet  Take 1 tablet (81 mg total) by mouth 3 (three) times daily as needed for bladder spasms.     Vitamin D3 2000 UNITS Tabs  Take 4,000 Units by mouth 2 (two) times daily.     warfarin 5 MG tablet  Commonly known as:  COUMADIN  Take 1 tablet (5 mg total) by mouth daily at 6 PM.       Follow-up Information   Schedule an appointment as soon as possible for a visit with [name], MD.   Specialty:  Internal Medicine   Contact information:   [address] [place] [address] [phone]       Follow up with MD at SNF. Schedule an appointment as soon as possible for a visit in 3  days. (Repeat labs (CBC, BMP, PT & INR) on 07/05/13 and will need Coumadin dose adjustment.)       Schedule an appointment as soon as possible for a visit with E[place]ridge, Marja Kays, MD.   Specialty:  Urology   Contact information:   [facility]  [place] [address] [phone]  The results of significant diagnostics from this hospitalization (including imaging, microbiology, ancillary and laboratory) are listed below for reference.    Significant Diagnostic Studies: Dg Chest 2 View  06/26/2013   CLINICAL DATA:  Cough, shortness of breath, weakness, history COPD, hypertension, asthma, diabetes, non-Hodgkin's lymphoma  EXAM: CHEST  2 VIEW  COMPARISON:  02/12/2013  FINDINGS: Right subclavian power port with tip projecting over SVC.  Left subclavian transvenous pacemaker leads project at right atrium and right ventricle.  Enlargement of cardiac silhouette with elongation of thoracic aorta.  Mediastinal contours and pulmonary vascularity otherwise normal.  Lungs clear.  No pleural effusion or pneumothorax.  Bones demineralized.  IMPRESSION: Enlargement of cardiac silhouette post pacemaker.  No acute abnormalities.   Electronically Signed   By: Lavonia Dana M.D.   On: 06/26/2013 17:29   Ct Chest W Contrast  06/05/2013   CLINICAL DATA:  Non-Hodgkin lymphoma.  EXAM: CT CHEST, ABDOMEN, AND PELVIS WITH CONTRAST  TECHNIQUE: Multidetector CT imaging of the chest, abdomen and pelvis was performed following the standard protocol during bolus administration of intravenous contrast.  CONTRAST:  51mL OMNIPAQUE IOHEXOL 300 MG/ML  SOLN  COMPARISON:  CT ABD/PELVIS W CM dated 03/27/2013  FINDINGS: CT CHEST FINDINGS  Right IJ Port-A-Cath terminates in the SVC. Mediastinal lymph nodes are not enlarged by CT size criteria. No hilar or axillary adenopathy. Heart is enlarged. No pericardial effusion. Small hiatal hernia.  Image quality is degraded by respiratory motion.  3 mm right upper lobe nodule is unchanged from 03/27/2013. No pleural fluid. Airway is unremarkable.  CT ABDOMEN AND PELVIS FINDINGS  Liver appears slightly decreased in attenuation diffusely. Cholecystectomy. Adrenal glands are unremarkable. There is slightly decreased attenuation of the right renal parenchyma, when compared with the left, with associated moderate to severe right hydronephrosis. Findings are new from 03/27/2013. Hydronephrosis is seen to the level of a retroperitoneal nodal mass anterior to the inferior vena cava, measuring 2.6 x 3.3 cm (previously 2.0 x 2.4 cm when remeasured). Additional retroperitoneal adenopathy with ill-defined soft tissue extending along the course of the abdominal aorta and inferior vena cava. This expands to a nodal mass at the aortic bifurcation, measuring 3.2 x 4.6 cm. Findings are progressive from 03/27/2013.  A 10 mm low-attenuation lesion in the upper pole right kidney unchanged. There is lack contrast excretion from the right kidney on delayed nephrographic phase imaging. Left kidney, spleen, pancreas, stomach and small bowel are unremarkable. A fair amount of stool is seen in the colon. Hysterectomy. No free fluid. No worrisome lytic or sclerotic lesions. Degenerative changes are seen in the spine.  IMPRESSION: 1. Enlarging retroperitoneal adenopathy results in moderate to severe right hydronephrosis, with evidence of decreased function in the right kidney. These results will be called to the ordering clinician or representative by the Radiologist Assistant, and communication documented in the PACS Dashboard. 2. Liver appears fatty. 3. Fair amount of stool in the colon is indicative of constipation.   Electronically Signed   By: Lorin Picket M.D.   On: 06/05/2013 16:37   Ct Abdomen Pelvis W Contrast  06/05/2013   CLINICAL DATA:  Non-Hodgkin lymphoma.  EXAM: CT CHEST, ABDOMEN, AND PELVIS WITH CONTRAST  TECHNIQUE: Multidetector CT imaging of the chest, abdomen  and pelvis was performed following the standard protocol during bolus administration of intravenous contrast.  CONTRAST:  79mL OMNIPAQUE IOHEXOL 300 MG/ML  SOLN  COMPARISON:  CT ABD/PELVIS W CM dated 03/27/2013  FINDINGS: CT CHEST FINDINGS  Right IJ Port-A-Cath terminates  in the SVC. Mediastinal lymph nodes are not enlarged by CT size criteria. No hilar or axillary adenopathy. Heart is enlarged. No pericardial effusion. Small hiatal hernia.  Image quality is degraded by respiratory motion. 3 mm right upper lobe nodule is unchanged from 03/27/2013. No pleural fluid. Airway is unremarkable.  CT ABDOMEN AND PELVIS FINDINGS  Liver appears slightly decreased in attenuation diffusely. Cholecystectomy. Adrenal glands are unremarkable. There is slightly decreased attenuation of the right renal parenchyma, when compared with the left, with associated moderate to severe right hydronephrosis. Findings are new from 03/27/2013. Hydronephrosis is seen to the level of a retroperitoneal nodal mass anterior to the inferior vena cava, measuring 2.6 x 3.3 cm (previously 2.0 x 2.4 cm when remeasured). Additional retroperitoneal adenopathy with ill-defined soft tissue extending along the course of the abdominal aorta and inferior vena cava. This expands to a nodal mass at the aortic bifurcation, measuring 3.2 x 4.6 cm. Findings are progressive from 03/27/2013.  A 10 mm low-attenuation lesion in the upper pole right kidney unchanged. There is lack contrast excretion from the right kidney on delayed nephrographic phase imaging. Left kidney, spleen, pancreas, stomach and small bowel are unremarkable. A fair amount of stool is seen in the colon. Hysterectomy. No free fluid. No worrisome lytic or sclerotic lesions. Degenerative changes are seen in the spine.  IMPRESSION: 1. Enlarging retroperitoneal adenopathy results in moderate to severe right hydronephrosis, with evidence of decreased function in the right kidney. These results will be  called to the ordering clinician or representative by the Radiologist Assistant, and communication documented in the PACS Dashboard. 2. Liver appears fatty. 3. Fair amount of stool in the colon is indicative of constipation.   Electronically Signed   By: Lorin Picket M.D.   On: 06/05/2013 16:37   Ir Perc Nephrostomy Right  06/27/2013   INDICATION: History of retroperitoneal lymphoma, now with right-sided urinary obstruction and development of urinary sepsis. Please perform percutaneous nephrostomy catheter placement  EXAM: 1. ULTRASOUND GUIDANCE FOR PUNCTURE OF THE RIGHT RENAL COLLECTING SYSTEM 2. RIGHT PERCUTANEOUS NEPHROSTOMY TUBE PLACEMENT.  COMPARISON:  CT ABD/PELVIS W CM dated 06/05/2013  MEDICATIONS: Rocephin 1 g IV; The antibiotic was administered in an appropriate time frame prior to skin puncture.  ANESTHESIA/SEDATION: Fentanyl 25 mcg IV; Versed 1 mg IV  Total Moderate Sedation Time  25 minutes.  CONTRAST:  20 mL Isovue 300 administered into the right collecting system  FLUOROSCOPY TIME:  3 minutes 42 seconds.  COMPLICATIONS: None immediate  PROCEDURE: The procedure, risks, benefits, and alternatives were explained to the patient. Questions regarding the procedure were encouraged and answered. The patient understands and consents to the procedure. A timeout was performed prior to the initiation of the procedure.  The right flank region was prepped with Betadine in a sterile fashion, and a sterile drape was applied covering the operative field. A sterile gown and sterile gloves were used for the procedure. Local anesthesia was provided with 1% Lidocaine with epinephrine. Ultrasound was used to localize the right kidney. Under direct ultrasound guidance, a 21 gauge needle was advanced into the renal collecting system. An ultrasound image documentation was performed. Access within the collecting system was confirmed with the efflux of urine followed by contrast injection.  Over a Nitrex wire, the inner  three [place] catheter of an Accustick set was advanced into the renal collecting system. Contrast injection was injected into the collecting system as several spot radiographs were obtained in various obliquities confirming puncture within a posterior inferior  calix. As such, the tract was dilated with an Accustick stent. Over a guide wire, a 10-French percutaneous nephrostomy catheter was advanced into the collecting system where the coil was formed and locked. Contrast was injected and several sport radiographs were obtained in various obliquities confirming access. The catheter was secured at the skin with a Prolene retention suture and a gravity bag was placed. A dressing was placed. The patient tolerated procedure well without immediate postprocedural complication.  FINDINGS: Ultrasound scanning demonstrates a moderate dilated right renal collecting system. Under direct ultrasound guidance, a posterior inferior calix was targeted allowing advancement of an 10-French percutaneous nephrostomy catheter under intermittent fluoroscopic guidance. Contrast injection confirmed appropriate positioning.  IMPRESSION: Successful ultrasound and fluoroscopic guided placement of a right sided 10 French PCN.   Electronically Signed   By: Sandi Mariscal M.D.   On: 06/27/2013 13:22   Ir US Guide Bx Asp/drain  06/27/2013   INDICATION: History of retroperitoneal lymphoma, now with right-sided urinary obstruction and development of urinary sepsis. Please perform percutaneous nephrostomy catheter placement  EXAM: 1. ULTRASOUND GUIDANCE FOR PUNCTURE OF THE RIGHT RENAL COLLECTING SYSTEM 2. RIGHT PERCUTANEOUS NEPHROSTOMY TUBE PLACEMENT.  COMPARISON:  CT ABD/PELVIS W CM dated 06/05/2013  MEDICATIONS: Rocephin 1 g IV; The antibiotic was administered in an appropriate time frame prior to skin puncture.  ANESTHESIA/SEDATION: Fentanyl 25 mcg IV; Versed 1 mg IV  Total Moderate Sedation Time  25 minutes.  CONTRAST:  20 mL Isovue 300  administered into the right collecting system  FLUOROSCOPY TIME:  3 minutes 42 seconds.  COMPLICATIONS: None immediate  PROCEDURE: The procedure, risks, benefits, and alternatives were explained to the patient. Questions regarding the procedure were encouraged and answered. The patient understands and consents to the procedure. A timeout was performed prior to the initiation of the procedure.  The right flank region was prepped with Betadine in a sterile fashion, and a sterile drape was applied covering the operative field. A sterile gown and sterile gloves were used for the procedure. Local anesthesia was provided with 1% Lidocaine with epinephrine. Ultrasound was used to localize the right kidney. Under direct ultrasound guidance, a 21 gauge needle was advanced into the renal collecting system. An ultrasound image documentation was performed. Access within the collecting system was confirmed with the efflux of urine followed by contrast injection.  Over a Nitrex wire, the inner three [place] catheter of an Accustick set was advanced into the renal collecting system. Contrast injection was injected into the collecting system as several spot radiographs were obtained in various obliquities confirming puncture within a posterior inferior calix. As such, the tract was dilated with an Accustick stent. Over a guide wire, a 10-French percutaneous nephrostomy catheter was advanced into the collecting system where the coil was formed and locked. Contrast was injected and several sport radiographs were obtained in various obliquities confirming access. The catheter was secured at the skin with a Prolene retention suture and a gravity bag was placed. A dressing was placed. The patient tolerated procedure well without immediate postprocedural complication.  FINDINGS: Ultrasound scanning demonstrates a moderate dilated right renal collecting system. Under direct ultrasound guidance, a posterior inferior calix was targeted  allowing advancement of an 10-French percutaneous nephrostomy catheter under intermittent fluoroscopic guidance. Contrast injection confirmed appropriate positioning.  IMPRESSION: Successful ultrasound and fluoroscopic guided placement of a right sided 10 French PCN.   Electronically Signed   By: Sandi Mariscal M.D.   On: 06/27/2013 13:22    Microbiology: Recent Results (from the  past 240 hour(s))  URINE CULTURE     Status: None   Collection Time    06/26/13  6:23 PM      Result Value Ref Range Status   Specimen Description URINE, CATHETERIZED   Final   Special Requests NONE   Final   Culture  Setup Time     Final   Value: 06/27/2013 00:13     Performed at [facility]     Final   Value: >=100,000 COLONIES/ML     Performed at [facility]   Culture     Final   Value: [place]     Performed at [facility]   Report Status 06/30/2013 FINAL   Final   Organism ID, Bacteria KLEBSIELLA PNEUMONIAE   Final   Organism ID, Bacteria CITROBACTER FREUNDII   Final  MRSA PCR SCREENING     Status: None   Collection Time    06/26/13 11:19 PM      Result Value Ref Range Status   MRSA by PCR NEGATIVE  NEGATIVE Final   Comment:            The GeneXpert MRSA Assay (FDA     approved for NASAL specimens     only), is one component of a     comprehensive MRSA colonization     surveillance program. It is not     intended to diagnose MRSA     infection nor to guide or     monitor treatment for     MRSA infections.     Labs: Basic Metabolic Panel:  Recent Labs Lab 06/29/13 0500 06/30/13 0353 07/01/13 0427 07/02/13 0438 07/03/13 0426  NA 136* 138 139 138 141  K 3.5* 3.3* 3.4* 3.6* 3.5*  CL 101 101 100 100 100  CO2 22 23 26 27 28   GLUCOSE 127* 121* 132* 115* 134*  BUN 25* 24* 22 19 18   CREATININE 1.92* 1.87* 1.71* 1.57* 1.64*  CALCIUM 8.6 8.8 9.3 9.5 9.9   Liver Function Tests:  Recent Labs Lab 06/26/13 1635   AST 17  ALT 11  ALKPHOS 54  BILITOT 0.4  PROT 6.7  ALBUMIN 3.5   No results found for this basename: LIPASE, AMYLASE,  in the last 168 hours No results found for this basename: AMMONIA,  in the last 168 hours CBC:  Recent Labs Lab 06/26/13 1635  06/29/13 0500 06/30/13 0353 06/30/13 1355 07/01/13 0427 07/02/13 0438  WBC 11.4*  < > 5.3 3.4* 4.6 3.8* 3.8*  NEUTROABS 9.3*  --   --   --   --   --   --   HGB 11.7*  < > 9.1* 10.0* 10.4* 9.1* 9.4*  HCT 36.9  < > 28.0* 30.1* 31.4* 28.1* 28.4*  MCV 95.6  < > 93.3 91.8 91.8 91.8 91.0  PLT 185  < > 126* 138* 148* 138* 161  < > = values in this interval not displayed. Cardiac Enzymes:  Recent Labs Lab 06/26/13 1635  TROPONINI <0.30   BNP: BNP (last 3 results) No results found for this basename: PROBNP,  in the last 8760 hours CBG:  Recent Labs Lab 07/02/13 1247 07/02/13 1648 07/02/13 2047 07/03/13 0726 07/03/13 1212  GLUCAP 152* 131* 158* 130* 131*      Signed:  Vernell Leep, MD, FACP, FHM. [facility] Pager [phone]  If 7PM-7AM, please contact night-coverage [email or web address] Password [facility] 07/03/2013, 2:18 PM

## 2013-07-03 NOTE — Progress Notes (Signed)
[name]   DOB:04-24-1940   YT#:[id number]   TFT#:[id number]  Subjective: Patient sitting up in chair eating her lunch.  She reports overall improvement and states she walked the hall today without difficulty. Warfarin re-started.   Objective:  Filed Vitals:   07/03/13 1413  BP: 106/50  Pulse: 69  Temp: 98.7 F (37.1 C)  Resp: 20    Body mass index is 52.52 kg/(m^2).  Intake/Output Summary (Last 24 hours) at 07/03/13 1705 Last data filed at 07/03/13 1509  Gross per 24 hour  Intake    360 ml  Output   3030 ml  Net  -2670 ml    Non-toxic appearing.   Sclerae unicteric  Oropharynx clear  Lungs clear -- no rales or rhonchi  Heart irregular irregular  Abdomen benign, obese  MSK no peripheral edema  Neuro nonfocal   CBG (last 3)   Recent Labs  07/02/13 2047 07/03/13 0726 07/03/13 1212  GLUCAP 158* 130* 131*     Labs:  Lab Results  Component Value Date   WBC 3.8* 07/02/2013   HGB 9.4* 07/02/2013   HCT 28.4* 07/02/2013   MCV 91.0 07/02/2013   PLT 161 07/02/2013   NEUTROABS 9.3* 10/17/2374   Basic Metabolic Panel:  Recent Labs Lab 06/29/13 0500 06/30/13 0353 07/01/13 0427 07/02/13 0438 07/03/13 0426  NA 136* 138 139 138 141  K 3.5* 3.3* 3.4* 3.6* 3.5*  CL 101 101 100 100 100  CO2 22 23 26 27 28   GLUCOSE 127* 121* 132* 115* 134*  BUN 25* 24* 22 19 18   CREATININE 1.92* 1.87* 1.71* 1.57* 1.64*  CALCIUM 8.6 8.8 9.3 9.5 9.9   GFR Estimated Creatinine Clearance: 31.5 ml/min (by C-G formula based on Cr of 1.64). Liver Function Tests: No results found for this basename: AST, ALT, ALKPHOS, BILITOT, PROT, ALBUMIN,  in the last 168 hours Coagulation profile  Recent Labs Lab 06/29/13 0500 06/30/13 0353 07/01/13 0427 07/02/13 0438 07/03/13 0426  INR 1.25 1.43 1.43 1.39 1.36    CBC:  Recent Labs Lab 06/29/13 0500 06/30/13 0353 06/30/13 1355 07/01/13 0427 07/02/13 0438  WBC 5.3 3.4* 4.6 3.8* 3.8*  HGB 9.1* 10.0* 10.4* 9.1* 9.4*  HCT 28.0* 30.1*  31.4* 28.1* 28.4*  MCV 93.3 91.8 91.8 91.8 91.0  PLT 126* 138* 148* 138* 161   Cardiac Enzymes: No results found for this basename: CKTOTAL, CKMB, CKMBINDEX, TROPONINI,  in the last 168 hours BNP: No components found with this basename: POCBNP,  CBG:  Recent Labs Lab 07/02/13 1247 07/02/13 1648 07/02/13 2047 07/03/13 0726 07/03/13 1212  GLUCAP 152* 131* 158* 130* 131*   Microbiology Recent Results (from the past 240 hour(s))  URINE CULTURE     Status: None   Collection Time    06/26/13  6:23 PM      Result Value Ref Range Status   Specimen Description URINE, CATHETERIZED   Final   Special Requests NONE   Final   Culture  Setup Time     Final   Value: 06/27/2013 00:13     Performed at [place]     Final   Value: >=100,000 COLONIES/ML     Performed at [facility]   Culture     Final   Value: [place]     Performed at [facility]   Report Status 06/30/2013 FINAL   Final   Organism ID, Bacteria KLEBSIELLA PNEUMONIAE  Final   Organism ID, Bacteria CITROBACTER FREUNDII   Final  MRSA PCR SCREENING     Status: None   Collection Time    06/26/13 11:19 PM      Result Value Ref Range Status   MRSA by PCR NEGATIVE  NEGATIVE Final   Comment:            The GeneXpert MRSA Assay (FDA     approved for NASAL specimens     only), is one component of a     comprehensive MRSA colonization     surveillance program. It is not     intended to diagnose MRSA     infection nor to guide or     monitor treatment for     MRSA infections.      Studies:  No results found.  Assessment/Plan: 73 y.o. with the following:  1. Gross Hematuria, resolved. -- Nephrostomy tube to drain. Urology follow-up in 2 weeks for internalization.    2. Urosepsis, resovlved --Plan per primary team.   Afebrile now. Urinary cultures consistent with K. Pneum, Citrobacter Freu. Discharging on levaquin.   3. Acute on  Chronic Kidney disease w right hydronephrosis secondary #3 plus/minus #2. --S/p R percutaneous nephrostomy by IR on yesterday (3/17).   Creatinine 1.64 today.  (Range 1.1 to 1.6 over the past one year).  4.  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  -We will likely pursue least aggressive therapy at this point (Rituxan plus revlimid). I also discussed with Dr. Cassell Clement of [facility], she is unlikely to be a candidate for transplant.  We will HOLD chemotherapy in the setting of active infection  Or #1.   --Fevers could also be due to lymphoma as well.   5. Anemia/ thrombocytopenia  -- Anemia secondary to #1.  Please follow CBC daily and transfuse for symptoms.    Please transfuse for symptoms or hbg less than 7.    6. Hypothyroidism: Levothyroxine per primary team  7. Hypertension: Diltiazem gtt per primary team..  8. Diabetes mellitus, type II: SSI per primary team.  9. Hyperlipidemia: Pravastatin per PCP.  10.History of atrial fibrillation: Diltiazem, pacemaker. Coumadin. Coumadin restarted.  11. COPD/ Chronic Bronchitis exacerbation.  --Stable.   We arrange for follow up appointment as an outpatient to discuss next treatment for her lymphoma.    Ellery Meroney, MD 07/03/2013  5:05 PM

## 2013-07-03 NOTE — Progress Notes (Signed)
Clinical Social Work Department CLINICAL SOCIAL WORK PLACEMENT NOTE 07/03/2013  Patient:  [name], [name]  Account Number:  [id number] Admit date:  06/26/2013  Clinical Social Worker:  Caren Hazy, LCSW  Date/time:  07/03/2013 12:00 M  Clinical Social Work is seeking post-discharge placement for this patient at the following level of care:   [place]   (*CSW will update this form in Epic as items are completed)   07/03/2013  Patient/family provided with [facility] Department of Clinical Social Work's list of facilities offering this level of care within the geographic area requested by the patient (or if unable, by the patient's family).  07/03/2013  Patient/family informed of their freedom to choose among providers that offer the needed level of care, that participate in Medicare, Medicaid or managed care program needed by the patient, have an available bed and are willing to accept the patient.  07/03/2013  Patient/family informed of [facility]' ownership interest in [facility], as well as of the fact that they are under no obligation to receive care at this facility.  PASARR submitted to EDS on 07/03/2013 PASARR number received from EDS on 07/03/2013  FL2 transmitted to all facilities in geographic area requested by pt/family on  07/03/2013 FL2 transmitted to all facilities within larger geographic area on 07/03/2013  Patient informed that his/her managed care company has contracts with or will negotiate with  certain facilities, including the following:     Patient/family informed of bed offers received:  07/03/2013 Patient chooses bed at [place] Physician recommends and patient chooses bed at  [facility]  Patient to be transferred to [facility] on  07/03/2013 Patient to be transferred to facility by [facility]  The following physician request were entered in Epic:   Additional Comments:

## 2013-07-03 NOTE — Progress Notes (Signed)
Physical Therapy Treatment Patient Details Name: [name] MRN: [id number] DOB: Dec 06, 1940 Today's Date: 07/03/2013 Time: 4627-0350 PT Time Calculation (min): 11 min  PT Assessment / Plan / Recommendation  History of Present Illness Pt is a 73 year old female with PMHx of sick sinus syndrome, s/p PPM, a fib on chronic Coumadin, hypertension, COPD, DM II, recurrent NHL, with obstructive right hydronephrosis secondary to lymphoma, was planned for elective cystoscopy and right ureteral stent on 3/17 but admitted on 3/16 with sepsis likely secondary to UTI/pyelonephritis, delirium and rapid a fib.    PT Comments   Progressing with mobility. Pt is still deconditioned. Continue to recommend SNF.   Follow Up Recommendations  SNF     Does the patient have the potential to tolerate intense rehabilitation     Barriers to Discharge        Equipment Recommendations  None recommended by PT    Recommendations for Other Services OT consult  Frequency Min 3X/week   Progress towards PT Goals Progress towards PT goals: Progressing toward goals  Plan Current plan remains appropriate    Precautions / Restrictions Precautions Precautions: Fall Precaution Comments: Rt PCN placed 3/17 Restrictions Weight Bearing Restrictions: No   Pertinent Vitals/Pain No c/o pain    Mobility  Bed Mobility General bed mobility comments: pt sitting in recliner Transfers Overall transfer level: Needs assistance Equipment used: Rolling walker (2 wheeled) Transfers: Sit to/from Stand Sit to Stand: Min guard General transfer comment: Assist to control descent. Pt attempts to sit before safely positioned in front of surface. VCs safety, technique, hand placement. Ambulation/Gait Ambulation/Gait assistance: Min assist;+2 physical assistance;+2 safety/equipment Ambulation Distance (Feet): 40 Feet (15'x1, 40'x1) Assistive device: Rolling walker (2 wheeled) Gait Pattern/deviations: Trunk flexed;Decreased step  length - left;Decreased step length - right;Decreased stride length General Gait Details: Moderate VCS for posture, distance from walker. Assist to stabilize/support and pt and to maneuver safely with pt. Followed closely with recliner. Pt fatigues easily. 1 seated rest break needed between walks.     Exercises     PT Diagnosis:    PT Problem List:   PT Treatment Interventions:     PT Goals (current goals can now be found in the care plan section)    Visit Information  Last PT Received On: 07/03/13 Assistance Needed: +2 History of Present Illness: Pt is a 73 year old female with PMHx of sick sinus syndrome, s/p PPM, a fib on chronic Coumadin, hypertension, COPD, DM II, recurrent NHL, with obstructive right hydronephrosis secondary to lymphoma, was planned for elective cystoscopy and right ureteral stent on 3/17 but admitted on 3/16 with sepsis likely secondary to UTI/pyelonephritis, delirium and rapid a fib.     Subjective Data      Cognition  Cognition Arousal/Alertness: Awake/alert Behavior During Therapy: WFL for tasks assessed/performed Overall Cognitive Status: Within Functional Limits for tasks assessed    Balance  Balance Overall balance assessment: History of Falls;Needs assistance Standing balance support: Bilateral upper extremity supported;During functional activity Standing balance-Leahy Scale: Poor  End of Session PT - End of Session Equipment Utilized During Treatment: Gait belt Activity Tolerance: Patient limited by fatigue Patient left: in chair;with call bell/phone within reach   GP     Weston Anna, MPT Pager: [phone]

## 2013-07-03 NOTE — Progress Notes (Signed)
CARE MANAGEMENT NOTE 07/03/2013  Patient:  [name], [name]   Account Number:  [id number]  Date Initiated:  06/27/2013  Documentation initiated by:  DAVIS,RHONDA  Subjective/Objective Assessment:   pt with schedule urinary surg felling ill cancelled symoptoms worsened and was brought to ed via ems, found to be sepstic with obstruction and severe urinary infection. hypotensive.     Action/Plan:   patient lives alone and independently, has family in area for support.   Anticipated DC Date:  07/03/2013   Anticipated DC Plan:  HOME/SELF CARE  In-house referral  NA      DC Planning Services  CM consult      PAC Choice  NA   Choice offered to / List presented to:  NA      DME agency  NA     [place] arranged  NA      [place] agency  NA   Status of service:  Completed, signed off Medicare Important Message given?  NA - LOS <3 / Initial given by admissions (If response is "NO", the following Medicare IM given date fields will be blank) Date Medicare IM given:   Date Additional Medicare IM given:    Discharge Disposition:  [place]  Per UR Regulation:  Reviewed for med. necessity/level of care/duration of stay  If discussed at [place] of Stay Meetings, dates discussed:    Comments:  07/03/13 [name] NR,BSN NCM [place].D/C SNF.  [id number] Rosana Hoes, RN, BSN, [place] [phone] Chart Reviewed for discharge and hospital needs. Discharge needs at time of review:  None present will follow for needs. Review of patient progress due on [id number].

## 2013-07-04 ENCOUNTER — Non-Acute Institutional Stay (SKILLED_NURSING_FACILITY): Payer: Medicare Other | Admitting: Adult Health

## 2013-07-04 ENCOUNTER — Encounter: Payer: Self-pay | Admitting: Adult Health

## 2013-07-04 DIAGNOSIS — Z7901 Long term (current) use of anticoagulants: Secondary | ICD-10-CM

## 2013-07-04 DIAGNOSIS — K5900 Constipation, unspecified: Secondary | ICD-10-CM

## 2013-07-04 DIAGNOSIS — A419 Sepsis, unspecified organism: Secondary | ICD-10-CM

## 2013-07-04 DIAGNOSIS — I4891 Unspecified atrial fibrillation: Secondary | ICD-10-CM

## 2013-07-04 DIAGNOSIS — E039 Hypothyroidism, unspecified: Secondary | ICD-10-CM

## 2013-07-04 DIAGNOSIS — N390 Urinary tract infection, site not specified: Secondary | ICD-10-CM

## 2013-07-04 DIAGNOSIS — Z5181 Encounter for therapeutic drug level monitoring: Secondary | ICD-10-CM

## 2013-07-04 DIAGNOSIS — J449 Chronic obstructive pulmonary disease, unspecified: Secondary | ICD-10-CM

## 2013-07-04 DIAGNOSIS — N1330 Unspecified hydronephrosis: Secondary | ICD-10-CM

## 2013-07-04 DIAGNOSIS — I10 Essential (primary) hypertension: Secondary | ICD-10-CM

## 2013-07-04 DIAGNOSIS — E785 Hyperlipidemia, unspecified: Secondary | ICD-10-CM

## 2013-07-04 DIAGNOSIS — M109 Gout, unspecified: Secondary | ICD-10-CM

## 2013-07-04 NOTE — Progress Notes (Signed)
Patient ID: [name], female   DOB: 1940/05/25, 73 y.o.   MRN: [id number]     [facility]  Allergies  Allergen Reactions  . Codeine Nausea Only  . Penicillins     Unknown     Chief Complaint  Patient presents with  . Hospitalization Follow-up    HPI:  She has been hospitalized for sepsis from uti; right hydronephrosis; and right nephrostomy tube placement. She has physical deconditioning weakness present. She has some pink tinged urine from her nephrostomy tube. I have contacted Rowe Robert PA to discuss flushing the tube. He stated it was ok to flush the tube with 5-10 cc sterile saline in order to keep blood clots out of the tubing. She will need to be seen by urology in order to have a stent placed. She is here for short term rehab with her goal to return back home.     Past Medical History  Diagnosis Date  . Sick sinus syndrome   . Hematoma     At the site of the pacemaker insertion.  . Hypotension   . Dyslipidemia   . History of atrial fibrillation   . Arthritis     Osteoarthritis  . Port-a-cath in place 12/08/10  . Status post chemotherapy completed 03/2011    R - CHOP q 3 weeks x 6  . S/P radiation therapy 05/28/2011 - 06/26/2011    Right Abdomen and Right Pelvis/3060 cGy in 17 Fractions  . Hypertension   . ASHD (arteriosclerotic heart disease)   . Gout   . COPD (chronic obstructive pulmonary disease)   . Asthma   . Hyperlipidemia   . Diabetes mellitus   . Hypothyroidism   . Diffuse large B cell lymphoma     Dr. Chisom,oncology  . Cancer 10/2010    large B cell lymphoma  . 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 Pacemaker     left chest  . Dysrhythmia     Hx. A. Fib, sick sinus syndrome- Pacemaker inserted.  . Cataracts, bilateral     Past Surgical History  Procedure Laterality Date  . Pacemaker insertion      Lead revision completed August 04, 2006- left chest G. Lovena Le  . Tubal ligation      Bilateral    . Insert / replace / remove pacemaker    . Abdominal hysterectomy    . Biopsy stomach  11/03/10    Soft Tissue Mass, Biopsy, Right Lower Quadrant Mesenteric Mass- High Grade Non-Hodgkins B Cell Lymphoma  with Flow Cytometry  . Bone biopsy  11/24/10    Bone Marrow, Aspirate Biospy, and clot, Left - No Involvement of Non-Hodgkin's Lymphoma Identified  . Portacath placement Right 06-23-13  . Cholecystectomy      laparoscopic  . Bladder suspension      VITAL SIGNS BP 123/69  Pulse 75  Ht 4\' 9"  (1.448 m)  Wt 222 lb 6.4 oz (100.88 kg)  BMI 48.11 kg/m2   Patient's Medications  New Prescriptions   No medications on file  Previous Medications   ALBUTEROL (PROVENTIL) (2.5 MG/3ML) 0.083% NEBULIZER SOLUTION    Take 3 mLs (2.5 mg total) by nebulization every 6 (six) hours as needed for shortness of breath.   ALLOPURINOL (ZYLOPRIM) 300 MG TABLET    Take 300 mg by mouth daily.   ATORVASTATIN (LIPITOR) 10 MG TABLET    Take 10 mg by mouth daily at 6 PM.   CHOLECALCIFEROL (VITAMIN D3) 2000  UNITS TABS    Take 4,000 Units by mouth 2 (two) times daily.    CITALOPRAM (CELEXA) 20 MG TABLET    Take 20 mg by mouth every morning.    DILTIAZEM (CARDIZEM CD) 180 MG 24 HR CAPSULE    Take 180 mg by mouth every morning.    DOCUSATE SODIUM (COLACE) 100 MG CAPSULE    Take 100 mg by mouth daily as needed for mild constipation.   GUAIFENESIN (MUCINEX) 600 MG 12 HR TABLET    Take 600 mg by mouth 2 (two) times daily.   LEVOFLOXACIN (LEVAQUIN) 750 MG TABLET    Take 1 tablet (750 mg total) by mouth every other day. 3 more doses. Last dose 07/08/2013   LEVOTHYROXINE (SYNTHROID, LEVOTHROID) 100 MCG TABLET    Take 50 mcg by mouth daily before breakfast.   LIDOCAINE-PRILOCAINE (EMLA) CREAM    Apply topically as needed. Apply to port a cath site one hour before needle stick as needed.   MAGNESIUM OXIDE (MAG-OX) 400 (241.3 MG) MG TABLET    Take 0.5 tablets (200 mg total) by mouth daily.   ONDANSETRON (ZOFRAN) 8 MG TABLET     Take 8 mg by mouth 2 (two) times daily as needed for nausea or vomiting.   POLYETHYLENE GLYCOL (MIRALAX / GLYCOLAX) PACKET    Take 17 g by mouth daily.   PRAVASTATIN (PRAVACHOL) 40 MG TABLET    Take 40 mg by mouth at bedtime.    SENNA (SENOKOT) 8.6 MG TABS TABLET    Take 2 tablets (17.2 mg total) by mouth daily.   TIOTROPIUM (SPIRIVA) 18 MCG INHALATION CAPSULE    Place 18 mcg into inhaler and inhale daily.   URELLE (URELLE/URISED) 81 MG TABS TABLET    Take 1 tablet (81 mg total) by mouth 3 (three) times daily as needed for bladder spasms.   WARFARIN (COUMADIN) 5 MG TABLET    Take 1 tablet (5 mg total) by mouth daily at 6 PM.  Modified Medications   No medications on file  Discontinued Medications   No medications on file    SIGNIFICANT DIAGNOSTIC EXAMS  06-26-13: chest x-ray: Enlargement of cardiac silhouette post pacemaker.No acute abnormalities.   LABS REVIEWED:   06-23-13: wbc 4.0; hgb 11.3; hct 36.1; mcv 96.5; plt 241; glucose 113; bun 26; creat 1.76; k+4.3; na++141; ca++10.3  06-26-13: wbc 11.4; hgb 11.7; hct 36.9; mcv 95.6 plt 185; glucose 184; bun 25; creat 1.95; k+4.4; na++137; liver normal albumin 3.5 06-30-13: wbc 3.4; hgb 10.0; hct 30.1; mcv 91.8 plt 138; glucose 121; bun 24; creat 1.87; k+3.3; na++138; ca++8.8 07-02-13: wbc 3.8; hgb 9.4; hct 28.4; mcv 91; plt 161; glucose 115; bun 19; creat 1.57; k+3.6; na++138; ca++9.5     Review of Systems  Constitutional: Negative for malaise/fatigue.  Eyes: Negative for blurred vision.  Respiratory: Negative for cough and shortness of breath.   Cardiovascular: Negative for chest pain, palpitations and leg swelling.  Gastrointestinal: Negative for heartburn, abdominal pain and constipation.  Genitourinary: Negative for dysuria.       Has right nephrostomy tube   Musculoskeletal: Negative for joint pain and myalgias.  Skin: Negative.   Neurological: Negative for dizziness and headaches.  Psychiatric/Behavioral: Negative for depression.  The patient is not nervous/anxious.       Physical Exam  Constitutional: She is oriented to person, place, and time. She appears well-developed and well-nourished. No distress.  obese  Neck: Normal range of motion. Neck supple. No JVD present. No thyromegaly present.  Cardiovascular: Normal rate, regular rhythm and intact distal pulses.   Pulmonary/Chest: Effort normal and breath sounds normal. No respiratory distress. She has no wheezes.  Abdominal: Soft. She exhibits no distension. There is no tenderness.  Musculoskeletal: Normal range of motion. She exhibits no edema.  Has mild generalized weakness present.   Neurological: She is alert and oriented to person, place, and time.  Skin: Skin is warm and dry. She is not diaphoretic.  Right nephrostomy tube with pink tinged urine present  Psychiatric: She has a normal mood and affect.      ASSESSMENT/ PLAN:  1. Sepsis due to uti: she is on levaquin 750 mg every other day through 07-08-13. Will continue urelle 81 mg daily for bladder spasms.  Will not make changes will monitor her status   2. Right hydronephrosis: is status post right nephrostomy tube nursing staff will empty bag every 4 hours in order to prevent dragging weight on tubing. I will irrigate tube with 5-10 cc sterile saline daily as needed and will educate the nursing staff as appropriate. Will setup a follow up appointment with Dr. Junious Silk and will continue to monitor her status   3. Afib/antigcoagulation management: her heart rate is stable: for her inr of 1.5 will continue her coumadin 5 mg daily and will check inr in the am and will continue to monitor her status.   4. Gout: no recent flares will continue allopurinol 300 mg daily and will mointor   5. Hypothyroidism: will continue synthroid 50 mcg dialy   6. Hypertension: she is stable will continue cardiazem cd 180 mg daily and will monitor  7. Depression: is emotionally stable; will continue celexa 20 mg daily    8. Dyslipidemia: will continue lipitor 10 mg daily  9. Copd: she is stable will continue spiriva 18 mcg inhaled daily; mucinex twice daily; and albuterol neb treatment every 6 hours as needed will monitor   10. Constipation: will continue miralax daily; senna 2 tabs daily; colace as needed daily   Will check cbc and bmp this week   Time spent with patient 50 minutes.     Ok Edwards NP [facility]  Contact [phone] Monday through Friday 8am- 5pm  After hours call [phone]

## 2013-07-05 ENCOUNTER — Other Ambulatory Visit (HOSPITAL_COMMUNITY): Payer: Self-pay | Admitting: Urology

## 2013-07-05 ENCOUNTER — Other Ambulatory Visit: Payer: Self-pay | Admitting: Internal Medicine

## 2013-07-05 DIAGNOSIS — N1330 Unspecified hydronephrosis: Secondary | ICD-10-CM

## 2013-07-05 DIAGNOSIS — C8330 Diffuse large B-cell lymphoma, unspecified site: Secondary | ICD-10-CM

## 2013-07-10 ENCOUNTER — Encounter: Payer: Self-pay | Admitting: Internal Medicine

## 2013-07-10 ENCOUNTER — Non-Acute Institutional Stay (SKILLED_NURSING_FACILITY): Payer: Medicare Other | Admitting: Internal Medicine

## 2013-07-10 DIAGNOSIS — R5383 Other fatigue: Secondary | ICD-10-CM

## 2013-07-10 DIAGNOSIS — J449 Chronic obstructive pulmonary disease, unspecified: Secondary | ICD-10-CM

## 2013-07-10 DIAGNOSIS — N390 Urinary tract infection, site not specified: Secondary | ICD-10-CM

## 2013-07-10 DIAGNOSIS — J4489 Other specified chronic obstructive pulmonary disease: Secondary | ICD-10-CM

## 2013-07-10 DIAGNOSIS — K5900 Constipation, unspecified: Secondary | ICD-10-CM

## 2013-07-10 DIAGNOSIS — I4891 Unspecified atrial fibrillation: Secondary | ICD-10-CM

## 2013-07-10 DIAGNOSIS — R531 Weakness: Secondary | ICD-10-CM

## 2013-07-10 DIAGNOSIS — R5381 Other malaise: Secondary | ICD-10-CM

## 2013-07-10 DIAGNOSIS — N1330 Unspecified hydronephrosis: Secondary | ICD-10-CM

## 2013-07-10 NOTE — Progress Notes (Signed)
Patient ID: [name], female   DOB: 05/06/1940, 73 y.o.   MRN: [id number]     [facility]   PCP: [name], MD  Code Status: full code  Allergies  Allergen Reactions  . Codeine Nausea Only  . Penicillins     Unknown    Chief Complaint: new admission  HPI:  73 y/o female patient is here for short term rehabilitation after hospital admission from 06/26/13- 07/03/13 with sepsis in setting of uti/ pyelonephritis. She also had afib with RVR. She has history of lymphoma and obstructing right hydronephrosis.She also has history of sick sinus syndrome s/p pacemaker, afib, HTN, DM , copd, NHL, among others. She underwent right percutaneous nephrostomy by IR and is here for rehabilitation. She was seen by IR, oncology and urology team in hospital.  She is seen in her room. She was seen by urology this morning. The plan is for her to have IR guided right stent placement. She mentions being constipated but denies any other complaints  Review of Systems:  Constitutional: Negative for fever, chills, diaphoresis.  HENT: Negative for congestion, hearing loss and sore throat.   Eyes: Negative for eye pain, blurred vision, double vision and discharge.  Respiratory: Negative for  shortness of breath and wheezing. Has cough and clear phlegm production  Cardiovascular: Negative for chest pain, palpitations, orthopnea and leg swelling.  Gastrointestinal: Negative for heartburn, nausea, vomiting, abdominal pain  Genitourinary: has right nephrostomy tube Musculoskeletal: Negative for back pain, falls Skin: Negative for itching and rash.  Neurological: Negative for dizziness, tingling, focal weakness and headaches. has weakness Psychiatric/Behavioral: Negative for depression and memory loss. The patient is not nervous/anxious.     Past Medical History  Diagnosis Date  . Sick sinus syndrome   . Hematoma     At the site of the pacemaker insertion.  . Hypotension   .  Dyslipidemia   . History of atrial fibrillation   . Arthritis     Osteoarthritis  . Port-a-cath in place 12/08/10  . Status post chemotherapy completed 03/2011    R - CHOP q 3 weeks x 6  . S/P radiation therapy 05/28/2011 - 06/26/2011    Right Abdomen and Right Pelvis/3060 cGy in 17 Fractions  . Hypertension   . ASHD (arteriosclerotic heart disease)   . Gout   . COPD (chronic obstructive pulmonary disease)   . Asthma   . Hyperlipidemia   . Diabetes mellitus   . Hypothyroidism   . Diffuse large B cell lymphoma     Dr. Chisom,oncology  . Cancer 10/2010    large B cell lymphoma  . 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 Pacemaker     left chest  . Dysrhythmia     Hx. A. Fib, sick sinus syndrome- Pacemaker inserted.  . Cataracts, bilateral    Past Surgical History  Procedure Laterality Date  . Pacemaker insertion      Lead revision completed August 04, 2006- left chest G. Lovena Le  . Tubal ligation      Bilateral  . Insert / replace / remove pacemaker    . Abdominal hysterectomy    . Biopsy stomach  11/03/10    Soft Tissue Mass, Biopsy, Right Lower Quadrant Mesenteric Mass- High Grade Non-Hodgkins B Cell Lymphoma  with Flow Cytometry  . Bone biopsy  11/24/10    Bone Marrow, Aspirate Biospy, and clot, Left - No Involvement of Non-Hodgkin's Lymphoma Identified  . Portacath placement  Right 06-23-13  . Cholecystectomy      laparoscopic  . Bladder suspension     Social History:   reports that she quit smoking about 2 years ago. Her smoking use included Cigarettes. She has a 45 pack-year smoking history. She has never used smokeless tobacco. She reports that she does not drink alcohol or use illicit drugs.  Family History  Problem Relation Age of Onset  . Stroke Mother   . Stroke Father   . Aneurysm Son     Brain  . Cancer Sister      1 sister had vaginal cancer  . Cancer Brother     Prostate    Medications: Patient's Medications  New  Prescriptions   No medications on file  Previous Medications   ALBUTEROL (PROVENTIL) (2.5 MG/3ML) 0.083% NEBULIZER SOLUTION    Take 3 mLs (2.5 mg total) by nebulization every 6 (six) hours as needed for shortness of breath.   ALLOPURINOL (ZYLOPRIM) 300 MG TABLET    Take 300 mg by mouth daily.   ATORVASTATIN (LIPITOR) 10 MG TABLET    Take 10 mg by mouth daily at 6 PM.   CHOLECALCIFEROL (VITAMIN D3) 2000 UNITS TABS    Take 4,000 Units by mouth 2 (two) times daily.    CITALOPRAM (CELEXA) 20 MG TABLET    Take 20 mg by mouth every morning.    DILTIAZEM (CARDIZEM CD) 180 MG 24 HR CAPSULE    Take 180 mg by mouth every morning.    DOCUSATE SODIUM (COLACE) 100 MG CAPSULE    Take 100 mg by mouth daily as needed for mild constipation.   GUAIFENESIN (MUCINEX) 600 MG 12 HR TABLET    Take 600 mg by mouth 2 (two) times daily.   LEVOFLOXACIN (LEVAQUIN) 750 MG TABLET    Take 1 tablet (750 mg total) by mouth every other day. 3 more doses. Last dose 07/08/2013   LEVOTHYROXINE (SYNTHROID, LEVOTHROID) 100 MCG TABLET    Take 50 mcg by mouth daily before breakfast.   LIDOCAINE-PRILOCAINE (EMLA) CREAM    Apply topically as needed. Apply to port a cath site one hour before needle stick as needed.   MAGNESIUM OXIDE (MAG-OX) 400 (241.3 MG) MG TABLET    Take 0.5 tablets (200 mg total) by mouth daily.   ONDANSETRON (ZOFRAN) 8 MG TABLET    Take 8 mg by mouth 2 (two) times daily as needed for nausea or vomiting.   POLYETHYLENE GLYCOL (MIRALAX / GLYCOLAX) PACKET    Take 17 g by mouth daily.   PRAVASTATIN (PRAVACHOL) 40 MG TABLET    Take 40 mg by mouth at bedtime.    SENNA (SENOKOT) 8.6 MG TABS TABLET    Take 2 tablets (17.2 mg total) by mouth daily.   TIOTROPIUM (SPIRIVA) 18 MCG INHALATION CAPSULE    Place 18 mcg into inhaler and inhale daily.   URELLE (URELLE/URISED) 81 MG TABS TABLET    Take 1 tablet (81 mg total) by mouth 3 (three) times daily as needed for bladder spasms.   WARFARIN (COUMADIN) 5 MG TABLET    Take 1 tablet  (5 mg total) by mouth daily at 6 PM.  Modified Medications   No medications on file  Discontinued Medications   No medications on file     Physical Exam: BP 108/66  Pulse 75  Temp(Src) 99.3 F (37.4 C)  Resp 16  Ht 4\' 9"  (1.448 m)  Wt 222 lb (100.699 kg)  BMI 48.03 kg/m2  General- elderly  female in no acute distress Head- atraumatic, normocephalic Eyes- PERRLA, EOMI, no pallor, no icterus, no discharge Cardiovascular- irregular heart rate, no murmurs/ rubs/ gallops Respiratory- bilateral clear to auscultation, no wheeze, no rhonchi, no crackles, no use of accessory muscles Abdomen- bowel sounds present, soft, non tender,right percutaneous nephrostomy and bag has clear urine Musculoskeletal- able to move all 4 extremities, no spinal and paraspinal tenderness, steady gait, no use of assistive device, normal range of motion, no leg edema Neurological- no focal deficit, normal reflexes, normal muscle strength, normal sensation to fine touch and vibration Skin- warm and dry Psychiatry- alert and oriented to person, place and time, normal mood and affect    Labs reviewed: Basic Metabolic Panel:  Recent Labs  12/23/12 1008 12/27/12 1029  12/30/12 1227  07/01/13 0427 07/02/13 0438 07/03/13 0426  NA  --  140  < >  --   < > 139 138 141  K  --  4.3  < >  --   < > 3.4* 3.6* 3.5*  CL  --   --   --   --   < > 100 100 100  CO2  --  29  < >  --   < > 26 27 28   GLUCOSE  --  106  < >  --   < > 132* 115* 134*  BUN  --  10.5  < >  --   < > 22 19 18   CREATININE  --  1.4*  < >  --   < > 1.71* 1.57* 1.64*  CALCIUM  --  9.4  < >  --   < > 9.3 9.5 9.9  MG 1.3* 1.7  --  1.6  --   --   --   --   < > = values in this interval not displayed. Liver Function Tests:  Recent Labs  05/11/13 0932 06/12/13 1425 06/26/13 1635  AST 16 14 17   ALT 15 12 11   ALKPHOS 67 70 54  BILITOT 0.24 0.24 0.4  PROT 7.1 6.5 6.7  ALBUMIN 4.2 3.6 3.5    Recent Labs  02/11/13 2333  LIPASE 38   No  results found for this basename: AMMONIA,  in the last 8760 hours CBC:  Recent Labs  05/11/13 0932 06/12/13 1425  06/26/13 1635  06/30/13 1355 07/01/13 0427 07/02/13 0438  WBC 9.5 3.1*  < > 11.4*  < > 4.6 3.8* 3.8*  NEUTROABS 7.1* 1.3*  --  9.3*  --   --   --   --   HGB 12.0 10.6*  < > 11.7*  < > 10.4* 9.1* 9.4*  HCT 36.3 32.9*  < > 36.9  < > 31.4* 28.1* 28.4*  MCV 97.7 95.6  < > 95.6  < > 91.8 91.8 91.0  PLT 292 281  < > 185  < > 148* 138* 161  < > = values in this interval not displayed. Cardiac Enzymes:  Recent Labs  06/26/13 1635  TROPONINI <0.30   BNP: No components found with this basename: POCBNP,  CBG:  Recent Labs  07/02/13 2047 07/03/13 0726 07/03/13 1212  GLUCAP 158* 130* 131*    Radiological Exams: Dg Chest 2 View  06/26/2013   CLINICAL DATA:  Cough, shortness of breath, weakness, history COPD, hypertension, asthma, diabetes, non-Hodgkin's lymphoma  EXAM: CHEST  2 VIEW  COMPARISON:  02/12/2013  FINDINGS: Right subclavian power port with tip projecting over SVC.  Left subclavian transvenous pacemaker leads project at  right atrium and right ventricle.  Enlargement of cardiac silhouette with elongation of thoracic aorta.  Mediastinal contours and pulmonary vascularity otherwise normal.  Lungs clear.  No pleural effusion or pneumothorax.  Bones demineralized.  IMPRESSION: Enlargement of cardiac silhouette post pacemaker.  No acute abnormalities.   Electronically Signed   By: Lavonia Dana M.D.   On: 06/26/2013 17:29   Ct Chest W Contrast  06/05/2013   CLINICAL DATA:  Non-Hodgkin lymphoma.  EXAM: CT CHEST, ABDOMEN, AND PELVIS WITH CONTRAST  TECHNIQUE: Multidetector CT imaging of the chest, abdomen and pelvis was performed following the standard protocol during bolus administration of intravenous contrast.  CONTRAST:  41mL OMNIPAQUE IOHEXOL 300 MG/ML  SOLN  COMPARISON:  CT ABD/PELVIS W CM dated 03/27/2013  FINDINGS: CT CHEST FINDINGS  Right IJ Port-A-Cath terminates in  the SVC. Mediastinal lymph nodes are not enlarged by CT size criteria. No hilar or axillary adenopathy. Heart is enlarged. No pericardial effusion. Small hiatal hernia.  Image quality is degraded by respiratory motion. 3 mm right upper lobe nodule is unchanged from 03/27/2013. No pleural fluid. Airway is unremarkable.  CT ABDOMEN AND PELVIS FINDINGS  Liver appears slightly decreased in attenuation diffusely. Cholecystectomy. Adrenal glands are unremarkable. There is slightly decreased attenuation of the right renal parenchyma, when compared with the left, with associated moderate to severe right hydronephrosis. Findings are new from 03/27/2013. Hydronephrosis is seen to the level of a retroperitoneal nodal mass anterior to the inferior vena cava, measuring 2.6 x 3.3 cm (previously 2.0 x 2.4 cm when remeasured). Additional retroperitoneal adenopathy with ill-defined soft tissue extending along the course of the abdominal aorta and inferior vena cava. This expands to a nodal mass at the aortic bifurcation, measuring 3.2 x 4.6 cm. Findings are progressive from 03/27/2013.  A 10 mm low-attenuation lesion in the upper pole right kidney unchanged. There is lack contrast excretion from the right kidney on delayed nephrographic phase imaging. Left kidney, spleen, pancreas, stomach and small bowel are unremarkable. A fair amount of stool is seen in the colon. Hysterectomy. No free fluid. No worrisome lytic or sclerotic lesions. Degenerative changes are seen in the spine.  IMPRESSION: 1. Enlarging retroperitoneal adenopathy results in moderate to severe right hydronephrosis, with evidence of decreased function in the right kidney. These results will be called to the ordering clinician or representative by the Radiologist Assistant, and communication documented in the PACS Dashboard. 2. Liver appears fatty. 3. Fair amount of stool in the colon is indicative of constipation.   Electronically Signed   By: Lorin Picket M.D.    On: 06/05/2013 16:37   Ct Abdomen Pelvis W Contrast  06/05/2013   CLINICAL DATA:  Non-Hodgkin lymphoma.  EXAM: CT CHEST, ABDOMEN, AND PELVIS WITH CONTRAST  TECHNIQUE: Multidetector CT imaging of the chest, abdomen and pelvis was performed following the standard protocol during bolus administration of intravenous contrast.  CONTRAST:  82mL OMNIPAQUE IOHEXOL 300 MG/ML  SOLN  COMPARISON:  CT ABD/PELVIS W CM dated 03/27/2013  FINDINGS: CT CHEST FINDINGS  Right IJ Port-A-Cath terminates in the SVC. Mediastinal lymph nodes are not enlarged by CT size criteria. No hilar or axillary adenopathy. Heart is enlarged. No pericardial effusion. Small hiatal hernia.  Image quality is degraded by respiratory motion. 3 mm right upper lobe nodule is unchanged from 03/27/2013. No pleural fluid. Airway is unremarkable.  CT ABDOMEN AND PELVIS FINDINGS  Liver appears slightly decreased in attenuation diffusely. Cholecystectomy. Adrenal glands are unremarkable. There is slightly decreased attenuation of the  right renal parenchyma, when compared with the left, with associated moderate to severe right hydronephrosis. Findings are new from 03/27/2013. Hydronephrosis is seen to the level of a retroperitoneal nodal mass anterior to the inferior vena cava, measuring 2.6 x 3.3 cm (previously 2.0 x 2.4 cm when remeasured). Additional retroperitoneal adenopathy with ill-defined soft tissue extending along the course of the abdominal aorta and inferior vena cava. This expands to a nodal mass at the aortic bifurcation, measuring 3.2 x 4.6 cm. Findings are progressive from 03/27/2013.  A 10 mm low-attenuation lesion in the upper pole right kidney unchanged. There is lack contrast excretion from the right kidney on delayed nephrographic phase imaging. Left kidney, spleen, pancreas, stomach and small bowel are unremarkable. A fair amount of stool is seen in the colon. Hysterectomy. No free fluid. No worrisome lytic or sclerotic lesions. Degenerative  changes are seen in the spine.  IMPRESSION: 1. Enlarging retroperitoneal adenopathy results in moderate to severe right hydronephrosis, with evidence of decreased function in the right kidney. These results will be called to the ordering clinician or representative by the Radiologist Assistant, and communication documented in the PACS Dashboard. 2. Liver appears fatty. 3. Fair amount of stool in the colon is indicative of constipation.   Electronically Signed   By: Lorin Picket M.D.   On: 06/05/2013 16:37   Ir Perc Nephrostomy Right  06/27/2013   INDICATION: History of retroperitoneal lymphoma, now with right-sided urinary obstruction and development of urinary sepsis. Please perform percutaneous nephrostomy catheter placement  EXAM: 1. ULTRASOUND GUIDANCE FOR PUNCTURE OF THE RIGHT RENAL COLLECTING SYSTEM 2. RIGHT PERCUTANEOUS NEPHROSTOMY TUBE PLACEMENT.  COMPARISON:  CT ABD/PELVIS W CM dated 06/05/2013  MEDICATIONS: Rocephin 1 g IV; The antibiotic was administered in an appropriate time frame prior to skin puncture.  ANESTHESIA/SEDATION: Fentanyl 25 mcg IV; Versed 1 mg IV  Total Moderate Sedation Time  25 minutes.  CONTRAST:  20 mL Isovue 300 administered into the right collecting system  FLUOROSCOPY TIME:  3 minutes 42 seconds.  COMPLICATIONS: None immediate  PROCEDURE: The procedure, risks, benefits, and alternatives were explained to the patient. Questions regarding the procedure were encouraged and answered. The patient understands and consents to the procedure. A timeout was performed prior to the initiation of the procedure.  The right flank region was prepped with Betadine in a sterile fashion, and a sterile drape was applied covering the operative field. A sterile gown and sterile gloves were used for the procedure. Local anesthesia was provided with 1% Lidocaine with epinephrine. Ultrasound was used to localize the right kidney. Under direct ultrasound guidance, a 21 gauge needle was advanced into the  renal collecting system. An ultrasound image documentation was performed. Access within the collecting system was confirmed with the efflux of urine followed by contrast injection.  Over a Nitrex wire, the inner three [place] catheter of an Accustick set was advanced into the renal collecting system. Contrast injection was injected into the collecting system as several spot radiographs were obtained in various obliquities confirming puncture within a posterior inferior calix. As such, the tract was dilated with an Accustick stent. Over a guide wire, a 10-French percutaneous nephrostomy catheter was advanced into the collecting system where the coil was formed and locked. Contrast was injected and several sport radiographs were obtained in various obliquities confirming access. The catheter was secured at the skin with a Prolene retention suture and a gravity bag was placed. A dressing was placed. The patient tolerated procedure well without immediate postprocedural  complication.  FINDINGS: Ultrasound scanning demonstrates a moderate dilated right renal collecting system. Under direct ultrasound guidance, a posterior inferior calix was targeted allowing advancement of an 10-French percutaneous nephrostomy catheter under intermittent fluoroscopic guidance. Contrast injection confirmed appropriate positioning.  IMPRESSION: Successful ultrasound and fluoroscopic guided placement of a right sided 10 French PCN.   Electronically Signed   By: Sandi Mariscal M.D.   On: 06/27/2013 13:22   Ir US Guide Bx Asp/drain  06/27/2013   INDICATION: History of retroperitoneal lymphoma, now with right-sided urinary obstruction and development of urinary sepsis. Please perform percutaneous nephrostomy catheter placement  EXAM: 1. ULTRASOUND GUIDANCE FOR PUNCTURE OF THE RIGHT RENAL COLLECTING SYSTEM 2. RIGHT PERCUTANEOUS NEPHROSTOMY TUBE PLACEMENT.  COMPARISON:  CT ABD/PELVIS W CM dated 06/05/2013  MEDICATIONS: Rocephin 1 g IV; The  antibiotic was administered in an appropriate time frame prior to skin puncture.  ANESTHESIA/SEDATION: Fentanyl 25 mcg IV; Versed 1 mg IV  Total Moderate Sedation Time  25 minutes.  CONTRAST:  20 mL Isovue 300 administered into the right collecting system  FLUOROSCOPY TIME:  3 minutes 42 seconds.  COMPLICATIONS: None immediate  PROCEDURE: The procedure, risks, benefits, and alternatives were explained to the patient. Questions regarding the procedure were encouraged and answered. The patient understands and consents to the procedure. A timeout was performed prior to the initiation of the procedure.  The right flank region was prepped with Betadine in a sterile fashion, and a sterile drape was applied covering the operative field. A sterile gown and sterile gloves were used for the procedure. Local anesthesia was provided with 1% Lidocaine with epinephrine. Ultrasound was used to localize the right kidney. Under direct ultrasound guidance, a 21 gauge needle was advanced into the renal collecting system. An ultrasound image documentation was performed. Access within the collecting system was confirmed with the efflux of urine followed by contrast injection.  Over a Nitrex wire, the inner three [place] catheter of an Accustick set was advanced into the renal collecting system. Contrast injection was injected into the collecting system as several spot radiographs were obtained in various obliquities confirming puncture within a posterior inferior calix. As such, the tract was dilated with an Accustick stent. Over a guide wire, a 10-French percutaneous nephrostomy catheter was advanced into the collecting system where the coil was formed and locked. Contrast was injected and several sport radiographs were obtained in various obliquities confirming access. The catheter was secured at the skin with a Prolene retention suture and a gravity bag was placed. A dressing was placed. The patient tolerated procedure well without  immediate postprocedural complication.  FINDINGS: Ultrasound scanning demonstrates a moderate dilated right renal collecting system. Under direct ultrasound guidance, a posterior inferior calix was targeted allowing advancement of an 10-French percutaneous nephrostomy catheter under intermittent fluoroscopic guidance. Contrast injection confirmed appropriate positioning.  IMPRESSION: Successful ultrasound and fluoroscopic guided placement of a right sided 10 French PCN.   Electronically Signed   By: Sandi Mariscal M.D.   On: 06/27/2013 13:22    Assessment/Plan  Generalized weakness- in setting of her recent urosepsis and lymphoma. Will have her work with PT and OT for strengthening exercises. Fall precautions  Right hydronephrosis- s/p nephrostomy tube, draining out clear urine. Reviewed urology note. Plan is for stent placement. Continue iriigation of the tube with saline as needed and skin care  UTI- completed course of levaquin. Will continue her urelle for now  Constipation- will have her senna and colace stopped and have her po senna s  1 tab daily. Continue miralax   Afib- rate controlled. Continue cardizem cd 180mg  daily for rate control and coumadin for anticoagulation with goal inr 2-3. Last inr 2.4 and is on coumadin 5 mg daily. Monitor inr  Hypothyroidism- continue synthroid 50 mcg dialy   Depression- continue celexa 20 mg daily   Copd- continue spiriva 18 mcg inhaled daily with prn albuterol neb treatment and prn mucinex for now  Anemia- monitor hemoglobin/ hematocrit   Family/ staff Communication: reviewed care plan with patient and nursing supervisor   Goals of care: STR   Labs/tests ordered- cbc, bmp    Blanchie Serve, MD  [facility] [phone] (Monday-Friday 8 am - 5 pm) [phone] (afterhours)

## 2013-07-12 ENCOUNTER — Other Ambulatory Visit: Payer: Self-pay | Admitting: Radiology

## 2013-07-13 ENCOUNTER — Other Ambulatory Visit (HOSPITAL_COMMUNITY): Payer: Medicare Other

## 2013-07-14 ENCOUNTER — Encounter (HOSPITAL_COMMUNITY): Payer: Self-pay

## 2013-07-14 ENCOUNTER — Ambulatory Visit (HOSPITAL_COMMUNITY)
Admission: RE | Admit: 2013-07-14 | Discharge: 2013-07-14 | Disposition: A | Discharge status: Home / Self Care | Payer: Medicare Other | Source: Ambulatory Visit | Attending: Urology | Admitting: Urology

## 2013-07-14 ENCOUNTER — Other Ambulatory Visit (HOSPITAL_COMMUNITY): Payer: Self-pay | Admitting: Urology

## 2013-07-14 DIAGNOSIS — J4489 Other specified chronic obstructive pulmonary disease: Secondary | ICD-10-CM | POA: Insufficient documentation

## 2013-07-14 DIAGNOSIS — E119 Type 2 diabetes mellitus without complications: Secondary | ICD-10-CM | POA: Insufficient documentation

## 2013-07-14 DIAGNOSIS — Z79899 Other long term (current) drug therapy: Secondary | ICD-10-CM | POA: Insufficient documentation

## 2013-07-14 DIAGNOSIS — E039 Hypothyroidism, unspecified: Secondary | ICD-10-CM | POA: Insufficient documentation

## 2013-07-14 DIAGNOSIS — Z7901 Long term (current) use of anticoagulants: Secondary | ICD-10-CM | POA: Insufficient documentation

## 2013-07-14 DIAGNOSIS — Z436 Encounter for attention to other artificial openings of urinary tract: Secondary | ICD-10-CM | POA: Insufficient documentation

## 2013-07-14 DIAGNOSIS — Z87898 Personal history of other specified conditions: Secondary | ICD-10-CM | POA: Insufficient documentation

## 2013-07-14 DIAGNOSIS — Z9089 Acquired absence of other organs: Secondary | ICD-10-CM | POA: Insufficient documentation

## 2013-07-14 DIAGNOSIS — N1330 Unspecified hydronephrosis: Secondary | ICD-10-CM

## 2013-07-14 DIAGNOSIS — J449 Chronic obstructive pulmonary disease, unspecified: Secondary | ICD-10-CM | POA: Insufficient documentation

## 2013-07-14 DIAGNOSIS — Z90710 Acquired absence of both cervix and uterus: Secondary | ICD-10-CM | POA: Insufficient documentation

## 2013-07-14 DIAGNOSIS — M109 Gout, unspecified: Secondary | ICD-10-CM | POA: Insufficient documentation

## 2013-07-14 DIAGNOSIS — Z950 Presence of cardiac pacemaker: Secondary | ICD-10-CM | POA: Insufficient documentation

## 2013-07-14 DIAGNOSIS — N135 Crossing vessel and stricture of ureter without hydronephrosis: Secondary | ICD-10-CM | POA: Insufficient documentation

## 2013-07-14 DIAGNOSIS — Z87891 Personal history of nicotine dependence: Secondary | ICD-10-CM | POA: Insufficient documentation

## 2013-07-14 DIAGNOSIS — I10 Essential (primary) hypertension: Secondary | ICD-10-CM | POA: Insufficient documentation

## 2013-07-14 DIAGNOSIS — Z9221 Personal history of antineoplastic chemotherapy: Secondary | ICD-10-CM | POA: Insufficient documentation

## 2013-07-14 DIAGNOSIS — E785 Hyperlipidemia, unspecified: Secondary | ICD-10-CM | POA: Insufficient documentation

## 2013-07-14 DIAGNOSIS — I4891 Unspecified atrial fibrillation: Secondary | ICD-10-CM | POA: Insufficient documentation

## 2013-07-14 DIAGNOSIS — Z923 Personal history of irradiation: Secondary | ICD-10-CM | POA: Insufficient documentation

## 2013-07-14 HISTORY — PX: URETERAL STENT PLACEMENT: SHX822

## 2013-07-14 LAB — BASIC METABOLIC PANEL
BUN: 22 mg/dL (ref 6–23)
CHLORIDE: 96 meq/L (ref 96–112)
CO2: 31 meq/L (ref 19–32)
Calcium: 11.7 mg/dL — ABNORMAL HIGH (ref 8.4–10.5)
Creatinine, Ser: 1.76 mg/dL — ABNORMAL HIGH (ref 0.50–1.10)
GFR calc Af Amer: 32 mL/min — ABNORMAL LOW (ref >90)
GFR calc non Af Amer: 28 mL/min — ABNORMAL LOW (ref >90)
Glucose, Bld: 133 mg/dL — ABNORMAL HIGH (ref 70–99)
Potassium: 4.3 mEq/L (ref 3.7–5.3)
SODIUM: 139 meq/L (ref 137–147)

## 2013-07-14 LAB — CBC
HEMATOCRIT: 32.2 % — AB (ref 36.0–46.0)
Hemoglobin: 10.2 g/dL — ABNORMAL LOW (ref 12.0–15.0)
MCH: 28.9 pg (ref 26.0–34.0)
MCHC: 31.7 g/dL (ref 30.0–36.0)
MCV: 91.2 fL (ref 78.0–100.0)
Platelets: 249 10*3/uL (ref 150–400)
RBC: 3.53 MIL/uL — AB (ref 3.87–5.11)
RDW: 15.4 % (ref 11.5–15.5)
WBC: 7.7 10*3/uL (ref 4.0–10.5)

## 2013-07-14 LAB — GLUCOSE, CAPILLARY
GLUCOSE-CAPILLARY: 119 mg/dL — AB (ref 70–99)
GLUCOSE-CAPILLARY: 124 mg/dL — AB (ref 70–99)

## 2013-07-14 LAB — PROTIME-INR
INR: 1.34 (ref 0.00–1.49)
Prothrombin Time: 16.3 seconds — ABNORMAL HIGH (ref 11.6–15.2)

## 2013-07-14 LAB — APTT: aPTT: 34 seconds (ref 24–37)

## 2013-07-14 MED ORDER — FENTANYL CITRATE 0.05 MG/ML IJ SOLN
INTRAMUSCULAR | Status: AC
  Filled 2013-07-14: qty 6

## 2013-07-14 MED ORDER — MIDAZOLAM HCL 2 MG/2ML IJ SOLN
INTRAMUSCULAR | Status: AC
  Filled 2013-07-14: qty 6

## 2013-07-14 MED ORDER — SODIUM CHLORIDE 0.9 % IV SOLN
Freq: Once | INTRAVENOUS | Status: AC
  Administered 2013-07-14: 10:00:00 via INTRAVENOUS

## 2013-07-14 MED ORDER — MIDAZOLAM HCL 2 MG/2ML IJ SOLN
INTRAMUSCULAR | Status: AC | PRN
  Administered 2013-07-14 (×2): 1 mg via INTRAVENOUS

## 2013-07-14 MED ORDER — IOHEXOL 300 MG/ML  SOLN
25.0000 mL | Freq: Once | INTRAMUSCULAR | Status: AC | PRN
Start: 2013-07-14 — End: 2013-07-14
  Administered 2013-07-14: 40 mL

## 2013-07-14 MED ORDER — DEXTROSE 5 % IV SOLN
1.0000 g | Freq: Once | INTRAVENOUS | Status: AC
  Administered 2013-07-14: 1 g via INTRAVENOUS
  Filled 2013-07-14: qty 10

## 2013-07-14 MED ORDER — FENTANYL CITRATE 0.05 MG/ML IJ SOLN
INTRAMUSCULAR | Status: AC | PRN
  Administered 2013-07-14: 100 ug via INTRAVENOUS

## 2013-07-14 MED ORDER — LIDOCAINE HCL 1 % IJ SOLN
INTRAMUSCULAR | Status: AC
  Filled 2013-07-14: qty 20

## 2013-07-14 NOTE — Discharge Instructions (Signed)
4x4 gauze dressing  to right lower back.  This gauze may have some urine leakage until area completely closed.  If gauze becomes wet/damp with urine remove and place dry gauze over area.  Call MD if area is red or swollen. Call for fever or increased pain.

## 2013-07-14 NOTE — Progress Notes (Signed)
Pt here for prep for procedure . Pt states she"has a PAC but has not been accessed since 12/14". Peripheral IV was started in left lateral forearm and lab work drawn via IV site and tubed to lab.

## 2013-07-14 NOTE — Procedures (Signed)
R ureteral stent No comp

## 2013-07-14 NOTE — H&P (Signed)
[name] is an 73 y.o. female.   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  Past Medical History  Diagnosis Date  . Sick sinus syndrome   . Hematoma     At the site of the pacemaker insertion.  . Hypotension   . Dyslipidemia   . History of atrial fibrillation   . Arthritis     Osteoarthritis  . Port-a-cath in place 12/08/10  . Status post chemotherapy completed 03/2011    R - CHOP q 3 weeks x 6  . S/P radiation therapy 05/28/2011 - 06/26/2011    Right Abdomen and Right Pelvis/3060 cGy in 17 Fractions  . Hypertension   . ASHD (arteriosclerotic heart disease)   . Gout   . COPD (chronic obstructive pulmonary disease)   . Asthma   . Hyperlipidemia   . Diabetes mellitus   . Hypothyroidism   . Diffuse large B cell lymphoma     Dr. Chisom,oncology  . Cancer 10/2010    large B cell lymphoma  . 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 Pacemaker     left chest  . Dysrhythmia     Hx. A. Fib, sick sinus syndrome- Pacemaker inserted.  . Cataracts, bilateral     Past Surgical History  Procedure Laterality Date  . Pacemaker insertion      Lead revision completed August 04, 2006- left chest G. Lovena Le  . Tubal ligation      Bilateral  . Insert / replace / remove pacemaker    . Abdominal hysterectomy    . Biopsy stomach  11/03/10    Soft Tissue Mass, Biopsy, Right Lower Quadrant Mesenteric Mass- High Grade Non-Hodgkins B Cell Lymphoma  with Flow Cytometry  . Bone biopsy  11/24/10    Bone Marrow, Aspirate Biospy, and clot, Left - No Involvement of Non-Hodgkin's Lymphoma Identified  . Portacath placement Right 06-23-13  . Cholecystectomy      laparoscopic  . Bladder suspension      Family  History  Problem Relation Age of Onset  . Stroke Mother   . Stroke Father   . Aneurysm Son     Brain  . Cancer Sister      1 sister had vaginal cancer  . Cancer Brother     Prostate   Social History:  reports that she quit smoking about 2 years ago. Her smoking use included Cigarettes. She has a 45 pack-year smoking history. She has never used smokeless tobacco. She reports that she does not drink alcohol or use illicit drugs.  Allergies:  Allergies  Allergen Reactions  . Codeine Nausea Only  . Penicillins     Unknown    Current outpatient prescriptions:albuterol (PROVENTIL) (2.5 MG/3ML) 0.083% nebulizer solution, Take 3 mLs (2.5 mg total) by nebulization every 6 (six) hours as needed for shortness of breath., Disp: 75 mL, Rfl: 6;  allopurinol (ZYLOPRIM) 300 MG tablet, Take 300 mg by mouth daily., Disp: , Rfl: ;  atorvastatin (LIPITOR) 10 MG tablet, Take 10 mg by mouth daily at 6 PM., Disp: , Rfl:  Cholecalciferol (VITAMIN D3) 2000 UNITS TABS, Take 4,000 Units by mouth 2 (two) times daily. , Disp: , Rfl: ;  citalopram (CELEXA) 20 MG tablet, Take 20 mg by mouth every morning. , Disp: , Rfl: ;  diltiazem (CARDIZEM CD) 180 MG 24 hr capsule, Take 180 mg by mouth every morning. , Disp: , Rfl: ;  docusate sodium (COLACE) 100 MG capsule, Take 100 mg by mouth daily as needed for mild constipation., Disp: , Rfl:  guaiFENesin (MUCINEX) 600 MG 12 hr tablet, Take 600 mg by mouth 2 (two) times daily., Disp: , Rfl: ;  levofloxacin (LEVAQUIN) 750 MG tablet, Take 1 tablet (750 mg total) by mouth every other day. 3 more doses. Last dose 07/08/2013, Disp: , Rfl: ;  levothyroxine (SYNTHROID, LEVOTHROID) 100 MCG tablet, Take 50 mcg by mouth daily before breakfast., Disp: , Rfl:  lidocaine-prilocaine (EMLA) cream, Apply topically as needed. Apply to port a cath site one hour before needle stick as needed., Disp: 30 g, Rfl: 2;  magnesium oxide (MAG-OX) 400 (241.3 MG) MG tablet, Take 0.5 tablets (200 mg total) by  mouth daily., Disp: 30 tablet, Rfl: 0;  ondansetron (ZOFRAN) 8 MG tablet, Take 8 mg by mouth 2 (two) times daily as needed for nausea or vomiting., Disp: , Rfl:  polyethylene glycol (MIRALAX / GLYCOLAX) packet, Take 17 g by mouth daily., Disp: , Rfl: ;  pravastatin (PRAVACHOL) 40 MG tablet, Take 40 mg by mouth at bedtime. , Disp: , Rfl: ;  senna (SENOKOT) 8.6 MG TABS tablet, Take 2 tablets (17.2 mg total) by mouth daily., Disp: , Rfl: ;  tiotropium (SPIRIVA) 18 MCG inhalation capsule, Place 18 mcg into inhaler and inhale daily., Disp: , Rfl:  URELLE (URELLE/URISED) 81 MG TABS tablet, Take 1 tablet (81 mg total) by mouth 3 (three) times daily as needed for bladder spasms., Disp: , Rfl: ;  warfarin (COUMADIN) 5 MG tablet, Take 1 tablet (5 mg total) by mouth daily at 6 PM., Disp: , Rfl:  Current facility-administered medications:0.9 %  sodium chloride infusion, , Intravenous, Once, Koreen D Morgan, PA-C;  cefTRIAXone (ROCEPHIN) 1 g in dextrose 5 % 50 mL IVPB, 1 g, Intravenous, Once, Hedy Jacob, PA-C   Results for orders placed during the hospital encounter of 06/26/13  URINE CULTURE      Result Value Ref Range   Specimen Description URINE, CATHETERIZED     Special Requests NONE     Culture  Setup Time       Value: 06/27/2013 00:13     Performed at [facility] Count       Value: >=100,000 COLONIES/ML     Performed at [facility]   Culture       Value: [place]     Performed at [facility]   Report Status 06/30/2013 FINAL     Organism ID, Bacteria KLEBSIELLA PNEUMONIAE     Organism ID, Bacteria CITROBACTER FREUNDII    MRSA PCR SCREENING      Result Value Ref Range   MRSA by PCR NEGATIVE  NEGATIVE  CBC WITH DIFFERENTIAL      Result Value Ref Range   WBC 11.4 (*) 4.0 - 10.5 K/uL   RBC 3.86 (*) 3.87 - 5.11 MIL/uL   Hemoglobin 11.7 (*) 12.0 - 15.0 g/dL   HCT 36.9  36.0 - 46.0 %   MCV 95.6  78.0 - 100.0 fL   MCH  30.3  26.0 - 34.0 pg   MCHC 31.7  30.0 - 36.0 g/dL   RDW 15.9 (*) 11.5 - 15.5 %   Platelets 185  150 - 400 K/uL   Neutrophils Relative % 81 (*)  43 - 77 %   Neutro Abs 9.3 (*) 1.7 - 7.7 K/uL   Lymphocytes Relative 6 (*) 12 - 46 %   Lymphs Abs 0.7  0.7 - 4.0 K/uL   Monocytes Relative 13 (*) 3 - 12 %   Monocytes Absolute 1.5 (*) 0.1 - 1.0 K/uL   Eosinophils Relative 0  0 - 5 %   Eosinophils Absolute 0.0  0.0 - 0.7 K/uL   Basophils Relative 0  0 - 1 %   Basophils Absolute 0.0  0.0 - 0.1 K/uL  COMPREHENSIVE METABOLIC PANEL      Result Value Ref Range   Sodium 137  137 - 147 mEq/L   Potassium 4.4  3.7 - 5.3 mEq/L   Chloride 95 (*) 96 - 112 mEq/L   CO2 25  19 - 32 mEq/L   Glucose, Bld 184 (*) 70 - 99 mg/dL   BUN 25 (*) 6 - 23 mg/dL   Creatinine, Ser 1.95 (*) 0.50 - 1.10 mg/dL   Calcium 10.1  8.4 - 10.5 mg/dL   Total Protein 6.7  6.0 - 8.3 g/dL   Albumin 3.5  3.5 - 5.2 g/dL   AST 17  0 - 37 U/L   ALT 11  0 - 35 U/L   Alkaline Phosphatase 54  39 - 117 U/L   Total Bilirubin 0.4  0.3 - 1.2 mg/dL   GFR calc non Af Amer 24 (*) >90 mL/min   GFR calc Af Amer 28 (*) >90 mL/min  URINALYSIS, ROUTINE W REFLEX MICROSCOPIC      Result Value Ref Range   Color, Urine YELLOW  YELLOW   APPearance CLOUDY (*) CLEAR   Specific Gravity, Urine 1.014  1.005 - 1.030   pH 6.0  5.0 - 8.0   Glucose, UA NEGATIVE  NEGATIVE mg/dL   Hgb urine dipstick SMALL (*) NEGATIVE   Bilirubin Urine NEGATIVE  NEGATIVE   Ketones, ur NEGATIVE  NEGATIVE mg/dL   Protein, ur 100 (*) NEGATIVE mg/dL   Urobilinogen, UA 0.2  0.0 - 1.0 mg/dL   Nitrite NEGATIVE  NEGATIVE   Leukocytes, UA SMALL (*) NEGATIVE  TROPONIN I      Result Value Ref Range   Troponin I <0.30  <0.30 ng/mL  PROTIME-INR      Result Value Ref Range   Prothrombin Time 14.4  11.6 - 15.2 seconds   INR 1.14  0.00 - 1.49  URINE MICROSCOPIC-ADD ON      Result Value Ref Range   Squamous Epithelial / LPF RARE  RARE   WBC, UA TOO NUMEROUS TO COUNT  <3 WBC/hpf    Bacteria, UA MANY (*) RARE  GLUCOSE, CAPILLARY      Result Value Ref Range   Glucose-Capillary 129 (*) 70 - 99 mg/dL  GLUCOSE, CAPILLARY      Result Value Ref Range   Glucose-Capillary 209 (*) 70 - 99 mg/dL   Comment 1 Documented in Chart     Comment 2 Notify RN    GLUCOSE, CAPILLARY      Result Value Ref Range   Glucose-Capillary 136 (*) 70 - 99 mg/dL  CBC      Result Value Ref Range   WBC 8.6  4.0 - 10.5 K/uL   RBC 3.22 (*) 3.87 - 5.11 MIL/uL   Hemoglobin 10.0 (*) 12.0 - 15.0 g/dL   HCT 30.4 (*) 36.0 - 46.0 %   MCV 94.4  78.0 - 100.0 fL   MCH 31.1  26.0 -  34.0 pg   MCHC 32.9  30.0 - 36.0 g/dL   RDW 15.8 (*) 11.5 - 15.5 %   Platelets 137 (*) 150 - 400 K/uL  BASIC METABOLIC PANEL      Result Value Ref Range   Sodium 138  137 - 147 mEq/L   Potassium 3.9  3.7 - 5.3 mEq/L   Chloride 101  96 - 112 mEq/L   CO2 23  19 - 32 mEq/L   Glucose, Bld 147 (*) 70 - 99 mg/dL   BUN 23  6 - 23 mg/dL   Creatinine, Ser 1.87 (*) 0.50 - 1.10 mg/dL   Calcium 8.8  8.4 - 10.5 mg/dL   GFR calc non Af Amer 26 (*) >90 mL/min   GFR calc Af Amer 30 (*) >90 mL/min  GLUCOSE, CAPILLARY      Result Value Ref Range   Glucose-Capillary 149 (*) 70 - 99 mg/dL  CBC      Result Value Ref Range   WBC 6.6  4.0 - 10.5 K/uL   RBC 3.05 (*) 3.87 - 5.11 MIL/uL   Hemoglobin 9.2 (*) 12.0 - 15.0 g/dL   HCT 29.1 (*) 36.0 - 46.0 %   MCV 95.4  78.0 - 100.0 fL   MCH 30.2  26.0 - 34.0 pg   MCHC 31.6  30.0 - 36.0 g/dL   RDW 15.8 (*) 11.5 - 15.5 %   Platelets 122 (*) 150 - 400 K/uL  BASIC METABOLIC PANEL      Result Value Ref Range   Sodium 135 (*) 137 - 147 mEq/L   Potassium 3.6 (*) 3.7 - 5.3 mEq/L   Chloride 99  96 - 112 mEq/L   CO2 23  19 - 32 mEq/L   Glucose, Bld 121 (*) 70 - 99 mg/dL   BUN 23  6 - 23 mg/dL   Creatinine, Ser 1.87 (*) 0.50 - 1.10 mg/dL   Calcium 8.6  8.4 - 10.5 mg/dL   GFR calc non Af Amer 26 (*) >90 mL/min   GFR calc Af Amer 30 (*) >90 mL/min  PROTIME-INR      Result Value Ref Range    Prothrombin Time 16.4 (*) 11.6 - 15.2 seconds   INR 1.36  0.00 - 1.49  GLUCOSE, CAPILLARY      Result Value Ref Range   Glucose-Capillary 132 (*) 70 - 99 mg/dL  HEPARIN LEVEL (UNFRACTIONATED)      Result Value Ref Range   Heparin Unfractionated 0.12 (*) 0.30 - 0.70 IU/mL  GLUCOSE, CAPILLARY      Result Value Ref Range   Glucose-Capillary 113 (*) 70 - 99 mg/dL   Comment 1 Documented in Chart     Comment 2 Notify RN    GLUCOSE, CAPILLARY      Result Value Ref Range   Glucose-Capillary 121 (*) 70 - 99 mg/dL   Comment 1 Documented in Chart     Comment 2 Notify RN    HEPARIN LEVEL (UNFRACTIONATED)      Result Value Ref Range   Heparin Unfractionated 0.15 (*) 0.30 - 0.70 IU/mL  GLUCOSE, CAPILLARY      Result Value Ref Range   Glucose-Capillary 101 (*) 70 - 99 mg/dL  GLUCOSE, CAPILLARY      Result Value Ref Range   Glucose-Capillary 96  70 - 99 mg/dL   Comment 1 Documented in Chart    HEPARIN LEVEL (UNFRACTIONATED)      Result Value Ref Range   Heparin Unfractionated <0.10 (*)  0.30 - 0.70 IU/mL  GLUCOSE, CAPILLARY      Result Value Ref Range   Glucose-Capillary 136 (*) 70 - 99 mg/dL  GLUCOSE, CAPILLARY      Result Value Ref Range   Glucose-Capillary 177 (*) 70 - 99 mg/dL  PROTIME-INR      Result Value Ref Range   Prothrombin Time 15.4 (*) 11.6 - 15.2 seconds   INR 1.25  0.00 - 1.49  HEPARIN LEVEL (UNFRACTIONATED)      Result Value Ref Range   Heparin Unfractionated 0.54  0.30 - 0.70 IU/mL  CBC      Result Value Ref Range   WBC 5.3  4.0 - 10.5 K/uL   RBC 3.00 (*) 3.87 - 5.11 MIL/uL   Hemoglobin 9.1 (*) 12.0 - 15.0 g/dL   HCT 28.0 (*) 36.0 - 46.0 %   MCV 93.3  78.0 - 100.0 fL   MCH 30.3  26.0 - 34.0 pg   MCHC 32.5  30.0 - 36.0 g/dL   RDW 15.5  11.5 - 15.5 %   Platelets 126 (*) 150 - 400 K/uL  BASIC METABOLIC PANEL      Result Value Ref Range   Sodium 136 (*) 137 - 147 mEq/L   Potassium 3.5 (*) 3.7 - 5.3 mEq/L   Chloride 101  96 - 112 mEq/L   CO2 22  19 - 32 mEq/L    Glucose, Bld 127 (*) 70 - 99 mg/dL   BUN 25 (*) 6 - 23 mg/dL   Creatinine, Ser 1.92 (*) 0.50 - 1.10 mg/dL   Calcium 8.6  8.4 - 10.5 mg/dL   GFR calc non Af Amer 25 (*) >90 mL/min   GFR calc Af Amer 29 (*) >90 mL/min  GLUCOSE, CAPILLARY      Result Value Ref Range   Glucose-Capillary 136 (*) 70 - 99 mg/dL  GLUCOSE, CAPILLARY      Result Value Ref Range   Glucose-Capillary 117 (*) 70 - 99 mg/dL  HEPARIN LEVEL (UNFRACTIONATED)      Result Value Ref Range   Heparin Unfractionated 0.65  0.30 - 0.70 IU/mL  GLUCOSE, CAPILLARY      Result Value Ref Range   Glucose-Capillary 114 (*) 70 - 99 mg/dL  GLUCOSE, CAPILLARY      Result Value Ref Range   Glucose-Capillary 166 (*) 70 - 99 mg/dL  HEPARIN LEVEL (UNFRACTIONATED)      Result Value Ref Range   Heparin Unfractionated 0.61  0.30 - 0.70 IU/mL  PROTIME-INR      Result Value Ref Range   Prothrombin Time 17.1 (*) 11.6 - 15.2 seconds   INR 1.43  0.00 - 1.49  HEPARIN LEVEL (UNFRACTIONATED)      Result Value Ref Range   Heparin Unfractionated 0.68  0.30 - 0.70 IU/mL  CBC      Result Value Ref Range   WBC 3.4 (*) 4.0 - 10.5 K/uL   RBC 3.28 (*) 3.87 - 5.11 MIL/uL   Hemoglobin 10.0 (*) 12.0 - 15.0 g/dL   HCT 30.1 (*) 36.0 - 46.0 %   MCV 91.8  78.0 - 100.0 fL   MCH 30.5  26.0 - 34.0 pg   MCHC 33.2  30.0 - 36.0 g/dL   RDW 15.4  11.5 - 15.5 %   Platelets 138 (*) 150 - 400 K/uL  BASIC METABOLIC PANEL      Result Value Ref Range   Sodium 138  137 - 147 mEq/L   Potassium 3.3 (*)  3.7 - 5.3 mEq/L   Chloride 101  96 - 112 mEq/L   CO2 23  19 - 32 mEq/L   Glucose, Bld 121 (*) 70 - 99 mg/dL   BUN 24 (*) 6 - 23 mg/dL   Creatinine, Ser 1.87 (*) 0.50 - 1.10 mg/dL   Calcium 8.8  8.4 - 10.5 mg/dL   GFR calc non Af Amer 26 (*) >90 mL/min   GFR calc Af Amer 30 (*) >90 mL/min  GLUCOSE, CAPILLARY      Result Value Ref Range   Glucose-Capillary 115 (*) 70 - 99 mg/dL  GLUCOSE, CAPILLARY      Result Value Ref Range   Glucose-Capillary 129 (*) 70 - 99  mg/dL   Comment 1 Documented in Chart     Comment 2 Notify RN    GLUCOSE, CAPILLARY      Result Value Ref Range   Glucose-Capillary 114 (*) 70 - 99 mg/dL  GLUCOSE, CAPILLARY      Result Value Ref Range   Glucose-Capillary 161 (*) 70 - 99 mg/dL  CBC      Result Value Ref Range   WBC 4.6  4.0 - 10.5 K/uL   RBC 3.42 (*) 3.87 - 5.11 MIL/uL   Hemoglobin 10.4 (*) 12.0 - 15.0 g/dL   HCT 31.4 (*) 36.0 - 46.0 %   MCV 91.8  78.0 - 100.0 fL   MCH 30.4  26.0 - 34.0 pg   MCHC 33.1  30.0 - 36.0 g/dL   RDW 15.3  11.5 - 15.5 %   Platelets 148 (*) 150 - 400 K/uL  PROTIME-INR      Result Value Ref Range   Prothrombin Time 17.1 (*) 11.6 - 15.2 seconds   INR 1.43  0.00 - 1.49  CBC      Result Value Ref Range   WBC 3.8 (*) 4.0 - 10.5 K/uL   RBC 3.06 (*) 3.87 - 5.11 MIL/uL   Hemoglobin 9.1 (*) 12.0 - 15.0 g/dL   HCT 28.1 (*) 36.0 - 46.0 %   MCV 91.8  78.0 - 100.0 fL   MCH 29.7  26.0 - 34.0 pg   MCHC 32.4  30.0 - 36.0 g/dL   RDW 15.4  11.5 - 15.5 %   Platelets 138 (*) 150 - 400 K/uL  BASIC METABOLIC PANEL      Result Value Ref Range   Sodium 139  137 - 147 mEq/L   Potassium 3.4 (*) 3.7 - 5.3 mEq/L   Chloride 100  96 - 112 mEq/L   CO2 26  19 - 32 mEq/L   Glucose, Bld 132 (*) 70 - 99 mg/dL   BUN 22  6 - 23 mg/dL   Creatinine, Ser 1.71 (*) 0.50 - 1.10 mg/dL   Calcium 9.3  8.4 - 10.5 mg/dL   GFR calc non Af Amer 29 (*) >90 mL/min   GFR calc Af Amer 33 (*) >90 mL/min  GLUCOSE, CAPILLARY      Result Value Ref Range   Glucose-Capillary 155 (*) 70 - 99 mg/dL  GLUCOSE, CAPILLARY      Result Value Ref Range   Glucose-Capillary 147 (*) 70 - 99 mg/dL  GLUCOSE, CAPILLARY      Result Value Ref Range   Glucose-Capillary 132 (*) 70 - 99 mg/dL  GLUCOSE, CAPILLARY      Result Value Ref Range   Glucose-Capillary 152 (*) 70 - 99 mg/dL  PROTIME-INR      Result Value Ref Range  Prothrombin Time 16.7 (*) 11.6 - 15.2 seconds   INR 1.39  0.00 - 1.49  CBC      Result Value Ref Range   WBC 3.8 (*)  4.0 - 10.5 K/uL   RBC 3.12 (*) 3.87 - 5.11 MIL/uL   Hemoglobin 9.4 (*) 12.0 - 15.0 g/dL   HCT 28.4 (*) 36.0 - 46.0 %   MCV 91.0  78.0 - 100.0 fL   MCH 30.1  26.0 - 34.0 pg   MCHC 33.1  30.0 - 36.0 g/dL   RDW 15.4  11.5 - 15.5 %   Platelets 161  150 - 400 K/uL  BASIC METABOLIC PANEL      Result Value Ref Range   Sodium 138  137 - 147 mEq/L   Potassium 3.6 (*) 3.7 - 5.3 mEq/L   Chloride 100  96 - 112 mEq/L   CO2 27  19 - 32 mEq/L   Glucose, Bld 115 (*) 70 - 99 mg/dL   BUN 19  6 - 23 mg/dL   Creatinine, Ser 1.57 (*) 0.50 - 1.10 mg/dL   Calcium 9.5  8.4 - 10.5 mg/dL   GFR calc non Af Amer 32 (*) >90 mL/min   GFR calc Af Amer 37 (*) >90 mL/min  GLUCOSE, CAPILLARY      Result Value Ref Range   Glucose-Capillary 152 (*) 70 - 99 mg/dL  GLUCOSE, CAPILLARY      Result Value Ref Range   Glucose-Capillary 202 (*) 70 - 99 mg/dL  GLUCOSE, CAPILLARY      Result Value Ref Range   Glucose-Capillary 105 (*) 70 - 99 mg/dL  GLUCOSE, CAPILLARY      Result Value Ref Range   Glucose-Capillary 152 (*) 70 - 99 mg/dL   Comment 1 Notify RN     Comment 2 Documented in Chart    GLUCOSE, CAPILLARY      Result Value Ref Range   Glucose-Capillary 131 (*) 70 - 99 mg/dL  PROTIME-INR      Result Value Ref Range   Prothrombin Time 16.4 (*) 11.6 - 15.2 seconds   INR 1.36  0.00 - 99991111  BASIC METABOLIC PANEL      Result Value Ref Range   Sodium 141  137 - 147 mEq/L   Potassium 3.5 (*) 3.7 - 5.3 mEq/L   Chloride 100  96 - 112 mEq/L   CO2 28  19 - 32 mEq/L   Glucose, Bld 134 (*) 70 - 99 mg/dL   BUN 18  6 - 23 mg/dL   Creatinine, Ser 1.64 (*) 0.50 - 1.10 mg/dL   Calcium 9.9  8.4 - 10.5 mg/dL   GFR calc non Af Amer 30 (*) >90 mL/min   GFR calc Af Amer 35 (*) >90 mL/min  GLUCOSE, CAPILLARY      Result Value Ref Range   Glucose-Capillary 158 (*) 70 - 99 mg/dL  GLUCOSE, CAPILLARY      Result Value Ref Range   Glucose-Capillary 130 (*) 70 - 99 mg/dL  GLUCOSE, CAPILLARY      Result Value Ref Range    Glucose-Capillary 131 (*) 70 - 99 mg/dL  I-STAT CG4 LACTIC ACID, ED      Result Value Ref Range   Lactic Acid, Venous 1.55  0.5 - 2.2 mmol/L  CBG MONITORING, ED      Result Value Ref Range   Glucose-Capillary 206 (*) 70 - 99 mg/dL   07/14/13 labs pending Review of Systems  Constitutional: Negative for  fever and chills.  Respiratory: Positive for cough. Negative for hemoptysis and shortness of breath.   Cardiovascular: Negative for chest pain.  Gastrointestinal: Positive for constipation. Negative for nausea, vomiting and abdominal pain.  Genitourinary: Positive for flank pain. Negative for dysuria and hematuria.  Musculoskeletal: Positive for back pain.  Neurological: Negative for headaches.  Endo/Heme/Allergies: Bruises/bleeds easily.    Blood pressure 105/57, pulse 78, temperature 98 F (36.7 C), temperature source Oral, resp. rate 18, SpO2 93.00%. Physical Exam  Constitutional: She is oriented to person, place, and time. She appears well-developed and well-nourished.  Cardiovascular: Normal rate and regular rhythm.   Left chest wall pacemaker  Respiratory: Effort normal.  Sl dim BS bases; rt subcl PAC intact  GI: Soft. Bowel sounds are normal.  Obese; mild rt CVA tenderness  Genitourinary:  Intact rt PCN with small amt light pink urine in bag  Musculoskeletal: Normal range of motion.  Trace LE edema  Neurological: She is alert and oriented to person, place, and time.     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 Details/risks of procedure d/w pt with her understanding and consent.   Lavetta Geier,D KEVIN 07/14/2013, 10:05 AM

## 2013-07-17 ENCOUNTER — Non-Acute Institutional Stay (SKILLED_NURSING_FACILITY): Payer: Medicare Other | Admitting: Adult Health

## 2013-07-17 DIAGNOSIS — K5900 Constipation, unspecified: Secondary | ICD-10-CM

## 2013-07-19 ENCOUNTER — Ambulatory Visit (HOSPITAL_BASED_OUTPATIENT_CLINIC_OR_DEPARTMENT_OTHER): Payer: Medicare Other | Admitting: Internal Medicine

## 2013-07-19 ENCOUNTER — Telehealth: Payer: Self-pay

## 2013-07-19 ENCOUNTER — Telehealth: Payer: Self-pay | Admitting: Internal Medicine

## 2013-07-19 ENCOUNTER — Other Ambulatory Visit (HOSPITAL_BASED_OUTPATIENT_CLINIC_OR_DEPARTMENT_OTHER): Payer: Medicare Other

## 2013-07-19 VITALS — BP 127/77 | HR 86 | Temp 98.1°F | Resp 17 | Ht <= 58 in | Wt 214.2 lb

## 2013-07-19 DIAGNOSIS — C8593 Non-Hodgkin lymphoma, unspecified, intra-abdominal lymph nodes: Secondary | ICD-10-CM

## 2013-07-19 DIAGNOSIS — C8583 Other specified types of non-Hodgkin lymphoma, intra-abdominal lymph nodes: Secondary | ICD-10-CM

## 2013-07-19 DIAGNOSIS — R944 Abnormal results of kidney function studies: Secondary | ICD-10-CM

## 2013-07-19 DIAGNOSIS — D649 Anemia, unspecified: Secondary | ICD-10-CM

## 2013-07-19 DIAGNOSIS — C8330 Diffuse large B-cell lymphoma, unspecified site: Secondary | ICD-10-CM

## 2013-07-19 LAB — CBC WITH DIFFERENTIAL/PLATELET
BASO%: 0.7 % (ref 0.0–2.0)
Basophils Absolute: 0.1 10*3/uL (ref 0.0–0.1)
EOS ABS: 0.3 10*3/uL (ref 0.0–0.5)
EOS%: 3.3 % (ref 0.0–7.0)
HCT: 35.2 % (ref 34.8–46.6)
HGB: 11.1 g/dL — ABNORMAL LOW (ref 11.6–15.9)
LYMPH#: 2.1 10*3/uL (ref 0.9–3.3)
LYMPH%: 21.7 % (ref 14.0–49.7)
MCH: 28.7 pg (ref 25.1–34.0)
MCHC: 31.5 g/dL (ref 31.5–36.0)
MCV: 91 fL (ref 79.5–101.0)
MONO#: 1.5 10*3/uL — AB (ref 0.1–0.9)
MONO%: 15.5 % — ABNORMAL HIGH (ref 0.0–14.0)
NEUT%: 58.8 % (ref 38.4–76.8)
NEUTROS ABS: 5.7 10*3/uL (ref 1.5–6.5)
Platelets: 343 10*3/uL (ref 145–400)
RBC: 3.87 10*6/uL (ref 3.70–5.45)
RDW: 15.6 % — AB (ref 11.2–14.5)
WBC: 9.7 10*3/uL (ref 3.9–10.3)

## 2013-07-19 LAB — COMPREHENSIVE METABOLIC PANEL (CC13)
ALBUMIN: 3.5 g/dL (ref 3.5–5.0)
ALT: 12 U/L (ref 0–55)
ANION GAP: 13 meq/L — AB (ref 3–11)
AST: 24 U/L (ref 5–34)
Alkaline Phosphatase: 89 U/L (ref 40–150)
BILIRUBIN TOTAL: 0.24 mg/dL (ref 0.20–1.20)
BUN: 23.5 mg/dL (ref 7.0–26.0)
CO2: 30 meq/L — AB (ref 22–29)
Calcium: 11.9 mg/dL — ABNORMAL HIGH (ref 8.4–10.4)
Chloride: 95 mEq/L — ABNORMAL LOW (ref 98–109)
Creatinine: 2.1 mg/dL — ABNORMAL HIGH (ref 0.6–1.1)
GLUCOSE: 181 mg/dL — AB (ref 70–140)
POTASSIUM: 4 meq/L (ref 3.5–5.1)
SODIUM: 137 meq/L (ref 136–145)
Total Protein: 7.1 g/dL (ref 6.4–8.3)

## 2013-07-19 LAB — LACTATE DEHYDROGENASE (CC13): LDH: 337 U/L — ABNORMAL HIGH (ref 125–245)

## 2013-07-19 NOTE — Telephone Encounter (Signed)
Gave pt appt for lab and MD  for April 2015 °

## 2013-07-19 NOTE — Telephone Encounter (Signed)
S/w [name] about appt times. Pt is currently at [facility] [place]

## 2013-07-21 NOTE — Progress Notes (Signed)
[place], [place] [address] [place] [address]  DIAGNOSIS: Diffuse large B cell lymphoma - Plan: Basic metabolic panel (Bmet) - [facility], Magnesium - [facility], Protime-INR, Comprehensive metabolic panel (Cmet) - [facility], Lactate dehydrogenase (LDH) - [facility], CBC with Differential  Non-Hodgkin's lymphoma of abdomen  Chief Complaint  Patient presents with  . Follow-up    CURRENT TREATMENT: None.  Allowing recovery from recent hospitalization.  We will consider Revlimid plus rituxan as a 4th line salvage therapy.      Non-Hodgkin's lymphoma of abdomen   09/23/2010 Imaging CT abdomen and pelvis: 5.5 x 6 cm irregular right abdominal mass with adjacent small lymph nodes likely representing neoplasm.  This does not appear to connect to hollow or solid viscera and may represent a sarcoma.    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    11/03/2010 Initial Diagnosis Non-Hodgkin's lymphoma of abdomen   11/24/2010 Bone Marrow Biopsy No involvment of lymphoma identified.    11/25/2010 Cancer Staging PET/CT: 1.  Interval enlargement of a hypermetabolic ileocolic mesenteric nodal mass. 2.  Hypermetabolic focus corresponding to an enlarging precaval node.  This is consistent with active lymphoma. 3.  No evidence of extra-abdominal disease.   12/08/2010 Procedure R port-a-catheter placment   12/11/2010 - 03/27/2011 Chemotherapy Q 3 week R-CHOP x 6 cyles   04/27/2011 Cancer Staging PET/CT:  1.  Complete response to chemotherapy with no hypermetabolic activity associated with the mesenteric mass or prevascular lymph nodes which are also reduced in size.   05/28/2011 - 06/26/2011 Radiation Therapy Consolidative XRT [name]). Right abdomen and right pelvis/ 3060 cGy in 17 fractions   10/12/2011 Imaging CT C/A/P: 1.  Interval  increase in size of aortocaval lymph node measuring 12 mm compared to 6 mm on prior.  This is concerning for mild disease progression. 2.  Mesenteric mass in the ileocecal mesentery is decreased slightly in size.3.  Spleen is normal.   01/18/2012 Imaging CT C/A/P: 1.  Slight interval decrease in size of the mesenteric mass. 2.  Enlarging retroperitoneal lymph node. 3.  No new lymphadenopathy.   05/23/2012 Imaging CT of abdomen: 1.  Significant interval enlargement of a retroperitoneal LN  adjacent to the IVC and infrarenal abdominal aorta, which currently measures 3.9 x 2.9 cm.  Findings are compatible with progression of disease.  No new lymphadenopathy.    05/23/2012 Relapse/Recurrence She had relapsed disease.   06/29/2012 - 08/25/2012 Chemotherapy Completed 3 cycles of bendamustine/Rituxan on 06/29/2012.    08/25/2012 Progression Had disease progression while on Bendamustine/Rituxan   10/03/2012 Procedure Biopsy of R. Perotoneal mass   10/03/2012 Pathology Pathology consistent with high grade NH B-cell lymphoma   10/26/2012 - 03/14/2013 Chemotherapy She had salvage chemotherapy with R-GPD q 3 weeks.  Completed 5 cyles.    02/11/2013 - 02/24/2013 Hospital Admission Admitted secondary to febrile neutropenia.  Her infectious work-up was negative and her thrombocytopenia was prolonged.  She received antibiotics and plts transfusion.    03/27/2013 Imaging CT C/A/P on 12/15 was consistent with mild disease progression.    06/05/2013 Progression CT C/A/P consistent with moderate disease progression plus R hydronephosis (moderate,severe).  Planning R-ICE salvage chemotherapy followed by transplant [name], Dr. Cassell Clement). Referral to Urology   06/26/2013 - 07/03/2013 Hospital Admission Hospitalized with AMS, fevers and dysuria.  Treated for urosepsis with broad spectrum antibiotics.  Perc nephrostomy placed on right kidney due to hydronephrosis.  Discharged to rehab.    07/14/2013 Procedure Had Perc nephrostomy removed with  placement of right ureteral stent.     INTERVAL HISTORY: [name] Study 73 y.o. female returns for follow up visit. She was last seen by me on 06/12/2013.  Today, she is accompanied by her nieces [name] and [name].  She remains at [facility]. As noted above she had a prolonged hospitalization secondary to AMS, renal failure and probable urosepsis.  She had right percutaneous nephrostomy tube placed and this was exchanged successfully on 07/14/2013.  She reports continued weakness but good urinary output.    She denies headaches or visual changes. She is a former longstanding smoker who quit over the past year. She denied headaches, double vision, blurry vision, nasal discharge, hearing problems, odynophagia or dysphagia. No chest pain, palpitations, dyspnea, cough, abdominal pain, vomiting, diarrhea, constipation, hematochezia. The patient denied dysuria, nocturia, polyuria, hematuria, myalgia, numbness, tingling, psychiatric problems. She denies any further bleeding episodes including epitaxis. She denies any fevers or chills. She reports a good appetite.   MEDICAL HISTORY: Past Medical History  Diagnosis Date  . Sick sinus syndrome   . Hematoma     At the site of the pacemaker insertion.  . Hypotension   . Dyslipidemia   . History of atrial fibrillation   . Arthritis     Osteoarthritis  . Port-a-cath in place 12/08/10  . Status post chemotherapy completed 03/2011    R - CHOP q 3 weeks x 6  . S/P radiation therapy 05/28/2011 - 06/26/2011    Right Abdomen and Right Pelvis/3060 cGy in 17 Fractions  . Hypertension   . ASHD (arteriosclerotic heart disease)   . Gout   . COPD (chronic obstructive pulmonary disease)   . Asthma   . Hyperlipidemia   . Diabetes mellitus   . Hypothyroidism   . Diffuse large B cell lymphoma     Dr. Chisom,oncology  . Cancer 10/2010    large B cell lymphoma  . 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 Pacemaker      left chest  . Dysrhythmia     Hx. A. Fib, sick sinus syndrome- Pacemaker inserted.  . Cataracts, bilateral     INTERIM HISTORY: has Essential hypertension, benign; Cardiac pacemaker in situ; Diffuse large B cell lymphoma; Hypothyroidism; Diabetes mellitus type 2 in obese; Hyperlipidemia; Non-Hodgkin's lymphoma of abdomen; Depression; Fever and neutropenia; Antineoplastic chemotherapy induced pancytopenia; Acute renal failure; Anemia of chronic disease; Chronic bronchitis; ASHD (arteriosclerotic heart disease); Gout; COPD (chronic obstructive pulmonary disease); Asthma; Atrial fibrillation with RVR; Hydronephrosis, right; UTI (lower urinary tract infection); Severe sepsis; Sepsis secondary to UTI; Constipation; Anticoagulation management encounter; and Generalized weakness on her problem list.    ALLERGIES:  is allergic to codeine and penicillins.  MEDICATIONS: has a current medication list which includes the following prescription(s): albuterol, allopurinol, atorvastatin, vitamin d3, citalopram, diltiazem, guaifenesin, levothyroxine, lidocaine-prilocaine, magnesium oxide, ondansetron, polyethylene glycol, sennosides-docusate sodium, tiotropium, urelle, warfarin, and glycerin (pediatric).  SURGICAL HISTORY:  Past Surgical History  Procedure Laterality Date  . Pacemaker insertion      Lead revision completed August 04, 2006- left chest G. Lovena Le  . Tubal ligation      Bilateral  . Insert / replace / remove pacemaker    . Abdominal hysterectomy    . Biopsy stomach  11/03/10    Soft Tissue Mass, Biopsy, Right Lower Quadrant Mesenteric Mass- High  Grade Non-Hodgkins B Cell Lymphoma  with Flow Cytometry  . Bone biopsy  11/24/10    Bone Marrow, Aspirate Biospy, and clot, Left - No Involvement of Non-Hodgkin's Lymphoma Identified  . Portacath placement Right 06-23-13  . Cholecystectomy      laparoscopic  . Bladder suspension      REVIEW OF SYSTEMS:   Constitutional: Denies fevers, chills or  abnormal weight loss Eyes: Denies blurriness of vision Ears, nose, mouth, throat, and face: Denies mucositis or sore throat Respiratory: Reports a chronic non-productive cough, but denies dyspnea or wheezes Cardiovascular: Denies palpitation, chest discomfort or lower extremity swelling Gastrointestinal:  Denies nausea, heartburn or change in bowel habits Skin: Denies abnormal skin rashes Lymphatics: Denies new lymphadenopathy or easy bruising Neurological:Denies numbness, tingling or new weaknesses Behavioral/Psych: Mood is stable, no new changes  All other systems were reviewed with the patient and are negative.  PHYSICAL EXAMINATION: ECOG PERFORMANCE STATUS: 1 - Symptomatic but completely ambulatory  Blood pressure 127/77, pulse 86, temperature 98.1 F (36.7 C), temperature source Oral, resp. rate 17, height _0  (1.448 m), weight 214 lb 3.2 oz (97.16 kg), SpO2 97.00%.  GENERAL:alert, no distress and comfortable; Obese lady seating in her wheelchair, chronically ill appearing. SKIN: skin color, texture, turgor are normal, no rashes or significant lesions; Port a cath on R.  EYES: normal, Conjunctiva are pink and non-injected, sclera clear OROPHARYNX:no exudate, no erythema and lips, buccal mucosa, and tongue normal  NECK: supple, thyroid normal size, non-tender, without nodularity LYMPH:  no palpable lymphadenopathy in the cervical, axillary or supraclavicular LUNGS: clear to auscultation with normal breathing effort, no wheezes or rhonchi HEART: regular rate & rhythm and no murmurs and no lower extremity edema ABDOMEN:abdomen soft, non-tender and normal bowel sounds Musculoskeletal:no cyanosis of digits and no clubbing  NEURO: alert & oriented x 3 with fluent speech, no focal motor/sensory deficits  Labs:  Lab Results  Component Value Date   WBC 9.7 07/19/2013   HGB 11.1* 07/19/2013   HCT 35.2 07/19/2013   MCV 91.0 07/19/2013   PLT 343 07/19/2013   NEUTROABS 5.7 07/19/2013       Chemistry      Component Value Date/Time   NA 137 07/19/2013 1521   NA 139 07/14/2013 1000   NA 141 10/12/2011 0957   K 4.0 07/19/2013 1521   K 4.3 07/14/2013 1000   K 4.4 10/12/2011 0957   CL 96 07/14/2013 1000   CL 102 09/21/2012 0917   CL 95* 10/12/2011 0957   CO2 30* 07/19/2013 1521   CO2 31 07/14/2013 1000   CO2 29 10/12/2011 0957   BUN 23.5 07/19/2013 1521   BUN 22 07/14/2013 1000   BUN 15 10/12/2011 0957   CREATININE 2.1* 07/19/2013 1521   CREATININE 1.76* 07/14/2013 1000   CREATININE 1.10 04/19/2013 1420      Component Value Date/Time   CALCIUM 11.9* 07/19/2013 1521   CALCIUM 11.7* 07/14/2013 1000   CALCIUM 9.1 10/12/2011 0957   ALKPHOS 89 07/19/2013 1521   ALKPHOS 54 06/26/2013 1635   ALKPHOS 65 10/12/2011 0957   AST 24 07/19/2013 1521   AST 17 06/26/2013 1635   AST 19 10/12/2011 0957   ALT 12 07/19/2013 1521   ALT 11 06/26/2013 1635   ALT 20 10/12/2011 0957   BILITOT 0.24 07/19/2013 1521   BILITOT 0.4 06/26/2013 1635   BILITOT 0.60 10/12/2011 0957       Basic Metabolic Panel:  Recent Labs Lab 07/14/13 1000 07/19/13 1521  NA 139 137  K 4.3 4.0  CL 96  --   CO2 31 30*  GLUCOSE 133* 181*  BUN 22 23.5  CREATININE 1.76* 2.1*  CALCIUM 11.7* 11.9*   GFR Estimated Creatinine Clearance: 23.7 ml/min (by C-G formula based on Cr of 2.1). Liver Function Tests:  Recent Labs Lab 07/19/13 1521  AST 24  ALT 12  ALKPHOS 89  BILITOT 0.24  PROT 7.1  ALBUMIN 3.5    CBC:  Recent Labs Lab 07/14/13 1000 07/19/13 1521  WBC 7.7 9.7  NEUTROABS  --  5.7  HGB 10.2* 11.1*  HCT 32.2* 35.2  MCV 91.2 91.0  PLT 249 343    Studies:  No results found.   RADIOGRAPHIC STUDIES: None  ASSESSMENT: [name] 73 y.o. female with a history of Diffuse large B cell lymphoma - Plan: Basic metabolic panel (Bmet) - [facility], Magnesium - [facility], Protime-INR, Comprehensive metabolic panel (Cmet) - [facility], Lactate dehydrogenase (LDH) - [facility], CBC with Differential  Non-Hodgkin's lymphoma of abdomen   PLAN:   1. Relapsed  high grade non-hodgkin's B cell Lymphoma on salvage therapy, progressed on salvage R-GDP  -She completed 5 cycles of R+DGP q 21 days (modified doses based on dehydration, electrolyte abnormalities) and we reviewed the CT Scans of C/A/P (2/23) which was consistent with a further moderate increase in retroperitoneal lymphadenopathy complicated by right hydronephrosis. She now has a right ureteral stent in place.  Previously we considered SCT but due to progressive decline as evident with multiple hospitalizations, we will no longer pursue SCT.  The patient was in aggreement with this plan.  We will try a less aggressive 4-th line therapy with Rituxan plus revlimid.   We hope to start this therapy once her creatinine is stable and she is recovered from her last prolonged hospitalization.    2. R. Hydronephrosis, s/p R ureteral stent (04/03) --Patient had R ureteral stent placed on 04/03.  Creatinine is trending up.  We will repeat her BMP in one week and if still going up, she will need stent exchange possibly.   3. Anemia, mild  -- She is asymptomatic for anemia.   4. Elevated creatinine secondary #2 (in part).  --Creatinine is 2.1 today (Range 1.1 to 1.6 over the past one year). We encouraged aggressive hydration and avoidance of nephrotoxins. We will continue to closely monitor her labs weekly.  5. Hypothyroidism: Levothyroxine per PCP  6. Hypertension: Diltiazem per PCP.  7. Diabetes mellitus, type II: Metformin per PCP.  8. Hyperlipidemia: Pravastatin per PCP.  9.History of atrial fibrillation: Diltiazem, pacemaker. Coumadin being managed per her PCP previously.   10. COPD/ Chronic Bronchitis exacerbation.  --Patient advised to continue albuterol q 6 hours as needed.   11. Follow-up in 2 weeks to consent and evaluation for treatment with salvage Revlimid plus rituximab.  This may be done in patinet. We will obtain CBC, CMP.  All questions were answered. The patient knows to call the clinic  with any problems, questions or concerns. We can certainly see the patient much sooner if necessary.  I spent 25 minutes counseling the patient face to face. The total time spent in the appointment was 40 minutes.    Concha Norway, MD 07/21/2013 9:47 AM

## 2013-07-23 MED ORDER — POLYETHYLENE GLYCOL 3350 17 G PO PACK: 17.0000 g | PACK | Freq: Two times a day (BID) | ORAL | Status: DC

## 2013-07-23 NOTE — Progress Notes (Signed)
Patient ID: [name], female   DOB: 07-07-1940, 73 y.o.   MRN: [id number]     [facility]  Allergies  Allergen Reactions  . Codeine Nausea Only  . Penicillins     Unknown     Chief Complaint  Patient presents with  . Acute Visit    constipation     HPI:  She is having constipation. She has had her nephrostomy tube replaced with a stent. She states she has had not had bowel movement in several days. The kub does not demonstrate an obstruction but a large amount of stool present.    Past Medical History  Diagnosis Date  . Sick sinus syndrome   . Hematoma     At the site of the pacemaker insertion.  . Hypotension   . Dyslipidemia   . History of atrial fibrillation   . Arthritis     Osteoarthritis  . Port-a-cath in place 12/08/10  . Status post chemotherapy completed 03/2011    R - CHOP q 3 weeks x 6  . S/P radiation therapy 05/28/2011 - 06/26/2011    Right Abdomen and Right Pelvis/3060 cGy in 17 Fractions  . Hypertension   . ASHD (arteriosclerotic heart disease)   . Gout   . COPD (chronic obstructive pulmonary disease)   . Asthma   . Hyperlipidemia   . Diabetes mellitus   . Hypothyroidism   . Diffuse large B cell lymphoma     Dr. Chisom,oncology  . Cancer 10/2010    large B cell lymphoma  . 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 Pacemaker     left chest  . Dysrhythmia     Hx. A. Fib, sick sinus syndrome- Pacemaker inserted.  . Cataracts, bilateral     Past Surgical History  Procedure Laterality Date  . Pacemaker insertion      Lead revision completed August 04, 2006- left chest G. Lovena Le  . Tubal ligation      Bilateral  . Insert / replace / remove pacemaker    . Abdominal hysterectomy    . Biopsy stomach  11/03/10    Soft Tissue Mass, Biopsy, Right Lower Quadrant Mesenteric Mass- High Grade Non-Hodgkins B Cell Lymphoma  with Flow Cytometry  . Bone biopsy  11/24/10    Bone Marrow, Aspirate Biospy, and clot,  Left - No Involvement of Non-Hodgkin's Lymphoma Identified  . Portacath placement Right 06-23-13  . Cholecystectomy      laparoscopic  . Bladder suspension      VITAL SIGNS BP 119/80  Pulse 79  Ht 4\' 9"  (1.448 m)  Wt 214 lb 3 oz (97.155 kg)  BMI 46.34 kg/m2   Patient's Medications  New Prescriptions   No medications on file  Previous Medications   ALBUTEROL (PROVENTIL) (2.5 MG/3ML) 0.083% NEBULIZER SOLUTION    Take 3 mLs (2.5 mg total) by nebulization every 6 (six) hours as needed for shortness of breath.   ALLOPURINOL (ZYLOPRIM) 300 MG TABLET    Take 300 mg by mouth daily.   ATORVASTATIN (LIPITOR) 10 MG TABLET    Take 10 mg by mouth daily at 6 PM.   CHOLECALCIFEROL (VITAMIN D3) 2000 UNITS TABS    Take 4,000 Units by mouth 2 (two) times daily.    CITALOPRAM (CELEXA) 20 MG TABLET    Take 20 mg by mouth every morning.    DILTIAZEM (CARDIZEM CD) 180 MG 24 HR CAPSULE    Take 180 mg by mouth every  morning.    GLYCERIN, PEDIATRIC, 1.2 G SUPP    Place 1 suppository rectally daily as needed for moderate constipation.   GUAIFENESIN (MUCINEX) 600 MG 12 HR TABLET    Take 600 mg by mouth 2 (two) times daily.   LEVOTHYROXINE (SYNTHROID, LEVOTHROID) 50 MCG TABLET    Take 50 mcg by mouth daily before breakfast.   LIDOCAINE-PRILOCAINE (EMLA) CREAM    Apply topically as needed. Apply to port a cath site one hour before needle stick as needed.   MAGNESIUM OXIDE (MAG-OX) 400 (241.3 MG) MG TABLET    Take 0.5 tablets (200 mg total) by mouth daily.   ONDANSETRON (ZOFRAN) 8 MG TABLET    Take 8 mg by mouth 2 (two) times daily as needed for nausea or vomiting.   POLYETHYLENE GLYCOL (MIRALAX / GLYCOLAX) PACKET    Take 17 g by mouth daily.   SENNOSIDES-DOCUSATE SODIUM (SENOKOT-S) 8.6-50 MG TABLET    Take 1 tablet by mouth 2 (two) times daily.   TIOTROPIUM (SPIRIVA) 18 MCG INHALATION CAPSULE    Place 18 mcg into inhaler and inhale daily.   URELLE (URELLE/URISED) 81 MG TABS TABLET    Take 1 tablet (81 mg total)  by mouth 3 (three) times daily as needed for bladder spasms.   WARFARIN (COUMADIN) 5 MG TABLET    Take 4.5 mg by mouth daily at 6 PM. 2.5mg  tab and 2 mg tab for total 4.5 mg  Modified Medications   No medications on file  Discontinued Medications   No medications on file    SIGNIFICANT DIAGNOSTIC EXAMS  06-26-13: chest x-ray: Enlargement of cardiac silhouette post pacemaker.No acute abnormalities.  07-13-13: kub: large quantity of fecal material present in the entire colon consistent with constipation    LABS REVIEWED:   06-23-13: wbc 4.0; hgb 11.3; hct 36.1; mcv 96.5; plt 241; glucose 113; bun 26; creat 1.76; k+4.3; na++141; ca++10.3  06-26-13: wbc 11.4; hgb 11.7; hct 36.9; mcv 95.6 plt 185; glucose 184; bun 25; creat 1.95; k+4.4; na++137; liver normal albumin 3.5 06-30-13: wbc 3.4; hgb 10.0; hct 30.1; mcv 91.8 plt 138; glucose 121; bun 24; creat 1.87; k+3.3; na++138; ca++8.8 07-02-13: wbc 3.8; hgb 9.4; hct 28.4; mcv 91; plt 161; glucose 115; bun 19; creat 1.57; k+3.6; na++138; ca++9.5  07-14-13: wbc 7.7; hgb 10.2; hct 32.2; mcv 91.2 plt 249; glucose 133; bun 22; creat 1.76; k+4.3  na++ 139 ca++ 11.6     Review of Systems  Constitutional: Negative for malaise/fatigue.  Eyes: Negative for blurred vision.  Respiratory: Negative for cough and shortness of breath.   Cardiovascular: Negative for chest pain, palpitations and leg swelling.  Gastrointestinal: Negative for heartburn, abdominal pain has constipation   Genitourinary: Negative for dysuria.   Musculoskeletal: Negative for joint pain and myalgias.  Skin: Negative.   Neurological: Negative for dizziness and headaches.  Psychiatric/Behavioral: Negative for depression. The patient is not nervous/anxious.       Physical Exam  Constitutional: She is oriented to person, place, and time. She appears well-developed and well-nourished. No distress.  obese  Neck: Normal range of motion. Neck supple. No JVD present. No thyromegaly  present.  Cardiovascular: Normal rate, regular rhythm and intact distal pulses.   Pulmonary/Chest: Effort normal and breath sounds normal. No respiratory distress. She has no wheezes.  Abdominal: Soft. She exhibits no distension. There is no tenderness.  Musculoskeletal: Normal range of motion. She exhibits no edema.  Has mild generalized weakness present.   Neurological: She is alert and  oriented to person, place, and time.  Skin: Skin is warm and dry. She is not diaphoretic.  Right nephrostomy tube site without signs of infection present.  Psychiatric: She has a normal mood and affect.     ASSESSMENT/ PLAN:  1. Constipation is worse: will give her a fleets enema now and will increase her miralax to twice daily will monitor        Ok Edwards NP [facility]  Contact [phone] Monday through Friday 8am- 5pm  After hours call [phone]

## 2013-07-24 ENCOUNTER — Other Ambulatory Visit (HOSPITAL_BASED_OUTPATIENT_CLINIC_OR_DEPARTMENT_OTHER): Payer: Medicare Other

## 2013-07-24 DIAGNOSIS — C8330 Diffuse large B-cell lymphoma, unspecified site: Secondary | ICD-10-CM

## 2013-07-24 DIAGNOSIS — C8583 Other specified types of non-Hodgkin lymphoma, intra-abdominal lymph nodes: Secondary | ICD-10-CM

## 2013-07-24 DIAGNOSIS — I10 Essential (primary) hypertension: Secondary | ICD-10-CM

## 2013-07-24 LAB — CBC WITH DIFFERENTIAL/PLATELET
BASO%: 0.7 % (ref 0.0–2.0)
Basophils Absolute: 0.1 10*3/uL (ref 0.0–0.1)
EOS ABS: 0.3 10*3/uL (ref 0.0–0.5)
EOS%: 3.6 % (ref 0.0–7.0)
HCT: 32 % — ABNORMAL LOW (ref 34.8–46.6)
HEMOGLOBIN: 10.4 g/dL — AB (ref 11.6–15.9)
LYMPH#: 1.3 10*3/uL (ref 0.9–3.3)
LYMPH%: 15 % (ref 14.0–49.7)
MCH: 29 pg (ref 25.1–34.0)
MCHC: 32.5 g/dL (ref 31.5–36.0)
MCV: 89.1 fL (ref 79.5–101.0)
MONO#: 1 10*3/uL — AB (ref 0.1–0.9)
MONO%: 12 % (ref 0.0–14.0)
NEUT#: 5.9 10*3/uL (ref 1.5–6.5)
NEUT%: 68.7 % (ref 38.4–76.8)
PLATELETS: 354 10*3/uL (ref 145–400)
RBC: 3.59 10*6/uL — ABNORMAL LOW (ref 3.70–5.45)
RDW: 17.4 % — ABNORMAL HIGH (ref 11.2–14.5)
WBC: 8.6 10*3/uL (ref 3.9–10.3)

## 2013-07-24 LAB — BASIC METABOLIC PANEL (CC13)
Anion Gap: 10 mEq/L (ref 3–11)
BUN: 22.8 mg/dL (ref 7.0–26.0)
CALCIUM: 12 mg/dL — AB (ref 8.4–10.4)
CO2: 30 meq/L — AB (ref 22–29)
CREATININE: 1.8 mg/dL — AB (ref 0.6–1.1)
Chloride: 99 mEq/L (ref 98–109)
Glucose: 180 mg/dl — ABNORMAL HIGH (ref 70–140)
Potassium: 3.6 mEq/L (ref 3.5–5.1)
Sodium: 140 mEq/L (ref 136–145)

## 2013-07-24 LAB — TECHNOLOGIST REVIEW

## 2013-07-24 LAB — MAGNESIUM (CC13): Magnesium: 2.3 mg/dl (ref 1.5–2.5)

## 2013-07-26 ENCOUNTER — Ambulatory Visit: Payer: Self-pay | Admitting: Internal Medicine

## 2013-07-27 ENCOUNTER — Non-Acute Institutional Stay (SKILLED_NURSING_FACILITY): Payer: Medicare Other | Admitting: Adult Health

## 2013-07-27 ENCOUNTER — Encounter: Payer: Self-pay | Admitting: Adult Health

## 2013-07-27 DIAGNOSIS — C8589 Other specified types of non-Hodgkin lymphoma, extranodal and solid organ sites: Secondary | ICD-10-CM

## 2013-07-27 DIAGNOSIS — J449 Chronic obstructive pulmonary disease, unspecified: Secondary | ICD-10-CM

## 2013-07-27 DIAGNOSIS — A419 Sepsis, unspecified organism: Secondary | ICD-10-CM

## 2013-07-27 DIAGNOSIS — C8330 Diffuse large B-cell lymphoma, unspecified site: Secondary | ICD-10-CM

## 2013-07-27 DIAGNOSIS — I4891 Unspecified atrial fibrillation: Secondary | ICD-10-CM

## 2013-07-27 DIAGNOSIS — N390 Urinary tract infection, site not specified: Secondary | ICD-10-CM

## 2013-07-27 NOTE — Progress Notes (Signed)
Patient ID: Dana [name], female   DOB: 09-04-1940, 73 y.o.   MRN: [id number]     Dana [name]  Allergies  Allergen Reactions  . Codeine Nausea Only  . Penicillins     Unknown     Chief Complaint  Patient presents with  . Discharge Note    HPI:  She is being discharged to home with home health for pt/ot/nursing/coumadin monitoring/aid. She will not any dme. She will need her prescriptions to be written.   Past Medical History  Diagnosis Date  . Sick sinus syndrome   . Hematoma     At the site of the pacemaker insertion.  . Hypotension   . Dyslipidemia   . History of atrial fibrillation   . Arthritis     Osteoarthritis  . Port-a-cath in [name] 12/08/10  . Status post chemotherapy completed 03/2011    R - CHOP q 3 weeks x 6  . S/P radiation therapy 05/28/2011 - 06/26/2011    Right Abdomen and Right Pelvis/3060 cGy in 17 Fractions  . Hypertension   . ASHD (arteriosclerotic heart disease)   . Gout   . COPD (chronic obstructive pulmonary disease)   . Asthma   . Hyperlipidemia   . Diabetes mellitus   . Hypothyroidism   . Diffuse large B cell lymphoma     Dr. Chisom,oncology  . Cancer 10/2010    large B cell lymphoma  . 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 Pacemaker     left chest  . Dysrhythmia     Hx. A. Fib, sick sinus syndrome- Pacemaker inserted.  . Cataracts, bilateral     Past Surgical History  Procedure Laterality Date  . Pacemaker insertion      Lead revision completed August 04, 2006- left chest G. Lovena Le  . Tubal ligation      Bilateral  . Insert / replace / remove pacemaker    . Abdominal hysterectomy    . Biopsy stomach  11/03/10    Soft Tissue Mass, Biopsy, Right Lower Quadrant Mesenteric Mass- High Grade Non-Hodgkins B Cell Lymphoma  with Flow Cytometry  . Bone biopsy  11/24/10    Bone Marrow, Aspirate Biospy, and clot, Left - No Involvement of Non-Hodgkin's Lymphoma Identified  . Portacath placement  Right 06-23-13  . Cholecystectomy      laparoscopic  . Bladder suspension      VITAL SIGNS BP 128/79  Pulse 69  Ht 4\' 9"  (1.448 m)  Wt 214 lb (97.07 kg)  BMI 46.30 kg/m2   Patient's Medications  New Prescriptions   No medications on file  Previous Medications   ALBUTEROL (PROVENTIL) (2.5 MG/3ML) 0.083% NEBULIZER SOLUTION    Take 3 mLs (2.5 mg total) by nebulization every 6 (six) hours as needed for shortness of breath.   ALLOPURINOL (ZYLOPRIM) 300 MG TABLET    Take 300 mg by mouth daily.   ATORVASTATIN (LIPITOR) 10 MG TABLET    Take 10 mg by mouth daily at 6 PM.   CHOLECALCIFEROL (VITAMIN D3) 2000 UNITS TABS    Take 4,000 Units by mouth 2 (two) times daily.    CITALOPRAM (CELEXA) 20 MG TABLET    Take 20 mg by mouth every morning.    DILTIAZEM (CARDIZEM CD) 180 MG 24 HR CAPSULE    Take 180 mg by mouth every morning.    GLYCERIN, PEDIATRIC, 1.2 G SUPP    [name] 1 suppository rectally daily as needed for moderate constipation.  GUAIFENESIN (MUCINEX) 600 MG 12 HR TABLET    Take 600 mg by mouth 2 (two) times daily.   LEVOTHYROXINE (SYNTHROID, LEVOTHROID) 50 MCG TABLET    Take 50 mcg by mouth daily before breakfast.   LIDOCAINE-PRILOCAINE (EMLA) CREAM    Apply topically as needed. Apply to port a cath site one hour before needle stick as needed.   MAGNESIUM OXIDE (MAG-OX) 400 (241.3 MG) MG TABLET    Take 0.5 tablets (200 mg total) by mouth daily.   ONDANSETRON (ZOFRAN) 8 MG TABLET    Take 8 mg by mouth 2 (two) times daily as needed for nausea or vomiting.   POLYETHYLENE GLYCOL (MIRALAX / GLYCOLAX) PACKET    Take 17 g by mouth 2 (two) times daily.   SENNOSIDES-DOCUSATE SODIUM (SENOKOT-S) 8.6-50 MG TABLET    Take 1 tablet by mouth 2 (two) times daily.   TIOTROPIUM (SPIRIVA) 18 MCG INHALATION CAPSULE    [name] 18 mcg into inhaler and inhale daily.   URELLE (URELLE/URISED) 81 MG TABS TABLET    Take 1 tablet (81 mg total) by mouth 3 (three) times daily as needed for bladder spasms.   WARFARIN  (COUMADIN) 5 MG TABLET    Take 5 mg daily   Modified Medications   No medications on file  Discontinued Medications   No medications on file    SIGNIFICANT DIAGNOSTIC EXAMS  06-26-13: chest x-ray: Enlargement of cardiac silhouette post pacemaker.No acute abnormalities.  07-13-13: kub: large quantity of fecal material present in the entire colon consistent with constipation    LABS REVIEWED:   06-23-13: wbc 4.0; hgb 11.3; hct 36.1; mcv 96.5; plt 241; glucose 113; bun 26; creat 1.76; k+4.3; na++141; ca++10.3  06-26-13: wbc 11.4; hgb 11.7; hct 36.9; mcv 95.6 plt 185; glucose 184; bun 25; creat 1.95; k+4.4; na++137; liver normal albumin 3.5 06-30-13: wbc 3.4; hgb 10.0; hct 30.1; mcv 91.8 plt 138; glucose 121; bun 24; creat 1.87; k+3.3; na++138; ca++8.8 07-02-13: wbc 3.8; hgb 9.4; hct 28.4; mcv 91; plt 161; glucose 115; bun 19; creat 1.57; k+3.6; na++138; ca++9.5  07-14-13: wbc 7.7; hgb 10.2; hct 32.2; mcv 91.2 plt 249; glucose 133; bun 22; creat 1.76; k+4.3  na++ 139 ca++ 11.6     Review of Systems  Constitutional: Negative for malaise/fatigue.  Eyes: Negative for blurred vision.  Respiratory: Negative for cough and shortness of breath.   Cardiovascular: Negative for chest pain, palpitations and leg swelling.  Gastrointestinal: Negative for heartburn, abdominal pain no constipation  Genitourinary: Negative for dysuria.   Musculoskeletal: Negative for joint pain and myalgias.  Skin: Negative.   Neurological: Negative for dizziness and headaches.  Psychiatric/Behavioral: Negative for depression. The patient is not nervous/anxious.       Physical Exam  Constitutional: She is oriented to person, [name], and time. She appears well-developed and well-nourished. No distress.  obese  Neck: Normal range of motion. Neck supple. No JVD present. No thyromegaly present.  Cardiovascular: Normal rate, regular rhythm and intact distal pulses.   Pulmonary/Chest: Effort normal and breath sounds normal.  No respiratory distress. She has no wheezes.  Abdominal: Soft. She exhibits no distension. There is no tenderness.  Musculoskeletal: Normal range of motion. She exhibits no edema.  Neurological: She is alert and oriented to person, [name], and time.  Skin: Skin is warm and dry. She is not diaphoretic.  Psychiatric: She has a normal mood and affect.      ASSESSMENT/ PLAN:  Will discharge her to home health for pt/ot/nursing/coumadin monitoring/aid.  Will not need dme. Her prescriptions have been written.   Time spent with patient 40 minutes.      Ok Edwards NP [facility]  Contact [phone] Monday through Friday 8am- 5pm  After hours call [phone]

## 2013-08-02 ENCOUNTER — Ambulatory Visit (HOSPITAL_BASED_OUTPATIENT_CLINIC_OR_DEPARTMENT_OTHER): Payer: Medicare Other

## 2013-08-02 ENCOUNTER — Telehealth: Payer: Self-pay | Admitting: Internal Medicine

## 2013-08-02 ENCOUNTER — Telehealth: Payer: Self-pay | Admitting: *Deleted

## 2013-08-02 ENCOUNTER — Ambulatory Visit (HOSPITAL_BASED_OUTPATIENT_CLINIC_OR_DEPARTMENT_OTHER): Payer: Medicare Other | Admitting: Internal Medicine

## 2013-08-02 ENCOUNTER — Other Ambulatory Visit: Payer: Self-pay | Admitting: Internal Medicine

## 2013-08-02 ENCOUNTER — Other Ambulatory Visit: Payer: Self-pay

## 2013-08-02 ENCOUNTER — Other Ambulatory Visit (HOSPITAL_BASED_OUTPATIENT_CLINIC_OR_DEPARTMENT_OTHER): Payer: Medicare Other

## 2013-08-02 VITALS — BP 132/41 | HR 50 | Temp 97.7°F | Resp 18

## 2013-08-02 VITALS — BP 124/61 | HR 80 | Temp 98.0°F | Resp 18 | Ht <= 58 in | Wt 208.6 lb

## 2013-08-02 DIAGNOSIS — E039 Hypothyroidism, unspecified: Secondary | ICD-10-CM

## 2013-08-02 DIAGNOSIS — C8593 Non-Hodgkin lymphoma, unspecified, intra-abdominal lymph nodes: Secondary | ICD-10-CM

## 2013-08-02 DIAGNOSIS — C8330 Diffuse large B-cell lymphoma, unspecified site: Secondary | ICD-10-CM

## 2013-08-02 DIAGNOSIS — D649 Anemia, unspecified: Secondary | ICD-10-CM

## 2013-08-02 DIAGNOSIS — I10 Essential (primary) hypertension: Secondary | ICD-10-CM

## 2013-08-02 DIAGNOSIS — N179 Acute kidney failure, unspecified: Secondary | ICD-10-CM

## 2013-08-02 DIAGNOSIS — R3989 Other symptoms and signs involving the genitourinary system: Secondary | ICD-10-CM

## 2013-08-02 DIAGNOSIS — C8590 Non-Hodgkin lymphoma, unspecified, unspecified site: Secondary | ICD-10-CM

## 2013-08-02 DIAGNOSIS — I4891 Unspecified atrial fibrillation: Secondary | ICD-10-CM

## 2013-08-02 DIAGNOSIS — C8583 Other specified types of non-Hodgkin lymphoma, intra-abdominal lymph nodes: Secondary | ICD-10-CM

## 2013-08-02 DIAGNOSIS — N390 Urinary tract infection, site not specified: Secondary | ICD-10-CM

## 2013-08-02 DIAGNOSIS — N1330 Unspecified hydronephrosis: Secondary | ICD-10-CM

## 2013-08-02 LAB — COMPREHENSIVE METABOLIC PANEL (CC13)
ALK PHOS: 75 U/L (ref 40–150)
ALT: 13 U/L (ref 0–55)
AST: 22 U/L (ref 5–34)
Albumin: 3.7 g/dL (ref 3.5–5.0)
Anion Gap: 12 mEq/L — ABNORMAL HIGH (ref 3–11)
BUN: 22.7 mg/dL (ref 7.0–26.0)
CO2: 28 mEq/L (ref 22–29)
CREATININE: 1.9 mg/dL — AB (ref 0.6–1.1)
Calcium: 13.5 mg/dL (ref 8.4–10.4)
Chloride: 100 mEq/L (ref 98–109)
Glucose: 121 mg/dl (ref 70–140)
Potassium: 4.3 mEq/L (ref 3.5–5.1)
Sodium: 140 mEq/L (ref 136–145)
Total Bilirubin: 0.42 mg/dL (ref 0.20–1.20)
Total Protein: 7.3 g/dL (ref 6.4–8.3)

## 2013-08-02 LAB — URINALYSIS, MICROSCOPIC - CHCC
Bilirubin (Urine): NEGATIVE
Glucose: NEGATIVE mg/dL
KETONES: NEGATIVE mg/dL
Nitrite: NEGATIVE
Protein: 100 mg/dL
Specific Gravity, Urine: 1.015 (ref 1.003–1.035)
Urobilinogen, UR: 0.2 mg/dL (ref 0.2–1)
pH: 6 (ref 4.6–8.0)

## 2013-08-02 LAB — CBC WITH DIFFERENTIAL/PLATELET
BASO%: 1.1 % (ref 0.0–2.0)
BASOS ABS: 0.1 10*3/uL (ref 0.0–0.1)
EOS ABS: 0.2 10*3/uL (ref 0.0–0.5)
EOS%: 3.6 % (ref 0.0–7.0)
HCT: 35.1 % (ref 34.8–46.6)
HEMOGLOBIN: 11.1 g/dL — AB (ref 11.6–15.9)
LYMPH%: 18.7 % (ref 14.0–49.7)
MCH: 28.1 pg (ref 25.1–34.0)
MCHC: 31.5 g/dL (ref 31.5–36.0)
MCV: 89.2 fL (ref 79.5–101.0)
MONO#: 0.8 10*3/uL (ref 0.1–0.9)
MONO%: 14.6 % — AB (ref 0.0–14.0)
NEUT%: 62 % (ref 38.4–76.8)
NEUTROS ABS: 3.5 10*3/uL (ref 1.5–6.5)
Platelets: 327 10*3/uL (ref 145–400)
RBC: 3.93 10*6/uL (ref 3.70–5.45)
RDW: 17.7 % — ABNORMAL HIGH (ref 11.2–14.5)
WBC: 5.7 10*3/uL (ref 3.9–10.3)
lymph#: 1.1 10*3/uL (ref 0.9–3.3)

## 2013-08-02 LAB — LACTATE DEHYDROGENASE (CC13): LDH: 393 U/L — ABNORMAL HIGH (ref 125–245)

## 2013-08-02 LAB — PROTIME-INR
INR: 1.6 — ABNORMAL LOW (ref 2.00–3.50)
PROTIME: 19.2 s — AB (ref 10.6–13.4)

## 2013-08-02 MED ORDER — LENALIDOMIDE 15 MG PO CAPS: 15.0000 mg | ORAL_CAPSULE | ORAL | Status: DC

## 2013-08-02 MED ORDER — SODIUM CHLORIDE 0.9 % IV SOLN
INTRAVENOUS | Status: DC
  Administered 2013-08-02: 13:00:00 via INTRAVENOUS

## 2013-08-02 MED ORDER — ZOLEDRONIC ACID 4 MG/100ML IV SOLN
4.0000 mg | Freq: Once | INTRAVENOUS | Status: DC
  Administered 2013-08-02: 4 mg via INTRAVENOUS
  Filled 2013-08-02: qty 100

## 2013-08-02 MED ORDER — SODIUM CHLORIDE 0.9 % IJ SOLN
10.0000 mL | INTRAMUSCULAR | Status: DC | PRN
  Administered 2013-08-02: 10 mL via INTRAVENOUS
  Filled 2013-08-02: qty 10

## 2013-08-02 MED ORDER — HEPARIN SOD (PORK) LOCK FLUSH 100 UNIT/ML IV SOLN
500.0000 [IU] | Freq: Once | INTRAVENOUS | Status: AC
Start: 2013-08-02 — End: 2013-08-02
  Administered 2013-08-02: 500 [IU] via INTRAVENOUS
  Filled 2013-08-02: qty 5

## 2013-08-02 NOTE — Progress Notes (Signed)
[place], [place] [address] [name] [address]  DIAGNOSIS: Non-Hodgkin's lymphoma of abdomen - Plan: lenalidomide (REVLIMID) 15 MG capsule, CBC with Differential, Comprehensive metabolic panel (Cmet) - [facility], Lactate dehydrogenase (LDH) - [facility], CBC with Differential, Basic metabolic panel (Bmet) - [facility]  Chief Complaint  Patient presents with  . Follow-up    CURRENT TREATMENT: None.  Allowing recovery from recent hospitalization.  We will start Revlimid plus rituxan as a 4th line salvage therapy on 08/09/2013 (Witzig TE, et al, Ann Oncol. 2011 Jul;22 (7):1622-7.      Non-Hodgkin's lymphoma of abdomen   09/23/2010 Imaging CT abdomen and pelvis: 5.5 x 6 cm irregular right abdominal mass with adjacent small lymph nodes likely representing neoplasm.  This does not appear to connect to hollow or solid viscera and may represent a sarcoma.    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    11/03/2010 Initial Diagnosis Non-Hodgkin's lymphoma of abdomen   11/24/2010 Bone Marrow Biopsy No involvment of lymphoma identified.    11/25/2010 Cancer Staging PET/CT: 1.  Interval enlargement of a hypermetabolic ileocolic mesenteric nodal mass. 2.  Hypermetabolic focus corresponding to an enlarging precaval node.  This is consistent with active lymphoma. 3.  No evidence of extra-abdominal disease.   12/08/2010 Procedure R port-a-catheter placment   12/11/2010 - 03/27/2011 Chemotherapy Q 3 week R-CHOP x 6 cyles   04/27/2011 Cancer Staging PET/CT:  1.  Complete response to chemotherapy with no hypermetabolic activity associated with the mesenteric mass or prevascular lymph nodes which are also reduced in size.   05/28/2011 - 06/26/2011 Radiation Therapy Consolidative XRT [name]). Right abdomen and right pelvis/ 3060  cGy in 17 fractions   10/12/2011 Imaging CT C/A/P: 1.  Interval increase in size of aortocaval lymph node measuring 12 mm compared to 6 mm on prior.  This is concerning for mild disease progression. 2.  Mesenteric mass in the ileocecal mesentery is decreased slightly in size.3.  Spleen is normal.   01/18/2012 Imaging CT C/A/P: 1.  Slight interval decrease in size of the mesenteric mass. 2.  Enlarging retroperitoneal lymph node. 3.  No new lymphadenopathy.   05/23/2012 Imaging CT of abdomen: 1.  Significant interval enlargement of a retroperitoneal LN  adjacent to the IVC and infrarenal abdominal aorta, which currently measures 3.9 x 2.9 cm.  Findings are compatible with progression of disease.  No new lymphadenopathy.    05/23/2012 Relapse/Recurrence She had relapsed disease.   06/29/2012 - 08/25/2012 Chemotherapy Completed 3 cycles of bendamustine/Rituxan on 06/29/2012.    08/25/2012 Progression Had disease progression while on Bendamustine/Rituxan   10/03/2012 Procedure Biopsy of R. Perotoneal mass   10/03/2012 Pathology Pathology consistent with high grade NH B-cell lymphoma   10/26/2012 - 03/14/2013 Chemotherapy She had salvage chemotherapy with R-GPD q 3 weeks.  Completed 5 cyles.    02/11/2013 - 02/24/2013 Hospital Admission Admitted secondary to febrile neutropenia.  Her infectious work-up was negative and her thrombocytopenia was prolonged.  She received antibiotics and plts transfusion.    03/27/2013 Imaging CT C/A/P on 12/15 was consistent with mild disease progression.    06/05/2013 Progression CT C/A/P consistent with moderate disease progression plus R hydronephosis (moderate,severe).  Planning R-ICE salvage chemotherapy followed by transplant [name], Dr. Cassell Clement). Referral to Urology   06/26/2013 - 07/03/2013 Hospital Admission Hospitalized with AMS, fevers and  dysuria.  Treated for urosepsis with broad spectrum antibiotics.  Perc nephrostomy placed on right kidney due to hydronephrosis.  Discharged to  rehab.    07/14/2013 Procedure Had Perc nephrostomy removed with placement of right ureteral stent.     08/02/2013 -  Chemotherapy Starting rituximab (first dose on 08/09/2013) and lenalidomide 15 mg every other day. (Witzig, TE et. al, Ann Oncol. 2011 Jul;22(7): 1622-7.     INTERVAL HISTORY: [name] 73 y.o. female returns for follow up visit. She was last seen by me on 07/19/2013.  Today, she is accompanied by her niece [name].  She has discharged from the  [facility]. As noted above she had a prolonged hospitalization secondary to AMS, renal failure and probable urosepsis.  She had right percutaneous nephrostomy tube placed and this was exchanged successfully on 07/14/2013.  She reports continued weakness but good urinary output.    She denies headaches or visual changes. She is a former longstanding smoker who quit over the past year. She denied headaches, double vision, blurry vision, nasal discharge, hearing problems, odynophagia or dysphagia. No chest pain, palpitations, dyspnea, cough, abdominal pain, vomiting, diarrhea, constipation, hematochezia. The patient denied dysuria, nocturia, polyuria, hematuria, myalgia, numbness, tingling, psychiatric problems. She denies any further bleeding episodes including epitaxis. She denies any fevers or chills. She reports a good appetite.   MEDICAL HISTORY: Past Medical History  Diagnosis Date  . Sick sinus syndrome   . Hematoma     At the site of the pacemaker insertion.  . Hypotension   . Dyslipidemia   . History of atrial fibrillation   . Arthritis     Osteoarthritis  . Port-a-cath in place 12/08/10  . Status post chemotherapy completed 03/2011    R - CHOP q 3 weeks x 6  . S/P radiation therapy 05/28/2011 - 06/26/2011    Right Abdomen and Right Pelvis/3060 cGy in 17 Fractions  . Hypertension   . ASHD (arteriosclerotic heart disease)   . Gout   . COPD (chronic obstructive pulmonary disease)   . Asthma   . Hyperlipidemia   .  Diabetes mellitus   . Hypothyroidism   . Diffuse large B cell lymphoma     Dr. Chisom,oncology  . Cancer 10/2010    large B cell lymphoma  . 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 Pacemaker     left chest  . Dysrhythmia     Hx. A. Fib, sick sinus syndrome- Pacemaker inserted.  . Cataracts, bilateral     INTERIM HISTORY: has Essential hypertension, benign; Cardiac pacemaker in situ; Diffuse large B cell lymphoma; Hypothyroidism; Diabetes mellitus type 2 in obese; Hyperlipidemia; Non-Hodgkin's lymphoma of abdomen; Depression; Fever and neutropenia; Antineoplastic chemotherapy induced pancytopenia; Acute renal failure; Anemia of chronic disease; Chronic bronchitis; ASHD (arteriosclerotic heart disease); Gout; COPD (chronic obstructive pulmonary disease); Asthma; Atrial fibrillation with RVR; Hydronephrosis, right; UTI (lower urinary tract infection); Severe sepsis; Sepsis secondary to UTI; Constipation; Anticoagulation management encounter; and Generalized weakness on her problem list.    ALLERGIES:  is allergic to codeine and penicillins.  MEDICATIONS: has a current medication list which includes the following prescription(s): albuterol, allopurinol, atorvastatin, vitamin d3, citalopram, diltiazem, glycerin (pediatric), guaifenesin, levothyroxine, lidocaine-prilocaine, magnesium oxide, ondansetron, polyethylene glycol, sennosides-docusate sodium, tiotropium, urelle, warfarin, and lenalidomide, and the following Facility-Administered Medications: sodium chloride.  SURGICAL HISTORY:  Past Surgical History  Procedure Laterality Date  . Pacemaker insertion      Lead revision completed August 04, 2006-  left chest G. Lovena Le  . Tubal ligation      Bilateral  . Insert / replace / remove pacemaker    . Abdominal hysterectomy    . Biopsy stomach  11/03/10    Soft Tissue Mass, Biopsy, Right Lower Quadrant Mesenteric Mass- High Grade Non-Hodgkins B Cell Lymphoma   with Flow Cytometry  . Bone biopsy  11/24/10    Bone Marrow, Aspirate Biospy, and clot, Left - No Involvement of Non-Hodgkin's Lymphoma Identified  . Portacath placement Right 06-23-13  . Cholecystectomy      laparoscopic  . Bladder suspension      REVIEW OF SYSTEMS:   Constitutional: Denies fevers, chills or abnormal weight loss Eyes: Denies blurriness of vision Ears, nose, mouth, throat, and face: Denies mucositis or sore throat Respiratory: Reports a chronic non-productive cough, but denies dyspnea or wheezes Cardiovascular: Denies palpitation, chest discomfort or lower extremity swelling Gastrointestinal:  Denies nausea, heartburn or change in bowel habits Skin: Denies abnormal skin rashes Lymphatics: Denies new lymphadenopathy or easy bruising Neurological:Denies numbness, tingling or new weaknesses Behavioral/Psych: Mood is stable, no new changes  All other systems were reviewed with the patient and are negative.  PHYSICAL EXAMINATION: ECOG PERFORMANCE STATUS: 1 - Symptomatic but completely ambulatory  Blood pressure 124/61, pulse 80, temperature 98 F (36.7 C), temperature source Oral, resp. rate 18, height 4' 9"  (1.448 m), weight 208 lb 9.6 oz (94.62 kg).  GENERAL:alert, no distress and comfortable; Obese lady seating in her wheelchair, chronically ill appearing. SKIN: skin color, texture, turgor are normal, no rashes or significant lesions; Port a cath on R.  EYES: normal, Conjunctiva are pink and non-injected, sclera clear OROPHARYNX:no exudate, no erythema and lips, buccal mucosa, and tongue normal  NECK: supple, thyroid normal size, non-tender, without nodularity LYMPH:  no palpable lymphadenopathy in the cervical, axillary or supraclavicular LUNGS: clear to auscultation with normal breathing effort, no wheezes or rhonchi HEART: regular rate & rhythm and no murmurs and no lower extremity edema ABDOMEN:abdomen soft, non-tender and normal bowel sounds Musculoskeletal:no  cyanosis of digits and no clubbing  NEURO: alert & oriented x 3 with fluent speech, no focal motor/sensory deficits  Labs:  Lab Results  Component Value Date   WBC 5.7 08/02/2013   HGB 11.1* 08/02/2013   HCT 35.1 08/02/2013   MCV 89.2 08/02/2013   PLT 327 08/02/2013   NEUTROABS 3.5 08/02/2013      Chemistry      Component Value Date/Time   NA 140 08/02/2013 1042   NA 139 07/14/2013 1000   NA 141 10/12/2011 0957   K 4.3 08/02/2013 1042   K 4.3 07/14/2013 1000   K 4.4 10/12/2011 0957   CL 96 07/14/2013 1000   CL 102 09/21/2012 0917   CL 95* 10/12/2011 0957   CO2 28 08/02/2013 1042   CO2 31 07/14/2013 1000   CO2 29 10/12/2011 0957   BUN 22.7 08/02/2013 1042   BUN 22 07/14/2013 1000   BUN 15 10/12/2011 0957   CREATININE 1.9* 08/02/2013 1042   CREATININE 1.76* 07/14/2013 1000   CREATININE 1.10 04/19/2013 1420      Component Value Date/Time   CALCIUM 13.5* 08/02/2013 1042   CALCIUM 11.7* 07/14/2013 1000   CALCIUM 9.1 10/12/2011 0957   ALKPHOS 75 08/02/2013 1042   ALKPHOS 54 06/26/2013 1635   ALKPHOS 65 10/12/2011 0957   AST 22 08/02/2013 1042   AST 17 06/26/2013 1635   AST 19 10/12/2011 0957   ALT 13 08/02/2013 1042  ALT 11 06/26/2013 1635   ALT 20 10/12/2011 0957   BILITOT 0.42 08/02/2013 1042   BILITOT 0.4 06/26/2013 1635   BILITOT 0.60 10/12/2011 0957       Basic Metabolic Panel:  Recent Labs Lab 08/02/13 1042  NA 140  K 4.3  CO2 28  GLUCOSE 121  BUN 22.7  CREATININE 1.9*  CALCIUM 13.5*   GFR Estimated Creatinine Clearance: 25.8 ml/min (by C-G formula based on Cr of 1.9). Liver Function Tests:  Recent Labs Lab 08/02/13 1042  AST 22  ALT 13  ALKPHOS 75  BILITOT 0.42  PROT 7.3  ALBUMIN 3.7    CBC:  Recent Labs Lab 08/02/13 1042  WBC 5.7  NEUTROABS 3.5  HGB 11.1*  HCT 35.1  MCV 89.2  PLT 327    Studies:  No results found.   RADIOGRAPHIC STUDIES: None  ASSESSMENT: Westley Gambles [place] 73 y.o. female with a history of Non-Hodgkin's lymphoma of abdomen - Plan: lenalidomide  (REVLIMID) 15 MG capsule, CBC with Differential, Comprehensive metabolic panel (Cmet) - [facility], Lactate dehydrogenase (LDH) - [facility], CBC with Differential, Basic metabolic panel (Bmet) - [facility]   PLAN:   1. Relapsed high grade non-hodgkin's B cell Lymphoma on salvage therapy, progressed on salvage R-GDP  -She completed 5 cycles of R+DGP q 21 days (modified doses based on dehydration, electrolyte abnormalities) and we reviewed the CT Scans of C/A/P (2/23) which was consistent with a further moderate increase in retroperitoneal lymphadenopathy complicated by right hydronephrosis. She now has a right ureteral stent in place.  Previously we considered SCT but due to progressive decline as evident with multiple hospitalizations, we will no longer pursue SCT.  The patient was in aggreement with this plan.  We will try a less aggressive 4-th line therapy with Rituxan plus revlimid based on Witzig, TE, et. Adrian Prince Oncol. 2011; Jul;22(7):1622-7.  Of the 108 patients with DLBCL 28% or 30 patients had an overall response rate.  Most common adverse events was myelosuppression with grade 4 neutropenia and thrombocytopenia in 17% and 6%, respectively.  They were given lenalidomide 25 mg on days 1-21 every 28 days as tolerated or until progression.  Given her renal insufficiency, we will start at 15 mg every other day.  In addition, we will give rituximab 375 mg/m2 every 28 days starting on 08/09/2013.  Patient understood the indications, benefits and risks associated with the treatment and consented to treatment.    We hope to start this therapy once her creatinine is stable and she is recovered from her last prolonged hospitalization.    2. R. Hydronephrosis, s/p R ureteral stent (04/03) --Patient had R ureteral stent placed on 04/03.  Creatinine is stable.  We will repeat her BMP in one week and if still going up, she will need stent exchange possibly.   3. Hypercalcemia.  --Given one liter of NS and zometa x 1. Repeat  chemistries in 1 week.  4. Anemia, mild  -- She is asymptomatic for anemia.   5. Elevated creatinine secondary #2 (in part).  --Creatinine is 1.9 today (Range 1.1 to 1.6 over the past one year). We encouraged aggressive hydration and avoidance of nephrotoxins. We will continue to closely monitor her labs weekly.  6. Hypothyroidism: Levothyroxine per PCP  7. Hypertension: Diltiazem per PCP.  8. Diabetes mellitus, type II: Metformin per PCP.  9. Hyperlipidemia: Pravastatin per PCP.  10.History of atrial fibrillation: Diltiazem, pacemaker. Coumadin being managed per her PCP previously.   11. COPD/ Chronic Bronchitis  exacerbation.  --Patient advised to continue albuterol q 6 hours as needed.  12. Urinary symptoms.   --We will check a UA and urine cultures.  If symptomatic and positive, we will treat for UTI.  She may have some bacteria colonization with the stent.  13. Follow-up in 3 weeks for a symptom visit.  She will also have labs in one week and her first rituximab.   We will obtain CBC, CMP.  All questions were answered. The patient knows to call the clinic with any problems, questions or concerns. We can certainly see the patient much sooner if necessary.  I spent 25 minutes counseling the patient face to face. The total time spent in the appointment was 40 minutes.    Concha Norway, MD 08/02/2013 5:51 PM

## 2013-08-02 NOTE — Patient Instructions (Addendum)
1. Starting Rituximab once per month on 4/29 (next Wednesday) 2. Starting lenalidomide 15 mg every other day (corrected for creatinine clearance). 3. Rescan in 2 months. 4. Follow up in 3 weeks for labs.     Lenalidomide Oral Capsules What is this medicine? LENALIDOMIDE (len a LID oh mide) is used to treat certain types of cancer, including multiple myeloma and mantle cell lymphoma. It is also used to treat some myelodysplastic syndromes that cause severe anemia requiring blood transfusions. This medicine may be used for other purposes; ask your health care provider or pharmacist if you have questions. COMMON BRAND NAME(S): Revlimid What should I tell my health care provider before I take this medicine? They need to know if you have any of these conditions: -blood clots in the legs or the lungs -infection -irregular monthly periods or menstrual cycles -kidney disease -liver disease -an unusual or allergic reaction to lenalidomide, other medicines, foods, dyes, or preservatives -pregnant or trying to get pregnant -breast-feeding How should I use this medicine? Take this medicine by mouth with a glass of water. Follow the directions on the prescription label. Do not cut, crush, or chew this medicine. Take your medicine at regular intervals. Do not take it more often than directed. Do not stop taking except on your doctor's advice. A MedGuide will be given with each prescription and refill. Read this guide carefully each time. The MedGuide may change frequently. Talk to your pediatrician regarding the use of this medicine in children. Special care may be needed. Overdosage: If you think you have taken too much of this medicine contact a poison control center or emergency room at once. NOTE: This medicine is only for you. Do not share this medicine with others. What if I miss a dose? If you miss a dose, take it as soon as you can. If your next dose is to be taken in less than 12 hours, then  do not take the missed dose. Take the next dose at your regular time. Do not take double or extra doses. What may interact with this medicine? -vaccines This list may not describe all possible interactions. Give your health care provider a list of all the medicines, herbs, non-prescription drugs, or dietary supplements you use. Also tell them if you smoke, drink alcohol, or use illegal drugs. Some items may interact with your medicine. What should I watch for while using this medicine? Visit your doctor for regular check ups. Tell your doctor or healthcare professional if your symptoms do not start to get better or if they get worse. You will need to have important blood work done while you are taking this medicine. This medicine is available only through a special program. Doctors, pharmacies, and patients must meet all of the conditions of the program. Your health care provider will help you get signed up with the program if you need this medicine. Through the program you will only receive up to a 28 day supply of the medicine at one time. You will need a new prescription for each refill. This medicine can cause birth defects. Do not get pregnant while taking this drug. Females with child-bearing potential will need to have 2 negative pregnancy tests before starting this medicine. Pregnancy testing must be done every 2 to 4 weeks as directed while taking this medicine. Use 2 reliable forms of birth control together while you are taking this medicine and for 1 month after you stop taking this medicine. If you think that you might be  pregnant talk to your doctor right away. Men must use a latex condom during sexual contact with a woman while taking this medicine and for 28 days after you stop taking this medicine. A latex condom is needed even if you have had a vasectomy. Contact your doctor right away if your partner becomes pregnant. Do not donate sperm while taking this medicine and for 28 days after you  stop taking this medicine. Do not give blood while taking the medicine and for 1 month after completion of treatment to avoid exposing pregnant women to the medicine through the donated blood. Talk to your doctor about your risk of cancer. You may be more at risk for certain types of cancers if you take this medicine. You may need blood work done while you are taking this medicine. What side effects may I notice from receiving this medicine? Side effects that you should report to your doctor or health care professional as soon as possible: -allergic reactions like skin rash, itching or hives, swelling of the face, lips, or tongue -breathing problems -chest pain -fever, infection, runny nose, or sore throat -pain in the legs -right upper belly pain -signs and symptoms of bleeding such as bloody or black, tarry stools; red or dark-brown urine; spitting up blood or brown material that looks like coffee grounds; red spots on the skin; unusual bruising or bleeding from the eye, gums, or nose -swelling or your hands, ankles, or leg -tiredness -yellowing of the eyes or skin  Side effects that usually do not require medical attention (report to your doctor or health care professional if they continue or are bothersome): -diarrhea -dizziness -back pain This list may not describe all possible side effects. Call your doctor for medical advice about side effects. You may report side effects to FDA at 1-800-FDA-1088. Where should I keep my medicine? Keep out of the reach of children. Store at room temperature between 15 and 30 degrees C (59 and 86 degrees F). Throw away any unused medicine after the expiration date. NOTE: This sheet is a summary. It may not cover all possible information. If you have questions about this medicine, talk to your doctor, pharmacist, or health care provider.  2014, Elsevier/Gold Standard. (2012-08-16 16:37:01) Rituximab injection What is this medicine? RITUXIMAB (ri TUX  i mab) is a monoclonal antibody. This medicine changes the way the body's immune system works. It is used commonly to treat non-Hodgkin's lymphoma and other conditions. In cancer cells, this drug targets a specific protein within cancer cells and stops the cancer cells from growing. It is also used to treat rhuematoid arthritis (RA). In RA, this medicine slow the inflammatory process and help reduce joint pain and swelling. This medicine is often used with other cancer or arthritis medications. This medicine may be used for other purposes; ask your health care provider or pharmacist if you have questions. COMMON BRAND NAME(S): Rituxan What should I tell my health care provider before I take this medicine? They need to know if you have any of these conditions: -blood disorders -heart disease -history of hepatitis B -infection (especially a virus infection such as chickenpox, cold sores, or herpes) -irregular heartbeat -kidney disease -lung or breathing disease, like asthma -lupus -an unusual or allergic reaction to rituximab, mouse proteins, other medicines, foods, dyes, or preservatives -pregnant or trying to get pregnant -breast-feeding How should I use this medicine? This medicine is for infusion into a vein. It is administered in a hospital or clinic by a specially trained  health care professional. A special MedGuide will be given to you by the pharmacist with each prescription and refill. Be sure to read this information carefully each time. Talk to your pediatrician regarding the use of this medicine in children. This medicine is not approved for use in children. Overdosage: If you think you have taken too much of this medicine contact a poison control center or emergency room at once. NOTE: This medicine is only for you. Do not share this medicine with others. What if I miss a dose? It is important not to miss a dose. Call your doctor or health care professional if you are unable to keep  an appointment. What may interact with this medicine? -cisplatin -medicines for blood pressure -some other medicines for arthritis -vaccines This list may not describe all possible interactions. Give your health care provider a list of all the medicines, herbs, non-prescription drugs, or dietary supplements you use. Also tell them if you smoke, drink alcohol, or use illegal drugs. Some items may interact with your medicine. What should I watch for while using this medicine? Report any side effects that you notice during your treatment right away, such as changes in your breathing, fever, chills, dizziness or lightheadedness. These effects are more common with the first dose. Visit your prescriber or health care professional for checks on your progress. You will need to have regular blood work. Report any other side effects. The side effects of this medicine can continue after you finish your treatment. Continue your course of treatment even though you feel ill unless your doctor tells you to stop. Call your doctor or health care professional for advice if you get a fever, chills or sore throat, or other symptoms of a cold or flu. Do not treat yourself. This drug decreases your body's ability to fight infections. Try to avoid being around people who are sick. This medicine may increase your risk to bruise or bleed. Call your doctor or health care professional if you notice any unusual bleeding. Be careful brushing and flossing your teeth or using a toothpick because you may get an infection or bleed more easily. If you have any dental work done, tell your dentist you are receiving this medicine. Avoid taking products that contain aspirin, acetaminophen, ibuprofen, naproxen, or ketoprofen unless instructed by your doctor. These medicines may hide a fever. Do not become pregnant while taking this medicine. Women should inform their doctor if they wish to become pregnant or think they might be pregnant.  There is a potential for serious side effects to an unborn child. Talk to your health care professional or pharmacist for more information. Do not breast-feed an infant while taking this medicine. What side effects may I notice from receiving this medicine? Side effects that you should report to your doctor or health care professional as soon as possible: -allergic reactions like skin rash, itching or hives, swelling of the face, lips, or tongue -low blood counts - this medicine may decrease the number of white blood cells, red blood cells and platelets. You may be at increased risk for infections and bleeding. -signs of infection - fever or chills, cough, sore throat, pain or difficulty passing urine -signs of decreased platelets or bleeding - bruising, pinpoint red spots on the skin, black, tarry stools, blood in the urine -signs of decreased red blood cells - unusually weak or tired, fainting spells, lightheadedness -breathing problems -confused, not responsive -chest pain -fast, irregular heartbeat -feeling faint or lightheaded, falls -mouth sores -redness,  blistering, peeling or loosening of the skin, including inside the mouth -stomach pain -swelling of the ankles, feet, or hands -trouble passing urine or change in the amount of urine Side effects that usually do not require medical attention (report to your doctor or other health care professional if they continue or are bothersome): -anxiety -headache -loss of appetite -muscle aches -nausea -night sweats This list may not describe all possible side effects. Call your doctor for medical advice about side effects. You may report side effects to FDA at 1-800-FDA-1088. Where should I keep my medicine? This drug is given in a hospital or clinic and will not be stored at home. NOTE: This sheet is a summary. It may not cover all possible information. If you have questions about this medicine, talk to your doctor, pharmacist, or health  care provider.  2014, Elsevier/Gold Standard. (2007-11-28 14:04:59)

## 2013-08-02 NOTE — Progress Notes (Signed)
HR noted to be 47; Orthostatics completed and Dr [name] aware. EKG noted vent rate of 92 with ventricular pacemaker. Dr Julien Nordmann reviewed EKG's. Patient denies any dizziness or lightheadedness. Patient alert and oriented. Patient discharged with transportation staff.

## 2013-08-02 NOTE — Telephone Encounter (Signed)
[name] has called me back, we moved hios appt to 7/2

## 2013-08-02 NOTE — Telephone Encounter (Signed)
Per staff message and POF I have scheduled appts.  JMW  

## 2013-08-02 NOTE — Patient Instructions (Signed)
Zoledronic Acid injection (Hypercalcemia, Oncology) What is this medicine? ZOLEDRONIC ACID (ZOE le dron ik AS id) lowers the amount of calcium loss from bone. It is used to treat too much calcium in your blood from cancer. It is also used to prevent complications of cancer that has spread to the bone. This medicine may be used for other purposes; ask your health care provider or pharmacist if you have questions. COMMON BRAND NAME(S): Zometa What should I tell my health care provider before I take this medicine? They need to know if you have any of these conditions: -aspirin-sensitive asthma -cancer, especially if you are receiving medicines used to treat cancer -dental disease or wear dentures -infection -kidney disease -receiving corticosteroids like dexamethasone or prednisone -an unusual or allergic reaction to zoledronic acid, other medicines, foods, dyes, or preservatives -pregnant or trying to get pregnant -breast-feeding How should I use this medicine? This medicine is for infusion into a vein. It is given by a health care professional in a hospital or clinic setting. Talk to your pediatrician regarding the use of this medicine in children. Special care may be needed. Overdosage: If you think you have taken too much of this medicine contact a poison control center or emergency room at once. NOTE: This medicine is only for you. Do not share this medicine with others. What if I miss a dose? It is important not to miss your dose. Call your doctor or health care professional if you are unable to keep an appointment. What may interact with this medicine? -certain antibiotics given by injection -NSAIDs, medicines for pain and inflammation, like ibuprofen or naproxen -some diuretics like bumetanide, furosemide -teriparatide -thalidomide This list may not describe all possible interactions. Give your health care provider a list of all the medicines, herbs, non-prescription drugs, or  dietary supplements you use. Also tell them if you smoke, drink alcohol, or use illegal drugs. Some items may interact with your medicine. What should I watch for while using this medicine? Visit your doctor or health care professional for regular checkups. It may be some time before you see the benefit from this medicine. Do not stop taking your medicine unless your doctor tells you to. Your doctor may order blood tests or other tests to see how you are doing. Women should inform their doctor if they wish to become pregnant or think they might be pregnant. There is a potential for serious side effects to an unborn child. Talk to your health care professional or pharmacist for more information. You should make sure that you get enough calcium and vitamin D while you are taking this medicine. Discuss the foods you eat and the vitamins you take with your health care professional. Some people who take this medicine have severe bone, joint, and/or muscle pain. This medicine may also increase your risk for jaw problems or a broken thigh bone. Tell your doctor right away if you have severe pain in your jaw, bones, joints, or muscles. Tell your doctor if you have any pain that does not go away or that gets worse. Tell your dentist and dental surgeon that you are taking this medicine. You should not have major dental surgery while on this medicine. See your dentist to have a dental exam and fix any dental problems before starting this medicine. Take good care of your teeth while on this medicine. Make sure you see your dentist for regular follow-up appointments. What side effects may I notice from receiving this medicine? Side effects that   you should report to your doctor or health care professional as soon as possible: -allergic reactions like skin rash, itching or hives, swelling of the face, lips, or tongue -anxiety, confusion, or depression -breathing problems -changes in vision -eye pain -feeling faint or  lightheaded, falls -jaw pain, especially after dental work -mouth sores -muscle cramps, stiffness, or weakness -trouble passing urine or change in the amount of urine Side effects that usually do not require medical attention (report to your doctor or health care professional if they continue or are bothersome): -bone, joint, or muscle pain -constipation -diarrhea -fever -hair loss -irritation at site where injected -loss of appetite -nausea, vomiting -stomach upset -trouble sleeping -trouble swallowing -weak or tired This list may not describe all possible side effects. Call your doctor for medical advice about side effects. You may report side effects to FDA at 1-800-FDA-1088. Where should I keep my medicine? This drug is given in a hospital or clinic and will not be stored at home. NOTE: This sheet is a summary. It may not cover all possible information. If you have questions about this medicine, talk to your doctor, pharmacist, or health care provider.  2014, Elsevier/Gold Standard. (2012-09-08 13:03:13)  Hypercalcemia Hypercalcemia means the calcium in your blood is too high. Calcium in our blood is important for the control of many things, such as:  Blood clotting.  Conducting of nerve impulses.  Muscle contraction.  Maintaining teeth and bone health.  Other body functions. In the bloodstream, calcium maintains a constant balance with another mineral, phosphate. Calcium is absorbed into the body through the small intestine. This is helped by Vitamin D. Calcium levels are maintained mostly by vitamin D and a hormone (parathyroid hormone). But the kidneys also help. Hypercalcemia can happen when the concentration of calcium is too high for the kidneys to maintain balance. The body maintains a balance between the calcium we eat and the calcium already in our body. If calcium intake is increased or we cannot use calcium properly, there may be problems. Some common sources of  calcium are:   Dairy products.  Nuts.  Eggs.  Whole grains.  Legumes.  Green leafy vegetables. CAUSES There are many causes of this condition, but some common ones are:  Hyperparathyroidism. This is an over activity of the parathyroid gland.  Cancers of the breast, kidney, lung, head and neck are common causes of calcium increases.  Medications that cause you to urinate more often (diuretics), nausea, vomiting and diarrhea also increase the calcium in the blood.  Overuse of calcium-containing antacids. SYMPTOMS  Many patients with mild hypercalcemia have no symptoms. For those with symptoms common problems include:  Loss of appetite.  Constipation.  Increased thirst.  Heart rhythm changes.  Abnormal thinking.  Nausea.  Abdominal pain.  Kidney stones.  Mood swings.  Coma and death when severe.  Vomiting.  Increased urination.  High blood pressure.  Confusion. DIAGNOSIS   Your caregiver will do a medical history and perform a physical exam on you.  Calcium and parathyroid hormone (PTH) may be measured with a blood test. TREATMENT   The treatment depends on the calcium level and what is causing the higher level. Hypercalcemia can be lifethreatening. Fast lowering of the calcium level may be necessary.  With normal kidney function, fluids can be given by vein to clear the excess calcium. Hemodialysis works well to reduce dangerous calcium levels if there is poor kidney function. This is a procedure in which a machine is used to filter  out unwanted substances. The blood is then returned to the body.  Drugs, such as diuretics, can be given after adequate fluid intake is established. These medications help the kidneys get rid of extra calcium. Drugs that lessen (inhibit) bone loss are helpful in gaining long-term control. Phosphate pills help lower high calcium levels caused by a low supply of phosphate. Anti-inflammatory agents such as steroids are helpful  with some cancers and toxic levels of vitamin D.  Treatment of the underlying cause of the hypercalcemia will also correct the imbalance. Hyperparathyroidism is usually treated by surgical removal of one or more of the parathyroid glands and any tissue, other than the glands themselves, that is producing too much hormone.  The hypercalcemia caused by cancer is difficult to treat without controlling the cancer. Symptoms can be improved with fluids and drug therapy as outlined above. PROGNOSIS   Surgery to remove the parathyroid glands is usually successful. This also depends on the amount of damage to the kidneys and whether or not it can be treated.  Mild hypercalcemia can be controlled with good fluid intake and the use of effective medications.  Hypercalcemia often develops as a late complication of cancer. The expected outlook is poor without effective anticancer therapy. PREVENTION   If you are at risk for developing hypercalcemia, be familiar with early symptoms. Report these to your caregiver.  Good fluid intake (up to four quarts of liquid a day if possible) is helpful.  Try to control nausea and vomiting, and treat fevers to avoid dehydration.  Lowering the amount of calcium in your diet is not necessary. High blood calcium reduces absorption of calcium in the intestine.  Stay as active as possible. SEEK IMMEDIATE MEDICAL CARE IF:   You develop chest pain, sweating, or shortness of breath.  You get confused, feel faint or pass out.  You develop severe nausea and vomiting. MAKE SURE YOU:   Understand these instructions.  Will watch your condition.  Will get help right away if you are not doing well or get worse. Document Released: 06/13/2004 Document Revised: 07/25/2012 Document Reviewed: 03/25/2010 [facility] Patient Information 2014 [place], [place].

## 2013-08-02 NOTE — Telephone Encounter (Signed)
Gave pt appt for lab,md and chemo for April and MAy 2015 °

## 2013-08-03 ENCOUNTER — Encounter: Payer: Self-pay | Admitting: Internal Medicine

## 2013-08-03 ENCOUNTER — Telehealth: Payer: Self-pay

## 2013-08-03 DIAGNOSIS — N390 Urinary tract infection, site not specified: Secondary | ICD-10-CM

## 2013-08-03 DIAGNOSIS — N1330 Unspecified hydronephrosis: Secondary | ICD-10-CM

## 2013-08-03 DIAGNOSIS — C8330 Diffuse large B-cell lymphoma, unspecified site: Secondary | ICD-10-CM

## 2013-08-03 NOTE — Telephone Encounter (Signed)
S/w pt to see how the evening went. She had no sx of bradycardia. She slept well. She was incontinent urine in the night. She felt burning at urethra this AM but states a little less. Discussed probable antibiotic to prevent UTI, UA yesterday had many bacteria. Order for urine culture placed per Dr [name].

## 2013-08-03 NOTE — Progress Notes (Signed)
Faxed revlimid prescription to [facility] °

## 2013-08-03 NOTE — Telephone Encounter (Signed)
[facility] called asking for pt information to do prior authorization for revlimed. Information faxed to  [phone]

## 2013-08-04 ENCOUNTER — Telehealth: Payer: Self-pay

## 2013-08-04 NOTE — Progress Notes (Addendum)
DENIAL FROM [employer] [place] FOR DIAGNOSIS.  FAXED EVERYTHING TO DEBBY KARL @ [place] [phone] AND PHONE # [phone] EXT [phone].  08/08/13 COMPLETED THE APPEAL FORM AND WILL HAVE Audryna SIGN IT TOMORROW WHEN SHE COMES IN.  08/09/13 Remmy SIGNED THE PAPERS AND I FAXED 53 PAGES TO THEM ATTN:  DEBBY KARL @ [place]  [phone] AND HER PHONE # IS [phone]  EXT [phone].  08/10/13 ANTHONY GIULIANI SENT A REQUEST FOR A LETTER OF MEDICAL NECESSITY TO BE COMPLETED.  08/14/13 CALLED [employer] AND SPOKE TO ALICE E. AND SHE TOOK THE INFORMATION.  [facility] CALLED AND REQUESTED I FAX CLINICALS TO [phone].  08/15/13 [facility] CALLED [facility] AND SAID THERE WERE DISCREPANCIES IN WHAT WE SENT IN AND HER ANSWERS.  CALLED [facility] KARL AND IT WAS DUE TO WE WERE NOT SURE IF SHE HAD A UTERUS AND SHE SAID SHE DIDN'T KNOW IT COULD CAUSE BIRTH DEFECTS IN UNBORN CHILDREN.  I WENT TO E-CHART AND SHE DID HAVE HER UTERUS REMOVED 09/21/2003.    08/16/13 GAVE A COPY OF THE OPERATIVE REPORT TO ROBIN, RN TO HAVE SCANNED.   SHERITA VALENTINE CALLED FROM [employer] AND THEY HAVE APPROVED THE MEDICATION AFTER THE APPEAL.  IT IS VALID FROM 08/03/13 TO 08/04/14.  SHE WILL FAX ME A LETTER AS WELL.  LETTER RECEIVED.  WILL SCAN. CALLED [employer] AND WILL FAX THEM A COPY.

## 2013-08-04 NOTE — Telephone Encounter (Signed)
Received call from [place] at [facility] that pt does not meet approval for Revlimid. She stated that non hodgkins lymphoma is not an approved diagnosis for this medication.Lenoria Farrier from [employer] also called about this denial asking if Dr [name] is going to appeal this decision. This information forwarded to Dr [name].

## 2013-08-05 LAB — URINE CULTURE

## 2013-08-07 ENCOUNTER — Other Ambulatory Visit: Payer: Self-pay | Admitting: Medical Oncology

## 2013-08-07 ENCOUNTER — Telehealth: Payer: Self-pay | Admitting: Pharmacist

## 2013-08-07 ENCOUNTER — Other Ambulatory Visit: Payer: Self-pay | Admitting: Internal Medicine

## 2013-08-07 DIAGNOSIS — C8593 Non-Hodgkin lymphoma, unspecified, intra-abdominal lymph nodes: Secondary | ICD-10-CM

## 2013-08-07 DIAGNOSIS — I4891 Unspecified atrial fibrillation: Secondary | ICD-10-CM

## 2013-08-07 NOTE — Telephone Encounter (Signed)
New coumadin clinic patient referral received from Dr. [name] for [name]. Patient is currently on coumadin for atrial fibrillation but will be treated for Non-Hodgkin's lymphoma at [facility] and will now have coumadin managed by our coumadin clinic. Pt previously managed by PCP Ms. Urquilla was on 5mg  daily with 2.5 mg on tuesday and thursdays. She cannot remember how long she has been on coumadin.  INR low via [place] home health RN (1.6). So I instructed Ms. Strupp over the phone to increased to 5 mg daily on 08/07/13 and we will see her for the first time on Wednesday 08/09/13 in the infusion area and will recheck her INR on this date.  Thank you,  Montel Clock, PharmD

## 2013-08-08 ENCOUNTER — Encounter: Payer: Self-pay | Admitting: Medical Oncology

## 2013-08-08 ENCOUNTER — Telehealth: Payer: Self-pay | Admitting: Medical Oncology

## 2013-08-08 NOTE — Telephone Encounter (Signed)
Mickel Baas- nurse with [facility] called asking if pt will need another PT/INR drawn. I explained that she will beginning chemo tomorrow and we are going to check her PT/INR. I did inform her that the pharmacist increased he dose to 5 mg daily as of yesterday. Once pt is seen tomorrow in the coumadin clinic we will call her if we need labs drawn. She also wanted to review the patient's medication list to make sure they had an updated list.

## 2013-08-08 NOTE — Progress Notes (Signed)
[employer] called asking about update on Revlimid. I left her a message that Dannielle Huh in our office is working with insurance. We should have an answer within 72 hours and I will contact her.

## 2013-08-09 ENCOUNTER — Other Ambulatory Visit: Payer: Self-pay | Admitting: Internal Medicine

## 2013-08-09 ENCOUNTER — Telehealth: Payer: Self-pay | Admitting: Internal Medicine

## 2013-08-09 ENCOUNTER — Ambulatory Visit (HOSPITAL_BASED_OUTPATIENT_CLINIC_OR_DEPARTMENT_OTHER): Payer: Medicare Other | Admitting: Pharmacist

## 2013-08-09 ENCOUNTER — Other Ambulatory Visit (HOSPITAL_BASED_OUTPATIENT_CLINIC_OR_DEPARTMENT_OTHER): Payer: Medicare Other

## 2013-08-09 ENCOUNTER — Ambulatory Visit (HOSPITAL_BASED_OUTPATIENT_CLINIC_OR_DEPARTMENT_OTHER): Payer: Medicare Other

## 2013-08-09 VITALS — BP 99/56 | HR 69 | Temp 97.2°F | Resp 16

## 2013-08-09 DIAGNOSIS — C8330 Diffuse large B-cell lymphoma, unspecified site: Secondary | ICD-10-CM

## 2013-08-09 DIAGNOSIS — C8593 Non-Hodgkin lymphoma, unspecified, intra-abdominal lymph nodes: Secondary | ICD-10-CM

## 2013-08-09 DIAGNOSIS — C8583 Other specified types of non-Hodgkin lymphoma, intra-abdominal lymph nodes: Secondary | ICD-10-CM

## 2013-08-09 DIAGNOSIS — Z5112 Encounter for antineoplastic immunotherapy: Secondary | ICD-10-CM

## 2013-08-09 DIAGNOSIS — N390 Urinary tract infection, site not specified: Secondary | ICD-10-CM

## 2013-08-09 DIAGNOSIS — I4891 Unspecified atrial fibrillation: Secondary | ICD-10-CM

## 2013-08-09 LAB — CBC WITH DIFFERENTIAL/PLATELET
BASO%: 1.9 % (ref 0.0–2.0)
Basophils Absolute: 0.1 10*3/uL (ref 0.0–0.1)
EOS%: 4 % (ref 0.0–7.0)
Eosinophils Absolute: 0.2 10*3/uL (ref 0.0–0.5)
HEMATOCRIT: 35.2 % (ref 34.8–46.6)
HGB: 11.2 g/dL — ABNORMAL LOW (ref 11.6–15.9)
LYMPH#: 1.4 10*3/uL (ref 0.9–3.3)
LYMPH%: 34.1 % (ref 14.0–49.7)
MCH: 28 pg (ref 25.1–34.0)
MCHC: 31.7 g/dL (ref 31.5–36.0)
MCV: 88.5 fL (ref 79.5–101.0)
MONO#: 0.9 10*3/uL (ref 0.1–0.9)
MONO%: 21.5 % — ABNORMAL HIGH (ref 0.0–14.0)
NEUT#: 1.6 10*3/uL (ref 1.5–6.5)
NEUT%: 38.5 % (ref 38.4–76.8)
Platelets: 311 10*3/uL (ref 145–400)
RBC: 3.98 10*6/uL (ref 3.70–5.45)
RDW: 17.4 % — AB (ref 11.2–14.5)
WBC: 4.2 10*3/uL (ref 3.9–10.3)

## 2013-08-09 LAB — BASIC METABOLIC PANEL (CC13)
Anion Gap: 13 mEq/L — ABNORMAL HIGH (ref 3–11)
BUN: 26 mg/dL (ref 7.0–26.0)
CHLORIDE: 102 meq/L (ref 98–109)
CO2: 24 meq/L (ref 22–29)
Calcium: 13.3 mg/dL (ref 8.4–10.4)
Creatinine: 2.2 mg/dL — ABNORMAL HIGH (ref 0.6–1.1)
Glucose: 128 mg/dl (ref 70–140)
POTASSIUM: 4.3 meq/L (ref 3.5–5.1)
SODIUM: 140 meq/L (ref 136–145)

## 2013-08-09 LAB — PROTIME-INR
INR: 3.6 — AB (ref 2.00–3.50)
PROTIME: 43.2 s — AB (ref 10.6–13.4)

## 2013-08-09 LAB — POCT INR: INR: 3.6

## 2013-08-09 MED ORDER — SODIUM CHLORIDE 0.9 % IV SOLN
375.0000 mg/m2 | Freq: Once | INTRAVENOUS | Status: AC
  Administered 2013-08-09: 800 mg via INTRAVENOUS
  Filled 2013-08-09: qty 80

## 2013-08-09 MED ORDER — DIPHENHYDRAMINE HCL 25 MG PO CAPS
ORAL_CAPSULE | ORAL | Status: AC
  Filled 2013-08-09: qty 2

## 2013-08-09 MED ORDER — ACETAMINOPHEN 325 MG PO TABS
650.0000 mg | ORAL_TABLET | Freq: Once | ORAL | Status: AC
  Administered 2013-08-09: 650 mg via ORAL

## 2013-08-09 MED ORDER — SODIUM CHLORIDE 0.9 % IV SOLN
Freq: Once | INTRAVENOUS | Status: AC
  Administered 2013-08-09: 12:00:00 via INTRAVENOUS

## 2013-08-09 MED ORDER — DIPHENHYDRAMINE HCL 25 MG PO CAPS
50.0000 mg | ORAL_CAPSULE | Freq: Once | ORAL | Status: AC
  Administered 2013-08-09: 50 mg via ORAL

## 2013-08-09 MED ORDER — CEPHALEXIN 500 MG PO CAPS: 500.0000 mg | ORAL_CAPSULE | Freq: Two times a day (BID) | ORAL | Status: DC

## 2013-08-09 MED ORDER — ACETAMINOPHEN 325 MG PO TABS
ORAL_TABLET | ORAL | Status: AC
  Filled 2013-08-09: qty 2

## 2013-08-09 NOTE — Progress Notes (Signed)
Per Dr. [name]- patient to receive 500 mL NS over two hours before Rituxan infusion. OK to proceed with Rituxan, noting Calcium and Creatinine.

## 2013-08-09 NOTE — Telephone Encounter (Signed)
Talked to niece, the care of the patient has been turned over to the son, and he is usually not taking care of the mom as he should per niece, she will find out why pt did not amke it to chemo today, Pt hone staying busy

## 2013-08-09 NOTE — Patient Instructions (Addendum)
   [place] Discharge Instructions for Patients Receiving Chemotherapy  Today you received the following chemotherapy agents: Rituxan.  To help prevent nausea and vomiting after your treatment, we encourage you to take your nausea medication.  If you develop nausea and vomiting that is not controlled by your nausea medication, call the clinic.   BELOW ARE SYMPTOMS THAT SHOULD BE REPORTED IMMEDIATELY:  *FEVER GREATER THAN 100.5 F  *CHILLS WITH OR WITHOUT FEVER  NAUSEA AND VOMITING THAT IS NOT CONTROLLED WITH YOUR NAUSEA MEDICATION  *UNUSUAL SHORTNESS OF BREATH  *UNUSUAL BRUISING OR BLEEDING  TENDERNESS IN MOUTH AND THROAT WITH OR WITHOUT PRESENCE OF ULCERS  *URINARY PROBLEMS  *BOWEL PROBLEMS  UNUSUAL RASH Items with * indicate a potential emergency and should be followed up as soon as possible.  Feel free to call the clinic you have any questions or concerns. The clinic phone number is [phone]

## 2013-08-09 NOTE — Progress Notes (Signed)
INR = 3.6 Pt was previously on Coumadin 5 mg/day except 2.5 mg on Tu/Th.  A recent INR (interpreted by [place] on 08/07/13) was 1.6.  On 08/07/13 she was instructed to take Coumadin 5 mg daily.  She did follow those dosing instructions. I met w/ pt today during treatment.  We had a nice conversation.  She has limitations w/ transportation (rides the bus) and mobility (requires wheelchair assistance). Her son lives with her but he isn't always home.  He travels with his work and may be out of town for 2-3 days of the week. We discussed diet and Coumadin.  She does eat green vegetables, mainly cabbage/slaw.  We discussed the importance of consistent amounts of vit K in the diet and avoiding drastic shifts in vit K consumption. She is starting Keflex x 7 days today for UTI; s/p renal stent placement.  I suspect this will further elevate her INR so I will need to decrease her Coumadin dose. I instructed pt to change Coumadin dose to 2.5 mg daily except 5 mg on MWF.  She understands the plan. She has a home care nurse w/ [facility] (services for 3 weeks).  I s/w Mickel Baas, RN (ph# [phone]) but she is not Ms Hughlett's primary RN.  I gave her v.o to check pts INR on 08/18/13 at her home.  Pt understands plan. Mickel Baas will not be working on 5/8.  Another RN will draw the INR that day.  They know to call us or fax [place] the results.  If there are problems, we should call the main # for [facility] [phone]). We can get INR's through [place] for now but once they discharge pt, we will need to check pt when she has other scheduled appts here due to transportation restrictions. Kennith Center, Pharm.D., CPP 08/09/2013@1 :34 PM

## 2013-08-09 NOTE — Telephone Encounter (Signed)
S/w kim from [facility] and she will contact the pt regarding the appt with dr Loanne Drilling.

## 2013-08-10 ENCOUNTER — Telehealth: Payer: Self-pay | Admitting: Medical Oncology

## 2013-08-10 NOTE — Telephone Encounter (Signed)
Marlowe Kays from [facility] called asking if Dr. [name] would give orders to extend PT/OT. They feel pt could benefit from extra therapy. They would see pt twice a week for the next three weeks. Per Dr. [name] he agrees and [place] given orders.

## 2013-08-11 ENCOUNTER — Ambulatory Visit: Payer: Medicare Other | Admitting: Pharmacist

## 2013-08-11 DIAGNOSIS — B952 Enterococcus as the cause of diseases classified elsewhere: Secondary | ICD-10-CM

## 2013-08-11 DIAGNOSIS — N390 Urinary tract infection, site not specified: Secondary | ICD-10-CM

## 2013-08-11 DIAGNOSIS — I4891 Unspecified atrial fibrillation: Secondary | ICD-10-CM

## 2013-08-11 DIAGNOSIS — C8593 Non-Hodgkin lymphoma, unspecified, intra-abdominal lymph nodes: Secondary | ICD-10-CM

## 2013-08-11 HISTORY — DX: Urinary tract infection, site not specified: N39.0

## 2013-08-11 HISTORY — DX: Urinary tract infection, site not specified: B95.2

## 2013-08-11 LAB — POCT INR: INR: 5.8

## 2013-08-11 NOTE — Progress Notes (Signed)
[name] from [name] called to inform [name] that the previous coumadin instructions from [name] was for 5 mg and 2.5 mg doses Pt has 6mg  tablets ONLY from an RX filled at [facility] on 03/15/13. This RX was filled by Dr. Melford Aase or his PA,  Kelby Aline, and verified with [facility] pharmacy Called nurse to get pt INR today and it is 5.8 She has been taking 6mg  daily except 3mg  two days a week, when she was instructed to take 5mg  daily with 2.5 mg two days a week Per the [name] nurse her DC papers do not list coumadin as a medicaition.  Will verify when they call back. I have a message in to [facility] [phone])  to call and let [name] know what dose she was rec'ing and what her INR trend looked like. She is still on Keflex Her INR trend has been 4/27=1.6, 4/29=3.6 and 5/1=5.8 Talked with Dr. [name] to notify him that I will talk with PCP/Internal Medicine and notify them that we will be following this patient as long as she is being treated at the cancer center. We will have an INR called back to [name] on Mon AM from [facility] nurse. Nurse verified that patient is having no active bleeding  HH nurse states there are transportation issues I will change RX at [facility] to Dr [name] so refill request come to our office.  Future dosing needs to be based off a 6mg  tablet as that is what patient has at home

## 2013-08-11 NOTE — Patient Instructions (Signed)
Coumadin will be held for the next three days.  She will have INR checked via [facility] on Monday in AM

## 2013-08-14 ENCOUNTER — Ambulatory Visit: Payer: Medicare Other | Admitting: Pharmacist

## 2013-08-14 ENCOUNTER — Telehealth: Payer: Self-pay

## 2013-08-14 DIAGNOSIS — I4891 Unspecified atrial fibrillation: Secondary | ICD-10-CM

## 2013-08-14 DIAGNOSIS — C8593 Non-Hodgkin lymphoma, unspecified, intra-abdominal lymph nodes: Secondary | ICD-10-CM

## 2013-08-14 LAB — POCT INR: INR: 2.6

## 2013-08-14 NOTE — Patient Instructions (Signed)
Restart coumadin tonight at lower dose of 3 mg daily (half tab) except for 6 mg on Tuesdays and Saturdays.  Debbie with [facility] will recheck INR on Friday 08/18/13

## 2013-08-14 NOTE — Telephone Encounter (Signed)
shar from [employer] called asking about status of appeal for revlimid. Message forwarded to [facility].

## 2013-08-14 NOTE — Progress Notes (Signed)
*  Telephone encounter - No charge* INR at goal Received results from [place], [name] with [facility] Talked to [facility] and Patient over the phone Pt is doing well with no complaints She has been off coumadin for 3 days after supratherapeutic INR Pt was taking 6 mg daily except for 3 mg 2 days a week Pt currently on day 6/7 of Keflex abx.  Pt reports no bleeding or bruising No other diet or medication changes Plan: Restart coumadin tonight at lower dose of 3 mg daily (half tab) except for 6 mg on Tuesdays and Saturdays.  [name] with [place] will recheck INR on Friday 08/18/13

## 2013-08-15 ENCOUNTER — Telehealth: Payer: Self-pay

## 2013-08-15 NOTE — Telephone Encounter (Signed)
[employer] called stating pt did not answer survey correctly and is now flagged. Called [employer] to correct. Auth # [id number]. Flag was corrected. Called [employer] back to let them know.

## 2013-08-16 LAB — PTH, INTACT AND CALCIUM
Calcium: 11.7 mg/dL — ABNORMAL HIGH (ref 8.4–10.5)
PTH: <2.5 pg/mL — ABNORMAL LOW (ref 14.0–72.0)

## 2013-08-16 LAB — VITAMIN D 25 HYDROXY (VIT D DEFICIENCY, FRACTURES): VIT D 25 HYDROXY: 88 ng/mL (ref 30–89)

## 2013-08-16 LAB — PTH-RELATED PEPTIDE: PTH-RELATED PROTEIN (PTH-RP): 29 pg/mL — AB (ref 14–27)

## 2013-08-18 ENCOUNTER — Ambulatory Visit (INDEPENDENT_AMBULATORY_CARE_PROVIDER_SITE_OTHER): Payer: Self-pay | Admitting: Pharmacist

## 2013-08-18 ENCOUNTER — Encounter: Payer: Self-pay | Admitting: Internal Medicine

## 2013-08-18 ENCOUNTER — Other Ambulatory Visit: Payer: Self-pay | Admitting: Oncology

## 2013-08-18 ENCOUNTER — Other Ambulatory Visit: Payer: Self-pay | Admitting: Internal Medicine

## 2013-08-18 ENCOUNTER — Other Ambulatory Visit: Payer: Self-pay | Admitting: Emergency Medicine

## 2013-08-18 DIAGNOSIS — I495 Sick sinus syndrome: Secondary | ICD-10-CM

## 2013-08-18 DIAGNOSIS — C8593 Non-Hodgkin lymphoma, unspecified, intra-abdominal lymph nodes: Secondary | ICD-10-CM

## 2013-08-18 DIAGNOSIS — I4891 Unspecified atrial fibrillation: Secondary | ICD-10-CM

## 2013-08-18 DIAGNOSIS — C8583 Other specified types of non-Hodgkin lymphoma, intra-abdominal lymph nodes: Secondary | ICD-10-CM

## 2013-08-18 LAB — POCT INR: INR: 3.6

## 2013-08-18 NOTE — Progress Notes (Signed)
**  No charge-telephone encounter**  INR = 3.6 today reported by [facility] home health RN.  No bleeding or unusual bruising.  Ms Muzquiz started Revlimid on 08/16/13.  This should not interact with coumadin.   4/29 INR=3.6 Took Coumadin 5mg  and 4/30 Coumadin 2.5mg   5/1 INR = 5.8 Coumadin held x 3 days 5/4 INR= 2.6 Took Coumadin 3mg , 6mg , 3mg , 3mg  = total 15mg  5/8 INR=3.6  INR is above goal of 2-3 for afib again.  I have instructed Debbie, RN to have Ms Jessop hold coumadin today and resume at 3mg  daily.  Will check PT/INR in 1 week by [facility] RN.

## 2013-08-19 ENCOUNTER — Other Ambulatory Visit: Payer: Self-pay | Admitting: Physician Assistant

## 2013-08-19 ENCOUNTER — Other Ambulatory Visit: Payer: Self-pay | Admitting: Internal Medicine

## 2013-08-21 ENCOUNTER — Telehealth: Payer: Self-pay

## 2013-08-21 NOTE — Telephone Encounter (Signed)
Received interaction notification from [place] between citalopram and zofran. Per Dr [name] we will dc zofran and continue the compazine. S/w pharmacist.

## 2013-08-21 NOTE — Telephone Encounter (Signed)
Son [name] who is living with pt called to let us know her condition. She is not remembering things eg she will not remember he called earlier that same morning. She is vomiting frequently. She is incontinent and cannot make it to bedside commode. He went to clean the area around her chair and found that the chair was soaked through and dripping with urine. He wants to know if there is anything we can give her for the vomiting, and why she is vomiting. He wants to know if any medication for urination. I did suggest depends and covering the chair with plastic and towels until he can get a vinyl recliner. He wants to know if there is a estimate of how long his mother has to live.

## 2013-08-22 ENCOUNTER — Encounter: Payer: Self-pay | Admitting: Endocrinology

## 2013-08-22 ENCOUNTER — Other Ambulatory Visit: Payer: Self-pay | Admitting: Medical Oncology

## 2013-08-22 ENCOUNTER — Ambulatory Visit (INDEPENDENT_AMBULATORY_CARE_PROVIDER_SITE_OTHER): Payer: Medicare Other | Admitting: Endocrinology

## 2013-08-22 LAB — TSH: TSH: 0.83 u[IU]/mL (ref 0.35–4.50)

## 2013-08-22 MED ORDER — TIOTROPIUM BROMIDE MONOHYDRATE 18 MCG IN CAPS: 18.0000 ug | ORAL_CAPSULE | Freq: Every day | RESPIRATORY_TRACT | Status: DC

## 2013-08-22 MED ORDER — POTASSIUM CHLORIDE CRYS ER 10 MEQ PO TBCR: 10.0000 meq | EXTENDED_RELEASE_TABLET | Freq: Two times a day (BID) | ORAL | Status: DC

## 2013-08-22 NOTE — Progress Notes (Signed)
Subjective:    Patient ID: [name], female    DOB: 1940/05/07, 73 y.o.   MRN: [id number]  HPI Pt was first noted to have moderate hypercalcemia in January of 2015.  She has had lymphoma of the retroperitoneum since 2012.  It was well-controlled until a recurrence in 2014.  she has never had associated urolithiasis, PUD, immobilization, pancreatitis, sarcoidosis, or bony fracture.  she takes a vitamin-D supplement, but does not take vitamin-A.  She has never taken Li++.  She says she is not functional enough to do a 24-HR urine for Ca++.    Past Medical History  Diagnosis Date  . Sick sinus syndrome   . Hematoma     At the site of the pacemaker insertion.  . Hypotension   . Dyslipidemia   . History of atrial fibrillation   . Arthritis     Osteoarthritis  . Port-a-cath in place 12/08/10  . Status post chemotherapy completed 03/2011    R - CHOP q 3 weeks x 6  . S/P radiation therapy 05/28/2011 - 06/26/2011    Right Abdomen and Right Pelvis/3060 cGy in 17 Fractions  . Hypertension   . ASHD (arteriosclerotic heart disease)   . Gout   . COPD (chronic obstructive pulmonary disease)   . Asthma   . Hyperlipidemia   . Diabetes mellitus   . Hypothyroidism   . Diffuse large B cell lymphoma     Dr. Chis[place],oncology  . Cancer 10/2010    large B cell lymphoma  . 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 Pacemaker     left chest  . Dysrhythmia     Hx. A. Fib, sick sinus syndrome- Pacemaker inserted.  . Cataracts, bilateral     Past Surgical History  Procedure Laterality Date  . Pacemaker insertion      Lead revision completed August 04, 2006- left chest G. Lovena Le  . Tubal ligation      Bilateral  . Insert / replace / remove pacemaker    . Abdominal hysterectomy    . Biopsy stomach  11/03/10    Soft Tissue Mass, Biopsy, Right Lower Quadrant Mesenteric Mass- High Grade Non-Hodgkins B Cell Lymphoma  with Flow Cytometry  . Bone biopsy  11/24/10      Bone Marrow, Aspirate Biospy, and clot, Left - No Involvement of Non-Hodgkin's Lymphoma Identified  . Portacath placement Right 06-23-13  . Cholecystectomy      laparoscopic  . Bladder suspension      History   Social History  . Marital Status: Married    Spouse Name: N/A    Number of Children: N/A  . Years of Education: N/A   Occupational History  . [employer]   . Filling Microbiologist    Social History Main Topics  . Smoking status: Former Smoker -- 1.50 packs/day for 30 years    Types: Cigarettes    Quit date: 03/14/2011  . Smokeless tobacco: Never Used  . Alcohol Use: No  . Drug Use: No  . Sexual Activity: No   Other Topics Concern  . Not on file   Social History Narrative  . No narrative on file    Current Outpatient Prescriptions on File Prior to Visit  Medication Sig Dispense Refill  . albuterol (PROVENTIL) (2.5 MG/3ML) 0.083% nebulizer solution Take 3 mLs (2.5 mg total) by nebulization every 6 (six) hours as needed for shortness of breath.  75 mL  6  . allopurinol (ZYLOPRIM)  300 MG tablet TAKE 1 TABLET BY MOUTH DAILY  90 tablet  0  . atorvastatin (LIPITOR) 10 MG tablet Take 10 mg by mouth daily at 6 PM.      . cephALEXin (KEFLEX) 500 MG capsule Take 1 capsule (500 mg total) by mouth 2 (two) times daily.  14 capsule  0  . Cholecalciferol (VITAMIN D3) 2000 UNITS TABS Take 4,000 Units by mouth 2 (two) times daily.       . citalopram (CELEXA) 20 MG tablet TAKE 1 TABLET BY MOUTH DAILY FOR MOOD  90 tablet  0  . diltiazem (CARDIZEM CD) 180 MG 24 hr capsule TAKE 1 CAPSULE BY MOUTH DAILY  90 capsule  98  . glycerin, Pediatric, 1.2 G SUPP Place 1 suppository rectally daily as needed for moderate constipation.      Marland Kitchen guaiFENesin (MUCINEX) 600 MG 12 hr tablet Take 600 mg by mouth 2 (two) times daily.      Marland Kitchen lenalidomide (REVLIMID) 15 MG capsule Take 1 capsule (15 mg total) by mouth every other day.  14 capsule  1  . levothyroxine (SYNTHROID, LEVOTHROID) 50 MCG tablet  Take 50 mcg by mouth daily before breakfast.      . levothyroxine (SYNTHROID, LEVOTHROID) 50 MCG tablet TAKE 1 TABLET (50 MCG TOTAL) BY MOUTH DAILY BEFORE BREAKFAST.  30 tablet  2  . lidocaine-prilocaine (EMLA) cream Apply topically as needed. Apply to port a cath site one hour before needle stick as needed.  30 g  2  . magnesium oxide (MAG-OX) 400 (241.3 MG) MG tablet Take 0.5 tablets (200 mg total) by mouth daily.  30 tablet  0  . metFORMIN (GLUCOPHAGE-XR) 500 MG 24 hr tablet TAKE 2 TABLETS BY MOUTH TWICE DAILY  360 tablet  98  . ondansetron (ZOFRAN) 8 MG tablet TAKE 1 TABLET BY MOUTH EVERY 12 HOURS AS NEEDED FOR NAUSEA  20 tablet  0  . polyethylene glycol (MIRALAX / GLYCOLAX) packet Take 17 g by mouth 2 (two) times daily.  14 each    . pravastatin (PRAVACHOL) 40 MG tablet TAKE 1 TABLET BY MOUTH EVERY NIGHT AT BEDTIME FOR CHOLESTEROL  90 tablet  98  . prochlorperazine (COMPAZINE) 10 MG tablet TAKE 1 TABLET BY MOUTH EVERY 6 HOURS AS NEEDED FOR NAUSEA  30 tablet  0  . sennosides-docusate sodium (SENOKOT-S) 8.6-50 MG tablet Take 2 tablets by mouth 2 (two) times daily.       Marland Kitchen URELLE (URELLE/URISED) 81 MG TABS tablet Take 1 tablet (81 mg total) by mouth 3 (three) times daily as needed for bladder spasms.      Marland Kitchen warfarin (COUMADIN) 5 MG tablet Take 5 mg by mouth daily at 6 PM.       . warfarin (COUMADIN) 6 MG tablet TAKE 1 TABLET BY MOUTH DAILY OR AS DIRECTED  100 tablet  0   No current facility-administered medications on file prior to visit.    Allergies  Allergen Reactions  . Codeine Nausea Only  . Penicillins     Unknown   Family History  Problem Relation Age of Onset  . Stroke Mother   . Stroke Father   . Aneurysm Son     Brain  . Cancer Sister      1 sister had vaginal cancer  . Cancer Brother     Prostate   There were no vitals taken for this visit.  Review of Systems denies weight loss, galactorrhea, hematuria, cold intolerance, numbness, arthralgias, abdominal pain, muscle  weakness, skin rash, visual loss, sob, constipation, rhinorrhea, easy bruising, and depression.  She has memory loss and urinary frequency.    Objective:   Physical Exam VS: see vs page GEN: no distress.  Morbid obesity.  In wheelchair HEAD: head: no deformity eyes: no periorbital swelling, no proptosis external nose and ears are normal mouth: no lesion seen NECK: supple, thyroid is not enlarged CHEST WALL: no deformity LUNGS:  Clear to auscultation CV: reg rate and rhythm, no murmur ABD: abdomen is soft, nontender.  no hepatosplenomegaly.  not distended.  no hernia.   MUSCULOSKELETAL: muscle bulk and strength are grossly normal.  no obvious joint swelling.  gait is normal and steady EXTEMITIES: no deformity.  no ulcer on the feet.  feet are of normal color and temp.  no edema PULSES: dorsalis pedis intact bilat.  no carotid bruit NEURO:  cn 2-12 grossly intact.   readily moves all 4's.  sensation is intact to touch on the feet SKIN:  Normal texture and temperature.  No rash or suspicious lesion is visible.   NODES:  None palpable at the neck PSYCH: alert, well-oriented.  Does not appear anxious nor depressed.   PTHrP is slightly high Lab Results  Component Value Date   PTH <2.5* 08/09/2013   CALCIUM 11.7* 08/09/2013   Lab Results  Component Value Date   CREATININE 2.2* 08/09/2013   BUN 26.0 08/09/2013   NA 140 08/09/2013   K 4.3 08/09/2013   CL 96 07/14/2013   CO2 24 08/09/2013   Lab Results  Component Value Date   ALT 13 08/02/2013   AST 22 08/02/2013   ALKPHOS 75 08/02/2013   BILITOT 0.42 08/02/2013   Lab Results  Component Value Date   CALCIUM 11.7* 08/09/2013  25-OH vit-D=88    Assessment & Plan:  Hypercalcemia: she has several possible causes.  This causes high risk to her health.  Possible treatment options are aggressive rx of the  cancer, steroids (if 1,25 vit-D is high), sensipar, or pamidronate.  None of these is really right for her situation, but labs ordered today  will help with a decision. Lymphoma: this could raise either PTH-rp or 1,25-OH vit-D. Renal failure: this could mitigate any increased 1,25-OH vit-D. Memory loss: possibly exac by hypercalcemia.

## 2013-08-22 NOTE — Patient Instructions (Addendum)
Please stop taking the vitamin-D pill.   blood tests are being requested for you today.  We'll contact you with results.   Based on the results, I can recommend medication for the high calcium.   Please drink plenty of fluids, as this helps you eliminate the calcium.

## 2013-08-23 ENCOUNTER — Other Ambulatory Visit: Payer: Medicare Other

## 2013-08-23 ENCOUNTER — Telehealth: Payer: Self-pay | Admitting: Internal Medicine

## 2013-08-23 ENCOUNTER — Other Ambulatory Visit: Payer: Self-pay | Admitting: Physician Assistant

## 2013-08-23 ENCOUNTER — Ambulatory Visit: Payer: Medicare Other

## 2013-08-23 ENCOUNTER — Telehealth: Payer: Self-pay

## 2013-08-23 NOTE — Telephone Encounter (Signed)
Pt called to let us know why she missed todays appt. Her son changed a door and she could not get it open, then her wheelchair ran out of battery and she could not get it to back up. She also had a bout with diarrhea. She thus missed her ride to [facility]. She has rescheduled to 08/30/13.

## 2013-08-23 NOTE — Telephone Encounter (Signed)
pt called to r/s missed appt...done...pt ok and aware °

## 2013-08-24 LAB — PROTEIN ELECTROPHORESIS, SERUM
ALPHA-2-GLOBULIN: 19.3 % — AB (ref 7.1–11.8)
Albumin ELP: 52.1 % — ABNORMAL LOW (ref 55.8–66.1)
Alpha-1-Globulin: 7.9 % — ABNORMAL HIGH (ref 2.9–4.9)
Beta 2: 6 % (ref 3.2–6.5)
Beta Globulin: 6.6 % (ref 4.7–7.2)
Gamma Globulin: 8.1 % — ABNORMAL LOW (ref 11.1–18.8)
TOTAL PROTEIN, SERUM ELECTROPHOR: 6.7 g/dL (ref 6.0–8.3)

## 2013-08-24 LAB — VITAMIN A: Vitamin A (Retinoic Acid): 35 ug/dL — ABNORMAL LOW (ref 38–98)

## 2013-08-25 ENCOUNTER — Ambulatory Visit: Payer: Medicare Other | Admitting: Pharmacist

## 2013-08-25 ENCOUNTER — Telehealth: Payer: Self-pay

## 2013-08-25 ENCOUNTER — Other Ambulatory Visit: Payer: Self-pay | Admitting: *Deleted

## 2013-08-25 ENCOUNTER — Other Ambulatory Visit: Payer: Self-pay | Admitting: Internal Medicine

## 2013-08-25 DIAGNOSIS — C8593 Non-Hodgkin lymphoma, unspecified, intra-abdominal lymph nodes: Secondary | ICD-10-CM

## 2013-08-25 DIAGNOSIS — C8583 Other specified types of non-Hodgkin lymphoma, intra-abdominal lymph nodes: Secondary | ICD-10-CM

## 2013-08-25 DIAGNOSIS — I4891 Unspecified atrial fibrillation: Secondary | ICD-10-CM

## 2013-08-25 DIAGNOSIS — N390 Urinary tract infection, site not specified: Secondary | ICD-10-CM

## 2013-08-25 LAB — POCT INR: INR: 2.6

## 2013-08-25 MED ORDER — SULFAMETHOXAZOLE-TRIMETHOPRIM 800-160 MG PO TABS: 1.0000 | ORAL_TABLET | Freq: Once | ORAL | Status: DC

## 2013-08-25 MED ORDER — FOSFOMYCIN TROMETHAMINE 3 G PO PACK: 3.0000 g | PACK | Freq: Once | ORAL | Status: DC

## 2013-08-25 NOTE — Progress Notes (Signed)
**  Telephone encounter-no charge**  [place], [place] RN called and reported INR=2.6 today.  Debbie stated that Ms Riso has stopped her Vit D and is not taking Revlimid as prescribed.  I spoke to Ms [place] on phone.  She c/o of increase urine frequency.  I encouraged her to call PCP at [facility].  Will continue coumadin 3mg  daily and check PT/INR in one week by [place].

## 2013-08-25 NOTE — Telephone Encounter (Signed)
Dana Berdine Addison RN with [facility] called stating pt has s/s of UTI of burning frequency and urgency. Pt agrees. S/w Dr [name] and he sent in [place].

## 2013-08-28 ENCOUNTER — Telehealth: Payer: Self-pay | Admitting: *Deleted

## 2013-08-28 ENCOUNTER — Telehealth: Payer: Self-pay

## 2013-08-28 LAB — VITAMIN D 1,25 DIHYDROXY
Vitamin D 1, 25 (OH)2 Total: 169 pg/mL — ABNORMAL HIGH (ref 18–72)
Vitamin D2 1, 25 (OH)2: <8 pg/mL
Vitamin D3 1, 25 (OH)2: 169 pg/mL

## 2013-08-28 NOTE — Telephone Encounter (Signed)
S/w Debbie about pt inability to take Revlimid correctly. Pt is supposed to be on every other day, Jackelyn Poling says she may miss some days or take it every day. Jackelyn Poling was on her way to [employer] to try and find a pill box dispenser for the family. She will visit [place] today. She will call if she needs more instruction.

## 2013-08-28 NOTE — Telephone Encounter (Signed)
Nurse with [facility] called reporting patient's b/p today = 28/76 and her systolic B/P normally runs in the low 110's.  Will notify providers.  Noted appointment tomorrow with Dr. Melford Aase but Nurse reports patient did not make transportation arrangements for this appointment and doesn't know if she will be able to keep this appointment.

## 2013-08-29 ENCOUNTER — Other Ambulatory Visit: Payer: Self-pay | Admitting: Endocrinology

## 2013-08-29 ENCOUNTER — Ambulatory Visit: Payer: Self-pay | Admitting: Internal Medicine

## 2013-08-29 ENCOUNTER — Telehealth: Payer: Self-pay

## 2013-08-29 MED ORDER — DEXAMETHASONE 2 MG PO TABS: 2.0000 mg | ORAL_TABLET | Freq: Two times a day (BID) | ORAL | Status: AC

## 2013-08-29 NOTE — Telephone Encounter (Signed)
Returning Connie's call with verbal order from Dr [name] to extend OT for 1 time per week for 3 weeks.

## 2013-08-30 ENCOUNTER — Encounter (HOSPITAL_COMMUNITY): Payer: Self-pay | Admitting: Emergency Medicine

## 2013-08-30 ENCOUNTER — Other Ambulatory Visit (HOSPITAL_BASED_OUTPATIENT_CLINIC_OR_DEPARTMENT_OTHER): Payer: Medicare Other

## 2013-08-30 ENCOUNTER — Emergency Department (HOSPITAL_COMMUNITY): Payer: Medicare Other

## 2013-08-30 ENCOUNTER — Inpatient Hospital Stay (HOSPITAL_COMMUNITY)
Admission: EM | Admit: 2013-08-30 | Discharge: 2013-09-07 | DRG: 871 | Disposition: A | Discharge status: Skilled Nursing Facility | Payer: Medicare Other | Attending: Internal Medicine | Admitting: Internal Medicine

## 2013-08-30 ENCOUNTER — Other Ambulatory Visit: Payer: Self-pay

## 2013-08-30 ENCOUNTER — Encounter: Payer: Self-pay | Admitting: Internal Medicine

## 2013-08-30 ENCOUNTER — Ambulatory Visit (HOSPITAL_BASED_OUTPATIENT_CLINIC_OR_DEPARTMENT_OTHER): Payer: Medicare Other | Admitting: Internal Medicine

## 2013-08-30 VITALS — BP 105/38 | HR 69 | Temp 97.7°F | Resp 19

## 2013-08-30 DIAGNOSIS — D638 Anemia in other chronic diseases classified elsewhere: Secondary | ICD-10-CM

## 2013-08-30 DIAGNOSIS — I4891 Unspecified atrial fibrillation: Secondary | ICD-10-CM

## 2013-08-30 DIAGNOSIS — Z823 Family history of stroke: Secondary | ICD-10-CM

## 2013-08-30 DIAGNOSIS — R531 Weakness: Secondary | ICD-10-CM | POA: Diagnosis present

## 2013-08-30 DIAGNOSIS — E785 Hyperlipidemia, unspecified: Secondary | ICD-10-CM | POA: Diagnosis present

## 2013-08-30 DIAGNOSIS — Z5181 Encounter for therapeutic drug level monitoring: Secondary | ICD-10-CM

## 2013-08-30 DIAGNOSIS — J449 Chronic obstructive pulmonary disease, unspecified: Secondary | ICD-10-CM | POA: Diagnosis present

## 2013-08-30 DIAGNOSIS — E872 Acidosis, unspecified: Secondary | ICD-10-CM | POA: Diagnosis present

## 2013-08-30 DIAGNOSIS — N179 Acute kidney failure, unspecified: Secondary | ICD-10-CM

## 2013-08-30 DIAGNOSIS — C8593 Non-Hodgkin lymphoma, unspecified, intra-abdominal lymph nodes: Secondary | ICD-10-CM

## 2013-08-30 DIAGNOSIS — Z923 Personal history of irradiation: Secondary | ICD-10-CM

## 2013-08-30 DIAGNOSIS — Z9221 Personal history of antineoplastic chemotherapy: Secondary | ICD-10-CM

## 2013-08-30 DIAGNOSIS — D709 Neutropenia, unspecified: Secondary | ICD-10-CM

## 2013-08-30 DIAGNOSIS — J45901 Unspecified asthma with (acute) exacerbation: Secondary | ICD-10-CM

## 2013-08-30 DIAGNOSIS — N189 Chronic kidney disease, unspecified: Secondary | ICD-10-CM

## 2013-08-30 DIAGNOSIS — Z79899 Other long term (current) drug therapy: Secondary | ICD-10-CM

## 2013-08-30 DIAGNOSIS — B952 Enterococcus as the cause of diseases classified elsewhere: Secondary | ICD-10-CM | POA: Diagnosis present

## 2013-08-30 DIAGNOSIS — A419 Sepsis, unspecified organism: Principal | ICD-10-CM

## 2013-08-30 DIAGNOSIS — D61810 Antineoplastic chemotherapy induced pancytopenia: Secondary | ICD-10-CM

## 2013-08-30 DIAGNOSIS — E875 Hyperkalemia: Secondary | ICD-10-CM | POA: Diagnosis present

## 2013-08-30 DIAGNOSIS — E039 Hypothyroidism, unspecified: Secondary | ICD-10-CM | POA: Diagnosis present

## 2013-08-30 DIAGNOSIS — G9340 Encephalopathy, unspecified: Secondary | ICD-10-CM

## 2013-08-30 DIAGNOSIS — R5081 Fever presenting with conditions classified elsewhere: Secondary | ICD-10-CM

## 2013-08-30 DIAGNOSIS — N183 Chronic kidney disease, stage 3 unspecified: Secondary | ICD-10-CM | POA: Diagnosis present

## 2013-08-30 DIAGNOSIS — E119 Type 2 diabetes mellitus without complications: Secondary | ICD-10-CM | POA: Diagnosis present

## 2013-08-30 DIAGNOSIS — N184 Chronic kidney disease, stage 4 (severe): Secondary | ICD-10-CM | POA: Diagnosis present

## 2013-08-30 DIAGNOSIS — Z7901 Long term (current) use of anticoagulants: Secondary | ICD-10-CM

## 2013-08-30 DIAGNOSIS — K5900 Constipation, unspecified: Secondary | ICD-10-CM

## 2013-08-30 DIAGNOSIS — Z87891 Personal history of nicotine dependence: Secondary | ICD-10-CM

## 2013-08-30 DIAGNOSIS — C8330 Diffuse large B-cell lymphoma, unspecified site: Secondary | ICD-10-CM | POA: Diagnosis present

## 2013-08-30 DIAGNOSIS — E860 Dehydration: Secondary | ICD-10-CM | POA: Diagnosis present

## 2013-08-30 DIAGNOSIS — I129 Hypertensive chronic kidney disease with stage 1 through stage 4 chronic kidney disease, or unspecified chronic kidney disease: Secondary | ICD-10-CM | POA: Diagnosis present

## 2013-08-30 DIAGNOSIS — J9819 Other pulmonary collapse: Secondary | ICD-10-CM | POA: Diagnosis present

## 2013-08-30 DIAGNOSIS — R6520 Severe sepsis without septic shock: Secondary | ICD-10-CM | POA: Diagnosis present

## 2013-08-30 DIAGNOSIS — Z950 Presence of cardiac pacemaker: Secondary | ICD-10-CM | POA: Diagnosis present

## 2013-08-30 DIAGNOSIS — C8589 Other specified types of non-Hodgkin lymphoma, extranodal and solid organ sites: Secondary | ICD-10-CM | POA: Diagnosis present

## 2013-08-30 DIAGNOSIS — N390 Urinary tract infection, site not specified: Secondary | ICD-10-CM | POA: Diagnosis present

## 2013-08-30 DIAGNOSIS — N139 Obstructive and reflux uropathy, unspecified: Secondary | ICD-10-CM | POA: Diagnosis present

## 2013-08-30 DIAGNOSIS — E1169 Type 2 diabetes mellitus with other specified complication: Secondary | ICD-10-CM | POA: Diagnosis present

## 2013-08-30 DIAGNOSIS — E669 Obesity, unspecified: Secondary | ICD-10-CM | POA: Diagnosis present

## 2013-08-30 DIAGNOSIS — Z6841 Body Mass Index (BMI) 40.0 and over, adult: Secondary | ICD-10-CM

## 2013-08-30 DIAGNOSIS — J42 Unspecified chronic bronchitis: Secondary | ICD-10-CM

## 2013-08-30 DIAGNOSIS — T451X5A Adverse effect of antineoplastic and immunosuppressive drugs, initial encounter: Secondary | ICD-10-CM | POA: Diagnosis present

## 2013-08-30 DIAGNOSIS — G9341 Metabolic encephalopathy: Secondary | ICD-10-CM | POA: Diagnosis present

## 2013-08-30 DIAGNOSIS — J441 Chronic obstructive pulmonary disease with (acute) exacerbation: Secondary | ICD-10-CM | POA: Diagnosis present

## 2013-08-30 DIAGNOSIS — I2510 Atherosclerotic heart disease of native coronary artery without angina pectoris: Secondary | ICD-10-CM | POA: Diagnosis present

## 2013-08-30 DIAGNOSIS — N170 Acute kidney failure with tubular necrosis: Secondary | ICD-10-CM | POA: Diagnosis present

## 2013-08-30 DIAGNOSIS — J189 Pneumonia, unspecified organism: Secondary | ICD-10-CM | POA: Diagnosis present

## 2013-08-30 DIAGNOSIS — R197 Diarrhea, unspecified: Secondary | ICD-10-CM | POA: Diagnosis present

## 2013-08-30 LAB — COMPREHENSIVE METABOLIC PANEL (CC13)
ALT: 19 U/L (ref 0–55)
AST: 24 U/L (ref 5–34)
Albumin: 3.4 g/dL — ABNORMAL LOW (ref 3.5–5.0)
Alkaline Phosphatase: 71 U/L (ref 40–150)
Anion Gap: 18 mEq/L — ABNORMAL HIGH (ref 3–11)
BILIRUBIN TOTAL: 0.36 mg/dL (ref 0.20–1.20)
BUN: 86.3 mg/dL — ABNORMAL HIGH (ref 7.0–26.0)
CO2: 14 mEq/L — ABNORMAL LOW (ref 22–29)
Calcium: 11.7 mg/dL — ABNORMAL HIGH (ref 8.4–10.4)
Chloride: 100 mEq/L (ref 98–109)
Creatinine: 7.6 mg/dL (ref 0.6–1.1)
GLUCOSE: 103 mg/dL (ref 70–140)
Potassium: 5.5 mEq/L — ABNORMAL HIGH (ref 3.5–5.1)
SODIUM: 132 meq/L — AB (ref 136–145)
TOTAL PROTEIN: 6.6 g/dL (ref 6.4–8.3)

## 2013-08-30 LAB — URINALYSIS, ROUTINE W REFLEX MICROSCOPIC
BILIRUBIN URINE: NEGATIVE
GLUCOSE, UA: NEGATIVE mg/dL
Ketones, ur: NEGATIVE mg/dL
Nitrite: NEGATIVE
PROTEIN: 100 mg/dL — AB
Specific Gravity, Urine: 1.013 (ref 1.005–1.030)
Urobilinogen, UA: 0.2 mg/dL (ref 0.0–1.0)
pH: 5 (ref 5.0–8.0)

## 2013-08-30 LAB — CBC WITH DIFFERENTIAL/PLATELET
BASO%: 2 % (ref 0.0–2.0)
BASOS PCT: 2 % — AB (ref 0–1)
Basophils Absolute: 0 10*3/uL (ref 0.0–0.1)
Basophils Absolute: 0 10*3/uL (ref 0.0–0.1)
EOS ABS: 0.1 10*3/uL (ref 0.0–0.5)
EOS%: 3.5 % (ref 0.0–7.0)
Eosinophils Absolute: 0.1 10*3/uL (ref 0.0–0.7)
Eosinophils Relative: 3 % (ref 0–5)
HCT: 34.1 % — ABNORMAL LOW (ref 34.8–46.6)
HEMATOCRIT: 31 % — AB (ref 36.0–46.0)
HGB: 10.8 g/dL — ABNORMAL LOW (ref 11.6–15.9)
Hemoglobin: 10.3 g/dL — ABNORMAL LOW (ref 12.0–15.0)
LYMPH%: 66.2 % — AB (ref 14.0–49.7)
LYMPHS ABS: 1.2 10*3/uL (ref 0.7–4.0)
LYMPHS PCT: 69 % — AB (ref 12–46)
MCH: 27.5 pg (ref 25.1–34.0)
MCH: 27.7 pg (ref 26.0–34.0)
MCHC: 31.8 g/dL (ref 31.5–36.0)
MCHC: 33.2 g/dL (ref 30.0–36.0)
MCV: 86.7 fL (ref 79.5–101.0)
MCV: 83.3 fL (ref 78.0–100.0)
MONO#: 0.4 10*3/uL (ref 0.1–0.9)
MONO%: 20.5 % — AB (ref 0.0–14.0)
MONOS PCT: 23 % — AB (ref 3–12)
Monocytes Absolute: 0.4 10*3/uL (ref 0.1–1.0)
NEUT#: 0.2 10*3/uL — CL (ref 1.5–6.5)
NEUT%: 7.8 % — AB (ref 38.4–76.8)
NEUTROS ABS: 0.1 10*3/uL — AB (ref 1.7–7.7)
Neutrophils Relative %: 3 % — ABNORMAL LOW (ref 43–77)
PLATELETS: 225 10*3/uL (ref 145–400)
Platelets: 188 10*3/uL (ref 150–400)
RBC: 3.94 10*6/uL (ref 3.70–5.45)
RBC: 3.72 MIL/uL — ABNORMAL LOW (ref 3.87–5.11)
RDW: 18 % — ABNORMAL HIGH (ref 11.2–14.5)
RDW: 16.3 % — ABNORMAL HIGH (ref 11.5–15.5)
WBC: 2 10*3/uL — AB (ref 3.9–10.3)
WBC: 1.8 10*3/uL — AB (ref 4.0–10.5)
lymph#: 1.3 10*3/uL (ref 0.9–3.3)

## 2013-08-30 LAB — URINE MICROSCOPIC-ADD ON

## 2013-08-30 LAB — COMPREHENSIVE METABOLIC PANEL
ALK PHOS: 68 U/L (ref 39–117)
ALT: 17 U/L (ref 0–35)
AST: 24 U/L (ref 0–37)
Albumin: 3.5 g/dL (ref 3.5–5.2)
BUN: 85 mg/dL — ABNORMAL HIGH (ref 6–23)
CHLORIDE: 92 meq/L — AB (ref 96–112)
CO2: 14 mEq/L — ABNORMAL LOW (ref 19–32)
Calcium: 11.5 mg/dL — ABNORMAL HIGH (ref 8.4–10.5)
Creatinine, Ser: 7.59 mg/dL — ABNORMAL HIGH (ref 0.50–1.10)
GFR calc Af Amer: 5 mL/min — ABNORMAL LOW (ref >90)
GFR calc non Af Amer: 5 mL/min — ABNORMAL LOW (ref >90)
GLUCOSE: 91 mg/dL (ref 70–99)
POTASSIUM: 5.7 meq/L — AB (ref 3.7–5.3)
Sodium: 130 mEq/L — ABNORMAL LOW (ref 137–147)
Total Bilirubin: 0.2 mg/dL — ABNORMAL LOW (ref 0.3–1.2)
Total Protein: 6.6 g/dL (ref 6.0–8.3)

## 2013-08-30 LAB — BASIC METABOLIC PANEL
BUN: 84 mg/dL — AB (ref 6–23)
CO2: 16 mEq/L — ABNORMAL LOW (ref 19–32)
CREATININE: 7.37 mg/dL — AB (ref 0.50–1.10)
Calcium: 10.5 mg/dL (ref 8.4–10.5)
Chloride: 98 mEq/L (ref 96–112)
GFR calc non Af Amer: 5 mL/min — ABNORMAL LOW (ref >90)
GFR, EST AFRICAN AMERICAN: 6 mL/min — AB (ref >90)
Glucose, Bld: 111 mg/dL — ABNORMAL HIGH (ref 70–99)
POTASSIUM: 4.6 meq/L (ref 3.7–5.3)
Sodium: 134 mEq/L — ABNORMAL LOW (ref 137–147)

## 2013-08-30 LAB — GLUCOSE, CAPILLARY
GLUCOSE-CAPILLARY: 154 mg/dL — AB (ref 70–99)
Glucose-Capillary: 86 mg/dL (ref 70–99)

## 2013-08-30 LAB — PROTHROMBIN TIME
INR: 4.4 — ABNORMAL HIGH (ref <1.50)
Prothrombin Time: 40.3 seconds — ABNORMAL HIGH (ref 11.6–15.2)

## 2013-08-30 LAB — PROTIME-INR

## 2013-08-30 LAB — LIPASE, BLOOD: Lipase: 72 U/L — ABNORMAL HIGH (ref 11–59)

## 2013-08-30 LAB — LACTATE DEHYDROGENASE (CC13): LDH: 388 U/L — AB (ref 125–245)

## 2013-08-30 LAB — I-STAT CG4 LACTIC ACID, ED: Lactic Acid, Venous: 2.3 mmol/L — ABNORMAL HIGH (ref 0.5–2.2)

## 2013-08-30 LAB — MRSA PCR SCREENING: MRSA by PCR: NEGATIVE

## 2013-08-30 MED ORDER — INSULIN ASPART 100 UNIT/ML ~~LOC~~ SOLN
0.0000 [IU] | Freq: Three times a day (TID) | SUBCUTANEOUS | Status: DC
  Administered 2013-08-31: 2 [IU] via SUBCUTANEOUS
  Administered 2013-09-01: 1 [IU] via SUBCUTANEOUS
  Administered 2013-09-01: 2 [IU] via SUBCUTANEOUS
  Administered 2013-09-02: 1 [IU] via SUBCUTANEOUS
  Administered 2013-09-02 – 2013-09-03 (×2): 2 [IU] via SUBCUTANEOUS
  Administered 2013-09-03 (×2): 1 [IU] via SUBCUTANEOUS
  Administered 2013-09-04: 2 [IU] via SUBCUTANEOUS
  Administered 2013-09-04 – 2013-09-05 (×4): 1 [IU] via SUBCUTANEOUS
  Administered 2013-09-05: 3 [IU] via SUBCUTANEOUS
  Administered 2013-09-06 (×2): 2 [IU] via SUBCUTANEOUS
  Administered 2013-09-06: 1 [IU] via SUBCUTANEOUS
  Administered 2013-09-07 (×3): 2 [IU] via SUBCUTANEOUS

## 2013-08-30 MED ORDER — SODIUM POLYSTYRENE SULFONATE 15 GM/60ML PO SUSP
30.0000 g | Freq: Once | ORAL | Status: AC
Start: 2013-08-30 — End: 2013-08-30
  Administered 2013-08-30: 30 g via ORAL
  Filled 2013-08-30: qty 120

## 2013-08-30 MED ORDER — LEVOTHYROXINE SODIUM 50 MCG PO TABS
50.0000 ug | ORAL_TABLET | Freq: Every day | ORAL | Status: DC
  Administered 2013-08-31 – 2013-09-07 (×8): 50 ug via ORAL
  Filled 2013-08-30 (×9): qty 1

## 2013-08-30 MED ORDER — POLYETHYLENE GLYCOL 3350 17 G PO PACK
17.0000 g | PACK | Freq: Every day | ORAL | Status: DC | PRN
Start: 2013-08-30 — End: 2013-09-07
  Filled 2013-08-30: qty 1

## 2013-08-30 MED ORDER — ONDANSETRON HCL 4 MG PO TABS: 8.0000 mg | ORAL_TABLET | Freq: Three times a day (TID) | ORAL | Status: DC | PRN

## 2013-08-30 MED ORDER — SENNOSIDES-DOCUSATE SODIUM 8.6-50 MG PO TABS: 2.0000 | ORAL_TABLET | ORAL | Status: DC | PRN

## 2013-08-30 MED ORDER — SODIUM CHLORIDE 0.9 % IV SOLN
1000.0000 mL | Freq: Once | INTRAVENOUS | Status: AC
Start: 2013-08-30 — End: 2013-08-30
  Administered 2013-08-30: 1000 mL via INTRAVENOUS

## 2013-08-30 MED ORDER — SIMVASTATIN 20 MG PO TABS
20.0000 mg | ORAL_TABLET | Freq: Every day | ORAL | Status: DC
  Administered 2013-08-30: 20 mg via ORAL
  Filled 2013-08-30: qty 1

## 2013-08-30 MED ORDER — WARFARIN - PHARMACIST DOSING INPATIENT: Freq: Every day | Status: DC

## 2013-08-30 MED ORDER — VANCOMYCIN HCL IN DEXTROSE 1-5 GM/200ML-% IV SOLN
1000.0000 mg | INTRAVENOUS | Status: AC
  Administered 2013-08-30: 1000 mg via INTRAVENOUS
  Filled 2013-08-30: qty 200

## 2013-08-30 MED ORDER — TIOTROPIUM BROMIDE MONOHYDRATE 18 MCG IN CAPS
18.0000 ug | ORAL_CAPSULE | Freq: Every day | RESPIRATORY_TRACT | Status: DC
  Administered 2013-08-31 – 2013-09-04 (×5): 18 ug via RESPIRATORY_TRACT
  Filled 2013-08-30 (×2): qty 5

## 2013-08-30 MED ORDER — SODIUM CHLORIDE 0.9 % IV SOLN
1000.0000 mL | Freq: Once | INTRAVENOUS | Status: AC
  Administered 2013-08-30: 1000 mL via INTRAVENOUS

## 2013-08-30 MED ORDER — DEXAMETHASONE 2 MG PO TABS
2.0000 mg | ORAL_TABLET | Freq: Two times a day (BID) | ORAL | Status: DC
  Administered 2013-08-30 – 2013-09-07 (×17): 2 mg via ORAL
  Filled 2013-08-30 (×21): qty 1

## 2013-08-30 MED ORDER — ALBUTEROL SULFATE (2.5 MG/3ML) 0.083% IN NEBU: 2.5000 mg | INHALATION_SOLUTION | Freq: Four times a day (QID) | RESPIRATORY_TRACT | Status: DC | PRN

## 2013-08-30 MED ORDER — PRAVASTATIN SODIUM 40 MG PO TABS
40.0000 mg | ORAL_TABLET | Freq: Every day | ORAL | Status: DC
  Administered 2013-08-31 – 2013-09-07 (×8): 40 mg via ORAL
  Filled 2013-08-30 (×8): qty 1

## 2013-08-30 MED ORDER — STERILE WATER FOR INJECTION IJ SOLN
INTRAMUSCULAR | Status: AC
  Administered 2013-08-30: 13:00:00
  Filled 2013-08-30: qty 10

## 2013-08-30 MED ORDER — SODIUM CHLORIDE 0.9 % IV SOLN
INTRAVENOUS | Status: DC
  Administered 2013-08-30 – 2013-09-04 (×8): via INTRAVENOUS

## 2013-08-30 MED ORDER — HEPARIN SODIUM (PORCINE) 5000 UNIT/ML IJ SOLN: 5000.0000 [IU] | Freq: Three times a day (TID) | INTRAMUSCULAR | Status: DC

## 2013-08-30 MED ORDER — SODIUM CHLORIDE 0.9 % IV SOLN
250.0000 mg | INTRAVENOUS | Status: AC
  Administered 2013-08-30: 250 mg via INTRAVENOUS
  Filled 2013-08-30: qty 250

## 2013-08-30 MED ORDER — DILTIAZEM HCL ER COATED BEADS 180 MG PO CP24
180.0000 mg | ORAL_CAPSULE | Freq: Every day | ORAL | Status: DC
  Administered 2013-08-31: 180 mg via ORAL
  Filled 2013-08-30: qty 1

## 2013-08-30 MED ORDER — SODIUM CHLORIDE 0.9 % IV SOLN
250.0000 mg | Freq: Two times a day (BID) | INTRAVENOUS | Status: DC
  Administered 2013-08-31 – 2013-09-02 (×5): 250 mg via INTRAVENOUS
  Filled 2013-08-30 (×7): qty 250

## 2013-08-30 MED ORDER — SODIUM CHLORIDE 0.9 % IV SOLN: 1000.0000 mL | INTRAVENOUS | Status: DC

## 2013-08-30 NOTE — ED Notes (Signed)
In to give [place], hospitalist in with patient. Will give as soon as possible.

## 2013-08-30 NOTE — ED Notes (Signed)
Pt states that she has felt weak x 1 wk.  States that she has been having vomiting and diarrhea.  Was sent her by cancer center.  Pt is a poor historian and family is not very helpful as to information.

## 2013-08-30 NOTE — Progress Notes (Signed)
ANTIBIOTIC CONSULT NOTE - INITIAL  Pharmacy Consult for Vancomycin and Primaxin Indication: rule out sepsis  Allergies  Allergen Reactions  . Codeine Nausea Only  . Penicillins     Unknown    Patient Measurements:     Vital Signs: Temp: 98.1 F (36.7 C) (05/20 1200) Temp src: Oral (05/20 1200) BP: 109/49 mmHg (05/20 1200) Pulse Rate: 70 (05/20 1200) Intake/Output from previous day:   Intake/Output from this shift:    Labs:  Recent Labs  08/30/13 1110 08/30/13 1111  WBC 2.0*  --   HGB 10.8*  --   PLT 225  --   CREATININE  --  7.6 Repeated and Verified*   The CrCl is unknown because both a height and weight (above a minimum accepted value) are required for this calculation. No results found for this basename: VANCOTROUGH, Corlis Leak, VANCORANDOM, GENTTROUGH, GENTPEAK, GENTRANDOM, TOBRATROUGH, TOBRAPEAK, TOBRARND, AMIKACINPEAK, AMIKACINTROU, AMIKACIN,  in the last 72 hours   Microbiology: Recent Results (from the past 720 hour(s))  URINE CULTURE     Status: None   Collection Time    08/02/13  9:42 AM      Result Value Ref Range Status   Urine Culture, Routine Culture, Urine   Final   Comment: Final - ===== COLONY COUNT: =====     >=100,000 COLONIES/ML     ENTEROCOCCUS SPECIES          ------------------------------------------------------------------------      ENTEROCOCCUS SPECIES             AMPICILLIN                       MIC      Sensitive        <=2 ug/ml        LEVOFLOXACIN                     MIC      Resistant        >=8 ug/ml        NITROFURANTOIN                   MIC      Sensitive       <=16 ug/ml        VANCOMYCIN                       MIC      Sensitive          1 ug/ml        TETRACYCLINE                     MIC      Resistant       >=16 ug/ml     END OF REPORT    Medical History: Past Medical History  Diagnosis Date  . Sick sinus syndrome   . Hematoma     At the site of the pacemaker insertion.  . Hypotension   . Dyslipidemia   .  History of atrial fibrillation   . Arthritis     Osteoarthritis  . Port-a-cath in place 12/08/10  . Status post chemotherapy completed 03/2011    R - CHOP q 3 weeks x 6  . S/P radiation therapy 05/28/2011 - 06/26/2011    Right Abdomen and Right Pelvis/3060 cGy in 17 Fractions  . Hypertension   . ASHD (arteriosclerotic heart disease)   . Gout   .  COPD (chronic obstructive pulmonary disease)   . Asthma   . Hyperlipidemia   . Diabetes mellitus   . Hypothyroidism   . Diffuse large B cell lymphoma     Dr. Chisom,oncology  . Cancer 10/2010    large B cell lymphoma  . 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 Pacemaker     left chest  . Dysrhythmia     Hx. A. Fib, sick sinus syndrome- Pacemaker inserted.  . Cataracts, bilateral     Medications:   (Not in a hospital admission) Anti-infectives   None     Assessment: 73yo F with lymphoma, admitted from home via the cancer center d/t weakness, vomiting and diarrhea. Code Sepsis was activated and pharmacy is asked to dose Vanc and Primaxin.   Recent weight 90kg  SCr reported as 7.6, result was repeated and verified. CrCl ~7.38ml/min/1.73m2.  Recent enterococcus species in urine culture was sens to Vanc.  Goal of Therapy:  Vancomycin trough level 15-20 mcg/ml Appropriate antibiotic dosing for renal function; eradication of infection  Plan:   Vancomycin 1g IV x 1 dose.  Primaxin 250mg  IV q12h.  Measure Vanc trough at steady state.  Follow up renal fxn and culture results.  Romeo Rabon, PharmD, pager [phone]. 08/30/2013,12:38 PM.

## 2013-08-30 NOTE — Progress Notes (Signed)
Pt can not remember for the past few days. Her son is staying with pt and he is out of town. Niece and neighbors are very concerned about her well being. She is not sure if she has been taking her medications or if she is eating well. Niece is here with pt.

## 2013-08-30 NOTE — Consult Note (Addendum)
Renal Service Consult Note [facility]  [name] 08/30/2013 Sol Blazing Requesting Physician: Dr Milas Hock   Reason for Consult:  Acute on CRF HPI: The patient is a 73 y.o. year-old with hx of afib, lymphoma, HTN, gout, COPD, HL, DM, PPM and CKD. Her baseline creatinine was 1.1-1.5 in 2014 and 1.6-1.9 in March 2015.  She presented to ED today with N/V and diarrhea. Also some altered MS and urinary burning with frequency.  In ED creat is up at 7.5 with BUN 85 and K 5.7.  She denies abd pain, use of nsaids, hematuria, sob or CP.   Denies any joint pain, HA, seizures.   Home meds are allopurinol, lipitor, celexa, decadron, cardizem, synthroid, glucophage, miralax, senokot, spiriva, coumadin. No diuretics noted  Date   Creat   eGFR  CKD 2012   0.9- 1.1    2013   0.9- 1.0 54- 58  Stage III Jan-Jun 2014  1.0- 1.2 July- Dec 2014 1.0- 1.19 Apr 2013  1.1- 1.3 40- 48  Stage III Mar 2015  1.57- 1.92 25- 32  Stage IV Jul 14 2013  1.76 Jul 19 2013  2.10 Jul 24 2013  1.8 Aug 09 2013  2.2  20  Stage IV    Aug 30 2013  7.59  5  Non-Hodgkin's lymphoma of abdomen  12/11/2010 - 03/27/2011  Chemotherapy-- Q 3 week R-CHOP x 6 cyles (rituximab, cytoxan, doxorubicin, vincristine)   05/28/2011 - 06/26/2011  Radiation Therapy-- consolidative XRT [name]). Right abdomen and right pelvis/ 3060 cGy in 17 fractions   06/29/2012 - 08/25/2012  Chemotherapy-- completed 3 cycles of bendamustine/Rituxan on 06/29/2012   10/26/2012 - 03/14/2013  Progression-- did salvage chemotherapy with R-GPD q 3 weeks. Completed 5 cyles (ritiximab, gemcitabine, cisplatin, dexamethasone)   06/05/13  CT C/A/P consistent with moderate disease progression plus R hydronephosis (moderate,severe). Planning R-ICE salvage chemotherapy followed by transplant [name], Dr. Cassell Clement). Referral to Urology (rituximab, ifosfamide, carboplatin, etoposide)   06/26/2013 - 07/03/2013  Hospitalized with AMS, fevers and  dysuria. Treated for urosepsis with broad spectrum antibiotics. Perc nephrostomy placed on right kidney due to hydronephrosis. Discharged to rehab. Seen by Dr [name] >> chemoRx on hold due to infection, plan was to pursue "least aggressive" therapy at that point (Ritxuan/revlimid), and the case was discussed w a physician at [facility] and it was felt that she was not likely to be a SCT candidate at this point   07/14/13  Had Perc nephrostomy removed with placement of right ureteral stent.   Past Medical History  Past Medical History  Diagnosis Date  . Sick sinus syndrome   . Hematoma     At the site of the pacemaker insertion.  . Hypotension   . Dyslipidemia   . History of atrial fibrillation   . Arthritis     Osteoarthritis  . Port-a-cath in place 12/08/10  . Status post chemotherapy completed 03/2011    R - CHOP q 3 weeks x 6  . S/P radiation therapy 05/28/2011 - 06/26/2011    Right Abdomen and Right Pelvis/3060 cGy in 17 Fractions  . Hypertension   . ASHD (arteriosclerotic heart disease)   . Gout   . COPD (chronic obstructive pulmonary disease)   . Asthma   . Hyperlipidemia   . Diabetes mellitus   . Hypothyroidism   . Diffuse large B cell lymphoma     Dr. Chisom,oncology  . Cancer 10/2010    large B cell lymphoma  . Recurrent  lymphoma 06/26/2012  . 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 Pacemaker     left chest  . Dysrhythmia     Hx. A. Fib, sick sinus syndrome- Pacemaker inserted.  . Cataracts, bilateral    Past Surgical History  Past Surgical History  Procedure Laterality Date  . Pacemaker insertion      Lead revision completed August 04, 2006- left chest G. Lovena Le  . Tubal ligation      Bilateral  . Insert / replace / remove pacemaker    . Abdominal hysterectomy    . Biopsy stomach  11/03/10    Soft Tissue Mass, Biopsy, Right Lower Quadrant Mesenteric Mass- High Grade Non-Hodgkins B Cell Lymphoma  with Flow Cytometry  . Bone biopsy  11/24/10    Bone Marrow,  Aspirate Biospy, and clot, Left - No Involvement of Non-Hodgkin's Lymphoma Identified  . Portacath placement Right 06-23-13  . Cholecystectomy      laparoscopic  . Bladder suspension     Family History  Family History  Problem Relation Age of Onset  . Stroke Mother   . Stroke Father   . Aneurysm Son     Brain  . Cancer Sister      1 sister had vaginal cancer  . Cancer Brother     Prostate   Social History  reports that she quit smoking about 2 years ago. Her smoking use included Cigarettes. She has a 45 pack-year smoking history. She has never used smokeless tobacco. She reports that she does not drink alcohol or use illicit drugs. Allergies  Allergies  Allergen Reactions  . Codeine Nausea Only  . Penicillins     Unknown   Home medications Prior to Admission medications   Medication Sig Start Date End Date Taking? Authorizing Provider  albuterol (PROVENTIL) (2.5 MG/3ML) 0.083% nebulizer solution Take 3 mLs (2.5 mg total) by nebulization every 6 (six) hours as needed for shortness of breath. 04/14/13  Yes Melissa R Smith, PA-C  allopurinol (ZYLOPRIM) 300 MG tablet TAKE 1 TABLET BY MOUTH DAILY   Yes Vicie Mutters, PA-C  atorvastatin (LIPITOR) 10 MG tablet Take 10 mg by mouth daily at 6 PM.   Yes Historical Provider, MD  Cholecalciferol (VITAMIN D3) 2000 UNITS TABS Take 4,000 Units by mouth 2 (two) times daily.    Yes Historical Provider, MD  citalopram (CELEXA) 20 MG tablet TAKE 1 TABLET BY MOUTH DAILY FOR MOOD   Yes Vicie Mutters, PA-C  dexamethasone (DECADRON) 2 MG tablet Take 1 tablet (2 mg total) by mouth 2 (two) times daily with a meal. 08/29/13  Yes Renato Shin, MD  diltiazem (CARDIZEM CD) 180 MG 24 hr capsule TAKE 1 CAPSULE BY MOUTH DAILY   Yes Vicie Mutters, PA-C  guaiFENesin (MUCINEX) 600 MG 12 hr tablet Take 600 mg by mouth 2 (two) times daily.   Yes Historical Provider, MD  lenalidomide (REVLIMID) 15 MG capsule Take 1 capsule (15 mg total) by mouth every other day.  08/02/13  Yes Concha Norway, MD  levothyroxine (SYNTHROID, LEVOTHROID) 50 MCG tablet Take 50 mcg by mouth daily before breakfast.   Yes Historical Provider, MD  magnesium oxide (MAG-OX) 400 (241.3 MG) MG tablet Take 0.5 tablets (200 mg total) by mouth daily. 02/21/13  Yes Allie Bossier, MD  metFORMIN (GLUCOPHAGE-XR) 500 MG 24 hr tablet TAKE 2 TABLETS BY MOUTH TWICE DAILY   Yes Vicie Mutters, PA-C  ondansetron (ZOFRAN) 8 MG tablet TAKE 1 TABLET BY MOUTH EVERY 12 HOURS AS NEEDED FOR  NAUSEA 08/18/13  Yes Concha Norway, MD  polyethylene glycol (MIRALAX / GLYCOLAX) packet Take 17 g by mouth 2 (two) times daily. 07/23/13  Yes Gerlene Fee, NP  potassium chloride SA (K-DUR,KLOR-CON) 10 MEQ tablet Take 1 tablet (10 mEq total) by mouth 2 (two) times daily. 08/22/13  Yes Concha Norway, MD  pravastatin (PRAVACHOL) 40 MG tablet TAKE 1 TABLET BY MOUTH EVERY NIGHT AT BEDTIME FOR CHOLESTEROL   Yes Vicie Mutters, PA-C  sennosides-docusate sodium (SENOKOT-S) 8.6-50 MG tablet Take 2 tablets by mouth 2 (two) times daily.    Yes Historical Provider, MD  tiotropium (SPIRIVA) 18 MCG inhalation capsule Place 1 capsule (18 mcg total) into inhaler and inhale daily. 08/22/13  Yes Concha Norway, MD  URELLE (URELLE/URISED) 81 MG TABS tablet Take 1 tablet (81 mg total) by mouth 3 (three) times daily as needed for bladder spasms. 07/03/13  Yes Modena Jansky, MD  warfarin (COUMADIN) 6 MG tablet Take 3 mg by mouth daily. 1/2 tablet daily   Yes Historical Provider, MD  lidocaine-prilocaine (EMLA) cream Apply topically as needed. Apply to port a cath site one hour before needle stick as needed. 06/12/13   Concha Norway, MD   Liver Function Tests  Recent Labs Lab 08/30/13 1111 08/30/13 1210  AST 24 24  ALT 19 17  ALKPHOS 71 68  BILITOT 0.36 0.2*  PROT 6.6 6.6  ALBUMIN 3.4* 3.5    Recent Labs Lab 08/30/13 1210  LIPASE 72*   CBC  Recent Labs Lab 08/30/13 1110 08/30/13 1210  WBC 2.0* 1.8*  NEUTROABS 0.2 Repeated and  Verified* 0.1*  HGB 10.8* 10.3*  HCT 34.1* 31.0*  MCV 86.7 83.3  PLT 225 902   Basic Metabolic Panel  Recent Labs Lab 08/30/13 1111 08/30/13 1210  NA 132* 130*  K 5.5* 5.7*  CL  --  92*  CO2 14* 14*  GLUCOSE 103 91  BUN 86.3* 85*  CREATININE 7.6 Repeated and Verified* 7.59*  CALCIUM 11.7* 11.5*    Filed Vitals:   08/30/13 1530 08/30/13 1545 08/30/13 1600 08/30/13 1633  BP: 108/60 101/39 98/57 93/37   Pulse: 71 69 71   Temp:      TempSrc:      Resp: 14 19 20    SpO2: 98% 99% 97%    Exam Elderly obese, lethargic, arouses , confused No jvd Chest clear bilat RRR no MRG Abd very obese, soft, dec'd BS, nontender, no hsm Ext no LE edema, no ulcer or gangrene Neuro is nf, mild gen weakness, no asterixis  UA 100 protein, 21-50wbc, 11-20 rbc, many bact, mod LE, nitrite neg CXR mild CM, clear lungs Renal US - R ureteral stent in place, no hydro, 9.7/9.9 cm kidneys  Date   Creat   eGFR  CKD 2012   0.9- 1.1    2013   0.9- 1.0 54- 58  Stage III Jan-Jun 2014  1.0- 1.2 July- Dec 2014 1.0- 1.19 Apr 2013  1.1- 1.3 40- 48  Stage III Mar 2015  1.57- 1.92 25- 32  Stage IV Jul 14 2013  1.76 Jul 19 2013  2.10 Jul 24 2013  1.8 Aug 09 2013  2.2  20  Stage IV    Aug 30 2013  7.59  5   Assessment: 1 Acute on CRF- in patient with lymphoma since 2012, CKD developing over the past year, and acute GI illness w vol depletion and hypotension. Recommend IVF"s and supportive care for now.  2 CKD stage IV- has  been losing renal function over the past year or so with eGFR of 20 mL/min in April. Did receive cisplatin as part of her second round of chemotherapy during the second half of 2014.   3 Vol depletion 4 Hypotension 5 Lymphoma f/b oncology 6 Hx afib- on po diltiazem 10 Hx R ureteral stent- due to ureteral obstruction from lymphoma; stent is in place by [place] and no hydro  Plan- as above. Will follow.   Kelly Splinter MD (pgr) [phone]    ([phone] 08/30/2013, 4:39 PM

## 2013-08-30 NOTE — ED Notes (Signed)
Niece Dorris Education officer, community) states that as far as she knows patient is Full Code at this time. Has HCPOA papers with her.

## 2013-08-30 NOTE — ED Notes (Signed)
Pt unable to use bedpan at this moment for urine specimen

## 2013-08-30 NOTE — ED Notes (Signed)
Dorris Alexander (patient's niece/ POA) [phone]

## 2013-08-30 NOTE — ED Notes (Signed)
Attempted calling report to 2W, RN will call back promptly.

## 2013-08-30 NOTE — ED Provider Notes (Signed)
CSN: [id number]     Arrival date & time 08/30/13  1157 History   First MD Initiated Contact with Patient 08/30/13 1209     Chief Complaint  Patient presents with  . Nausea  . Emesis  . Diarrhea     (Consider location/radiation/quality/duration/timing/severity/associated sxs/prior Treatment) HPI Comments: Patient sent to the emergency department from the oncology clinic. Patient is currently undergoing treatment for B-cell lymphoma. She has had progressive decline in mental status, confusion, generalized weakness for a period of one week. She reports nausea and vomiting. She has reported urinary frequency and burning with urination as well. She was diagnosed with a urinary tract infection in the oncology clinic 5 days ago. It's not clear what antibiotic she was started on, family reports that she has not been taking her medications as she should.   She presented to the oncology clinic today with these complaints and was felt to have evidence of possible early urosepsis, was referred to the ER for further management and admission. Patient has a ureteral stent in place, she has had hydronephrosis secondary to her lymphoma. Patient was reportedly hypotensive in the clinic.  Patient does not answer questions appropriately. She is acutely confused. Level V Caveat due to confusion.  Patient is a 73 y.o. female presenting with vomiting and diarrhea.  Emesis Associated symptoms: diarrhea   Diarrhea Associated symptoms: vomiting     Past Medical History  Diagnosis Date  . Sick sinus syndrome   . Hematoma     At the site of the pacemaker insertion.  . Hypotension   . Dyslipidemia   . History of atrial fibrillation   . Arthritis     Osteoarthritis  . Port-a-cath in place 12/08/10  . Status post chemotherapy completed 03/2011    R - [facility] q 3 weeks x 6  . S/P radiation therapy 05/28/2011 - 06/26/2011    Right Abdomen and Right Pelvis/3060 cGy in 17 Fractions  . Hypertension   . ASHD  (arteriosclerotic heart disease)   . Gout   . COPD (chronic obstructive pulmonary disease)   . Asthma   . Hyperlipidemia   . Diabetes mellitus   . Hypothyroidism   . Diffuse large B cell lymphoma     Dr. Chisom,oncology  . Cancer 10/2010    large B cell lymphoma  . 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 Pacemaker     left chest  . Dysrhythmia     Hx. A. Fib, sick sinus syndrome- Pacemaker inserted.  . Cataracts, bilateral    Past Surgical History  Procedure Laterality Date  . Pacemaker insertion      Lead revision completed August 04, 2006- left chest G. Lovena Le  . Tubal ligation      Bilateral  . Insert / replace / remove pacemaker    . Abdominal hysterectomy    . Biopsy stomach  11/03/10    Soft Tissue Mass, Biopsy, Right Lower Quadrant Mesenteric Mass- High Grade Non-Hodgkins B Cell Lymphoma  with Flow Cytometry  . Bone biopsy  11/24/10    Bone Marrow, Aspirate Biospy, and clot, Left - No Involvement of Non-Hodgkin's Lymphoma Identified  . Portacath placement Right 06-23-13  . Cholecystectomy      laparoscopic  . Bladder suspension     Family History  Problem Relation Age of Onset  . Stroke Mother   . Stroke Father   . Aneurysm Son     Brain  . Cancer Sister  1 sister had vaginal cancer  . Cancer Brother     Prostate   History  Substance Use Topics  . Smoking status: Former Smoker -- 1.50 packs/day for 30 years    Types: Cigarettes    Quit date: 03/14/2011  . Smokeless tobacco: Never Used  . Alcohol Use: No   OB History   Grav Para Term Preterm Abortions TAB SAB Ect Mult Living                 Review of Systems  Unable to perform ROS: Mental status change  Gastrointestinal: Positive for vomiting and diarrhea.      Allergies  Codeine and Penicillins  Home Medications   Prior to Admission medications   Medication Sig Start Date End Date Taking? Authorizing Provider  albuterol (PROVENTIL) (2.5 MG/3ML)  0.083% nebulizer solution Take 3 mLs (2.5 mg total) by nebulization every 6 (six) hours as needed for shortness of breath. 04/14/13  Yes Melissa R Smith, PA-C  allopurinol (ZYLOPRIM) 300 MG tablet TAKE 1 TABLET BY MOUTH DAILY    Vicie Mutters, PA-C  atorvastatin (LIPITOR) 10 MG tablet Take 10 mg by mouth daily at 6 PM.    Historical Provider, MD  Cholecalciferol (VITAMIN D3) 2000 UNITS TABS Take 4,000 Units by mouth 2 (two) times daily.     Historical Provider, MD  citalopram (CELEXA) 20 MG tablet TAKE 1 TABLET BY MOUTH DAILY FOR MOOD    Vicie Mutters, PA-C  dexamethasone (DECADRON) 2 MG tablet Take 1 tablet (2 mg total) by mouth 2 (two) times daily with a meal. 08/29/13   Renato Shin, MD  diltiazem (CARDIZEM CD) 180 MG 24 hr capsule TAKE 1 CAPSULE BY MOUTH DAILY    Vicie Mutters, PA-C  glycerin, Pediatric, 1.2 G SUPP Place 1 suppository rectally daily as needed for moderate constipation.    Historical Provider, MD  guaiFENesin (MUCINEX) 600 MG 12 hr tablet Take 600 mg by mouth 2 (two) times daily.    Historical Provider, MD  lenalidomide (REVLIMID) 15 MG capsule Take 1 capsule (15 mg total) by mouth every other day. 08/02/13   Concha Norway, MD  levothyroxine (SYNTHROID, LEVOTHROID) 50 MCG tablet Take 50 mcg by mouth daily before breakfast.    Historical Provider, MD  lidocaine-prilocaine (EMLA) cream Apply topically as needed. Apply to port a cath site one hour before needle stick as needed. 06/12/13   Concha Norway, MD  magnesium oxide (MAG-OX) 400 (241.3 MG) MG tablet Take 0.5 tablets (200 mg total) by mouth daily. 02/21/13   Allie Bossier, MD  metFORMIN (GLUCOPHAGE-XR) 500 MG 24 hr tablet TAKE 2 TABLETS BY MOUTH TWICE DAILY    Vicie Mutters, PA-C  ondansetron (ZOFRAN) 8 MG tablet TAKE 1 TABLET BY MOUTH EVERY 12 HOURS AS NEEDED FOR NAUSEA 08/18/13   Concha Norway, MD  polyethylene glycol (MIRALAX / GLYCOLAX) packet Take 17 g by mouth 2 (two) times daily. 07/23/13   Gerlene Fee, NP  potassium chloride  SA (K-DUR,KLOR-CON) 10 MEQ tablet Take 1 tablet (10 mEq total) by mouth 2 (two) times daily. 08/22/13   Concha Norway, MD  pravastatin (PRAVACHOL) 40 MG tablet TAKE 1 TABLET BY MOUTH EVERY NIGHT AT BEDTIME FOR CHOLESTEROL    Vicie Mutters, PA-C  prochlorperazine (COMPAZINE) 10 MG tablet TAKE 1 TABLET BY MOUTH EVERY 6 HOURS AS NEEDED FOR NAUSEA 08/18/13   Concha Norway, MD  sennosides-docusate sodium (SENOKOT-S) 8.6-50 MG tablet Take 2 tablets by mouth 2 (two) times daily.  Historical Provider, MD  sulfamethoxazole-trimethoprim (BACTRIM DS,SEPTRA DS) 800-160 MG per tablet Take 1 tablet by mouth once. 08/25/13   Concha Norway, MD  tiotropium (SPIRIVA) 18 MCG inhalation capsule Place 1 capsule (18 mcg total) into inhaler and inhale daily. 08/22/13   Concha Norway, MD  URELLE (URELLE/URISED) 81 MG TABS tablet Take 1 tablet (81 mg total) by mouth 3 (three) times daily as needed for bladder spasms. 07/03/13   Modena Jansky, MD  warfarin (COUMADIN) 5 MG tablet Take 5 mg by mouth daily at 6 PM.  07/03/13   Modena Jansky, MD  warfarin (COUMADIN) 6 MG tablet TAKE 1 TABLET BY MOUTH DAILY OR AS DIRECTED    Vicie Mutters, PA-C   BP 98/64  Pulse 69  Temp(Src) 97.8 F (36.6 C) (Oral)  Resp 16  SpO2 100% Physical Exam  Constitutional: She appears well-developed and well-nourished. No distress.  HENT:  Head: Normocephalic and atraumatic.  Right Ear: Hearing normal.  Left Ear: Hearing normal.  Nose: Nose normal.  Mouth/Throat: Oropharynx is clear and moist and mucous membranes are normal.  Eyes: Conjunctivae and EOM are normal. Pupils are equal, round, and reactive to light.  Neck: Normal range of motion. Neck supple.  Cardiovascular: Regular rhythm, S1 normal and S2 normal.  Exam reveals no gallop and no friction rub.   No murmur heard. Pulmonary/Chest: Effort normal and breath sounds normal. No respiratory distress. She exhibits no tenderness.  Abdominal: Soft. Normal appearance and bowel sounds are normal.  There is no hepatosplenomegaly. There is no tenderness. There is no rebound, no guarding, no tenderness at McBurney's point and negative Murphy's sign. No hernia.  Musculoskeletal: Normal range of motion.  Neurological: She is alert. She has normal strength. No cranial nerve deficit or sensory deficit. Coordination normal. GCS eye subscore is 4. GCS verbal subscore is 5. GCS motor subscore is 5.  Skin: Skin is warm, dry and intact. No rash noted. No cyanosis.  Psychiatric: She has a normal mood and affect. Her speech is normal and behavior is normal. Thought content normal.    ED Course  Procedures (including critical care time) Labs Review Labs Reviewed  CBC WITH DIFFERENTIAL - Abnormal; Notable for the following:    WBC 1.8 (*)    RBC 3.72 (*)    Hemoglobin 10.3 (*)    HCT 31.0 (*)    RDW 16.3 (*)    Neutrophils Relative % 3 (*)    Lymphocytes Relative 69 (*)    Monocytes Relative 23 (*)    Basophils Relative 2 (*)    Neutro Abs 0.1 (*)    All other components within normal limits  COMPREHENSIVE METABOLIC PANEL - Abnormal; Notable for the following:    Sodium 130 (*)    Potassium 5.7 (*)    Chloride 92 (*)    CO2 14 (*)    BUN 85 (*)    Creatinine, Ser 7.59 (*)    Calcium 11.5 (*)    Total Bilirubin 0.2 (*)    GFR calc non Af Amer 5 (*)    GFR calc Af Amer 5 (*)    All other components within normal limits  LIPASE, BLOOD - Abnormal; Notable for the following:    Lipase 72 (*)    All other components within normal limits  URINALYSIS, ROUTINE W REFLEX MICROSCOPIC - Abnormal; Notable for the following:    APPearance CLOUDY (*)    Hgb urine dipstick MODERATE (*)    Protein, ur 100 (*)  Leukocytes, UA MODERATE (*)    All other components within normal limits  URINE MICROSCOPIC-ADD ON - Abnormal; Notable for the following:    Squamous Epithelial / LPF FEW (*)    Bacteria, UA MANY (*)    All other components within normal limits  I-STAT CG4 LACTIC ACID, ED - Abnormal;  Notable for the following:    Lactic Acid, Venous 2.30 (*)    All other components within normal limits  CULTURE, BLOOD (ROUTINE X 2)  CULTURE, BLOOD (ROUTINE X 2)  URINE CULTURE    Imaging Review Dg Chest Port 1 View  08/30/2013   CLINICAL DATA:  Nausea, emesis, diarrhea.  History of COPD.  EXAM: PORTABLE CHEST - 1 VIEW  COMPARISON:  Chest radiograph 06/26/2013 and CT chest abdomen pelvis 06/05/2013  FINDINGS: Right subclavian power port with the distal tip projecting over the superior vena cava. Left chest wall dual lead pacemaker appears stable. Cardiac leads project over the chest. Cardiomegaly appears similar to recent prior chest radiograph. Pulmonary vascularity is upper normal. The lungs appear clear. No airspace disease, effusion, or pneumothorax identified.  IMPRESSION: Mild cardiomegaly. Pulmonary vascularity is upper normal and the lungs appear clear.   Electronically Signed   By: Curlene Dolphin M.D.   On: 08/30/2013 14:43     EKG Interpretation None      MDM   Final diagnoses:  Acute renal failure  Hypercalcemia  UTI (lower urinary tract infection)  Sepsis    Patient referred to the ER from oncology clinic for evaluation of possible urosepsis. Patient has a very complicated past medical history. She is currently being treated for lymphoma which is widespread. She has had a right ureteral stent placed because of obstruction secondary to lymphoma. She has had symptoms of urinary frequency and dysuria over the past several days, started on antibiotics as an outpatient. Urinalysis is abnormal, this is clearly infection. This is concerning for early sepsis secondary to urinary source. This is particularly concerning as the patient is neutropenic.  Lateral so shows mild hyperkalemia (treated with Kayexalate) and acute renal failure. She has a baseline insufficiency a creatinine around 2, creatinine today is 7.6. This was discussed with Doctor Jonnie Finner, on call for nephrology. He'll  consult on the patient. He did not recommend transfer to [facility], no impending dialysis necessary at this time. I did perform renal ultrasound to ensure that there was not significant obstruction, there is no hydronephrosis noted on the scan.  Patient was treated empirically with antibiotic coverage for sepsis from urinary source upon arrival. This was dosed by pharmacy secondary to acute renal failure. Patient will record her hospitalization to the step down unit for further management.  CRITICAL CARE Performed by: Orpah Greek   Total critical care time: 85min  Critical care time was exclusive of separately billable procedures and treating other patients.  Critical care was necessary to treat or prevent imminent or life-threatening deterioration.  Critical care was time spent personally by me on the following activities: development of treatment plan with patient and/or surrogate as well as nursing, discussions with consultants, evaluation of patient's response to treatment, examination of patient, obtaining history from patient or surrogate, ordering and performing treatments and interventions, ordering and review of laboratory studies, ordering and review of radiographic studies, pulse oximetry and re-evaluation of patient's condition.    Orpah Greek, MD 08/30/13 [phone]

## 2013-08-30 NOTE — H&P (Signed)
History and Physical     [name] YQM:[id number] DOB: 1940-06-22 DOA: 08/30/2013  Referring physician: Dr. Betsey Holiday PCP: [name], MD  Specialists: none   Chief Complaint: weakness, dysuria for a week  HPI: [name] is a 73 y.o. female has a past medical history significant for sick sinus sd s/p pacemaker, A fib, DBCL currently undergoing chemotherapy followed by Dr. [name], presents to the ED with a chief complaint of weakness and dysuria. Patient recently had a hospitalization March 2015 with severe sepsis due to urinary infection c/b obstructive R hydronephrosis due to her NHL s/p percutaneous nephrostomy per IR, followed by IR and Urology, now with tube removed and has an internal stent placed ~3 weeks ago per patient's niece. Patient is currently on Revlimid and it appears that she is taking her medication inconsistently; she also has missed an appointment in cancer center for chemotherapy (Rituxan) few weeks back. It appears that her care is directed mostly by her son and he himself is extremely busy and patient is by herself 2-3 days per week.  Her niece who is HPOA (along with patient's son) is in the ED and tells me that patient has had intermittent confusion in the past few days up to the past week. There are no reported fevers or chills. There is no reported chest pain, breathing difficulties, vomiting or diarrhea. She reported nausea.  In the ED, BMP remarkable for renal failure with a Cr of 7.6, K 5.7, elevated lactic acid; she had a renal US which was negative for hydronephrosis.   Review of Systems: unable to obtain reliable ROS due to patient's confusion.    Past Medical History  Diagnosis Date  . Sick sinus syndrome   . Hematoma     At the site of the pacemaker insertion.  . Hypotension   . Dyslipidemia   . History of atrial fibrillation   . Arthritis     Osteoarthritis  . Port-a-cath in place 12/08/10  . Status post chemotherapy completed  03/2011    R - [facility]OP q 3 weeks x 6  . S/P radiation therapy 05/28/2011 - 06/26/2011    Right Abdomen and Right Pelvis/3060 cGy in 17 Fractions  . Hypertension   . ASHD (arteriosclerotic heart disease)   . Gout   . COPD (chronic obstructive pulmonary disease)   . Asthma   . Hyperlipidemia   . Diabetes mellitus   . Hypothyroidism   . Diffuse large B cell lymphoma     Dr. Chisom,oncology  . Cancer 10/2010    large B cell lymphoma  . 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 Pacemaker     left chest  . Dysrhythmia     Hx. A. Fib, sick sinus syndrome- Pacemaker inserted.  . Cataracts, bilateral    Past Surgical History  Procedure Laterality Date  . Pacemaker insertion      Lead revision completed August 04, 2006- left chest G. Lovena Le  . Tubal ligation      Bilateral  . Insert / replace / remove pacemaker    . Abdominal hysterectomy    . Biopsy stomach  11/03/10    Soft Tissue Mass, Biopsy, Right Lower Quadrant Mesenteric Mass- High Grade Non-Hodgkins B Cell Lymphoma  with Flow Cytometry  . Bone biopsy  11/24/10    Bone Marrow, Aspirate Biospy, and clot, Left - No Involvement of Non-Hodgkin's Lymphoma Identified  . Portacath placement Right 06-23-13  . Cholecystectomy  laparoscopic  . Bladder suspension     Social History:  reports that she quit smoking about 2 years ago. Her smoking use included Cigarettes. She has a 45 pack-year smoking history. She has never used smokeless tobacco. She reports that she does not drink alcohol or use illicit drugs.  Allergies  Allergen Reactions  . Codeine Nausea Only  . Penicillins     Unknown   Family History  Problem Relation Age of Onset  . Stroke Mother   . Stroke Father   . Aneurysm Son     Brain  . Cancer Sister      1 sister had vaginal cancer  . Cancer Brother     Prostate   Prior to Admission medications   Medication Sig Start Date End Date Taking? Authorizing Provider  albuterol  (PROVENTIL) (2.5 MG/3ML) 0.083% nebulizer solution Take 3 mLs (2.5 mg total) by nebulization every 6 (six) hours as needed for shortness of breath. 04/14/13  Yes Melissa R Smith, PA-C  allopurinol (ZYLOPRIM) 300 MG tablet TAKE 1 TABLET BY MOUTH DAILY   Yes Vicie Mutters, PA-C  atorvastatin (LIPITOR) 10 MG tablet Take 10 mg by mouth daily at 6 PM.   Yes Historical Provider, MD  Cholecalciferol (VITAMIN D3) 2000 UNITS TABS Take 4,000 Units by mouth 2 (two) times daily.    Yes Historical Provider, MD  citalopram (CELEXA) 20 MG tablet TAKE 1 TABLET BY MOUTH DAILY FOR MOOD   Yes Vicie Mutters, PA-C  dexamethasone (DECADRON) 2 MG tablet Take 1 tablet (2 mg total) by mouth 2 (two) times daily with a meal. 08/29/13  Yes Renato Shin, MD  diltiazem (CARDIZEM CD) 180 MG 24 hr capsule TAKE 1 CAPSULE BY MOUTH DAILY   Yes Vicie Mutters, PA-C  guaiFENesin (MUCINEX) 600 MG 12 hr tablet Take 600 mg by mouth 2 (two) times daily.   Yes Historical Provider, MD  lenalidomide (REVLIMID) 15 MG capsule Take 1 capsule (15 mg total) by mouth every other day. 08/02/13  Yes Concha Norway, MD  levothyroxine (SYNTHROID, LEVOTHROID) 50 MCG tablet Take 50 mcg by mouth daily before breakfast.   Yes Historical Provider, MD  magnesium oxide (MAG-OX) 400 (241.3 MG) MG tablet Take 0.5 tablets (200 mg total) by mouth daily. 02/21/13  Yes Allie Bossier, MD  metFORMIN (GLUCOPHAGE-XR) 500 MG 24 hr tablet TAKE 2 TABLETS BY MOUTH TWICE DAILY   Yes Vicie Mutters, PA-C  ondansetron (ZOFRAN) 8 MG tablet TAKE 1 TABLET BY MOUTH EVERY 12 HOURS AS NEEDED FOR NAUSEA 08/18/13  Yes Concha Norway, MD  polyethylene glycol (MIRALAX / GLYCOLAX) packet Take 17 g by mouth 2 (two) times daily. 07/23/13  Yes Gerlene Fee, NP  potassium chloride SA (K-DUR,KLOR-CON) 10 MEQ tablet Take 1 tablet (10 mEq total) by mouth 2 (two) times daily. 08/22/13  Yes Concha Norway, MD  pravastatin (PRAVACHOL) 40 MG tablet TAKE 1 TABLET BY MOUTH EVERY NIGHT AT BEDTIME FOR CHOLESTEROL    Yes Vicie Mutters, PA-C  sennosides-docusate sodium (SENOKOT-S) 8.6-50 MG tablet Take 2 tablets by mouth 2 (two) times daily.    Yes Historical Provider, MD  tiotropium (SPIRIVA) 18 MCG inhalation capsule Place 1 capsule (18 mcg total) into inhaler and inhale daily. 08/22/13  Yes Concha Norway, MD  URELLE (URELLE/URISED) 81 MG TABS tablet Take 1 tablet (81 mg total) by mouth 3 (three) times daily as needed for bladder spasms. 07/03/13  Yes Modena Jansky, MD  warfarin (COUMADIN) 6 MG tablet Take 3 mg by  mouth daily. 1/2 tablet daily   Yes Historical Provider, MD  lidocaine-prilocaine (EMLA) cream Apply topically as needed. Apply to port a cath site one hour before needle stick as needed. 06/12/13   Concha Norway, MD   Physical Exam: Filed Vitals:   08/30/13 1415 08/30/13 1416 08/30/13 1420 08/30/13 1519  BP: 94/70 94/70 98/64  107/56  Pulse: 70  69   Temp:      TempSrc:    Other (Comment)  Resp: 15 15 16 20   SpO2: 98% 99% 100% 98%    General: obese caucasian female in no distress  Eyes: no scleral icterus  ENT: moist oropharynx  Neck: supple, no JVD  Cardiovascular: regular rate without MRG; 2+ peripheral pulses  Respiratory: good air movement, clear  Abdomen: soft, non tender to palpation, positive bowel sounds, no guarding, no rebound  Skin: no rashes  Musculoskeletal: no peripheral edema  Psychiatric: normal mood and affect  Neurologic: non focal   Labs on Admission:  Basic Metabolic Panel:  Recent Labs Lab 08/30/13 1111 08/30/13 1210  NA 132* 130*  K 5.5* 5.7*  CL  --  92*  CO2 14* 14*  GLUCOSE 103 91  BUN 86.3* 85*  CREATININE 7.6 Repeated and Verified* 7.59*  CALCIUM 11.7* 11.5*   Liver Function Tests:  Recent Labs Lab 08/30/13 1111 08/30/13 1210  AST 24 24  ALT 19 17  ALKPHOS 71 68  BILITOT 0.36 0.2*  PROT 6.6 6.6  ALBUMIN 3.4* 3.5    Recent Labs Lab 08/30/13 1210  LIPASE 72*   CBC:  Recent Labs Lab 08/30/13 1110 08/30/13 1210  WBC  2.0* 1.8*  NEUTROABS 0.2 Repeated and Verified* 0.1*  HGB 10.8* 10.3*  HCT 34.1* 31.0*  MCV 86.7 83.3  PLT 225 188   Radiological Exams on Admission: US Renal  08/30/2013   CLINICAL DATA:  Flank pain.  EXAM: RENAL/URINARY TRACT ULTRASOUND COMPLETE  COMPARISON:  CT 06/05/2013.  FINDINGS: Right Kidney:  Length: 9.7 cm. Ureteral stent noted. Echogenicity within normal limits. No mass or hydronephrosis visualized.  Left Kidney:  Length: 9.9 cm. Echogenicity within normal limits. No mass or hydronephrosis visualized.  Bladder:  Bladder is nondistended. Bladder wall is thickened to approximately 9 mm.  IMPRESSION: 1. Right ureteral stent noted.  No hydronephrosis. 2. Bladder nondistended.  Bladder wall thickening is present.   Electronically Signed   By: [place]   On: 08/30/2013 15:03   Dg Chest Port 1 View  08/30/2013   CLINICAL DATA:  Nausea, emesis, diarrhea.  History of COPD.  EXAM: PORTABLE CHEST - 1 VIEW  COMPARISON:  Chest radiograph 06/26/2013 and CT chest abdomen pelvis 06/05/2013  FINDINGS: Right subclavian power port with the distal tip projecting over the superior vena cava. Left chest wall dual lead pacemaker appears stable. Cardiac leads project over the chest. Cardiomegaly appears similar to recent prior chest radiograph. Pulmonary vascularity is upper normal. The lungs appear clear. No airspace disease, effusion, or pneumothorax identified.  IMPRESSION: Mild cardiomegaly. Pulmonary vascularity is upper normal and the lungs appear clear.   Electronically Signed   By: Curlene Dolphin M.D.   On: 08/30/2013 14:43    EKG: Independently reviewed. Paced rhythm  Assessment/Plan Active Problems:   Cardiac pacemaker in situ   Diffuse large B cell lymphoma   Hypothyroidism   Diabetes mellitus type 2 in obese   Hyperlipidemia   Antineoplastic chemotherapy induced pancytopenia   Acute renal failure   COPD (chronic obstructive pulmonary disease)   UTI (  lower urinary tract infection)    Severe sepsis   Sepsis secondary to UTI   Generalized weakness   Acute on chronic renal failure   CKD (chronic kidney disease) stage 3, GFR 30-59 ml/min   Acute encephalopathy   Neutropenia   Severe sepsis with evidence of renal failure and lactic acidosis  - likely in the setting of urinary infection. Renal US without hydronephrosis which is reassuring appearing that the stent is not occluded. - admit to SDU, IVF, Vancomycin and Primaxin  - blood cultures pending  UTI - urine cultures sent, continue antibiotics as above  Recurrent diffuse B cell lymphoma - followed as an outpatient.   Acute renal failure on CKD stage III - multifactorial, ?chemo induced, mild dehydration, Urinary infection  - no hydronephrosis on [place] - nephrology consulted, appreciate input.  - gentle hydration, monitor UOP - avoid nephrotoxic agents  A fib with RVR - not in A fib currently, paced. Continue Coumadin and Cardizem  COPD - no wheezing or evidence of decompensation. Continue home medications  Hyperkalemia - due to renal failure. Kayexalate.   DM - stop metformin, SSI. Check A1C  Neutropenia - chemotherapy induced. Monitor.   Anemia - multifactorial, of chronic disease, on Revlemid. No evidence of bleeding, monitor.   Supratherapeutc INR - CO[facility]adin er pharmacy.   Acute encephalopathy - in the setting of #1, 2, 4  SSS s/p PCM  Diet: heart Fluids: NS DVT Prophylaxis: On Coumadin  Code Status: Full  Family Communication: Dorris Alexander (patient's niece/ POA) [phone]  Disposition Plan: admit to SDU  Time spent: 90  This note has been created with Surveyor, quantity. Any transcriptional errors are unintentional.   Costin M. Cruzita Lederer, MD [facility] Pager [phone]  If 7PM-7AM, please contact night-coverage [email or web address] Password [facility] 08/30/2013, 3:28 PM

## 2013-08-30 NOTE — ED Notes (Signed)
MD Pollina wants to stop both boluses at this time due to Cr 7.59. Boluses stopped.

## 2013-08-31 DIAGNOSIS — D709 Neutropenia, unspecified: Secondary | ICD-10-CM

## 2013-08-31 DIAGNOSIS — C8583 Other specified types of non-Hodgkin lymphoma, intra-abdominal lymph nodes: Secondary | ICD-10-CM

## 2013-08-31 DIAGNOSIS — N179 Acute kidney failure, unspecified: Secondary | ICD-10-CM

## 2013-08-31 DIAGNOSIS — I4891 Unspecified atrial fibrillation: Secondary | ICD-10-CM

## 2013-08-31 DIAGNOSIS — J449 Chronic obstructive pulmonary disease, unspecified: Secondary | ICD-10-CM

## 2013-08-31 DIAGNOSIS — T451X5A Adverse effect of antineoplastic and immunosuppressive drugs, initial encounter: Secondary | ICD-10-CM

## 2013-08-31 DIAGNOSIS — I10 Essential (primary) hypertension: Secondary | ICD-10-CM

## 2013-08-31 DIAGNOSIS — D6481 Anemia due to antineoplastic chemotherapy: Secondary | ICD-10-CM

## 2013-08-31 DIAGNOSIS — E785 Hyperlipidemia, unspecified: Secondary | ICD-10-CM

## 2013-08-31 DIAGNOSIS — R4182 Altered mental status, unspecified: Secondary | ICD-10-CM

## 2013-08-31 DIAGNOSIS — E119 Type 2 diabetes mellitus without complications: Secondary | ICD-10-CM

## 2013-08-31 DIAGNOSIS — N1330 Unspecified hydronephrosis: Secondary | ICD-10-CM

## 2013-08-31 DIAGNOSIS — E039 Hypothyroidism, unspecified: Secondary | ICD-10-CM

## 2013-08-31 LAB — CBC WITH DIFFERENTIAL/PLATELET
BASOS ABS: 0 10*3/uL (ref 0.0–0.1)
Basophils Relative: 4 % — ABNORMAL HIGH (ref 0–1)
Eosinophils Absolute: 0 10*3/uL (ref 0.0–0.7)
Eosinophils Relative: 3 % (ref 0–5)
HCT: 26.9 % — ABNORMAL LOW (ref 36.0–46.0)
Hemoglobin: 8.9 g/dL — ABNORMAL LOW (ref 12.0–15.0)
LYMPHS PCT: 62 % — AB (ref 12–46)
Lymphs Abs: 0.5 10*3/uL — ABNORMAL LOW (ref 0.7–4.0)
MCH: 27.8 pg (ref 26.0–34.0)
MCHC: 33.1 g/dL (ref 30.0–36.0)
MCV: 84.1 fL (ref 78.0–100.0)
MONOS PCT: 24 % — AB (ref 3–12)
Monocytes Absolute: 0.2 10*3/uL (ref 0.1–1.0)
NEUTROS PCT: 7 % — AB (ref 43–77)
Neutro Abs: 0 10*3/uL — ABNORMAL LOW (ref 1.7–7.7)
PLATELETS: 156 10*3/uL (ref 150–400)
RBC: 3.2 MIL/uL — ABNORMAL LOW (ref 3.87–5.11)
RDW: 16.3 % — AB (ref 11.5–15.5)
WBC: 0.7 10*3/uL — AB (ref 4.0–10.5)

## 2013-08-31 LAB — PROTIME-INR
INR: 5.55 (ref 0.00–1.49)
Prothrombin Time: 48.1 seconds — ABNORMAL HIGH (ref 11.6–15.2)

## 2013-08-31 LAB — GLUCOSE, CAPILLARY
GLUCOSE-CAPILLARY: 109 mg/dL — AB (ref 70–99)
Glucose-Capillary: 194 mg/dL — ABNORMAL HIGH (ref 70–99)
Glucose-Capillary: 127 mg/dL — ABNORMAL HIGH (ref 70–99)
Glucose-Capillary: 191 mg/dL — ABNORMAL HIGH (ref 70–99)

## 2013-08-31 LAB — COMPREHENSIVE METABOLIC PANEL
ALK PHOS: 57 U/L (ref 39–117)
ALT: 16 U/L (ref 0–35)
AST: 22 U/L (ref 0–37)
Albumin: 2.9 g/dL — ABNORMAL LOW (ref 3.5–5.2)
BUN: 80 mg/dL — AB (ref 6–23)
CO2: 16 meq/L — AB (ref 19–32)
Calcium: 9.7 mg/dL (ref 8.4–10.5)
Chloride: 99 mEq/L (ref 96–112)
Creatinine, Ser: 7.19 mg/dL — ABNORMAL HIGH (ref 0.50–1.10)
GFR, EST AFRICAN AMERICAN: 6 mL/min — AB (ref >90)
GFR, EST NON AFRICAN AMERICAN: 5 mL/min — AB (ref >90)
GLUCOSE: 113 mg/dL — AB (ref 70–99)
POTASSIUM: 4.7 meq/L (ref 3.7–5.3)
SODIUM: 133 meq/L — AB (ref 137–147)
Total Protein: 5.5 g/dL — ABNORMAL LOW (ref 6.0–8.3)

## 2013-08-31 LAB — HEMOGLOBIN A1C
Hgb A1c MFr Bld: 5.8 % — ABNORMAL HIGH (ref <5.7)
Mean Plasma Glucose: 120 mg/dL — ABNORMAL HIGH (ref <117)

## 2013-08-31 LAB — VANCOMYCIN, RANDOM: Vancomycin Rm: 13.7 ug/mL

## 2013-08-31 MED ORDER — VITAMIN K1 10 MG/ML IJ SOLN
1.0000 mg | Freq: Once | INTRAVENOUS | Status: AC
  Administered 2013-08-31: 1 mg via INTRAVENOUS
  Filled 2013-08-31: qty 0.1

## 2013-08-31 MED ORDER — TBO-FILGRASTIM 300 MCG/0.5ML ~~LOC~~ SOSY
300.0000 ug | PREFILLED_SYRINGE | Freq: Every day | SUBCUTANEOUS | Status: DC
  Administered 2013-08-31 – 2013-09-03 (×4): 300 ug via SUBCUTANEOUS
  Filled 2013-08-31 (×7): qty 0.5

## 2013-08-31 MED ORDER — VANCOMYCIN HCL 10 G IV SOLR
1250.0000 mg | INTRAVENOUS | Status: AC
  Administered 2013-08-31: 1250 mg via INTRAVENOUS
  Filled 2013-08-31: qty 1250

## 2013-08-31 NOTE — Progress Notes (Signed)
PROGRESS NOTE  [name] KXF:[id number] DOB: 06-14-1940 DOA: 08/30/2013 PCP: [name], MD  Assessment/Plan: Severe sepsis with evidence of renal failure and lactic acidosis  - blood pressure improved, continue IVF - continue Vancomycin and Primaxin  - blood and urine cultures pending  UTI - urine cultures sent, continue antibiotics as above  Recurrent diffuse B cell lymphoma - followed as an outpatient by Dr. [name]  Acute renal failure on CKD stage III - multifactorial, ?chemo induced, mild dehydration, Urinary infection  - no hydronephrosis on [place]  - nephrology consulted, appreciate input.  - avoid nephrotoxic agents  A fib with RVR - not in A fib currently, paced. Continue Coumadin and Cardizem  COPD - no wheezing or evidence of decompensation. Continue home medications  Hyperkalemia - due to renal failure. Kayexalate.  DM - stop metformin, SSI. Check A1C  Neutropenia - chemotherapy induced. Monitor. Neupogen per oncology. Anemia - multifactorial, of chronic disease, on Revlemid. No evidence of bleeding, monitor.  Supratherapeutc INR - Coumadin per pharmacy.  Acute encephalopathy - in the setting of #1, 2, 4  SSS s/p PCM   Diet: Renal Fluids: NS DVT Prophylaxis: Coumadin  Code Status: Full Family Communication: d/w patient  Disposition Plan: inpatient  Consultants:  Nephrology   Procedures:  none   Antibiotics Vancomycin 5/20 >> Primaxin 5/20 >>  HPI/Subjective: - no specific complaints this morning, appears less confused and remembers me from the ED room.   Objective: Filed Vitals:   08/31/13 0600 08/31/13 0700 08/31/13 0800 08/31/13 1011  BP: 105/47 109/47    Pulse: 69 72    Temp:   97.3 F (36.3 C)   TempSrc:   Oral   Resp: 18 14    Height:      Weight:      SpO2: 93% 96%  98%    Intake/Output Summary (Last 24 hours) at 08/31/13 1153 Last data filed at 08/31/13 6967  Gross per 24 hour  Intake   2295 ml  Output    100 ml    Net   2195 ml   Filed Weights   08/30/13 1708  Weight: 90.4 kg (199 lb 4.7 oz)    Exam:  General:  NAD  Cardiovascular: regular rate and rhythm, without MRG  Respiratory: good air movement, clear to auscultation throughout, no wheezing, ronchi or rales  Abdomen: soft, not tender to palpation, positive bowel sounds  MSK: no peripheral edema  Neuro: non focal  Data Reviewed: Basic Metabolic Panel:  Recent Labs Lab 08/30/13 1111 08/30/13 1210 08/30/13 2000 08/31/13 0345  NA 132* 130* 134* 133*  K 5.5* 5.7* 4.6 4.7  CL  --  92* 98 99  CO2 14* 14* 16* 16*  GLUCOSE 103 91 111* 113*  BUN 86.3* 85* 84* 80*  CREATININE 7.6 Repeated and Verified* 7.59* 7.37* 7.19*  CALCIUM 11.7* 11.5* 10.5 9.7   Liver Function Tests:  Recent Labs Lab 08/30/13 1111 08/30/13 1210 08/31/13 0345  AST 24 24 22   ALT 19 17 16   ALKPHOS 71 68 57  BILITOT 0.36 0.2* <0.2*  PROT 6.6 6.6 5.5*  ALBUMIN 3.4* 3.5 2.9*    Recent Labs Lab 08/30/13 1210  LIPASE 72*   CBC:  Recent Labs Lab 08/30/13 1110 08/30/13 1210 08/31/13 0345  WBC 2.0* 1.8* 0.7*  NEUTROABS 0.2 Repeated and Verified* 0.1* 0.0*  HGB 10.8* 10.3* 8.9*  HCT 34.1* 31.0* 26.9*  MCV 86.7 83.3 84.1  PLT 225 188 156   Cardiac Enzymes:  No results found for this basename: CKTOTAL, CKMB, CKMBINDEX, TROPONINI,  in the last 168 hours BNP (last 3 results) No results found for this basename: PROBNP,  in the last 8760 hours CBG:  Recent Labs Lab 08/30/13 1805 08/30/13 2118 08/31/13 0813  GLUCAP 86 154* 109*    Recent Results (from the past 240 hour(s))  CULTURE, BLOOD (ROUTINE X 2)     Status: None   Collection Time    08/30/13 12:35 PM      Result Value Ref Range Status   Specimen Description BLOOD RIGHT PORTA CATH   Final   Special Requests BOTTLES DRAWN AEROBIC AND ANAEROBIC 5 CC EA   Final   Culture  Setup Time     Final   Value: 08/30/2013 17:07     Performed at [facility]   Culture     Final    Value:        BLOOD CULTURE RECEIVED NO GROWTH TO DATE CULTURE WILL BE HELD FOR 5 DAYS BEFORE ISSUING A FINAL NEGATIVE REPORT     Performed at [facility]   Report Status PENDING   Incomplete  CULTURE, BLOOD (ROUTINE X 2)     Status: None   Collection Time    08/30/13  1:24 PM      Result Value Ref Range Status   Specimen Description BLOOD LEFT ANTECUBITAL   Final   Special Requests BOTTLES DRAWN AEROBIC AND ANAEROBIC 5CC   Final   Culture  Setup Time     Final   Value: 08/30/2013 17:07     Performed at [facility]   Culture     Final   Value:        BLOOD CULTURE RECEIVED NO GROWTH TO DATE CULTURE WILL BE HELD FOR 5 DAYS BEFORE ISSUING A FINAL NEGATIVE REPORT     Performed at [facility]   Report Status PENDING   Incomplete  MRSA PCR SCREENING     Status: None   Collection Time    08/30/13  5:08 PM      Result Value Ref Range Status   MRSA by PCR NEGATIVE  NEGATIVE Final   Comment:            The GeneXpert MRSA Assay (FDA     approved for NASAL specimens     only), is one component of a     comprehensive MRSA colonization     surveillance program. It is not     intended to diagnose MRSA     infection nor to guide or     monitor treatment for     MRSA infections.     Studies: US Renal  08/30/2013   CLINICAL DATA:  Flank pain.  EXAM: RENAL/URINARY TRACT ULTRASOUND COMPLETE  COMPARISON:  CT 06/05/2013.  FINDINGS: Right Kidney:  Length: 9.7 cm. Ureteral stent noted. Echogenicity within normal limits. No mass or hydronephrosis visualized.  Left Kidney:  Length: 9.9 cm. Echogenicity within normal limits. No mass or hydronephrosis visualized.  Bladder:  Bladder is nondistended. Bladder wall is thickened to approximately 9 mm.  IMPRESSION: 1. Right ureteral stent noted.  No hydronephrosis. 2. Bladder nondistended.  Bladder wall thickening is present.   Electronically Signed   By: [place]   On: 08/30/2013 15:03   Dg Chest Port 1 View  08/30/2013    CLINICAL DATA:  Nausea, emesis, diarrhea.  History of COPD.  EXAM: PORTABLE CHEST - 1 VIEW  COMPARISON:  Chest radiograph 06/26/2013 and CT chest abdomen pelvis 06/05/2013  FINDINGS: Right subclavian power port with the distal tip projecting over the superior vena cava. Left chest wall dual lead pacemaker appears stable. Cardiac leads project over the chest. Cardiomegaly appears similar to recent prior chest radiograph. Pulmonary vascularity is upper normal. The lungs appear clear. No airspace disease, effusion, or pneumothorax identified.  IMPRESSION: Mild cardiomegaly. Pulmonary vascularity is upper normal and the lungs appear clear.   Electronically Signed   By: Curlene Dolphin M.D.   On: 08/30/2013 14:43    Scheduled Meds: . dexamethasone  2 mg Oral BID WC  . diltiazem  180 mg Oral Daily  . imipenem-cilastatin  250 mg Intravenous Q12H  . insulin aspart  0-9 Units Subcutaneous TID WC  . levothyroxine  50 mcg Oral QAC breakfast  . pravastatin  40 mg Oral q1800  . tiotropium  18 mcg Inhalation Daily  . Warfarin - Pharmacist Dosing Inpatient   Does not apply q1800   Continuous Infusions: . sodium chloride 150 mL/hr at 08/31/13 0338  . senna-docusate      Active Problems:   Cardiac pacemaker in situ   Diffuse large B cell lymphoma   Hypothyroidism   Diabetes mellitus type 2 in obese   Hyperlipidemia   Antineoplastic chemotherapy induced pancytopenia   Acute renal failure   COPD (chronic obstructive pulmonary disease)   UTI (lower urinary tract infection)   Severe sepsis   Sepsis secondary to UTI   Generalized weakness   Acute on chronic renal failure   CKD (chronic kidney disease) stage 3, GFR 30-59 ml/min   Acute encephalopathy   Neutropenia   Time spent: 35  This note has been created with Surveyor, quantity. Any transcriptional errors are unintentional.   Marzetta Board, MD [facility] Pager [phone]. If 7 PM - 7 AM, please  contact night-coverage at [email or web address], password [facility] 08/31/2013, 11:53 AM  LOS: 1 day

## 2013-08-31 NOTE — Progress Notes (Signed)
[name], MD [address] [place] [address]  DIAGNOSIS: Acute encephalopathy  Acute on chronic renal failure  Anemia of chronic disease  Hypercalcemia  Diffuse large B cell lymphoma  Chief Complaint  Patient presents with  . Non-Hodgkin's lymphoma of abdomen    CURRENT TREATMENT:  Started Revlimid plus rituxan as a 4th line salvage therapy on 08/09/2013 (Witzig TE, et al, Ann Oncol. 2011 Jul;22 (7):1622-7.  She received rituxan on 08/09/2013.      Non-Hodgkin's lymphoma of abdomen   09/23/2010 Imaging CT abdomen and pelvis: 5.5 x 6 cm irregular right abdominal mass with adjacent small lymph nodes likely representing neoplasm.  This does not appear to connect to hollow or solid viscera and may represent a sarcoma.    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    11/03/2010 Initial Diagnosis Non-Hodgkin's lymphoma of abdomen   11/24/2010 Bone Marrow Biopsy No involvment of lymphoma identified.    11/25/2010 Cancer Staging PET/CT: 1.  Interval enlargement of a hypermetabolic ileocolic mesenteric nodal mass. 2.  Hypermetabolic focus corresponding to an enlarging precaval node.  This is consistent with active lymphoma. 3.  No evidence of extra-abdominal disease.   12/08/2010 Procedure R port-a-catheter placment   12/11/2010 - 03/27/2011 Chemotherapy Q 3 week R-CHOP x 6 cyles   04/27/2011 Cancer Staging PET/CT:  1.  Complete response to chemotherapy with no hypermetabolic activity associated with the mesenteric mass or prevascular lymph nodes which are also reduced in size.   05/28/2011 - 06/26/2011 Radiation Therapy Consolidative XRT [name]). Right abdomen and right pelvis/ 3060 cGy in 17 fractions   10/12/2011 Imaging CT C/A/P: 1.  Interval increase in size of aortocaval lymph node  measuring 12 mm compared to 6 mm on prior.  This is concerning for mild disease progression. 2.  Mesenteric mass in the ileocecal mesentery is decreased slightly in size.3.  Spleen is normal.   01/18/2012 Imaging CT C/A/P: 1.  Slight interval decrease in size of the mesenteric mass. 2.  Enlarging retroperitoneal lymph node. 3.  No new lymphadenopathy.   05/23/2012 Imaging CT of abdomen: 1.  Significant interval enlargement of a retroperitoneal LN  adjacent to the IVC and infrarenal abdominal aorta, which currently measures 3.9 x 2.9 cm.  Findings are compatible with progression of disease.  No new lymphadenopathy.    05/23/2012 Relapse/Recurrence She had relapsed disease.   06/29/2012 - 08/25/2012 Chemotherapy Completed 3 cycles of bendamustine/Rituxan on 06/29/2012.    08/25/2012 Progression Had disease progression while on Bendamustine/Rituxan   10/03/2012 Procedure Biopsy of R. Perotoneal mass   10/03/2012 Pathology Pathology consistent with high grade NH B-cell lymphoma   10/26/2012 - 03/14/2013 Chemotherapy She had salvage chemotherapy with R-GPD q 3 weeks.  Completed 5 cyles.    02/11/2013 - 02/24/2013 Hospital Admission Admitted secondary to febrile neutropenia.  Her infectious work-up was negative and her thrombocytopenia was prolonged.  She received antibiotics and plts transfusion.    03/27/2013 Imaging CT C/A/P on 12/15 was consistent with mild disease progression.    06/05/2013 Progression CT C/A/P consistent with moderate disease progression plus R hydronephosis (moderate,severe).  Planning R-ICE salvage chemotherapy followed by transplant [name], Dr. Cassell Clement). Referral to Urology   06/26/2013 - 07/03/2013 Hospital Admission Hospitalized with AMS, fevers and dysuria.  Treated for urosepsis with broad spectrum antibiotics.  Perc nephrostomy placed  on right kidney due to hydronephrosis.  Discharged to rehab.    07/14/2013 Procedure Had Perc nephrostomy removed with placement of right ureteral stent.      08/02/2013 -  Chemotherapy Starting rituximab (first dose on 08/09/2013) and lenalidomide 15 mg every other day. (Witzig, TE et. al, Ann Oncol. 2011 Jul;22(7): 1622-7.     INTERVAL HISTORY: [name] 73 y.o. female returns for follow up visit. She was last seen by me on 08/02/2013.  Today, she is accompanied by her niece [name].  Patient lives with her son who handles most of her intermediate ADLs.  She was last seen by Dr. Everardo All last week for her hypercalcemia and recommended low prednisone.  Niece reports patient has been confused and cannot state if she has been taking her medicines as instructed.   Patient does endorse dysuria.    MEDICAL HISTORY: Past Medical History  Diagnosis Date  . Sick sinus syndrome   . Hematoma     At the site of the pacemaker insertion.  . Hypotension   . Dyslipidemia   . History of atrial fibrillation   . Arthritis     Osteoarthritis  . Port-a-cath in place 12/08/10  . Status post chemotherapy completed 03/2011    R - CHOP q 3 weeks x 6  . S/P radiation therapy 05/28/2011 - 06/26/2011    Right Abdomen and Right Pelvis/3060 cGy in 17 Fractions  . Hypertension   . ASHD (arteriosclerotic heart disease)   . Gout   . COPD (chronic obstructive pulmonary disease)   . Asthma   . Hyperlipidemia   . Diabetes mellitus   . Hypothyroidism   . Diffuse large B cell lymphoma     Dr. Chisom,oncology  . Cancer 10/2010    large B cell lymphoma  . 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 Pacemaker     left chest  . Dysrhythmia     Hx. A. Fib, sick sinus syndrome- Pacemaker inserted.  . Cataracts, bilateral     INTERIM HISTORY: has Essential hypertension, benign; Cardiac pacemaker in situ; Diffuse large B cell lymphoma; Hypothyroidism; Diabetes mellitus type 2 in obese; Hyperlipidemia; Non-Hodgkin's lymphoma of abdomen; Depression; Fever and neutropenia; Antineoplastic chemotherapy induced pancytopenia; Acute renal failure;  Anemia of chronic disease; Chronic bronchitis; ASHD (arteriosclerotic heart disease); Gout; COPD (chronic obstructive pulmonary disease); Asthma; Atrial fibrillation with RVR; Hydronephrosis, right; UTI (lower urinary tract infection); Severe sepsis; Sepsis secondary to UTI; Constipation; Anticoagulation management encounter; Generalized weakness; Hypercalcemia; Acute on chronic renal failure; CKD (chronic kidney disease) stage 3, GFR 30-59 ml/min; Acute encephalopathy; and Neutropenia on her problem list.    ALLERGIES:  is allergic to codeine and penicillins.  MEDICATIONS: has a current medication list which includes the following prescription(s): albuterol, allopurinol, atorvastatin, vitamin d3, citalopram, dexamethasone, diltiazem, guaifenesin, lenalidomide, levothyroxine, lidocaine-prilocaine, magnesium oxide, metformin, ondansetron, polyethylene glycol, potassium chloride, pravastatin, sennosides-docusate sodium, tiotropium, urelle, and warfarin, and the following Facility-Administered Medications: sodium chloride, albuterol, dexamethasone, diltiazem, imipenem-cilastatin (PRIMAXIN) 250 mg in sodium chloride 0.9 % 100 mL IVPB, insulin aspart, levothyroxine, ondansetron, polyethylene glycol, pravastatin, senna-docusate, tiotropium, and warfarin - pharmacist dosing inpatient.  SURGICAL HISTORY:  Past Surgical History  Procedure Laterality Date  . Pacemaker insertion      Lead revision completed August 04, 2006- left chest G. Ladona Ridgel  . Tubal ligation      Bilateral  . Insert / replace / remove pacemaker    . Abdominal hysterectomy    . Biopsy stomach  11/03/10    Soft Tissue Mass, Biopsy, Right Lower Quadrant Mesenteric Mass- High Grade Non-Hodgkins B Cell Lymphoma  with Flow Cytometry  . Bone biopsy  11/24/10    Bone Marrow, Aspirate Biospy, and clot, Left - No Involvement of Non-Hodgkin's Lymphoma Identified  . Portacath placement Right 06-23-13  . Cholecystectomy      laparoscopic  . Bladder  suspension      REVIEW OF SYSTEMS:   Constitutional: Denies fevers, chills or abnormal weight loss Eyes: Denies blurriness of vision Ears, nose, mouth, throat, and face: Denies mucositis or sore throat Respiratory: Reports a chronic non-productive cough, but denies dyspnea or wheezes Cardiovascular: Denies palpitation, chest discomfort or lower extremity swelling Gastrointestinal:  Denies nausea, heartburn or change in bowel habits Skin: Denies abnormal skin rashes Lymphatics: Denies new lymphadenopathy or easy bruising Neurological:Denies numbness, tingling or new weaknesses Behavioral/Psych: Mood is stable, no new changes  All other systems were reviewed with the patient and are negative.  PHYSICAL EXAMINATION: ECOG PERFORMANCE STATUS: 1 - Symptomatic but completely ambulatory  Blood pressure 105/38, pulse 69, temperature 97.7 F (36.5 C), temperature source Oral, resp. rate 19, weight 0 lb (0 kg).  GENERAL:alert, no distress and comfortable; Obese lady seating in her wheelchair, chronically ill appearing. Intermittent confusion.  SKIN: skin color, texture, turgor are normal, no rashes or significant lesions; Port a cath on R.  EYES: normal, Conjunctiva are pink and non-injected, sclera clear OROPHARYNX:no exudate, no erythema and lips, buccal mucosa, and tongue normal  NECK: supple, thyroid normal size, non-tender, without nodularity LYMPH:  no palpable lymphadenopathy in the cervical, axillary or supraclavicular LUNGS: clear to auscultation with normal breathing effort, no wheezes or rhonchi HEART: regular rate & rhythm and no murmurs and no lower extremity edema ABDOMEN:abdomen soft, non-tender and normal bowel sounds Musculoskeletal:no cyanosis of digits and no clubbing  NEURO: alert & oriented x 3 with fluent speech, no focal motor/sensory deficits  Labs:  Lab Results  Component Value Date   WBC 0.7* 08/31/2013   HGB 8.9* 08/31/2013   HCT 26.9* 08/31/2013   MCV 84.1  08/31/2013   PLT 156 08/31/2013   NEUTROABS 0.0* 08/31/2013      Chemistry      Component Value Date/Time   NA 133* 08/31/2013 0345   NA 132* 08/30/2013 1111   NA 141 10/12/2011 0957   K 4.7 08/31/2013 0345   K 5.5* 08/30/2013 1111   K 4.4 10/12/2011 0957   CL 99 08/31/2013 0345   CL 102 09/21/2012 0917   CL 95* 10/12/2011 0957   CO2 16* 08/31/2013 0345   CO2 14* 08/30/2013 1111   CO2 29 10/12/2011 0957   BUN 80* 08/31/2013 0345   BUN 86.3* 08/30/2013 1111   BUN 15 10/12/2011 0957   CREATININE 7.19* 08/31/2013 0345   CREATININE 7.6 Repeated and Verified* 08/30/2013 1111   CREATININE 1.10 04/19/2013 1420      Component Value Date/Time   CALCIUM 9.7 08/31/2013 0345   CALCIUM 11.7* 08/30/2013 1111   CALCIUM 9.1 10/12/2011 0957   ALKPHOS 57 08/31/2013 0345   ALKPHOS 71 08/30/2013 1111   ALKPHOS 65 10/12/2011 0957   AST 22 08/31/2013 0345   AST 24 08/30/2013 1111   AST 19 10/12/2011 0957   ALT 16 08/31/2013 0345   ALT 19 08/30/2013 1111   ALT 20 10/12/2011 0957   BILITOT <0.2* 08/31/2013 0345   BILITOT 0.36 08/30/2013 1111   BILITOT 0.60 10/12/2011 0957       Basic  Metabolic Panel:  Recent Labs Lab 08/30/13 1111  08/30/13 1210 08/30/13 2000 08/31/13 0345  NA 132*  --  130* 134* 133*  K 5.5*  < > 5.7* 4.6 4.7  CL  --   --  92* 98 99  CO2 14*  --  14* 16* 16*  GLUCOSE 103  --  91 111* 113*  BUN 86.3*  --  85* 84* 80*  CREATININE 7.6 Repeated and Verified*  --  7.59* 7.37* 7.19*  CALCIUM 11.7*  --  11.5* 10.5 9.7  < > = values in this interval not displayed. GFR The CrCl is unknown because both a height and weight (above a minimum accepted value) are required for this calculation. Liver Function Tests:  Recent Labs Lab 08/30/13 1111 08/30/13 1210 08/31/13 0345  AST 24 24 22   ALT 19 17 16   ALKPHOS 71 68 57  BILITOT 0.36 0.2* <0.2*  PROT 6.6 6.6 5.5*  ALBUMIN 3.4* 3.5 2.9*    CBC:  Recent Labs Lab 08/30/13 1110 08/30/13 1210 08/31/13 0345  WBC 2.0* 1.8* 0.7*  NEUTROABS 0.2 Repeated  and Verified* 0.1* 0.0*  HGB 10.8* 10.3* 8.9*  HCT 34.1* 31.0* 26.9*  MCV 86.7 83.3 84.1  PLT 225 188 156    Studies:  US Renal  08/30/2013   CLINICAL DATA:  Flank pain.  EXAM: RENAL/URINARY TRACT ULTRASOUND COMPLETE  COMPARISON:  CT 06/05/2013.  FINDINGS: Right Kidney:  Length: 9.7 cm. Ureteral stent noted. Echogenicity within normal limits. No mass or hydronephrosis visualized.  Left Kidney:  Length: 9.9 cm. Echogenicity within normal limits. No mass or hydronephrosis visualized.  Bladder:  Bladder is nondistended. Bladder wall is thickened to approximately 9 mm.  IMPRESSION: 1. Right ureteral stent noted.  No hydronephrosis. 2. Bladder nondistended.  Bladder wall thickening is present.   Electronically Signed   By: Hardy   On: 08/30/2013 15:03   Dg Chest Port 1 View  08/30/2013   CLINICAL DATA:  Nausea, emesis, diarrhea.  History of COPD.  EXAM: PORTABLE CHEST - 1 VIEW  COMPARISON:  Chest radiograph 06/26/2013 and CT chest abdomen pelvis 06/05/2013  FINDINGS: Right subclavian power port with the distal tip projecting over the superior vena cava. Left chest wall dual lead pacemaker appears stable. Cardiac leads project over the chest. Cardiomegaly appears similar to recent prior chest radiograph. Pulmonary vascularity is upper normal. The lungs appear clear. No airspace disease, effusion, or pneumothorax identified.  IMPRESSION: Mild cardiomegaly. Pulmonary vascularity is upper normal and the lungs appear clear.   Electronically Signed   By: Curlene Dolphin M.D.   On: 08/30/2013 14:43     RADIOGRAPHIC STUDIES: None  ASSESSMENT: Westley Gambles Fier 73 y.o. female with a history of Acute encephalopathy  Acute on chronic renal failure  Anemia of chronic disease  Hypercalcemia  Diffuse large B cell lymphoma   PLAN:   1. AMS secondary to #2, #3, #4. --Patient is likely septic from a urinary source.  We will refer to emergency room for antibiotics and hydration.  She will likely  require stepdown admission.    2. Acute of Chronic Renal Failure. --Patient had recent admission for urosepsis.  Consult renal.  Ultrasound of kidney and acute renal failure workup. Uremia can also contribute to #1.   Intravenous hydration.   3. Hypercalcemia. --Likely secondary to #4.  Patient is not a candidate for bisphophonates secondary to #2.  Hydration and low dose steroids.   Treat  #4 as tolerated.  4. Relapsed,  refractory high grade non-hodgkin's B cell Lymphoma on salvage therapy, progressed on salvage R-GDP; Started rituxan plus revlimid as 4th line therapy.  -She completed 5 cycles of R+DGP q 21 days (modified doses based on dehydration, electrolyte abnormalities) and we reviewed the CT Scans of C/A/P (2/23) which was consistent with a further moderate increase in retroperitoneal lymphadenopathy complicated by right hydronephrosis. She now has a right ureteral stent in place.   --  We started a less aggressive 4-th line therapy with Rituxan plus revlimid based on Witzig, TE, et. Adrian Prince Oncol. 2011; Jul;22(7):1622-7.  Of the 108 patients with DLBCL 28% or 30 patients had an overall response rate.  Most common adverse events was myelosuppression with grade 4 neutropenia and thrombocytopenia in 17% and 6%, respectively.  They were given lenalidomide 25 mg on days 1-21 every 28 days as tolerated or until progression.  Given her renal insufficiency, we started at 15 mg every other day.  In addition, we gave rituximab 375 mg/m2 every 28 days started on 08/09/2013.    --Patient overall health is declining.   5. S/P R. Hydronephrosis, s/p R ureteral stent (04/03) --Patient had R ureteral stent placed on 04/03.    6. Neutropenia.  -- Could be secondary to #4 with bone marrow involvement versus revlimid (not sure how much or if she started taking this).  We will obtain corollary history from her son.  We will start neupogen daily.    7. Anemia secondary to #4 or chemotherapy.  --Please try  maintain a hemoglobin greater than 8.     8. Hypothyroidism: Levothyroxine per PCP.  Please check TSH given number 1.   9. Hypertension: Hold in the setting of possible sepsis.  10. Diabetes mellitus, type II: Metformin per PCP. Hold per renal function.  11. Hyperlipidemia: Pravastatin per PCP.  12.History of atrial fibrillation: Diltiazem, pacemaker. Coumadin being managed per her PCP previously.   13. COPD/ Chronic Bronchitis exacerbation.  --Patient advised to continue albuterol q 6 hours as needed.  14. Follow-up in 3 weeks for a symptom visit.   All questions were answered. The patient knows to call the clinic with any problems, questions or concerns. We can certainly see the patient much sooner if necessary.  I spent 25 minutes counseling the patient face to face. The total time spent in the appointment was 40 minutes.    Concha Norway, MD 08/31/2013 9:27 AM

## 2013-08-31 NOTE — Progress Notes (Signed)
[facility] for Warfarin Indication: atrial fibrillation  Allergies  Allergen Reactions  . Codeine Nausea Only  . Penicillins     Unknown    Patient Measurements: Height: 4\' 11"  (149.9 cm) Weight: 199 lb 4.7 oz (90.4 kg) IBW/kg (Calculated) : 43.2  Vital Signs: Temp: 97.8 F (36.6 C) (05/21 0400) Temp src: Oral (05/21 0400) BP: 109/47 mmHg (05/21 0700) Pulse Rate: 72 (05/21 0700)  Labs:  Recent Labs  08/30/13 1110  08/30/13 1117 08/30/13 1210 08/30/13 2000 08/31/13 0345  HGB 10.8*  --   --  10.3*  --  8.9*  HCT 34.1*  --   --  31.0*  --  26.9*  PLT 225  --   --  188  --  156  LABPROT  --   --  40.3*  --   --  48.1*  INR Sent out for confirmation  --  4.4*  --   --  5.55*  CREATININE  --   < >  --  7.59* 7.37* 7.19*  < > = values in this interval not displayed.  Estimated Creatinine Clearance: 6.9 ml/min (by C-G formula based on Cr of 7.19).   Medical History: Past Medical History  Diagnosis Date  . Sick sinus syndrome   . Hematoma     At the site of the pacemaker insertion.  . Hypotension   . Dyslipidemia   . History of atrial fibrillation   . Arthritis     Osteoarthritis  . Port-a-cath in place 12/08/10  . Status post chemotherapy completed 03/2011    R - CHOP q 3 weeks x 6  . S/P radiation therapy 05/28/2011 - 06/26/2011    Right Abdomen and Right Pelvis/3060 cGy in 17 Fractions  . Hypertension   . ASHD (arteriosclerotic heart disease)   . Gout   . COPD (chronic obstructive pulmonary disease)   . Asthma   . Hyperlipidemia   . Diabetes mellitus   . Hypothyroidism   . Diffuse large B cell lymphoma     Dr. Chisom,oncology  . Cancer 10/2010    large B cell lymphoma  . 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 Pacemaker     left chest  . Dysrhythmia     Hx. A. Fib, sick sinus syndrome- Pacemaker inserted.  . Cataracts, bilateral     Medications:  Scheduled:  .  dexamethasone  2 mg Oral BID WC  . diltiazem  180 mg Oral Daily  . imipenem-cilastatin  250 mg Intravenous Q12H  . insulin aspart  0-9 Units Subcutaneous TID WC  . levothyroxine  50 mcg Oral QAC breakfast  . pravastatin  40 mg Oral q1800  . tiotropium  18 mcg Inhalation Daily  . vancomycin  1,250 mg Intravenous STAT  . Warfarin - Pharmacist Dosing Inpatient   Does not apply q1800   Infusions:  . sodium chloride 150 mL/hr at 08/31/13 0338  . senna-docusate      Assessment: [name] admitted 5/20 with sepsis on chronic warfarin prior to admission for history of atrial fibrillation.  PMH also includes: UTI, recurrent diffuse B cell lymphoma, CKD, COPD, DM, neutropenia, anemia.  Pharmacy is consulted to continue warfarin dosing inpatient.  PTA Warfarin 3 mg daily, last dose on 5/19  INR on admission is supratherapeutic at 4.4, warfarin held  INR today remains supratherapeutic, 5.55, despite holding warfarin  Vitamin K 1mg  IV given 5/21 AM  SCr 7.19  CBC:  Hgb 8.9 (decreased from 10.3), Plt 156.  Follow.  No bleeding or complications reported.   Goal of Therapy:  INR 2-3 Monitor platelets by anticoagulation protocol: Yes   Plan:   Continue to hold warfarin today.  Daily INR and CBC   Gretta Arab PharmD, BCPS Pager [phone] 08/31/2013 8:39 AM

## 2013-08-31 NOTE — Progress Notes (Signed)
ANTIBIOTIC CONSULT NOTE - FOLLOW UP  Pharmacy Consult for Vancomycin Indication: rule out sepsis  Allergies  Allergen Reactions  . Codeine Nausea Only  . Penicillins     Unknown    Patient Measurements: Height: 4\' 11"  (149.9 cm) Weight: 199 lb 4.7 oz (90.4 kg) IBW/kg (Calculated) : 43.2 Adjusted Body Weight:   Vital Signs: Temp: 97.8 F (36.6 C) (05/21 0400) Temp src: Oral (05/21 0400) BP: 95/43 mmHg (05/21 0500) Pulse Rate: 69 (05/21 0500) Intake/Output from previous day: 05/20 0701 - 05/21 0700 In: 2045 [P.O.:120; I.V.:1775; IV Piggyback:150] Out: 100 [Urine:100] Intake/Output from this shift: Total I/O In: 1770 [P.O.:120; I.V.:1500; IV Piggyback:150] Out: -   Labs:  Recent Labs  08/30/13 1110  08/30/13 1210 08/30/13 2000 08/31/13 0345  WBC 2.0*  --  1.8*  --  0.7*  HGB 10.8*  --  10.3*  --  8.9*  PLT 225  --  188  --  156  CREATININE  --   < > 7.59* 7.37* 7.19*  < > = values in this interval not displayed. Estimated Creatinine Clearance: 6.9 ml/min (by C-G formula based on Cr of 7.19).  Recent Labs  08/31/13 0345  VANCORANDOM 13.7     Microbiology: Recent Results (from the past 720 hour(s))  URINE CULTURE     Status: None   Collection Time    08/02/13  9:42 AM      Result Value Ref Range Status   Urine Culture, Routine Culture, Urine   Final   Comment: Final - ===== COLONY COUNT: =====     >=100,000 COLONIES/ML     ENTEROCOCCUS SPECIES          ------------------------------------------------------------------------      ENTEROCOCCUS SPECIES             AMPICILLIN                       MIC      Sensitive        <=2 ug/ml        LEVOFLOXACIN                     MIC      Resistant        >=8 ug/ml        NITROFURANTOIN                   MIC      Sensitive       <=16 ug/ml        VANCOMYCIN                       MIC      Sensitive          1 ug/ml        TETRACYCLINE                     MIC      Resistant       >=16 ug/ml     END OF REPORT   MRSA PCR SCREENING     Status: None   Collection Time    08/30/13  5:08 PM      Result Value Ref Range Status   MRSA by PCR NEGATIVE  NEGATIVE Final   Comment:            The GeneXpert MRSA Assay (FDA     approved for NASAL specimens  only), is one component of a     comprehensive MRSA colonization     surveillance program. It is not     intended to diagnose MRSA     infection nor to guide or     monitor treatment for     MRSA infections.    Anti-infectives   Start     Dose/Rate Route Frequency Ordered Stop   08/31/13 0645  vancomycin (VANCOCIN) 1,250 mg in sodium chloride 0.9 % 250 mL IVPB     1,250 mg 166.7 mL/hr over 90 Minutes Intravenous STAT 08/31/13 0641 09/01/13 0645   08/31/13 0200  imipenem-cilastatin (PRIMAXIN) 250 mg in sodium chloride 0.9 % 100 mL IVPB     250 mg 200 mL/hr over 30 Minutes Intravenous Every 12 hours 08/30/13 1240     08/30/13 1300  vancomycin (VANCOCIN) IVPB 1000 mg/200 mL premix     1,000 mg 200 mL/hr over 60 Minutes Intravenous STAT 08/30/13 1240 08/30/13 1415   08/30/13 1300  imipenem-cilastatin (PRIMAXIN) 250 mg in sodium chloride 0.9 % 100 mL IVPB     250 mg 200 mL/hr over 30 Minutes Intravenous STAT 08/30/13 1240 08/30/13 1511      Assessment: Patient being dosed vancomycin via levels.  Vancomycin level is low with am level.  Goal of Therapy:  Vancomycin trough level 15-20 mcg/ml  Plan:  Follow up culture results Redose vancomycin 1250mg  iv x1, recheck levels as needed  [employer]. 08/31/2013,6:42 AM
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
## 2013-09-01 ENCOUNTER — Telehealth: Payer: Self-pay | Admitting: Internal Medicine

## 2013-09-01 ENCOUNTER — Encounter (HOSPITAL_COMMUNITY): Payer: Self-pay | Admitting: *Deleted

## 2013-09-01 DIAGNOSIS — D630 Anemia in neoplastic disease: Secondary | ICD-10-CM

## 2013-09-01 DIAGNOSIS — C8589 Other specified types of non-Hodgkin lymphoma, extranodal and solid organ sites: Secondary | ICD-10-CM

## 2013-09-01 LAB — URINE CULTURE: Colony Count: >=100000

## 2013-09-01 LAB — CBC
HCT: 25 % — ABNORMAL LOW (ref 36.0–46.0)
Hemoglobin: 8.3 g/dL — ABNORMAL LOW (ref 12.0–15.0)
MCH: 27.7 pg (ref 26.0–34.0)
MCHC: 33.2 g/dL (ref 30.0–36.0)
MCV: 83.3 fL (ref 78.0–100.0)
PLATELETS: 166 10*3/uL (ref 150–400)
RBC: 3 MIL/uL — AB (ref 3.87–5.11)
RDW: 16.4 % — AB (ref 11.5–15.5)
WBC: 0.8 10*3/uL — CL (ref 4.0–10.5)

## 2013-09-01 LAB — GLUCOSE, CAPILLARY
GLUCOSE-CAPILLARY: 124 mg/dL — AB (ref 70–99)
Glucose-Capillary: 162 mg/dL — ABNORMAL HIGH (ref 70–99)
Glucose-Capillary: 117 mg/dL — ABNORMAL HIGH (ref 70–99)
Glucose-Capillary: 165 mg/dL — ABNORMAL HIGH (ref 70–99)

## 2013-09-01 LAB — BASIC METABOLIC PANEL
BUN: 78 mg/dL — ABNORMAL HIGH (ref 6–23)
CALCIUM: 8.5 mg/dL (ref 8.4–10.5)
CO2: 15 mEq/L — ABNORMAL LOW (ref 19–32)
CREATININE: 6.5 mg/dL — AB (ref 0.50–1.10)
Chloride: 102 mEq/L (ref 96–112)
GFR calc Af Amer: 7 mL/min — ABNORMAL LOW (ref >90)
GFR, EST NON AFRICAN AMERICAN: 6 mL/min — AB (ref >90)
GLUCOSE: 166 mg/dL — AB (ref 70–99)
Potassium: 3.8 mEq/L (ref 3.7–5.3)
Sodium: 134 mEq/L — ABNORMAL LOW (ref 137–147)

## 2013-09-01 LAB — PROTIME-INR
INR: 2.02 — ABNORMAL HIGH (ref 0.00–1.49)
PROTHROMBIN TIME: 22.2 s — AB (ref 11.6–15.2)

## 2013-09-01 LAB — VANCOMYCIN, RANDOM
VANCOMYCIN RM: 23.6 ug/mL
Vancomycin Rm: 21.9 ug/mL

## 2013-09-01 MED ORDER — WARFARIN SODIUM 3 MG PO TABS
3.0000 mg | ORAL_TABLET | Freq: Once | ORAL | Status: AC
  Administered 2013-09-01: 3 mg via ORAL
  Filled 2013-09-01: qty 1

## 2013-09-01 MED ORDER — SODIUM CHLORIDE 0.9 % IV BOLUS (SEPSIS)
500.0000 mL | Freq: Once | INTRAVENOUS | Status: AC
  Administered 2013-09-01: 500 mL via INTRAVENOUS

## 2013-09-01 MED ORDER — VANCOMYCIN HCL IN DEXTROSE 1-5 GM/200ML-% IV SOLN
1000.0000 mg | INTRAVENOUS | Status: DC
  Administered 2013-09-02: 1000 mg via INTRAVENOUS
  Filled 2013-09-01: qty 200

## 2013-09-01 NOTE — Progress Notes (Signed)
ANTIBIOTIC CONSULT NOTE - FOLLOW UP  Pharmacy Consult for Vancomycin/Primaxin Indication: sepsis  Allergies  Allergen Reactions  . Codeine Nausea Only  . Penicillins     Unknown    Patient Measurements: Height: 4\' 11"  (149.9 cm) Weight: 208 lb 12.4 oz (94.7 kg) IBW/kg (Calculated) : 43.2  Vital Signs: Temp: 97.8 F (36.6 C) (05/22 0800) Temp src: Oral (05/22 0800) BP: 118/62 mmHg (05/22 1025) Pulse Rate: 82 (05/22 1025) Intake/Output from previous day: 05/21 0701 - 05/22 0700 In: 2860 [P.O.:360; I.V.:1650; IV Piggyback:850] Out: 1040 [VHQIO:9629] Intake/Output from this shift: Total I/O In: 810 [P.O.:360; I.V.:450] Out: 400 [Urine:400]  Labs:  Recent Labs  08/30/13 1210 08/30/13 2000 08/31/13 0345 09/01/13 0419  WBC 1.8*  --  0.7* 0.8*  HGB 10.3*  --  8.9* 8.3*  PLT 188  --  156 166  CREATININE 7.59* 7.37* 7.19* 6.50*   Estimated Creatinine Clearance: 7.9 ml/min (by C-G formula based on Cr of 6.5).  Recent Labs  09/01/13 0420 09/01/13 1020  VANCORANDOM 23.6 21.9     Microbiology: 5/20 MRSA PCR: neg 5/20 blood x2: ngtd 5/20 urine: enterococcus species  Assessment: 73yo F with lymphoma, admitted from home via the cancer center d/t weakness, vomiting and diarrhea. Code Sepsis was activated and pharmacy is asked to dose Vanc and Primaxin in neutropenic patient.  Patient has acute renal failure so pharmacy is following vancomycin levels carefully for dosing.  5/20 >> Primaxin >> 5/20 >> Vanc >>   Tmax: AF WBCs: 0.8 (PTA decadron resumed) (ANC = 0 yesterday) Renal: AoCKD, SCr 6.50 (slowly improving), CrCl ~11 ml/min (CG), ~8.8 ml/min (N) Lactic acid: 2.3 (5/20)  Drug level / dose changes info: 5/21 Vanc random (after 1 dose) = 13.7 5/22 Vanc random = 23.6 after 1250 mg dose, Repeat 6 hours later = 21.6. Ke 0.0125, t1/2 55h. New dose calculated = 1g q48h.  Goal of Therapy:  Vancomycin trough level 15-20 mcg/ml Doses adjusted per renal  function  Plan:  1.  Vancomycin 1g IV q48h.  Will start tomorrow morning to allow for level to drop into therapeutic range.  Anticipate further dose adjustments as SCr improves. 2.  Continue Primaxin 250 mg IV q12h. 3.  F/u SCr, culture results, vancomycin levels as needed.  Donald Prose Oluwaseyi Tull 09/01/2013,11:29 AM

## 2013-09-01 NOTE — Progress Notes (Signed)
PROGRESS NOTE   Nerida Boivin Fiorenza ERX:[id number] DOB: 06-24-1940 DOA: 08/30/2013 PCP: [name], MD  Assessment/Plan:  Severe sepsis with evidence of renal failure and lactic acidosis  - blood pressure improved, continue IVF - continue Vancomycin and Primaxin  - blood and urine cultures sent - enterococcus in the urine, sensitivities pending UTI - urine cultures sent, continue antibiotics as above Recurrent diffuse B cell lymphoma - followed as an outpatient by Dr. [name]  Acute renal failure on CKD stage III - multifactorial, ?chemo induced, mild dehydration, Urinary infection  - no hydronephrosis on [place]  - nephrology consulted, appreciate input  - avoid nephrotoxic agents  - slowly improving  A fib with RVR - not in A fib currently, paced. Continue Coumadin and Cardizem  COPD - no wheezing or evidence of decompensation. Continue home medications  Hyperkalemia - due to renal failure. Kayexalate.  DM - stop metformin, SSI. Check A1C  Neutropenia - chemotherapy induced. Monitor. Neupogen per oncology.  Anemia - multifactorial, of chronic disease, on Revlemid. No evidence of bleeding, monitor.  Supratherapeutc INR - Coumadin per pharmacy.  Acute encephalopathy - in the setting of #1, 2, 4 SSS s/p PCM    Diet: Renal Fluids: NS DVT Prophylaxis: Coumadin  Code Status: Full Family Communication: d/w patient  Disposition Plan: inpatient  Consultants:  Nephrology   Procedures:  none   Antibiotics Vancomycin 5/20 >> Primaxin 5/20 >>  HPI/Subjective: - no complaints, denies shortness of breath   Objective: Filed Vitals:   09/01/13 0200 09/01/13 0400 09/01/13 0500 09/01/13 0625  BP: 104/42 105/41    Pulse: 74 89 87   Temp:   97.7 F (36.5 C)   TempSrc:   Oral   Resp: 14 22 17    Height:      Weight:   94.7 kg (208 lb 12.4 oz)   SpO2: 93% 98% 98% 96%    Intake/Output Summary (Last 24 hours) at 09/01/13 0740 Last data filed at 09/01/13 0500  Gross per  24 hour  Intake   2460 ml  Output   1040 ml  Net   1420 ml   Filed Weights   08/30/13 1708 08/31/13 1011 09/01/13 0500  Weight: 90.4 kg (199 lb 4.7 oz) 92.3 kg (203 lb 7.8 oz) 94.7 kg (208 lb 12.4 oz)   Exam:  General:  NAD  Cardiovascular: regular rate and rhythm, without MRG  Respiratory: good air movement, clear to auscultation throughout, no wheezing, ronchi or rales  Abdomen: soft, not tender to palpation, positive bowel sounds  MSK: no peripheral edema  Neuro: non focal  Data Reviewed: Basic Metabolic Panel:  Recent Labs Lab 08/30/13 1111 08/30/13 1210 08/30/13 2000 08/31/13 0345 09/01/13 0419  NA 132* 130* 134* 133* 134*  K 5.5* 5.7* 4.6 4.7 3.8  CL  --  92* 98 99 102  CO2 14* 14* 16* 16* 15*  GLUCOSE 103 91 111* 113* 166*  BUN 86.3* 85* 84* 80* 78*  CREATININE 7.6 Repeated and Verified* 7.59* 7.37* 7.19* 6.50*  CALCIUM 11.7* 11.5* 10.5 9.7 8.5   Liver Function Tests:  Recent Labs Lab 08/30/13 1111 08/30/13 1210 08/31/13 0345  AST 24 24 22   ALT 19 17 16   ALKPHOS 71 68 57  BILITOT 0.36 0.2* <0.2*  PROT 6.6 6.6 5.5*  ALBUMIN 3.4* 3.5 2.9*    Recent Labs Lab 08/30/13 1210  LIPASE 72*   CBC:  Recent Labs Lab 08/30/13 1110 08/30/13 1210 08/31/13 0345 09/01/13 0419  WBC 2.0* 1.8*  0.7* 0.8*  NEUTROABS 0.2 Repeated and Verified* 0.1* 0.0*  --   HGB 10.8* 10.3* 8.9* 8.3*  HCT 34.1* 31.0* 26.9* 25.0*  MCV 86.7 83.3 84.1 83.3  PLT 225 188 156 166   Cardiac Enzymes: No results found for this basename: CKTOTAL, CKMB, CKMBINDEX, TROPONINI,  in the last 168 hours BNP (last 3 results) No results found for this basename: PROBNP,  in the last 8760 hours CBG:  Recent Labs Lab 08/30/13 2118 08/31/13 0813 08/31/13 1211 08/31/13 1602 08/31/13 2115  GLUCAP 154* 109* 194* 127* 191*    Recent Results (from the past 240 hour(s))  CULTURE, BLOOD (ROUTINE X 2)     Status: None   Collection Time    08/30/13 12:35 PM      Result Value Ref  Range Status   Specimen Description BLOOD RIGHT PORTA CATH   Final   Special Requests BOTTLES DRAWN AEROBIC AND ANAEROBIC 5 CC EA   Final   Culture  Setup Time     Final   Value: 08/30/2013 17:07     Performed at [facility]   Culture     Final   Value:        BLOOD CULTURE RECEIVED NO GROWTH TO DATE CULTURE WILL BE HELD FOR 5 DAYS BEFORE ISSUING A FINAL NEGATIVE REPORT     Performed at [facility]   Report Status PENDING   Incomplete  CULTURE, BLOOD (ROUTINE X 2)     Status: None   Collection Time    08/30/13  1:24 PM      Result Value Ref Range Status   Specimen Description BLOOD LEFT ANTECUBITAL   Final   Special Requests BOTTLES DRAWN AEROBIC AND ANAEROBIC 5CC   Final   Culture  Setup Time     Final   Value: 08/30/2013 17:07     Performed at [facility]   Culture     Final   Value:        BLOOD CULTURE RECEIVED NO GROWTH TO DATE CULTURE WILL BE HELD FOR 5 DAYS BEFORE ISSUING A FINAL NEGATIVE REPORT     Performed at [facility]   Report Status PENDING   Incomplete  URINE CULTURE     Status: None   Collection Time    08/30/13  1:25 PM      Result Value Ref Range Status   Specimen Description URINE, CATHETERIZED   Final   Special Requests NONE   Final   Culture  Setup Time     Final   Value: 08/31/2013 01:47     Performed at [facility]     Final   Value: >=100,000 COLONIES/ML     Performed at [facility]   Culture     Final   Value: ENTEROCOCCUS SPECIES     Performed at [facility]   Report Status PENDING   Incomplete  MRSA PCR SCREENING     Status: None   Collection Time    08/30/13  5:08 PM      Result Value Ref Range Status   MRSA by PCR NEGATIVE  NEGATIVE Final   Comment:            The GeneXpert MRSA Assay (FDA     approved for NASAL specimens     only), is one component of a     comprehensive MRSA colonization     surveillance program. It is not  intended to diagnose MRSA      infection nor to guide or     monitor treatment for     MRSA infections.     Studies: US Renal  08/30/2013   CLINICAL DATA:  Flank pain.  EXAM: RENAL/URINARY TRACT ULTRASOUND COMPLETE  COMPARISON:  CT 06/05/2013.  FINDINGS: Right Kidney:  Length: 9.7 cm. Ureteral stent noted. Echogenicity within normal limits. No mass or hydronephrosis visualized.  Left Kidney:  Length: 9.9 cm. Echogenicity within normal limits. No mass or hydronephrosis visualized.  Bladder:  Bladder is nondistended. Bladder wall is thickened to approximately 9 mm.  IMPRESSION: 1. Right ureteral stent noted.  No hydronephrosis. 2. Bladder nondistended.  Bladder wall thickening is present.   Electronically Signed   By: [place]   On: 08/30/2013 15:03   Dg Chest Port 1 View  08/30/2013   CLINICAL DATA:  Nausea, emesis, diarrhea.  History of COPD.  EXAM: PORTABLE CHEST - 1 VIEW  COMPARISON:  Chest radiograph 06/26/2013 and CT chest abdomen pelvis 06/05/2013  FINDINGS: Right subclavian power port with the distal tip projecting over the superior vena cava. Left chest wall dual lead pacemaker appears stable. Cardiac leads project over the chest. Cardiomegaly appears similar to recent prior chest radiograph. Pulmonary vascularity is upper normal. The lungs appear clear. No airspace disease, effusion, or pneumothorax identified.  IMPRESSION: Mild cardiomegaly. Pulmonary vascularity is upper normal and the lungs appear clear.   Electronically Signed   By: Curlene Dolphin M.D.   On: 08/30/2013 14:43    Scheduled Meds: . dexamethasone  2 mg Oral BID WC  . imipenem-cilastatin  250 mg Intravenous Q12H  . insulin aspart  0-9 Units Subcutaneous TID WC  . levothyroxine  50 mcg Oral QAC breakfast  . pravastatin  40 mg Oral q1800  . Tbo-Filgrastim  300 mcg Subcutaneous q1800  . tiotropium  18 mcg Inhalation Daily  . Warfarin - Pharmacist Dosing Inpatient   Does not apply q1800   Continuous Infusions: . sodium chloride 150 mL/hr at  09/01/13 0400  . senna-docusate      Active Problems:   Cardiac pacemaker in situ   Diffuse large B cell lymphoma   Hypothyroidism   Diabetes mellitus type 2 in obese   Hyperlipidemia   Antineoplastic chemotherapy induced pancytopenia   Acute renal failure   COPD (chronic obstructive pulmonary disease)   UTI (lower urinary tract infection)   Severe sepsis   Sepsis secondary to UTI   Generalized weakness   Acute on chronic renal failure   CKD (chronic kidney disease) stage 3, GFR 30-59 ml/min   Acute encephalopathy   Neutropenia  Time spent: 25  This note has been created with Surveyor, quantity. Any transcriptional errors are unintentional.   Marzetta Board, MD [facility] Pager [phone]. If 7 PM - 7 AM, please contact night-coverage at [email or web address], password [facility] 09/01/2013, 7:40 AM  LOS: 2 days

## 2013-09-01 NOTE — Progress Notes (Signed)
[name]   DOB:1940/11/02   ZO#:[id number]   JWJ#:[id number]  Subjective:Patient denies any acute events.  She did received 500 cc bolus secondary to drop in blood pressure. She tolerates her diet.   Objective:  Filed Vitals:   09/01/13 1200  BP: 113/49  Pulse: 87  Temp: 98.6 F (37 C)  Resp: 20    Body mass index is 42.15 kg/(m^2).  Intake/Output Summary (Last 24 hours) at 09/01/13 1311 Last data filed at 09/01/13 1200  Gross per 24 hour  Intake   3720 ml  Output   1440 ml  Net   2280 ml    Oriented to self, place. Slowed mentation with frequent pauses to try to remember  Sclerae unicteric  Oropharynx clear  No peripheral adenopathy  Lungs clear -- no rales or rhonchi  Regular S1 S2 without murmurs  Abdomen distended  MSK  No peripheral edema  Neuro, moves all her extremities.     CBG (last 3)   Recent Labs  08/31/13 2115 09/01/13 0818 09/01/13 1143  GLUCAP 191* 162* 117*     Labs:  Lab Results  Component Value Date   WBC 0.8* 09/01/2013   HGB 8.3* 09/01/2013   HCT 25.0* 09/01/2013   MCV 83.3 09/01/2013   PLT 166 09/01/2013   NEUTROABS 0.0* 10/01/3084    Basic Metabolic Panel:  Recent Labs Lab 08/30/13 1111  08/30/13 1210 08/30/13 2000 08/31/13 0345 09/01/13 0419  NA 132*  --  130* 134* 133* 134*  K 5.5*  < > 5.7* 4.6 4.7 3.8  CL  --   --  92* 98 99 102  CO2 14*  --  14* 16* 16* 15*  GLUCOSE 103  --  91 111* 113* 166*  BUN 86.3*  --  85* 84* 80* 78*  CREATININE 7.6 Repeated and Verified*  --  7.59* 7.37* 7.19* 6.50*  CALCIUM 11.7*  --  11.5* 10.5 9.7 8.5  < > = values in this interval not displayed. GFR Estimated Creatinine Clearance: 7.9 ml/min (by C-G formula based on Cr of 6.5). Liver Function Tests:  Recent Labs Lab 08/30/13 1111 08/30/13 1210 08/31/13 0345  AST 24 24 22   ALT 19 17 16   ALKPHOS 71 68 57  BILITOT 0.36 0.2* <0.2*  PROT 6.6 6.6 5.5*  ALBUMIN 3.4* 3.5 2.9*    Recent Labs Lab 08/30/13 1210  LIPASE 72*   No  results found for this basename: AMMONIA,  in the last 168 hours Coagulation profile  Recent Labs Lab 08/30/13 1110 08/30/13 1117 08/31/13 0345 09/01/13 0419  INR Sent out for confirmation 4.4* 5.55* 2.02*  PROTIME Sent out for confirmation.  --   --   --     CBC:  Recent Labs Lab 08/30/13 1110 08/30/13 1210 08/31/13 0345 09/01/13 0419  WBC 2.0* 1.8* 0.7* 0.8*  NEUTROABS 0.2 Repeated and Verified* 0.1* 0.0*  --   HGB 10.8* 10.3* 8.9* 8.3*  HCT 34.1* 31.0* 26.9* 25.0*  MCV 86.7 83.3 84.1 83.3  PLT 225 188 156 166   CBG:  Recent Labs Lab 08/31/13 1211 08/31/13 1602 08/31/13 2115 09/01/13 0818 09/01/13 1143  GLUCAP 194* 127* 191* 162* 117*   D-Dimer No results found for this basename: DDIMER,  in the last 72 hours Hgb A1c  Recent Labs  08/30/13 1210  HGBA1C 5.8*   Microbiology Recent Results (from the past 240 hour(s))  CULTURE, BLOOD (ROUTINE X 2)     Status: None   Collection Time  08/30/13 12:35 PM      Result Value Ref Range Status   Specimen Description BLOOD RIGHT PORTA CATH   Final   Special Requests BOTTLES DRAWN AEROBIC AND ANAEROBIC 5 CC EA   Final   Culture  Setup Time     Final   Value: 08/30/2013 17:07     Performed at [facility]   Culture     Final   Value:        BLOOD CULTURE RECEIVED NO GROWTH TO DATE CULTURE WILL BE HELD FOR 5 DAYS BEFORE ISSUING A FINAL NEGATIVE REPORT     Performed at [facility]   Report Status PENDING   Incomplete  CULTURE, BLOOD (ROUTINE X 2)     Status: None   Collection Time    08/30/13  1:24 PM      Result Value Ref Range Status   Specimen Description BLOOD LEFT ANTECUBITAL   Final   Special Requests BOTTLES DRAWN AEROBIC AND ANAEROBIC 5CC   Final   Culture  Setup Time     Final   Value: 08/30/2013 17:07     Performed at [facility]   Culture     Final   Value:        BLOOD CULTURE RECEIVED NO GROWTH TO DATE CULTURE WILL BE HELD FOR 5 DAYS BEFORE ISSUING A FINAL NEGATIVE  REPORT     Performed at [facility]   Report Status PENDING   Incomplete  URINE CULTURE     Status: None   Collection Time    08/30/13  1:25 PM      Result Value Ref Range Status   Specimen Description URINE, CATHETERIZED   Final   Special Requests NONE   Final   Culture  Setup Time     Final   Value: 08/31/2013 01:47     Performed at [place]     Final   Value: >=100,000 COLONIES/ML     Performed at [facility]   Culture     Final   Value: ENTEROCOCCUS SPECIES     Performed at [facility]   Report Status PENDING   Incomplete  MRSA PCR SCREENING     Status: None   Collection Time    08/30/13  5:08 PM      Result Value Ref Range Status   MRSA by PCR NEGATIVE  NEGATIVE Final   Comment:            The GeneXpert MRSA Assay (FDA     approved for NASAL specimens     only), is one component of a     comprehensive MRSA colonization     surveillance program. It is not     intended to diagnose MRSA     infection nor to guide or     monitor treatment for     MRSA infections.      Studies:  US Renal  08/30/2013   CLINICAL DATA:  Flank pain.  EXAM: RENAL/URINARY TRACT ULTRASOUND COMPLETE  COMPARISON:  CT 06/05/2013.  FINDINGS: Right Kidney:  Length: 9.7 cm. Ureteral stent noted. Echogenicity within normal limits. No mass or hydronephrosis visualized.  Left Kidney:  Length: 9.9 cm. Echogenicity within normal limits. No mass or hydronephrosis visualized.  Bladder:  Bladder is nondistended. Bladder wall is thickened to approximately 9 mm.  IMPRESSION: 1. Right ureteral stent noted.  No hydronephrosis. 2. Bladder nondistended.  Bladder wall thickening  is present.   Electronically Signed   By: [place]   On: 08/30/2013 15:03   Dg Chest Port 1 View  08/30/2013   CLINICAL DATA:  Nausea, emesis, diarrhea.  History of COPD.  EXAM: PORTABLE CHEST - 1 VIEW  COMPARISON:  Chest radiograph 06/26/2013 and CT chest abdomen pelvis 06/05/2013   FINDINGS: Right subclavian power port with the distal tip projecting over the superior vena cava. Left chest wall dual lead pacemaker appears stable. Cardiac leads project over the chest. Cardiomegaly appears similar to recent prior chest radiograph. Pulmonary vascularity is upper normal. The lungs appear clear. No airspace disease, effusion, or pneumothorax identified.  IMPRESSION: Mild cardiomegaly. Pulmonary vascularity is upper normal and the lungs appear clear.   Electronically Signed   By: Curlene Dolphin M.D.   On: 08/30/2013 14:43    Assessment/Plan: 73 y.o.  1. AMS secondary to #2, #3, #4.  --She appears non-toxic.   Please continue antibiotics and fluids.  Urine cultures with enterococcus.     2. Acute of Chronic Renal Failure.  --Ultrasound negative for chronic appearing changes or hydronephrosis.  Agree probable ATN secondary dehydration. Appreciate Renal. Creatinine slowly improving.   3. Hypercalcemia, resolved.  --Likely secondary to #4. Patient is not a candidate for bisphophonates secondary to #2. Hydration and low dose steroids. Calcium improved with hydration. Calcium 8.5 today.   4. 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  --HOLD further chemotherapy in the setting of above.   5. S/P R. Hydronephrosis, s/p R ureteral stent (04/03)  --Patient had R ureteral stent placed on 04/03.   6. Neutropenia.  -- Could be secondary to #4 with bone marrow involvement versus revlimid. We will start neupogen daily. Continue until [place] is greater than 1,500 of two consecutive lab values. Neutropenic precautions.   7. Anemia secondary to #4 or chemotherapy.  --Please try maintain a hemoglobin greater than 8.   8. Hypothyroidism: Levothyroxine.  9. Hypertension: Hold in the setting of possible sepsis.  10. Diabetes mellitus, type II: Metformin per PCP. Hold per renal  function.  11. Hyperlipidemia: Pravastatin per PCP.  12.History of atrial fibrillation: Diltiazem, pacemaker. Coumadin being managed per her PCP previously.  13. COPD/ Chronic Bronchitis exacerbation.  --Patient advised to continue albuterol q 6 hours as needed.  14. Disposition. HCPOA are her neices and sister.  Full Code      Concha Norway, MD 09/01/2013  1:11 PM

## 2013-09-01 NOTE — Progress Notes (Signed)
[facility] for Warfarin Indication: atrial fibrillation  Allergies  Allergen Reactions  . Codeine Nausea Only  . Penicillins     Unknown    Patient Measurements: Height: 4\' 11"  (149.9 cm) Weight: 208 lb 12.4 oz (94.7 kg) IBW/kg (Calculated) : 43.2  Vital Signs: Temp: 97.7 F (36.5 C) (05/22 0500) Temp src: Oral (05/22 0500) BP: 105/41 mmHg (05/22 0400) Pulse Rate: 87 (05/22 0500)  Labs:  Recent Labs  08/30/13 1117  08/30/13 1210 08/30/13 2000 08/31/13 0345 09/01/13 0419  HGB  --   < > 10.3*  --  8.9* 8.3*  HCT  --   --  31.0*  --  26.9* 25.0*  PLT  --   --  188  --  156 166  LABPROT 40.3*  --   --   --  48.1* 22.2*  INR 4.4*  --   --   --  5.55* 2.02*  CREATININE  --   < > 7.59* 7.37* 7.19* 6.50*  < > = values in this interval not displayed.  Estimated Creatinine Clearance: 7.9 ml/min (by C-G formula based on Cr of 6.5).   Medical History: Past Medical History  Diagnosis Date  . Sick sinus syndrome   . Hematoma     At the site of the pacemaker insertion.  . Hypotension   . Dyslipidemia   . History of atrial fibrillation   . Arthritis     Osteoarthritis  . Port-a-cath in place 12/08/10  . Status post chemotherapy completed 03/2011    R - CHOP q 3 weeks x 6  . S/P radiation therapy 05/28/2011 - 06/26/2011    Right Abdomen and Right Pelvis/3060 cGy in 17 Fractions  . Hypertension   . ASHD (arteriosclerotic heart disease)   . Gout   . COPD (chronic obstructive pulmonary disease)   . Asthma   . Hyperlipidemia   . Diabetes mellitus   . Hypothyroidism   . Diffuse large B cell lymphoma     Dr. Chisom,oncology  . Cancer 10/2010    large B cell lymphoma  . 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 Pacemaker     left chest  . Dysrhythmia     Hx. A. Fib, sick sinus syndrome- Pacemaker inserted.  . Cataracts, bilateral     Medications:  Scheduled:  . dexamethasone  2 mg Oral BID  WC  . imipenem-cilastatin  250 mg Intravenous Q12H  . insulin aspart  0-9 Units Subcutaneous TID WC  . levothyroxine  50 mcg Oral QAC breakfast  . pravastatin  40 mg Oral q1800  . Tbo-Filgrastim  300 mcg Subcutaneous q1800  . tiotropium  18 mcg Inhalation Daily  . Warfarin - Pharmacist Dosing Inpatient   Does not apply q1800   Infusions:  . sodium chloride 150 mL/hr at 09/01/13 0400  . senna-docusate      Assessment: [name] admitted 5/20 with sepsis on chronic warfarin prior to admission for history of atrial fibrillation.  PMH also includes: UTI, recurrent diffuse B cell lymphoma, CKD, COPD, DM, neutropenia, anemia.  Pharmacy is consulted to continue warfarin dosing inpatient.  Supratherapeutic INR of 4.4 on admission.  PTA Warfarin 3 mg daily except 6 mg on Saturdays (per 08/18/13 anticoag clinic visit, recent dose reduction), last dose on 5/19  INR 2.02 today, dropped significantly from 5.55 yesterday after holding doses x 2 days and 1 mg IV Vitamin K administration (5/21).  SCr 6.5  CBC: Hgb 8.3,, Plt 166.   No bleeding reported.   Goal of Therapy:  INR 2-3 Monitor platelets by anticoagulation protocol: Yes   Plan:  1.  Warfarin 3 mg once today. 2.  Follow up INR in AM.    Hershal Coria, PharmD, BCPS Pager: [phone] 09/01/2013 7:53 AM

## 2013-09-01 NOTE — Progress Notes (Signed)
[facility] Progress Note   Subjective: More alert, making urine but no foley in so has not been recorded. Creat no change mid 7's  Filed Vitals:   09/01/13 1000 09/01/13 1025 09/01/13 1100 09/01/13 1200  BP:  118/62  113/49  Pulse: 80 82 84 87  Temp:    98.6 F (37 C)  TempSrc:    Oral  Resp: 15 16 17 20   Height:      Weight:      SpO2: 95%  96% 97%   Exam: Elderly obese, pleasant, disoriented partially  No jvd  Chest clear bilat  RRR no MRG  Abd very obese, soft, dec'd BS, nontender, no hsm  Ext no LE edema, no ulcer or gangrene  Neuro is nf, mild gen weakness, no asterixis   UA 100 protein, 21-50wbc, 11-20 rbc, many bact, mod LE, nitrite neg  CXR mild CM, clear lungs  Renal US - R ureteral stent in place, no hydro, 9.7/9.9 cm kidneys   Date    Creat   eGFR   CKD  2012    0.9- 1.1  2013    0.9- 1.0  54- 58   Stage III  Jan-Jun 2014   1.0- 1.2  July- Dec 2014  1.0- 1.19 Apr 2013   1.1- 1.3  40- 48   Stage III  Mar 2015   1.57- 1.92  25- 32   Stage IV  Jul 14 2013   1.76  Jul 19 2013   2.10  Jul 24 2013   1.8  Aug 09 2013   2.2   20   Stage IV  Aug 30 2013   7.59   5     Assessment:  1 Acute on CRF- d/t vol depletion and hypotension 2 CKD stage IV- has been losing renal function over the past year or so with eGFR of 20 mL/min in April  3 Vol depletion- improving  4 Hypotension- persists 5 Lymphoma f/b oncology  6 Hx afib- in NSR on diltiazem but BP's are low  10 Hx R ureteral stent- in place by [place] today and no hydro   Plan- tolerating fluids so far and still has room for volume. Cont IVF"s. BP too low for diltiazem, have dc'd. Pt has poor renal function acutely but also her baseline is not great as noted above and renal prognosis is definitely guarded overall. At first glance she appears to be a poor candidate for consideration of long-term dialysis.    Kelly Splinter MD  pager [phone]    cell [phone]  08/31/2013, 5:20 PM     Recent  Labs Lab 08/30/13 2000 08/31/13 0345 09/01/13 0419  NA 134* 133* 134*  K 4.6 4.7 3.8  CL 98 99 102  CO2 16* 16* 15*  GLUCOSE 111* 113* 166*  BUN 84* 80* 78*  CREATININE 7.37* 7.19* 6.50*  CALCIUM 10.5 9.7 8.5    Recent Labs Lab 08/30/13 1111 08/30/13 1210 08/31/13 0345  AST 24 24 22   ALT 19 17 16   ALKPHOS 71 68 57  BILITOT 0.36 0.2* <0.2*  PROT 6.6 6.6 5.5*  ALBUMIN 3.4* 3.5 2.9*    Recent Labs Lab 08/30/13 1110 08/30/13 1210 08/31/13 0345 09/01/13 0419  WBC 2.0* 1.8* 0.7* 0.8*  NEUTROABS 0.2 Repeated and Verified* 0.1* 0.0*  --   HGB 10.8* 10.3* 8.9* 8.3*  HCT 34.1* 31.0* 26.9* 25.0*  MCV 86.7 83.3 84.1 83.3  PLT 225 188 156 166   .  dexamethasone  2 mg Oral BID WC  . imipenem-cilastatin  250 mg Intravenous Q12H  . insulin aspart  0-9 Units Subcutaneous TID WC  . levothyroxine  50 mcg Oral QAC breakfast  . pravastatin  40 mg Oral q1800  . Tbo-Filgrastim  300 mcg Subcutaneous q1800  . tiotropium  18 mcg Inhalation Daily  . [START ON 09/02/2013] vancomycin  1,000 mg Intravenous Q48H  . warfarin  3 mg Oral ONCE-1800  . Warfarin - Pharmacist Dosing Inpatient   Does not apply q1800   . sodium chloride 150 mL/hr at 09/01/13 0400  . senna-docusate     albuterol, ondansetron, polyethylene glycol, senna-docusate

## 2013-09-01 NOTE — Plan of Care (Signed)
Pt remains mildly confused with delayed ability to answer questions at times. Blood pressure soft overnight but now appears to be resolving after a 500cc bolus of NS. Vital signs otherwise stable. No additional changes made to plan of care. Please refer to charting for further assessments.   Patient reports speaking with son but no family has been to the bedside or called to receive an update,  Will continue to monitor.

## 2013-09-01 NOTE — Plan of Care (Signed)
500cc bolus order written by Lamar Blinks, will recheck pressure after bolus infuses.

## 2013-09-01 NOTE — Telephone Encounter (Signed)
lvm for pt regarding to May and june appt....mailed pt avs and letter

## 2013-09-01 NOTE — Progress Notes (Signed)
[facility] Progress Note   Subjective: Good UOP, foley in , creat down 6.50  Filed Vitals:   09/01/13 1000 09/01/13 1025 09/01/13 1100 09/01/13 1200  BP:  118/62  113/49  Pulse: 80 82 84 87  Temp:    98.6 F (37 C)  TempSrc:    Oral  Resp: 15 16 17 20   Height:      Weight:      SpO2: 95%  96% 97%   Exam: Elderly obese, no distress No jvd  Chest clear bilat  RRR no MRG  Abd very obese, soft, dec'd BS, nontender, no hsm  Ext no LE edema, no ulcer or gangrene  Neuro is nf, mild gen weakness   UA 100 protein, 21-50wbc, 11-20 rbc, many bact, mod LE, nitrite neg  CXR mild CM, clear lungs  Renal US - R ureteral stent in place, no hydro, 9.7/9.9 cm kidneys   Date    Creat   eGFR   CKD  2012    0.9- 1.1  2013    0.9- 1.0  54- 58   Stage III  Jan-Jun 2014   1.0- 1.2  July- Dec 2014  1.0- 1.19 Apr 2013   1.1- 1.3  40- 48   Stage III  Mar 2015   1.57- 1.92  25- 32   Stage IV  Jul 14 2013   1.76  Jul 19 2013   2.10  Jul 24 2013   1.8  Aug 09 2013   2.2   20   Stage IV  Aug 30 2013   7.59   5     Assessment:  1 Acute on CRF- d/t vol depletion and hypotension; improving.  2 CKD stage IV- has been losing renal function over the past year or so with eGFR of 20 mL/min in April. She rec'd cisplatinum during the second half of 2014 as part of her second round of chemotherapy. 3 Vol depletion- better 4 Hypotension- better 5 Lymphoma f/b oncology  6 Hx afib- holding po diltiazem due to low BP's  10 Hx R ureteral stent- in place by [place] and no hydro   Plan- cont IVF's, labs in am    Kelly Splinter MD  pager [phone]    cell [phone]  08/31/2013, 5:20 PM     Recent Labs Lab 08/30/13 2000 08/31/13 0345 09/01/13 0419  NA 134* 133* 134*  K 4.6 4.7 3.8  CL 98 99 102  CO2 16* 16* 15*  GLUCOSE 111* 113* 166*  BUN 84* 80* 78*  CREATININE 7.37* 7.19* 6.50*  CALCIUM 10.5 9.7 8.5    Recent Labs Lab 08/30/13 1111 08/30/13 1210 08/31/13 0345  AST 24 24 22    ALT 19 17 16   ALKPHOS 71 68 57  BILITOT 0.36 0.2* <0.2*  PROT 6.6 6.6 5.5*  ALBUMIN 3.4* 3.5 2.9*    Recent Labs Lab 08/30/13 1110 08/30/13 1210 08/31/13 0345 09/01/13 0419  WBC 2.0* 1.8* 0.7* 0.8*  NEUTROABS 0.2 Repeated and Verified* 0.1* 0.0*  --   HGB 10.8* 10.3* 8.9* 8.3*  HCT 34.1* 31.0* 26.9* 25.0*  MCV 86.7 83.3 84.1 83.3  PLT 225 188 156 166   . dexamethasone  2 mg Oral BID WC  . imipenem-cilastatin  250 mg Intravenous Q12H  . insulin aspart  0-9 Units Subcutaneous TID WC  . levothyroxine  50 mcg Oral QAC breakfast  . pravastatin  40 mg Oral q1800  . Tbo-Filgrastim  300 mcg Subcutaneous q1800  .  tiotropium  18 mcg Inhalation Daily  . [START ON 09/02/2013] vancomycin  1,000 mg Intravenous Q48H  . warfarin  3 mg Oral ONCE-1800  . Warfarin - Pharmacist Dosing Inpatient   Does not apply q1800   . sodium chloride 150 mL/hr at 09/01/13 0400  . senna-docusate     albuterol, ondansetron, polyethylene glycol, senna-docusate

## 2013-09-01 NOTE — Plan of Care (Signed)
K Schorr paged for low blood pressure, 78/34 with automatic cuff. Rechecked with manual cuff and found to be 90/40. Pulses and capillary refill appropriate. No change in mentation. Pt asymptomatic. Awaiting return page.

## 2013-09-01 NOTE — Progress Notes (Signed)
CRITICAL VALUE ALERT  Critical value received:  WBC 0.8  Date of notification:  09/01/13  Time of notification:  04:58  Critical value read back:yes  Nurse who received alert:  B Carilyn Woolston RN   MD notified (1st page):  K Schorr   Time of first page:  0500  MD notified (2nd page):  Time of second page:  Responding MD:   Time MD responded:  Has not responded ([id number])

## 2013-09-02 LAB — GLUCOSE, CAPILLARY
Glucose-Capillary: 119 mg/dL — ABNORMAL HIGH (ref 70–99)
Glucose-Capillary: 180 mg/dL — ABNORMAL HIGH (ref 70–99)
Glucose-Capillary: 122 mg/dL — ABNORMAL HIGH (ref 70–99)
Glucose-Capillary: 151 mg/dL — ABNORMAL HIGH (ref 70–99)

## 2013-09-02 LAB — BASIC METABOLIC PANEL
BUN: 72 mg/dL — ABNORMAL HIGH (ref 6–23)
CALCIUM: 8.4 mg/dL (ref 8.4–10.5)
CO2: 15 mEq/L — ABNORMAL LOW (ref 19–32)
Chloride: 106 mEq/L (ref 96–112)
Creatinine, Ser: 5.39 mg/dL — ABNORMAL HIGH (ref 0.50–1.10)
GFR, EST AFRICAN AMERICAN: 8 mL/min — AB (ref >90)
GFR, EST NON AFRICAN AMERICAN: 7 mL/min — AB (ref >90)
GLUCOSE: 138 mg/dL — AB (ref 70–99)
Potassium: 3.7 mEq/L (ref 3.7–5.3)
Sodium: 138 mEq/L (ref 137–147)

## 2013-09-02 LAB — CBC
HCT: 23.7 % — ABNORMAL LOW (ref 36.0–46.0)
Hemoglobin: 7.8 g/dL — ABNORMAL LOW (ref 12.0–15.0)
MCH: 27.9 pg (ref 26.0–34.0)
MCHC: 32.9 g/dL (ref 30.0–36.0)
MCV: 84.6 fL (ref 78.0–100.0)
Platelets: 139 10*3/uL — ABNORMAL LOW (ref 150–400)
RBC: 2.8 MIL/uL — ABNORMAL LOW (ref 3.87–5.11)
RDW: 16.7 % — ABNORMAL HIGH (ref 11.5–15.5)
WBC: 1.2 10*3/uL — CL (ref 4.0–10.5)

## 2013-09-02 LAB — PROTIME-INR
INR: 2.04 — AB (ref 0.00–1.49)
Prothrombin Time: 22.4 seconds — ABNORMAL HIGH (ref 11.6–15.2)

## 2013-09-02 MED ORDER — WARFARIN SODIUM 3 MG PO TABS
3.0000 mg | ORAL_TABLET | Freq: Once | ORAL | Status: AC
  Administered 2013-09-02: 3 mg via ORAL
  Filled 2013-09-02: qty 1

## 2013-09-02 NOTE — Progress Notes (Signed)
[name] for Warfarin Indication: atrial fibrillation  Allergies  Allergen Reactions  . Codeine Nausea Only  . Penicillins     Unknown    Patient Measurements: Height: 4\' 11"  (149.9 cm) Weight: 214 lb 15.2 oz (97.5 kg) IBW/kg (Calculated) : 43.2  Vital Signs: Temp: 97.7 F (36.5 C) (05/23 1200) Temp src: Oral (05/23 1200) BP: 108/46 mmHg (05/23 0600) Pulse Rate: 82 (05/23 0600)  Labs:  Recent Labs  08/31/13 0345 09/01/13 0419 09/02/13 0500  HGB 8.9* 8.3* 7.8*  HCT 26.9* 25.0* 23.7*  PLT 156 166 139*  LABPROT 48.1* 22.2* 22.4*  INR 5.55* 2.02* 2.04*  CREATININE 7.19* 6.50* 5.39*    Estimated Creatinine Clearance: 9.7 ml/min (by C-G formula based on Cr of 5.39).   [name] admitted 5/20 with sepsis on chronic warfarin prior to admission for history of atrial fibrillation.  PMH also includes: UTI, recurrent diffuse B cell lymphoma, CKD, COPD, DM, neutropenia, anemia.  Pharmacy is consulted to continue warfarin dosing inpatient.  Supratherapeutic INR of 4.4 on admission.  PTA Warfarin 3 mg daily except 6 mg on Saturdays (per 08/18/13 anticoag clinic visit, recent dose reduction), last dose on 5/19  INR 2.04 today, dropped significantly on 5/22 from 5.55 on 5/21 after holding doses x 2 days and 1 mg IV Vitamin K administration (5/21).  SCr 5.39, slowly improving, Renal following  CBC: Hgb down slightly to 7.8, Plt down slightly to 139   No bleeding reported.   Goal of Therapy:  INR 2-3 Monitor platelets by anticoagulation protocol: Yes   Plan:  1.  Warfarin 3 mg once today. 2.  Follow up INR in AM.   Thank you for the consult.  [name], PharmD, BCPS Pager: [phone] Pharmacy: [phone] 09/02/2013 1:07 PM

## 2013-09-02 NOTE — Progress Notes (Signed)
[facility] Progress Note   Subjective: Good UOP, foley in , creat down 5.39, 4.3L in and 2.6L out. No new complaints   Filed Vitals:   09/02/13 1024 09/02/13 1200 09/02/13 1600 09/02/13 1700  BP:  102/36 146/88 136/59  Pulse:  84 84 83  Temp:  97.7 F (36.5 C) 97.4 F (36.3 C) 98.3 F (36.8 C)  TempSrc:  Oral Oral Oral  Resp:  17 23 17  Height:      Weight:      SpO2: 95% 95% 98% 98%   Exam: Elderly obese, no distress No jvd  Chest clear bilat  RRR no MRG  Abd very obese, soft, dec'd BS, nontender, no hsm  Ext mild pretib edema, no ulcer or gangrene  Neuro is nf, mild gen weakness   UA 100 protein, 21-50wbc, 11-20 rbc, many bact, mod LE, nitrite neg UCx +enterococcus resistant to ampicillin CXR mild CM, clear lungs  Renal US - R ureteral stent in place, no hydro, 9.7/9.9 cm kidneys   Date    Creat   eGFR   CKD  2012    0.9- 1.1  2013    0.9- 1.0  54- 58   Stage III  Jan-Jun 2014   1.0- 1.2  July- Dec 2014  1.0- 1.19 Apr 2013   1.1- 1.3  40- 48   Stage III  Mar 2015   1.57- 1.92  25- 32   Stage IV  Jul 14 2013   1.76  Jul 19 2013   2.10  Jul 24 2013   1.8  Aug 09 2013   2.2   20   Stage IV  Aug 30 2013   7.59   5     Assessment:  1 Acute on CRF- d/t GI illness with diarrhea, vol depletion and hypotension; improving  2 CKD stage IV- baseline creat 1.7-2.1 from April; has been losing renal function over the past year or so. She rec'd cisplatinum during the second half of 2014 as part of her second round of chemotherapy 3 Vol depletion- resolved 4 Hypotension- resolved 5 Lymphoma f/b oncology  6 Hx afib- holding po diltiazem due to low BP's  10 Hx R ureteral stent for hydronephrosis- stent is in place by US and no hydro   Plan- decrease IVF to 40/hr and stop soon, check creat in am. Would prefer alternative to vancomycin for the enterococcal UTI, for example cubicin, to avoid the potential for vanc nephrotoxicity.     Rob  MD  pager  [phone]    cell [phone]  08/31/2013, 5:20 PM     Recent Labs Lab 08/31/13 0345 09/01/13 0419 09/02/13 0500  NA 133* 134* 138  K 4.7 3.8 3.7  CL 99 102 106  CO2 16* 15* 15*  GLUCOSE 113* 166* 138*  BUN 80* 78* 72*  CREATININE 7.19* 6.50* 5.39*  CALCIUM 9.7 8.5 8.4    Recent Labs Lab 08/30/13 1111 08/30/13 1210 08/31/13 0345  AST 24 24 22  ALT 19 17 16  ALKPHOS 71 68 57  BILITOT 0.36 0.2* <0.2*  PROT 6.6 6.6 5.5*  ALBUMIN 3.4* 3.5 2.9*    Recent Labs Lab 08/30/13 1110  08/30/13 1210 08/31/13 0345 09/01/13 0419 09/02/13 0500  WBC 2.0*  < > 1.8* 0.7* 0.8* 1.2*  NEUTROABS 0.2 Repeated and Verified*  --  0.1* 0.0*  --   --   HGB 10.8*  < > 10.3* 8.9* 8.3* 7.8*  HCT 34.1*  < >   31.0* 26.9* 25.0* 23.7*  MCV 86.7  < > 83.3 84.1 83.3 84.6  PLT 225  < > 188 156 166 139*  < > = values in this interval not displayed. Marland Kitchen dexamethasone  2 mg Oral BID WC  . insulin aspart  0-9 Units Subcutaneous TID WC  . levothyroxine  50 mcg Oral QAC breakfast  . pravastatin  40 mg Oral q1800  . Tbo-Filgrastim  300 mcg Subcutaneous q1800  . tiotropium  18 mcg Inhalation Daily  . vancomycin  1,000 mg Intravenous Q48H  . Warfarin - Pharmacist Dosing Inpatient   Does not apply q1800   . sodium chloride 150 mL/hr at 09/02/13 0207  . senna-docusate     albuterol, ondansetron, polyethylene glycol, senna-docusate

## 2013-09-02 NOTE — Progress Notes (Signed)
PROGRESS NOTE   [name] IRC:[id number] DOB: Aug 25, 1940 DOA: 08/30/2013 PCP: [name], MD  Assessment/Plan:  Severe sepsis with evidence of renal failure and lactic acidosis  - blood pressure improved, continue IVF, move out of SDU today - continue Vancomycin, d/c Primaxin - blood cultures negative, pending - enterococcus in the urine, sensitive to Vancomycin. UTI - continue Vanc Recurrent diffuse B cell lymphoma - followed as an outpatient by Dr. [name]  Acute renal failure on CKD stage III - multifactorial, ?chemo induced, mild dehydration, Urinary infection  - no hydronephrosis on [place]  - nephrology consulted, appreciate input  - avoid nephrotoxic agents  - slowly improving  A fib with RVR - not in A fib currently, paced. Continue Coumadin and Cardizem  COPD - no wheezing or evidence of decompensation. Continue home medications  Hyperkalemia - due to renal failure. Kayexalate.  DM - stop metformin, SSI. A1C 5.8 Neutropenia - chemotherapy induced. Monitor. Neupogen per oncology.  Anemia - multifactorial, of chronic disease, on Revlemid. No evidence of bleeding, monitor.  Supratherapeutc INR - Coumadin per pharmacy, now within expected levels. Acute encephalopathy - in the setting of #1, 2, 4 SSS s/p PCM    Diet: Renal Fluids: NS DVT Prophylaxis: Coumadin  Code Status: Full Family Communication: d/w patient and son Aaron Edelman over the phone [phone]) Disposition Plan: inpatient  Consultants:  Nephrology   Oncology   Procedures:  none   Antibiotics Vancomycin 5/20 >> Primaxin 5/20 >> 5/23  HPI/Subjective: - no complaints, denies shortness of breath   Objective: Filed Vitals:   09/02/13 0000 09/02/13 0200 09/02/13 0400 09/02/13 0600  BP: 97/34 100/36 116/48 108/46  Pulse: 82 82 82 82  Temp: 97.9 F (36.6 C)  98.4 F (36.9 C)   TempSrc: Oral  Oral   Resp: 16 15 14 13   Height:      Weight:   97.5 kg (214 lb 15.2 oz)   SpO2: 94% 95% 95%  95%    Intake/Output Summary (Last 24 hours) at 09/02/13 0709 Last data filed at 09/02/13 0600  Gross per 24 hour  Intake   4150 ml  Output   2650 ml  Net   1500 ml   Filed Weights   08/31/13 1011 09/01/13 0500 09/02/13 0400  Weight: 92.3 kg (203 lb 7.8 oz) 94.7 kg (208 lb 12.4 oz) 97.5 kg (214 lb 15.2 oz)   Exam:  General:  NAD  Cardiovascular: regular rate and rhythm, without MRG  Respiratory: good air movement, clear to auscultation throughout, no wheezing, ronchi or rales  Abdomen: soft, not tender to palpation, positive bowel sounds  MSK: no peripheral edema  Neuro: non focal  Data Reviewed: Basic Metabolic Panel:  Recent Labs Lab 08/30/13 1210 08/30/13 2000 08/31/13 0345 09/01/13 0419 09/02/13 0500  NA 130* 134* 133* 134* 138  K 5.7* 4.6 4.7 3.8 3.7  CL 92* 98 99 102 106  CO2 14* 16* 16* 15* 15*  GLUCOSE 91 111* 113* 166* 138*  BUN 85* 84* 80* 78* 72*  CREATININE 7.59* 7.37* 7.19* 6.50* 5.39*  CALCIUM 11.5* 10.5 9.7 8.5 8.4   Liver Function Tests:  Recent Labs Lab 08/30/13 1111 08/30/13 1210 08/31/13 0345  AST 24 24 22   ALT 19 17 16   ALKPHOS 71 68 57  BILITOT 0.36 0.2* <0.2*  PROT 6.6 6.6 5.5*  ALBUMIN 3.4* 3.5 2.9*    Recent Labs Lab 08/30/13 1210  LIPASE 72*   CBC:  Recent Labs Lab 08/30/13 1110 08/30/13  1210 08/31/13 0345 09/01/13 0419 09/02/13 0500  WBC 2.0* 1.8* 0.7* 0.8* 1.2*  NEUTROABS 0.2 Repeated and Verified* 0.1* 0.0*  --   --   HGB 10.8* 10.3* 8.9* 8.3* 7.8*  HCT 34.1* 31.0* 26.9* 25.0* 23.7*  MCV 86.7 83.3 84.1 83.3 84.6  PLT 225 188 156 166 139*   Cardiac Enzymes: No results found for this basename: CKTOTAL, CKMB, CKMBINDEX, TROPONINI,  in the last 168 hours BNP (last 3 results) No results found for this basename: PROBNP,  in the last 8760 hours CBG:  Recent Labs Lab 08/31/13 2115 09/01/13 0818 09/01/13 1143 09/01/13 1627 09/01/13 2122  GLUCAP 191* 162* 117* 124* 165*    Recent Results (from the  past 240 hour(s))  CULTURE, BLOOD (ROUTINE X 2)     Status: None   Collection Time    08/30/13 12:35 PM      Result Value Ref Range Status   Specimen Description BLOOD RIGHT PORTA CATH   Final   Special Requests BOTTLES DRAWN AEROBIC AND ANAEROBIC 5 CC EA   Final   Culture  Setup Time     Final   Value: 08/30/2013 17:07     Performed at [facility]   Culture     Final   Value:        BLOOD CULTURE RECEIVED NO GROWTH TO DATE CULTURE WILL BE HELD FOR 5 DAYS BEFORE ISSUING A FINAL NEGATIVE REPORT     Performed at [facility]   Report Status PENDING   Incomplete  CULTURE, BLOOD (ROUTINE X 2)     Status: None   Collection Time    08/30/13  1:24 PM      Result Value Ref Range Status   Specimen Description BLOOD LEFT ANTECUBITAL   Final   Special Requests BOTTLES DRAWN AEROBIC AND ANAEROBIC 5CC   Final   Culture  Setup Time     Final   Value: 08/30/2013 17:07     Performed at [facility]   Culture     Final   Value:        BLOOD CULTURE RECEIVED NO GROWTH TO DATE CULTURE WILL BE HELD FOR 5 DAYS BEFORE ISSUING A FINAL NEGATIVE REPORT     Performed at [facility]   Report Status PENDING   Incomplete  URINE CULTURE     Status: None   Collection Time    08/30/13  1:25 PM      Result Value Ref Range Status   Specimen Description URINE, CATHETERIZED   Final   Special Requests NONE   Final   Culture  Setup Time     Final   Value: 08/31/2013 01:47     Performed at [facility]     Final   Value: >=100,000 COLONIES/ML     Performed at [facility]   Culture     Final   Value: ENTEROCOCCUS SPECIES     Performed at [facility]   Report Status 09/01/2013 FINAL   Final   Organism ID, Bacteria ENTEROCOCCUS SPECIES   Final  MRSA PCR SCREENING     Status: None   Collection Time    08/30/13  5:08 PM      Result Value Ref Range Status   MRSA by PCR NEGATIVE  NEGATIVE Final   Comment:            The GeneXpert MRSA  Assay (FDA  approved for NASAL specimens     only), is one component of a     comprehensive MRSA colonization     surveillance program. It is not     intended to diagnose MRSA     infection nor to guide or     monitor treatment for     MRSA infections.     Studies: No results found.  Scheduled Meds: . dexamethasone  2 mg Oral BID WC  . imipenem-cilastatin  250 mg Intravenous Q12H  . insulin aspart  0-9 Units Subcutaneous TID WC  . levothyroxine  50 mcg Oral QAC breakfast  . pravastatin  40 mg Oral q1800  . Tbo-Filgrastim  300 mcg Subcutaneous q1800  . tiotropium  18 mcg Inhalation Daily  . vancomycin  1,000 mg Intravenous Q48H  . Warfarin - Pharmacist Dosing Inpatient   Does not apply q1800   Continuous Infusions: . sodium chloride 150 mL/hr at 09/02/13 0207  . senna-docusate      Active Problems:   Cardiac pacemaker in situ   Diffuse large B cell lymphoma   Hypothyroidism   Diabetes mellitus type 2 in obese   Hyperlipidemia   Antineoplastic chemotherapy induced pancytopenia   Acute renal failure   COPD (chronic obstructive pulmonary disease)   UTI (lower urinary tract infection)   Severe sepsis   Sepsis secondary to UTI   Generalized weakness   Acute on chronic renal failure   CKD (chronic kidney disease) stage 3, GFR 30-59 ml/min   Acute encephalopathy   Neutropenia  Time spent: 25  This note has been created with Surveyor, quantity. Any transcriptional errors are unintentional.   Marzetta Board, MD [facility] Pager [phone]. If 7 PM - 7 AM, please contact night-coverage at [email or web address], password [facility] 09/02/2013, 7:09 AM  LOS: 3 days

## 2013-09-03 LAB — CBC WITH DIFFERENTIAL/PLATELET
BASOS PCT: 0 % (ref 0–1)
Basophils Absolute: 0 10*3/uL (ref 0.0–0.1)
EOS PCT: 1 % (ref 0–5)
Eosinophils Absolute: 0 10*3/uL (ref 0.0–0.7)
HEMATOCRIT: 25.4 % — AB (ref 36.0–46.0)
HEMOGLOBIN: 8.5 g/dL — AB (ref 12.0–15.0)
LYMPHS PCT: 13 % (ref 12–46)
Lymphs Abs: 0.4 10*3/uL — ABNORMAL LOW (ref 0.7–4.0)
MCH: 27.9 pg (ref 26.0–34.0)
MCHC: 33.5 g/dL (ref 30.0–36.0)
MCV: 83.3 fL (ref 78.0–100.0)
Monocytes Absolute: 0.7 10*3/uL (ref 0.1–1.0)
Monocytes Relative: 21 % — ABNORMAL HIGH (ref 3–12)
NEUTROS ABS: 2.1 10*3/uL (ref 1.7–7.7)
Neutrophils Relative %: 65 % (ref 43–77)
Platelets: 151 10*3/uL (ref 150–400)
RBC: 3.05 MIL/uL — AB (ref 3.87–5.11)
RDW: 16.4 % — ABNORMAL HIGH (ref 11.5–15.5)
WBC: 3.2 10*3/uL — ABNORMAL LOW (ref 4.0–10.5)

## 2013-09-03 LAB — BASIC METABOLIC PANEL
BUN: 69 mg/dL — AB (ref 6–23)
CO2: 16 mEq/L — ABNORMAL LOW (ref 19–32)
Calcium: 8.9 mg/dL (ref 8.4–10.5)
Chloride: 105 mEq/L (ref 96–112)
Creatinine, Ser: 4.42 mg/dL — ABNORMAL HIGH (ref 0.50–1.10)
GFR calc Af Amer: 11 mL/min — ABNORMAL LOW (ref >90)
GFR calc non Af Amer: 9 mL/min — ABNORMAL LOW (ref >90)
GLUCOSE: 138 mg/dL — AB (ref 70–99)
Potassium: 3.4 mEq/L — ABNORMAL LOW (ref 3.7–5.3)
Sodium: 138 mEq/L (ref 137–147)

## 2013-09-03 LAB — PROTIME-INR
INR: 2.23 — AB (ref 0.00–1.49)
PROTHROMBIN TIME: 24 s — AB (ref 11.6–15.2)

## 2013-09-03 LAB — GLUCOSE, CAPILLARY
Glucose-Capillary: 134 mg/dL — ABNORMAL HIGH (ref 70–99)
Glucose-Capillary: 126 mg/dL — ABNORMAL HIGH (ref 70–99)
Glucose-Capillary: 187 mg/dL — ABNORMAL HIGH (ref 70–99)
Glucose-Capillary: 172 mg/dL — ABNORMAL HIGH (ref 70–99)

## 2013-09-03 MED ORDER — WARFARIN SODIUM 3 MG PO TABS
3.0000 mg | ORAL_TABLET | Freq: Once | ORAL | Status: AC
  Administered 2013-09-03: 3 mg via ORAL
  Filled 2013-09-03: qty 1

## 2013-09-03 MED ORDER — SODIUM CHLORIDE 0.9 % IV SOLN
400.0000 mg | INTRAVENOUS | Status: DC
  Administered 2013-09-03 – 2013-09-07 (×3): 400 mg via INTRAVENOUS
  Filled 2013-09-03 (×4): qty 8

## 2013-09-03 MED ORDER — SENNOSIDES-DOCUSATE SODIUM 8.6-50 MG PO TABS
2.0000 | ORAL_TABLET | Freq: Once | ORAL | Status: AC
  Administered 2013-09-03: 2 via ORAL
  Filled 2013-09-03: qty 2

## 2013-09-03 MED ORDER — SODIUM CHLORIDE 0.9 % IJ SOLN
10.0000 mL | INTRAMUSCULAR | Status: DC | PRN
  Administered 2013-09-03 – 2013-09-07 (×4): 10 mL

## 2013-09-03 NOTE — Progress Notes (Signed)
ANTIBIOTIC CONSULT NOTE - FOLLOW UP  Pharmacy Consult for vancomycin --> daptomycin Indication: sepsis/UTI  Allergies  Allergen Reactions  . Codeine Nausea Only  . Penicillins     Unknown    Patient Measurements: Height: 4\' 11"  (149.9 cm) Weight: 211 lb 10.3 oz (96 kg) IBW/kg (Calculated) : 43.2  Vital Signs: Temp: 97.7 F (36.5 C) (05/24 1350) Temp src: Oral (05/24 1350) BP: 121/53 mmHg (05/24 1350) Pulse Rate: 82 (05/24 1350) Intake/Output from previous day: 05/23 0701 - 05/24 0700 In: 1370 [P.O.:320; I.V.:1050] Out: 4850 [Urine:4850] Intake/Output from this shift: Total I/O In: 240 [P.O.:240] Out: 1300 [Urine:1300]  Labs:  Recent Labs  09/01/13 0419 09/02/13 0500 09/03/13 0347  WBC 0.8* 1.2* 3.2*  HGB 8.3* 7.8* 8.5*  PLT 166 139* 151  CREATININE 6.50* 5.39* 4.42*   Estimated Creatinine Clearance: 11.7 ml/min (by C-G formula based on Cr of 4.42).  Recent Labs  09/01/13 0420 09/01/13 1020  VANCORANDOM 23.6 21.9     Microbiology: 5/20 MRSA PCR: neg 5/20 blood x2: ngtd 5/20 urine: enterococcus species  Assessment: 73yo F with lymphoma, admitted from home via the cancer center d/t weakness, vomiting and diarrhea. Code Sepsis was activated and pharmacy is asked to dose Vanc and Primaxin in neutropenic patient.  Patient has acute renal failure so pharmacy is following vancomycin levels carefully for dosing.  5/20 >> Primaxin >> 5/23 5/20 >> Vanc >>   Tmax: AF WBCs: slowly improving, now 3.2 (PTA decadron resumed, Granix daily) (ANC = 2.1) Renal: AoCKD, SCr 4.42 (improving, baseline 1.7-2.1), CrCl ~11 ml/min (CG), ~13 ml/min (N), UOP great at 2.1/kg/hr Lactic acid: 2.3 (5/20)  07/2013 urine: enterococcus species - sens to Vanc (not sure where this info came from, but do see 06/2013 urine cxs grew kleb and citrobacter) 5/20 MRSA PCR: neg 5/20 blood x2: ngtd 5/20 urine: enterococcus species (R- Amp/LVQ/Tetra, S- Vanc)  Drug level / dose changes  info: 5/21 Vanc random (after 1 dose) = 13.7 5/22 Vanc random = 23.6 after 1250 mg dose, Repeat 6 hours later = 21.6. Ke 0.0125, t1/2 55h. New dose calculated = 1g q48h.  Goal of Therapy:  Doses adjusted per renal function  Plan:   At the request of nephrology (to minimize exposure to nephrotoxins), orders to change vancomycin to daptomycin 400mg  IV q48h (4mg /kg adjusted for CrCl <3ml/min)  Today is Day #5 of antibiotic therapy for enterococcal UTI (planned duration 10 days)  Check CK with am labs although with expected short duration of therapy do no suspect problem  Doreene Eland, PharmD, BCPS.   Pager: [phone]  09/03/2013,1:55 PM

## 2013-09-03 NOTE — Progress Notes (Signed)
[place] for Warfarin Indication: atrial fibrillation  Allergies  Allergen Reactions  . Codeine Nausea Only  . Penicillins     Unknown    Patient Measurements: Height: 4\' 11"  (149.9 cm) Weight: 211 lb 10.3 oz (96 kg) IBW/kg (Calculated) : 43.2  Vital Signs: Temp: 98 F (36.7 C) (05/24 0552) Temp src: Oral (05/24 0552) BP: 101/45 mmHg (05/24 0552) Pulse Rate: 77 (05/24 0552)  Labs:  Recent Labs  09/01/13 0419 09/02/13 0500 09/03/13 0347  HGB 8.3* 7.8* 8.5*  HCT 25.0* 23.7* 25.4*  PLT 166 139* 151  LABPROT 22.2* 22.4* 24.0*  INR 2.02* 2.04* 2.23*  CREATININE 6.50* 5.39* 4.42*    Estimated Creatinine Clearance: 11.7 ml/min (by C-G formula based on Cr of 4.42).   Assessment: 4 yoF admitted 5/20 with sepsis on chronic warfarin prior to admission for history of atrial fibrillation.  PMH also includes: UTI, recurrent diffuse B cell lymphoma, CKD, COPD, DM, neutropenia, anemia.  Pharmacy is consulted to continue warfarin dosing inpatient.  Supratherapeutic INR of 4.4 on admission.  PTA Warfarin 3 mg daily except 6 mg on Saturdays (per 08/18/13 anticoag clinic visit, recent dose reduction), last dose on 5/19  INR 2.23 today, dropped significantly on 5/22 from 5.55 on 5/21 after holding doses x 2 days and 1 mg IV Vitamin K administration (5/21).  SCr 4.42, improving, Renal following  CBC: Hgb and plts improved from yesterday  No bleeding reported.   Goal of Therapy:  INR 2-3 Monitor platelets by anticoagulation protocol: Yes   Plan:  1.  Warfarin 3 mg once today. 2.  Follow up INR in AM.   Thank you for the consult.  Johny Drilling, PharmD, BCPS Pager: [phone] Pharmacy: [phone] 09/03/2013 9:26 AM

## 2013-09-03 NOTE — Progress Notes (Signed)
[facility] Progress Note   Subjective: Creat down 4.42 today  Filed Vitals:   09/02/13 1700 09/02/13 2108 09/03/13 0552 09/03/13 1350  BP: 136/59 112/45 101/45 121/53  Pulse: 83 84 77 82  Temp: 98.3 F (36.8 C) 97.4 F (36.3 C) 98 F (36.7 C) 97.7 F (36.5 C)  TempSrc: Oral Oral Oral Oral  Resp: 17 24 20 20   Height:      Weight:   96 kg (211 lb 10.3 oz)   SpO2: 98% 100% 98% 97%   Exam: Elderly obese, no distress No jvd  Chest clear bilat  RRR no MRG  Abd very obese, soft, dec'd BS, nontender, no hsm  Ext mild pretib edema, no ulcer or gangrene  Neuro is nf, mild gen weakness   UA 100 protein, 21-50wbc, 11-20 rbc, many bact, mod LE, nitrite neg UCx +enterococcus resistant to ampicillin CXR mild CM, clear lungs  Renal US - R ureteral stent in place, no hydro, 9.7/9.9 cm kidneys   Date    Creat   eGFR   CKD  2012    0.9- 1.1  2013    0.9- 1.0  54- 58   Stage III  Jan-Jun 2014   1.0- 1.2  July- Dec 2014  1.0- 1.19 Apr 2013   1.1- 1.3  40- 48   Stage III  Mar 2015   1.57- 1.92  25- 32   Stage IV  Jul 14 2013   1.76  Jul 19 2013   2.10  Jul 24 2013   1.8  Aug 09 2013   2.2   20   Stage IV  Aug 30 2013   7.59   5     Assessment:  1 Acute on CRF- improving (vol depletion, diarrhea, CKD) 2 CKD stage IV- baseline creat 1.7-2.1. She has been losing renal function over the past year or so. She rec'd cisplatinum during the second half of 2014 as part of her second round of chemotherapy 3 Vol depletion- resolved 4 Hypotension- resolved 5 Lymphoma f/b oncology  6 Hx afib- holding po diltiazem due to low BP's  7 Hx R ureteral stent for hydronephrosis- stent is in place by [place] and no hydro 8 Enterococcus UTI- changed to cubicin   Plan- cont IVF, check labs in am      Kelly Splinter MD  pager [phone]    cell [phone]  08/31/2013, 5:20 PM     Recent Labs Lab 09/01/13 0419 09/02/13 0500 09/03/13 0347  NA 134* 138 138  K 3.8 3.7 3.4*  CL 102 106  105  CO2 15* 15* 16*  GLUCOSE 166* 138* 138*  BUN 78* 72* 69*  CREATININE 6.50* 5.39* 4.42*  CALCIUM 8.5 8.4 8.9    Recent Labs Lab 08/30/13 1111 08/30/13 1210 08/31/13 0345  AST 24 24 22   ALT 19 17 16   ALKPHOS 71 68 57  BILITOT 0.36 0.2* <0.2*  PROT 6.6 6.6 5.5*  ALBUMIN 3.4* 3.5 2.9*    Recent Labs Lab 08/30/13 1210 08/31/13 0345 09/01/13 0419 09/02/13 0500 09/03/13 0347  WBC 1.8* 0.7* 0.8* 1.2* 3.2*  NEUTROABS 0.1* 0.0*  --   --  2.1  HGB 10.3* 8.9* 8.3* 7.8* 8.5*  HCT 31.0* 26.9* 25.0* 23.7* 25.4*  MCV 83.3 84.1 83.3 84.6 83.3  PLT 188 156 166 139* 151   . DAPTOmycin (CUBICIN)  IV  400 mg Intravenous Q48H  . dexamethasone  2 mg Oral BID WC  . insulin aspart  0-9 Units Subcutaneous TID WC  . levothyroxine  50 mcg Oral QAC breakfast  . pravastatin  40 mg Oral q1800  . Tbo-Filgrastim  300 mcg Subcutaneous q1800  . tiotropium  18 mcg Inhalation Daily  . Warfarin - Pharmacist Dosing Inpatient   Does not apply q1800   . sodium chloride 40 mL/hr at 09/02/13 2031   albuterol, ondansetron, polyethylene glycol, sodium chloride

## 2013-09-03 NOTE — Evaluation (Signed)
Physical Therapy Evaluation Patient Details Name: [name] MRN: [id number] DOB: 01-15-1941 Today's Date: 09/03/2013   History of Present Illness  73 yo female admitted with acute on chronic renal failure, AMS, sepsis. Hx of SSS-pacemeaker, Afib, DM, COPD, gout, lymphoma.   Clinical Impression  On eval, pt required Mod assist +2 for mobility-able to perform stand pivot from bed to recliner with use of walker. Pt is weak. May need ST rehab for continued rehab.     Follow Up Recommendations SNF (if pt agreeable)    Equipment Recommendations  None recommended by PT    Recommendations for Other Services OT consult     Precautions / Restrictions Precautions Precautions: Fall Restrictions Weight Bearing Restrictions: No      Mobility  Bed Mobility Overal bed mobility: Needs Assistance Bed Mobility: Supine to Sit     Supine to sit: [facility] elevated;Max assist     General bed mobility comments: Mod assist for trunk to upright. Max assist + use of bedpad for scoot to EOB  Transfers Overall transfer level: Needs assistance Equipment used: Rolling walker (2 wheeled) Transfers: Sit to/from [facility] Sit to Stand: From elevated surface;Mod assist;+2 physical assistance;+2 safety/equipment Stand pivot transfers: Mod assist;+2 physical assistance;+2 safety/equipment       General transfer comment: Multiple attempts to get pt to standing. Assist to rise, stabilize, control descent, maneuver with walker.   Ambulation/Gait             General Gait Details: Too weak at time of eval  Stairs            Wheelchair Mobility    Modified Rankin (Stroke Patients Only)       Balance                                             Pertinent Vitals/Pain No c/o pain    Home Living Family/patient expects to be discharged to:: Unsure Living Arrangements: Children   Type of Home: Mobile home Home Access: Stairs to enter Entrance  Stairs-Rails: None Entrance Stairs-Number of Steps: 1 Home Layout: One level Home Equipment: Other (comment);Shower seat - built in;Cane - single point;Electric scooter;Bedside commode;Walker - 2 wheels Additional Comments: pt reports she uses walker in the bathroom and electic scooter in home, pt reports she does not get out in the community much.    Prior Function Level of Independence: Independent with assistive device(s)         Comments: pt reports she mostly uses scooter at home     Hand Dominance        Extremity/Trunk Assessment   Upper Extremity Assessment: Defer to OT evaluation           Lower Extremity Assessment: Generalized weakness      Cervical / Trunk Assessment: Kyphotic  Communication   Communication: No difficulties  Cognition Arousal/Alertness: Awake/alert Behavior During Therapy: WFL for tasks assessed/performed Overall Cognitive Status: Within Functional Limits for tasks assessed                      General Comments      Exercises        Assessment/Plan    PT Assessment Patient needs continued PT services  PT Diagnosis Difficulty walking;Generalized weakness   PT Problem List Decreased strength;Decreased activity tolerance;Decreased balance;Decreased mobility;Obesity;Decreased knowledge of use of DME  PT  Treatment Interventions DME instruction;Gait training;Functional mobility training;Therapeutic activities;Therapeutic exercise;Patient/family education   PT Goals (Current goals can be found in the Care Plan section) Acute Rehab PT Goals Patient Stated Goal: none stated PT Goal Formulation: With patient Time For Goal Achievement: 09/17/13 Potential to Achieve Goals: Good    Frequency Min 3X/week   Barriers to discharge        Co-evaluation               End of Session   Activity Tolerance: Patient limited by fatigue Patient left: in chair;with call bell/phone within reach;with chair alarm set            Time: 8413-2440 PT Time Calculation (min): 20 min   Charges:   PT Evaluation $Initial PT Evaluation Tier I: 1 Procedure PT Treatments $Therapeutic Activity: 8-22 mins   PT G Codes:          Weston Anna, MPT Pager: [phone]

## 2013-09-03 NOTE — Progress Notes (Signed)
PROGRESS NOTE   [name] SWN:[id number] DOB: 1940/07/20 DOA: 08/30/2013 PCP: [name], MD  Assessment/Plan:  Severe sepsis with evidence of renal failure and lactic acidosis  - blood pressure improved, stable on the floor - change to Cubicin to avoid further renal damage, appreciate Dr. Jonnie Finner recommendations.  - blood cultures negative, pending - enterococcus in the urine, sensitive to Vancomycin. UTI - continue Vanc, treat as complicated, will need ~93 days Recurrent diffuse B cell lymphoma - followed as an outpatient by Dr. [name]  Acute renal failure on CKD stage III - multifactorial, ?chemo induced, mild dehydration, Urinary infection  - no hydronephrosis on [place]  - slowly improving  A fib with RVR - not in A fib currently, paced. Continue Coumadin and Cardizem  COPD - no wheezing or evidence of decompensation. Continue home medications  Hyperkalemia - due to renal failure. Kayexalate.  DM - stop metformin, SSI. A1C 5.8 Neutropenia - chemotherapy induced. Monitor. Neupogen per oncology.  Anemia - multifactorial, of chronic disease, on Revlemid. No evidence of bleeding, monitor.  Supratherapeutc INR - Coumadin per pharmacy, now within expected levels. Acute encephalopathy - in the setting of #1, 2, 4, improving SSS s/p PCM    Diet: Renal Fluids: NS DVT Prophylaxis: Coumadin  Code Status: Full Family Communication: d/w patient and son Aaron Edelman over the phone [phone]) Disposition Plan: inpatient, SNF likely Tuesday   Consultants:  Nephrology   Oncology   Procedures:  none   Antibiotics Vancomycin 5/20 >> 5/24 Primaxin 5/20 >> 5/23 Cubicin 5/24 >>  HPI/Subjective: - no complaints, denies shortness of breath, feeling much better  Objective: Filed Vitals:   09/02/13 1700 09/02/13 2108 09/03/13 0552 09/03/13 1350  BP: 136/59 112/45 101/45 121/53  Pulse: 83 84 77 82  Temp: 98.3 F (36.8 C) 97.4 F (36.3 C) 98 F (36.7 C) 97.7 F (36.5 C)   TempSrc: Oral Oral Oral Oral  Resp: 17 24 20 20   Height:      Weight:   96 kg (211 lb 10.3 oz)   SpO2: 98% 100% 98% 97%    Intake/Output Summary (Last 24 hours) at 09/03/13 1352 Last data filed at 09/03/13 1350  Gross per 24 hour  Intake    860 ml  Output   3350 ml  Net  -2490 ml   Filed Weights   09/01/13 0500 09/02/13 0400 09/03/13 0552  Weight: 94.7 kg (208 lb 12.4 oz) 97.5 kg (214 lb 15.2 oz) 96 kg (211 lb 10.3 oz)   Exam:  General:  NAD  Cardiovascular: regular rate and rhythm, without MRG  Respiratory: good air movement, clear to auscultation throughout, no wheezing, ronchi or rales  Abdomen: soft, not tender to palpation, positive bowel sounds  MSK: no peripheral edema  Neuro: non focal  Data Reviewed: Basic Metabolic Panel:  Recent Labs Lab 08/30/13 2000 08/31/13 0345 09/01/13 0419 09/02/13 0500 09/03/13 0347  NA 134* 133* 134* 138 138  K 4.6 4.7 3.8 3.7 3.4*  CL 98 99 102 106 105  CO2 16* 16* 15* 15* 16*  GLUCOSE 111* 113* 166* 138* 138*  BUN 84* 80* 78* 72* 69*  CREATININE 7.37* 7.19* 6.50* 5.39* 4.42*  CALCIUM 10.5 9.7 8.5 8.4 8.9   Liver Function Tests:  Recent Labs Lab 08/30/13 1111 08/30/13 1210 08/31/13 0345  AST 24 24 22   ALT 19 17 16   ALKPHOS 71 68 57  BILITOT 0.36 0.2* <0.2*  PROT 6.6 6.6 5.5*  ALBUMIN 3.4* 3.5 2.9*  Recent Labs Lab 08/30/13 1210  LIPASE 72*   CBC:  Recent Labs Lab 08/30/13 1110 08/30/13 1210 08/31/13 0345 09/01/13 0419 09/02/13 0500 09/03/13 0347  WBC 2.0* 1.8* 0.7* 0.8* 1.2* 3.2*  NEUTROABS 0.2 Repeated and Verified* 0.1* 0.0*  --   --  2.1  HGB 10.8* 10.3* 8.9* 8.3* 7.8* 8.5*  HCT 34.1* 31.0* 26.9* 25.0* 23.7* 25.4*  MCV 86.7 83.3 84.1 83.3 84.6 83.3  PLT 225 188 156 166 139* 151   Cardiac Enzymes: No results found for this basename: CKTOTAL, CKMB, CKMBINDEX, TROPONINI,  in the last 168 hours BNP (last 3 results) No results found for this basename: PROBNP,  in the last 8760  hours CBG:  Recent Labs Lab 09/02/13 1113 09/02/13 1706 09/02/13 2105 09/03/13 0718 09/03/13 1204  GLUCAP 180* 122* 151* 134* 126*    Recent Results (from the past 240 hour(s))  CULTURE, BLOOD (ROUTINE X 2)     Status: None   Collection Time    08/30/13 12:35 PM      Result Value Ref Range Status   Specimen Description BLOOD RIGHT PORTA CATH   Final   Special Requests BOTTLES DRAWN AEROBIC AND ANAEROBIC 5 CC EA   Final   Culture  Setup Time     Final   Value: 08/30/2013 17:07     Performed at [facility]   Culture     Final   Value:        BLOOD CULTURE RECEIVED NO GROWTH TO DATE CULTURE WILL BE HELD FOR 5 DAYS BEFORE ISSUING A FINAL NEGATIVE REPORT     Performed at [facility]   Report Status PENDING   Incomplete  CULTURE, BLOOD (ROUTINE X 2)     Status: None   Collection Time    08/30/13  1:24 PM      Result Value Ref Range Status   Specimen Description BLOOD LEFT ANTECUBITAL   Final   Special Requests BOTTLES DRAWN AEROBIC AND ANAEROBIC 5CC   Final   Culture  Setup Time     Final   Value: 08/30/2013 17:07     Performed at [facility]   Culture     Final   Value:        BLOOD CULTURE RECEIVED NO GROWTH TO DATE CULTURE WILL BE HELD FOR 5 DAYS BEFORE ISSUING A FINAL NEGATIVE REPORT     Performed at [facility]   Report Status PENDING   Incomplete  URINE CULTURE     Status: None   Collection Time    08/30/13  1:25 PM      Result Value Ref Range Status   Specimen Description URINE, CATHETERIZED   Final   Special Requests NONE   Final   Culture  Setup Time     Final   Value: 08/31/2013 01:47     Performed at [facility]     Final   Value: >=100,000 COLONIES/ML     Performed at [facility]   Culture     Final   Value: ENTEROCOCCUS SPECIES     Performed at [facility]   Report Status 09/01/2013 FINAL   Final   Organism ID, Bacteria ENTEROCOCCUS SPECIES   Final  MRSA PCR SCREENING      Status: None   Collection Time    08/30/13  5:08 PM      Result Value Ref Range Status   MRSA by PCR NEGATIVE  NEGATIVE Final   Comment:            The GeneXpert MRSA Assay (FDA     approved for NASAL specimens     only), is one component of a     comprehensive MRSA colonization     surveillance program. It is not     intended to diagnose MRSA     infection nor to guide or     monitor treatment for     MRSA infections.     Studies: No results found.  Scheduled Meds: . dexamethasone  2 mg Oral BID WC  . insulin aspart  0-9 Units Subcutaneous TID WC  . levothyroxine  50 mcg Oral QAC breakfast  . pravastatin  40 mg Oral q1800  . Tbo-Filgrastim  300 mcg Subcutaneous q1800  . tiotropium  18 mcg Inhalation Daily  . vancomycin  1,000 mg Intravenous Q48H  . warfarin  3 mg Oral ONCE-1800  . Warfarin - Pharmacist Dosing Inpatient   Does not apply q1800   Continuous Infusions: . sodium chloride 40 mL/hr at 09/02/13 2031    Active Problems:   Cardiac pacemaker in situ   Diffuse large B cell lymphoma   Hypothyroidism   Diabetes mellitus type 2 in obese   Hyperlipidemia   Antineoplastic chemotherapy induced pancytopenia   Acute renal failure   COPD (chronic obstructive pulmonary disease)   UTI (lower urinary tract infection)   Severe sepsis   Sepsis secondary to UTI   Generalized weakness   Acute on chronic renal failure   CKD (chronic kidney disease) stage 3, GFR 30-59 ml/min   Acute encephalopathy   Neutropenia  Time spent: 25  This note has been created with Surveyor, quantity. Any transcriptional errors are unintentional.   Marzetta Board, MD [facility] Pager [phone]. If 7 PM - 7 AM, please contact night-coverage at [email or web address], password [facility] 09/03/2013, 1:52 PM  LOS: 4 days

## 2013-09-04 ENCOUNTER — Inpatient Hospital Stay (HOSPITAL_COMMUNITY): Payer: Medicare Other

## 2013-09-04 LAB — BASIC METABOLIC PANEL WITH GFR
BUN: 67 mg/dL — ABNORMAL HIGH (ref 6–23)
CO2: 19 meq/L (ref 19–32)
Calcium: 9.6 mg/dL (ref 8.4–10.5)
Chloride: 102 meq/L (ref 96–112)
Creatinine, Ser: 3.61 mg/dL — ABNORMAL HIGH (ref 0.50–1.10)
GFR calc Af Amer: 13 mL/min — ABNORMAL LOW
GFR calc non Af Amer: 12 mL/min — ABNORMAL LOW
Glucose, Bld: 151 mg/dL — ABNORMAL HIGH (ref 70–99)
Potassium: 3.5 meq/L — ABNORMAL LOW (ref 3.7–5.3)
Sodium: 137 meq/L (ref 137–147)

## 2013-09-04 LAB — CBC WITH DIFFERENTIAL/PLATELET
Basophils Absolute: 0 K/uL (ref 0.0–0.1)
Basophils Relative: 0 % (ref 0–1)
Eosinophils Absolute: 0 K/uL (ref 0.0–0.7)
Eosinophils Relative: 0 % (ref 0–5)
HCT: 28.9 % — ABNORMAL LOW (ref 36.0–46.0)
Hemoglobin: 9.8 g/dL — ABNORMAL LOW (ref 12.0–15.0)
Lymphocytes Relative: 6 % — ABNORMAL LOW (ref 12–46)
Lymphs Abs: 0.6 K/uL — ABNORMAL LOW (ref 0.7–4.0)
MCH: 28 pg (ref 26.0–34.0)
MCHC: 33.9 g/dL (ref 30.0–36.0)
MCV: 82.6 fL (ref 78.0–100.0)
Monocytes Absolute: 2.1 K/uL — ABNORMAL HIGH (ref 0.1–1.0)
Monocytes Relative: 20 % — ABNORMAL HIGH (ref 3–12)
Neutro Abs: 7.8 K/uL — ABNORMAL HIGH (ref 1.7–7.7)
Neutrophils Relative %: 74 % (ref 43–77)
Platelets: 182 K/uL (ref 150–400)
RBC: 3.5 MIL/uL — ABNORMAL LOW (ref 3.87–5.11)
RDW: 16.6 % — ABNORMAL HIGH (ref 11.5–15.5)
WBC: 10.5 K/uL (ref 4.0–10.5)

## 2013-09-04 LAB — GLUCOSE, CAPILLARY
Glucose-Capillary: 130 mg/dL — ABNORMAL HIGH (ref 70–99)
Glucose-Capillary: 134 mg/dL — ABNORMAL HIGH (ref 70–99)
Glucose-Capillary: 187 mg/dL — ABNORMAL HIGH (ref 70–99)
Glucose-Capillary: 215 mg/dL — ABNORMAL HIGH (ref 70–99)

## 2013-09-04 LAB — PROTIME-INR
INR: 2.62 — ABNORMAL HIGH (ref 0.00–1.49)
Prothrombin Time: 27.1 s — ABNORMAL HIGH (ref 11.6–15.2)

## 2013-09-04 LAB — CK: Total CK: 27 U/L (ref 7–177)

## 2013-09-04 LAB — TROPONIN I: Troponin I: <0.3 ng/mL

## 2013-09-04 MED ORDER — GLYCERIN (LAXATIVE) 2.1 G RE SUPP
1.0000 | Freq: Once | RECTAL | Status: AC
  Administered 2013-09-04: 1 via RECTAL
  Filled 2013-09-04: qty 1

## 2013-09-04 MED ORDER — SODIUM CHLORIDE 0.9 % IV SOLN
250.0000 mg | Freq: Two times a day (BID) | INTRAVENOUS | Status: DC
  Administered 2013-09-04 – 2013-09-07 (×7): 250 mg via INTRAVENOUS
  Filled 2013-09-04 (×8): qty 250

## 2013-09-04 MED ORDER — METOPROLOL TARTRATE 1 MG/ML IV SOLN
2.5000 mg | Freq: Once | INTRAVENOUS | Status: AC
  Administered 2013-09-04: 2.5 mg via INTRAVENOUS
  Filled 2013-09-04: qty 5

## 2013-09-04 MED ORDER — DILTIAZEM HCL ER COATED BEADS 180 MG PO CP24
180.0000 mg | ORAL_CAPSULE | Freq: Every day | ORAL | Status: DC
  Administered 2013-09-04 – 2013-09-07 (×4): 180 mg via ORAL
  Filled 2013-09-04 (×4): qty 1

## 2013-09-04 MED ORDER — WARFARIN SODIUM 2 MG PO TABS
2.0000 mg | ORAL_TABLET | Freq: Once | ORAL | Status: AC
  Administered 2013-09-04: 2 mg via ORAL
  Filled 2013-09-04: qty 1

## 2013-09-04 MED ORDER — SENNOSIDES-DOCUSATE SODIUM 8.6-50 MG PO TABS
2.0000 | ORAL_TABLET | Freq: Once | ORAL | Status: AC
  Administered 2013-09-04: 2 via ORAL
  Filled 2013-09-04: qty 2

## 2013-09-04 MED ORDER — METOPROLOL TARTRATE 1 MG/ML IV SOLN
5.0000 mg | Freq: Once | INTRAVENOUS | Status: AC
  Administered 2013-09-04: 5 mg via INTRAVENOUS
  Filled 2013-09-04: qty 5

## 2013-09-04 NOTE — Progress Notes (Signed)
Follow up EKG completed as ordered. Confirmed A-fib with RVR--rate 90 to 120's. Pt sleeping at the time. SP, RN

## 2013-09-04 NOTE — Progress Notes (Signed)
Clinical Social Work Department CLINICAL SOCIAL WORK PLACEMENT NOTE 09/04/2013  Patient:  [name], [name]  Account Number:  [id number] Admit date:  08/30/2013  Clinical Social Worker:  Renold Genta  Date/time:  09/04/2013 11:57 AM  Clinical Social Work is seeking post-discharge placement for this patient at the following level of care:   SKILLED NURSING   (*CSW will update this form in Epic as items are completed)   09/04/2013  Patient/family provided with [place] Department of Clinical Social Work's list of facilities offering this level of care within the geographic area requested by the patient (or if unable, by the patient's family).  09/04/2013  Patient/family informed of their freedom to choose among providers that offer the needed level of care, that participate in Medicare, Medicaid or managed care program needed by the patient, have an available bed and are willing to accept the patient.  09/04/2013  Patient/family informed of [facility]' ownership interest in [facility], as well as of the fact that they are under no obligation to receive care at this facility.  PASARR submitted to EDS on 09/04/2013 PASARR number received from EDS on 09/04/2013  FL2 transmitted to all facilities in geographic area requested by pt/family on  09/04/2013 FL2 transmitted to all facilities within larger geographic area on   Patient informed that his/her managed care company has contracts with or will negotiate with  certain facilities, including the following:   [facility]     Patient/family informed of bed offers received:  09/04/2013 Patient chooses bed at [place] Physician recommends and patient chooses bed at    Patient to be transferred to [facility] on   Patient to be transferred to facility by   The following physician request were entered in Epic:   Additional Comments:   [name], [place]  Worker cell #: [phone]

## 2013-09-04 NOTE — Progress Notes (Signed)
Received call from CMT stating increase heart rate. Patient heart rate range 110-150's--irregular rhythm. 12 lead EKG completed. See chart. AT 0210 heart rate increased to 150-160 then returned to 110. Pt sleeping in recliner. Refused to sleep in bed. States she is able to breathe easier when sleeping in chair. NP notified and orders received. Lopressor given as prescribed. VS stable--see computer documentation. Will repeat 12 Lead EKG and notify NP. Will continue to monitor. SP, RN

## 2013-09-04 NOTE — Progress Notes (Signed)
Clinical Social Work Department BRIEF PSYCHOSOCIAL ASSESSMENT 09/04/2013  Patient:  [name], [name]     Account Number:  [id number]     Admit date:  08/30/2013  Clinical Social Worker:  Renold Genta  Date/Time:  09/04/2013 11:48 AM  Referred by:  Physician  Date Referred:  09/04/2013 Referred for  SNF Placement   Other Referral:   Interview type:  Patient Other interview type:   and son, Aaron Edelman via phone    PSYCHOSOCIAL DATA Living Status:  [place] Admitted from facility:   Level of care:   Primary support name:  Tressie Stalker (niece) ph#: [phone] Primary support relationship to patient:  FAMILY Degree of support available:   good    CURRENT CONCERNS Current Concerns  Post-Acute Placement   Other Concerns:    SOCIAL WORK ASSESSMENT / PLAN CSW reviewed patient's chart/PT evaluation recommending SNF placement.   Assessment/plan status:  Information/Referral to [employer] Other assessment/ plan:   Information/referral to community resources:   CSW completed FL2 and faxed information out to [facility].    PATIENT'S/FAMILY'S RESPONSE TO PLAN OF CARE: CSW spoke with patient who was hesitant to SNF at first, but after speaking with her son who was agreeable with plan for SNF, patient is now ok with going to a SNF for rehab. Patient had been to [facility] 07/03/13.       Raynaldo Opitz, [facility] Clinical Social Worker cell #: [phone]

## 2013-09-04 NOTE — Progress Notes (Signed)
PROGRESS NOTE   [name] Claw NLG:[id number] DOB: 1941-01-04 DOA: 08/30/2013 PCP: [name], MD  Assessment/Plan: A fib with RVR - more tachycardic last night overnight, diltiazem restarted this morning. Initially held due to hypotension. Continue Coumadin. COPD - no wheezing or evidence of decompensation. Continue home medications  - coughing up a bit more this morning, CXR with atelectasis but possible pneumonia, will add back Primaxin.   UTI - continue Cubicin, treat as complicated, will need ~08 days Severe sepsis with evidence of renal failure and lactic acidosis  - blood pressure improved, stable on the floor - change to Cubicin to avoid further renal damage, appreciate Dr. Jonnie Finner recommendations.  - blood cultures negative, pending - enterococcus in the urine, sensitive to Vancomycin. Recurrent diffuse B cell lymphoma - followed as an outpatient by Dr. [name]  Acute renal failure on CKD stage III - multifactorial, ?chemo induced, mild dehydration, Urinary infection  - no hydronephrosis on [place]  - slowly improving, Cr. To 3.6 this morning Hyperkalemia - due to renal failure. Kayexalate.  DM - stop metformin, SSI. A1C 5.8 Neutropenia - chemotherapy induced. Monitor. Neupogen per oncology, now 2 days with ANC > 1500, discontinue 5/25. Anemia - multifactorial, of chronic disease, on Revlemid. No evidence of bleeding, monitor.  Supratherapeutc INR - Coumadin per pharmacy, now within expected levels. Acute encephalopathy - in the setting of #1, 2, 4, improving SSS s/p PCM    Diet: Renal Fluids: NS DVT Prophylaxis: Coumadin  Code Status: Full Family Communication: d/w patient today. Aaron Edelman over the phone [phone]) Disposition Plan: inpatient, SNF 2-3 days   Consultants:  Nephrology   Oncology   Procedures:  none   Antibiotics Vancomycin 5/20 >> 5/24 Primaxin 5/20 >> 5/23; 5/25 >> Cubicin 5/24 >>  HPI/Subjective: - no complaints, denies shortness of  breath, endorses coughing.   Objective: Filed Vitals:   09/04/13 0300 09/04/13 0614 09/04/13 0653 09/04/13 0923  BP: 115/47 131/56    Pulse: 109 99    Temp:  97.5 F (36.4 C)    TempSrc:  Oral    Resp:  22    Height:      Weight:   96.208 kg (212 lb 1.6 oz)   SpO2:  98%  96%    Intake/Output Summary (Last 24 hours) at 09/04/13 1040 Last data filed at 09/04/13 6314  Gross per 24 hour  Intake    600 ml  Output   4975 ml  Net  -4375 ml   Filed Weights   09/02/13 0400 09/03/13 0552 09/04/13 0653  Weight: 97.5 kg (214 lb 15.2 oz) 96 kg (211 lb 10.3 oz) 96.208 kg (212 lb 1.6 oz)   Exam:  General:  NAD  Cardiovascular: regular rate and rhythm, without MRG  Respiratory: good air movement, clear to auscultation throughout, no wheezing, ronchi or rales  Abdomen: soft, not tender to palpation, positive bowel sounds  MSK: no peripheral edema  Neuro: non focal  Data Reviewed: Basic Metabolic Panel:  Recent Labs Lab 08/31/13 0345 09/01/13 0419 09/02/13 0500 09/03/13 0347 09/04/13 0435  NA 133* 134* 138 138 137  K 4.7 3.8 3.7 3.4* 3.5*  CL 99 102 106 105 102  CO2 16* 15* 15* 16* 19  GLUCOSE 113* 166* 138* 138* 151*  BUN 80* 78* 72* 69* 67*  CREATININE 7.19* 6.50* 5.39* 4.42* 3.61*  CALCIUM 9.7 8.5 8.4 8.9 9.6   Liver Function Tests:  Recent Labs Lab 08/30/13 1111 08/30/13 1210 08/31/13 0345  AST 24 24  22  ALT 19 17 16   ALKPHOS 71 68 57  BILITOT 0.36 0.2* <0.2*  PROT 6.6 6.6 5.5*  ALBUMIN 3.4* 3.5 2.9*    Recent Labs Lab 08/30/13 1210  LIPASE 72*   CBC:  Recent Labs Lab 08/30/13 1110  08/30/13 1210 08/31/13 0345 09/01/13 0419 09/02/13 0500 09/03/13 0347 09/04/13 0435  WBC 2.0*  < > 1.8* 0.7* 0.8* 1.2* 3.2* 10.5  NEUTROABS 0.2 Repeated and Verified*  --  0.1* 0.0*  --   --  2.1 7.8*  HGB 10.8*  < > 10.3* 8.9* 8.3* 7.8* 8.5* 9.8*  HCT 34.1*  < > 31.0* 26.9* 25.0* 23.7* 25.4* 28.9*  MCV 86.7  < > 83.3 84.1 83.3 84.6 83.3 82.6  PLT 225  < >  188 156 166 139* 151 182  < > = values in this interval not displayed. Cardiac Enzymes:  Recent Labs Lab 09/04/13 0435 09/04/13 0500  CKTOTAL 27  --   TROPONINI  --  <0.30   BNP (last 3 results) No results found for this basename: PROBNP,  in the last 8760 hours CBG:  Recent Labs Lab 09/03/13 0718 09/03/13 1204 09/03/13 1638 09/03/13 2133 09/04/13 0736  GLUCAP 134* 126* 187* 172* 130*    Recent Results (from the past 240 hour(s))  CULTURE, BLOOD (ROUTINE X 2)     Status: None   Collection Time    08/30/13 12:35 PM      Result Value Ref Range Status   Specimen Description BLOOD RIGHT PORTA CATH   Final   Special Requests BOTTLES DRAWN AEROBIC AND ANAEROBIC 5 CC EA   Final   Culture  Setup Time     Final   Value: 08/30/2013 17:07     Performed at [facility]   Culture     Final   Value:        BLOOD CULTURE RECEIVED NO GROWTH TO DATE CULTURE WILL BE HELD FOR 5 DAYS BEFORE ISSUING A FINAL NEGATIVE REPORT     Performed at [facility]   Report Status PENDING   Incomplete  CULTURE, BLOOD (ROUTINE X 2)     Status: None   Collection Time    08/30/13  1:24 PM      Result Value Ref Range Status   Specimen Description BLOOD LEFT ANTECUBITAL   Final   Special Requests BOTTLES DRAWN AEROBIC AND ANAEROBIC 5CC   Final   Culture  Setup Time     Final   Value: 08/30/2013 17:07     Performed at [facility]   Culture     Final   Value:        BLOOD CULTURE RECEIVED NO GROWTH TO DATE CULTURE WILL BE HELD FOR 5 DAYS BEFORE ISSUING A FINAL NEGATIVE REPORT     Performed at [facility]   Report Status PENDING   Incomplete  URINE CULTURE     Status: None   Collection Time    08/30/13  1:25 PM      Result Value Ref Range Status   Specimen Description URINE, CATHETERIZED   Final   Special Requests NONE   Final   Culture  Setup Time     Final   Value: 08/31/2013 01:47     Performed at [facility]     Final   Value:  >=100,000 COLONIES/ML     Performed at [place]     Final  Value: ENTEROCOCCUS SPECIES     Performed at [facility]   Report Status 09/01/2013 FINAL   Final   Organism ID, Bacteria ENTEROCOCCUS SPECIES   Final  MRSA PCR SCREENING     Status: None   Collection Time    08/30/13  5:08 PM      Result Value Ref Range Status   MRSA by PCR NEGATIVE  NEGATIVE Final   Comment:            The GeneXpert MRSA Assay (FDA     approved for NASAL specimens     only), is one component of a     comprehensive MRSA colonization     surveillance program. It is not     intended to diagnose MRSA     infection nor to guide or     monitor treatment for     MRSA infections.     Studies: Dg Chest Port 1 View  09/04/2013   CLINICAL DATA:  Cough.  EXAM: PORTABLE CHEST - 1 VIEW  COMPARISON:  May 20th 2015.  FINDINGS: Stable mild cardiomegaly. Left-sided pacemaker in right subclavian Port-A-Cath are unchanged in position. Left lung is clear. Increased right lower lobe opacity is noted concerning for worsening subsegmental atelectasis or possibly pneumonia. Bony thorax appears intact. No pneumothorax or significant pleural effusion is noted.  IMPRESSION: Increased right lower lobe opacity is noted concerning for subsegmental atelectasis or pneumonia.   Electronically Signed   By: Sabino Dick M.D.   On: 09/04/2013 09:43    Scheduled Meds: . DAPTOmycin (CUBICIN)  IV  400 mg Intravenous Q48H  . dexamethasone  2 mg Oral BID WC  . diltiazem  180 mg Oral Daily  . insulin aspart  0-9 Units Subcutaneous TID WC  . levothyroxine  50 mcg Oral QAC breakfast  . pravastatin  40 mg Oral q1800  . Tbo-Filgrastim  300 mcg Subcutaneous q1800  . tiotropium  18 mcg Inhalation Daily  . warfarin  2 mg Oral ONCE-1800  . Warfarin - Pharmacist Dosing Inpatient   Does not apply q1800   Continuous Infusions: . sodium chloride 40 mL/hr at 09/04/13 5732    Active Problems:   Cardiac pacemaker in  situ   Diffuse large B cell lymphoma   Hypothyroidism   Diabetes mellitus type 2 in obese   Hyperlipidemia   Antineoplastic chemotherapy induced pancytopenia   Acute renal failure   COPD (chronic obstructive pulmonary disease)   UTI (lower urinary tract infection)   Severe sepsis   Sepsis secondary to UTI   Generalized weakness   Acute on chronic renal failure   CKD (chronic kidney disease) stage 3, GFR 30-59 ml/min   Acute encephalopathy   Neutropenia  Time spent: 25  This note has been created with Surveyor, quantity. Any transcriptional errors are unintentional.   Marzetta Board, MD [facility] Pager [phone]. If 7 PM - 7 AM, please contact night-coverage at [email or web address], password [email or web address] 09/04/2013, 10:40 AM  LOS: 5 days

## 2013-09-04 NOTE — Progress Notes (Signed)
Physical Therapy Treatment Patient Details Name: [name] MRN: [id number] DOB: 1940-10-10 Today's Date: 09/04/2013    History of Present Illness 73 yo female admitted with acute on chronic renal failure, AMS, sepsis. Hx of SSS-pacemeaker, Afib, DM, COPD, gout, lymphoma.     PT Comments    Pt OOB in recliner.  Assisted to [facility] for attempted BM.  Assisted with amb very limited distance as pt feared her legs would give out. Pt will need ST Rehab at SNF to regain prior level of mobility.   Follow Up Recommendations  SNF ([facility])     Equipment Recommendations       Recommendations for Other Services       Precautions / Restrictions Precautions Precautions: Fall    Mobility  Bed Mobility               General bed mobility comments: Pt OOB in recliner   Transfers Overall transfer level: Needs assistance Equipment used: Rolling walker (2 wheeled)   Sit to Stand: From elevated surface;Mod assist;+2 physical assistance;+2 safety/equipment Stand pivot transfers: Mod assist;+2 physical assistance;+2 safety/equipment       General transfer comment: increased time and VC's on proper hand placement to increase self assist  Ambulation/Gait Ambulation/Gait assistance: +2 physical assistance;+2 safety/equipment;Max assist Ambulation Distance (Feet): 5 Feet Assistive device: Rolling walker (2 wheeled) Gait Pattern/deviations: Step-to pattern;Step-through pattern;Decreased step length - right;Decreased step length - left;Trunk flexed;Shuffle Gait velocity: decreased    General Gait Details: Max encouragement to increase amb distance.  Pt fearful her "legs will give out".     Stairs            Wheelchair Mobility    Modified Rankin (Stroke Patients Only)       Balance                                    Cognition                            Exercises      General Comments        Pertinent Vitals/Pain     Home  Living                      Prior Function            PT Goals (current goals can now be found in the care plan section) Progress towards PT goals: Progressing toward goals    Frequency  Min 3X/week    PT Plan      Co-evaluation             End of Session Equipment Utilized During Treatment: Gait belt Activity Tolerance: Patient limited by fatigue Patient left: in chair;with call bell/phone within reach;with chair alarm set     Time: 3762-8315 PT Time Calculation (min): 25 min  Charges:  $Gait Training: 8-22 mins $Therapeutic Activity: 8-22 mins                    G Codes:      [name]  PTA [facility]  Acute  Rehab Pager      [phone]

## 2013-09-04 NOTE — Progress Notes (Signed)
[facility] for Warfarin Indication: atrial fibrillation  Allergies  Allergen Reactions  . Codeine Nausea Only  . Penicillins     Unknown    Patient Measurements: Height: 4\' 11"  (149.9 cm) Weight: 212 lb 1.6 oz (96.208 kg) IBW/kg (Calculated) : 43.2  Vital Signs: Temp: 97.5 F (36.4 C) (05/25 0614) Temp src: Oral (05/25 0614) BP: 131/56 mmHg (05/25 0614) Pulse Rate: 99 (05/25 0614)  Labs:  Recent Labs  09/02/13 0500 09/03/13 0347 09/04/13 0435 09/04/13 0500  HGB 7.8* 8.5* 9.8*  --   HCT 23.7* 25.4* 28.9*  --   PLT 139* 151 182  --   LABPROT 22.4* 24.0* 27.1*  --   INR 2.04* 2.23* 2.62*  --   CREATININE 5.39* 4.42* 3.61*  --   CKTOTAL  --   --  27  --   TROPONINI  --   --   --  <0.30    Estimated Creatinine Clearance: 14.3 ml/min (by C-G formula based on Cr of 3.61).   Assessment: 40 yoF admitted 5/20 with sepsis on chronic warfarin prior to admission for history of atrial fibrillation.  PMH also includes: UTI, recurrent diffuse B cell lymphoma, CKD, COPD, DM, neutropenia, anemia.  Pharmacy is consulted to continue warfarin dosing inpatient.  Supratherapeutic INR of 4.4 on admission.  PTA Warfarin 3 mg daily except 6 mg on Saturdays (per 08/18/13 anticoag clinic visit, recent dose reduction), last dose on 5/19  INR 2.62 today, rising since warfarin resumed 5/22 (3mg , 3mg , 3mg )  SCr improving daily, Renal following  CBC: Hgb and plts improving  No bleeding reported.   Goal of Therapy:  INR 2-3 Monitor platelets by anticoagulation protocol: Yes   Plan:   Decrease Warfarin 2mg  today to avoid further increase.  Daily PT/INR   Peggyann Juba, PharmD, BCPS Pager: [phone] Pharmacy: [phone] 09/04/2013 7:48 AM

## 2013-09-04 NOTE — Progress Notes (Signed)
ANTIBIOTIC CONSULT NOTE - INITIAL  Pharmacy Consult for Primaxin Indication: HCAP  Allergies  Allergen Reactions  . Codeine Nausea Only  . Penicillins     Unknown    Patient Measurements: Height: 4\' 11"  (149.9 cm) Weight: 212 lb 1.6 oz (96.208 kg) IBW/kg (Calculated) : 43.2  Vital Signs: Temp: 97.5 F (36.4 C) (05/25 2423) Temp src: Oral (05/25 0614) BP: 131/56 mmHg (05/25 0614) Pulse Rate: 99 (05/25 0614) Intake/Output from previous day: 05/24 0701 - 05/25 0700 In: 720 [P.O.:240; I.V.:480] Out: 4975 [Urine:4975] Intake/Output from this shift:    Labs:  Recent Labs  09/02/13 0500 09/03/13 0347 09/04/13 0435  WBC 1.2* 3.2* 10.5  HGB 7.8* 8.5* 9.8*  PLT 139* 151 182  CREATININE 5.39* 4.42* 3.61*   Estimated Creatinine Clearance: 14.3 ml/min (by C-G formula based on Cr of 3.61). No results found for this basename: VANCOTROUGH, VANCOPEAK, VANCORANDOM, GENTTROUGH, GENTPEAK, GENTRANDOM, TOBRATROUGH, TOBRAPEAK, TOBRARND, AMIKACINPEAK, AMIKACINTROU, AMIKACIN,  in the last 72 hours   Microbiology: Recent Results (from the past 720 hour(s))  CULTURE, BLOOD (ROUTINE X 2)     Status: None   Collection Time    08/30/13 12:35 PM      Result Value Ref Range Status   Specimen Description BLOOD RIGHT PORTA CATH   Final   Special Requests BOTTLES DRAWN AEROBIC AND ANAEROBIC 5 CC EA   Final   Culture  Setup Time     Final   Value: 08/30/2013 17:07     Performed at [facility]   Culture     Final   Value:        BLOOD CULTURE RECEIVED NO GROWTH TO DATE CULTURE WILL BE HELD FOR 5 DAYS BEFORE ISSUING A FINAL NEGATIVE REPORT     Performed at [facility]   Report Status PENDING   Incomplete  CULTURE, BLOOD (ROUTINE X 2)     Status: None   Collection Time    08/30/13  1:24 PM      Result Value Ref Range Status   Specimen Description BLOOD LEFT ANTECUBITAL   Final   Special Requests BOTTLES DRAWN AEROBIC AND ANAEROBIC 5CC   Final   Culture  Setup Time      Final   Value: 08/30/2013 17:07     Performed at [facility]   Culture     Final   Value:        BLOOD CULTURE RECEIVED NO GROWTH TO DATE CULTURE WILL BE HELD FOR 5 DAYS BEFORE ISSUING A FINAL NEGATIVE REPORT     Performed at [facility]   Report Status PENDING   Incomplete  URINE CULTURE     Status: None   Collection Time    08/30/13  1:25 PM      Result Value Ref Range Status   Specimen Description URINE, CATHETERIZED   Final   Special Requests NONE   Final   Culture  Setup Time     Final   Value: 08/31/2013 01:47     Performed at [place]     Final   Value: >=100,000 COLONIES/ML     Performed at [facility]   Culture     Final   Value: ENTEROCOCCUS SPECIES     Performed at [facility]   Report Status 09/01/2013 FINAL   Final   Organism ID, Bacteria ENTEROCOCCUS SPECIES   Final  MRSA PCR SCREENING     Status: None  Collection Time    08/30/13  5:08 PM      Result Value Ref Range Status   MRSA by PCR NEGATIVE  NEGATIVE Final   Comment:            The GeneXpert MRSA Assay (FDA     approved for NASAL specimens     only), is one component of a     comprehensive MRSA colonization     surveillance program. It is not     intended to diagnose MRSA     infection nor to guide or     monitor treatment for     MRSA infections.    Medical History: Past Medical History  Diagnosis Date  . Sick sinus syndrome   . Hematoma     At the site of the pacemaker insertion.  . Hypotension   . Dyslipidemia   . History of atrial fibrillation   . Arthritis     Osteoarthritis  . Port-a-cath in place 12/08/10  . Status post chemotherapy completed 03/2011    R - CHOP q 3 weeks x 6  . S/P radiation therapy 05/28/2011 - 06/26/2011    Right Abdomen and Right Pelvis/3060 cGy in 17 Fractions  . Hypertension   . ASHD (arteriosclerotic heart disease)   . Gout   . COPD (chronic obstructive pulmonary disease)   . Asthma   .  Hyperlipidemia   . Diabetes mellitus   . Hypothyroidism   . Diffuse large B cell lymphoma     Dr. Chisom,oncology  . Cancer 10/2010    large B cell lymphoma  . 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 Pacemaker     left chest  . Dysrhythmia     Hx. A. Fib, sick sinus syndrome- Pacemaker inserted.  . Cataracts, bilateral     Medications:  Scheduled:  . DAPTOmycin (CUBICIN)  IV  400 mg Intravenous Q48H  . dexamethasone  2 mg Oral BID WC  . diltiazem  180 mg Oral Daily  . insulin aspart  0-9 Units Subcutaneous TID WC  . levothyroxine  50 mcg Oral QAC breakfast  . pravastatin  40 mg Oral q1800  . tiotropium  18 mcg Inhalation Daily  . warfarin  2 mg Oral ONCE-1800  . Warfarin - Pharmacist Dosing Inpatient   Does not apply q1800   Infusions:  . sodium chloride 40 mL/hr at 09/04/13 0753   PRN: albuterol, ondansetron, polyethylene glycol, sodium chloride  Anti-infectives: 5/20 >> Vancomycin >> 5/24 5/20 >> Primaxin >> 5/23 5/24 >> Daptomycin >> 5/25 >> Primaxin  Microbiology: 07/2013 urine: enterococcus species - sens to Vanc (not sure where this info came from, but do see 06/2013 urine cxs grew kleb and citrobacter) 5/20 MRSA PCR: neg 5/20 blood x2: ngtd 5/20 urine: enterococcus species (R- Amp/LVQ/Tetra, S- Vanc)  Assessment: 73 yo female with lymphoma, being treated with broad spectrum antibiotics for sepsis d/t UTI - currently on daptomycin for enterococcal coverage. Pharmacy consulted today to resume Primaxin for possible HCAP.  Tmax: AF WBCs: improved to WNL 10.5 today (PTA decadron resumed, Granix daily) (ANC = 7.8) Renal: AoCKD, SCr 3.61 (improving, baseline 1.7-2.1), CrCl ~14 ml/min (CG), UOP great at 2.2 ml/kg/hr Lactic acid: 2.3 (5/20)  Goal of Therapy:  Dose per weight and renal function  Plan:   Resume Primaxin 250mg  IV q12h  Follow renal function and adjust as needed  Peggyann Juba, PharmD, BCPS Pager:  [phone] 09/04/2013,11:02 AM

## 2013-09-04 NOTE — Progress Notes (Signed)
Patient ID: [name], female   DOB: Aug 21, 1940, 74 y.o.   MRN: [id number] S:events of last 24 hours noted.  dilt restarted due to Afib with RVR and hypotension O:BP 131/56  Pulse 99  Temp(Src) 97.5 F (36.4 C) (Oral)  Resp 22  Ht 4\' 11"  (1.499 m)  Wt 96.208 kg (212 lb 1.6 oz)  BMI 42.82 kg/m2  SpO2 96%  Intake/Output Summary (Last 24 hours) at 09/04/13 1253 Last data filed at 09/04/13 0830  Gross per 24 hour  Intake    720 ml  Output   4975 ml  Net  -4255 ml   Intake/Output: I/O last 3 completed shifts: In: 940 [P.O.:460; I.V.:480] Out: 7525 [Urine:7525]  Intake/Output this shift:  Total I/O In: 120 [P.O.:120] Out: -  Weight change: 0.208 kg (7.3 oz) FMB:WGYKZ, [name] in NAD CVS:irr irr Resp:cta LDJ:TTSVXB Ext:+edema   Recent Labs Lab 08/30/13 1111 08/30/13 1210 08/30/13 2000 08/31/13 0345 09/01/13 0419 09/02/13 0500 09/03/13 0347 09/04/13 0435  NA 132* 130* 134* 133* 134* 138 138 137  K 5.5* 5.7* 4.6 4.7 3.8 3.7 3.4* 3.5*  CL  --  92* 98 99 102 106 105 102  CO2 14* 14* 16* 16* 15* 15* 16* 19  GLUCOSE 103 91 111* 113* 166* 138* 138* 151*  BUN 86.3* 85* 84* 80* 78* 72* 69* 67*  CREATININE 7.6 Repeated and Verified* 7.59* 7.37* 7.19* 6.50* 5.39* 4.42* 3.61*  ALBUMIN 3.4* 3.5  --  2.9*  --   --   --   --   CALCIUM 11.7* 11.5* 10.5 9.7 8.5 8.4 8.9 9.6  AST 24 24  --  22  --   --   --   --   ALT 19 17  --  16  --   --   --   --    Liver Function Tests:  Recent Labs Lab 08/30/13 1111 08/30/13 1210 08/31/13 0345  AST 24 24 22   ALT 19 17 16   ALKPHOS 71 68 57  BILITOT 0.36 0.2* <0.2*  PROT 6.6 6.6 5.5*  ALBUMIN 3.4* 3.5 2.9*    Recent Labs Lab 08/30/13 1210  LIPASE 72*   No results found for this basename: AMMONIA,  in the last 168 hours CBC:  Recent Labs Lab 08/31/13 0345 09/01/13 0419 09/02/13 0500 09/03/13 0347 09/04/13 0435  WBC 0.7* 0.8* 1.2* 3.2* 10.5  NEUTROABS 0.0*  --   --  2.1 7.8*  HGB 8.9* 8.3* 7.8* 8.5* 9.8*  HCT  26.9* 25.0* 23.7* 25.4* 28.9*  MCV 84.1 83.3 84.6 83.3 82.6  PLT 156 166 139* 151 182   Cardiac Enzymes:  Recent Labs Lab 09/04/13 0435 09/04/13 0500  CKTOTAL 27  --   TROPONINI  --  <0.30   CBG:  Recent Labs Lab 09/03/13 1204 09/03/13 1638 09/03/13 2133 09/04/13 0736 09/04/13 1156  GLUCAP 126* 187* 172* 130* 134*    Iron Studies: No results found for this basename: IRON, TIBC, TRANSFERRIN, FERRITIN,  in the last 72 hours Studies/Results: Dg Chest Port 1 View  09/04/2013   CLINICAL DATA:  Cough.  EXAM: PORTABLE CHEST - 1 VIEW  COMPARISON:  May 20th 2015.  FINDINGS: Stable mild cardiomegaly. Left-sided pacemaker in right subclavian Port-A-Cath are unchanged in position. Left lung is clear. Increased right lower lobe opacity is noted concerning for worsening subsegmental atelectasis or possibly pneumonia. Bony thorax appears intact. No pneumothorax or significant pleural effusion is noted.  IMPRESSION: Increased right lower lobe opacity is noted  concerning for subsegmental atelectasis or pneumonia.   Electronically Signed   By: Sabino Dick M.D.   On: 09/04/2013 09:43   . DAPTOmycin (CUBICIN)  IV  400 mg Intravenous Q48H  . dexamethasone  2 mg Oral BID WC  . diltiazem  180 mg Oral Daily  . imipenem-cilastatin  250 mg Intravenous Q12H  . insulin aspart  0-9 Units Subcutaneous TID WC  . levothyroxine  50 mcg Oral QAC breakfast  . pravastatin  40 mg Oral q1800  . tiotropium  18 mcg Inhalation Daily  . warfarin  2 mg Oral ONCE-1800  . Warfarin - Pharmacist Dosing Inpatient   Does not apply q1800    BMET    Component Value Date/Time   NA 137 09/04/2013 0435   NA 132* 08/30/2013 1111   NA 141 10/12/2011 0957   K 3.5* 09/04/2013 0435   K 5.5* 08/30/2013 1111   K 4.4 10/12/2011 0957   CL 102 09/04/2013 0435   CL 102 09/21/2012 0917   CL 95* 10/12/2011 0957   CO2 19 09/04/2013 0435   CO2 14* 08/30/2013 1111   CO2 29 10/12/2011 0957   GLUCOSE 151* 09/04/2013 0435   GLUCOSE 103 08/30/2013  1111   GLUCOSE 161* 09/21/2012 0917   GLUCOSE 131* 10/12/2011 0957   BUN 67* 09/04/2013 0435   BUN 86.3* 08/30/2013 1111   BUN 15 10/12/2011 0957   CREATININE 3.61* 09/04/2013 0435   CREATININE 7.6 Repeated and Verified* 08/30/2013 1111   CREATININE 1.10 04/19/2013 1420   CALCIUM 9.6 09/04/2013 0435   CALCIUM 11.7* 08/30/2013 1111   CALCIUM 9.1 10/12/2011 0957   GFRNONAA 12* 09/04/2013 0435   GFRNONAA 50* 04/19/2013 1420   GFRAA 13* 09/04/2013 0435   GFRAA 58* 04/19/2013 1420   CBC    Component Value Date/Time   WBC 10.5 09/04/2013 0435   WBC 2.0* 08/30/2013 1110   RBC 3.50* 09/04/2013 0435   RBC 3.94 08/30/2013 1110   HGB 9.8* 09/04/2013 0435   HGB 10.8* 08/30/2013 1110   HCT 28.9* 09/04/2013 0435   HCT 34.1* 08/30/2013 1110   PLT 182 09/04/2013 0435   PLT 225 08/30/2013 1110   MCV 82.6 09/04/2013 0435   MCV 86.7 08/30/2013 1110   MCH 28.0 09/04/2013 0435   MCH 27.5 08/30/2013 1110   MCHC 33.9 09/04/2013 0435   MCHC 31.8 08/30/2013 1110   RDW 16.6* 09/04/2013 0435   RDW 18.0* 08/30/2013 1110   LYMPHSABS 0.6* 09/04/2013 0435   LYMPHSABS 1.3 08/30/2013 1110   MONOABS 2.1* 09/04/2013 0435   MONOABS 0.4 08/30/2013 1110   EOSABS 0.0 09/04/2013 0435   EOSABS 0.1 08/30/2013 1110   BASOSABS 0.0 09/04/2013 0435   BASOSABS 0.0 08/30/2013 1110     Assessment/Plan:  1. AKI/CKD- multi-factorial with R-sided hydro, nephrotoxic agents, urosepsis, hypercalcemia, and volume depletion related to diarrhea as well as nephrotoxic agents.  Improving with IVF's.   2. A fib with RVR and hypotension- improved now that Dilt was resumed (was off dilt due to hypotension upon admission) 3. CKD stage 3-4 baseline Scr 0.6-2.3.   4. Metabolic acidosis- improved 5. Lymphoma- hold off on cisplat given worsening renal function. 6. Hypotension- related to volume depletion improved with IVF's but then occurred related to A fib and RVR.  Better today on dilt 7. Enterococcus UTI/urosepsis- on cubicin 8. Obstructive uropathy- s/p R Ureteral  stent. 9. Dispo- possible d/c to [facility] when stable. (her husband is in NH as well)  [name]  [name]

## 2013-09-04 NOTE — Progress Notes (Signed)
Pt heart rate  90-120's at times 140 nonsustained return to 110's. BP unchanged after Lopressor 2.5 mg administered. SRP, RN

## 2013-09-05 LAB — CBC WITH DIFFERENTIAL/PLATELET
BASOS PCT: 0 % (ref 0–1)
Basophils Absolute: 0 10*3/uL (ref 0.0–0.1)
Eosinophils Absolute: 0 10*3/uL (ref 0.0–0.7)
Eosinophils Relative: 0 % (ref 0–5)
HEMATOCRIT: 28.8 % — AB (ref 36.0–46.0)
HEMOGLOBIN: 9.6 g/dL — AB (ref 12.0–15.0)
LYMPHS ABS: 0.7 10*3/uL (ref 0.7–4.0)
LYMPHS PCT: 5 % — AB (ref 12–46)
MCH: 27.6 pg (ref 26.0–34.0)
MCHC: 33.3 g/dL (ref 30.0–36.0)
MCV: 82.8 fL (ref 78.0–100.0)
MONOS PCT: 17 % — AB (ref 3–12)
Monocytes Absolute: 2.4 10*3/uL — ABNORMAL HIGH (ref 0.1–1.0)
NEUTROS ABS: 11.2 10*3/uL — AB (ref 1.7–7.7)
Neutrophils Relative %: 78 % — ABNORMAL HIGH (ref 43–77)
Platelets: 166 10*3/uL (ref 150–400)
RBC: 3.48 MIL/uL — ABNORMAL LOW (ref 3.87–5.11)
RDW: 16.6 % — ABNORMAL HIGH (ref 11.5–15.5)
WBC: 14.3 10*3/uL — AB (ref 4.0–10.5)

## 2013-09-05 LAB — CULTURE, BLOOD (ROUTINE X 2)
Culture: NO GROWTH
Culture: NO GROWTH

## 2013-09-05 LAB — PROTIME-INR
INR: 3.02 — AB (ref 0.00–1.49)
Prothrombin Time: 30.2 seconds — ABNORMAL HIGH (ref 11.6–15.2)

## 2013-09-05 LAB — BASIC METABOLIC PANEL
BUN: 67 mg/dL — ABNORMAL HIGH (ref 6–23)
CO2: 22 meq/L (ref 19–32)
Calcium: 9.8 mg/dL (ref 8.4–10.5)
Chloride: 101 mEq/L (ref 96–112)
Creatinine, Ser: 2.96 mg/dL — ABNORMAL HIGH (ref 0.50–1.10)
GFR calc Af Amer: 17 mL/min — ABNORMAL LOW (ref >90)
GFR calc non Af Amer: 15 mL/min — ABNORMAL LOW (ref >90)
GLUCOSE: 180 mg/dL — AB (ref 70–99)
POTASSIUM: 3.7 meq/L (ref 3.7–5.3)
SODIUM: 138 meq/L (ref 137–147)

## 2013-09-05 LAB — GLUCOSE, CAPILLARY
GLUCOSE-CAPILLARY: 134 mg/dL — AB (ref 70–99)
GLUCOSE-CAPILLARY: 208 mg/dL — AB (ref 70–99)
Glucose-Capillary: 143 mg/dL — ABNORMAL HIGH (ref 70–99)

## 2013-09-05 MED ORDER — METOPROLOL TARTRATE 1 MG/ML IV SOLN
2.5000 mg | Freq: Once | INTRAVENOUS | Status: AC
  Administered 2013-09-05: 2.5 mg via INTRAVENOUS
  Filled 2013-09-05: qty 5

## 2013-09-05 MED ORDER — WARFARIN 0.5 MG HALF TABLET
0.5000 mg | ORAL_TABLET | Freq: Once | ORAL | Status: AC
  Administered 2013-09-05: 0.5 mg via ORAL
  Filled 2013-09-05: qty 1

## 2013-09-05 MED ORDER — SODIUM CHLORIDE 0.9 % IV BOLUS (SEPSIS)
250.0000 mL | Freq: Once | INTRAVENOUS | Status: AC
  Administered 2013-09-05: 250 mL via INTRAVENOUS

## 2013-09-05 NOTE — Progress Notes (Signed)
Physical Therapy Treatment Patient Details Name: [name] MRN: [id number] DOB: Jan 13, 1941 Today's Date: 09/05/2013    History of Present Illness 73 yo female admitted with acute on chronic renal failure, AMS, sepsis. Hx of SSS-pacemeaker, Afib, DM, COPD, gout, lymphoma.     PT Comments    Pt OOB in recliner.  With + 2 assist for safety amb pt in hallway 3 short distances of 12 feet with sitting breaks between.  Pt requires MAX encouragement to increase self participation. Limited amb distance as pt fears her "legs will give out",  "I have bad knees",  "both of them".    Follow Up Recommendations  SNF ([facility])     Equipment Recommendations       Recommendations for Other Services       Precautions / Restrictions Precautions Precautions: Fall Restrictions Weight Bearing Restrictions: No    Mobility  Bed Mobility               General bed mobility comments: Pt OOB in recliner   Transfers Overall transfer level: Needs assistance Equipment used: Rolling walker (2 wheeled) Transfers: Sit to/from Stand Sit to Stand: +2 safety/equipment;Min assist         General transfer comment: increased time and VC's on proper hand placement to increase self assist  Ambulation/Gait Ambulation/Gait assistance: +2 safety/equipment;Min assist Ambulation Distance (Feet): 36 Feet (12 feet x 3 sitting rest breaks required) Assistive device: Rolling walker (2 wheeled) Gait Pattern/deviations: Step-to pattern;Step-through pattern;Decreased step length - left;Decreased step length - right;Wide base of support;Shuffle Gait velocity: decreased    General Gait Details: max encouragement as pt is fearful her "legs will give out".  Recliner closely following    Stairs            Wheelchair Mobility    Modified Rankin (Stroke Patients Only)       Balance                                    Cognition                             Exercises      General Comments        Pertinent Vitals/Pain C/o B knee "aching"    Home Living                      Prior Function            PT Goals (current goals can now be found in the care plan section) Progress towards PT goals: Progressing toward goals    Frequency  Min 3X/week    PT Plan      Co-evaluation             End of Session Equipment Utilized During Treatment: Gait belt Activity Tolerance: Patient limited by fatigue Patient left: in chair;with call bell/phone within reach;with chair alarm set     Time: 1150-1217 PT Time Calculation (min): 27 min  Charges:  $Gait Training: 8-22 mins $Therapeutic Activity: 8-22 mins                    G Codes:      Rica Koyanagi  PTA [facility]  Acute  Rehab Pager      [phone]

## 2013-09-05 NOTE — Progress Notes (Signed)
[name]   DOB:17-Dec-1940   QB#:[id number]   IOX#:[id number]  Subjective:Patient denies any acute events.  She is sitting up in the chair today.   Objective:  Filed Vitals:   09/05/13 0412  BP: 113/56  Pulse: 85  Temp: 97.7 F (36.5 C)  Resp: 20    Body mass index is 41.52 kg/(m^2).  Intake/Output Summary (Last 24 hours) at 09/05/13 1030 Last data filed at 09/05/13 0900  Gross per 24 hour  Intake   1046 ml  Output   4725 ml  Net  -3679 ml    Oriented to self, place and time; Non-toxic appearing.   Sclerae unicteric  Oropharynx clear  No peripheral adenopathy  Lungs clear -- no rales or rhonchi  Regular S1 S2 without murmurs  Abdomen distended  MSK  No peripheral edema  Neuro, moves all her extremities.     CBG (last 3)   Recent Labs  09/04/13 1624 09/04/13 2113 09/05/13 0722  GLUCAP 187* 215* 143*     Labs:  Lab Results  Component Value Date   WBC 14.3* 09/05/2013   HGB 9.6* 09/05/2013   HCT 28.8* 09/05/2013   MCV 82.8 09/05/2013   PLT 166 09/05/2013   NEUTROABS 11.2* 2/68/3419    Basic Metabolic Panel:  Recent Labs Lab 09/01/13 0419 09/02/13 0500 09/03/13 0347 09/04/13 0435 09/05/13 0330  NA 134* 138 138 137 138  K 3.8 3.7 3.4* 3.5* 3.7  CL 102 106 105 102 101  CO2 15* 15* 16* 19 22  GLUCOSE 166* 138* 138* 151* 180*  BUN 78* 72* 69* 67* 67*  CREATININE 6.50* 5.39* 4.42* 3.61* 2.96*  CALCIUM 8.5 8.4 8.9 9.6 9.8   GFR Estimated Creatinine Clearance: 17.1 ml/min (by C-G formula based on Cr of 2.96). Liver Function Tests:  Recent Labs Lab 08/30/13 1111 08/30/13 1210 08/31/13 0345  AST 24 24 22   ALT 19 17 16   ALKPHOS 71 68 57  BILITOT 0.36 0.2* <0.2*  PROT 6.6 6.6 5.5*  ALBUMIN 3.4* 3.5 2.9*    Recent Labs Lab 08/30/13 1210  LIPASE 72*   No results found for this basename: AMMONIA,  in the last 168 hours Coagulation profile  Recent Labs Lab 08/30/13 1110  09/01/13 0419 09/02/13 0500 09/03/13 0347 09/04/13 0435  09/05/13 0330  INR Sent out for confirmation  < > 2.02* 2.04* 2.23* 2.62* 3.02*  PROTIME Sent out for confirmation.  --   --   --   --   --   --   < > = values in this interval not displayed.  CBC:  Recent Labs Lab 08/30/13 1210 08/31/13 0345 09/01/13 0419 09/02/13 0500 09/03/13 0347 09/04/13 0435 09/05/13 0330  WBC 1.8* 0.7* 0.8* 1.2* 3.2* 10.5 14.3*  NEUTROABS 0.1* 0.0*  --   --  2.1 7.8* 11.2*  HGB 10.3* 8.9* 8.3* 7.8* 8.5* 9.8* 9.6*  HCT 31.0* 26.9* 25.0* 23.7* 25.4* 28.9* 28.8*  MCV 83.3 84.1 83.3 84.6 83.3 82.6 82.8  PLT 188 156 166 139* 151 182 166   CBG:  Recent Labs Lab 09/04/13 0736 09/04/13 1156 09/04/13 1624 09/04/13 2113 09/05/13 0722  GLUCAP 130* 134* 187* 215* 143*   D-Dimer No results found for this basename: DDIMER,  in the last 72 hours Hgb A1c No results found for this basename: HGBA1C,  in the last 72 hours Microbiology Recent Results (from the past 240 hour(s))  CULTURE, BLOOD (ROUTINE X 2)     Status: None   Collection Time  08/30/13 12:35 PM      Result Value Ref Range Status   Specimen Description BLOOD RIGHT PORTA CATH   Final   Special Requests BOTTLES DRAWN AEROBIC AND ANAEROBIC 5 CC EA   Final   Culture  Setup Time     Final   Value: 08/30/2013 17:07     Performed at [facility]   Culture     Final   Value: NO GROWTH 5 DAYS     Performed at [facility]   Report Status 09/05/2013 FINAL   Final  CULTURE, BLOOD (ROUTINE X 2)     Status: None   Collection Time    08/30/13  1:24 PM      Result Value Ref Range Status   Specimen Description BLOOD LEFT ANTECUBITAL   Final   Special Requests BOTTLES DRAWN AEROBIC AND ANAEROBIC 5CC   Final   Culture  Setup Time     Final   Value: 08/30/2013 17:07     Performed at [facility]   Culture     Final   Value: NO GROWTH 5 DAYS     Performed at [facility]   Report Status 09/05/2013 FINAL   Final  URINE CULTURE     Status: None   Collection Time     08/30/13  1:25 PM      Result Value Ref Range Status   Specimen Description URINE, CATHETERIZED   Final   Special Requests NONE   Final   Culture  Setup Time     Final   Value: 08/31/2013 01:47     Performed at [facility] [place]     Final   Value: >=100,000 COLONIES/ML     Performed at [facility]   Culture     Final   Value: ENTEROCOCCUS SPECIES     Performed at [facility]   Report Status 09/01/2013 FINAL   Final   Organism ID, Bacteria ENTEROCOCCUS SPECIES   Final  MRSA PCR SCREENING     Status: None   Collection Time    08/30/13  5:08 PM      Result Value Ref Range Status   MRSA by PCR NEGATIVE  NEGATIVE Final   Comment:            The GeneXpert MRSA Assay (FDA     approved for NASAL specimens     only), is one component of a     comprehensive MRSA colonization     surveillance program. It is not     intended to diagnose MRSA     infection nor to guide or     monitor treatment for     MRSA infections.      Studies:  Dg Chest Port 1 View  09/04/2013   CLINICAL DATA:  Cough.  EXAM: PORTABLE CHEST - 1 VIEW  COMPARISON:  May 20th 2015.  FINDINGS: Stable mild cardiomegaly. Left-sided pacemaker in right subclavian Port-A-Cath are unchanged in position. Left lung is clear. Increased right lower lobe opacity is noted concerning for worsening subsegmental atelectasis or possibly pneumonia. Bony thorax appears intact. No pneumothorax or significant pleural effusion is noted.  IMPRESSION: Increased right lower lobe opacity is noted concerning for subsegmental atelectasis or pneumonia.   Electronically Signed   By: Sabino Dick M.D.   On: 09/04/2013 09:43    Assessment/Plan: 73 y.o.  1. AMS secondary to #2, #3, #4, resolved.  --She appears non-toxic.  Please continue antibiotics and fluids.  Urine cultures with enterococcus. Now on primaxin and cubucin.  Appears better clinically.    2. Acute of Chronic Renal Failure, resolving.  --Ultrasound  negative for chronic appearing changes or hydronephrosis.  Agree probable ATN secondary dehydration. Appreciate Renal. Creatinine slowly improving.   3. Hypercalcemia, resolved.  --Likely secondary to #4. Patient is not a candidate for bisphophonates secondary to #2. Hydration and low dose steroids. Calcium improved with hydration. Calcium 9.8 today.   4. 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  --HOLD further chemotherapy in the setting of above.   5. S/P R. Hydronephrosis, s/p R ureteral stent (04/03)  --Patient had R ureteral stent placed on 04/03.   6. Neutropenia, resolved.  -- Could be secondary to #4 with bone marrow involvement versus revlimid. WBC has recovered.  We will stop granix daily.   7. Anemia secondary to #4 or chemotherapy.  --Please try maintain a hemoglobin greater than 8.   8. Hypothyroidism: Levothyroxine.  9. Hypertension: On Metoprolol  10. Diabetes mellitus, type II: Metformin per PCP. Hold per renal function.  11. Hyperlipidemia: Pravastatin per PCP.  12.History of atrial fibrillation: Diltiazem, pacemaker. Coumadin being managed per her PCP previously.  13. COPD/ Chronic Bronchitis exacerbation.  --Patient advised to continue albuterol q 6 hours as needed.  14. Disposition. HCPOA are her neices and sister.  Full Code. Discharge to SNF.   Concha Norway, MD 09/05/2013  10:30 AM

## 2013-09-05 NOTE — Progress Notes (Signed)
Pt HR 150s at 1227, HR 156 at 1228, HR 156 at 1235. MD notified.  Will continue to monitor.

## 2013-09-05 NOTE — Progress Notes (Signed)
Patient ID: GEM CONKLE, female   DOB: [birth year], 73 y.o.   MRN: [id number] S:feels better O:BP 113/56  Pulse 85  Temp(Src) 97.7 F (36.5 C) (Oral)  Resp 20  Ht 4\' 11"  (1.499 m)  Wt 93.3 kg (205 lb 11 oz)  BMI 41.52 kg/m2  SpO2 98%  Intake/Output Summary (Last 24 hours) at 09/05/13 1235 Last data filed at 09/05/13 0900  Gross per 24 hour  Intake    946 ml  Output   4725 ml  Net  -3779 ml   Intake/Output: I/O last 3 completed shifts: In: 926 [P.O.:480; I.V.:246; IV Piggyback:200] Out: 7800 [Urine:7800]  Intake/Output this shift:  Total I/O In: 240 [P.O.:240] Out: -  Weight change: -2.908 kg (-6 lb 6.6 oz) Gen:WD [name]  CVS:no rub Resp:decreased bs at bases DZH:GDJME Ext:tr edema   Recent Labs Lab 08/30/13 1111  08/30/13 1210 08/30/13 2000 08/31/13 0345 09/01/13 0419 09/02/13 0500 09/03/13 0347 09/04/13 0435 09/05/13 0330  NA 132*  < > 130* 134* 133* 134* 138 138 137 138  K 5.5*  < > 5.7* 4.6 4.7 3.8 3.7 3.4* 3.5* 3.7  CL  --   < > 92* 98 99 102 106 105 102 101  CO2 14*  < > 14* 16* 16* 15* 15* 16* 19 22  GLUCOSE 103  < > 91 111* 113* 166* 138* 138* 151* 180*  BUN 86.3*  < > 85* 84* 80* 78* 72* 69* 67* 67*  CREATININE 7.6 Repeated and Verified*  < > 7.59* 7.37* 7.19* 6.50* 5.39* 4.42* 3.61* 2.96*  ALBUMIN 3.4*  --  3.5  --  2.9*  --   --   --   --   --   CALCIUM 11.7*  < > 11.5* 10.5 9.7 8.5 8.4 8.9 9.6 9.8  AST 24  --  24  --  22  --   --   --   --   --   ALT 19  --  17  --  16  --   --   --   --   --   < > = values in this interval not displayed. Liver Function Tests:  Recent Labs Lab 08/30/13 1111 08/30/13 1210 08/31/13 0345  AST 24 24 22   ALT 19 17 16   ALKPHOS 71 68 57  BILITOT 0.36 0.2* <0.2*  PROT 6.6 6.6 5.5*  ALBUMIN 3.4* 3.5 2.9*    Recent Labs Lab 08/30/13 1210  LIPASE 72*   No results found for this basename: AMMONIA,  in the last 168 hours CBC:  Recent Labs Lab 09/01/13 0419 09/02/13 0500 09/03/13 0347  09/04/13 0435 09/05/13 0330  WBC 0.8* 1.2* 3.2* 10.5 14.3*  NEUTROABS  --   --  2.1 7.8* 11.2*  HGB 8.3* 7.8* 8.5* 9.8* 9.6*  HCT 25.0* 23.7* 25.4* 28.9* 28.8*  MCV 83.3 84.6 83.3 82.6 82.8  PLT 166 139* 151 182 166   Cardiac Enzymes:  Recent Labs Lab 09/04/13 0435 09/04/13 0500  CKTOTAL 27  --   TROPONINI  --  <0.30   CBG:  Recent Labs Lab 09/04/13 1156 09/04/13 1624 09/04/13 2113 09/05/13 0722 09/05/13 1141  GLUCAP 134* 187* 215* 143* 134*    Iron Studies: No results found for this basename: IRON, TIBC, TRANSFERRIN, FERRITIN,  in the last 72 hours Studies/Results: Dg Chest Port 1 View  09/04/2013   CLINICAL DATA:  Cough.  EXAM: PORTABLE CHEST - 1 VIEW  COMPARISON:  May 20th 2015.  FINDINGS: Stable mild cardiomegaly. Left-sided pacemaker in right subclavian Port-A-Cath are unchanged in position. Left lung is clear. Increased right lower lobe opacity is noted concerning for worsening subsegmental atelectasis or possibly pneumonia. Bony thorax appears intact. No pneumothorax or significant pleural effusion is noted.  IMPRESSION: Increased right lower lobe opacity is noted concerning for subsegmental atelectasis or pneumonia.   Electronically Signed   By: Sabino Dick M.D.   On: 09/04/2013 09:43   . DAPTOmycin (CUBICIN)  IV  400 mg Intravenous Q48H  . dexamethasone  2 mg Oral BID WC  . diltiazem  180 mg Oral Daily  . imipenem-cilastatin  250 mg Intravenous Q12H  . insulin aspart  0-9 Units Subcutaneous TID WC  . levothyroxine  50 mcg Oral QAC breakfast  . metoprolol  2.5 mg Intravenous Once  . pravastatin  40 mg Oral q1800  . tiotropium  18 mcg Inhalation Daily  . warfarin  0.5 mg Oral ONCE-1800  . Warfarin - Pharmacist Dosing Inpatient   Does not apply q1800    BMET    Component Value Date/Time   NA 138 09/05/2013 0330   NA 132* 08/30/2013 1111   NA 141 10/12/2011 0957   K 3.7 09/05/2013 0330   K 5.5* 08/30/2013 1111   K 4.4 10/12/2011 0957   CL 101 09/05/2013 0330    CL 102 09/21/2012 0917   CL 95* 10/12/2011 0957   CO2 22 09/05/2013 0330   CO2 14* 08/30/2013 1111   CO2 29 10/12/2011 0957   GLUCOSE 180* 09/05/2013 0330   GLUCOSE 103 08/30/2013 1111   GLUCOSE 161* 09/21/2012 0917   GLUCOSE 131* 10/12/2011 0957   BUN 67* 09/05/2013 0330   BUN 86.3* 08/30/2013 1111   BUN 15 10/12/2011 0957   CREATININE 2.96* 09/05/2013 0330   CREATININE 7.6 Repeated and Verified* 08/30/2013 1111   CREATININE 1.10 04/19/2013 1420   CALCIUM 9.8 09/05/2013 0330   CALCIUM 11.7* 08/30/2013 1111   CALCIUM 9.1 10/12/2011 0957   GFRNONAA 15* 09/05/2013 0330   GFRNONAA 50* 04/19/2013 1420   GFRAA 17* 09/05/2013 0330   GFRAA 58* 04/19/2013 1420   CBC    Component Value Date/Time   WBC 14.3* 09/05/2013 0330   WBC 2.0* 08/30/2013 1110   RBC 3.48* 09/05/2013 0330   RBC 3.94 08/30/2013 1110   HGB 9.6* 09/05/2013 0330   HGB 10.8* 08/30/2013 1110   HCT 28.8* 09/05/2013 0330   HCT 34.1* 08/30/2013 1110   PLT 166 09/05/2013 0330   PLT 225 08/30/2013 1110   MCV 82.8 09/05/2013 0330   MCV 86.7 08/30/2013 1110   MCH 27.6 09/05/2013 0330   MCH 27.5 08/30/2013 1110   MCHC 33.3 09/05/2013 0330   MCHC 31.8 08/30/2013 1110   RDW 16.6* 09/05/2013 0330   RDW 18.0* 08/30/2013 1110   LYMPHSABS 0.7 09/05/2013 0330   LYMPHSABS 1.3 08/30/2013 1110   MONOABS 2.4* 09/05/2013 0330   MONOABS 0.4 08/30/2013 1110   EOSABS 0.0 09/05/2013 0330   EOSABS 0.1 08/30/2013 1110   BASOSABS 0.0 09/05/2013 0330   BASOSABS 0.0 08/30/2013 1110    Assessment/Plan:  1. AKI/CKD- multi-factorial with R-sided hydro, nephrotoxic agents, urosepsis, hypercalcemia, and volume depletion related to diarrhea as well as nephrotoxic agents. Improving with IVF's.  1. Continues to improve and heading toward her baseline 2. A fib with RVR and hypotension- improved now that Dilt was resumed (was off dilt due to hypotension upon admission) 3. CKD stage 3-4 baseline Scr 5.0-0.9.  4. Metabolic  acidosis- improved 5. Lymphoma- hold off on cisplat given worsening renal  function. 6. Hypotension- related to volume depletion improved with IVF's but then occurred related to A fib and RVR. Better today on dilt 7. Enterococcus UTI/urosepsis- on cubicin 8. Obstructive uropathy- s/p R Ureteral stent. 9. Dispo- possible d/c to [facility] when stable. (her husband is in NH as well) 10.   [place]

## 2013-09-05 NOTE — Progress Notes (Signed)
Pt HR 107, BP 107/50. Asymptomatic. Adm Metoprolol 2.5 mg. Will continue to monitor.

## 2013-09-05 NOTE — Progress Notes (Signed)
PROGRESS NOTE   [name] OEV:[id number] DOB: 02/06/1941 DOA: 08/30/2013 PCP: [name], MD  HPI [name] is a 73 y.o. female has a past medical history significant for sick sinus sd s/p pacemaker, A fib, DBCL currently undergoing chemotherapy followed by Dr. [name], presents to the ED with a chief complaint of weakness and dysuria.   Patient was admitted with severe sepsis with acute renal failure with Cr < 7, acute encephalopathy, neutropenic. She was initially admitted in SDU and was started on broad spectrum Abx with Vancomycin and Primaxin. Nephrology has been consulted for her renal failure. With IVF, Antibiotics and Neulasta her clinical condition improved and was transferred to floor on 5/23. His urinalysis grew Enterococcus sensitive to vancomycin and his Primaxin was discontinued. Patient with increasing cough over the past 2-3 days, CXR on 5/25 with concerns for PNA and Primaxin restarted.   Assessment/Plan: A fib with RVR - more tachycardic in the past 2 days, diltiazem restarted 5/25 am. Initially it was held due to hypotension and in the setting of sepsis. Continue Coumadin.  - IV metoprolol 2.5 once today - small NS bolus.  COPD - no wheezing or evidence of decompensation. Continue home medications  UTI - continue Cubicin, treat as complicated, will need ~82 days Severe sepsis with evidence of renal failure and lactic acidosis  - blood pressure improved, stable on the floor - change to Cubicin to avoid further renal damage, appreciate Dr. Jonnie Finner recommendations.  - blood cultures negative, pending - enterococcus in the urine, sensitive to Vancomycin. Recurrent diffuse B cell lymphoma - followed by Dr. [name]  Acute renal failure on CKD stage III - multifactorial, ?chemo induced, mild dehydration, Urinary infection  - no hydronephrosis on [place]  - slowly improving, Cr. Baseline 1.6-1.9 Hyperkalemia - due to renal failure. Kayexalate.  DM - stop  metformin, SSI. A1C 5.8 Neutropenia - chemotherapy induced. Monitor. Neupogen per oncology, discontinued on 5/25 after 2 days with ANC > 1500. Anemia - multifactorial, of chronic disease, on Revlemid. No evidence of bleeding, monitor.  Supratherapeutc INR - Coumadin per pharmacy, now within expected levels. Acute encephalopathy - in the setting of #1, 2, 4, improving SSS s/p PCM    Diet: Renal Fluids: NS DVT Prophylaxis: Coumadin  Code Status: Full Family Communication: d/w patient Disposition Plan: inpatient, SNF 2-3 days   Consultants:  Nephrology   Oncology   Procedures:  none   Antibiotics Vancomycin 5/20 >> 5/24 Primaxin 5/20 >> 5/23; 5/25 >> Cubicin 5/24 >>  HPI/Subjective: - no complaints this morning, breathing well, no chest pain, no palpitations  Objective: Filed Vitals:   09/04/13 0923 09/04/13 1453 09/04/13 2116 09/05/13 0412  BP:  105/52 97/58 113/56  Pulse:  85 117 85  Temp:  97.7 F (36.5 C) 98.5 F (36.9 C) 97.7 F (36.5 C)  TempSrc:  Oral Oral Oral  Resp:  20 20 20   Height:      Weight:    93.3 kg (205 lb 11 oz)  SpO2: 96% 99% 97% 98%    Intake/Output Summary (Last 24 hours) at 09/05/13 1310 Last data filed at 09/05/13 0900  Gross per 24 hour  Intake    946 ml  Output   4725 ml  Net  -3779 ml   Filed Weights   09/03/13 0552 09/04/13 0653 09/05/13 0412  Weight: 96 kg (211 lb 10.3 oz) 96.208 kg (212 lb 1.6 oz) 93.3 kg (205 lb 11 oz)   Exam:  General:  NAD  Cardiovascular: regular rate and rhythm, tachycardic  Respiratory: good air movement, clear to auscultation throughout, no wheezing, ronchi or rales  Abdomen: soft, not tender to palpation, positive bowel sounds  MSK: no peripheral edema  Neuro: non focal  Data Reviewed: Basic Metabolic Panel:  Recent Labs Lab 09/01/13 0419 09/02/13 0500 09/03/13 0347 09/04/13 0435 09/05/13 0330  NA 134* 138 138 137 138  K 3.8 3.7 3.4* 3.5* 3.7  CL 102 106 105 102 101  CO2 15*  15* 16* 19 22  GLUCOSE 166* 138* 138* 151* 180*  BUN 78* 72* 69* 67* 67*  CREATININE 6.50* 5.39* 4.42* 3.61* 2.96*  CALCIUM 8.5 8.4 8.9 9.6 9.8   Liver Function Tests:  Recent Labs Lab 08/30/13 1111 08/30/13 1210 08/31/13 0345  AST 24 24 22   ALT 19 17 16   ALKPHOS 71 68 57  BILITOT 0.36 0.2* <0.2*  PROT 6.6 6.6 5.5*  ALBUMIN 3.4* 3.5 2.9*    Recent Labs Lab 08/30/13 1210  LIPASE 72*   CBC:  Recent Labs Lab 08/30/13 1210 08/31/13 0345 09/01/13 0419 09/02/13 0500 09/03/13 0347 09/04/13 0435 09/05/13 0330  WBC 1.8* 0.7* 0.8* 1.2* 3.2* 10.5 14.3*  NEUTROABS 0.1* 0.0*  --   --  2.1 7.8* 11.2*  HGB 10.3* 8.9* 8.3* 7.8* 8.5* 9.8* 9.6*  HCT 31.0* 26.9* 25.0* 23.7* 25.4* 28.9* 28.8*  MCV 83.3 84.1 83.3 84.6 83.3 82.6 82.8  PLT 188 156 166 139* 151 182 166   Cardiac Enzymes:  Recent Labs Lab 09/04/13 0435 09/04/13 0500  CKTOTAL 27  --   TROPONINI  --  <0.30   BNP (last 3 results) No results found for this basename: PROBNP,  in the last 8760 hours CBG:  Recent Labs Lab 09/04/13 1156 09/04/13 1624 09/04/13 2113 09/05/13 0722 09/05/13 1141  GLUCAP 134* 187* 215* 143* 134*    Recent Results (from the past 240 hour(s))  CULTURE, BLOOD (ROUTINE X 2)     Status: None   Collection Time    08/30/13 12:35 PM      Result Value Ref Range Status   Specimen Description BLOOD RIGHT PORTA CATH   Final   Special Requests BOTTLES DRAWN AEROBIC AND ANAEROBIC 5 CC EA   Final   Culture  Setup Time     Final   Value: 08/30/2013 17:07     Performed at [facility]   Culture     Final   Value: NO GROWTH 5 DAYS     Performed at [facility]   Report Status 09/05/2013 FINAL   Final  CULTURE, BLOOD (ROUTINE X 2)     Status: None   Collection Time    08/30/13  1:24 PM      Result Value Ref Range Status   Specimen Description BLOOD LEFT ANTECUBITAL   Final   Special Requests BOTTLES DRAWN AEROBIC AND ANAEROBIC 5CC   Final   Culture  Setup Time     Final     Value: 08/30/2013 17:07     Performed at [facility]   Culture     Final   Value: NO GROWTH 5 DAYS     Performed at [facility]   Report Status 09/05/2013 FINAL   Final  URINE CULTURE     Status: None   Collection Time    08/30/13  1:25 PM      Result Value Ref Range Status   Specimen Description URINE, CATHETERIZED   Final   Special  Requests NONE   Final   Culture  Setup Time     Final   Value: 08/31/2013 01:47     Performed at [facility]     Final   Value: >=100,000 COLONIES/ML     Performed at [facility]   Culture     Final   Value: ENTEROCOCCUS SPECIES     Performed at [facility]   Report Status 09/01/2013 FINAL   Final   Organism ID, Bacteria ENTEROCOCCUS SPECIES   Final  MRSA PCR SCREENING     Status: None   Collection Time    08/30/13  5:08 PM      Result Value Ref Range Status   MRSA by PCR NEGATIVE  NEGATIVE Final   Comment:            The GeneXpert MRSA Assay (FDA     approved for NASAL specimens     only), is one component of a     comprehensive MRSA colonization     surveillance program. It is not     intended to diagnose MRSA     infection nor to guide or     monitor treatment for     MRSA infections.     Studies: Dg Chest Port 1 View  09/04/2013   CLINICAL DATA:  Cough.  EXAM: PORTABLE CHEST - 1 VIEW  COMPARISON:  May 20th 2015.  FINDINGS: Stable mild cardiomegaly. Left-sided pacemaker in right subclavian Port-A-Cath are unchanged in position. Left lung is clear. Increased right lower lobe opacity is noted concerning for worsening subsegmental atelectasis or possibly pneumonia. Bony thorax appears intact. No pneumothorax or significant pleural effusion is noted.  IMPRESSION: Increased right lower lobe opacity is noted concerning for subsegmental atelectasis or pneumonia.   Electronically Signed   By: Sabino Dick M.D.   On: 09/04/2013 09:43    Scheduled Meds: . DAPTOmycin (CUBICIN)  IV  400  mg Intravenous Q48H  . dexamethasone  2 mg Oral BID WC  . diltiazem  180 mg Oral Daily  . imipenem-cilastatin  250 mg Intravenous Q12H  . insulin aspart  0-9 Units Subcutaneous TID WC  . levothyroxine  50 mcg Oral QAC breakfast  . metoprolol  2.5 mg Intravenous Once  . pravastatin  40 mg Oral q1800  . sodium chloride  250 mL Intravenous Once  . tiotropium  18 mcg Inhalation Daily  . warfarin  0.5 mg Oral ONCE-1800  . Warfarin - Pharmacist Dosing Inpatient   Does not apply q1800   Continuous Infusions: . sodium chloride 10 mL/hr at 09/04/13 0830    Active Problems:   Cardiac pacemaker in situ   Diffuse large B cell lymphoma   Hypothyroidism   Diabetes mellitus type 2 in obese   Hyperlipidemia   Antineoplastic chemotherapy induced pancytopenia   Acute renal failure   COPD (chronic obstructive pulmonary disease)   UTI (lower urinary tract infection)   Severe sepsis   Sepsis secondary to UTI   Generalized weakness   Acute on chronic renal failure   CKD (chronic kidney disease) stage 3, GFR 30-59 ml/min   Acute encephalopathy   Neutropenia  Time spent: 35  This note has been created with Surveyor, quantity. Any transcriptional errors are unintentional.   Marzetta Board, MD [facility] Pager [phone]. If 7 PM - 7 AM, please contact night-coverage at [email or web address], password [facility] 09/05/2013, 1:10 PM  LOS: 6 days

## 2013-09-05 NOTE — Progress Notes (Signed)
[place] for Warfarin Indication: atrial fibrillation  Allergies  Allergen Reactions  . Codeine Nausea Only  . Penicillins     Unknown    Patient Measurements: Height: 4\' 11"  (149.9 cm) Weight: 205 lb 11 oz (93.3 kg) IBW/kg (Calculated) : 43.2  Vital Signs: Temp: 97.7 F (36.5 C) (05/26 0412) Temp src: Oral (05/26 0412) BP: 113/56 mmHg (05/26 0412) Pulse Rate: 85 (05/26 0412)  Labs:  Recent Labs  09/03/13 0347 09/04/13 0435 09/04/13 0500 09/05/13 0330  HGB 8.5* 9.8*  --  9.6*  HCT 25.4* 28.9*  --  28.8*  PLT 151 182  --  166  LABPROT 24.0* 27.1*  --  30.2*  INR 2.23* 2.62*  --  3.02*  CREATININE 4.42* 3.61*  --  2.96*  CKTOTAL  --  27  --   --   TROPONINI  --   --  <0.30  --     Estimated Creatinine Clearance: 17.1 ml/min (by C-G formula based on Cr of 2.96).   Assessment: 30 yoF admitted 5/20 with sepsis on chronic warfarin prior to admission for history of atrial fibrillation.  PMH also includes: UTI, recurrent diffuse B cell lymphoma, CKD, COPD, DM, neutropenia, anemia.  Pharmacy is consulted to continue warfarin dosing inpatient.  Supratherapeutic INR of 4.4 on admission.  PTA Warfarin 3 mg daily except 6 mg on Saturdays (per 08/18/13 anticoag clinic visit, recent dose reduction), last dose on 5/19  INR 3.02 today, slightly supratherapeutic today.   SCr improving daily, Renal following  CBC: Hgb and plts stable  No bleeding reported.  Renal diet, eating 25-100% of meals.  No drug interactions noted.   Goal of Therapy:  INR 2-3 Monitor platelets by anticoagulation protocol: Yes   Plan:   Warfarin 0.5 mg po once today.  INR is slightly supratherapeutic and want to avoid further INR increase so providing low dose today.  Patient received a low dose yesterday as well so anticipating INR decrease.  Daily PT/INR   Hershal Coria, PharmD, BCPS Pager: [phone] 09/05/2013 10:14 AM

## 2013-09-06 ENCOUNTER — Ambulatory Visit: Payer: Medicare Other

## 2013-09-06 ENCOUNTER — Other Ambulatory Visit: Payer: Medicare Other

## 2013-09-06 ENCOUNTER — Telehealth: Payer: Self-pay | Admitting: *Deleted

## 2013-09-06 DIAGNOSIS — N189 Chronic kidney disease, unspecified: Secondary | ICD-10-CM

## 2013-09-06 DIAGNOSIS — N390 Urinary tract infection, site not specified: Secondary | ICD-10-CM

## 2013-09-06 LAB — CBC
HCT: 28.1 % — ABNORMAL LOW (ref 36.0–46.0)
Hemoglobin: 9.6 g/dL — ABNORMAL LOW (ref 12.0–15.0)
MCH: 28.2 pg (ref 26.0–34.0)
MCHC: 34.2 g/dL (ref 30.0–36.0)
MCV: 82.6 fL (ref 78.0–100.0)
PLATELETS: 170 10*3/uL (ref 150–400)
RBC: 3.4 MIL/uL — AB (ref 3.87–5.11)
RDW: 16.7 % — AB (ref 11.5–15.5)
WBC: 12.8 10*3/uL — ABNORMAL HIGH (ref 4.0–10.5)

## 2013-09-06 LAB — COMPREHENSIVE METABOLIC PANEL
ALT: 16 U/L (ref 0–35)
AST: 13 U/L (ref 0–37)
Albumin: 2.8 g/dL — ABNORMAL LOW (ref 3.5–5.2)
Alkaline Phosphatase: 87 U/L (ref 39–117)
BUN: 71 mg/dL — ABNORMAL HIGH (ref 6–23)
CALCIUM: 10.4 mg/dL (ref 8.4–10.5)
CO2: 23 meq/L (ref 19–32)
CREATININE: 2.91 mg/dL — AB (ref 0.50–1.10)
Chloride: 99 mEq/L (ref 96–112)
GFR calc Af Amer: 17 mL/min — ABNORMAL LOW (ref >90)
GFR, EST NON AFRICAN AMERICAN: 15 mL/min — AB (ref >90)
Glucose, Bld: 177 mg/dL — ABNORMAL HIGH (ref 70–99)
Potassium: 3.8 mEq/L (ref 3.7–5.3)
Sodium: 138 mEq/L (ref 137–147)
Total Bilirubin: 0.2 mg/dL — ABNORMAL LOW (ref 0.3–1.2)
Total Protein: 5.2 g/dL — ABNORMAL LOW (ref 6.0–8.3)

## 2013-09-06 LAB — GLUCOSE, CAPILLARY
GLUCOSE-CAPILLARY: 192 mg/dL — AB (ref 70–99)
Glucose-Capillary: 226 mg/dL — ABNORMAL HIGH (ref 70–99)
Glucose-Capillary: 149 mg/dL — ABNORMAL HIGH (ref 70–99)
Glucose-Capillary: 184 mg/dL — ABNORMAL HIGH (ref 70–99)
Glucose-Capillary: 269 mg/dL — ABNORMAL HIGH (ref 70–99)

## 2013-09-06 LAB — PROTIME-INR
INR: 2.67 — ABNORMAL HIGH (ref 0.00–1.49)
Prothrombin Time: 27.5 seconds — ABNORMAL HIGH (ref 11.6–15.2)

## 2013-09-06 MED ORDER — LIDOCAINE-PRILOCAINE 2.5-2.5 % EX CREA
TOPICAL_CREAM | Freq: Once | CUTANEOUS | Status: DC
  Filled 2013-09-06: qty 5

## 2013-09-06 MED ORDER — WARFARIN SODIUM 2 MG PO TABS
2.0000 mg | ORAL_TABLET | Freq: Once | ORAL | Status: AC
  Administered 2013-09-06: 2 mg via ORAL
  Filled 2013-09-06: qty 1

## 2013-09-06 NOTE — Progress Notes (Signed)
Notified by CMT that pt had a T wave abnormality. Pt asymptomatic. VS and EKG obtained. VS WNL, except BP which was 105/49, although this has been normal for the pt. EKG shows Atrial pace rhythm and incomplete R BBB. Tylene Fantasia, NP notified. No new order obtained. Will continue to monitor.

## 2013-09-06 NOTE — Progress Notes (Signed)
[place] for Warfarin Indication: atrial fibrillation  Allergies  Allergen Reactions  . Codeine Nausea Only  . Penicillins     Unknown    Patient Measurements: Height: 4\' 11"  (149.9 cm) Weight: 205 lb 11 oz (93.3 kg) IBW/kg (Calculated) : 43.2  Vital Signs: Temp: 98.1 F (36.7 C) (05/27 0609) Temp src: Oral (05/27 0609) BP: 113/58 mmHg (05/27 0609) Pulse Rate: 70 (05/27 0609)  Labs:  Recent Labs  09/04/13 0435 09/04/13 0500 09/05/13 0330 09/06/13 0550  HGB 9.8*  --  9.6* 9.6*  HCT 28.9*  --  28.8* 28.1*  PLT 182  --  166 170  LABPROT 27.1*  --  30.2* 27.5*  INR 2.62*  --  3.02* 2.67*  CREATININE 3.61*  --  2.96* 2.91*  CKTOTAL 27  --   --   --   TROPONINI  --  <0.30  --   --     Estimated Creatinine Clearance: 17.4 ml/min (by C-G formula based on Cr of 2.91).   Assessment: 75 yoF admitted 5/20 with sepsis on chronic warfarin prior to admission for history of atrial fibrillation.  PMH also includes: UTI, recurrent diffuse B cell lymphoma, CKD, COPD, DM, neutropenia, anemia.  Pharmacy is consulted to continue warfarin dosing inpatient.  Supratherapeutic INR of 4.4 on admission.  PTA Warfarin 3 mg daily except 6 mg on Saturdays (per 08/18/13 anticoag clinic visit, recent dose reduction), last dose PTA on 5/19  INR 2.67 today, decreased into therapeutic range after providing lower doses for past two days  SCr improving daily, Renal following  CBC: Hgb and plts stable  No bleeding reported.  Renal diet, eating 25-100% of meals.  No drug interactions noted.   Goal of Therapy:  INR 2-3 Monitor platelets by anticoagulation protocol: Yes   Plan:   Warfarin 2 mg po once today.  Providing lower dose than home regimen since patient has appeared more sensitive to warfarin during this admission thus far.  Daily PT/INR   Hershal Coria, PharmD, BCPS Pager: [phone] 09/06/2013 8:28 AM

## 2013-09-06 NOTE — Telephone Encounter (Signed)
Received faxed request from [employer] for Revlimid.  However, patient is currently admitted (since 08-30-13). Gave fax to [facility] RN (Dr. [name])

## 2013-09-06 NOTE — Progress Notes (Signed)
Patient ID: [name], female   DOB: 11-29-1940, 73 y.o.   MRN: [id number] S:feells better but wants EMLA cream over portacath before they "clean it" O:BP 110/52  Pulse 70  Temp(Src) 98.1 F (36.7 C) (Oral)  Resp 18  Ht 4\' 11"  (1.499 m)  Wt 93.3 kg (205 lb 11 oz)  BMI 41.52 kg/m2  SpO2 96%  Intake/Output Summary (Last 24 hours) at 09/06/13 1339 Last data filed at 09/06/13 0910  Gross per 24 hour  Intake 697.33 ml  Output   4000 ml  Net -3302.67 ml   Intake/Output: I/O last 3 completed shifts: In: 1408.3 [P.O.:600; I.V.:400.3; IV Piggyback:408] Out: 7125 [Urine:7125]  Intake/Output this shift:  Total I/O In: 240 [P.O.:240] Out: -  Weight change: 0 kg (0 lb) Gen:WD obese [name] in NAd CVS:no rub Resp:decreased BS VEL:FYBOF Ext:tr edema   Recent Labs Lab 08/31/13 0345 09/01/13 0419 09/02/13 0500 09/03/13 0347 09/04/13 0435 09/05/13 0330 09/06/13 0550  NA 133* 134* 138 138 137 138 138  K 4.7 3.8 3.7 3.4* 3.5* 3.7 3.8  CL 99 102 106 105 102 101 99  CO2 16* 15* 15* 16* 19 22 23   GLUCOSE 113* 166* 138* 138* 151* 180* 177*  BUN 80* 78* 72* 69* 67* 67* 71*  CREATININE 7.19* 6.50* 5.39* 4.42* 3.61* 2.96* 2.91*  ALBUMIN 2.9*  --   --   --   --   --  2.8*  CALCIUM 9.7 8.5 8.4 8.9 9.6 9.8 10.4  AST 22  --   --   --   --   --  13  ALT 16  --   --   --   --   --  16   Liver Function Tests:  Recent Labs Lab 08/31/13 0345 09/06/13 0550  AST 22 13  ALT 16 16  ALKPHOS 57 87  BILITOT <0.2* 0.2*  PROT 5.5* 5.2*  ALBUMIN 2.9* 2.8*   No results found for this basename: LIPASE, AMYLASE,  in the last 168 hours No results found for this basename: AMMONIA,  in the last 168 hours CBC:  Recent Labs Lab 09/02/13 0500 09/03/13 0347 09/04/13 0435 09/05/13 0330 09/06/13 0550  WBC 1.2* 3.2* 10.5 14.3* 12.8*  NEUTROABS  --  2.1 7.8* 11.2*  --   HGB 7.8* 8.5* 9.8* 9.6* 9.6*  HCT 23.7* 25.4* 28.9* 28.8* 28.1*  MCV 84.6 83.3 82.6 82.8 82.6  PLT 139* 151 182 166  170   Cardiac Enzymes:  Recent Labs Lab 09/04/13 0435 09/04/13 0500  CKTOTAL 27  --   TROPONINI  --  <0.30   CBG:  Recent Labs Lab 09/05/13 1141 09/05/13 1643 09/05/13 2113 09/06/13 0729 09/06/13 1138  GLUCAP 134* 208* 226* 149* 192*    Iron Studies: No results found for this basename: IRON, TIBC, TRANSFERRIN, FERRITIN,  in the last 72 hours Studies/Results: No results found. Marland Kitchen DAPTOmycin (CUBICIN)  IV  400 mg Intravenous Q48H  . dexamethasone  2 mg Oral BID WC  . diltiazem  180 mg Oral Daily  . imipenem-cilastatin  250 mg Intravenous Q12H  . insulin aspart  0-9 Units Subcutaneous TID WC  . levothyroxine  50 mcg Oral QAC breakfast  . pravastatin  40 mg Oral q1800  . tiotropium  18 mcg Inhalation Daily  . warfarin  2 mg Oral ONCE-1800  . Warfarin - Pharmacist Dosing Inpatient   Does not apply q1800    BMET    Component Value Date/Time   NA  138 09/06/2013 0550   NA 132* 08/30/2013 1111   NA 141 10/12/2011 0957   K 3.8 09/06/2013 0550   K 5.5* 08/30/2013 1111   K 4.4 10/12/2011 0957   CL 99 09/06/2013 0550   CL 102 09/21/2012 0917   CL 95* 10/12/2011 0957   CO2 23 09/06/2013 0550   CO2 14* 08/30/2013 1111   CO2 29 10/12/2011 0957   GLUCOSE 177* 09/06/2013 0550   GLUCOSE 103 08/30/2013 1111   GLUCOSE 161* 09/21/2012 0917   GLUCOSE 131* 10/12/2011 0957   BUN 71* 09/06/2013 0550   BUN 86.3* 08/30/2013 1111   BUN 15 10/12/2011 0957   CREATININE 2.91* 09/06/2013 0550   CREATININE 7.6 Repeated and Verified* 08/30/2013 1111   CREATININE 1.10 04/19/2013 1420   CALCIUM 10.4 09/06/2013 0550   CALCIUM 11.7* 08/30/2013 1111   CALCIUM 9.1 10/12/2011 0957   GFRNONAA 15* 09/06/2013 0550   GFRNONAA 50* 04/19/2013 1420   GFRAA 17* 09/06/2013 0550   GFRAA 58* 04/19/2013 1420   CBC    Component Value Date/Time   WBC 12.8* 09/06/2013 0550   WBC 2.0* 08/30/2013 1110   RBC 3.40* 09/06/2013 0550   RBC 3.94 08/30/2013 1110   HGB 9.6* 09/06/2013 0550   HGB 10.8* 08/30/2013 1110   HCT 28.1* 09/06/2013 0550    HCT 34.1* 08/30/2013 1110   PLT 170 09/06/2013 0550   PLT 225 08/30/2013 1110   MCV 82.6 09/06/2013 0550   MCV 86.7 08/30/2013 1110   MCH 28.2 09/06/2013 0550   MCH 27.5 08/30/2013 1110   MCHC 34.2 09/06/2013 0550   MCHC 31.8 08/30/2013 1110   RDW 16.7* 09/06/2013 0550   RDW 18.0* 08/30/2013 1110   LYMPHSABS 0.7 09/05/2013 0330   LYMPHSABS 1.3 08/30/2013 1110   MONOABS 2.4* 09/05/2013 0330   MONOABS 0.4 08/30/2013 1110   EOSABS 0.0 09/05/2013 0330   EOSABS 0.1 08/30/2013 1110   BASOSABS 0.0 09/05/2013 0330   BASOSABS 0.0 08/30/2013 1110     Assessment/Plan:  1. AKI/CKD- multi-factorial with R-sided hydro, nephrotoxic agents, urosepsis, hypercalcemia, and volume depletion related to diarrhea as well as nephrotoxic agents. Improving with IVF's.  1. Continues to improve and heading toward her baseline 2. Continue to monitor Scr as an outpt weekly 3. F/u with our office on 10/02/13 at 8:30am. 2. A fib with RVR and hypotension- improved now that Dilt was resumed (was off dilt due to hypotension upon admission) 3. CKD stage 3-4 baseline Scr 5.9-5.6.  4. Metabolic acidosis- improved 5. Lymphoma- hold off on cisplat given worsening renal function. 6. Hypotension- related to volume depletion improved with IVF's but then occurred related to A fib and RVR. Better today on dilt 7. Enterococcus UTI/urosepsis- on cubicin 8. Obstructive uropathy- s/p R Ureteral stent. 9. Dispo- possible d/c to [facility] when stable. (her husband is in NH as well).  Will sign off with above follow up. [place]

## 2013-09-06 NOTE — Progress Notes (Signed)
Physical Therapy Treatment Patient Details Name: LEZLIE RITCHEY MRN: [id number] DOB: February 27, 1941 Today's Date: 09/06/2013    History of Present Illness 73 yo female admitted with acute on chronic renal failure, AMS, sepsis. Hx of SSS-pacemeaker, Afib, DM, COPD, gout, lymphoma.     PT Comments    Pt agreeable to ambulate only to door of room due to increased fatigue today however also performed exercises in recliner.  Pt requires increased time as she mobilizes at her own pace also needs frequent redirection to focus on task at hand (very talkative).  Pt plans to d/c to SNF.   Follow Up Recommendations  SNF     Equipment Recommendations  None recommended by PT    Recommendations for Other Services       Precautions / Restrictions Precautions Precautions: Fall    Mobility  Bed Mobility Overal bed mobility: Needs Assistance Bed Mobility: Supine to Sit     Supine to sit: Min assist     General bed mobility comments: assist for scooting hips to EOB, utilized rails for UE assistance, increased time  Transfers Overall transfer level: Needs assistance Equipment used: Rolling walker (2 wheeled) Transfers: Sit to/from Stand Sit to Stand: +2 safety/equipment;Min assist         General transfer comment: increased time, pt held onto RW so educated to make sure person holding RW steady upon rise, able to use hands on armrest to control descent  Ambulation/Gait Ambulation/Gait assistance: +2 safety/equipment;Min assist Ambulation Distance (Feet): 10 Feet Assistive device: Rolling walker (2 wheeled) Gait Pattern/deviations: Step-through pattern;Decreased stride length;Wide base of support;Trunk flexed Gait velocity: decreased    General Gait Details: continues to report being fearful of "legs will give out" so pt self limited distance, despite recliner closely following   Stairs            Wheelchair Mobility    Modified Rankin (Stroke Patients Only)        Balance                                    Cognition Arousal/Alertness: Awake/alert Behavior During Therapy: WFL for tasks assessed/performed Overall Cognitive Status: Within Functional Limits for tasks assessed                      Exercises General Exercises - Lower Extremity Ankle Circles/Pumps: AROM;Both;15 reps Quad Sets: AROM;Both;10 reps Heel Slides: AROM;Both;10 reps Hip ABduction/ADduction: AROM;Both;10 reps Straight Leg Raises: AROM;Both;10 reps    General Comments        Pertinent Vitals/Pain Activity to tolerance, repositioned to comfort in recliner    Home Living                      Prior Function            PT Goals (current goals can now be found in the care plan section) Progress towards PT goals: Progressing toward goals    Frequency  Min 3X/week    PT Plan Current plan remains appropriate    Co-evaluation             End of Session   Activity Tolerance: Patient limited by fatigue Patient left: in chair;with call bell/phone within reach;with chair alarm set     Time: 0950-1019 PT Time Calculation (min): 29 min  Charges:  $Gait Training: 8-22 mins $Therapeutic Exercise: 8-22 mins  G CodesJunius Argyle 09/06/2013, 2:22 PM Carmelia Bake, PT, DPT 09/06/2013 Pager: [phone]

## 2013-09-06 NOTE — Progress Notes (Addendum)
PROGRESS NOTE   [name] Paschal ZOX:[id number] DOB: 1940/06/02 DOA: 08/30/2013 PCP: [name], MD  HPI [name] is a 73 y.o. female has a past medical history significant for sick sinus sd s/p pacemaker, A fib, DBCL currently undergoing chemotherapy followed by Dr. [name], presents to the ED with a chief complaint of weakness and dysuria.   Patient was admitted with severe sepsis with acute renal failure with Cr < 7, acute encephalopathy, neutropenic. She was initially admitted in SDU and was started on broad spectrum Abx with Vancomycin and Primaxin. Nephrology has been consulted for her renal failure. With IVF, Antibiotics and Neulasta her clinical condition improved and was transferred to floor on 5/23. His urinalysis grew Enterococcus sensitive to vancomycin and his Primaxin was discontinued. Patient with increasing cough over the past 2-3 days, CXR on 5/25 with concerns for PNA and Primaxin restarted.   Assessment/Plan: A fib with RVR - Cardizem initially had to be held secondary to hypotension and hence caused RVR. - Cardizem resumed with improved control. Currently with sinus rhythm/a paced and occasional PAF with RVR.  - Coumadin per pharmacy.  COPD - no wheezing or evidence of decompensation. Continue home medications   Enterococcus UTI  - continue Cubicin, treat as complicated, will need ~81 days  Severe sepsis with evidence of renal failure and lactic acidosis - Secondary to UTI +/- pneumonia  - blood pressure improved, stable on the floor - changed to Cubicin to avoid further renal damage, appreciate nephrology followup.  - blood cultures negative, pending - enterococcus in the urine, sensitive to Vancomycin. - On Primaxin since 5/20. Chest x-ray 5/25-? Right lower lobe atelectasis versus pneumonia. Followup chest x-ray in a.m. 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  [name] saw patient today and he recommends holding further chemotherapy at this time.  Acute renal failure on CKD stage III-IV  - multifactorial, right-sided hydronephrosis (status post right ureteral stent 4/03), nephrotoxic agents, urosepsis, hypercalcemia and volume depletion related to diarrhea. - slowly improving, Cr. Baseline 1.6-1.9 - Nephrology continued to follow inpatient. They have seen her today and recommend monitor serum creatinine as outpatient weekly and have arranged for followup in their office on 10/02/13 at 8:30 AM.  Hyperkalemia  - due to renal failure. Resolved after Kayexalate.   Hypercalcemia -Resolved   DM  - stop metformin, SSI. A1C 5.8  Neutropenia  - chemotherapy induced. Monitor. Neupogen per oncology, discontinued on 5/25 after 2 days with ANC > 1500.  Anemia  - multifactorial, of chronic disease, on Revlemid. No evidence of bleeding, monitor.   Supratherapeutc INR  - Coumadin per pharmacy, now within expected levels.  Acute encephalopathy  - in the setting of above acute illnesses. Resolved  SSS  - s/p PCM    Diet: Renal Fluids: NS DVT Prophylaxis: Coumadin  Code Status: Full Family Communication: d/w patient Disposition Plan: inpatient, SNF 1-2 days   Consultants:  Nephrology   Oncology   Procedures:  none   Antibiotics Vancomycin 5/20 >> 5/24 Primaxin 5/20 >> 5/23; 5/25 >> Cubicin 5/24 >>  HPI/Subjective: Mild intermittent dyspnea but no cough or chest pain. States that she's feeling much better. Denies complaints.  Objective: Filed Vitals:   09/05/13 2316 09/06/13 0609 09/06/13 0907 09/06/13 1338  BP: 105/49 113/58 110/52 98/72  Pulse: 70 70  72  Temp:  98.1 F (36.7 C)  97.3 F (36.3 C)  TempSrc:  Oral  Oral  Resp: 12 18  18   Height:      Weight:  93.3 kg (205 lb 11 oz)    SpO2: 97% 96%  95%    Intake/Output Summary (Last 24 hours) at 09/06/13 1436 Last data filed at 09/06/13 1344  Gross per 24 hour  Intake  937.33 ml  Output   3450 ml  Net -2512.67 ml   Filed Weights   09/04/13 0653 09/05/13 0412 09/06/13 0609  Weight: 96.208 kg (212 lb 1.6 oz) 93.3 kg (205 lb 11 oz) 93.3 kg (205 lb 11 oz)   Exam:  General:  NAD. Pleasant elderly female seen sitting up and eating ice cream this morning.   Cardiovascular: regular rate and rhythm, tachycardic  Respiratory: good air movement, occasional basal crackles but otherwise clear to auscultation, no wheezing, ronchi or rales  Abdomen: soft, not tender to palpation, positive bowel sounds  MSK: no peripheral edema  Neuro: non focal  Data Reviewed: Basic Metabolic Panel:  Recent Labs Lab 09/02/13 0500 09/03/13 0347 09/04/13 0435 09/05/13 0330 09/06/13 0550  NA 138 138 137 138 138  K 3.7 3.4* 3.5* 3.7 3.8  CL 106 105 102 101 99  CO2 15* 16* 19 22 23   GLUCOSE 138* 138* 151* 180* 177*  BUN 72* 69* 67* 67* 71*  CREATININE 5.39* 4.42* 3.61* 2.96* 2.91*  CALCIUM 8.4 8.9 9.6 9.8 10.4   Liver Function Tests:  Recent Labs Lab 08/31/13 0345 09/06/13 0550  AST 22 13  ALT 16 16  ALKPHOS 57 87  BILITOT <0.2* 0.2*  PROT 5.5* 5.2*  ALBUMIN 2.9* 2.8*   No results found for this basename: LIPASE, AMYLASE,  in the last 168 hours CBC:  Recent Labs Lab 08/31/13 0345  09/02/13 0500 09/03/13 0347 09/04/13 0435 09/05/13 0330 09/06/13 0550  WBC 0.7*  < > 1.2* 3.2* 10.5 14.3* 12.8*  NEUTROABS 0.0*  --   --  2.1 7.8* 11.2*  --   HGB 8.9*  < > 7.8* 8.5* 9.8* 9.6* 9.6*  HCT 26.9*  < > 23.7* 25.4* 28.9* 28.8* 28.1*  MCV 84.1  < > 84.6 83.3 82.6 82.8 82.6  PLT 156  < > 139* 151 182 166 170  < > = values in this interval not displayed. Cardiac Enzymes:  Recent Labs Lab 09/04/13 0435 09/04/13 0500  CKTOTAL 27  --   TROPONINI  --  <0.30   BNP (last 3 results) No results found for this basename: PROBNP,  in the last 8760 hours CBG:  Recent Labs Lab 09/05/13 1141 09/05/13 1643 09/05/13 2113 09/06/13 0729 09/06/13 1138  GLUCAP  134* 208* 226* 149* 192*    Recent Results (from the past 240 hour(s))  CULTURE, BLOOD (ROUTINE X 2)     Status: None   Collection Time    08/30/13 12:35 PM      Result Value Ref Range Status   Specimen Description BLOOD RIGHT PORTA CATH   Final   Special Requests BOTTLES DRAWN AEROBIC AND ANAEROBIC 5 CC EA   Final   Culture  Setup Time     Final   Value: 08/30/2013 17:07     Performed at [facility]   Culture     Final   Value: NO GROWTH 5 DAYS     Performed at [facility]   Report Status 09/05/2013 FINAL   Final  CULTURE, BLOOD (ROUTINE X 2)     Status: None   Collection Time    08/30/13  1:24  PM      Result Value Ref Range Status   Specimen Description BLOOD LEFT ANTECUBITAL   Final   Special Requests BOTTLES DRAWN AEROBIC AND ANAEROBIC 5CC   Final   Culture  Setup Time     Final   Value: 08/30/2013 17:07     Performed at [facility]   Culture     Final   Value: NO GROWTH 5 DAYS     Performed at [facility]   Report Status 09/05/2013 FINAL   Final  URINE CULTURE     Status: None   Collection Time    08/30/13  1:25 PM      Result Value Ref Range Status   Specimen Description URINE, CATHETERIZED   Final   Special Requests NONE   Final   Culture  Setup Time     Final   Value: 08/31/2013 01:47     Performed at [facility]     Final   Value: >=100,000 COLONIES/ML     Performed at [facility]   Culture     Final   Value: ENTEROCOCCUS SPECIES     Performed at [facility]   Report Status 09/01/2013 FINAL   Final   Organism ID, Bacteria ENTEROCOCCUS SPECIES   Final  MRSA PCR SCREENING     Status: None   Collection Time    08/30/13  5:08 PM      Result Value Ref Range Status   MRSA by PCR NEGATIVE  NEGATIVE Final   Comment:            The GeneXpert MRSA Assay (FDA     approved for NASAL specimens     only), is one component of a     comprehensive MRSA colonization     surveillance program.  It is not     intended to diagnose MRSA     infection nor to guide or     monitor treatment for     MRSA infections.     Studies: No results found.  Scheduled Meds: . DAPTOmycin (CUBICIN)  IV  400 mg Intravenous Q48H  . dexamethasone  2 mg Oral BID WC  . diltiazem  180 mg Oral Daily  . imipenem-cilastatin  250 mg Intravenous Q12H  . insulin aspart  0-9 Units Subcutaneous TID WC  . levothyroxine  50 mcg Oral QAC breakfast  . lidocaine-prilocaine   Topical Once  . pravastatin  40 mg Oral q1800  . tiotropium  18 mcg Inhalation Daily  . warfarin  2 mg Oral ONCE-1800  . Warfarin - Pharmacist Dosing Inpatient   Does not apply q1800   Continuous Infusions: . sodium chloride 10 mL/hr at 09/04/13 0830    Active Problems:   Cardiac pacemaker in situ   Diffuse large B cell lymphoma   Hypothyroidism   Diabetes mellitus type 2 in obese   Hyperlipidemia   Antineoplastic chemotherapy induced pancytopenia   Acute renal failure   COPD (chronic obstructive pulmonary disease)   UTI (lower urinary tract infection)   Severe sepsis   Sepsis secondary to UTI   Generalized weakness   Acute on chronic renal failure   CKD (chronic kidney disease) stage 3, GFR 30-59 ml/min   Acute encephalopathy   Neutropenia  Time spent: 35  This note has been created with Surveyor, quantity. Any transcriptional errors are unintentional.   Modena Jansky, MD, FACP,  FHM. [facility] Pager [phone]  If 7PM-7AM, please contact night-coverage [email or web address] Password [email or web address] 09/06/2013, 2:45 PM  09/06/2013, 2:36 PM  LOS: 7 days

## 2013-09-06 NOTE — Progress Notes (Signed)
ANTIBIOTIC CONSULT NOTE - Follow Up  Pharmacy Consult for Primaxin Indication: HCAP  Allergies  Allergen Reactions  . Codeine Nausea Only  . Penicillins     Unknown    Patient Measurements: Height: 4\' 11"  (149.9 cm) Weight: 205 lb 11 oz (93.3 kg) IBW/kg (Calculated) : 43.2  Vital Signs: Temp: 98.1 F (36.7 C) (05/27 0609) Temp src: Oral (05/27 0609) BP: 113/58 mmHg (05/27 0609) Pulse Rate: 70 (05/27 0609) Intake/Output from previous day: 05/26 0701 - 05/27 0700 In: 1037.3 [P.O.:480; I.V.:249.3; IV Piggyback:308] Out: 4000 [Urine:4000] Intake/Output from this shift:    Labs:  Recent Labs  09/04/13 0435 09/05/13 0330 09/06/13 0550  WBC 10.5 14.3* 12.8*  HGB 9.8* 9.6* 9.6*  PLT 182 166 170  CREATININE 3.61* 2.96* 2.91*   Estimated Creatinine Clearance: 17.4 ml/min (by C-G formula based on Cr of 2.91). No results found for this basename: Letta Median, [place], GENTTROUGH, Daisy, GENTRANDOM, TOBRATROUGH, TOBRAPEAK, TOBRARND, AMIKACINPEAK, AMIKACINTROU, AMIKACIN,  in the last 72 hours   Anti-infectives: 5/20 >> Vancomycin >> 5/24 5/20 >> Primaxin >> 5/23, resume 5/25 >> 5/24 >> Daptomycin >>  Microbiology: 5/20 MRSA PCR: neg 5/20 blood x2: NGF 5/20 urine: enterococcus species (R- Amp/LVQ/Tetra, S- Vanc)  Assessment: 73 yo female with lymphoma, being treated with broad spectrum antibiotics for sepsis d/t UTI - on daptomycin for enterococcal coverage. Primaxin resumed 5/25 for possible HCAP (cough, sputum, CXR 5/25 concerning for PNA).  Tmax: AF WBCs: 12.8 (on decadron from PTA, Granix d/c'd 5/25)  Renal: AoCKD, SCr 2.91 (improving, baseline 1.7-2.1), CrCl ~25 ml/min (CG), ~19 ml/min (normalized), UOP good 1.8 ml/kg/hr Lactic acid: 2.3 (5/20) Baseline CK WNL, will check weekly while on daptomycin  Goal of Therapy:  Dose per weight and renal function  Plan:   Continue Daptomycin 400 mg (~4 mg/kg TBW) q48h for CrCl 10-30 ml/min.  Continue  Primaxin 250mg  IV q12h.  Follow renal function and adjust as needed.  Hershal Coria, PharmD, BCPS Pager: [phone] 09/06/2013 8:24 AM

## 2013-09-06 NOTE — Progress Notes (Signed)
[name]   DOB:10-08-1940   RW#:[id number]   PYP#:[id number]  Subjective:Patient denies any acute events.  She is laying in bed eating her breakfast. She had a large bowel movement following her enema yesterday.  She awaits placement to [facility].   Objective:  Filed Vitals:   09/06/13 0609  BP: 113/58  Pulse: 70  Temp: 98.1 F (36.7 C)  Resp: 18    Body mass index is 41.52 kg/(m^2).  Intake/Output Summary (Last 24 hours) at 09/06/13 0913 Last data filed at 09/06/13 0656  Gross per 24 hour  Intake 797.33 ml  Output   4000 ml  Net -3202.67 ml    Oriented to self, place and time; Non-toxic appearing.   Sclerae unicteric  Oropharynx clear  No peripheral adenopathy  Lungs clear -- no rales or rhonchi  Regular S1 S2 without murmurs  Abdomen distended  MSK  No peripheral edema  Neuro, moves all her extremities.     CBG (last 3)   Recent Labs  09/05/13 1643 09/05/13 2113 09/06/13 0729  GLUCAP 208* 226* 149*     Labs:  Lab Results  Component Value Date   WBC 12.8* 09/06/2013   HGB 9.6* 09/06/2013   HCT 28.1* 09/06/2013   MCV 82.6 09/06/2013   PLT 170 09/06/2013   NEUTROABS 11.2* 2/45/8099    Basic Metabolic Panel:  Recent Labs Lab 09/02/13 0500 09/03/13 0347 09/04/13 0435 09/05/13 0330 09/06/13 0550  NA 138 138 137 138 138  K 3.7 3.4* 3.5* 3.7 3.8  CL 106 105 102 101 99  CO2 15* 16* 19 22 23   GLUCOSE 138* 138* 151* 180* 177*  BUN 72* 69* 67* 67* 71*  CREATININE 5.39* 4.42* 3.61* 2.96* 2.91*  CALCIUM 8.4 8.9 9.6 9.8 10.4   GFR Estimated Creatinine Clearance: 17.4 ml/min (by C-G formula based on Cr of 2.91). Liver Function Tests:  Recent Labs Lab 08/30/13 1111 08/30/13 1210 08/31/13 0345 09/06/13 0550  AST 24 24 22 13   ALT 19 17 16 16   ALKPHOS 71 68 57 87  BILITOT 0.36 0.2* <0.2* 0.2*  PROT 6.6 6.6 5.5* 5.2*  ALBUMIN 3.4* 3.5 2.9* 2.8*    Recent Labs Lab 08/30/13 1210  LIPASE 72*   No results found for this basename: AMMONIA,  in  the last 168 hours Coagulation profile  Recent Labs Lab 08/30/13 1110  09/02/13 0500 09/03/13 0347 09/04/13 0435 09/05/13 0330 09/06/13 0550  INR Sent out for confirmation  < > 2.04* 2.23* 2.62* 3.02* 2.67*  PROTIME Sent out for confirmation.  --   --   --   --   --   --   < > = values in this interval not displayed.  CBC:  Recent Labs Lab 08/30/13 1210 08/31/13 0345  09/02/13 0500 09/03/13 0347 09/04/13 0435 09/05/13 0330 09/06/13 0550  WBC 1.8* 0.7*  < > 1.2* 3.2* 10.5 14.3* 12.8*  NEUTROABS 0.1* 0.0*  --   --  2.1 7.8* 11.2*  --   HGB 10.3* 8.9*  < > 7.8* 8.5* 9.8* 9.6* 9.6*  HCT 31.0* 26.9*  < > 23.7* 25.4* 28.9* 28.8* 28.1*  MCV 83.3 84.1  < > 84.6 83.3 82.6 82.8 82.6  PLT 188 156  < > 139* 151 182 166 170  < > = values in this interval not displayed. CBG:  Recent Labs Lab 09/05/13 0722 09/05/13 1141 09/05/13 1643 09/05/13 2113 09/06/13 0729  GLUCAP 143* 134* 208* 226* 149*   D-Dimer No results found  for this basename: DDIMER,  in the last 72 hours Hgb A1c No results found for this basename: HGBA1C,  in the last 72 hours Microbiology Recent Results (from the past 240 hour(s))  CULTURE, BLOOD (ROUTINE X 2)     Status: None   Collection Time    08/30/13 12:35 PM      Result Value Ref Range Status   Specimen Description BLOOD RIGHT PORTA CATH   Final   Special Requests BOTTLES DRAWN AEROBIC AND ANAEROBIC 5 CC EA   Final   Culture  Setup Time     Final   Value: 08/30/2013 17:07     Performed at [facility]   Culture     Final   Value: NO GROWTH 5 DAYS     Performed at [facility]   Report Status 09/05/2013 FINAL   Final  CULTURE, BLOOD (ROUTINE X 2)     Status: None   Collection Time    08/30/13  1:24 PM      Result Value Ref Range Status   Specimen Description BLOOD LEFT ANTECUBITAL   Final   Special Requests BOTTLES DRAWN AEROBIC AND ANAEROBIC 5CC   Final   Culture  Setup Time     Final   Value: 08/30/2013 17:07     Performed  at [facility]   Culture     Final   Value: NO GROWTH 5 DAYS     Performed at [facility]   Report Status 09/05/2013 FINAL   Final  URINE CULTURE     Status: None   Collection Time    08/30/13  1:25 PM      Result Value Ref Range Status   Specimen Description URINE, CATHETERIZED   Final   Special Requests NONE   Final   Culture  Setup Time     Final   Value: 08/31/2013 01:47     Performed at [place] [facility]     Final   Value: >=100,000 COLONIES/ML     Performed at [facility]   Culture     Final   Value: ENTEROCOCCUS SPECIES     Performed at [facility]   Report Status 09/01/2013 FINAL   Final   Organism ID, Bacteria ENTEROCOCCUS SPECIES   Final  MRSA PCR SCREENING     Status: None   Collection Time    08/30/13  5:08 PM      Result Value Ref Range Status   MRSA by PCR NEGATIVE  NEGATIVE Final   Comment:            The GeneXpert MRSA Assay (FDA     approved for NASAL specimens     only), is one component of a     comprehensive MRSA colonization     surveillance program. It is not     intended to diagnose MRSA     infection nor to guide or     monitor treatment for     MRSA infections.      Studies:  No results found.  Assessment/Plan: 73 y.o.  1. AMS secondary to #2, #3, #4, resolving.  --She appears non-toxic. Still has some delay in her responses but much improved since admission.   Please continue antibiotics and fluids.  Urine cultures with enterococcus. Now on cubucin.  Appears better clinically.    2. Acute of Chronic Renal Failure, resolving.  --Ultrasound negative for chronic appearing changes or hydronephrosis.  Agree probable ATN secondary dehydration. Appreciate Renal. Creatinine slowly improving. 2.91 today.   3. Hypercalcemia, resolved.  --Likely secondary to #4. Patient is not a candidate for bisphophonates secondary to #2. Hydration and low dose steroids. Calcium improved with hydration.  Uncorrected Calcium 10.4 today.   4. 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  --HOLD further chemotherapy in the setting of above.   5. S/P R. Hydronephrosis, s/p R ureteral stent (04/03)  --Patient had R ureteral stent placed on 04/03.   6. Neutropenia, resolved.  -- Could be secondary to #4 with bone marrow involvement versus revlimid. WBC has recovered.   7. Anemia secondary to #4 or chemotherapy.  --Please try maintain a hemoglobin greater than 8.   8. Hypothyroidism: Levothyroxine.  9. Hypertension: On Metoprolol  10. Diabetes mellitus, type II: Metformin per PCP. Hold per renal function.  11. Hyperlipidemia: Pravastatin per PCP.  12.History of atrial fibrillation: Diltiazem, pacemaker. Coumadin being managed per her PCP previously.  13. COPD/ Chronic Bronchitis exacerbation.  --Patient advised to continue albuterol q 6 hours as needed.  14. Disposition. HCPOA are her neices and sister.  Full Code. Discharge to SNF.   Concha Norway, MD 09/06/2013  9:13 AM

## 2013-09-07 ENCOUNTER — Other Ambulatory Visit: Payer: Self-pay | Admitting: Internal Medicine

## 2013-09-07 ENCOUNTER — Inpatient Hospital Stay (HOSPITAL_COMMUNITY): Payer: Medicare Other

## 2013-09-07 ENCOUNTER — Telehealth: Payer: Self-pay | Admitting: Internal Medicine

## 2013-09-07 ENCOUNTER — Encounter: Payer: Self-pay | Admitting: Internal Medicine

## 2013-09-07 LAB — BASIC METABOLIC PANEL
BUN: 68 mg/dL — ABNORMAL HIGH (ref 6–23)
CO2: 24 mEq/L (ref 19–32)
CREATININE: 2.58 mg/dL — AB (ref 0.50–1.10)
Calcium: 10.6 mg/dL — ABNORMAL HIGH (ref 8.4–10.5)
Chloride: 98 mEq/L (ref 96–112)
GFR calc non Af Amer: 17 mL/min — ABNORMAL LOW (ref >90)
GFR, EST AFRICAN AMERICAN: 20 mL/min — AB (ref >90)
Glucose, Bld: 188 mg/dL — ABNORMAL HIGH (ref 70–99)
POTASSIUM: 3.7 meq/L (ref 3.7–5.3)
Sodium: 137 mEq/L (ref 137–147)

## 2013-09-07 LAB — PROTIME-INR
INR: 2.38 — ABNORMAL HIGH (ref 0.00–1.49)
PROTHROMBIN TIME: 25.2 s — AB (ref 11.6–15.2)

## 2013-09-07 LAB — GLUCOSE, CAPILLARY
GLUCOSE-CAPILLARY: 186 mg/dL — AB (ref 70–99)
Glucose-Capillary: 155 mg/dL — ABNORMAL HIGH (ref 70–99)
Glucose-Capillary: 187 mg/dL — ABNORMAL HIGH (ref 70–99)

## 2013-09-07 MED ORDER — INSULIN ASPART 100 UNIT/ML ~~LOC~~ SOLN: 0.0000 [IU] | Freq: Three times a day (TID) | SUBCUTANEOUS | Status: DC

## 2013-09-07 MED ORDER — WARFARIN SODIUM 2 MG PO TABS
2.0000 mg | ORAL_TABLET | ORAL | Status: AC
  Administered 2013-09-07: 2 mg via ORAL
  Filled 2013-09-07: qty 1

## 2013-09-07 MED ORDER — CALCITONIN (SALMON) 200 UNIT/ACT NA SOLN: 1.0000 | Freq: Every day | NASAL | Status: DC

## 2013-09-07 MED ORDER — HEPARIN SOD (PORK) LOCK FLUSH 100 UNIT/ML IV SOLN
500.0000 [IU] | INTRAVENOUS | Status: AC | PRN
  Administered 2013-09-07: 500 [IU]

## 2013-09-07 MED ORDER — SODIUM CHLORIDE 0.9 % IV SOLN
250.0000 mg | Freq: Four times a day (QID) | INTRAVENOUS | Status: DC
  Filled 2013-09-07 (×2): qty 250

## 2013-09-07 MED ORDER — ALLOPURINOL 100 MG PO TABS: 200.0000 mg | ORAL_TABLET | Freq: Every day | ORAL | Status: DC

## 2013-09-07 MED ORDER — SODIUM CHLORIDE 0.9 % IV SOLN: 400.0000 mg | INTRAVENOUS | Status: DC

## 2013-09-07 MED ORDER — POLYETHYLENE GLYCOL 3350 17 G PO PACK: 17.0000 g | PACK | Freq: Every day | ORAL | Status: DC | PRN

## 2013-09-07 MED ORDER — WARFARIN SODIUM 2 MG PO TABS
2.0000 mg | ORAL_TABLET | Freq: Once | ORAL | Status: DC
  Filled 2013-09-07: qty 1

## 2013-09-07 MED ORDER — WARFARIN SODIUM 2 MG PO TABS: 2.0000 mg | ORAL_TABLET | Freq: Every day | ORAL | Status: DC

## 2013-09-07 NOTE — Progress Notes (Signed)
PHARMACY BRIEF NOTE:  ANTICOAGULATION  If patient is discharged today, recommend warfarin maintenance dosage of 2mg  daily with close INR f/u - next lab no later than Monday 09/11/13  Clayburn Pert, PharmD, BCPS Pager: [phone] 09/07/2013  1:22 PM

## 2013-09-07 NOTE — Progress Notes (Signed)
Patient has a bed at [facility] - patient & niece, Tamela Oddi aware. Anticipating discharge today.   Raynaldo Opitz, [facility] Clinical Social Worker cell #: [phone]

## 2013-09-07 NOTE — Telephone Encounter (Signed)
lmonvm advising the pt of her  1 week f/u appt on 09/15/2013@8 :30am

## 2013-09-07 NOTE — Discharge Summary (Addendum)
Physician Discharge Summary  [name] [id number] DOB: Nov 29, 1940 DOA: 08/30/2013  PCP: [name], MD  Admit date: 08/30/2013 Discharge date: 09/07/2013  Time spent: Greater than 30 minutes  Recommendations for Outpatient Follow-up:  1. M.D. at SNF in 3-4 days with repeat labs. 2. Recommend repeating labs (CBC, CMP, PT, INR, uric acid and magnesium) on 09/11/13. Adjust Coumadin dose as deemed appropriate. 3. Dr. Richarda Osmond, nephrology on 10/02/13 at 8:30 AM 4. Dr. Concha Norway, medical oncology-call for appointment to be seen in one week. 5. IV Cubicin every other day for additional one week. As per clinical social worker, SNF will be able to do this via [facility] Cath or peripheral IV access.  Discharge Diagnoses:  Active Problems:   Cardiac pacemaker in situ   Diffuse large B cell lymphoma   Hypothyroidism   Diabetes mellitus type 2 in obese   Hyperlipidemia   Antineoplastic chemotherapy induced pancytopenia   Acute renal failure   COPD (chronic obstructive pulmonary disease)   UTI (lower urinary tract infection)   Severe sepsis   Sepsis secondary to UTI   Generalized weakness   Acute on chronic renal failure   CKD (chronic kidney disease) stage 3, GFR 30-59 ml/min   Acute encephalopathy   Neutropenia   Discharge Condition: Improved & Stable  Diet recommendation: Heart healthy and diabetic diet.  Filed Weights   09/05/13 0412 09/06/13 0609 09/07/13 0512  Weight: 93.3 kg (205 lb 11 oz) 93.3 kg (205 lb 11 oz) 90.8 kg (200 lb 2.8 oz)    History of present illness:  [name] is a 73 y.o. female has a past medical history significant for sick sinus sd s/p pacemaker, A fib, DBCL currently undergoing chemotherapy followed by Dr. [name], presents to the ED with a chief complaint of weakness and dysuria.   Patient was admitted with severe sepsis with acute renal failure with Cr < 7, acute encephalopathy, neutropenic. She was initially admitted in SDU  and was started on broad spectrum Abx with Vancomycin and Primaxin. Nephrology has been consulted for her renal failure. With IVF, Antibiotics and Neulasta her clinical condition improved and was transferred to floor on 5/23. His urinalysis grew Enterococcus sensitive to vancomycin and his Primaxin was discontinued. Patient with increasing cough over the past 2-3 days, CXR on 5/25 with concerns for PNA and Primaxin restarted.   Hospital Course:   A fib with RVR  - Cardizem initially had to be held secondary to hypotension and hence caused RVR.  - Cardizem resumed with improved control. Currently with sinus rhythm/a paced and occasional PAF with RVR.  - Coumadin per pharmacy.   COPD  - no wheezing or evidence of decompensation. Continue home medications   Enterococcus UTI  - continue Cubicin, treat as complicated, will need [id number] days  - Patient has had a recent urinary intervention (right ureteral stent 4/3)  - discussed with infectious disease M.D. today and plan to treat with IV Cubicin every other day for additional one week. As per clinical social worker, SNF will be able to do this via [facility] Cath or peripheral IV access. - Post antibiotics, if patient again presents with sepsis-like picture, dysuria or features suggesting UTI, may need to consider possibility of infected right renal stent.  Severe sepsis with evidence of renal failure and lactic acidosis  - Secondary to UTI +/- pneumonia  - blood pressure improved, stable on the floor  - changed to Cubicin to avoid further renal damage, appreciate nephrology followup.  -  blood cultures negative,  negative  - enterococcus in the urine, sensitive to Vancomycin.  - patient completed almost one-week course of IV Primaxin. Chest x-ray repeated on 5/27 does not show any acute abnormality. Completed  treatment for possible pneumonia.   Relapsed, refractory high-grade non-Hodgkin's B-cell lymphoma on salvage therapy  - followed by Dr. [name] and  he recommends holding further chemotherapy at this time.  outpatient followup.   Acute renal failure on CKD stage III-IV  - multifactorial, right-sided hydronephrosis (status post right ureteral stent 4/03), nephrotoxic agents, urosepsis, hypercalcemia and volume depletion related to diarrhea.  - slowly improving, Cr. Baseline 1.6-1.9  - Nephrology continued to follow inpatient. They have seen her  5/27 and recommend monitor serum creatinine as outpatient weekly and have arranged for followup in their office on 10/02/13 at 8:30 AM.   Hyperkalemia  - due to renal failure. Resolved after Kayexalate.   Hypercalcemia  - Corrected serum calcium is 11.6. Expected to improve with improving renal functions. Continue oral steroids. No bisphosphonate secondary to recent acute renal failure. Will add calcitonin nasal spray. Followup CMP in a couple of days.  DM  - stop metformin, SSI. A1C 5.8   Neutropenia  - chemotherapy induced. Monitor. Neupogen per oncology, discontinued on 5/25 after 2 days with ANC > 1500.   Anemia  - multifactorial, of chronic disease, on Revlemid. No evidence of bleeding, monitor.   Supratherapeutc INR  - Coumadin per pharmacy, now within expected levels.   Acute encephalopathy  - in the setting of above acute illnesses. Resolved   SSS  - s/p PCM    Consultations:   Nephrology   Oncology  Procedures:   None    Discharge Exam:  Complaints:   denies complaints. No chest pain or dyspnea.  Filed Vitals:   09/06/13 1338 09/06/13 2118 09/07/13 0512 09/07/13 1022  BP: 98/72 109/49 108/52 117/59  Pulse: 72 70 70   Temp: 97.3 F (36.3 C) 98.3 F (36.8 C) 98.4 F (36.9 C)   TempSrc: Oral Oral Oral   Resp: 18 18 18    Height:      Weight:   90.8 kg (200 lb 2.8 oz)   SpO2: 95% 96% 96%     General: NAD. Pleasant elderly female  lying supine in bed .  Cardiovascular: regular rate and rhythm,  no JVD, murmurs or pedal edema.  Respiratory: good air  movement, occasional basal crackles but otherwise clear to auscultation, no wheezing, ronchi or rales Abdomen: soft, not tender to palpation, positive bowel sounds  MSK: no peripheral edema  Neuro: non focal. Alert and oriented.   Discharge Instructions      Discharge Instructions   Call MD for:  difficulty breathing, headache or visual disturbances    Complete by:  As directed      Call MD for:  persistant nausea and vomiting    Complete by:  As directed      Call MD for:  severe uncontrolled pain    Complete by:  As directed      Call MD for:  temperature >100.4    Complete by:  As directed      Diet - low sodium heart healthy    Complete by:  As directed      Diet Carb Modified    Complete by:  As directed      Increase activity slowly    Complete by:  As directed  Medication List    STOP taking these medications       atorvastatin 10 MG tablet  Commonly known as:  LIPITOR     guaiFENesin 600 MG 12 hr tablet  Commonly known as:  MUCINEX     lenalidomide 15 MG capsule  Commonly known as:  REVLIMID     metFORMIN 500 MG 24 hr tablet  Commonly known as:  GLUCOPHAGE-XR     potassium chloride 10 MEQ tablet  Commonly known as:  K-DUR,KLOR-CON     sennosides-docusate sodium 8.6-50 MG tablet  Commonly known as:  SENOKOT-S     Vitamin D3 2000 UNITS Tabs      TAKE these medications       albuterol (2.5 MG/3ML) 0.083% nebulizer solution  Commonly known as:  PROVENTIL  Take 3 mLs (2.5 mg total) by nebulization every 6 (six) hours as needed for shortness of breath.     allopurinol 100 MG tablet  Commonly known as:  ZYLOPRIM  Take 2 tablets (200 mg total) by mouth daily.     calcitonin (salmon) 200 UNIT/ACT nasal spray  Commonly known as:  MIACALCIN  Place 1 spray into alternate nostrils daily.     citalopram 20 MG tablet  Commonly known as:  CELEXA  TAKE 1 TABLET BY MOUTH DAILY FOR MOOD     DAPTOmycin 400 mg in sodium chloride 0.9 % 100 mL   Inject 400 mg into the vein every other day. Doses on 5/30, 6/1 & 6/3, then discontinue  Start taking on:  09/09/2013     dexamethasone 2 MG tablet  Commonly known as:  DECADRON  Take 1 tablet (2 mg total) by mouth 2 (two) times daily with a meal.     diltiazem 180 MG 24 hr capsule  Commonly known as:  CARDIZEM CD  TAKE 1 CAPSULE BY MOUTH DAILY     insulin aspart 100 UNIT/ML injection  Commonly known as:  novoLOG  - Inject 0-9 Units into the skin 3 (three) times daily with meals. CBG < 70: implement hypoglycemia protocol  - CBG 70 - 120: 0 units  - CBG 121 - 150: 1 unit  - CBG 151 - 200: 2 units  - CBG 201 - 250: 3 units  - CBG 251 - 300: 5 units  - CBG 301 - 350: 7 units  - CBG 351 - 400: 9 units  - CBG > 400: call MD.     levothyroxine 50 MCG tablet  Commonly known as:  SYNTHROID, LEVOTHROID  Take 50 mcg by mouth daily before breakfast.     lidocaine-prilocaine cream  Commonly known as:  EMLA  Apply topically as needed. Apply to port a cath site one hour before needle stick as needed.     magnesium oxide 400 (241.3 MG) MG tablet  Commonly known as:  MAG-OX  Take 0.5 tablets (200 mg total) by mouth daily.     ondansetron 8 MG tablet  Commonly known as:  ZOFRAN  TAKE 1 TABLET BY MOUTH EVERY 12 HOURS AS NEEDED FOR NAUSEA     polyethylene glycol packet  Commonly known as:  MIRALAX / GLYCOLAX  Take 17 g by mouth daily as needed for moderate constipation.     pravastatin 40 MG tablet  Commonly known as:  PRAVACHOL  TAKE 1 TABLET BY MOUTH EVERY NIGHT AT BEDTIME FOR CHOLESTEROL     tiotropium 18 MCG inhalation capsule  Commonly known as:  SPIRIVA  Place 1 capsule (18 mcg total)  into inhaler and inhale daily.     URELLE 81 MG Tabs tablet  Take 1 tablet (81 mg total) by mouth 3 (three) times daily as needed for bladder spasms.     warfarin 2 MG tablet  Commonly known as:  COUMADIN  Take 1 tablet (2 mg total) by mouth daily at 6 PM.  Start taking on:   09/08/2013       Follow-up Information   Follow up with Festus Aloe, MD In 2 weeks. (to plan right stent change)    Specialty:  Urology   Contact information:   [address] [id number] [phone]        The results of significant diagnostics from this hospitalization (including imaging, microbiology, ancillary and laboratory) are listed below for reference.    Significant Diagnostic Studies: Dg Chest 2 View  09/07/2013   CLINICAL DATA:  Cough  EXAM: CHEST  2 VIEW  COMPARISON:  09/04/2013  FINDINGS: A right-sided chest wall port and left-sided pacemaker are again seen and stable. The cardiac shadow is stable. The lungs are well aerated bilaterally without focal infiltrate or sizable effusion. No acute bony abnormality is seen.  IMPRESSION: No active cardiopulmonary disease.   Electronically Signed   By: Inez Catalina M.D.   On: 09/07/2013 08:41   US Renal  08/30/2013   CLINICAL DATA:  Flank pain.  EXAM: RENAL/URINARY TRACT ULTRASOUND COMPLETE  COMPARISON:  CT 06/05/2013.  FINDINGS: Right Kidney:  Length: 9.7 cm. Ureteral stent noted. Echogenicity within normal limits. No mass or hydronephrosis visualized.  Left Kidney:  Length: 9.9 cm. Echogenicity within normal limits. No mass or hydronephrosis visualized.  Bladder:  Bladder is nondistended. Bladder wall is thickened to approximately 9 mm.  IMPRESSION: 1. Right ureteral stent noted.  No hydronephrosis. 2. Bladder nondistended.  Bladder wall thickening is present.   Electronically Signed   By: [place]   On: 08/30/2013 15:03   Dg Chest Port 1 View  09/04/2013   CLINICAL DATA:  Cough.  EXAM: PORTABLE CHEST - 1 VIEW  COMPARISON:  May 20th 2015.  FINDINGS: Stable mild cardiomegaly. Left-sided pacemaker in right subclavian Port-A-Cath are unchanged in position. Left lung is clear. Increased right lower lobe opacity is noted concerning for worsening subsegmental atelectasis or possibly pneumonia. Bony thorax appears  intact. No pneumothorax or significant pleural effusion is noted.  IMPRESSION: Increased right lower lobe opacity is noted concerning for subsegmental atelectasis or pneumonia.   Electronically Signed   By: Sabino Dick M.D.   On: 09/04/2013 09:43   Dg Chest Port 1 View  08/30/2013   CLINICAL DATA:  Nausea, emesis, diarrhea.  History of COPD.  EXAM: PORTABLE CHEST - 1 VIEW  COMPARISON:  Chest radiograph 06/26/2013 and CT chest abdomen pelvis 06/05/2013  FINDINGS: Right subclavian power port with the distal tip projecting over the superior vena cava. Left chest wall dual lead pacemaker appears stable. Cardiac leads project over the chest. Cardiomegaly appears similar to recent prior chest radiograph. Pulmonary vascularity is upper normal. The lungs appear clear. No airspace disease, effusion, or pneumothorax identified.  IMPRESSION: Mild cardiomegaly. Pulmonary vascularity is upper normal and the lungs appear clear.   Electronically Signed   By: Curlene Dolphin M.D.   On: 08/30/2013 14:43    Microbiology: Recent Results (from the past 240 hour(s))  CULTURE, BLOOD (ROUTINE X 2)     Status: None   Collection Time    08/30/13 12:35 PM  Result Value Ref Range Status   Specimen Description BLOOD RIGHT PORTA CATH   Final   Special Requests BOTTLES DRAWN AEROBIC AND ANAEROBIC 5 CC EA   Final   Culture  Setup Time     Final   Value: 08/30/2013 17:07     Performed at [facility]   Culture     Final   Value: NO GROWTH 5 DAYS     Performed at [facility]   Report Status 09/05/2013 FINAL   Final  CULTURE, BLOOD (ROUTINE X 2)     Status: None   Collection Time    08/30/13  1:24 PM      Result Value Ref Range Status   Specimen Description BLOOD LEFT ANTECUBITAL   Final   Special Requests BOTTLES DRAWN AEROBIC AND ANAEROBIC 5CC   Final   Culture  Setup Time     Final   Value: 08/30/2013 17:07     Performed at [facility]   Culture     Final   Value: NO GROWTH 5 DAYS      Performed at [facility]   Report Status 09/05/2013 FINAL   Final  URINE CULTURE     Status: None   Collection Time    08/30/13  1:25 PM      Result Value Ref Range Status   Specimen Description URINE, CATHETERIZED   Final   Special Requests NONE   Final   Culture  Setup Time     Final   Value: 08/31/2013 01:47     Performed at [place]     Final   Value: >=100,000 COLONIES/ML     Performed at [facility]   Culture     Final   Value: ENTEROCOCCUS SPECIES     Performed at [facility]   Report Status 09/01/2013 FINAL   Final   Organism ID, Bacteria ENTEROCOCCUS SPECIES   Final  MRSA PCR SCREENING     Status: None   Collection Time    08/30/13  5:08 PM      Result Value Ref Range Status   MRSA by PCR NEGATIVE  NEGATIVE Final   Comment:            The GeneXpert MRSA Assay (FDA     approved for NASAL specimens     only), is one component of a     comprehensive MRSA colonization     surveillance program. It is not     intended to diagnose MRSA     infection nor to guide or     monitor treatment for     MRSA infections.     Labs: Basic Metabolic Panel:  Recent Labs Lab 09/03/13 0347 09/04/13 0435 09/05/13 0330 09/06/13 0550 09/07/13 0412  NA 138 137 138 138 137  K 3.4* 3.5* 3.7 3.8 3.7  CL 105 102 101 99 98  CO2 16* 19 22 23 24   GLUCOSE 138* 151* 180* 177* 188*  BUN 69* 67* 67* 71* 68*  CREATININE 4.42* 3.61* 2.96* 2.91* 2.58*  CALCIUM 8.9 9.6 9.8 10.4 10.6*   Liver Function Tests:  Recent Labs Lab 09/06/13 0550  AST 13  ALT 16  ALKPHOS 87  BILITOT 0.2*  PROT 5.2*  ALBUMIN 2.8*   No results found for this basename: LIPASE, AMYLASE,  in the last 168 hours No results found for this basename: AMMONIA,  in the last 168 hours CBC:  Recent Labs Lab 09/02/13 0500 09/03/13 0347 09/04/13 0435 09/05/13 0330 09/06/13 0550  WBC 1.2* 3.2* 10.5 14.3* 12.8*  NEUTROABS  --  2.1 7.8* 11.2*  --   HGB 7.8*  8.5* 9.8* 9.6* 9.6*  HCT 23.7* 25.4* 28.9* 28.8* 28.1*  MCV 84.6 83.3 82.6 82.8 82.6  PLT 139* 151 182 166 170   Cardiac Enzymes:  Recent Labs Lab 09/04/13 0435 09/04/13 0500  CKTOTAL 27  --   TROPONINI  --  <0.30   BNP: BNP (last 3 results) No results found for this basename: PROBNP,  in the last 8760 hours CBG:  Recent Labs Lab 09/06/13 1138 09/06/13 1630 09/06/13 2117 09/07/13 0812 09/07/13 1141  GLUCAP 192* 184* 269* 155* 186*    Additional labs: 1. INR: 2.38 2. A1C; 5.8   Signed:  Modena Jansky, MD, FACP, [facility]. [facility] Pager [phone]  If 7PM-7AM, please contact night-coverage [email or web address] Password [email or web address] 09/07/2013, 1:42 PM

## 2013-09-07 NOTE — Progress Notes (Signed)
Patient is set to discharge to [facility] today. Patient & niece, Tamela Oddi made aware. [employer] authorization obtained. Discharge packet given to RN, Ify. [facility] called for transport.   Clinical Social Work Department CLINICAL SOCIAL WORK PLACEMENT NOTE 09/07/2013  Patient:  KARN, DERK  Account Number:  [id number] Admit date:  08/30/2013  Clinical Social Worker:  Renold Genta  Date/time:  09/04/2013 11:57 AM  Clinical Social Work is seeking post-discharge placement for this patient at the following level of care:   SKILLED NURSING   (*CSW will update this form in Epic as items are completed)   09/04/2013  Patient/family provided with [place] Department of Clinical Social Work's list of facilities offering this level of care within the geographic area requested by the patient (or if unable, by the patient's family).  09/04/2013  Patient/family informed of their freedom to choose among providers that offer the needed level of care, that participate in Medicare, Medicaid or managed care program needed by the patient, have an available bed and are willing to accept the patient.  09/04/2013  Patient/family informed of [facility]' ownership interest in [facility], as well as of the fact that they are under no obligation to receive care at this facility.  PASARR submitted to EDS on 09/04/2013 PASARR number received from EDS on 09/04/2013  FL2 transmitted to all facilities in geographic area requested by pt/family on  09/04/2013 FL2 transmitted to all facilities within larger geographic area on   Patient informed that his/her managed care company has contracts with or will negotiate with  certain facilities, including the following:   [facility]     Patient/family informed of bed offers received:  09/04/2013 Patient chooses bed at [place] Physician recommends and patient chooses bed at    Patient to be transferred to  [facility] on  09/07/2013 Patient to be transferred to facility by [facility]  The following physician request were entered in Epic:   Additional Comments:   Raynaldo Opitz, [place] Worker cell #: [phone]

## 2013-09-07 NOTE — Progress Notes (Signed)
[facility] for Warfarin Indication: atrial fibrillation  Allergies  Allergen Reactions  . Codeine Nausea Only  . Penicillins     Unknown    Patient Measurements: Height: 4\' 11"  (149.9 cm) Weight: 200 lb 2.8 oz (90.8 kg) IBW/kg (Calculated) : 43.2  Vital Signs: Temp: 98.4 F (36.9 C) (05/28 0512) Temp src: Oral (05/28 0512) BP: 117/59 mmHg (05/28 1022) Pulse Rate: 70 (05/28 0512)  Labs:  Recent Labs  09/05/13 0330 09/06/13 0550 09/07/13 0412  HGB 9.6* 9.6*  --   HCT 28.8* 28.1*  --   PLT 166 170  --   LABPROT 30.2* 27.5* 25.2*  INR 3.02* 2.67* 2.38*  CREATININE 2.96* 2.91* 2.58*    Estimated Creatinine Clearance: 19.4 ml/min (by C-G formula based on Cr of 2.58).   Assessment: 65 yoF admitted 5/20 with sepsis on chronic warfarin prior to admission for history of atrial fibrillation.  PMH also includes: UTI, recurrent diffuse B cell lymphoma, CKD, COPD, DM, neutropenia, anemia.  Pharmacy is consulted to continue warfarin dosing inpatient.  Supratherapeutic INR of 4.4 on admission.  PTA Warfarin 3 mg daily except 6 mg on Saturdays (per 08/18/13 anticoag clinic visit, recent dose reduction), last dose PTA on 5/19  INR 2.38 today, decreased into therapeutic range after providing lower doses for past two days  SCr improving daily, Renal following  No significant drug interactions noted.   Goal of Therapy:  INR 2-3    Plan:   Warfarin 2 mg po x1 again today.  Providing lower dose than home regimen since patient has appeared more sensitive to warfarin during this admission thus far.  Daily PT/INR  Clayburn Pert, PharmD, BCPS Pager: [phone] 09/07/2013  12:13 PM

## 2013-09-07 NOTE — Progress Notes (Signed)
Patient discharged to [facility] via ambulance. Patient discharge packet prepared by CSW and given to EMS transporter for facility. No wound, skin intact. Discharged patient with port A cath to SNF to continue IV antibiotics, Report given to Ronie Spies at the facility.

## 2013-09-08 NOTE — Progress Notes (Signed)
Patient ID: [name], female   DOB: 1940-10-18, 73 y.o.   MRN: [id number]        Berniece Andreas W

## 2013-09-11 ENCOUNTER — Other Ambulatory Visit: Payer: Self-pay | Admitting: Internal Medicine

## 2013-09-11 ENCOUNTER — Telehealth: Payer: Self-pay | Admitting: Internal Medicine

## 2013-09-11 DIAGNOSIS — C8330 Diffuse large B-cell lymphoma, unspecified site: Secondary | ICD-10-CM

## 2013-09-11 NOTE — Telephone Encounter (Signed)
ADDED LAB APPT FOR 6/5. LAB ADDED FOR SAME TIME AS F/U DUE TO LAB MEETING. COMMENT ADDED TO APPT TO SENT PT TO LAB BEFORE F/U APPT. LMONVM FOR PT RE ADD ON LAB AND ASKED THAT PT ARRIVE @ 8AM.

## 2013-09-13 ENCOUNTER — Other Ambulatory Visit: Payer: Self-pay | Admitting: Urology

## 2013-09-14 ENCOUNTER — Ambulatory Visit: Payer: Medicare Other

## 2013-09-15 ENCOUNTER — Ambulatory Visit (HOSPITAL_BASED_OUTPATIENT_CLINIC_OR_DEPARTMENT_OTHER): Payer: Medicare Other | Admitting: Internal Medicine

## 2013-09-15 ENCOUNTER — Other Ambulatory Visit: Payer: Self-pay | Admitting: Internal Medicine

## 2013-09-15 ENCOUNTER — Other Ambulatory Visit (HOSPITAL_BASED_OUTPATIENT_CLINIC_OR_DEPARTMENT_OTHER): Payer: Medicare Other

## 2013-09-15 ENCOUNTER — Ambulatory Visit (HOSPITAL_BASED_OUTPATIENT_CLINIC_OR_DEPARTMENT_OTHER): Payer: Medicare Other

## 2013-09-15 VITALS — BP 97/57 | HR 69 | Temp 97.7°F | Resp 18 | Ht 59.0 in | Wt 203.2 lb

## 2013-09-15 VITALS — BP 119/57 | HR 69 | Temp 97.8°F | Resp 20

## 2013-09-15 DIAGNOSIS — C8330 Diffuse large B-cell lymphoma, unspecified site: Secondary | ICD-10-CM

## 2013-09-15 DIAGNOSIS — N039 Chronic nephritic syndrome with unspecified morphologic changes: Secondary | ICD-10-CM

## 2013-09-15 DIAGNOSIS — E860 Dehydration: Secondary | ICD-10-CM

## 2013-09-15 DIAGNOSIS — D631 Anemia in chronic kidney disease: Secondary | ICD-10-CM

## 2013-09-15 DIAGNOSIS — C8589 Other specified types of non-Hodgkin lymphoma, extranodal and solid organ sites: Secondary | ICD-10-CM

## 2013-09-15 DIAGNOSIS — Z5112 Encounter for antineoplastic immunotherapy: Secondary | ICD-10-CM

## 2013-09-15 DIAGNOSIS — C8583 Other specified types of non-Hodgkin lymphoma, intra-abdominal lymph nodes: Secondary | ICD-10-CM

## 2013-09-15 DIAGNOSIS — E875 Hyperkalemia: Secondary | ICD-10-CM

## 2013-09-15 DIAGNOSIS — I4891 Unspecified atrial fibrillation: Secondary | ICD-10-CM

## 2013-09-15 DIAGNOSIS — N189 Chronic kidney disease, unspecified: Secondary | ICD-10-CM

## 2013-09-15 DIAGNOSIS — C8593 Non-Hodgkin lymphoma, unspecified, intra-abdominal lymph nodes: Secondary | ICD-10-CM

## 2013-09-15 DIAGNOSIS — C8590 Non-Hodgkin lymphoma, unspecified, unspecified site: Secondary | ICD-10-CM

## 2013-09-15 LAB — CBC WITH DIFFERENTIAL/PLATELET
BASO%: 0.5 % (ref 0.0–2.0)
BASOS ABS: 0.1 10*3/uL (ref 0.0–0.1)
EOS ABS: 0 10*3/uL (ref 0.0–0.5)
EOS%: 0 % (ref 0.0–7.0)
HEMATOCRIT: 33.6 % — AB (ref 34.8–46.6)
HEMOGLOBIN: 10.6 g/dL — AB (ref 11.6–15.9)
LYMPH%: 3.2 % — AB (ref 14.0–49.7)
MCH: 27.7 pg (ref 25.1–34.0)
MCHC: 31.5 g/dL (ref 31.5–36.0)
MCV: 88.1 fL (ref 79.5–101.0)
MONO#: 0.5 10*3/uL (ref 0.1–0.9)
MONO%: 2.8 % (ref 0.0–14.0)
NEUT%: 93.5 % — ABNORMAL HIGH (ref 38.4–76.8)
NEUTROS ABS: 15.8 10*3/uL — AB (ref 1.5–6.5)
PLATELETS: 243 10*3/uL (ref 145–400)
RBC: 3.81 10*6/uL (ref 3.70–5.45)
RDW: 19 % — ABNORMAL HIGH (ref 11.2–14.5)
WBC: 16.9 10*3/uL — ABNORMAL HIGH (ref 3.9–10.3)
lymph#: 0.5 10*3/uL — ABNORMAL LOW (ref 0.9–3.3)

## 2013-09-15 LAB — PROTIME-INR
INR: 2.3 (ref 2.00–3.50)
Protime: 27.6 Seconds — ABNORMAL HIGH (ref 10.6–13.4)

## 2013-09-15 LAB — COMPREHENSIVE METABOLIC PANEL (CC13)
ALBUMIN: 3.2 g/dL — AB (ref 3.5–5.0)
ALK PHOS: 89 U/L (ref 40–150)
ALT: 35 U/L (ref 0–55)
AST: 14 U/L (ref 5–34)
Anion Gap: 15 mEq/L — ABNORMAL HIGH (ref 3–11)
BUN: 64.9 mg/dL — AB (ref 7.0–26.0)
CO2: 21 mEq/L — ABNORMAL LOW (ref 22–29)
Calcium: 9.6 mg/dL (ref 8.4–10.4)
Chloride: 96 mEq/L — ABNORMAL LOW (ref 98–109)
Creatinine: 2.2 mg/dL — ABNORMAL HIGH (ref 0.6–1.1)
GLUCOSE: 334 mg/dL — AB (ref 70–140)
Potassium: 5.2 mEq/L — ABNORMAL HIGH (ref 3.5–5.1)
Sodium: 133 mEq/L — ABNORMAL LOW (ref 136–145)
Total Bilirubin: 0.33 mg/dL (ref 0.20–1.20)
Total Protein: 5.9 g/dL — ABNORMAL LOW (ref 6.4–8.3)

## 2013-09-15 LAB — LACTATE DEHYDROGENASE (CC13): LDH: 340 U/L — ABNORMAL HIGH (ref 125–245)

## 2013-09-15 MED ORDER — SODIUM CHLORIDE 0.9 % IJ SOLN
10.0000 mL | INTRAMUSCULAR | Status: DC | PRN
  Administered 2013-09-15: 10 mL via INTRAVENOUS
  Filled 2013-09-15: qty 10

## 2013-09-15 MED ORDER — DIPHENHYDRAMINE HCL 25 MG PO CAPS: 50.0000 mg | ORAL_CAPSULE | Freq: Once | ORAL | Status: DC

## 2013-09-15 MED ORDER — SODIUM CHLORIDE 0.9 % IV SOLN
Freq: Once | INTRAVENOUS | Status: AC
  Administered 2013-09-15: 14:00:00 via INTRAVENOUS

## 2013-09-15 MED ORDER — HEPARIN SOD (PORK) LOCK FLUSH 100 UNIT/ML IV SOLN
500.0000 [IU] | Freq: Once | INTRAVENOUS | Status: AC
  Administered 2013-09-15: 500 [IU] via INTRAVENOUS
  Filled 2013-09-15: qty 5

## 2013-09-15 MED ORDER — SODIUM CHLORIDE 0.9 % IV SOLN
1000.0000 mL | Freq: Once | INTRAVENOUS | Status: AC
  Administered 2013-09-15: 1000 mL via INTRAVENOUS

## 2013-09-15 MED ORDER — DIPHENHYDRAMINE HCL 25 MG PO CAPS
ORAL_CAPSULE | ORAL | Status: AC
  Filled 2013-09-15: qty 2

## 2013-09-15 MED ORDER — HEPARIN SOD (PORK) LOCK FLUSH 100 UNIT/ML IV SOLN
500.0000 [IU] | Freq: Once | INTRAVENOUS | Status: AC | PRN
  Administered 2013-09-15: 500 [IU]
  Filled 2013-09-15: qty 5

## 2013-09-15 MED ORDER — ACETAMINOPHEN 325 MG PO TABS
ORAL_TABLET | ORAL | Status: AC
  Filled 2013-09-15: qty 2

## 2013-09-15 MED ORDER — ACETAMINOPHEN 325 MG PO TABS
650.0000 mg | ORAL_TABLET | Freq: Once | ORAL | Status: AC
  Administered 2013-09-15: 650 mg via ORAL

## 2013-09-15 MED ORDER — DIPHENHYDRAMINE HCL 25 MG PO CAPS
25.0000 mg | ORAL_CAPSULE | Freq: Once | ORAL | Status: AC
  Administered 2013-09-15: 25 mg via ORAL

## 2013-09-15 MED ORDER — SODIUM CHLORIDE 0.9 % IV SOLN
375.0000 mg/m2 | Freq: Once | INTRAVENOUS | Status: AC
  Administered 2013-09-15: 800 mg via INTRAVENOUS
  Filled 2013-09-15: qty 80

## 2013-09-15 MED ORDER — SODIUM CHLORIDE 0.9 % IJ SOLN
10.0000 mL | INTRAMUSCULAR | Status: DC | PRN
  Administered 2013-09-15: 10 mL
  Filled 2013-09-15: qty 10

## 2013-09-15 MED ORDER — SODIUM CHLORIDE 0.9 % IV SOLN
500.0000 mL | Freq: Once | INTRAVENOUS | Status: AC
  Administered 2013-09-15: 15:00:00 via INTRAVENOUS

## 2013-09-15 NOTE — Progress Notes (Signed)
Dana Lake, MD [address] [place] [address]  DIAGNOSIS: Diffuse large B cell lymphoma - Plan: CBC with Differential in 1 month, CBC with Differential, Comprehensive metabolic panel, Comprehensive metabolic panel, Lactate dehydrogenase, Lactate dehydrogenase  Chief Complaint  Patient presents with  . Follow-up    CURRENT TREATMENT:  Started Revlimid plus rituxan as a 4th line salvage therapy on 08/09/2013 (Witzig TE, et al, Ann Oncol. 2011 Jul;22 (7):1622-7.  She received rituxan on 08/09/2013.      Non-Hodgkin's lymphoma of abdomen   09/23/2010 Imaging CT abdomen and pelvis: 5.5 x 6 cm irregular right abdominal mass with adjacent small lymph nodes likely representing neoplasm.  This does not appear to connect to hollow or solid viscera and may represent a sarcoma.    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    11/03/2010 Initial Diagnosis Non-Hodgkin's lymphoma of abdomen   11/24/2010 Bone Marrow Biopsy No involvment of lymphoma identified.    11/25/2010 Cancer Staging PET/CT: 1.  Interval enlargement of a hypermetabolic ileocolic mesenteric nodal mass. 2.  Hypermetabolic focus corresponding to an enlarging precaval node.  This is consistent with active lymphoma. 3.  No evidence of extra-abdominal disease.   12/08/2010 Procedure R port-a-catheter placment   12/11/2010 - 03/27/2011 Chemotherapy Q 3 week R-CHOP x 6 cyles   04/27/2011 Cancer Staging PET/CT:  1.  Complete response to chemotherapy with no hypermetabolic activity associated with the mesenteric mass or prevascular lymph nodes which are also reduced in size.   05/28/2011 - 06/26/2011 Radiation Therapy Consolidative XRT [name]). Right abdomen and right pelvis/ 3060 cGy in 17 fractions   10/12/2011 Imaging CT C/A/P: 1.  Interval  increase in size of aortocaval lymph node measuring 12 mm compared to 6 mm on prior.  This is concerning for mild disease progression. 2.  Mesenteric mass in the ileocecal mesentery is decreased slightly in size.3.  Spleen is normal.   01/18/2012 Imaging CT C/A/P: 1.  Slight interval decrease in size of the mesenteric mass. 2.  Enlarging retroperitoneal lymph node. 3.  No new lymphadenopathy.   05/23/2012 Imaging CT of abdomen: 1.  Significant interval enlargement of a retroperitoneal LN  adjacent to the IVC and infrarenal abdominal aorta, which currently measures 3.9 x 2.9 cm.  Findings are compatible with progression of disease.  No new lymphadenopathy.    05/23/2012 Relapse/Recurrence She had relapsed disease.   06/29/2012 - 08/25/2012 Chemotherapy Completed 3 cycles of bendamustine/Rituxan on 06/29/2012.    08/25/2012 Progression Had disease progression while on Bendamustine/Rituxan   10/03/2012 Procedure Biopsy of R. Perotoneal mass   10/03/2012 Pathology Pathology consistent with high grade NH B-cell lymphoma   10/26/2012 - 03/14/2013 Chemotherapy She had salvage chemotherapy with R-GPD q 3 weeks.  Completed 5 cyles.    02/11/2013 - 02/24/2013 Hospital Admission Admitted secondary to febrile neutropenia.  Her infectious work-up was negative and her thrombocytopenia was prolonged.  She received antibiotics and plts transfusion.    03/27/2013 Imaging CT C/A/P on 12/15 was consistent with mild disease progression.    06/05/2013 Progression CT C/A/P consistent with moderate disease progression plus R hydronephosis (moderate,severe).  Planning R-ICE salvage chemotherapy followed by transplant [name], Dr. Cassell Clement). Referral to Urology   06/26/2013 - 07/03/2013 Hospital Admission Hospitalized with AMS, fevers and dysuria.  Treated for urosepsis with broad spectrum antibiotics.  Perc  nephrostomy placed on right kidney due to hydronephrosis.  Discharged to rehab.    07/14/2013 Procedure Had Perc nephrostomy removed with  placement of right ureteral stent.     08/02/2013 -  Chemotherapy Starting rituximab (first dose on 08/09/2013) and lenalidomide 15 mg every other day. (Witzig, TE et. al, Ann Oncol. 2011 Jul;22(7): 1622-7.     INTERVAL HISTORY: [name] 73 y.o. female returns for follow up visit. She was last seen by me on 08/30/2013. She was having altered mental status and appeared dehydrated and acute kidney injury (creatin of 7.6) and was referred to [facility] ER for admission. She had eight day admission with discharge on 09/07/2013 to [facility] and rehab. Her creatinine was 2.58 on day of discharge.  She was to take IV cubicin every other day for one week and had follow up with nephrology on 06/22.     Today, she is accompanied by her niece [name].   She was last seen by Dr. Loanne Drilling last nearly one month ago for her hypercalcemia and recommended low prednisone.  She feels much improved with the exception of her chronic cough.  She denies dysuria.     MEDICAL HISTORY: Past Medical History  Diagnosis Date  . Sick sinus syndrome   . Hematoma     At the site of the pacemaker insertion.  . Hypotension   . Dyslipidemia   . History of atrial fibrillation   . Arthritis     Osteoarthritis  . Port-a-cath in place 12/08/10  . Status post chemotherapy completed 03/2011    R - CHOP q 3 weeks x 6  . S/P radiation therapy 05/28/2011 - 06/26/2011    Right Abdomen and Right Pelvis/3060 cGy in 17 Fractions  . Hypertension   . ASHD (arteriosclerotic heart disease)   . Gout   . COPD (chronic obstructive pulmonary disease)   . Asthma   . Hyperlipidemia   . Diabetes mellitus   . Hypothyroidism   . Diffuse large B cell lymphoma     Dr. Chisom,oncology  . Cancer 10/2010    large B cell lymphoma  . 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 Pacemaker     left chest  . Dysrhythmia     Hx. A. Fib, sick sinus syndrome- Pacemaker inserted.  . Cataracts, bilateral      INTERIM HISTORY: has Hypertension; Cardiac pacemaker in situ; Diffuse large B cell lymphoma; Hypothyroidism; T2 NIDDM; Hyperlipidemia; Non-Hodgkin's lymphoma of abdomen; Depression; Fever and neutropenia; Antineoplastic chemotherapy induced pancytopenia; Acute renal failure; Anemia of chronic disease; Chronic bronchitis; ASHD (arteriosclerotic heart disease); Gout; COPD (chronic obstructive pulmonary disease); Asthma; Atrial fibrillation with RVR; Hydronephrosis, right; UTI (lower urinary tract infection); Severe sepsis; Sepsis secondary to UTI; Anticoagulation management encounter; Generalized weakness; Hypercalcemia; Acute on chronic renal failure; CKD (chronic kidney disease) stage 3, GFR 30-59 ml/min; Acute encephalopathy; and Neutropenia on her problem list.    ALLERGIES:  is allergic to codeine and penicillins.  MEDICATIONS: has a current medication list which includes the following prescription(s): albuterol, allopurinol, bisacodyl, calcitonin (salmon), citalopram, DAPTOmycin 400 mg in sodium chloride 0.9 % 100 mL, dexamethasone, diltiazem, insulin aspart, levothyroxine, lidocaine-prilocaine, magnesium oxide, ondansetron, polyethylene glycol, pravastatin, tiotropium, urelle, and warfarin, and the following Facility-Administered Medications: sodium chloride, sodium chloride, acetaminophen, diphenhydramine, heparin lock flush, riTUXimab (RITUXAN) 800 mg in sodium chloride 0.9 % 250 mL chemo infusion, sodium chloride, and sodium chloride.  SURGICAL HISTORY:  Past Surgical History  Procedure Laterality Date  .  Pacemaker insertion      Lead revision completed August 04, 2006- left chest G. Lovena Le  . Tubal ligation      Bilateral  . Insert / replace / remove pacemaker    . Abdominal hysterectomy    . Biopsy stomach  11/03/10    Soft Tissue Mass, Biopsy, Right Lower Quadrant Mesenteric Mass- High Grade Non-Hodgkins B Cell Lymphoma  with Flow Cytometry  . Bone biopsy  11/24/10    Bone Marrow,  Aspirate Biospy, and clot, Left - No Involvement of Non-Hodgkin's Lymphoma Identified  . Portacath placement Right 06-23-13  . Cholecystectomy      laparoscopic  . Bladder suspension      REVIEW OF SYSTEMS:   Constitutional: Denies fevers, chills or abnormal weight loss Eyes: Denies blurriness of vision Ears, nose, mouth, throat, and face: Denies mucositis or sore throat Respiratory: Reports a chronic non-productive cough, but denies dyspnea or wheezes Cardiovascular: Denies palpitation, chest discomfort or lower extremity swelling Gastrointestinal:  Denies nausea, heartburn or change in bowel habits Skin: Denies abnormal skin rashes Lymphatics: Denies new lymphadenopathy or easy bruising Neurological:Denies numbness, tingling or new weaknesses Behavioral/Psych: Mood is stable, no new changes  All other systems were reviewed with the patient and are negative.  PHYSICAL EXAMINATION: ECOG PERFORMANCE STATUS: 1 - Symptomatic but completely ambulatory  Blood pressure 97/57, pulse 69, temperature 97.7 F (36.5 C), temperature source Oral, resp. rate 18, height 4' 11"  (1.499 m), weight 203 lb 3.2 oz (92.171 kg), SpO2 99.00%.  GENERAL:alert, no distress and comfortable; Obese lady seating in her wheelchair, chronically ill appearing. SKIN: skin color, texture, turgor are normal, no rashes or significant lesions; Port a cath on R.  EYES: normal, Conjunctiva are pink and non-injected, sclera clear OROPHARYNX:no exudate, no erythema and lips, buccal mucosa, and tongue normal  NECK: supple, thyroid normal size, non-tender, without nodularity LYMPH:  no palpable lymphadenopathy in the cervical, axillary or supraclavicular LUNGS: clear to auscultation with normal breathing effort, no wheezes or rhonchi HEART: regular rate & rhythm and no murmurs and no lower extremity edema ABDOMEN:abdomen soft, non-tender and normal bowel sounds Musculoskeletal:no cyanosis of digits and no clubbing  NEURO:  alert & oriented x 3 with fluent speech, no focal motor/sensory deficits  Labs:  Lab Results  Component Value Date   WBC 16.9* 09/15/2013   HGB 10.6* 09/15/2013   HCT 33.6* 09/15/2013   MCV 88.1 09/15/2013   PLT 243 09/15/2013   NEUTROABS 15.8* 09/15/2013      Chemistry      Component Value Date/Time   NA 133* 09/15/2013 0840   NA 137 09/07/2013 0412   NA 141 10/12/2011 0957   K 5.2* 09/15/2013 0840   K 3.7 09/07/2013 0412   K 4.4 10/12/2011 0957   CL 98 09/07/2013 0412   CL 102 09/21/2012 0917   CL 95* 10/12/2011 0957   CO2 21* 09/15/2013 0840   CO2 24 09/07/2013 0412   CO2 29 10/12/2011 0957   BUN 64.9* 09/15/2013 0840   BUN 68* 09/07/2013 0412   BUN 15 10/12/2011 0957   CREATININE 2.2* 09/15/2013 0840   CREATININE 2.58* 09/07/2013 0412   CREATININE 1.10 04/19/2013 1420      Component Value Date/Time   CALCIUM 9.6 09/15/2013 0840   CALCIUM 10.6* 09/07/2013 0412   CALCIUM 9.1 10/12/2011 0957   ALKPHOS 89 09/15/2013 0840   ALKPHOS 87 09/06/2013 0550   ALKPHOS 65 10/12/2011 0957   AST 14 09/15/2013 0840   AST 13  09/06/2013 0550   AST 19 10/12/2011 0957   ALT 35 09/15/2013 0840   ALT 16 09/06/2013 0550   ALT 20 10/12/2011 0957   BILITOT 0.33 09/15/2013 0840   BILITOT 0.2* 09/06/2013 0550   BILITOT 0.60 10/12/2011 0957       Basic Metabolic Panel:  Recent Labs Lab 09/15/13 0840  NA 133*  K 5.2*  CO2 21*  GLUCOSE 334*  BUN 64.9*  CREATININE 2.2*  CALCIUM 9.6   GFR Estimated Creatinine Clearance: 22.9 ml/min (by C-G formula based on Cr of 2.2). Liver Function Tests:  Recent Labs Lab 09/15/13 0840  AST 14  ALT 35  ALKPHOS 89  BILITOT 0.33  PROT 5.9*  ALBUMIN 3.2*    CBC:  Recent Labs Lab 09/15/13 0839  WBC 16.9*  NEUTROABS 15.8*  HGB 10.6*  HCT 33.6*  MCV 88.1  PLT 243    Studies:  No results found.   RADIOGRAPHIC STUDIES: None  ASSESSMENT: Westley Gambles Mckiernan 73 y.o. female with a history of Diffuse large B cell lymphoma - Plan: CBC with Differential in 1 month, CBC with Differential,  Comprehensive metabolic panel, Comprehensive metabolic panel, Lactate dehydrogenase, Lactate dehydrogenase   PLAN:   1. Acute of Chronic Renal Failure, improving. --Patient had recent admission for urosepsis with creatinine of 7.7 on 05/20.  It continues to decrease to 2.2 today.  Her creatinine baseline is 1.5-1.9.   We will give her additional normal saline 500 mL/hr x 2 hours today.  Repeat labs in 2 weeks.   2. Hyperkalemia, mild. --Her potassium is 5.2.  She denies oral potassium supplementation. We will continue hydration and repeat labs including chemistries in 2 weeks.    3. Hypercalcemia, resolved. --She is on decadron 2 mg bid.  She is also on calcitronin 200 units daily. We will tape off.  This should also help #4.   4. 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  -She completed 5 cycles of R+DGP q 21 days (modified doses based on dehydration, electrolyte abnormalities) and we reviewed the CT Scans of C/A/P (2/23) which was consistent with a further moderate increase in retroperitoneal lymphadenopathy complicated by right hydronephrosis. She now has a right ureteral stent in place.   --  We started a less aggressive 4-th line therapy with Rituxan plus revlimid based on Witzig, TE, et. Adrian Prince Oncol. 2011; Jul;22(7):1622-7.  Of the 108 patients with DLBCL 28% or 30 patients had an overall response rate.  Most common adverse events was myelosuppression with grade 4 neutropenia and thrombocytopenia in 17% and 6%, respectively.  They were given lenalidomide 25 mg on days 1-21 every 28 days as tolerated or until progression.  Given her renal insufficiency, we started at 15 mg every other day.  In addition, we gave rituximab 375 mg/m2 every 28 days started on 08/09/2013.    --We will administer rituxan today and hold Revlimid until kidney function is recovered.  We will start  revlimid upon next visit.    5. S/P R. Hydronephrosis, s/p R ureteral stent (04/03) --Patient had R ureteral stent placed on 04/03.  She is scheduled for exchange on 06/22.   6. Leukocytosis  -- Likely secondary to decadron.  She is asymptomatic for fever.    7. Anemia secondary to #4 or chemotherapy.  --Hgb improved to 10.6 today.    8. Hypothyroidism: Levothyroxine per PCP.   9. Hypertension: Hold  in the setting of lower BPs.  10. Diabetes mellitus, type II: SSI.   11. Hyperlipidemia: Pravastatin per PCP.  12.History of atrial fibrillation: Diltiazem, pacemaker. Coumadin being managed per her PCP previously.   13. COPD/ Chronic Bronchitis exacerbation.  --Patient advised to continue albuterol q 6 hours as needed.  14. Follow-up in 4 weeks for a symptom visit and for consideration of rituxan.  Labs in 2 weeks including chemistries and CBC.   All questions were answered. The patient knows to call the clinic with any problems, questions or concerns. We can certainly see the patient much sooner if necessary.  I spent 15 minutes counseling the patient face to face. The total time spent in the appointment was 25 minutes.    Concha Norway, MD 09/15/2013 12:26 PM

## 2013-09-15 NOTE — Patient Instructions (Signed)

## 2013-09-15 NOTE — Progress Notes (Signed)
Pt arrived to [place] from [facility] with PAC accessed from 09/06/13. PAC flushed and de-accessed. Emla cream applied per pt request. Will access pt's port after emla cream has taken effect.  1050: Dr. [name] aware of all pt's lab results. Orders received for 1 liter of NS prior to Rituxan.   1500: Dr. [name] notified of pt's blood pressure of 98/38 after increasing Rituxan to 200 mg/hr; pt asymptomatic, Rituxan paused and NS started.  Per Dr. [name] okay to give IV fluids if needed, proceed with Rituxan as tolerated.   15:25: Restarted Rituxan at 200 mg/hr rate with BP of 108/51.  NS started at 250 ml/hr.

## 2013-09-15 NOTE — Progress Notes (Signed)
1335-BP-99/50.  Dr. [name] notified and order received to decrease Benadryl to 25 mg PO pre Rituxan.

## 2013-09-20 ENCOUNTER — Other Ambulatory Visit: Payer: Medicare Other

## 2013-09-20 ENCOUNTER — Ambulatory Visit: Payer: Medicare Other | Admitting: Physician Assistant

## 2013-09-21 ENCOUNTER — Encounter (HOSPITAL_COMMUNITY): Payer: Self-pay | Admitting: Pharmacy Technician

## 2013-09-21 ENCOUNTER — Encounter: Payer: Self-pay | Admitting: Pharmacist

## 2013-09-21 ENCOUNTER — Encounter (HOSPITAL_COMMUNITY): Payer: Self-pay | Admitting: *Deleted

## 2013-09-21 NOTE — Progress Notes (Signed)
I contacted [facility] (ph# [phone]) & they are monitoring & dosing pts Coumadin as IP there.  The RN reported pt will be discharged home at some point but she does not know a discharge date. Kennith Center, Pharm.D., CPP 09/21/2013@3 :03 PM

## 2013-09-22 ENCOUNTER — Encounter (HOSPITAL_COMMUNITY): Payer: Self-pay | Admitting: *Deleted

## 2013-09-29 ENCOUNTER — Other Ambulatory Visit (HOSPITAL_BASED_OUTPATIENT_CLINIC_OR_DEPARTMENT_OTHER): Payer: Medicare Other

## 2013-09-29 ENCOUNTER — Telehealth: Payer: Self-pay

## 2013-09-29 DIAGNOSIS — D631 Anemia in chronic kidney disease: Secondary | ICD-10-CM

## 2013-09-29 DIAGNOSIS — C8583 Other specified types of non-Hodgkin lymphoma, intra-abdominal lymph nodes: Secondary | ICD-10-CM

## 2013-09-29 DIAGNOSIS — C8593 Non-Hodgkin lymphoma, unspecified, intra-abdominal lymph nodes: Secondary | ICD-10-CM

## 2013-09-29 DIAGNOSIS — I4891 Unspecified atrial fibrillation: Secondary | ICD-10-CM

## 2013-09-29 DIAGNOSIS — N039 Chronic nephritic syndrome with unspecified morphologic changes: Secondary | ICD-10-CM

## 2013-09-29 DIAGNOSIS — N189 Chronic kidney disease, unspecified: Secondary | ICD-10-CM

## 2013-09-29 DIAGNOSIS — E875 Hyperkalemia: Secondary | ICD-10-CM

## 2013-09-29 DIAGNOSIS — C8330 Diffuse large B-cell lymphoma, unspecified site: Secondary | ICD-10-CM

## 2013-09-29 LAB — CBC WITH DIFFERENTIAL/PLATELET
BASO%: 0.1 % (ref 0.0–2.0)
Basophils Absolute: 0 10*3/uL (ref 0.0–0.1)
EOS%: 0 % (ref 0.0–7.0)
Eosinophils Absolute: 0 10*3/uL (ref 0.0–0.5)
HEMATOCRIT: 35.2 % (ref 34.8–46.6)
HGB: 11.1 g/dL — ABNORMAL LOW (ref 11.6–15.9)
LYMPH#: 0.6 10*3/uL — AB (ref 0.9–3.3)
LYMPH%: 6.5 % — ABNORMAL LOW (ref 14.0–49.7)
MCH: 28.5 pg (ref 25.1–34.0)
MCHC: 31.5 g/dL (ref 31.5–36.0)
MCV: 90.5 fL (ref 79.5–101.0)
MONO#: 0.7 10*3/uL (ref 0.1–0.9)
MONO%: 6.9 % (ref 0.0–14.0)
NEUT#: 8.1 10*3/uL — ABNORMAL HIGH (ref 1.5–6.5)
NEUT%: 86.5 % — AB (ref 38.4–76.8)
Platelets: 132 10*3/uL — ABNORMAL LOW (ref 145–400)
RBC: 3.89 10*6/uL (ref 3.70–5.45)
RDW: 20.3 % — AB (ref 11.2–14.5)
WBC: 9.4 10*3/uL (ref 3.9–10.3)

## 2013-09-29 LAB — COMPREHENSIVE METABOLIC PANEL (CC13)
ALT: 32 U/L (ref 0–55)
AST: 16 U/L (ref 5–34)
Albumin: 3.2 g/dL — ABNORMAL LOW (ref 3.5–5.0)
Alkaline Phosphatase: 79 U/L (ref 40–150)
Anion Gap: 12 mEq/L — ABNORMAL HIGH (ref 3–11)
BUN: 47.1 mg/dL — ABNORMAL HIGH (ref 7.0–26.0)
CALCIUM: 9.3 mg/dL (ref 8.4–10.4)
CHLORIDE: 94 meq/L — AB (ref 98–109)
CO2: 25 mEq/L (ref 22–29)
Creatinine: 1.7 mg/dL — ABNORMAL HIGH (ref 0.6–1.1)
Glucose: 445 mg/dl — ABNORMAL HIGH (ref 70–140)
Potassium: 4.7 mEq/L (ref 3.5–5.1)
Sodium: 131 mEq/L — ABNORMAL LOW (ref 136–145)
Total Bilirubin: 0.39 mg/dL (ref 0.20–1.20)
Total Protein: 5.9 g/dL — ABNORMAL LOW (ref 6.4–8.3)

## 2013-09-29 LAB — PROTIME-INR
INR: 3.1 (ref 2.00–3.50)
Protime: 37.2 Seconds — ABNORMAL HIGH (ref 10.6–13.4)

## 2013-09-29 LAB — LACTATE DEHYDROGENASE (CC13): LDH: 553 U/L — ABNORMAL HIGH (ref 125–245)

## 2013-09-29 NOTE — Telephone Encounter (Signed)
S/w Dana Belts RN at [facility] that pts glucose was 445 today. He has already rechecked her glucose and given her insulin.

## 2013-10-04 ENCOUNTER — Other Ambulatory Visit: Payer: Medicare Other

## 2013-10-04 ENCOUNTER — Ambulatory Visit: Payer: Medicare Other

## 2013-10-04 NOTE — H&P (Signed)
History of Present Illness    F/u - referred by Dr. [name]. Patient's PCP is Dr. Melford Aase. Patient is also seeing Dr. Cassell Clement at [place] hydronephrosis - Mar 2015 - h/o NHL dx with severe right hydronephrosis and increasing creatinine.   -July 2012-diagnosed with non-Hodgkin's lymphoma  -January 2013-chemotherapy R CHOP and radiation with good response  -March 2014 recurrence treated with bendamustine/Rituxan  -July 2014 recurrence treated with salvage chemotherapy - R-GPD  -February 2015 recurrence, CT A/P - increasing retroperitoneal lymphadenopathy with severe right hydroureteronephrosis and decreased enhancement of the right kidney down to a lymph node mass anterior to the IVC.   -March 2015 -developed sepsis, pyelonephritis just before stent placement; BUN 21, creatinine 1.7 (Cr as high as 2.1, increased from 1.13 Apr 2013) = underwent a right nephrostomy tube which was internalized to a JJ (6 x 24 cm). BUN 22, CR 1.8.       June 2015 interval hx  Patient hospitalized May 2015 for AKI/CKD- multi-factorial with R-sided hydro, nephrotoxic agents, urosepsis, hypercalcemia, and volume depletion related to diarrhea as well as nephrotoxic agents. Improved with IVF's. Creatinine as high as 7.6 down to 2.58 on d/c. She underwent renal ultrasound which showed right stent in place, no hydronephrosis on the right or the left. I reviewed all the images, Epic notes and labs.     Today, she is well without fever or dysuria. She is not having any stent discomfort.   Past Medical History Problems  1. History of Anxiety (300.00) 2. History of atrial fibrillation (V12.59) 3. History of depression (V11.8) 4. History of diabetes mellitus (V12.29) 5. History of hypercholesterolemia (V12.29) 6. History of hyperthyroidism (V12.29) 7. History of non-Hodgkin's lymphoma (V10.79)  Surgical History Problems  1. History of Cholecystectomy 2. History of Hysterectomy 3. History of  Pacemaker Placement 4. History of Tubal Ligation  Current Meds 1. Albuterol Sulfate (2.5 MG/3ML) 0.083% Inhalation Nebulization Solution;  Therapy: (Recorded:11Mar2015) to Recorded 2. Allopurinol 300 MG Oral Tablet;  Therapy: (Recorded:11Mar2015) to Recorded 3. Calcitonin (Salmon) 200 UNIT/ACT Nasal Solution;  Therapy: (RDEYCXKG:[id number]) to Recorded 4. Citalopram Hydrobromide 20 MG Oral Tablet;  Therapy: (Recorded:11Mar2015) to Recorded 5. Compazine 10 MG Oral Tablet;  Therapy: (Recorded:11Mar2015) to Recorded 6. Coumadin 5 MG Oral Tablet; 2 mg po daily;  Therapy: (FWYOVZCH:[id number]) to Recorded 7. Dexamethasone 2 MG Oral Tablet;  Therapy: (OINOMVEH:[id number]) to Recorded 8. Diltiazem HCl ER 180 MG Oral Capsule Extended Release 24 Hour;  Therapy: (Recorded:11Mar2015) to Recorded 9. Imodium A-D TABS;  Therapy: (Recorded:11Mar2015) to Recorded 10. K-Dur 10 MEQ TBCR;   Therapy: (Recorded:11Mar2015) to Recorded 11. Levofloxacin 750 MG Oral Tablet;   Therapy: (Recorded:30Mar2015) to Recorded 12. Levothyroxine Sodium 50 MCG Oral Tablet;   Therapy: (Recorded:11Mar2015) to Recorded 13. Lidocaine-Prilocaine 2.5-2.5 % External Cream;   Therapy: (Recorded:11Mar2015) to Recorded 14. Mag-Ox 400 MG TABS;   Therapy: (Recorded:11Mar2015) to Recorded 15. MetFORMIN HCl - 500 MG Oral Tablet;   Therapy: (Recorded:11Mar2015) to Recorded 16. Mucinex TB12;   Therapy: (Recorded:11Mar2015) to Recorded 17. NovoLOG SOLN;   Therapy: (EZMOQHUT:[id number]) to Recorded 18. Ondansetron HCl - 8 MG Oral Tablet;   Therapy: (Recorded:11Mar2015) to Recorded 19. Pravastatin Sodium 40 MG Oral Tablet;   Therapy: (Recorded:11Mar2015) to Recorded 20. Spiriva HandiHaler 18 MCG Inhalation Capsule;   Therapy: (Recorded:11Mar2015) to Recorded 21. Urelle 81 MG Oral Tablet;   Therapy: (SFKCLEXN:[id number]) to Recorded 22. Vitamin D3 2000 UNIT Oral Capsule;   Therapy: (Recorded:11Mar2015) to  Recorded  Allergies Medication  1. Codeine Derivatives  2. Penicillins  Family History Problems  1. Family history of Death of family member : Mother, Father 2. Family history of prostate cancer (D35.70) : Brother 3. Family history of renal failure (V18.69) : Sister 4. Family history of stroke (V17.1) : Mother, Father  Social History Problems  1. Denied: History of Alcohol use 2. Caffeine use (V49.89)   2-3 a day 3. Former smoker (V15.82) 4. Former smoker (V15.82)   smoked 1-2 ppd x 60 years 5. Married 6. 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  The following images/tracing/specimen were independently visualized:  u/s.    Assessment Assessed  1. Hydronephrosis (591)  Plan Hydronephrosis  1. Follow-up Schedule Surgery Office  Follow-up  Status: Hold For - Appointment   Requested for: [phone]  Discussion/Summary Discussed with patient we will set up stent change in 2-3 weeks. I'll cover her with vancomycin and Cipro given her past culture results. She is asymptomatic today.  Signatures Electronically signed by : Festus Aloe, M.D.; Sep 12 2013  4:28PM EST

## 2013-10-05 NOTE — Progress Notes (Signed)
Confirmed with Chasity Little at Dr. Selena Lesser - pt does not need to stop coumadin for procedure.

## 2013-10-06 ENCOUNTER — Ambulatory Visit (HOSPITAL_COMMUNITY): Admission: RE | Admit: 2013-10-06 | Payer: Medicare Other | Source: Ambulatory Visit | Admitting: Urology

## 2013-10-06 ENCOUNTER — Other Ambulatory Visit: Payer: Self-pay | Admitting: *Deleted

## 2013-10-06 ENCOUNTER — Inpatient Hospital Stay (HOSPITAL_COMMUNITY): Payer: Medicare Other

## 2013-10-06 ENCOUNTER — Other Ambulatory Visit: Payer: Self-pay

## 2013-10-06 ENCOUNTER — Inpatient Hospital Stay (HOSPITAL_COMMUNITY)
Admission: EM | Admit: 2013-10-06 | Discharge: 2013-10-18 | DRG: 871 | Disposition: A | Discharge status: Skilled Nursing Facility | Payer: Medicare Other | Attending: Internal Medicine | Admitting: Internal Medicine

## 2013-10-06 ENCOUNTER — Encounter (HOSPITAL_COMMUNITY): Payer: Self-pay | Admitting: Emergency Medicine

## 2013-10-06 ENCOUNTER — Other Ambulatory Visit: Payer: Self-pay | Admitting: Urology

## 2013-10-06 ENCOUNTER — Encounter (HOSPITAL_COMMUNITY)
Admission: EM | Disposition: A | Discharge status: Skilled Nursing Facility | Payer: Self-pay | Source: Home / Self Care | Attending: Pulmonary Disease

## 2013-10-06 ENCOUNTER — Emergency Department (HOSPITAL_COMMUNITY): Payer: Medicare Other

## 2013-10-06 DIAGNOSIS — I4510 Unspecified right bundle-branch block: Secondary | ICD-10-CM | POA: Diagnosis present

## 2013-10-06 DIAGNOSIS — Z794 Long term (current) use of insulin: Secondary | ICD-10-CM

## 2013-10-06 DIAGNOSIS — M1990 Unspecified osteoarthritis, unspecified site: Secondary | ICD-10-CM | POA: Diagnosis present

## 2013-10-06 DIAGNOSIS — I2510 Atherosclerotic heart disease of native coronary artery without angina pectoris: Secondary | ICD-10-CM

## 2013-10-06 DIAGNOSIS — N183 Chronic kidney disease, stage 3 unspecified: Secondary | ICD-10-CM | POA: Diagnosis present

## 2013-10-06 DIAGNOSIS — E039 Hypothyroidism, unspecified: Secondary | ICD-10-CM | POA: Diagnosis present

## 2013-10-06 DIAGNOSIS — F32A Depression, unspecified: Secondary | ICD-10-CM

## 2013-10-06 DIAGNOSIS — Z87440 Personal history of urinary (tract) infections: Secondary | ICD-10-CM

## 2013-10-06 DIAGNOSIS — J411 Mucopurulent chronic bronchitis: Secondary | ICD-10-CM

## 2013-10-06 DIAGNOSIS — J418 Mixed simple and mucopurulent chronic bronchitis: Secondary | ICD-10-CM

## 2013-10-06 DIAGNOSIS — D709 Neutropenia, unspecified: Secondary | ICD-10-CM

## 2013-10-06 DIAGNOSIS — E1169 Type 2 diabetes mellitus with other specified complication: Secondary | ICD-10-CM

## 2013-10-06 DIAGNOSIS — Z950 Presence of cardiac pacemaker: Secondary | ICD-10-CM | POA: Diagnosis present

## 2013-10-06 DIAGNOSIS — Z823 Family history of stroke: Secondary | ICD-10-CM

## 2013-10-06 DIAGNOSIS — N135 Crossing vessel and stricture of ureter without hydronephrosis: Secondary | ICD-10-CM | POA: Diagnosis present

## 2013-10-06 DIAGNOSIS — Z5181 Encounter for therapeutic drug level monitoring: Secondary | ICD-10-CM

## 2013-10-06 DIAGNOSIS — Z880 Allergy status to penicillin: Secondary | ICD-10-CM

## 2013-10-06 DIAGNOSIS — D696 Thrombocytopenia, unspecified: Secondary | ICD-10-CM

## 2013-10-06 DIAGNOSIS — D638 Anemia in other chronic diseases classified elsewhere: Secondary | ICD-10-CM | POA: Diagnosis present

## 2013-10-06 DIAGNOSIS — K5900 Constipation, unspecified: Secondary | ICD-10-CM | POA: Diagnosis present

## 2013-10-06 DIAGNOSIS — F329 Major depressive disorder, single episode, unspecified: Secondary | ICD-10-CM

## 2013-10-06 DIAGNOSIS — T451X5A Adverse effect of antineoplastic and immunosuppressive drugs, initial encounter: Secondary | ICD-10-CM | POA: Diagnosis present

## 2013-10-06 DIAGNOSIS — R5381 Other malaise: Secondary | ICD-10-CM | POA: Diagnosis present

## 2013-10-06 DIAGNOSIS — J438 Other emphysema: Secondary | ICD-10-CM

## 2013-10-06 DIAGNOSIS — E871 Hypo-osmolality and hyponatremia: Secondary | ICD-10-CM | POA: Diagnosis present

## 2013-10-06 DIAGNOSIS — Z515 Encounter for palliative care: Secondary | ICD-10-CM

## 2013-10-06 DIAGNOSIS — E669 Obesity, unspecified: Secondary | ICD-10-CM

## 2013-10-06 DIAGNOSIS — R6521 Severe sepsis with septic shock: Secondary | ICD-10-CM

## 2013-10-06 DIAGNOSIS — A419 Sepsis, unspecified organism: Secondary | ICD-10-CM | POA: Diagnosis present

## 2013-10-06 DIAGNOSIS — N170 Acute kidney failure with tubular necrosis: Secondary | ICD-10-CM | POA: Diagnosis present

## 2013-10-06 DIAGNOSIS — N184 Chronic kidney disease, stage 4 (severe): Secondary | ICD-10-CM | POA: Diagnosis present

## 2013-10-06 DIAGNOSIS — N1330 Unspecified hydronephrosis: Secondary | ICD-10-CM | POA: Diagnosis present

## 2013-10-06 DIAGNOSIS — N179 Acute kidney failure, unspecified: Secondary | ICD-10-CM | POA: Diagnosis present

## 2013-10-06 DIAGNOSIS — C8589 Other specified types of non-Hodgkin lymphoma, extranodal and solid organ sites: Secondary | ICD-10-CM

## 2013-10-06 DIAGNOSIS — D65 Disseminated intravascular coagulation [defibrination syndrome]: Secondary | ICD-10-CM | POA: Diagnosis present

## 2013-10-06 DIAGNOSIS — I4891 Unspecified atrial fibrillation: Secondary | ICD-10-CM | POA: Diagnosis present

## 2013-10-06 DIAGNOSIS — J410 Simple chronic bronchitis: Secondary | ICD-10-CM

## 2013-10-06 DIAGNOSIS — J45901 Unspecified asthma with (acute) exacerbation: Secondary | ICD-10-CM

## 2013-10-06 DIAGNOSIS — M109 Gout, unspecified: Secondary | ICD-10-CM | POA: Diagnosis present

## 2013-10-06 DIAGNOSIS — N189 Chronic kidney disease, unspecified: Secondary | ICD-10-CM

## 2013-10-06 DIAGNOSIS — R6520 Severe sepsis without septic shock: Secondary | ICD-10-CM

## 2013-10-06 DIAGNOSIS — J441 Chronic obstructive pulmonary disease with (acute) exacerbation: Secondary | ICD-10-CM | POA: Diagnosis present

## 2013-10-06 DIAGNOSIS — IMO0002 Reserved for concepts with insufficient information to code with codable children: Secondary | ICD-10-CM

## 2013-10-06 DIAGNOSIS — G9340 Encephalopathy, unspecified: Secondary | ICD-10-CM | POA: Diagnosis present

## 2013-10-06 DIAGNOSIS — Z9089 Acquired absence of other organs: Secondary | ICD-10-CM

## 2013-10-06 DIAGNOSIS — Z923 Personal history of irradiation: Secondary | ICD-10-CM

## 2013-10-06 DIAGNOSIS — C8593 Non-Hodgkin lymphoma, unspecified, intra-abdominal lymph nodes: Secondary | ICD-10-CM | POA: Diagnosis present

## 2013-10-06 DIAGNOSIS — A4159 Other Gram-negative sepsis: Principal | ICD-10-CM | POA: Diagnosis present

## 2013-10-06 DIAGNOSIS — Z87891 Personal history of nicotine dependence: Secondary | ICD-10-CM

## 2013-10-06 DIAGNOSIS — Z8049 Family history of malignant neoplasm of other genital organs: Secondary | ICD-10-CM

## 2013-10-06 DIAGNOSIS — E119 Type 2 diabetes mellitus without complications: Secondary | ICD-10-CM | POA: Diagnosis present

## 2013-10-06 DIAGNOSIS — D6481 Anemia due to antineoplastic chemotherapy: Secondary | ICD-10-CM

## 2013-10-06 DIAGNOSIS — Z7189 Other specified counseling: Secondary | ICD-10-CM

## 2013-10-06 DIAGNOSIS — I9589 Other hypotension: Secondary | ICD-10-CM

## 2013-10-06 DIAGNOSIS — E876 Hypokalemia: Secondary | ICD-10-CM

## 2013-10-06 DIAGNOSIS — C8330 Diffuse large B-cell lymphoma, unspecified site: Secondary | ICD-10-CM | POA: Diagnosis present

## 2013-10-06 DIAGNOSIS — Z6841 Body Mass Index (BMI) 40.0 and over, adult: Secondary | ICD-10-CM

## 2013-10-06 DIAGNOSIS — N390 Urinary tract infection, site not specified: Secondary | ICD-10-CM | POA: Diagnosis present

## 2013-10-06 DIAGNOSIS — Z885 Allergy status to narcotic agent status: Secondary | ICD-10-CM

## 2013-10-06 DIAGNOSIS — R5383 Other fatigue: Secondary | ICD-10-CM

## 2013-10-06 DIAGNOSIS — Z7901 Long term (current) use of anticoagulants: Secondary | ICD-10-CM

## 2013-10-06 DIAGNOSIS — R531 Weakness: Secondary | ICD-10-CM

## 2013-10-06 DIAGNOSIS — R040 Epistaxis: Secondary | ICD-10-CM | POA: Diagnosis not present

## 2013-10-06 DIAGNOSIS — R609 Edema, unspecified: Secondary | ICD-10-CM | POA: Diagnosis present

## 2013-10-06 DIAGNOSIS — D61810 Antineoplastic chemotherapy induced pancytopenia: Secondary | ICD-10-CM | POA: Diagnosis present

## 2013-10-06 DIAGNOSIS — J449 Chronic obstructive pulmonary disease, unspecified: Secondary | ICD-10-CM | POA: Diagnosis present

## 2013-10-06 DIAGNOSIS — R000 Tachycardia, unspecified: Secondary | ICD-10-CM | POA: Diagnosis not present

## 2013-10-06 DIAGNOSIS — I10 Essential (primary) hypertension: Secondary | ICD-10-CM

## 2013-10-06 DIAGNOSIS — Z79899 Other long term (current) drug therapy: Secondary | ICD-10-CM

## 2013-10-06 DIAGNOSIS — R4182 Altered mental status, unspecified: Secondary | ICD-10-CM

## 2013-10-06 DIAGNOSIS — E785 Hyperlipidemia, unspecified: Secondary | ICD-10-CM

## 2013-10-06 DIAGNOSIS — R5081 Fever presenting with conditions classified elsewhere: Secondary | ICD-10-CM

## 2013-10-06 DIAGNOSIS — I129 Hypertensive chronic kidney disease with stage 1 through stage 4 chronic kidney disease, or unspecified chronic kidney disease: Secondary | ICD-10-CM | POA: Diagnosis present

## 2013-10-06 DIAGNOSIS — R310 Gross hematuria: Secondary | ICD-10-CM | POA: Diagnosis present

## 2013-10-06 DIAGNOSIS — Z9221 Personal history of antineoplastic chemotherapy: Secondary | ICD-10-CM

## 2013-10-06 DIAGNOSIS — Z66 Do not resuscitate: Secondary | ICD-10-CM | POA: Diagnosis not present

## 2013-10-06 HISTORY — DX: Sepsis, unspecified organism: A41.9

## 2013-10-06 HISTORY — DX: Encephalopathy, unspecified: G93.40

## 2013-10-06 HISTORY — DX: Urinary tract infection, site not specified: N39.0

## 2013-10-06 HISTORY — DX: Acute kidney failure, unspecified: N17.9

## 2013-10-06 HISTORY — DX: Antineoplastic chemotherapy induced pancytopenia: D61.810

## 2013-10-06 HISTORY — DX: Enterococcus as the cause of diseases classified elsewhere: B95.2

## 2013-10-06 HISTORY — DX: Neutropenia, unspecified: D70.9

## 2013-10-06 LAB — CBC WITH DIFFERENTIAL/PLATELET
BASOS ABS: 0 10*3/uL (ref 0.0–0.1)
BASOS ABS: 0 10*3/uL (ref 0.0–0.1)
Basophils Relative: 0 % (ref 0–1)
Basophils Relative: 0 % (ref 0–1)
Eosinophils Absolute: 0 10*3/uL (ref 0.0–0.7)
Eosinophils Absolute: 0 10*3/uL (ref 0.0–0.7)
Eosinophils Relative: 0 % (ref 0–5)
Eosinophils Relative: 0 % (ref 0–5)
HCT: 29.1 % — ABNORMAL LOW (ref 36.0–46.0)
HEMATOCRIT: 31.1 % — AB (ref 36.0–46.0)
Hemoglobin: 10.4 g/dL — ABNORMAL LOW (ref 12.0–15.0)
Hemoglobin: 9.7 g/dL — ABNORMAL LOW (ref 12.0–15.0)
LYMPHS PCT: 3 % — AB (ref 12–46)
LYMPHS PCT: 4 % — AB (ref 12–46)
Lymphs Abs: 0.2 10*3/uL — ABNORMAL LOW (ref 0.7–4.0)
Lymphs Abs: 0.2 10*3/uL — ABNORMAL LOW (ref 0.7–4.0)
MCH: 29.6 pg (ref 26.0–34.0)
MCH: 29.6 pg (ref 26.0–34.0)
MCHC: 33.4 g/dL (ref 30.0–36.0)
MCHC: 33.3 g/dL (ref 30.0–36.0)
MCV: 88.6 fL (ref 78.0–100.0)
MCV: 88.7 fL (ref 78.0–100.0)
MONOS PCT: 6 % (ref 3–12)
MONOS PCT: 3 % (ref 3–12)
Monocytes Absolute: 0.4 10*3/uL (ref 0.1–1.0)
Monocytes Absolute: 0.2 10*3/uL (ref 0.1–1.0)
NEUTROS ABS: 5.8 10*3/uL (ref 1.7–7.7)
Neutro Abs: 6.6 10*3/uL (ref 1.7–7.7)
Neutrophils Relative %: 91 % — ABNORMAL HIGH (ref 43–77)
Neutrophils Relative %: 93 % — ABNORMAL HIGH (ref 43–77)
PLATELETS: UNDETERMINED 10*3/uL (ref 150–400)
Platelets: 24 10*3/uL — CL (ref 150–400)
RBC: 3.51 MIL/uL — ABNORMAL LOW (ref 3.87–5.11)
RBC: 3.28 MIL/uL — ABNORMAL LOW (ref 3.87–5.11)
RDW: 20 % — ABNORMAL HIGH (ref 11.5–15.5)
RDW: 20.1 % — AB (ref 11.5–15.5)
WBC Morphology: INCREASED
WBC: 7.2 10*3/uL (ref 4.0–10.5)
WBC: 6.2 10*3/uL (ref 4.0–10.5)

## 2013-10-06 LAB — COMPREHENSIVE METABOLIC PANEL
ALBUMIN: 2.1 g/dL — AB (ref 3.5–5.2)
ALBUMIN: 2 g/dL — AB (ref 3.5–5.2)
ALK PHOS: 107 U/L (ref 39–117)
ALT: 41 U/L — ABNORMAL HIGH (ref 0–35)
ALT: 36 U/L — ABNORMAL HIGH (ref 0–35)
AST: 18 U/L (ref 0–37)
AST: 16 U/L (ref 0–37)
Alkaline Phosphatase: 109 U/L (ref 39–117)
BILIRUBIN TOTAL: 0.2 mg/dL — AB (ref 0.3–1.2)
BUN: 71 mg/dL — ABNORMAL HIGH (ref 6–23)
BUN: 71 mg/dL — AB (ref 6–23)
CHLORIDE: 89 meq/L — AB (ref 96–112)
CO2: 21 mEq/L (ref 19–32)
CO2: 23 mEq/L (ref 19–32)
Calcium: 9.3 mg/dL (ref 8.4–10.5)
Calcium: 9 mg/dL (ref 8.4–10.5)
Chloride: 91 mEq/L — ABNORMAL LOW (ref 96–112)
Creatinine, Ser: 2.43 mg/dL — ABNORMAL HIGH (ref 0.50–1.10)
Creatinine, Ser: 2.56 mg/dL — ABNORMAL HIGH (ref 0.50–1.10)
GFR calc Af Amer: 22 mL/min — ABNORMAL LOW (ref >90)
GFR calc non Af Amer: 19 mL/min — ABNORMAL LOW (ref >90)
GFR calc non Af Amer: 18 mL/min — ABNORMAL LOW (ref >90)
GFR, EST AFRICAN AMERICAN: 20 mL/min — AB (ref >90)
Glucose, Bld: 327 mg/dL — ABNORMAL HIGH (ref 70–99)
Glucose, Bld: 341 mg/dL — ABNORMAL HIGH (ref 70–99)
POTASSIUM: 4.8 meq/L (ref 3.7–5.3)
Potassium: 4.7 mEq/L (ref 3.7–5.3)
Sodium: 129 mEq/L — ABNORMAL LOW (ref 137–147)
Sodium: 132 mEq/L — ABNORMAL LOW (ref 137–147)
TOTAL PROTEIN: 5.5 g/dL — AB (ref 6.0–8.3)
Total Bilirubin: 0.4 mg/dL (ref 0.3–1.2)
Total Protein: 6 g/dL (ref 6.0–8.3)

## 2013-10-06 LAB — PROTIME-INR
INR: 2.86 — ABNORMAL HIGH (ref 0.00–1.49)
INR: 2.91 — AB (ref 0.00–1.49)
PROTHROMBIN TIME: 30.4 s — AB (ref 11.6–15.2)
Prothrombin Time: 30 seconds — ABNORMAL HIGH (ref 11.6–15.2)

## 2013-10-06 LAB — TROPONIN I
Troponin I: <0.3 ng/mL (ref <0.30)
Troponin I: <0.3 ng/mL (ref <0.30)
Troponin I: <0.3 ng/mL (ref <0.30)

## 2013-10-06 LAB — URINE MICROSCOPIC-ADD ON

## 2013-10-06 LAB — URINALYSIS, ROUTINE W REFLEX MICROSCOPIC
BILIRUBIN URINE: NEGATIVE
GLUCOSE, UA: 250 mg/dL — AB
Ketones, ur: NEGATIVE mg/dL
Nitrite: NEGATIVE
PH: 6 (ref 5.0–8.0)
Protein, ur: 100 mg/dL — AB
Specific Gravity, Urine: 1.012 (ref 1.005–1.030)
Urobilinogen, UA: 0.2 mg/dL (ref 0.0–1.0)

## 2013-10-06 LAB — D-DIMER, QUANTITATIVE: D-Dimer, Quant: 2.83 ug/mL-FEU — ABNORMAL HIGH (ref 0.00–0.48)

## 2013-10-06 LAB — LIPASE, BLOOD: Lipase: 34 U/L (ref 11–59)

## 2013-10-06 LAB — GLUCOSE, CAPILLARY
Glucose-Capillary: 333 mg/dL — ABNORMAL HIGH (ref 70–99)
Glucose-Capillary: 275 mg/dL — ABNORMAL HIGH (ref 70–99)
Glucose-Capillary: 290 mg/dL — ABNORMAL HIGH (ref 70–99)

## 2013-10-06 LAB — HEMOGLOBIN A1C
Hgb A1c MFr Bld: 8.5 % — ABNORMAL HIGH (ref <5.7)
MEAN PLASMA GLUCOSE: 197 mg/dL — AB (ref <117)

## 2013-10-06 LAB — PROCALCITONIN: PROCALCITONIN: 6.24 ng/mL

## 2013-10-06 LAB — LACTIC ACID, PLASMA: Lactic Acid, Venous: 4.7 mmol/L — ABNORMAL HIGH (ref 0.5–2.2)

## 2013-10-06 LAB — APTT: APTT: 49 s — AB (ref 24–37)

## 2013-10-06 LAB — PRO B NATRIURETIC PEPTIDE: PRO B NATRI PEPTIDE: 8619 pg/mL — AB (ref 0–125)

## 2013-10-06 LAB — PHOSPHORUS: PHOSPHORUS: 4.6 mg/dL (ref 2.3–4.6)

## 2013-10-06 LAB — I-STAT CG4 LACTIC ACID, ED: Lactic Acid, Venous: 1.83 mmol/L (ref 0.5–2.2)

## 2013-10-06 LAB — TSH: TSH: 0.105 u[IU]/mL — ABNORMAL LOW (ref 0.350–4.500)

## 2013-10-06 LAB — MAGNESIUM: Magnesium: 2 mg/dL (ref 1.5–2.5)

## 2013-10-06 LAB — MRSA PCR SCREENING: MRSA by PCR: NEGATIVE

## 2013-10-06 SURGERY — CYSTOSCOPY, FLEXIBLE, WITH STENT REPLACEMENT
Anesthesia: General

## 2013-10-06 MED ORDER — CALCITONIN (SALMON) 200 UNIT/ACT NA SOLN
1.0000 | Freq: Every day | NASAL | Status: DC
  Administered 2013-10-07 – 2013-10-10 (×4): 1 via NASAL
  Filled 2013-10-06 (×2): qty 3.7

## 2013-10-06 MED ORDER — INSULIN ASPART 100 UNIT/ML ~~LOC~~ SOLN
0.0000 [IU] | Freq: Every day | SUBCUTANEOUS | Status: DC
  Administered 2013-10-06: 3 [IU] via SUBCUTANEOUS
  Administered 2013-10-07: 2 [IU] via SUBCUTANEOUS
  Administered 2013-10-07: 3 [IU] via SUBCUTANEOUS

## 2013-10-06 MED ORDER — VANCOMYCIN HCL IN DEXTROSE 1-5 GM/200ML-% IV SOLN
1000.0000 mg | INTRAVENOUS | Status: DC
  Administered 2013-10-06 – 2013-10-07 (×2): 1000 mg via INTRAVENOUS
  Filled 2013-10-06 (×2): qty 200

## 2013-10-06 MED ORDER — ACETAMINOPHEN 650 MG RE SUPP
650.0000 mg | Freq: Four times a day (QID) | RECTAL | Status: DC | PRN
  Administered 2013-10-07: 650 mg via RECTAL
  Filled 2013-10-06: qty 1

## 2013-10-06 MED ORDER — MAGNESIUM OXIDE 400 (241.3 MG) MG PO TABS
200.0000 mg | ORAL_TABLET | Freq: Every day | ORAL | Status: DC
  Administered 2013-10-07 – 2013-10-18 (×11): 200 mg via ORAL
  Filled 2013-10-06: qty 0.5
  Filled 2013-10-06 (×2): qty 1
  Filled 2013-10-06: qty 0.5
  Filled 2013-10-06: qty 1
  Filled 2013-10-06 (×4): qty 0.5
  Filled 2013-10-06: qty 1
  Filled 2013-10-06 (×3): qty 0.5

## 2013-10-06 MED ORDER — LEVOTHYROXINE SODIUM 50 MCG PO TABS
50.0000 ug | ORAL_TABLET | Freq: Every day | ORAL | Status: DC
  Administered 2013-10-07 – 2013-10-18 (×11): 50 ug via ORAL
  Filled 2013-10-06 (×12): qty 1

## 2013-10-06 MED ORDER — SODIUM CHLORIDE 0.9 % IV SOLN
INTRAVENOUS | Status: DC
  Administered 2013-10-06: 09:00:00 via INTRAVENOUS

## 2013-10-06 MED ORDER — ACETAMINOPHEN 325 MG PO TABS
650.0000 mg | ORAL_TABLET | Freq: Four times a day (QID) | ORAL | Status: DC | PRN
  Administered 2013-10-06: 650 mg via ORAL
  Filled 2013-10-06: qty 2

## 2013-10-06 MED ORDER — SIMVASTATIN 10 MG PO TABS: 20.0000 mg | ORAL_TABLET | Freq: Every day | ORAL | Status: DC

## 2013-10-06 MED ORDER — IPRATROPIUM BROMIDE 0.02 % IN SOLN: 0.5000 mg | RESPIRATORY_TRACT | Status: DC | PRN

## 2013-10-06 MED ORDER — DEXAMETHASONE 2 MG PO TABS
2.0000 mg | ORAL_TABLET | Freq: Two times a day (BID) | ORAL | Status: DC
Start: 2013-10-06 — End: 2013-10-07
  Administered 2013-10-07: 2 mg via ORAL
  Filled 2013-10-06: qty 1

## 2013-10-06 MED ORDER — WARFARIN - PHYSICIAN DOSING INPATIENT: Freq: Every day | Status: DC

## 2013-10-06 MED ORDER — INSULIN ASPART 100 UNIT/ML ~~LOC~~ SOLN
0.0000 [IU] | Freq: Three times a day (TID) | SUBCUTANEOUS | Status: DC
  Administered 2013-10-06: 11 [IU] via SUBCUTANEOUS
  Administered 2013-10-06: 8 [IU] via SUBCUTANEOUS
  Administered 2013-10-07: 5 [IU] via SUBCUTANEOUS
  Administered 2013-10-07: 8 [IU] via SUBCUTANEOUS
  Administered 2013-10-07: 5 [IU] via SUBCUTANEOUS

## 2013-10-06 MED ORDER — PHENYLEPHRINE HCL 10 MG/ML IJ SOLN
30.0000 ug/min | INTRAVENOUS | Status: DC
  Administered 2013-10-06: 200 ug/min via INTRAVENOUS
  Administered 2013-10-06: 30 ug/min via INTRAVENOUS
  Filled 2013-10-06 (×4): qty 1

## 2013-10-06 MED ORDER — TIOTROPIUM BROMIDE MONOHYDRATE 18 MCG IN CAPS
18.0000 ug | ORAL_CAPSULE | Freq: Every day | RESPIRATORY_TRACT | Status: DC
  Filled 2013-10-06: qty 5

## 2013-10-06 MED ORDER — SODIUM CHLORIDE 0.9 % IJ SOLN
3.0000 mL | Freq: Two times a day (BID) | INTRAMUSCULAR | Status: DC
  Administered 2013-10-07 – 2013-10-08 (×4): 3 mL via INTRAVENOUS
  Administered 2013-10-09: 10 mL via INTRAVENOUS
  Administered 2013-10-11 – 2013-10-16 (×5): 3 mL via INTRAVENOUS

## 2013-10-06 MED ORDER — ACETAMINOPHEN 650 MG RE SUPP
650.0000 mg | Freq: Once | RECTAL | Status: AC
  Administered 2013-10-06: 650 mg via RECTAL
  Filled 2013-10-06: qty 1

## 2013-10-06 MED ORDER — BISACODYL 5 MG PO TBEC
5.0000 mg | DELAYED_RELEASE_TABLET | Freq: Every day | ORAL | Status: DC
  Administered 2013-10-07 – 2013-10-18 (×4): 5 mg via ORAL
  Filled 2013-10-06 (×5): qty 1

## 2013-10-06 MED ORDER — DEXTROSE 5 % IV SOLN
2.0000 g | Freq: Once | INTRAVENOUS | Status: AC
  Administered 2013-10-06: 2 g via INTRAVENOUS
  Filled 2013-10-06: qty 2

## 2013-10-06 MED ORDER — PIPERACILLIN-TAZOBACTAM 3.375 G IVPB
3.3750 g | Freq: Three times a day (TID) | INTRAVENOUS | Status: DC
  Administered 2013-10-06 – 2013-10-07 (×4): 3.375 g via INTRAVENOUS
  Filled 2013-10-06 (×3): qty 50

## 2013-10-06 MED ORDER — SODIUM CHLORIDE 0.9 % IV BOLUS (SEPSIS)
500.0000 mL | Freq: Once | INTRAVENOUS | Status: AC
  Administered 2013-10-06: 500 mL via INTRAVENOUS

## 2013-10-06 MED ORDER — ALBUTEROL SULFATE (2.5 MG/3ML) 0.083% IN NEBU: 2.5000 mg | INHALATION_SOLUTION | RESPIRATORY_TRACT | Status: DC | PRN

## 2013-10-06 MED ORDER — ALLOPURINOL 100 MG PO TABS: 200.0000 mg | ORAL_TABLET | Freq: Every day | ORAL | Status: DC

## 2013-10-06 MED ORDER — SODIUM CHLORIDE 0.9 % IV BOLUS (SEPSIS)
1000.0000 mL | Freq: Once | INTRAVENOUS | Status: AC
  Administered 2013-10-06: 1000 mL via INTRAVENOUS

## 2013-10-06 MED ORDER — ONDANSETRON HCL 4 MG/2ML IJ SOLN: 4.0000 mg | Freq: Four times a day (QID) | INTRAMUSCULAR | Status: DC | PRN

## 2013-10-06 MED ORDER — ACETAMINOPHEN 650 MG RE SUPP: 650.0000 mg | Freq: Once | RECTAL | Status: DC

## 2013-10-06 MED ORDER — DEXTROSE 5 % IV SOLN: 1.0000 g | INTRAVENOUS | Status: DC

## 2013-10-06 MED ORDER — SODIUM CHLORIDE 0.9 % IV BOLUS (SEPSIS): 1000.0000 mL | Freq: Once | INTRAVENOUS | Status: DC

## 2013-10-06 MED ORDER — URELLE 81 MG PO TABS
1.0000 | ORAL_TABLET | Freq: Three times a day (TID) | ORAL | Status: DC | PRN
  Filled 2013-10-06: qty 1

## 2013-10-06 MED ORDER — DEXTROSE 5 % IV SOLN
30.0000 ug/min | INTRAVENOUS | Status: DC
  Administered 2013-10-06: 200 ug/min via INTRAVENOUS
  Administered 2013-10-07: 160 ug/min via INTRAVENOUS
  Administered 2013-10-07: 75 ug/min via INTRAVENOUS
  Administered 2013-10-07: 90 ug/min via INTRAVENOUS
  Administered 2013-10-07: 100 ug/min via INTRAVENOUS
  Administered 2013-10-08: 40 ug/min via INTRAVENOUS
  Administered 2013-10-08 (×2): 30 ug/min via INTRAVENOUS
  Filled 2013-10-06 (×5): qty 4

## 2013-10-06 MED ORDER — SODIUM CHLORIDE 0.9 % IV SOLN: INTRAVENOUS | Status: DC

## 2013-10-06 MED ORDER — ONDANSETRON HCL 4 MG PO TABS: 4.0000 mg | ORAL_TABLET | Freq: Four times a day (QID) | ORAL | Status: DC | PRN

## 2013-10-06 MED ORDER — WARFARIN SODIUM 2.5 MG PO TABS
2.5000 mg | ORAL_TABLET | Freq: Every day | ORAL | Status: DC
  Filled 2013-10-06: qty 1

## 2013-10-06 NOTE — Progress Notes (Signed)
Brief Pharmacy Consult Note:  Please see previous progress note from Allena Napoleon, [facility] for full details.   Clarified with Dr. Charlies Silvers that antibiotic regimen intended is Vancomycin and Zosyn, and dc ceftriaxone.  Anti-infectives: 6/26 >> Ceftriaxone x 1 6/26 >> Vancomycin >> 6/26 >> Zosyn >>  Labs / Vitals: Tmax: 100.4 WBCs: wnl Renal: SCr 2.43, CrCl 20.7 CG  PCT: 6.24  Cultures: 3/16 urine - Klebsiella pneum., Citrobacter freundii 4/22 urine - Enterococcus 5/20 urine - Enterococcus 6/26 blood: sent 6/26 urine: sent 6/26 sputum: ordered  Plan: Zosyn 3.375gm IV q8h (4hr extended infusions) - monitor for potential reactions given unknown hx penicillin allergy CrCl is borderline for dose adjustment - will follow closely and change if needed.  Peggyann Juba, PharmD, BCPS Pager: [phone] 10/06/2013 11:41 AM

## 2013-10-06 NOTE — ED Provider Notes (Signed)
CSN: [id number]     Arrival date & time 10/06/13  8250 History   First MD Initiated Contact with Patient 10/06/13 [phone]     Chief Complaint  Patient presents with  . Fever     (Consider location/radiation/quality/duration/timing/severity/associated sxs/prior Treatment) HPI Comments: Patient is a 73 year old female with history of sick sinus syndrome, hypotension, dyslipidemia, atrial fibrillation, hypertension, diabetes, hypothyroidism, diffuse large B cell lymphoma, non-Hodgkin's lymphoma who presents today from [facility] [employer]. She had reported elevated BUN levels. She was given IVF and then developed bilateral pitting edema on her lower extremities and some facial swelling. Her niece is in the room and reports that she has been confused since last night. She was able to recognize her today which she states is an improvement. Generally patient is oriented to person, place, time, and situation. Currently she is oriented to person, but not to place or time. She was found to be in Afib by EMS.   The history is provided by the patient. No language interpreter was used.    Past Medical History  Diagnosis Date  . Sick sinus syndrome   . Hematoma     At the site of the pacemaker insertion.  . Hypotension   . Dyslipidemia   . History of atrial fibrillation   . Arthritis     Osteoarthritis  . Port-a-cath in place 12/08/10  . Status post chemotherapy completed 03/2011    R - CHOP q 3 weeks x 6  . S/P radiation therapy 05/28/2011 - 06/26/2011    Right Abdomen and Right Pelvis/3060 cGy in 17 Fractions  . Hypertension   . ASHD (arteriosclerotic heart disease)   . Gout   . COPD (chronic obstructive pulmonary disease)   . Asthma   . Hyperlipidemia   . Diabetes mellitus   . Hypothyroidism   . Diffuse large B cell lymphoma     Dr. Chisom,oncology  . Cancer 10/2010    large B cell lymphoma  . 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 Pacemaker      left chest  . Dysrhythmia     Hx. A. Fib, sick sinus syndrome- Pacemaker inserted.  . Cataracts, bilateral   . Sepsis     08/2013   . Urinary tract infection 08/2013   . Pancytopenia due to chemotherapy   . Acute renal failure     CKD- Stage III  . Acute encephalopathy     hx of   . Neutropenia   . Enterococcus UTI 08/2013    Past Surgical History  Procedure Laterality Date  . Pacemaker insertion      Lead revision completed August 04, 2006- left chest G. Lovena Le  . Tubal ligation      Bilateral  . Insert / replace / remove pacemaker    . Abdominal hysterectomy    . Biopsy stomach  11/03/10    Soft Tissue Mass, Biopsy, Right Lower Quadrant Mesenteric Mass- High Grade Non-Hodgkins B Cell Lymphoma  with Flow Cytometry  . Bone biopsy  11/24/10    Bone Marrow, Aspirate Biospy, and clot, Left - No Involvement of Non-Hodgkin's Lymphoma Identified  . Portacath placement Right 06-23-13  . Cholecystectomy      laparoscopic  . Bladder suspension    . Ureteral stent placement  07/14/2013    Family History  Problem Relation Age of Onset  . Stroke Mother   . Stroke Father   . Aneurysm Son     Brain  .  Cancer Sister      1 sister had vaginal cancer  . Cancer Brother     Prostate   History  Substance Use Topics  . Smoking status: Former Smoker -- 1.50 packs/day for 30 years    Types: Cigarettes    Quit date: 03/14/2011  . Smokeless tobacco: Never Used  . Alcohol Use: No   OB History   Grav Para Term Preterm Abortions TAB SAB Ect Mult Living                 Review of Systems  Unable to perform ROS: Mental status change      Allergies  Codeine and Penicillins  Home Medications   Prior to Admission medications   Medication Sig Start Date End Date Taking? Authorizing Provider  albuterol (PROVENTIL) (2.5 MG/3ML) 0.083% nebulizer solution Take 3 mLs (2.5 mg total) by nebulization every 6 (six) hours as needed for shortness of breath. 04/14/13  Yes Melissa R Smith, PA-C   allopurinol (ZYLOPRIM) 100 MG tablet Take 2 tablets (200 mg total) by mouth daily. 09/07/13  Yes Modena Jansky, MD  bisacodyl (DULCOLAX) 5 MG EC tablet Take 5 mg by mouth at bedtime.   Yes Historical Provider, MD  calcitonin, salmon, (MIACALCIN) 200 UNIT/ACT nasal spray Place 1 spray into alternate nostrils daily. 09/07/13  Yes Modena Jansky, MD  citalopram (CELEXA) 20 MG tablet Take 20 mg by mouth daily.   Yes Historical Provider, MD  dexamethasone (DECADRON) 2 MG tablet Take 1 tablet (2 mg total) by mouth 2 (two) times daily with a meal. 08/29/13  Yes Renato Shin, MD  diltiazem (DILACOR XR) 180 MG 24 hr capsule Take 180 mg by mouth daily.   Yes Historical Provider, MD  furosemide (LASIX) 20 MG tablet Take 20 mg by mouth daily. For 10 days starting 6/23   Yes Historical Provider, MD  insulin aspart (NOVOLOG) 100 UNIT/ML injection Inject 0-9 Units into the skin 3 (three) times daily with meals. CBG < 70: implement hypoglycemia protocol CBG 70 - 120: 0 units CBG 121 - 150: 1 unit CBG 151 - 200: 2 units CBG 201 - 250: 3 units CBG 251 - 300: 5 units CBG 301 - 350: 7 units CBG 351 - 400: 9 units CBG > 400: call MD. 09/07/13  Yes Modena Jansky, MD  levothyroxine (SYNTHROID, LEVOTHROID) 50 MCG tablet Take 50 mcg by mouth daily before breakfast.   Yes Historical Provider, MD  lidocaine-prilocaine (EMLA) cream Apply 1 application topically as needed. Apply to port a cath site one hour before needle stick as needed. 06/12/13  Yes Concha Norway, MD  magnesium oxide (MAG-OX) 400 (241.3 MG) MG tablet Take 0.5 tablets (200 mg total) by mouth daily. 02/21/13  Yes Allie Bossier, MD  ondansetron (ZOFRAN) 8 MG tablet Take 8 mg by mouth every 12 (twelve) hours as needed for nausea or vomiting.   Yes Historical Provider, MD  polyethylene glycol (MIRALAX / GLYCOLAX) packet Take 17 g by mouth 2 (two) times daily.   Yes Historical Provider, MD  pravastatin (PRAVACHOL) 40 MG tablet Take 40 mg by mouth at bedtime.    Yes Historical Provider, MD  tiotropium (SPIRIVA) 18 MCG inhalation capsule Place 1 capsule (18 mcg total) into inhaler and inhale daily. 08/22/13  Yes Concha Norway, MD  URELLE (URELLE/URISED) 81 MG TABS tablet Take 1 tablet (81 mg total) by mouth 3 (three) times daily as needed for bladder spasms. 07/03/13  Yes Modena Jansky, MD  warfarin (COUMADIN) 2 MG tablet Take 2.5 mg by mouth daily at 6 PM. 09/08/13  Yes Modena Jansky, MD   BP 100/61  Pulse 115  Temp(Src) 100.4 F (38 C) (Rectal)  Resp 22  Ht 4\' 11"  (1.499 m)  Wt 203 lb 3.2 oz (92.171 kg)  BMI 41.02 kg/m2  SpO2 95% Physical Exam  Nursing note and vitals reviewed. Constitutional: She appears well-developed and well-nourished. She appears ill. No distress.  HENT:  Head: Normocephalic and atraumatic.  Right Ear: External ear normal.  Left Ear: External ear normal.  Nose: Nose normal.  Mouth/Throat: Oropharynx is clear and moist.  Eyes: Conjunctivae are normal.  Neck: Normal range of motion.  Cardiovascular: Normal heart sounds, intact distal pulses and normal pulses.  An irregularly irregular rhythm present. Tachycardia present.   Bilateral pitting edema  Pulmonary/Chest: Effort normal and breath sounds normal. No stridor. No respiratory distress. She has no wheezes. She has no rales.  Abdominal: Soft. She exhibits no distension. There is generalized tenderness. There is no rigidity and no rebound.  Musculoskeletal: Normal range of motion.  Neurological: She is alert. She has normal strength.  Oriented to person  Skin: Skin is warm and dry. She is not diaphoretic. No erythema.  Psychiatric: She has a normal mood and affect. Her behavior is normal.    ED Course  Procedures (including critical care time) Labs Review Labs Reviewed  CBC WITH DIFFERENTIAL - Abnormal; Notable for the following:    RBC 3.51 (*)    Hemoglobin 10.4 (*)    HCT 31.1 (*)    RDW 20.0 (*)    Neutrophils Relative % 91 (*)    Lymphocytes  Relative 3 (*)    Lymphs Abs 0.2 (*)    All other components within normal limits  COMPREHENSIVE METABOLIC PANEL - Abnormal; Notable for the following:    Sodium 129 (*)    Chloride 89 (*)    Glucose, Bld 327 (*)    BUN 71 (*)    Creatinine, Ser 2.43 (*)    Albumin 2.1 (*)    ALT 41 (*)    GFR calc non Af Amer 19 (*)    GFR calc Af Amer 22 (*)    All other components within normal limits  URINALYSIS, ROUTINE W REFLEX MICROSCOPIC - Abnormal; Notable for the following:    APPearance CLOUDY (*)    Glucose, UA 250 (*)    Hgb urine dipstick LARGE (*)    Protein, ur 100 (*)    Leukocytes, UA MODERATE (*)    All other components within normal limits  PROTIME-INR - Abnormal; Notable for the following:    Prothrombin Time 30.0 (*)    INR 2.86 (*)    All other components within normal limits  PRO B NATRIURETIC PEPTIDE - Abnormal; Notable for the following:    Pro B Natriuretic peptide (BNP) 8619.0 (*)    All other components within normal limits  URINE MICROSCOPIC-ADD ON - Abnormal; Notable for the following:    Squamous Epithelial / LPF FEW (*)    Bacteria, UA MANY (*)    All other components within normal limits  CULTURE, BLOOD (ROUTINE X 2)  CULTURE, BLOOD (ROUTINE X 2)  URINE CULTURE  LIPASE, BLOOD  TROPONIN I  I-STAT CG4 LACTIC ACID, ED    Imaging Review Dg Chest Port 1 View  10/06/2013   CLINICAL DATA:  Fever  EXAM: PORTABLE CHEST - 1 VIEW  COMPARISON:  09/07/2013  FINDINGS: Do  lead pacemaker appears unchanged. Right-sided power port has its tip in the SVC 2 cm above the right atrium, unchanged. Heart size and mediastinal shadows are normal. The lungs remain clear. No effusions.  IMPRESSION: No active disease.   Electronically Signed   By: Nelson Chimes M.D.   On: 10/06/2013 07:28     EKG Interpretation None     8:56 AM Discussed case with Dr. Junious Silk. Patient was scheduled for routine stent change today. He will see her during admission and change stent either this  weekend or early next week.   MDM   Final diagnoses:  UTI (lower urinary tract infection)  AKI (acute kidney injury)  Sepsis, due to unspecified organism    Patient presents to ED with worsening mental status, fever, and worsening edema. Patient was initially hypotensive at 90/53. Patient found to have UTI. Likely urosepsis. Rocephin and vancomycin started in ED. Patient's BP is responding well to fluids. Informed Dr. Junious Silk who was scheduled to perform routine stent change today of patient's condition. He will consult during hospital stay. Patient is hemodynamically stable at this time. Patient admitted to medicine for further management. Admission is appreciated. Dr. Thurnell Garbe evaluated patient and agrees with plan. Patient / Family / Caregiver informed of clinical course, understand medical decision-making process, and agree with plan.      Elwyn Lade, PA-C 10/06/13 [phone]

## 2013-10-06 NOTE — Progress Notes (Signed)
ANTICOAGULATION CONSULT NOTE - Initial Consult  Pharmacy Consult for Warfarin Indication: atrial fibrillation  Allergies  Allergen Reactions  . Codeine Nausea Only  . Penicillins Other (See Comments)    Unknown    Patient Measurements: Height: 4\' 11"  (149.9 cm) Weight: 211 lb 13.8 oz (96.1 kg) IBW/kg (Calculated) : 43.2  Vital Signs: Temp: 100 F (37.8 C) (06/26 1349) Temp src: Core (Comment) (06/26 1200) BP: 107/57 mmHg (06/26 1349) Pulse Rate: 141 (06/26 1349)  Labs:  Recent Labs  10/06/13 0658 10/06/13 1017  HGB 10.4* 9.7*  HCT 31.1* 29.1*  PLT PLATELET CLUMPS NOTED ON SMEAR, UNABLE TO ESTIMATE 24*  APTT  --  49*  LABPROT 30.0* 30.4*  INR 2.86* 2.91*  CREATININE 2.43* 2.56*  TROPONINI <0.30  --     Estimated Creatinine Clearance: 20.2 ml/min (by C-G formula based on Cr of 2.56).   Medical History: Past Medical History  Diagnosis Date  . Sick sinus syndrome   . Hematoma     At the site of the pacemaker insertion.  . Hypotension   . Dyslipidemia   . History of atrial fibrillation   . Arthritis     Osteoarthritis  . Port-a-cath in place 12/08/10  . Status post chemotherapy completed 03/2011    R - CHOP q 3 weeks x 6  . S/P radiation therapy 05/28/2011 - 06/26/2011    Right Abdomen and Right Pelvis/3060 cGy in 17 Fractions  . Hypertension   . ASHD (arteriosclerotic heart disease)   . Gout   . COPD (chronic obstructive pulmonary disease)   . Asthma   . Hyperlipidemia   . Diabetes mellitus   . Hypothyroidism   . Diffuse large B cell lymphoma     Dr. Chisom,oncology  . Cancer 10/2010    large B cell lymphoma  . 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 Pacemaker     left chest  . Dysrhythmia     Hx. A. Fib, sick sinus syndrome- Pacemaker inserted.  . Cataracts, bilateral   . Sepsis     08/2013   . Urinary tract infection 08/2013, 10/06/13  . Pancytopenia due to chemotherapy   . Acute renal failure     CKD-  Stage III  . Acute encephalopathy     hx of   . Neutropenia   . Enterococcus UTI 08/2013     Medications:  Scheduled:  . allopurinol  200 mg Oral Daily  . bisacodyl  5 mg Oral QHS  . calcitonin (salmon)  1 spray Alternating Nares Daily  . dexamethasone  2 mg Oral BID WC  . insulin aspart  0-15 Units Subcutaneous TID WC  . insulin aspart  0-5 Units Subcutaneous QHS  . [START ON 10/07/2013] levothyroxine  50 mcg Oral QAC breakfast  . magnesium oxide  200 mg Oral Daily  . piperacillin-tazobactam (ZOSYN)  IV  3.375 g Intravenous Q8H  . simvastatin  20 mg Oral q1800  . sodium chloride  3 mL Intravenous Q12H  . tiotropium  18 mcg Inhalation Daily  . vancomycin  1,000 mg Intravenous Q24H  . warfarin  2.5 mg Oral q1800  . Warfarin - Physician Dosing Inpatient   Does not apply q1800   Infusions:  . sodium chloride 50 mL/hr at 10/06/13 1115   PRN: acetaminophen, acetaminophen, albuterol, ipratropium, ondansetron (ZOFRAN) IV, ondansetron, URELLE  Assessment: 73 yo female on chronic warfarin for hx afib. Pharmacy consulted to continue on admission.  Dose 2.5mg  daily  per [facility] MAR - last dose 6/25  INR at upper end of goal today (2.86)  Platelets 24k, per Dr. Boyce Medici note, recommend holding warfarin while <50k because of high bleeding risk.  No significant drug interactions noted, broad spectrum antibiotics may increase sensitivity to warfarin  Goal of Therapy:  INR 2-3 Monitor platelets by anticoagulation protocol: Yes   Plan:   Hold warfarin tonight per Oncologist recommendations given thrombocytopenia  Daily PT/INR  Peggyann Juba, PharmD, BCPS Pager: [phone] 10/06/2013,1:56 PM

## 2013-10-06 NOTE — ED Notes (Addendum)
Report given Loma Sousa, rn, the bed will be ready in about 10 minutes, they are doing a stat clean on the room

## 2013-10-06 NOTE — Progress Notes (Signed)
S:  Called by RN regarding fever, tachycardia, rigors and worsening mental status.   O:    Filed Vitals:   10/06/13 1415  BP: 77/32  Pulse: 173  Temp: 100.2 F (37.9 C)  Resp: 24    Exam: General: elderly female, shaking covered in blankets  Neuro: mildly lethargic, arouses, remains confused  CV: s1s2 tachy PULM: resp's shallow, mild tahypnea Extremities: warm/dry   A:  Septic Shock  UTI  Hx R Hydro s/p Stent Placement  Acute Encephalopathy  P:  STAT renal US to assess for hydro.  If present, may require perc drain Assess EKG, troponin, and lactate Urology planning for stent exchanged in am 6/27.   Neo for MAP > 65, concern she may not tolerate much more volume given exam and renal failure 1 L NS now  Additional CC time: 30 minutes   Noe Gens, NP-C [facility] Pgr: [phone] or [phone]

## 2013-10-06 NOTE — Consult Note (Signed)
PULMONARY / CRITICAL CARE MEDICINE   Name: [name] MRN: [id number] DOB: 24-Oct-1940    ADMISSION DATE:  10/06/2013 CONSULTATION DATE:  10/06/13  REFERRING MD :  Dr. Charlies Silvers  PRIMARY SERVICE: TRH  CHIEF COMPLAINT:  Urosepsis   BRIEF PATIENT DESCRIPTION: 73 y/o F, SNF Resident,  with PMH of HTN, HLD, recurrent lymphoma, COPD, CKD, R ureteral stent placement (4/3) and recent admission for Enterococcus UTI (5/20-5/28) who was admitted on 6/26 with recurrent sepsis / UTI.  PCCM consulted for evaluation.    SIGNIFICANT EVENTS: 10/2010 - Dx with NHL 04/2011 - Chemo R CHOP + XRT with good response 10/2012 - Recurrence, Rx with salvage chemo 05/2013 - Recurrence, CT A/P >> increased LAN with severe R hydro 06/2013 - Admission for sepsis, pyelo 07/14/13 - R ureteral stent placement  5/20-5/28 - Admission for Enterococcus UTI / Sepsis. AKI/CKD- multi-factorial with R-sided hydro, nephrotoxic agents, urosepsis, hypercalcemia, and volume depletion related to diarrhea as well as nephrotoxic agents. Improved with IVF's. Creatinine as high as 7.6 down to 2.58 on d/c. She underwent renal ultrasound which showed right stent in place, no hydronephrosis on the right or the left. ...................................................................................................................................................... 6/26 - Readmit with c/o fever, increased swelling, periods of hypoglycemia.  UTI +, Sepsis   STUDIES:    LINES / TUBES: R CW Port >>>  CULTURES: Hx Enterococcus UTI 5/20>> sens Vanco, nitrofurantioin BCx2 6/26 >> UC 6/26 >>  ANTIBIOTICS: Rocephin 6/26 >>>x1 Zosyn 6/26 >>> Vanco 6/26 >>>  HISTORY OF PRESENT ILLNESS:  73 y/o F, SNF Resident,  with PMH of HTN, HLD, recurrent lymphoma, COPD, CKD, R ureteral stent placement (4/3) and recent admission for Enterococcus UTI (5/20-5/28) who was admitted on 6/26 with recurrent sepsis / UTI.  Patient was noted at SNF to have increased  swelling in LE's & face with temp of 99.7.  Recent labs at SNF with elevated BUN.  Family has also noted increased confusion.    ER evaluation noted urine positive for UTI, mild hypotension.  PCCM consulted for evaluation of sepsis / hypotension.    PAST MEDICAL HISTORY :  Past Medical History  Diagnosis Date  . Sick sinus syndrome   . Hematoma     At the site of the pacemaker insertion.  . Hypotension   . Dyslipidemia   . History of atrial fibrillation   . Arthritis     Osteoarthritis  . Port-a-cath in place 12/08/10  . Status post chemotherapy completed 03/2011    R - CHOP q 3 weeks x 6  . S/P radiation therapy 05/28/2011 - 06/26/2011    Right Abdomen and Right Pelvis/3060 cGy in 17 Fractions  . Hypertension   . ASHD (arteriosclerotic heart disease)   . Gout   . COPD (chronic obstructive pulmonary disease)   . Asthma   . Hyperlipidemia   . Diabetes mellitus   . Hypothyroidism   . Diffuse large B cell lymphoma     Dr. Chisom,oncology  . Cancer 10/2010    large B cell lymphoma  . 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 Pacemaker     left chest  . Dysrhythmia     Hx. A. Fib, sick sinus syndrome- Pacemaker inserted.  . Cataracts, bilateral   . Sepsis     08/2013   . Urinary tract infection 08/2013   . Pancytopenia due to chemotherapy   . Acute renal failure     CKD- Stage III  . Acute encephalopathy  hx of   . Neutropenia   . Enterococcus UTI 08/2013    Past Surgical History  Procedure Laterality Date  . Pacemaker insertion      Lead revision completed August 04, 2006- left chest G. Lovena Le  . Tubal ligation      Bilateral  . Insert / replace / remove pacemaker    . Abdominal hysterectomy    . Biopsy stomach  11/03/10    Soft Tissue Mass, Biopsy, Right Lower Quadrant Mesenteric Mass- High Grade Non-Hodgkins B Cell Lymphoma  with Flow Cytometry  . Bone biopsy  11/24/10    Bone Marrow, Aspirate Biospy, and clot, Left - No  Involvement of Non-Hodgkin's Lymphoma Identified  . Portacath placement Right 06-23-13  . Cholecystectomy      laparoscopic  . Bladder suspension    . Ureteral stent placement  07/14/2013    Prior to Admission medications   Medication Sig Start Date End Date Taking? Authorizing Lazer Wollard  albuterol (PROVENTIL) (2.5 MG/3ML) 0.083% nebulizer solution Take 3 mLs (2.5 mg total) by nebulization every 6 (six) hours as needed for shortness of breath. 04/14/13  Yes Melissa R Smith, PA-C  allopurinol (ZYLOPRIM) 100 MG tablet Take 2 tablets (200 mg total) by mouth daily. 09/07/13  Yes Modena Jansky, MD  bisacodyl (DULCOLAX) 5 MG EC tablet Take 5 mg by mouth at bedtime.   Yes Historical Dhamar Gregory, MD  calcitonin, salmon, (MIACALCIN) 200 UNIT/ACT nasal spray Place 1 spray into alternate nostrils daily. 09/07/13  Yes Modena Jansky, MD  citalopram (CELEXA) 20 MG tablet Take 20 mg by mouth daily.   Yes Historical Kellin Bartling, MD  dexamethasone (DECADRON) 2 MG tablet Take 1 tablet (2 mg total) by mouth 2 (two) times daily with a meal. 08/29/13  Yes Renato Shin, MD  diltiazem (DILACOR XR) 180 MG 24 hr capsule Take 180 mg by mouth daily.   Yes Historical Jhade Berko, MD  furosemide (LASIX) 20 MG tablet Take 20 mg by mouth daily. For 10 days starting 6/23   Yes Historical Elberta Lachapelle, MD  insulin aspart (NOVOLOG) 100 UNIT/ML injection Inject 0-9 Units into the skin 3 (three) times daily with meals. CBG < 70: implement hypoglycemia protocol CBG 70 - 120: 0 units CBG 121 - 150: 1 unit CBG 151 - 200: 2 units CBG 201 - 250: 3 units CBG 251 - 300: 5 units CBG 301 - 350: 7 units CBG 351 - 400: 9 units CBG > 400: call MD. 09/07/13  Yes Modena Jansky, MD  levothyroxine (SYNTHROID, LEVOTHROID) 50 MCG tablet Take 50 mcg by mouth daily before breakfast.   Yes Historical Rodric Punch, MD  lidocaine-prilocaine (EMLA) cream Apply 1 application topically as needed. Apply to port a cath site one hour before needle stick as needed. 06/12/13   Yes Concha Norway, MD  magnesium oxide (MAG-OX) 400 (241.3 MG) MG tablet Take 0.5 tablets (200 mg total) by mouth daily. 02/21/13  Yes Allie Bossier, MD  ondansetron (ZOFRAN) 8 MG tablet Take 8 mg by mouth every 12 (twelve) hours as needed for nausea or vomiting.   Yes Historical Rayland Hamed, MD  polyethylene glycol (MIRALAX / GLYCOLAX) packet Take 17 g by mouth 2 (two) times daily.   Yes Historical Sparkle Aube, MD  pravastatin (PRAVACHOL) 40 MG tablet Take 40 mg by mouth at bedtime.   Yes Historical Kairi Tufo, MD  tiotropium (SPIRIVA) 18 MCG inhalation capsule Place 1 capsule (18 mcg total) into inhaler and inhale daily. 08/22/13  Yes Concha Norway, MD  Theda Belfast (  URELLE/URISED) 81 MG TABS tablet Take 1 tablet (81 mg total) by mouth 3 (three) times daily as needed for bladder spasms. 07/03/13  Yes Modena Jansky, MD  warfarin (COUMADIN) 2 MG tablet Take 2.5 mg by mouth daily at 6 PM. 09/08/13  Yes Modena Jansky, MD   Allergies  Allergen Reactions  . Codeine Nausea Only  . Penicillins Other (See Comments)    Unknown    FAMILY HISTORY:  Family History  Problem Relation Age of Onset  . Stroke Mother   . Stroke Father   . Aneurysm Son     Brain  . Cancer Sister      1 sister had vaginal cancer  . Cancer Brother     Prostate   SOCIAL HISTORY:  reports that she quit smoking about 2 years ago. Her smoking use included Cigarettes. She has a 45 pack-year smoking history. She has never used smokeless tobacco. She reports that she does not drink alcohol or use illicit drugs.  REVIEW OF SYSTEMS:  Unable to complete as patient is confused.   SUBJECTIVE:   VITAL SIGNS: Temp:  [99.7 F (37.6 C)-100.4 F (38 C)] 100.4 F (38 C) (06/26 0745) Pulse Rate:  [78-124] 78 (06/26 1010) Resp:  [16-23] 23 (06/26 1010) BP: (90-107)/(52-84) 107/84 mmHg (06/26 1010) SpO2:  [92 %-96 %] 95 % (06/26 1010) Weight:  [203 lb 3.2 oz (92.171 kg)] 203 lb 3.2 oz (92.171 kg) (06/26 0900)  HEMODYNAMICS:    VENTILATOR  SETTINGS:    INTAKE / OUTPUT: Intake/Output     06/25 0701 - 06/26 0700 06/26 0701 - 06/27 0700   I.V. (mL/kg)  500 (5.4)   Total Intake(mL/kg)  500 (5.4)   Net   +500          PHYSICAL EXAMINATION: General:  Morbidly obese in NAD Neuro:  Awake, alert, MAE, oriented to self but confused otherwise HEENT:  Mm pink/dry, no jvd Cardiovascular:  s1s2 rrr, no m/r/g Lungs:  resp's even/non-labored, lungs bilaterally clear Abdomen:  Obese, soft, bsx4 active Musculoskeletal:  No acute deformities , peripheral pulses + Skin:  Warm/dry, 2+ pitting edema in LE, good cap refill  LABS:  CBC  Recent Labs Lab 10/06/13 0658  WBC 7.2  HGB 10.4*  HCT 31.1*  PLT PLATELET CLUMPS NOTED ON SMEAR, UNABLE TO ESTIMATE   Coag's  Recent Labs Lab 10/06/13 0658  INR 2.86*   BMET  Recent Labs Lab 10/06/13 0658  NA 129*  K 4.8  CL 89*  CO2 21  BUN 71*  CREATININE 2.43*  GLUCOSE 327*   Electrolytes  Recent Labs Lab 10/06/13 0658  CALCIUM 9.3   Sepsis Markers  Recent Labs Lab 10/06/13 0713  LATICACIDVEN 1.83   ABG No results found for this basename: PHART, PCO2ART, PO2ART,  in the last 168 hours Liver Enzymes  Recent Labs Lab 10/06/13 0658  AST 18  ALT 41*  ALKPHOS 107  BILITOT 0.4  ALBUMIN 2.1*   Cardiac Enzymes  Recent Labs Lab 10/06/13 0657 10/06/13 0658  TROPONINI  --  <0.30  PROBNP 8619.0*  --    Glucose No results found for this basename: GLUCAP,  in the last 168 hours  Imaging Dg Chest Port 1 View  10/06/2013   CLINICAL DATA:  Fever  EXAM: PORTABLE CHEST - 1 VIEW  COMPARISON:  09/07/2013  FINDINGS: Do lead pacemaker appears unchanged. Right-sided power port has its tip in the SVC 2 cm above the right atrium, unchanged. Heart size  and mediastinal shadows are normal. The lungs remain clear. No effusions.  IMPRESSION: No active disease.   Electronically Signed   By: Nelson Chimes M.D.   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  Neg lactate reassuring.   P:   ABX / Cultures as above Recommend Urology consult / input regarding stent  Trend PCT   ENDOCRINE A:   Diabetes Mellitus Hypothyroidism Gout P:   SSI  Continue synthroid Consider hold allopurinol   NEUROLOGIC A:   Acute Encephalopathy  P:   RASS goal: n/a Supportive care, promote sleep / wake cycle Minimize sedating medications Mobilize as able   Noe Gens, NP-C [facility] Pgr: [phone] or [phone]  Summary - Sepsis, no evidence of organ dysfunction, lactate 1.8, appears fluid overloaded already If further BP drop would rpt lactate & use pressors via portocath as needed  I have personally obtained a history, examined the patient, evaluated laboratory and imaging results,  formulated the assessment and plan and placed orders.  Kara Mead MD. Shade Flood. [facility] Pager [phone] If no response call [phone]     10/06/2013, 10:42 AM

## 2013-10-06 NOTE — Progress Notes (Signed)
CRITICAL VALUE ALERT  Critical value received:  Platelets 24  Date of notification:  10/06/13  Time of notification:  1310  Critical value read back:Yes.    Nurse who received alert:  [email or web address]  MD notified (1st page): dr. [name] at bedside  Time of first page:   MD notified (2nd page):  Time of second page:  Responding MD:    Time MD responded:

## 2013-10-06 NOTE — Progress Notes (Signed)
Patient ID: [name], female   DOB: [birth year], 73 y.o.   MRN: [id number]   I reviewed the renal ultrasound which shows no hydronephrosis. The shadowing focus in the right kidney is likely the right stent coil.  This is reassuring the stent is in proper position and functioning. I would not recommend a percutaneous nephrostomy tube given there is no sign of hydronephrosis. Hopefully the patient will stabilize and we can get this stent changed in the coming days.

## 2013-10-06 NOTE — H&P (Signed)
[facility] History and Physical  [name] PNT:[id number] DOB: 1940/12/19 DOA: 10/06/2013  Referring physician: ER physician PCP: [name], MD   Chief Complaint: Altered mental status  HPI:  73 year old female with past medical history of diffuse B-cell lymphoma (under the care of Dr. [name]), on chemotherapy with Revlimid and Rituxan, history of atrial fibrillation on Coumadin, sick sinus syndrome, status post pacemaker placement, recent admission in May 2015 for acute on chronic kidney disease and right hydronephrosis who presented from skilled nursing facility with reports of altered mental status, lethargy for the past 24 hours prior to this admission. In addition, patient was found to have a low grade fever. Patient is not a good historian due to altered mental status but her family at the bedside is able to provide some details of history of present illness. Patient's family also reported that her blood sugars were and 500 range in skilled nursing facility. Family spoke to the patient over the phone 2 days prior to this admission and the patient reported not feeling quite well. No respiratory distress. No vomiting. In ED, blood pressure was 90/53, heart rate 124, RR 16-23, T max 100.4 F and oxygen saturation 96% on room air. Chest x-ray did not show acute cardiopulmonary process. Urinalysis showed moderate leukocytes and many bacteria. Lactic acid was 1.83. She was started on empiric Rocephin for treatment of UTI. Additional blood work included BNP which was elevated at 8619. Creatinine was 2.43, sodium 129, hemoglobin 10.4. Patient's family at the bedside confirmed partial CODE STATUS. Patient admitted to step down unit for further management. ED medications: Bolus NS 500 cc and then maintenance NS @ 100 cc/hr; Rocephin and vancomycin; Tylenol   Assessment & Plan    Principal problem: Sepsis secondary to urinary tract infection  Criteria for sepsis met considering  blood pressure of 90/53. She was given bolus of 500 cc normal saline and maintenance of normal saline at 100 cc an hour. Blood pressure is 91/54 at present. HR 124, RR 23, T max 100.4 F and evidence of infection on urinalysis. Patient was started on broad-spectrum antibiotics, zosyn and vancomycin.  Followup urine culture results, blood culture results. Chest x-ray did not show acute cardiopulmonary process  Critical care medicine consulted in case patient requires pressor support. The presence of the edema poses a management conundrum with regards to fluid management as clinically, pt appears to be volume depleted but the presence of pedal edema precludes aggressive intravenous fluids. IV fluids are now at 50 cc an hour.  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   Management per oncology. We will let now Dr. [name] of patient's admission.  Last chemotherapy given 09/15/2013, Rituxan. A Revlimid on hold due to renal insufficiency Acute of Chronic Renal Failure, improving.   Patient had recent admission for urosepsis with creatinine of 7.7 on 05/20. It continues to decrease. creatinine is 2.43 on this admission.  S/P R. Hydronephrosis, s/p R ureteral stent (04/03)   Patient had R ureteral stent placed on 04/03. She was scheduled for exchange on 06/22 but this is on hold until infection resolved. Anemia of chronic disease  Secondary to history of B-cell lymphoma as well as sequela of chemotherapy  Hemoglobin is 10.4 and this admission. No current indication for transfusion Hypothyroidism  Continue levothyroxine History of atrial fibrillation  Continue Coumadin, dosing per pharmacy. Cardizem health due to low blood pressure.  DVT prophylaxis: 1. Anticoagulation with Coumadin  Radiological Exams on Admission: Dg Chest Port 1 View  10/06/2013    CLINICAL DATA:  Fever  EXAM: PORTABLE CHEST - 1 VIEW  COMPARISON:  09/07/2013  FINDINGS: Do lead pacemaker appears unchanged. Right-sided power port has its tip in the SVC 2 cm above the right atrium, unchanged. Heart size and mediastinal shadows are normal. The lungs remain clear. No effusions.  IMPRESSION: No active disease.   Electronically Signed   By: Nelson Chimes M.D.   On: 10/06/2013 07:28    EKG: Atrial fibrillation  Code Status: Full Family Communication: Plan of care discussed with the patient  Disposition Plan: Admit for further evaluation  [name], MD  [facility] Pager [phone]  Review of Systems:  Constitutional: Positive for fever, no chills and malaise/fatigue. Negative for diaphoresis.  HENT: Negative for hearing loss, ear pain, nosebleeds, congestion, sore throat, neck pain, tinnitus and ear discharge.   Eyes: Negative for blurred vision, double vision, photophobia, pain, discharge and redness.  Respiratory: Negative for cough, hemoptysis, sputum production, shortness of breath, wheezing and stridor.   Cardiovascular: Negative for chest pain, palpitations, orthopnea, claudication Gastrointestinal: Negative for nausea, vomiting and abdominal pain. Negative for heartburn, constipation, blood in stool and melena.  Genitourinary: Negative for dysuria, urgency, frequency, hematuria and flank pain.  Musculoskeletal: Negative for myalgias, back pain, joint pain and falls.  Skin: Negative for itching and rash.  Neurological: Negative for dizziness and weakness. Negative for tingling, tremors, sensory change, speech change, focal weakness, loss of consciousness and headaches.  Endo/Heme/Allergies: Negative for environmental allergies and polydipsia. Does not bruise/bleed easily.  Psychiatric/Behavioral: Negative for suicidal ideas. The patient is not nervous/anxious.      Past Medical History  Diagnosis Date  . Sick sinus syndrome   . Hematoma     At the site of  the pacemaker insertion.  . Hypotension   . Dyslipidemia   . History of atrial fibrillation   . Arthritis     Osteoarthritis  . Port-a-cath in place 12/08/10  . Status post chemotherapy completed 03/2011    R - CHOP q 3 weeks x 6  . S/P radiation therapy 05/28/2011 - 06/26/2011    Right Abdomen and Right Pelvis/3060 cGy in 17 Fractions  . Hypertension   . ASHD (arteriosclerotic heart disease)   . Gout   . COPD (chronic obstructive pulmonary disease)   . Asthma   . Hyperlipidemia   . Diabetes mellitus   . Hypothyroidism   . Diffuse large B cell lymphoma     Dr. Chisom,oncology  . Cancer 10/2010    large B cell lymphoma  . 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 Pacemaker     left chest  . Dysrhythmia     Hx. A. Fib, sick sinus syndrome- Pacemaker inserted.  . Cataracts, bilateral   . Sepsis     08/2013   . Urinary tract infection 08/2013   . Pancytopenia due to chemotherapy   . Acute renal failure     CKD- Stage III  . Acute encephalopathy     hx of   . Neutropenia   . Enterococcus UTI 08/2013    Past Surgical History  Procedure Laterality Date  . Pacemaker insertion      Lead revision completed August 04, 2006- left chest G. Lovena Le  . Tubal ligation      Bilateral  . Insert / replace / remove pacemaker    .  Abdominal hysterectomy    . Biopsy stomach  11/03/10    Soft Tissue Mass, Biopsy, Right Lower Quadrant Mesenteric Mass- High Grade Non-Hodgkins B Cell Lymphoma  with Flow Cytometry  . Bone biopsy  11/24/10    Bone Marrow, Aspirate Biospy, and clot, Left - No Involvement of Non-Hodgkin's Lymphoma Identified  . Portacath placement Right 06-23-13  . Cholecystectomy      laparoscopic  . Bladder suspension    . Ureteral stent placement  07/14/2013    Social History:  reports that she quit smoking about 2 years ago. Her smoking use included Cigarettes. She has a 45 pack-year smoking history. She has never used smokeless tobacco. She  reports that she does not drink alcohol or use illicit drugs.  Allergies  Allergen Reactions  . Codeine Nausea Only  . Penicillins Other (See Comments)    Unknown    Family History:  Family History  Problem Relation Age of Onset  . Stroke Mother   . Stroke Father   . Aneurysm Son     Brain  . Cancer Sister      1 sister had vaginal cancer  . Cancer Brother     Prostate     Prior to Admission medications   Medication Sig Start Date End Date Taking? Authorizing Provider  albuterol (PROVENTIL) (2.5 MG/3ML) 0.083% nebulizer solution Take 3 mLs (2.5 mg total) by nebulization every 6 (six) hours as needed for shortness of breath. 04/14/13  Yes Melissa R Smith, PA-C  allopurinol (ZYLOPRIM) 100 MG tablet Take 2 tablets (200 mg total) by mouth daily. 09/07/13  Yes Elease Etienne, MD  bisacodyl (DULCOLAX) 5 MG EC tablet Take 5 mg by mouth at bedtime.   Yes Historical Provider, MD  calcitonin, salmon, (MIACALCIN) 200 UNIT/ACT nasal spray Place 1 spray into alternate nostrils daily. 09/07/13  Yes Elease Etienne, MD  citalopram (CELEXA) 20 MG tablet Take 20 mg by mouth daily.   Yes Historical Provider, MD  dexamethasone (DECADRON) 2 MG tablet Take 1 tablet (2 mg total) by mouth 2 (two) times daily with a meal. 08/29/13  Yes Romero Belling, MD  diltiazem (DILACOR XR) 180 MG 24 hr capsule Take 180 mg by mouth daily.   Yes Historical Provider, MD  furosemide (LASIX) 20 MG tablet Take 20 mg by mouth daily. For 10 days starting 6/23   Yes Historical Provider, MD  insulin aspart (NOVOLOG) 100 UNIT/ML injection Inject 0-9 Units into the skin 3 (three) times daily with meals. CBG < 70: implement hypoglycemia protocol CBG 70 - 120: 0 units CBG 121 - 150: 1 unit CBG 151 - 200: 2 units CBG 201 - 250: 3 units CBG 251 - 300: 5 units CBG 301 - 350: 7 units CBG 351 - 400: 9 units CBG > 400: call MD. 09/07/13  Yes Elease Etienne, MD  levothyroxine (SYNTHROID, LEVOTHROID) 50 MCG tablet Take 50 mcg by mouth  daily before breakfast.   Yes Historical Provider, MD  lidocaine-prilocaine (EMLA) cream Apply 1 application topically as needed. Apply to port a cath site one hour before needle stick as needed. 06/12/13  Yes Myra Rude, MD  magnesium oxide (MAG-OX) 400 (241.3 MG) MG tablet Take 0.5 tablets (200 mg total) by mouth daily. 02/21/13  Yes Drema Dallas, MD  ondansetron (ZOFRAN) 8 MG tablet Take 8 mg by mouth every 12 (twelve) hours as needed for nausea or vomiting.   Yes Historical Provider, MD  polyethylene glycol (MIRALAX / GLYCOLAX) packet Take  17 g by mouth 2 (two) times daily.   Yes Historical Provider, MD  pravastatin (PRAVACHOL) 40 MG tablet Take 40 mg by mouth at bedtime.   Yes Historical Provider, MD  tiotropium (SPIRIVA) 18 MCG inhalation capsule Place 1 capsule (18 mcg total) into inhaler and inhale daily. 08/22/13  Yes Concha Norway, MD  URELLE (URELLE/URISED) 81 MG TABS tablet Take 1 tablet (81 mg total) by mouth 3 (three) times daily as needed for bladder spasms. 07/03/13  Yes Modena Jansky, MD  warfarin (COUMADIN) 2 MG tablet Take 2.5 mg by mouth daily at 6 PM. 09/08/13  Yes Modena Jansky, MD   Physical Exam: Filed Vitals:   10/06/13 0745 10/06/13 0759 10/06/13 0900 10/06/13 0915  BP:    100/61  Pulse:  111  115  Temp: 100.4 F (38 C)     TempSrc: Rectal     Resp:    22  Height:   4' 11"  (1.499 m)   Weight:   92.171 kg (203 lb 3.2 oz)   SpO2:  96%  95%    Physical Exam  Constitutional: Appears well-developed and well-nourished. No distress.  HENT: Normocephalic. No tonsillar erythema or exudates Eyes: Conjunctivae are pale. PERRLA, no scleral icterus.  Neck: Normal ROM. Neck supple. No JVD. No tracheal deviation. No thyromegaly.  CVS: Tachycardic, S1/S2 appreciated Pulmonary: Effort and breath sounds normal, no stridor, rhonchi, wheezes, rales.  Abdominal: Soft. BS +,  no distension, tenderness, rebound or guarding.  Musculoskeletal: Normal range of motion. +2 lower  extremity pitting edema.  Lymphadenopathy: No lymphadenopathy noted, cervical, inguinal. Neuro: Alert. Normal reflexes, muscle tone coordination. No focal neurologic deficits. Skin: Skin is warm and dry. No rash noted. Not diaphoretic.  Psychiatric: Normal mood and affect.   Labs on Admission:  Basic Metabolic Panel:  Recent Labs Lab 10/06/13 0658  NA 129*  K 4.8  CL 89*  CO2 21  GLUCOSE 327*  BUN 71*  CREATININE 2.43*  CALCIUM 9.3   Liver Function Tests:  Recent Labs Lab 10/06/13 0658  AST 18  ALT 41*  ALKPHOS 107  BILITOT 0.4  PROT 6.0  ALBUMIN 2.1*    Recent Labs Lab 10/06/13 0658  LIPASE 34   No results found for this basename: AMMONIA,  in the last 168 hours CBC:  Recent Labs Lab 10/06/13 0658  WBC 7.2  NEUTROABS 6.6  HGB 10.4*  HCT 31.1*  MCV 88.6  PLT PLATELET CLUMPS NOTED ON SMEAR, UNABLE TO ESTIMATE   Cardiac Enzymes:  Recent Labs Lab 10/06/13 0658  TROPONINI <0.30   BNP: No components found with this basename: POCBNP,  CBG: No results found for this basename: GLUCAP,  in the last 168 hours  If 7PM-7AM, please contact night-coverage [email or web address] Password [facility] 10/06/2013, 9:44 AM

## 2013-10-06 NOTE — Progress Notes (Signed)
[name]   DOB:[birth year]   VE#:[id number]   GEZ#:[id number]  Subjective:Patient niece [name] and her husband is at bedside. Nurse notes drop in plts but reports no actively bleeding. Patient is on zosyn and vancomycin.   Objective:  Filed Vitals:   10/06/13 1300  BP: 99/48  Pulse: 107  Temp: 99.1 F (37.3 C)  Resp: 23    Body mass index is 42.77 kg/(m^2).  Intake/Output Summary (Last 24 hours) at 10/06/13 1338 Last data filed at 10/06/13 1200  Gross per 24 hour  Intake  737.5 ml  Output      0 ml  Net  737.5 ml    Oriented to self, place and time;Shivering. Alert to self only.   Sclerae unicteric  Oropharynx clear  No peripheral adenopathy  Lungs clear -- no rales or rhonchi  Irregular Irregular S1 S2 without murmurs  Abdomen obese, NTTP +BS  MSK 2+ edema bilaterally  Neuro, moves all her extremities.     CBG (last 3)   Recent Labs  10/06/13 1213  GLUCAP 333*     Labs:  Lab Results  Component Value Date   WBC 6.2 10/06/2013   HGB 9.7* 10/06/2013   HCT 29.1* 10/06/2013   MCV 88.7 10/06/2013   PLT 24* 10/06/2013   NEUTROABS 5.8 6/50/3546    Basic Metabolic Panel:  Recent Labs Lab 10/06/13 0658 10/06/13 1017  NA 129* 132*  K 4.8 4.7  CL 89* 91*  CO2 21 23  GLUCOSE 327* 341*  BUN 71* 71*  CREATININE 2.43* 2.56*  CALCIUM 9.3 9.0  MG  --  2.0  PHOS  --  4.6   GFR Estimated Creatinine Clearance: 20.2 ml/min (by C-G formula based on Cr of 2.56). Liver Function Tests:  Recent Labs Lab 10/06/13 0658 10/06/13 1017  AST 18 16  ALT 41* 36*  ALKPHOS 107 109  BILITOT 0.4 0.2*  PROT 6.0 5.5*  ALBUMIN 2.1* 2.0*    Recent Labs Lab 10/06/13 0658  LIPASE 34   No results found for this basename: AMMONIA,  in the last 168 hours Coagulation profile  Recent Labs Lab 10/06/13 0658 10/06/13 1017  INR 2.86* 2.91*    CBC:  Recent Labs Lab 10/06/13 0658 10/06/13 1017  WBC 7.2 6.2  NEUTROABS 6.6 5.8  HGB 10.4* 9.7*  HCT 31.1* 29.1*  MCV  88.6 88.7  PLT PLATELET CLUMPS NOTED ON SMEAR, UNABLE TO ESTIMATE 24*   CBG:  Recent Labs Lab 10/06/13 1213  GLUCAP 333*   D-Dimer No results found for this basename: DDIMER,  in the last 72 hours Hgb A1c No results found for this basename: HGBA1C,  in the last 72 hours Microbiology No results found for this or any previous visit (from the past 240 hour(s)).  Results for [name] (MRN [id number]) as of 10/06/2013 13:41  Ref. Range 10/06/2013 07:55  Color, Urine Latest Range: YELLOW  YELLOW  APPearance Latest Range: CLEAR  CLOUDY (A)  Specific Gravity, Urine Latest Range: 1.005-1.030  1.012  pH Latest Range: 4.6-8.0  6.0  Glucose Latest Range: NEGATIVE mg/dL 250 (A)  Bilirubin Urine Latest Range: NEGATIVE  NEGATIVE  Ketones, ur Latest Range: NEGATIVE mg/dL NEGATIVE  Protein Latest Range: NEGATIVE mg/dL 100 (A)  Urobilinogen, UA Latest Range: 0.0-1.0 mg/dL 0.2  Nitrite Latest Range: NEGATIVE  NEGATIVE  Leukocytes, UA Latest Range: NEGATIVE  MODERATE (A)  Hgb urine dipstick Latest Range: NEGATIVE  LARGE (A)  Urine-Other No range found MUCOUS PRESENT  WBC,  UA Latest Range: <3 WBC/hpf 7-10  RBC / HPF Latest Range: <3 RBC/hpf 11-20  Squamous Epithelial / LPF Latest Range: RARE  FEW (A)  Bacteria, UA Latest Range: RARE  MANY (A)    Studies:  Dg Abd 1 View  10/06/2013   CLINICAL DATA:  Chronic renal failure. Right ureteral stent. History of lymphoma.  EXAM: ABDOMEN - 1 VIEW  COMPARISON:  CT 06/05/2013.  FINDINGS: Soft tissue structures are unremarkable. Gas pattern nonspecific. Double-J right ureteral stent is noted in good anatomic position. Catheter with tip projected over bladder noted. Degenerative changes lumbar spine and both hips.  IMPRESSION: 1. No acute abnormality. 2. Double-J right ureteral stent in good anatomic position.   Electronically Signed   By: Marcello Moores  Register   On: 10/06/2013 12:37   Dg Chest Port 1 View  10/06/2013   CLINICAL DATA:  Fever  EXAM: PORTABLE  CHEST - 1 VIEW  COMPARISON:  09/07/2013  FINDINGS: Do lead pacemaker appears unchanged. Right-sided power port has its tip in the SVC 2 cm above the right atrium, unchanged. Heart size and mediastinal shadows are normal. The lungs remain clear. No effusions.  IMPRESSION: No active disease.   Electronically Signed   By: Nelson Chimes M.D.   On: 10/06/2013 07:28    Assessment/Plan: 73 y.o.  1. AMS secondary to #2, #3, #4, resolving.  --She appears toxic.  Please continue antibiotics and fluids. Probably urine source. Prior urine cultures with enterococcus. Continue vancomycin and zosyn.    2. Acute of Chronic Renal Failure, resolving.  --Ultrasound negative for chronic appearing changes or hydronephrosis on prior admission.  Agree probable ATN secondary dehydration. Appreciate Renal. Creatinine slowly improving. 2.56 today.   3. Urosepsis w tachycardia and hypotension.  --Likely secondary to #5. Patient is not a candidate for bisphophonates secondary to #2. Hydration and low dose steroids.   4. 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  --HOLD further chemotherapy in the setting of above.  --Patient does not want intubation or chest compressions per prior discussion.   5. S/P R. Hydronephrosis, s/p R ureteral stent (04/03)  --Patient had R ureteral stent placed on 04/03. Planning to exchange per urology.   6. Thrombocytopenia.  -- Likely secondary to sepsis with DIC. Please check smear daily.  Transfuse for active bleeding or plts less than 10.  Other causes to consider would be lymphoma with bone marrow involvement versus revlimid.  --I would hold A/C when plts are less than 50 as her bleeding risk would be high.   7. Anemia secondary to #4 or chemotherapy.  --Please try maintain a hemoglobin greater than 8.   8. Hypothyroidism: Levothyroxine.  9. Hypertension:  Anti-hypertensives held due to hypotension.  10. Diabetes mellitus, type II: Worst with steroids.  Hoping to slowly taper off.  Unlikely benefiting her refractory lymphoma at this point.  Her hypercalcemia is resolved.  11. Hyperlipidemia: Pravastatin per PCP.  12.History of atrial fibrillation: Diltiazem, pacemaker. Coumadin being managed per her PCP previously. HOLD coumadin due to decrease in plts.  13. COPD/ Chronic Bronchitis exacerbation.  --Patient advised to continue albuterol q 6 hours as needed.  14. Disposition. Partial    Renda Pohlman, MD 10/06/2013  1:38 PM

## 2013-10-06 NOTE — ED Provider Notes (Signed)
Medical screening examination/treatment/procedure(s) were conducted as a shared visit with non-physician practitioner(s) and myself.  I personally evaluated the patient during the encounter. 72yo F, with c/o by NH her "BUN is high" and "fever." EMS states temp was "99.7" on their arrival to scene. Pt's POA (neice) states pt's bilat LE's have been getting progressively "more swollen" for the past 1 week. NH informed pt's POA that pt had CBG of "445" 1 week ago, tx with insulin and IVF. Pt herself currently denies any complaints. A&O, febrile, lungs coarse, resps easy, speaking full sentences, IRRR, abd soft/NT, +2 pedal edema bilat feet to knees. SBP 90's, c/w pt's previous per EPIC chart review. Judicious IVF given. APAP given for fever. +UTI, UC and BC pending; will dose IV abx. Admit.      Date: 10/06/2013  Rate: 126  Rhythm: atrial fibrillation  QRS Axis: normal  Intervals: normal  ST/T Wave abnormalities: nonspecific ST/T changes ant-lat leads  Conduction Disutrbances:right bundle branch block  Narrative Interpretation:   Old EKG Reviewed: changes noted; afib has replaced paced rhythm on previous EKG dated 09/05/2013.       Alfonzo Feller, DO 10/07/13 2132

## 2013-10-06 NOTE — Telephone Encounter (Signed)
THIS REFILL REQUEST FOR REVLIMID WAS GIVEN TO DR.CHISM'S NURSE, KATHY BUYCK,RN. 

## 2013-10-06 NOTE — Progress Notes (Signed)
[place] Progress Note Patient Name: [name] DOB: 1940-06-17 MRN: [id number]  Date of Service  10/06/2013   HPI/Events of Note   Hypotensive  eICU Interventions  Increase neo and check CVP   Intervention Category Major Interventions: Acid-Base disturbance - evaluation and management;Hypotension - evaluation and management  YACOUB,WESAM 10/06/2013, 6:23 PM

## 2013-10-06 NOTE — ED Notes (Addendum)
Pt arrived to the ED via EMS from [facility] with a complaint of abnormal BUN levels.  Pt was noted by Nursing at the facility to have some facial swelling and bilateral pitted edema to the legs after they began to administer IV NS fluids. Pt has a pacemaker and is running presently in AFIB for which the pt has a history. Pt was febrile for EMS and is running a temp of 99.7 presently upon arrival.  Pt is alert per baseline and has no complaints.  Pt is schedule to have a urological stent removed but the pt's coumadin has not been stopped.

## 2013-10-06 NOTE — Progress Notes (Signed)
ANTIBIOTIC CONSULT NOTE - INITIAL  Pharmacy Consult for:  Ceftriaxone and Vancomycin Indication:  Urosepsis  Allergies  Allergen Reactions  . Codeine Nausea Only  . Penicillins Other (See Comments)    Unknown    Patient Measurements: Height: 4\' 11"  (149.9 cm) (taken 09/15/13) Weight: 203 lb 3.2 oz (92.171 kg) (taken 09/15/13) IBW/kg (Calculated) : 43.2   Vital Signs: Temp: 100.4 F (38 C) (06/26 0745) Temp src: Rectal (06/26 0745) BP: 100/61 mmHg (06/26 0915) Pulse Rate: 115 (06/26 0915)  Labs:  Recent Labs  10/06/13 0658  WBC 7.2  HGB 10.4*  PLT PLATELET CLUMPS NOTED ON SMEAR, UNABLE TO ESTIMATE  CREATININE 2.43*   Estimated Creatinine Clearance: 20.7 ml/min (by C-G formula based on Cr of 2.43).    Microbiology: No results found for this or any previous visit (from the past 720 hour(s)).  Medical History: Past Medical History  Diagnosis Date  . Sick sinus syndrome   . Hematoma     At the site of the pacemaker insertion.  . Hypotension   . Dyslipidemia   . History of atrial fibrillation   . Arthritis     Osteoarthritis  . Port-a-cath in place 12/08/10  . Status post chemotherapy completed 03/2011    R - CHOP q 3 weeks x 6  . S/P radiation therapy 05/28/2011 - 06/26/2011    Right Abdomen and Right Pelvis/3060 cGy in 17 Fractions  . Hypertension   . ASHD (arteriosclerotic heart disease)   . Gout   . COPD (chronic obstructive pulmonary disease)   . Asthma   . Hyperlipidemia   . Diabetes mellitus   . Hypothyroidism   . Diffuse large B cell lymphoma     Dr. Chisom,oncology  . Cancer 10/2010    large B cell lymphoma  . 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 Pacemaker     left chest  . Dysrhythmia     Hx. A. Fib, sick sinus syndrome- Pacemaker inserted.  . Cataracts, bilateral   . Sepsis     08/2013   . Urinary tract infection 08/2013   . Pancytopenia due to chemotherapy   . Acute renal failure     CKD- Stage III   . Acute encephalopathy     hx of   . Neutropenia   . Enterococcus UTI 08/2013     Medications:  Scheduled:  . insulin aspart  0-15 Units Subcutaneous TID WC  . insulin aspart  0-5 Units Subcutaneous QHS   Assessment:  Asked to assist with antibiotic therapy - Vancomycin and Ceftriaxone - for this 73 year-old female with urosepsis.  Previous positive urine cultures have reported Klebsiella pneumonia, Enterococcus species, and Citrobacter freundii.  Mrs. Tippets is currently receiving Revlimid and Rituxan for Non-Hodgkin's lymphoma of the abdomen.  Her last Rituxan dose was given on 09/15/13.  The Vancomycin dose will be modified for renal insufficiency.  The Ceftriaxone dose does not require modification.   Goals of Therapy:   Vancomycin trough levels 15-20 mcg/ml  Eradication of infection   Plan:   Ceftriaxone 2 grams x 1 as ordered by ED physician, the 1 gram IV every 24 hours.  Vancomycin 1000 mg IV every 24 hours.  Vancomycin levels as needed to guide dosing.  BlandPh. 10/06/2013,9:42 AM

## 2013-10-06 NOTE — Consult Note (Addendum)
Consult: Right ureteral obstruction, sepsis Requested by: Alfonzo Feller, DO   History of Present Illness: 73 year old who was scheduled for routine stent exchange today.  She has right ureteral obstruction from non-Hodgkin's lymphoma and had a right nephrostomy tube internalized to a right double-J stent July 14, 2013. I planned to change her stent early (instead of waiting 6 months) as this is the first change.   She was admitted due to A. Fib, tachycardia and mental status changes.  I saw her earlier today when her Foley catheter was placed and this afternoon she has become progressively septic with hypotension, tachycardia and further mental status declined.  When I saw the patient she was a bit lethargic but knew she needed stent exchange.  She denied any dysuria or gross hematuria.  Past Medical History  Diagnosis Date  . Sick sinus syndrome   . Hematoma     At the site of the pacemaker insertion.  . Hypotension   . Dyslipidemia   . History of atrial fibrillation   . Arthritis     Osteoarthritis  . Port-a-cath in place 12/08/10  . Status post chemotherapy completed 03/2011    R - CHOP q 3 weeks x 6  . S/P radiation therapy 05/28/2011 - 06/26/2011    Right Abdomen and Right Pelvis/3060 cGy in 17 Fractions  . Hypertension   . ASHD (arteriosclerotic heart disease)   . Gout   . COPD (chronic obstructive pulmonary disease)   . Asthma   . Hyperlipidemia   . Diabetes mellitus   . Hypothyroidism   . Diffuse large B cell lymphoma     Dr. Chisom,oncology  . Cancer 10/2010    large B cell lymphoma  . 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 Pacemaker     left chest  . Dysrhythmia     Hx. A. Fib, sick sinus syndrome- Pacemaker inserted.  . Cataracts, bilateral   . Sepsis     08/2013   . Urinary tract infection 08/2013, 10/06/13  . Pancytopenia due to chemotherapy   . Acute renal failure     CKD- Stage III  . Acute encephalopathy     hx  of   . Neutropenia   . Enterococcus UTI 08/2013    Past Surgical History  Procedure Laterality Date  . Pacemaker insertion      Lead revision completed August 04, 2006- left chest G. Lovena Le  . Tubal ligation      Bilateral  . Insert / replace / remove pacemaker    . Abdominal hysterectomy    . Biopsy stomach  11/03/10    Soft Tissue Mass, Biopsy, Right Lower Quadrant Mesenteric Mass- High Grade Non-Hodgkins B Cell Lymphoma  with Flow Cytometry  . Bone biopsy  11/24/10    Bone Marrow, Aspirate Biospy, and clot, Left - No Involvement of Non-Hodgkin's Lymphoma Identified  . Portacath placement Right 06-23-13  . Cholecystectomy      laparoscopic  . Bladder suspension    . Ureteral stent placement  07/14/2013     Home Medications:  Prescriptions prior to admission  Medication Sig Dispense Refill  . albuterol (PROVENTIL) (2.5 MG/3ML) 0.083% nebulizer solution Take 3 mLs (2.5 mg total) by nebulization every 6 (six) hours as needed for shortness of breath.  75 mL  6  . allopurinol (ZYLOPRIM) 100 MG tablet Take 2 tablets (200 mg total) by mouth daily.      . bisacodyl (DULCOLAX) 5 MG EC tablet  Take 5 mg by mouth at bedtime.      . calcitonin, salmon, (MIACALCIN) 200 UNIT/ACT nasal spray Place 1 spray into alternate nostrils daily.      . citalopram (CELEXA) 20 MG tablet Take 20 mg by mouth daily.      Marland Kitchen dexamethasone (DECADRON) 2 MG tablet Take 1 tablet (2 mg total) by mouth 2 (two) times daily with a meal.  30 tablet  0  . diltiazem (DILACOR XR) 180 MG 24 hr capsule Take 180 mg by mouth daily.      . furosemide (LASIX) 20 MG tablet Take 20 mg by mouth daily. For 10 days starting 6/23      . insulin aspart (NOVOLOG) 100 UNIT/ML injection Inject 0-9 Units into the skin 3 (three) times daily with meals. CBG < 70: implement hypoglycemia protocol CBG 70 - 120: 0 units CBG 121 - 150: 1 unit CBG 151 - 200: 2 units CBG 201 - 250: 3 units CBG 251 - 300: 5 units CBG 301 - 350: 7 units CBG 351 - 400:  9 units CBG > 400: call MD.      . levothyroxine (SYNTHROID, LEVOTHROID) 50 MCG tablet Take 50 mcg by mouth daily before breakfast.      . lidocaine-prilocaine (EMLA) cream Apply 1 application topically as needed. Apply to port a cath site one hour before needle stick as needed.      . magnesium oxide (MAG-OX) 400 (241.3 MG) MG tablet Take 0.5 tablets (200 mg total) by mouth daily.  30 tablet  0  . ondansetron (ZOFRAN) 8 MG tablet Take 8 mg by mouth every 12 (twelve) hours as needed for nausea or vomiting.      . polyethylene glycol (MIRALAX / GLYCOLAX) packet Take 17 g by mouth 2 (two) times daily.      . pravastatin (PRAVACHOL) 40 MG tablet Take 40 mg by mouth at bedtime.      Marland Kitchen tiotropium (SPIRIVA) 18 MCG inhalation capsule Place 1 capsule (18 mcg total) into inhaler and inhale daily.  30 capsule  0  . URELLE (URELLE/URISED) 81 MG TABS tablet Take 1 tablet (81 mg total) by mouth 3 (three) times daily as needed for bladder spasms.      Marland Kitchen warfarin (COUMADIN) 2 MG tablet Take 2.5 mg by mouth daily at 6 PM.       Allergies:  Allergies  Allergen Reactions  . Codeine Nausea Only  . Penicillins Other (See Comments)    Unknown    Family History  Problem Relation Age of Onset  . Stroke Mother   . Stroke Father   . Aneurysm Son     Brain  . Cancer Sister      1 sister had vaginal cancer  . Cancer Brother     Prostate   Social History:  reports that she quit smoking about 2 years ago. Her smoking use included Cigarettes. She has a 45 pack-year smoking history. She has never used smokeless tobacco. She reports that she does not drink alcohol or use illicit drugs.  ROS: A complete review of systems was performed.  All systems are negative except for pertinent findings as noted. Review of Systems  All other systems reviewed and are negative.    Physical Exam:  Vital signs in last 24 hours: Temp:  [97.9 F (36.6 C)-100.9 F (38.3 C)] 100.9 F (38.3 C) (06/26 1430) Pulse Rate:   [49-173] 144 (06/26 1430) Resp:  [16-25] 19 (06/26 1430) BP: (77-107)/(32-84) 93/78  mmHg (06/26 1430) SpO2:  [92 %-100 %] 97 % (06/26 1430) Weight:  [92.171 kg (203 lb 3.2 oz)-96.1 kg (211 lb 13.8 oz)] 96.1 kg (211 lb 13.8 oz) (06/26 1100) General:  Alert and oriented, No acute distress HEENT: Normocephalic, atraumatic Neck: No JVD or lymphadenopathy Cardiovascular: Regular rate and rhythm Lungs: Regular rate and effort Abdomen: Soft, nontender, nondistended, no abdominal masses Back: No CVA tenderness Extremities: No edema Neurologic: Grossly intact GU: foley in place, urine cloudy  Laboratory Data:  Results for orders placed during the hospital encounter of 10/06/13 (from the past 24 hour(s))  PRO B NATRIURETIC PEPTIDE     Status: Abnormal   Collection Time    10/06/13  6:57 AM      Result Value Ref Range   Pro B Natriuretic peptide (BNP) 8619.0 (*) 0 - 125 pg/mL  CBC WITH DIFFERENTIAL     Status: Abnormal   Collection Time    10/06/13  6:58 AM      Result Value Ref Range   WBC 7.2  4.0 - 10.5 K/uL   RBC 3.51 (*) 3.87 - 5.11 MIL/uL   Hemoglobin 10.4 (*) 12.0 - 15.0 g/dL   HCT 31.1 (*) 36.0 - 46.0 %   MCV 88.6  78.0 - 100.0 fL   MCH 29.6  26.0 - 34.0 pg   MCHC 33.4  30.0 - 36.0 g/dL   RDW 20.0 (*) 11.5 - 15.5 %   Platelets PLATELET CLUMPS NOTED ON SMEAR, UNABLE TO ESTIMATE  150 - 400 K/uL   Neutrophils Relative % 91 (*) 43 - 77 %   Lymphocytes Relative 3 (*) 12 - 46 %   Monocytes Relative 6  3 - 12 %   Eosinophils Relative 0  0 - 5 %   Basophils Relative 0  0 - 1 %   Neutro Abs 6.6  1.7 - 7.7 K/uL   Lymphs Abs 0.2 (*) 0.7 - 4.0 K/uL   Monocytes Absolute 0.4  0.1 - 1.0 K/uL   Eosinophils Absolute 0.0  0.0 - 0.7 K/uL   Basophils Absolute 0.0  0.0 - 0.1 K/uL   WBC Morphology TOXIC GRANULATION    COMPREHENSIVE METABOLIC PANEL     Status: Abnormal   Collection Time    10/06/13  6:58 AM      Result Value Ref Range   Sodium 129 (*) 137 - 147 mEq/L   Potassium 4.8  3.7 -  5.3 mEq/L   Chloride 89 (*) 96 - 112 mEq/L   CO2 21  19 - 32 mEq/L   Glucose, Bld 327 (*) 70 - 99 mg/dL   BUN 71 (*) 6 - 23 mg/dL   Creatinine, Ser 2.43 (*) 0.50 - 1.10 mg/dL   Calcium 9.3  8.4 - 10.5 mg/dL   Total Protein 6.0  6.0 - 8.3 g/dL   Albumin 2.1 (*) 3.5 - 5.2 g/dL   AST 18  0 - 37 U/L   ALT 41 (*) 0 - 35 U/L   Alkaline Phosphatase 107  39 - 117 U/L   Total Bilirubin 0.4  0.3 - 1.2 mg/dL   GFR calc non Af Amer 19 (*) >90 mL/min   GFR calc Af Amer 22 (*) >90 mL/min  PROTIME-INR     Status: Abnormal   Collection Time    10/06/13  6:58 AM      Result Value Ref Range   Prothrombin Time 30.0 (*) 11.6 - 15.2 seconds   INR 2.86 (*) 0.00 -  1.49  LIPASE, BLOOD     Status: None   Collection Time    10/06/13  6:58 AM      Result Value Ref Range   Lipase 34  11 - 59 U/L  TROPONIN I     Status: None   Collection Time    10/06/13  6:58 AM      Result Value Ref Range   Troponin I <0.30  <0.30 ng/mL  I-STAT CG4 LACTIC ACID, ED     Status: None   Collection Time    10/06/13  7:13 AM      Result Value Ref Range   Lactic Acid, Venous 1.83  0.5 - 2.2 mmol/L  URINALYSIS, ROUTINE W REFLEX MICROSCOPIC     Status: Abnormal   Collection Time    10/06/13  7:55 AM      Result Value Ref Range   Color, Urine YELLOW  YELLOW   APPearance CLOUDY (*) CLEAR   Specific Gravity, Urine 1.012  1.005 - 1.030   pH 6.0  5.0 - 8.0   Glucose, UA 250 (*) NEGATIVE mg/dL   Hgb urine dipstick LARGE (*) NEGATIVE   Bilirubin Urine NEGATIVE  NEGATIVE   Ketones, ur NEGATIVE  NEGATIVE mg/dL   Protein, ur 100 (*) NEGATIVE mg/dL   Urobilinogen, UA 0.2  0.0 - 1.0 mg/dL   Nitrite NEGATIVE  NEGATIVE   Leukocytes, UA MODERATE (*) NEGATIVE  URINE MICROSCOPIC-ADD ON     Status: Abnormal   Collection Time    10/06/13  7:55 AM      Result Value Ref Range   Squamous Epithelial / LPF FEW (*) RARE   WBC, UA 7-10  <3 WBC/hpf   RBC / HPF 11-20  <3 RBC/hpf   Bacteria, UA MANY (*) RARE   Urine-Other MUCOUS PRESENT     PROCALCITONIN     Status: None   Collection Time    10/06/13 10:17 AM      Result Value Ref Range   Procalcitonin 6.24    COMPREHENSIVE METABOLIC PANEL     Status: Abnormal   Collection Time    10/06/13 10:17 AM      Result Value Ref Range   Sodium 132 (*) 137 - 147 mEq/L   Potassium 4.7  3.7 - 5.3 mEq/L   Chloride 91 (*) 96 - 112 mEq/L   CO2 23  19 - 32 mEq/L   Glucose, Bld 341 (*) 70 - 99 mg/dL   BUN 71 (*) 6 - 23 mg/dL   Creatinine, Ser 2.56 (*) 0.50 - 1.10 mg/dL   Calcium 9.0  8.4 - 10.5 mg/dL   Total Protein 5.5 (*) 6.0 - 8.3 g/dL   Albumin 2.0 (*) 3.5 - 5.2 g/dL   AST 16  0 - 37 U/L   ALT 36 (*) 0 - 35 U/L   Alkaline Phosphatase 109  39 - 117 U/L   Total Bilirubin 0.2 (*) 0.3 - 1.2 mg/dL   GFR calc non Af Amer 18 (*) >90 mL/min   GFR calc Af Amer 20 (*) >90 mL/min  PHOSPHORUS     Status: None   Collection Time    10/06/13 10:17 AM      Result Value Ref Range   Phosphorus 4.6  2.3 - 4.6 mg/dL  MAGNESIUM     Status: None   Collection Time    10/06/13 10:17 AM      Result Value Ref Range   Magnesium 2.0  1.5 - 2.5  mg/dL  CBC WITH DIFFERENTIAL     Status: Abnormal   Collection Time    10/06/13 10:17 AM      Result Value Ref Range   WBC 6.2  4.0 - 10.5 K/uL   RBC 3.28 (*) 3.87 - 5.11 MIL/uL   Hemoglobin 9.7 (*) 12.0 - 15.0 g/dL   HCT 29.1 (*) 36.0 - 46.0 %   MCV 88.7  78.0 - 100.0 fL   MCH 29.6  26.0 - 34.0 pg   MCHC 33.3  30.0 - 36.0 g/dL   RDW 20.1 (*) 11.5 - 15.5 %   Platelets 24 (*) 150 - 400 K/uL   Neutrophils Relative % 93 (*) 43 - 77 %   Lymphocytes Relative 4 (*) 12 - 46 %   Monocytes Relative 3  3 - 12 %   Eosinophils Relative 0  0 - 5 %   Basophils Relative 0  0 - 1 %   Neutro Abs 5.8  1.7 - 7.7 K/uL   Lymphs Abs 0.2 (*) 0.7 - 4.0 K/uL   Monocytes Absolute 0.2  0.1 - 1.0 K/uL   Eosinophils Absolute 0.0  0.0 - 0.7 K/uL   Basophils Absolute 0.0  0.0 - 0.1 K/uL   WBC Morphology INCREASED BANDS (>20% BANDS)    APTT     Status: Abnormal    Collection Time    10/06/13 10:17 AM      Result Value Ref Range   aPTT 49 (*) 24 - 37 seconds  PROTIME-INR     Status: Abnormal   Collection Time    10/06/13 10:17 AM      Result Value Ref Range   Prothrombin Time 30.4 (*) 11.6 - 15.2 seconds   INR 2.91 (*) 0.00 - 1.49  TSH     Status: Abnormal   Collection Time    10/06/13 10:17 AM      Result Value Ref Range   TSH 0.105 (*) 0.350 - 4.500 uIU/mL  MRSA PCR SCREENING     Status: None   Collection Time    10/06/13 11:08 AM      Result Value Ref Range   MRSA by PCR NEGATIVE  NEGATIVE  GLUCOSE, CAPILLARY     Status: Abnormal   Collection Time    10/06/13 12:13 PM      Result Value Ref Range   Glucose-Capillary 333 (*) 70 - 99 mg/dL   Comment 1 Notify RN     Recent Results (from the past 240 hour(s))  MRSA PCR SCREENING     Status: None   Collection Time    10/06/13 11:08 AM      Result Value Ref Range Status   MRSA by PCR NEGATIVE  NEGATIVE Final   Comment:            The GeneXpert MRSA Assay (FDA     approved for NASAL specimens     only), is one component of a     comprehensive MRSA colonization     surveillance program. It is not     intended to diagnose MRSA     infection nor to guide or     monitor treatment for     MRSA infections.   Creatinine:  Recent Labs  10/06/13 0658 10/06/13 1017  CREATININE 2.43* 2.56*   KUB-stent is in good position on the right without obvious stones.  Impression/Assessment/plan: Sepsis - ? Urinary source. I discussed with my partner Dr. Risa Grill who agreed to perform stent  exchange in the morning if patient stabilizes.  I sent a KUB to ensure stent is in proper position and has no stone on it. KUB looks good.  The KUB will not give [place] any information about hydronephrosis/obstruction.  She would require renal ultrasound or CT.  If she has significant hydronephrosis consideration could be made a right percutaneous nephrostomy tube to temporize the patient.  She required percutaneous  nephrostomy tube in the past for urosepsis. I dicsussed patient with Dr. Janice Norrie, Dr. Risa Grill and NP Alfredo Martinez.  Festus Aloe 10/06/2013, 2:41 PM

## 2013-10-06 NOTE — ED Notes (Signed)
Pt sent to ED for evaluation of fever and leg edema. Pt was suppose to have urinary stent removed today, but due to fever and other factors pt will not have surgery today. anaesthesia was at bedside and talked with surgeon.

## 2013-10-06 NOTE — Progress Notes (Signed)
PHARMACIST - PHYSICIAN COMMUNICATION DR:  Charlies Silvers CONCERNING: Pharmacy Care Issues Regarding Warfarin Labs  RECOMMENDATION (Action Taken): Daily protime for three days has been ordered to meet the [facility] National Patient safety goal and comply with the current [place].   The Pharmacy will defer all warfarin dose order changes and follow up of lab results to the prescriber unless an additional order to initiate a "pharmacy Coumadin consult" is placed.  DESCRIPTION:  While hospitalized, to be in compliance with [facility] [place] Patient Safety Goals, all patients on warfarin must have a baseline and/or current protime prior to the administration of warfarin. Pharmacy has received your order for warfarin without these required laboratory assessments.

## 2013-10-06 NOTE — Progress Notes (Signed)
Mamou, RN, BSN, CCM  [phone]  Chart Reviewed for discharge and hospital needs.  Discharge needs at time of review: None present will follow for needs.  IM UPDATE NEEDED ON [id number] IF REMAINS INPATIENT Review of patient progress due on [id number].

## 2013-10-06 NOTE — ED Notes (Signed)
Bed: WA17 Expected date:  Expected time:  Means of arrival:  Comments: EMS-abnormal labs 

## 2013-10-07 ENCOUNTER — Encounter (HOSPITAL_COMMUNITY)
Admission: EM | Disposition: A | Discharge status: Skilled Nursing Facility | Payer: Self-pay | Source: Home / Self Care | Attending: Pulmonary Disease

## 2013-10-07 LAB — COMPREHENSIVE METABOLIC PANEL
ALBUMIN: 1.8 g/dL — AB (ref 3.5–5.2)
ALT: 81 U/L — ABNORMAL HIGH (ref 0–35)
AST: 72 U/L — AB (ref 0–37)
Alkaline Phosphatase: 150 U/L — ABNORMAL HIGH (ref 39–117)
BILIRUBIN TOTAL: 0.5 mg/dL (ref 0.3–1.2)
BUN: 68 mg/dL — ABNORMAL HIGH (ref 6–23)
CHLORIDE: 91 meq/L — AB (ref 96–112)
CO2: 21 mEq/L (ref 19–32)
Calcium: 8.3 mg/dL — ABNORMAL LOW (ref 8.4–10.5)
Creatinine, Ser: 2.79 mg/dL — ABNORMAL HIGH (ref 0.50–1.10)
GFR calc Af Amer: 18 mL/min — ABNORMAL LOW (ref >90)
GFR calc non Af Amer: 16 mL/min — ABNORMAL LOW (ref >90)
Glucose, Bld: 242 mg/dL — ABNORMAL HIGH (ref 70–99)
Potassium: 4.2 mEq/L (ref 3.7–5.3)
SODIUM: 129 meq/L — AB (ref 137–147)
Total Protein: 5.5 g/dL — ABNORMAL LOW (ref 6.0–8.3)

## 2013-10-07 LAB — CBC
HCT: 29.5 % — ABNORMAL LOW (ref 36.0–46.0)
HEMOGLOBIN: 9.7 g/dL — AB (ref 12.0–15.0)
MCH: 29 pg (ref 26.0–34.0)
MCHC: 32.9 g/dL (ref 30.0–36.0)
MCV: 88.3 fL (ref 78.0–100.0)
Platelets: 22 10*3/uL — CL (ref 150–400)
RBC: 3.34 MIL/uL — ABNORMAL LOW (ref 3.87–5.11)
RDW: 19.8 % — ABNORMAL HIGH (ref 11.5–15.5)
WBC: 6.6 10*3/uL (ref 4.0–10.5)

## 2013-10-07 LAB — GLUCOSE, CAPILLARY
GLUCOSE-CAPILLARY: 231 mg/dL — AB (ref 70–99)
GLUCOSE-CAPILLARY: 270 mg/dL — AB (ref 70–99)
Glucose-Capillary: 233 mg/dL — ABNORMAL HIGH (ref 70–99)
Glucose-Capillary: 292 mg/dL — ABNORMAL HIGH (ref 70–99)

## 2013-10-07 LAB — PROTIME-INR
INR: 3.4 — ABNORMAL HIGH (ref 0.00–1.49)
Prothrombin Time: 34.3 seconds — ABNORMAL HIGH (ref 11.6–15.2)

## 2013-10-07 LAB — TROPONIN I

## 2013-10-07 SURGERY — CYSTOSCOPY, WITH STENT INSERTION
Anesthesia: General

## 2013-10-07 MED ORDER — VANCOMYCIN HCL IN DEXTROSE 1-5 GM/200ML-% IV SOLN: 1000.0000 mg | INTRAVENOUS | Status: DC

## 2013-10-07 MED ORDER — HYDROCORTISONE NA SUCCINATE PF 100 MG IJ SOLR
50.0000 mg | Freq: Four times a day (QID) | INTRAMUSCULAR | Status: DC
Start: 2013-10-07 — End: 2013-10-10
  Administered 2013-10-07 – 2013-10-10 (×12): 50 mg via INTRAVENOUS
  Filled 2013-10-07 (×12): qty 2

## 2013-10-07 MED ORDER — PIPERACILLIN-TAZOBACTAM IN DEX 2-0.25 GM/50ML IV SOLN
2.2500 g | Freq: Four times a day (QID) | INTRAVENOUS | Status: DC
  Administered 2013-10-07 – 2013-10-09 (×7): 2.25 g via INTRAVENOUS
  Filled 2013-10-07 (×10): qty 50

## 2013-10-07 MED ORDER — PIPERACILLIN-TAZOBACTAM IN DEX 2-0.25 GM/50ML IV SOLN
2.2500 g | Freq: Four times a day (QID) | INTRAVENOUS | Status: DC
  Filled 2013-10-07: qty 50

## 2013-10-07 NOTE — ED Provider Notes (Signed)
Medical screening examination/treatment/procedure(s) were conducted as a shared visit with non-physician practitioner(s) and myself.  I personally evaluated the patient during the encounter. Please see my previous note.   EKG Interpretation None        Alfonzo Feller, DO 10/07/13 2133

## 2013-10-07 NOTE — Progress Notes (Signed)
ANTIBIOTIC CONSULT NOTE - Follow Up  Pharmacy Consult for:  Zosyn and Vancomycin Indication:  Urosepsis  Allergies  Allergen Reactions  . Codeine Nausea Only  . Penicillins Other (See Comments)    Unknown    Patient Measurements: Height: 4\' 11"  (149.9 cm) Weight: 220 lb 7.4 oz (100 kg) IBW/kg (Calculated) : 43.2   Vital Signs: Temp: 98.1 F (36.7 C) (06/27 1201) Temp src: Oral (06/27 1201) BP: 98/55 mmHg (06/27 1200) Pulse Rate: 92 (06/27 1200)  Labs:  Recent Labs  10/06/13 0658 10/06/13 1017 10/07/13 0425  WBC 7.2 6.2 6.6  HGB 10.4* 9.7* 9.7*  PLT PLATELET CLUMPS NOTED ON SMEAR, UNABLE TO ESTIMATE 24* 22*  CREATININE 2.43* 2.56* 2.79*   Estimated Creatinine Clearance: 19 ml/min (by C-G formula based on Cr of 2.79).    Microbiology: Recent Results (from the past 720 hour(s))  CULTURE, BLOOD (ROUTINE X 2)     Status: None   Collection Time    10/06/13  6:58 AM      Result Value Ref Range Status   Specimen Description BLOOD   Final   Special Requests BOTTLES DRAWN AEROBIC AND ANAEROBIC LEFT AC 5 CC   Final   Culture  Setup Time     Final   Value: 10/06/2013 10:33     Performed at [facility]   Culture     Final   Value: GRAM NEGATIVE RODS     Note: Gram Stain Report Called to,Read Back By and Verified With: BECKY DAVIS ON 10/06/2013 AT 8:12P BY WILEJ     Performed at [facility]   Report Status PENDING   Incomplete  URINE CULTURE     Status: None   Collection Time    10/06/13  7:55 AM      Result Value Ref Range Status   Specimen Description URINE, RANDOM   Final   Special Requests NONE   Final   Culture  Setup Time     Final   Value: 10/06/2013 10:41     Performed at [facility]     Final   Value: >=100,000 COLONIES/ML     Performed at [facility]   Culture     Final   Value: [place]     Performed at [facility]   Report Status PENDING   Incomplete  CULTURE, BLOOD (ROUTINE X 2)      Status: None   Collection Time    10/06/13  9:05 AM      Result Value Ref Range Status   Specimen Description BLOOD   Final   Special Requests BOTTLES DRAWN AEROBIC AND ANAEROBIC PORT 5 CC   Final   Culture  Setup Time     Final   Value: 10/06/2013 12:38     Performed at [facility]   Culture     Final   Value: GRAM NEGATIVE RODS     Note: Gram Stain Report Called to,Read Back By and Verified With: BECKY DAVIS ON 10/06/2013 AT 8:12P BY WILEJ     Performed at [facility]   Report Status PENDING   Incomplete  MRSA PCR SCREENING     Status: None   Collection Time    10/06/13 11:08 AM      Result Value Ref Range Status   MRSA by PCR NEGATIVE  NEGATIVE Final   Comment:  The GeneXpert MRSA Assay (FDA     approved for NASAL specimens     only), is one component of a     comprehensive MRSA colonization     surveillance program. It is not     intended to diagnose MRSA     infection nor to guide or     monitor treatment for     MRSA infections.    Medical History: Past Medical History  Diagnosis Date  . Sick sinus syndrome   . Hematoma     At the site of the pacemaker insertion.  . Hypotension   . Dyslipidemia   . History of atrial fibrillation   . Arthritis     Osteoarthritis  . Port-a-cath in place 12/08/10  . Status post chemotherapy completed 03/2011    R - CHOP q 3 weeks x 6  . S/P radiation therapy 05/28/2011 - 06/26/2011    Right Abdomen and Right Pelvis/3060 cGy in 17 Fractions  . Hypertension   . ASHD (arteriosclerotic heart disease)   . Gout   . COPD (chronic obstructive pulmonary disease)   . Asthma   . Hyperlipidemia   . Diabetes mellitus   . Hypothyroidism   . Diffuse large B cell lymphoma     Dr. Chisom,oncology  . Cancer 10/2010    large B cell lymphoma  . 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 Pacemaker     left chest  . Dysrhythmia     Hx. A. Fib, sick sinus syndrome- Pacemaker  inserted.  . Cataracts, bilateral   . Sepsis     08/2013   . Urinary tract infection 08/2013, 10/06/13  . Pancytopenia due to chemotherapy   . Acute renal failure     CKD- Stage III  . Acute encephalopathy     hx of   . Neutropenia   . Enterococcus UTI 08/2013     Medications:  Scheduled:  . bisacodyl  5 mg Oral QHS  . calcitonin (salmon)  1 spray Alternating Nares Daily  . hydrocortisone sod succinate (SOLU-CORTEF) inj  50 mg Intravenous Q6H  . insulin aspart  0-15 Units Subcutaneous TID WC  . insulin aspart  0-5 Units Subcutaneous QHS  . levothyroxine  50 mcg Oral QAC breakfast  . magnesium oxide  200 mg Oral Daily  . piperacillin-tazobactam (ZOSYN)  IV  3.375 g Intravenous Q8H  . sodium chloride  3 mL Intravenous Q12H  . vancomycin  1,000 mg Intravenous Q24H   Assessment: Asked to assist with antibiotic therapy - Vancomycin and Zosyn - for this 73 year-old female with urosepsis. Previous positive urine cultures have reported Klebsiella pneumonia, Enterococcus species, and Citrobacter freundii. [name] is currently receiving Revlimid and Rituxan for Non-Hodgkin's lymphoma of the abdomen.  Her last Rituxan dose was given on 09/15/13.  6/26 >> Vancomycin >> 6/26 >> Zosyn >> 6/26 >> Rocephin x 1   Tmax: 102.6, febrile consistently  WBC: WNL  SCr 2.79, up. CrCl now 19 ml/min (20 N)   Goals of Therapy:   Vancomycin trough levels 15-20 mcg/ml  Eradication of infection   Plan:  1) Due to rise in SCr, change Vancomycin from 1g q24 to 1g q48 2) Due to rise in SCr, change Zosyn from EI q8 to 2.25g IV q6 3) Will continue to follow renal function   Adrian Saran, PharmD, BCPS Pager [phone] 10/07/2013 12:51 PM

## 2013-10-07 NOTE — Progress Notes (Signed)
PULMONARY / CRITICAL CARE MEDICINE   Name: [name] MRN: [id number] DOB: 01-Sep-1940    ADMISSION DATE:  10/06/2013 CONSULTATION DATE:  10/06/13  REFERRING MD :  Dr. Charlies Silvers  PRIMARY SERVICE: TRH  CHIEF COMPLAINT:  Urosepsis   BRIEF PATIENT DESCRIPTION: 73 y/o F, SNF Resident,  with PMH of HTN, HLD, recurrent lymphoma, COPD, CKD, R ureteral stent placement (4/3) and recent admission for Enterococcus UTI (5/20-5/28) who was admitted on 6/26 with recurrent sepsis / UTI.  PCCM consulted for evaluation.    SIGNIFICANT EVENTS: 10/2010 - Dx with NHL 04/2011 - Chemo R CHOP + XRT with good response 10/2012 - Recurrence, Rx with salvage chemo 05/2013 - Recurrence, CT A/P >> increased LAN with severe R hydro 06/2013 - Admission for sepsis, pyelo 07/14/13 - R ureteral stent placement  5/20-5/28 - Admission for Enterococcus UTI / Sepsis. AKI/CKD- multi-factorial with R-sided hydro, nephrotoxic agents, urosepsis, hypercalcemia, and volume depletion related to diarrhea as well as nephrotoxic agents. Improved with IVF's. Creatinine as high as 7.6 down to 2.58 on d/c. She underwent renal ultrasound which showed right stent in place, no hydronephrosis on the right or the left. ...................................................................................................................................................... 6/26 - Readmit with c/o fever, increased swelling, periods of hypoglycemia.  UTI +, Sepsis   STUDIES:  6/26 renal US >> no hnosis, stent shadow  LINES / TUBES: R CW Port >>>  CULTURES: Hx Enterococcus UTI 5/20>> sens Vanco, nitrofurantioin BCx2 6/26 >> GNR >> UC 6/26 >>  ANTIBIOTICS: Rocephin 6/26 >>>x1 Zosyn 6/26 >>> Vanco 6/26 >>>  SUBJECTIVE: Febrile On neo gtt Denies CP, dyspnea  VITAL SIGNS: Temp:  [97.9 F (36.6 C)-102.7 F (39.3 C)] 100.8 F (38.2 C) (06/27 0930) Pulse Rate:  [49-173] 91 (06/27 0930) Resp:  [17-30] 21 (06/27 0930) BP: (65-133)/(20-85)  104/64 mmHg (06/27 0930) SpO2:  [92 %-100 %] 100 % (06/27 0930) Weight:  [96.1 kg (211 lb 13.8 oz)-100 kg (220 lb 7.4 oz)] 100 kg (220 lb 7.4 oz) (06/27 0500)  HEMODYNAMICS:    VENTILATOR SETTINGS:    INTAKE / OUTPUT: Intake/Output     06/26 0701 - 06/27 0700 06/27 0701 - 06/28 0700   P.O.  120   I.V. (mL/kg) 2519.8 (25.2) 175 (1.8)   IV Piggyback 1350    Total Intake(mL/kg) 3869.8 (38.7) 295 (3)   Urine (mL/kg/hr) 1170 (0.5) 75 (0.3)   Total Output 1170 75   Net +2699.8 +220          PHYSICAL EXAMINATION: General:  Morbidly obese in NAD Neuro:  Awake, alert, MAE, oriented to self but confused otherwise HEENT:  Mm pink/dry, no jvd Cardiovascular:  s1s2 rrr, no m/r/g Lungs:  resp's even/non-labored, lungs bilaterally clear Abdomen:  Obese, soft, bsx4 active Musculoskeletal:  No acute deformities , peripheral pulses + Skin:  Warm/dry, 2+ pitting edema in LE, good cap refill  LABS:  CBC  Recent Labs Lab 10/06/13 0658 10/06/13 1017 10/07/13 0425  WBC 7.2 6.2 6.6  HGB 10.4* 9.7* 9.7*  HCT 31.1* 29.1* 29.5*  PLT PLATELET CLUMPS NOTED ON SMEAR, UNABLE TO ESTIMATE 24* 22*   Coag's  Recent Labs Lab 10/06/13 0658 10/06/13 1017 10/07/13 0425  APTT  --  49*  --   INR 2.86* 2.91* 3.40*   BMET  Recent Labs Lab 10/06/13 0658 10/06/13 1017 10/07/13 0425  NA 129* 132* 129*  K 4.8 4.7 4.2  CL 89* 91* 91*  CO2 21 23 21   BUN 71* 71* 68*  CREATININE 2.43* 2.56* 2.79*  GLUCOSE 327* 341* 242*   Electrolytes  Recent Labs Lab 10/06/13 0658 10/06/13 1017 10/07/13 0425  CALCIUM 9.3 9.0 8.3*  MG  --  2.0  --   PHOS  --  4.6  --    Sepsis Markers  Recent Labs Lab 10/06/13 0713 10/06/13 1017 10/06/13 1450  LATICACIDVEN 1.83  --  4.7*  PROCALCITON  --  6.24  --    ABG No results found for this basename: PHART, PCO2ART, PO2ART,  in the last 168 hours Liver Enzymes  Recent Labs Lab 10/06/13 0658 10/06/13 1017 10/07/13 0425  AST 18 16 72*  ALT 41*  36* 81*  ALKPHOS 107 109 150*  BILITOT 0.4 0.2* 0.5  ALBUMIN 2.1* 2.0* 1.8*   Cardiac Enzymes  Recent Labs Lab 10/06/13 0657  10/06/13 1447 10/06/13 2200 10/07/13 0425  TROPONINI  --   < > <0.30 <0.30 <0.30  PROBNP 8619.0*  --   --   --   --   < > = values in this interval not displayed. Glucose  Recent Labs Lab 10/06/13 1213 10/06/13 1602 10/06/13 2123 10/07/13 0725  GLUCAP 333* 275* 290* 233*    Imaging Dg Abd 1 View  10/06/2013   CLINICAL DATA:  Chronic renal failure. Right ureteral stent. History of lymphoma.  EXAM: ABDOMEN - 1 VIEW  COMPARISON:  CT 06/05/2013.  FINDINGS: Soft tissue structures are unremarkable. Gas pattern nonspecific. Double-J right ureteral stent is noted in good anatomic position. Catheter with tip projected over bladder noted. Degenerative changes lumbar spine and both hips.  IMPRESSION: 1. No acute abnormality. 2. Double-J right ureteral stent in good anatomic position.   Electronically Signed   By: Marcello Moores  Register   On: 10/06/2013 12:37   US Renal  10/06/2013   CLINICAL DATA:  Evaluate for hydronephrosis. History of right stent placement and stones.  EXAM: RENAL/URINARY TRACT ULTRASOUND COMPLETE  COMPARISON:  09/07/2013  FINDINGS: Right Kidney:  Length: 9.0 cm. Shadowing echogenic foci measuring up to 9 mm in length were noted in the interpolar region. No mass or hydronephrosis visualized.  Left Kidney:  Length: 12.2 cm. Echogenicity within normal limits. No mass or hydronephrosis visualized.  Bladder:  Foley catheter noted.  IMPRESSION: 1. No hydronephrosis. 2. Shadowing echogenic foci in the right kidney may represent nonobstructing stones.   Electronically Signed   By: Logan Bores   On: 10/06/2013 16:02   Dg Chest Port 1 View  10/06/2013   CLINICAL DATA:  Fever  EXAM: PORTABLE CHEST - 1 VIEW  COMPARISON:  09/07/2013  FINDINGS: Do lead pacemaker appears unchanged. Right-sided power port has its tip in the SVC 2 cm above the right atrium, unchanged.  Heart size and mediastinal shadows are normal. The lungs remain clear. No effusions.  IMPRESSION: No active disease.   Electronically Signed   By: Nelson Chimes M.D.   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 - d/c sr cr on 6/19 1.7 R Hydronephrosis s/p R ureteral stent placement 4/3 Hyponatremia  P:    Urology input regarding stent exchange noted - plan once shock resolved hold allopurinol in setting of recent renal injury Hold lasix, cardizem  GASTROINTESTINAL A:   Hx Constipation P:   Carb modified diet Dulcolax QHS   HEMATOLOGIC A:   Relapsed, 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 Anemia Coagulopathy - on comadin for afib, INR 2.86 on admit P:  HOLD Coumadin Monitor CBC / INR   INFECTIOUS A:   UTI  Septic shock  GNR P:   ABX / Cultures as above Dc vanc in 24h   ENDOCRINE A:   Diabetes Mellitus Hypothyroidism Gout P:   SSI  Continue synthroid hold allopurinol   NEUROLOGIC A:   Acute Encephalopathy  P:   Supportive care, promote sleep / wake cycle Minimize sedating medications Mobilize as able   Summary - Has progressed to Septic shock with rising lactate & pressors, avoid fluids since overloaded already  I have personally obtained a history, examined the patient, evaluated laboratory and imaging results, formulated the assessment and plan and placed orders.  Care during the described time interval was provided by me and/or other providers on the  critical care team.  I have reviewed this patient's available data, including medical history, events of note, physical examination and test results as part of my evaluation  CC time x 37m  Kara Mead MD. FCCP. [place] [facility] Pager [phone] If no response call [phone]     10/07/2013, 9:43 AM

## 2013-10-07 NOTE — Progress Notes (Signed)
Clinical Social Work Department BRIEF PSYCHOSOCIAL ASSESSMENT 10/07/2013  Patient:  CUBA, NATARAJAN     Account Number:  [id number]     Admit date:  10/06/2013  Clinical Social Worker:  Levie Heritage  Date/Time:  10/07/2013 02:33 PM  Referred by:  Physician  Date Referred:  10/07/2013 Referred for  SNF Placement   Other Referral:   Interview type:  Other - See comment Other interview type:   Pt's niece, Mrs. Claris Pong, via phone.    PSYCHOSOCIAL DATA Living Status:  FACILITY Admitted from facility:  [facility] Level of care:  [place] Primary support name:  Mrs. Alexandra Primary support relationship to patient:  FAMILY Degree of support available:   strong    CURRENT CONCERNS Current Concerns  Post-Acute Placement   Other Concerns:    SOCIAL WORK ASSESSMENT / PLAN Spoke with Pt's niece, Mrs. Claris Pong, via phone, as, per chart, Pt not able to fully participate in assessment.    Mrs. Alexandra confirmed that Pt is from [facility] and that she has been there for rehab.  She stated that, although Pt prefers to go home, it's not feasible or safe for Pt to go anywhere other than a facility.    Mrs. Claris Pong stated that [facility] is assisting her in applying for [facility], as both she and the facility feel that Pt will need long-term care.    Mrs. Alexandra asked CSW if placement at [facility] was an option, as Mrs. Claris Pong stated that [facility] is significantly closer to her and the rest of the family than [facility].  CSW to inquire about availability at [facility].    CSW thanked Mrs. Alexandra for her time.   Assessment/plan status:  Psychosocial Support/Ongoing Assessment of Needs Other assessment/ plan:   Information/referral to community resources:   n/a--from a facility    PATIENT'S/FAMILY'S RESPONSE TO PLAN OF CARE: Mrs. Claris Pong was calm, cooperative and very pleasant. She stated that Pt is upset  and angry about having to be at a nursing facility but that Pt is unable to care for herself properly at home.  Mrs. Alexandra expressed her happiness with [facility] but did ask CSW to inquire about availability at [facility].   Bernita Raisin, [place] Social Work [phone]

## 2013-10-07 NOTE — Progress Notes (Addendum)
Clinical Social Work Department CLINICAL SOCIAL WORK PLACEMENT NOTE 10/07/2013  Patient:  [name], [name]  Account Number:  [id number] Admit date:  10/06/2013  Clinical Social Worker:  Levie Heritage  Date/time:  10/07/2013 02:39 PM  Clinical Social Work is seeking post-discharge placement for this patient at the following level of care:   SKILLED NURSING   (*CSW will update this form in Epic as items are completed)   10/10/2013  Patient/family provided with [place] Department of Clinical Social Work's list of facilities offering this level of care within the geographic area requested by the patient (or if unable, by the patient's family).  10/10/2013  Patient/family informed of their freedom to choose among providers that offer the needed level of care, that participate in Medicare, Medicaid or managed care program needed by the patient, have an available bed and are willing to accept the patient.  10/10/2013  Patient/family informed of [facility]' ownership interest in [facility], as well as of the fact that they are under no obligation to receive care at this facility.  PASARR submitted to EDS on existing PASARR number received on   FL2 transmitted to all facilities in geographic area requested by pt/family on  10/07/2013 and 10/10/2013 FL2 transmitted to all facilities within larger geographic area on   Patient informed that his/her managed care company has contracts with or will negotiate with  certain facilities, including the following:     Patient/family informed of bed offers received:  10/18/2013 Patient chooses bed at [facility] Physician recommends and patient chooses bed at    Patient to be transferred to  on  [facility] on 10/18/2013 Patient to be transferred to facility by ambulance [facility]y [facility]) Patient and family notified of transfer on 10/18/2013 Name of family member notified:  Patient notified at bedside and  pt sister, [name] notified via telephone  The following physician request were entered in Epic:   Additional Comments:  Bernita Raisin, [place] Work [phone]

## 2013-10-07 NOTE — Progress Notes (Signed)
Subjective: Patient was originally scheduled for routine stent exchange yesterday. That procedure was canceled when the patient's found to have urosepsis. Ultrasound last night did not show any hydronephrosis. She continues on pressor support and has continued to be febrile. She has improved over the course of the evening but remains uroseptic.  Objective: Vital signs in last 24 hours: Temp:  [97.9 F (36.6 C)-102.7 F (39.3 C)] 101.5 F (38.6 C) (06/27 0730) Pulse Rate:  [49-173] 103 (06/27 0730) Resp:  [17-30] 20 (06/27 0730) BP: (65-133)/(20-85) 99/69 mmHg (06/27 0730) SpO2:  [92 %-100 %] 99 % (06/27 0730) Weight:  [203 lb 3.2 oz (92.171 kg)-220 lb 7.4 oz (100 kg)] 220 lb 7.4 oz (100 kg) (06/27 0500)  Intake/Output from previous day: 06/26 0701 - 06/27 0700 In: 3869.8 [I.V.:2519.8; IV Piggyback:1350] Out: 9417 [Urine:1170] Intake/Output this shift:    Physical Exam:  Constitutional: Vital signs reviewed. WD WN in NAD   Eyes: PERRL, No scleral icterus.   Cardiovascular: RRR Pulmonary/Chest: Normal effort Abdominal: Soft. Genitourinary: Not examined Extremities: No cyanosis or edema   Lab Results:  Recent Labs  10/06/13 0658 10/06/13 1017 10/07/13 0425  HGB 10.4* 9.7* 9.7*  HCT 31.1* 29.1* 29.5*   BMET  Recent Labs  10/06/13 1017 10/07/13 0425  NA 132* 129*  K 4.7 4.2  CL 91* 91*  CO2 23 21  GLUCOSE 341* 242*  BUN 71* 68*  CREATININE 2.56* 2.79*  CALCIUM 9.0 8.3*    Recent Labs  10/06/13 0658 10/06/13 1017 10/07/13 0425  INR 2.86* 2.91* 3.40*   No results found for this basename: LABURIN,  in the last 72 hours Results for orders placed during the hospital encounter of 10/06/13  CULTURE, BLOOD (ROUTINE X 2)     Status: None   Collection Time    10/06/13  6:58 AM      Result Value Ref Range Status   Specimen Description BLOOD   Final   Special Requests BOTTLES DRAWN AEROBIC AND ANAEROBIC LEFT AC 5 CC   Final   Culture  Setup Time     Final   Value: 10/06/2013 10:33     Performed at [facility]   Culture     Final   Value: GRAM NEGATIVE RODS     Note: Gram Stain Report Called to,Read Back By and Verified With: BECKY DAVIS ON 10/06/2013 AT 8:12P BY WILEJ     Performed at [facility]   Report Status PENDING   Incomplete  CULTURE, BLOOD (ROUTINE X 2)     Status: None   Collection Time    10/06/13  9:05 AM      Result Value Ref Range Status   Specimen Description BLOOD   Final   Special Requests BOTTLES DRAWN AEROBIC AND ANAEROBIC PORT 5 CC   Final   Culture  Setup Time     Final   Value: 10/06/2013 12:38     Performed at [facility]   Culture     Final   Value: GRAM NEGATIVE RODS     Note: Gram Stain Report Called to,Read Back By and Verified With: BECKY DAVIS ON 10/06/2013 AT 8:12P BY WILEJ     Performed at [facility]   Report Status PENDING   Incomplete  MRSA PCR SCREENING     Status: None   Collection Time    10/06/13 11:08 AM      Result Value Ref Range Status   MRSA by PCR NEGATIVE  NEGATIVE Final   Comment:            The GeneXpert MRSA Assay (FDA     approved for NASAL specimens     only), is one component of a     comprehensive MRSA colonization     surveillance program. It is not     intended to diagnose MRSA     infection nor to guide or     monitor treatment for     MRSA infections.    Studies/Results: Dg Abd 1 View  10/06/2013   CLINICAL DATA:  Chronic renal failure. Right ureteral stent. History of lymphoma.  EXAM: ABDOMEN - 1 VIEW  COMPARISON:  CT 06/05/2013.  FINDINGS: Soft tissue structures are unremarkable. Gas pattern nonspecific. Double-J right ureteral stent is noted in good anatomic position. Catheter with tip projected over bladder noted. Degenerative changes lumbar spine and both hips.  IMPRESSION: 1. No acute abnormality. 2. Double-J right ureteral stent in good anatomic position.   Electronically Signed   By: Marcello Moores  Register   On: 10/06/2013 12:37   US  Renal  10/06/2013   CLINICAL DATA:  Evaluate for hydronephrosis. History of right stent placement and stones.  EXAM: RENAL/URINARY TRACT ULTRASOUND COMPLETE  COMPARISON:  09/07/2013  FINDINGS: Right Kidney:  Length: 9.0 cm. Shadowing echogenic foci measuring up to 9 mm in length were noted in the interpolar region. No mass or hydronephrosis visualized.  Left Kidney:  Length: 12.2 cm. Echogenicity within normal limits. No mass or hydronephrosis visualized.  Bladder:  Foley catheter noted.  IMPRESSION: 1. No hydronephrosis. 2. Shadowing echogenic foci in the right kidney may represent nonobstructing stones.   Electronically Signed   By: Logan Bores   On: 10/06/2013 16:02   Dg Chest Port 1 View  10/06/2013   CLINICAL DATA:  Fever  EXAM: PORTABLE CHEST - 1 VIEW  COMPARISON:  09/07/2013  FINDINGS: Do lead pacemaker appears unchanged. Right-sided power port has its tip in the SVC 2 cm above the right atrium, unchanged. Heart size and mediastinal shadows are normal. The lungs remain clear. No effusions.  IMPRESSION: No active disease.   Electronically Signed   By: Nelson Chimes M.D.   On: 10/06/2013 07:28    Assessment/Plan:   Urosepsis on pressure support. Would not recommend proceeding with double-J stent exchange at this time. Renal ultrasound did not show hydronephrosis and therefore obstruction is not appear to be significant component of her urosepsis at this time. Hopefully her clinical situation will improve over the next 48-72 hours and double-J stent to be performed much more safely sometime early next week. If her situation does not improve and her creatinine continues to worsen repeat ultrasound should be performed. If hydronephrosis is noted then a decision will need to be made as to whether a more urgent stent exchange or percutaneous nephrostomy tube should be placed. We'll continue to monitor her for now.   LOS: 1 day   GRAPEY,DAVID S 10/07/2013, 8:07 AM

## 2013-10-08 LAB — BASIC METABOLIC PANEL
BUN: 79 mg/dL — ABNORMAL HIGH (ref 6–23)
CALCIUM: 7.5 mg/dL — AB (ref 8.4–10.5)
CO2: 20 mEq/L (ref 19–32)
Chloride: 92 mEq/L — ABNORMAL LOW (ref 96–112)
Creatinine, Ser: 3.06 mg/dL — ABNORMAL HIGH (ref 0.50–1.10)
GFR calc Af Amer: 16 mL/min — ABNORMAL LOW (ref >90)
GFR calc non Af Amer: 14 mL/min — ABNORMAL LOW (ref >90)
GLUCOSE: 411 mg/dL — AB (ref 70–99)
Potassium: 4.2 mEq/L (ref 3.7–5.3)
Sodium: 132 mEq/L — ABNORMAL LOW (ref 137–147)

## 2013-10-08 LAB — GLUCOSE, CAPILLARY
Glucose-Capillary: 404 mg/dL — ABNORMAL HIGH (ref 70–99)
Glucose-Capillary: 385 mg/dL — ABNORMAL HIGH (ref 70–99)
Glucose-Capillary: 338 mg/dL — ABNORMAL HIGH (ref 70–99)

## 2013-10-08 LAB — CULTURE, BLOOD (ROUTINE X 2)

## 2013-10-08 LAB — URINE CULTURE: Colony Count: >=100000

## 2013-10-08 LAB — CBC
HCT: 25.7 % — ABNORMAL LOW (ref 36.0–46.0)
Hemoglobin: 8.6 g/dL — ABNORMAL LOW (ref 12.0–15.0)
MCH: 29.1 pg (ref 26.0–34.0)
MCHC: 33.5 g/dL (ref 30.0–36.0)
MCV: 86.8 fL (ref 78.0–100.0)
PLATELETS: 13 10*3/uL — AB (ref 150–400)
RBC: 2.96 MIL/uL — ABNORMAL LOW (ref 3.87–5.11)
RDW: 19.9 % — AB (ref 11.5–15.5)
WBC: 6.2 10*3/uL (ref 4.0–10.5)

## 2013-10-08 LAB — PROCALCITONIN: Procalcitonin: 16.52 ng/mL

## 2013-10-08 LAB — PROTIME-INR
INR: 3.98 — ABNORMAL HIGH (ref 0.00–1.49)
Prothrombin Time: 38.8 seconds — ABNORMAL HIGH (ref 11.6–15.2)

## 2013-10-08 MED ORDER — INSULIN GLARGINE 100 UNIT/ML ~~LOC~~ SOLN
15.0000 [IU] | Freq: Every day | SUBCUTANEOUS | Status: DC
  Administered 2013-10-08: 15 [IU] via SUBCUTANEOUS
  Filled 2013-10-08 (×2): qty 0.15

## 2013-10-08 MED ORDER — SODIUM CHLORIDE 0.9 % IJ SOLN: 10.0000 mL | INTRAMUSCULAR | Status: DC | PRN

## 2013-10-08 MED ORDER — INSULIN ASPART 100 UNIT/ML ~~LOC~~ SOLN
0.0000 [IU] | Freq: Three times a day (TID) | SUBCUTANEOUS | Status: DC
  Administered 2013-10-08 (×2): 20 [IU] via SUBCUTANEOUS
  Administered 2013-10-08 – 2013-10-09 (×2): 15 [IU] via SUBCUTANEOUS
  Administered 2013-10-09: 11 [IU] via SUBCUTANEOUS
  Administered 2013-10-09: 20 [IU] via SUBCUTANEOUS
  Administered 2013-10-10: 4 [IU] via SUBCUTANEOUS
  Administered 2013-10-10: 20 [IU] via SUBCUTANEOUS
  Administered 2013-10-10: 11 [IU] via SUBCUTANEOUS
  Administered 2013-10-11: 7 [IU] via SUBCUTANEOUS
  Administered 2013-10-11 (×2): 4 [IU] via SUBCUTANEOUS
  Administered 2013-10-12: 15 [IU] via SUBCUTANEOUS
  Administered 2013-10-12 (×2): 7 [IU] via SUBCUTANEOUS
  Administered 2013-10-13: 15 [IU] via SUBCUTANEOUS
  Administered 2013-10-13 (×2): 7 [IU] via SUBCUTANEOUS
  Administered 2013-10-14: 15 [IU] via SUBCUTANEOUS
  Administered 2013-10-14: 11 [IU] via SUBCUTANEOUS
  Administered 2013-10-14: 4 [IU] via SUBCUTANEOUS
  Administered 2013-10-15: 7 [IU] via SUBCUTANEOUS
  Administered 2013-10-15: 4 [IU] via SUBCUTANEOUS
  Administered 2013-10-15: 7 [IU] via SUBCUTANEOUS
  Administered 2013-10-16: 5 [IU] via SUBCUTANEOUS
  Administered 2013-10-16 (×2): 4 [IU] via SUBCUTANEOUS
  Administered 2013-10-17: 7 [IU] via SUBCUTANEOUS
  Administered 2013-10-17: 11 [IU] via SUBCUTANEOUS
  Administered 2013-10-17: 4 [IU] via SUBCUTANEOUS
  Administered 2013-10-18: 7 [IU] via SUBCUTANEOUS
  Administered 2013-10-18: 3 [IU] via SUBCUTANEOUS

## 2013-10-08 MED ORDER — INSULIN ASPART 100 UNIT/ML ~~LOC~~ SOLN
0.0000 [IU] | Freq: Every day | SUBCUTANEOUS | Status: DC
  Administered 2013-10-08: 3 [IU] via SUBCUTANEOUS
  Administered 2013-10-09: 2 [IU] via SUBCUTANEOUS
  Administered 2013-10-12: 3 [IU] via SUBCUTANEOUS
  Administered 2013-10-13 – 2013-10-14 (×2): 2 [IU] via SUBCUTANEOUS
  Administered 2013-10-15 – 2013-10-18 (×2): 3 [IU] via SUBCUTANEOUS

## 2013-10-08 NOTE — Progress Notes (Signed)
PULMONARY / CRITICAL CARE MEDICINE   Name: [name] MRN: [id number] DOB: [birth year]    ADMISSION DATE:  10/06/2013 CONSULTATION DATE:  10/06/13  REFERRING MD :  Dr. Charlies Silvers  PRIMARY SERVICE: TRH  CHIEF COMPLAINT:  Urosepsis   BRIEF PATIENT DESCRIPTION: 73 y/o F, SNF Resident,  with PMH of HTN, HLD, recurrent lymphoma, COPD, CKD, R ureteral stent placement (4/3) and recent admission for Enterococcus UTI (5/20-5/28) who was admitted on 6/26 with recurrent sepsis / UTI.  PCCM consulted for evaluation.    SIGNIFICANT EVENTS: 10/2010 - Dx with NHL 04/2011 - Chemo R CHOP + XRT with good response 10/2012 - Recurrence, Rx with salvage chemo 05/2013 - Recurrence, CT A/P >> increased LAN with severe R hydro 06/2013 - Admission for sepsis, pyelo 07/14/13 - R ureteral stent placement  5/20-5/28 - Admission for Enterococcus UTI / Sepsis. AKI/CKD- multi-factorial with R-sided hydro, nephrotoxic agents, urosepsis, hypercalcemia, and volume depletion related to diarrhea as well as nephrotoxic agents. Improved with IVF's. Creatinine as high as 7.6 down to 2.58 on d/c. She underwent renal ultrasound which showed right stent in place, no hydronephrosis on the right or the left. ...................................................................................................................................................... 6/26 - Readmit with c/o fever, increased swelling, periods of hypoglycemia.  UTI +, Sepsis   STUDIES:  6/26 renal US >> no hnosis, stent shadow  LINES / TUBES: R CW Port >>>  CULTURES: Hx Enterococcus UTI 5/20>> sens Vanco, nitrofurantioin BCx2 6/26 >> GNR >> UC 6/26 >>GNR >>  ANTIBIOTICS: Rocephin 6/26 >>>x1 Zosyn 6/26 >>> Vanco 6/26 >>> 6/28  SUBJECTIVE: defervesced  On neo gtt -lower dose Denies CP, dyspnea  VITAL SIGNS: Temp:  [96.8 F (36 C)-100.6 F (38.1 C)] 96.8 F (36 C) (06/28 0700) Pulse Rate:  [37-108] 62 (06/28 0700) Resp:  [12-22] 17 (06/28 0700) BP:  (53-134)/(18-93) 95/65 mmHg (06/28 0700) SpO2:  [94 %-100 %] 97 % (06/28 0700) Weight:  [98.2 kg (216 lb 7.9 oz)] 98.2 kg (216 lb 7.9 oz) (06/28 0457)  HEMODYNAMICS:    VENTILATOR SETTINGS:    INTAKE / OUTPUT: Intake/Output     06/27 0701 - 06/28 0700 06/28 0701 - 06/29 0700   P.O. 780    I.V. (mL/kg) 1666.9 (17)    IV Piggyback 350    Total Intake(mL/kg) 2796.9 (28.5)    Urine (mL/kg/hr) 1720 (0.7)    Total Output 1720     Net +1076.9          Stool Occurrence 2 x      PHYSICAL EXAMINATION: General:  Morbidly obese in NAD Neuro:  Awake, alert, MAE, oriented to self but confused otherwise HEENT:  Mm pink/dry, no jvd Cardiovascular:  s1s2 rrr, no m/r/g Lungs:  resp's even/non-labored, lungs bilaterally clear Abdomen:  Obese, soft, bsx4 active Musculoskeletal:  No acute deformities , peripheral pulses + Skin:  Warm/dry, 2+ pitting edema in LE, good cap refill  LABS:  CBC  Recent Labs Lab 10/06/13 1017 10/07/13 0425 10/08/13 0600  WBC 6.2 6.6 6.2  HGB 9.7* 9.7* 8.6*  HCT 29.1* 29.5* 25.7*  PLT 24* 22* 13*   Coag's  Recent Labs Lab 10/06/13 1017 10/07/13 0425 10/08/13 0600  APTT 49*  --   --   INR 2.91* 3.40* 3.98*   BMET  Recent Labs Lab 10/06/13 1017 10/07/13 0425 10/08/13 0600  NA 132* 129* 132*  K 4.7 4.2 4.2  CL 91* 91* 92*  CO2 23 21 20   BUN 71* 68* 79*  CREATININE 2.56* 2.79* 3.06*  GLUCOSE  341* 242* 411*   Electrolytes  Recent Labs Lab 10/06/13 1017 10/07/13 0425 10/08/13 0600  CALCIUM 9.0 8.3* 7.5*  MG 2.0  --   --   PHOS 4.6  --   --    Sepsis Markers  Recent Labs Lab 10/06/13 0713 10/06/13 1017 10/06/13 1450 10/08/13 0600  LATICACIDVEN 1.83  --  4.7*  --   PROCALCITON  --  6.24  --  16.52   ABG No results found for this basename: PHART, PCO2ART, PO2ART,  in the last 168 hours Liver Enzymes  Recent Labs Lab 10/06/13 0658 10/06/13 1017 10/07/13 0425  AST 18 16 72*  ALT 41* 36* 81*  ALKPHOS 107 109 150*   BILITOT 0.4 0.2* 0.5  ALBUMIN 2.1* 2.0* 1.8*   Cardiac Enzymes  Recent Labs Lab 10/06/13 0657  10/06/13 1447 10/06/13 2200 10/07/13 0425  TROPONINI  --   < > <0.30 <0.30 <0.30  PROBNP 8619.0*  --   --   --   --   < > = values in this interval not displayed. Glucose  Recent Labs Lab 10/06/13 2123 10/07/13 0725 10/07/13 1147 10/07/13 1556 10/07/13 2129 10/08/13 0829  GLUCAP 290* 233* 231* 270* 292* 404*    Imaging Dg Abd 1 View  10/06/2013   CLINICAL DATA:  Chronic renal failure. Right ureteral stent. History of lymphoma.  EXAM: ABDOMEN - 1 VIEW  COMPARISON:  CT 06/05/2013.  FINDINGS: Soft tissue structures are unremarkable. Gas pattern nonspecific. Double-J right ureteral stent is noted in good anatomic position. Catheter with tip projected over bladder noted. Degenerative changes lumbar spine and both hips.  IMPRESSION: 1. No acute abnormality. 2. Double-J right ureteral stent in good anatomic position.   Electronically Signed   By: Marcello Moores  Register   On: 10/06/2013 12:37   US Renal  10/06/2013   CLINICAL DATA:  Evaluate for hydronephrosis. History of right stent placement and stones.  EXAM: RENAL/URINARY TRACT ULTRASOUND COMPLETE  COMPARISON:  09/07/2013  FINDINGS: Right Kidney:  Length: 9.0 cm. Shadowing echogenic foci measuring up to 9 mm in length were noted in the interpolar region. No mass or hydronephrosis visualized.  Left Kidney:  Length: 12.2 cm. Echogenicity within normal limits. No mass or hydronephrosis visualized.  Bladder:  Foley catheter noted.  IMPRESSION: 1. No hydronephrosis. 2. Shadowing echogenic foci in the right kidney may represent nonobstructing stones.   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 - d/c sr cr on 6/19 1.7 R Hydronephrosis s/p R ureteral stent placement 4/3 Hyponatremia P:    Urology input regarding stent exchange noted - plan once shock resolved hold allopurinol in setting of recent renal injury Hold lasix, cardizem  GASTROINTESTINAL A:   Hx Constipation P:   Carb modified diet Dulcolax QHS   HEMATOLOGIC A:   Relapsed, 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 Anemia Coagulopathy - on comadin for afib, INR 2.86 on admit P:  HOLD Coumadin  Monitor CBC / INR daily   INFECTIOUS A:   UTI  Septic shock  GNR P:   ABX / Cultures as above Dc vanc    ENDOCRINE A:   Diabetes Mellitus -uncontrolled on steroids Hypothyroidism Gout P:   SSI -resistant scale, add lantus 15 Continue synthroid hold allopurinol   NEUROLOGIC A:   Acute Encephalopathy  P:   Supportive care, promote sleep / wake cycle Minimize sedating medications Mobilize as able   Summary - Resolving Septic shock with lower pressors, avoid fluids since overloaded already  I have personally obtained a history, examined the patient, evaluated laboratory and imaging results, formulated the assessment and plan and placed orders.  Care during the described time interval was provided by me and/or other providers on the critical care team.  I have reviewed this patient's available data, including medical history, events of note, physical examination and test results as part of my evaluation  CC time x 15m  Kara Mead MD. FCCP. [facility] Pager [phone] If no response call [phone]     10/08/2013, 9:50 AM

## 2013-10-08 NOTE — Progress Notes (Signed)
2 Days Post-Op Subjective: Patient reports no new complaints concerns. She denies any current abdominal or flank pain. She remains on neo-4 pressure support. She is continued to be intermittently febrile with a MAXIMUM TEMPERATURE of 101.7. Hemodynamically improved. Creatinine continues to worsen somewhat probably secondary to ATN from urosepsis and hypotension rather than obstruction.   Objective: Vital signs in last 24 hours: Temp:  [96.4 F (35.8 C)-100 F (37.8 C)] 96.8 F (36 C) (06/28 1100) Pulse Rate:  [37-119] 90 (06/28 1100) Resp:  [12-22] 17 (06/28 1100) BP: (53-134)/(18-93) 104/64 mmHg (06/28 1100) SpO2:  [94 %-100 %] 98 % (06/28 1100) Weight:  [216 lb 7.9 oz (98.2 kg)] 216 lb 7.9 oz (98.2 kg) (06/28 0457)  Intake/Output from previous day: 06/27 0701 - 06/28 0700 In: 2858.2 [P.O.:780; I.V.:1728.2; IV Piggyback:350] Out: 1720 [Urine:1720] Intake/Output this shift: Total I/O In: 255.2 [I.V.:205.2; IV Piggyback:50] Out: -   Physical Exam:  Constitutional: Vital signs reviewed. WD WN in NAD   Eyes: PERRL, No scleral icterus.   Cardiovascular: RRR Pulmonary/Chest: Normal effort Abdominal: Soft. Non-tender, non-distended, bowel sounds are normal, no masses, organomegaly, or guarding present.  Extremities: No cyanosis or edema   Lab Results:  Recent Labs  10/06/13 1017 10/07/13 0425 10/08/13 0600  HGB 9.7* 9.7* 8.6*  HCT 29.1* 29.5* 25.7*   BMET  Recent Labs  10/07/13 0425 10/08/13 0600  NA 129* 132*  K 4.2 4.2  CL 91* 92*  CO2 21 20  GLUCOSE 242* 411*  BUN 68* 79*  CREATININE 2.79* 3.06*  CALCIUM 8.3* 7.5*    Recent Labs  10/06/13 1017 10/07/13 0425 10/08/13 0600  INR 2.91* 3.40* 3.98*   No results found for this basename: LABURIN,  in the last 72 hours Results for orders placed during the hospital encounter of 10/06/13  CULTURE, BLOOD (ROUTINE X 2)     Status: None   Collection Time    10/06/13  6:58 AM      Result Value Ref Range Status    Specimen Description BLOOD   Final   Special Requests BOTTLES DRAWN AEROBIC AND ANAEROBIC LEFT AC 5 CC   Final   Culture  Setup Time     Final   Value: 10/06/2013 10:33     Performed at [facility]   Culture     Final   Value: KLEBSIELLA PNEUMONIAE     Note: Gram Stain Report Called to,Read Back By and Verified With: BECKY DAVIS ON 10/06/2013 AT 8:12P BY WILEJ     Performed at [facility]   Report Status 10/08/2013 FINAL   Final   Organism ID, Bacteria KLEBSIELLA PNEUMONIAE   Final  URINE CULTURE     Status: None   Collection Time    10/06/13  7:55 AM      Result Value Ref Range Status   Specimen Description URINE, RANDOM   Final   Special Requests NONE   Final   Culture  Setup Time     Final   Value: 10/06/2013 10:41     Performed at [place]     Final   Value: >=100,000 COLONIES/ML     Performed at [facility]   Culture     Final   Value: Rico     Performed at [facility]   Report Status PENDING   Incomplete  CULTURE, BLOOD (ROUTINE X 2)     Status: None   Collection Time  10/06/13  9:05 AM      Result Value Ref Range Status   Specimen Description BLOOD   Final   Special Requests BOTTLES DRAWN AEROBIC AND ANAEROBIC PORT 5 CC   Final   Culture  Setup Time     Final   Value: 10/06/2013 12:38     Performed at [facility]   Culture     Final   Value: KLEBSIELLA PNEUMONIAE     Note: SUSCEPTIBILITIES PERFORMED ON PREVIOUS CULTURE WITHIN THE LAST 5 DAYS.     Note: Gram Stain Report Called to,Read Back By and Verified With: BECKY DAVIS ON 10/06/2013 AT 8:12P BY Dennard Nip     Performed at [facility]   Report Status 10/08/2013 FINAL   Final  MRSA PCR SCREENING     Status: None   Collection Time    10/06/13 11:08 AM      Result Value Ref Range Status   MRSA by PCR NEGATIVE  NEGATIVE Final   Comment:            The GeneXpert MRSA Assay (FDA     approved for NASAL specimens     only),  is one component of a     comprehensive MRSA colonization     surveillance program. It is not     intended to diagnose MRSA     infection nor to guide or     monitor treatment for     MRSA infections.    Studies/Results: Dg Abd 1 View  10/06/2013   CLINICAL DATA:  Chronic renal failure. Right ureteral stent. History of lymphoma.  EXAM: ABDOMEN - 1 VIEW  COMPARISON:  CT 06/05/2013.  FINDINGS: Soft tissue structures are unremarkable. Gas pattern nonspecific. Double-J right ureteral stent is noted in good anatomic position. Catheter with tip projected over bladder noted. Degenerative changes lumbar spine and both hips.  IMPRESSION: 1. No acute abnormality. 2. Double-J right ureteral stent in good anatomic position.   Electronically Signed   By: Marcello Moores  Register   On: 10/06/2013 12:37   US Renal  10/06/2013   CLINICAL DATA:  Evaluate for hydronephrosis. History of right stent placement and stones.  EXAM: RENAL/URINARY TRACT ULTRASOUND COMPLETE  COMPARISON:  09/07/2013  FINDINGS: Right Kidney:  Length: 9.0 cm. Shadowing echogenic foci measuring up to 9 mm in length were noted in the interpolar region. No mass or hydronephrosis visualized.  Left Kidney:  Length: 12.2 cm. Echogenicity within normal limits. No mass or hydronephrosis visualized.  Bladder:  Foley catheter noted.  IMPRESSION: 1. No hydronephrosis. 2. Shadowing echogenic foci in the right kidney may represent nonobstructing stones.   Electronically Signed   By: Logan Bores   On: 10/06/2013 16:02    Assessment/Plan:   Urosepsis. Some clinical improvement. Renal function continues to worsen but hopefully will start to improve with ongoing supportive care and more hemodynamic stability. Her creatinine continues to worsen repeat renal ultrasound should be considered to be sure that there is not an obstructive component to her renal failure. At this point there were be little utility and replaced her double-J stent. I would anticipate attempting  to do so later this coming week 1 she is out of the intensive care unit and has improved from her critical illness.   LOS: 2 days   GRAPEY,DAVID S 10/08/2013, 11:53 AM

## 2013-10-09 ENCOUNTER — Encounter (HOSPITAL_COMMUNITY): Payer: Self-pay | Admitting: Pulmonary Disease

## 2013-10-09 ENCOUNTER — Other Ambulatory Visit: Payer: Self-pay | Admitting: Urology

## 2013-10-09 LAB — COMPREHENSIVE METABOLIC PANEL
ALBUMIN: 1.4 g/dL — AB (ref 3.5–5.2)
ALK PHOS: 107 U/L (ref 39–117)
ALT: 92 U/L — ABNORMAL HIGH (ref 0–35)
AST: 52 U/L — ABNORMAL HIGH (ref 0–37)
BUN: 85 mg/dL — AB (ref 6–23)
CALCIUM: 7 mg/dL — AB (ref 8.4–10.5)
CO2: 22 mEq/L (ref 19–32)
Chloride: 92 mEq/L — ABNORMAL LOW (ref 96–112)
Creatinine, Ser: 3.04 mg/dL — ABNORMAL HIGH (ref 0.50–1.10)
GFR calc Af Amer: 17 mL/min — ABNORMAL LOW (ref >90)
GFR calc non Af Amer: 14 mL/min — ABNORMAL LOW (ref >90)
GLUCOSE: 319 mg/dL — AB (ref 70–99)
POTASSIUM: 3.9 meq/L (ref 3.7–5.3)
SODIUM: 133 meq/L — AB (ref 137–147)
Total Bilirubin: 0.2 mg/dL — ABNORMAL LOW (ref 0.3–1.2)
Total Protein: 4.8 g/dL — ABNORMAL LOW (ref 6.0–8.3)

## 2013-10-09 LAB — GLUCOSE, CAPILLARY
GLUCOSE-CAPILLARY: 271 mg/dL — AB (ref 70–99)
GLUCOSE-CAPILLARY: 220 mg/dL — AB (ref 70–99)
Glucose-Capillary: 260 mg/dL — ABNORMAL HIGH (ref 70–99)
Glucose-Capillary: 332 mg/dL — ABNORMAL HIGH (ref 70–99)
Glucose-Capillary: 358 mg/dL — ABNORMAL HIGH (ref 70–99)

## 2013-10-09 LAB — CBC
HCT: 22.6 % — ABNORMAL LOW (ref 36.0–46.0)
Hemoglobin: 7.7 g/dL — ABNORMAL LOW (ref 12.0–15.0)
MCH: 29.3 pg (ref 26.0–34.0)
MCHC: 34.1 g/dL (ref 30.0–36.0)
MCV: 85.9 fL (ref 78.0–100.0)
Platelets: 14 10*3/uL — CL (ref 150–400)
RBC: 2.63 MIL/uL — ABNORMAL LOW (ref 3.87–5.11)
RDW: 19.6 % — AB (ref 11.5–15.5)
WBC: 4 10*3/uL (ref 4.0–10.5)

## 2013-10-09 LAB — PROTIME-INR
INR: 3.6 — ABNORMAL HIGH (ref 0.00–1.49)
PROTHROMBIN TIME: 35.9 s — AB (ref 11.6–15.2)

## 2013-10-09 MED ORDER — INSULIN GLARGINE 100 UNIT/ML ~~LOC~~ SOLN
20.0000 [IU] | Freq: Every day | SUBCUTANEOUS | Status: DC
  Administered 2013-10-09 – 2013-10-13 (×4): 20 [IU] via SUBCUTANEOUS
  Filled 2013-10-09 (×5): qty 0.2

## 2013-10-09 MED ORDER — DEXTROSE 5 % IV SOLN
1.0000 g | INTRAVENOUS | Status: DC
  Administered 2013-10-09 – 2013-10-10 (×2): 1 g via INTRAVENOUS
  Filled 2013-10-09 (×3): qty 10

## 2013-10-09 MED ORDER — PHYTONADIONE 5 MG PO TABS
5.0000 mg | ORAL_TABLET | Freq: Once | ORAL | Status: AC
  Administered 2013-10-09: 5 mg via ORAL
  Filled 2013-10-09: qty 1

## 2013-10-09 NOTE — Progress Notes (Signed)
CARE MANAGEMENT NOTE 10/09/2013  Patient:  [name], [name]   Account Number:  [id number]  Date Initiated:  10/06/2013  Documentation initiated by:  Kleber Crean  Subjective/Objective Assessment:   urinary sepsis, hx of lymphoma,temp 100.4/hypotensive -iv f;d bolus.     Action/Plan:   assited living at [facility]   Anticipated DC Date:  10/12/2013   Anticipated DC Plan:  [place]  In-house referral  Clinical Social Worker      DC Planning Services  NA      [facility] Choice  NA   Choice offered to / List presented to:  NA   DME arranged  NA      DME agency  NA     [place] arranged  NA      [place] agency  NA   Status of service:  In process, will continue to follow Medicare Important Message given?  NA - LOS <3 / Initial given by admissions (If response is "NO", the following Medicare IM given date fields will be blank) Date Medicare IM given:   Date Additional Medicare IM given:    Discharge Disposition:    Per UR Regulation:  Reviewed for med. necessity/level of care/duration of stay  If discussed at Long Length of Stay Meetings, dates discussed:    Comments:  [id number]/Dana Staheli,RN,BSn,CCM: Patient with uro and systemic sepsis.  Iv vasopressors, urinary stents to be replaced/urine and blood cultures + klebsielle [place]/remains in critical condition/  Muir, [place], [place], [place] [phone] Chart Reviewed for discharge and hospital needs. Discharge needs at time of review: None present will follow for needs. IM UPDATE NEEDED ON [id number] IF REMAINS INPATIENT Review of patient progress due on [id number].

## 2013-10-09 NOTE — Progress Notes (Signed)
Inpatient Diabetes Program Recommendations  AACE/ADA: New Consensus Statement on Inpatient Glycemic Control (2013)  Target Ranges:  Prepandial:   less than 140 mg/dL      Peak postprandial:   less than 180 mg/dL (1-2 hours)      Critically ill patients:  140 - 180 mg/dL   Reason for Visit: Hyperglycemia  Results for [name], [name] (MRN [id number]) as of 10/09/2013 12:30  Ref. Range 10/08/2013 08:29 10/08/2013 12:41 10/08/2013 17:04 10/08/2013 22:23 10/09/2013 08:18  Glucose-Capillary Latest Range: 70-99 mg/dL 404 (H) 385 (H) 338 (H) 260 (H) 332 (H)   Results for [name], [name] (MRN [id number]) as of 10/09/2013 12:30  Ref. Range 10/09/2013 04:00  Sodium Latest Range: 136-145 mEq/L 133 (L)  Potassium Latest Range: 3.5-5.1 mEq/L 3.9  Chloride Latest Range: 96-112 mEq/L 92 (L)  CO2 Latest Range: 19-32 mEq/L 22  BUN Latest Range: 7.0-26.0 mg/dL 85 (H)  Creatinine Latest Range: 0.50-1.10 mg/dL 3.04 (H)  Calcium Latest Range: 8.4-10.5 mg/dL 7.0 (L)  GFR calc non Af Amer Latest Range: >90 mL/min 14 (L)  GFR calc Af Amer Latest Range: >90 mL/min 17 (L)  Glucose Latest Range: 70-99 mg/dl 319 (H)   Steroid-induced hyperglycemia.  For surgery on Wed and will be NPO. Would benefit from increasing basal insulin, along with moderate correction.  Inpatient Diabetes Program Recommendations Insulin - Basal: Lantus increased to 20 units this am.  Correction (SSI): Increase Novolog to moderate tidwc and hs. Will need to make Novolog Q4H when NPO  Note: Will follow closely. Thank you. Lorenda Peck, RD, LDN, CDE Inpatient Diabetes Coordinator [phone]

## 2013-10-09 NOTE — Progress Notes (Signed)
PULMONARY / CRITICAL CARE MEDICINE   Name: [name] MRN: [id number] DOB: 10-Nov-1940    ADMISSION DATE:  10/06/2013 CONSULTATION DATE:  10/06/13  REFERRING MD :  Dr. Charlies Silvers  PRIMARY SERVICE: TRH  CHIEF COMPLAINT:  Urosepsis   BRIEF PATIENT DESCRIPTION: 73 y/o F, SNF Resident,  with PMH of HTN, HLD, recurrent lymphoma, COPD, CKD, R ureteral stent placement (4/3) and recent admission for Enterococcus UTI (5/20-5/28) who was admitted on 6/26 with recurrent sepsis / UTI.  PCCM consulted for evaluation.    SIGNIFICANT EVENTS: 10/2010 - Dx with NHL 04/2011 - Chemo R CHOP + XRT with good response 10/2012 - Recurrence, Rx with salvage chemo 05/2013 - Recurrence, CT A/P >> increased LAN with severe R hydro 06/2013 - Admission for sepsis, pyelo 07/14/13 - R ureteral stent placement  5/20-5/28 - Admission for Enterococcus UTI / Sepsis. AKI/CKD- multi-factorial with R-sided hydro, nephrotoxic agents, urosepsis, hypercalcemia, and volume depletion related to diarrhea as well as nephrotoxic agents. Improved with IVF's. Creatinine as high as 7.6 down to 2.58 on d/c. She underwent renal ultrasound which showed right stent in place, no hydronephrosis on the right or the left. ...................................................................................................................................................... 6/26 - Readmit with c/o fever, increased swelling, periods of hypoglycemia.  UTI +, Sepsis 6/29 - Resolved sepsis, off pressors.  Significant thrombocytopenia   STUDIES:  6/26 renal US >> no hydronephrosis, stent shadow  LINES / TUBES: R CW Port >>>  CULTURES: Hx Enterococcus UTI 5/20>> sens Vanco, nitrofurantioin BCx2 6/26 >>klebsiella>>sens zosyn UC 6/26 >>klebsiella>>sens zosyn   ANTIBIOTICS: Rocephin 6/26 >>>x1 Zosyn 6/26 >>>6/29 Vanco 6/26 >>> 6/28 Rocephin 6/29 >>   SUBJECTIVE: RN reports pt weaned off neo since 0200 6/29.  No acute events.    VITAL SIGNS: Temp:   [96.4 F (35.8 C)-97.5 F (36.4 C)] 96.8 F (36 C) (06/29 0700) Pulse Rate:  [69-104] 70 (06/29 0700) Resp:  [11-21] 12 (06/29 0700) BP: (76-151)/(30-93) 119/63 mmHg (06/29 0700) SpO2:  [96 %-99 %] 98 % (06/29 0700) Weight:  [222 lb 0.1 oz (100.7 kg)] 222 lb 0.1 oz (100.7 kg) (06/29 0500)  INTAKE / OUTPUT: Intake/Output     06/28 0701 - 06/29 0700 06/29 0701 - 06/30 0700   P.O. 1050    I.V. (mL/kg) 420.6 (4.2) 10 (0.1)   IV Piggyback 200    Total Intake(mL/kg) 1670.6 (16.6) 10 (0.1)   Urine (mL/kg/hr) 1095 (0.5) 350 (2.1)   Total Output 1095 350   Net +575.6 -340          PHYSICAL EXAMINATION: General:  Morbidly obese in NAD Neuro:  Awake, alert, MAE, oriented to self but confused otherwise, very talkative HEENT:  Mm pink/dry, no jvd Cardiovascular:  s1s2 rrr, no m/r/g Lungs:  resp's even/non-labored, lungs bilaterally clear Abdomen:  Obese, soft, bsx4 active Musculoskeletal:  No acute deformities , peripheral pulses + Skin:  Warm/dry, 2+ pitting edema in LE, good cap refill  LABS:  CBC  Recent Labs Lab 10/07/13 0425 10/08/13 0600 10/09/13 0400  WBC 6.6 6.2 4.0  HGB 9.7* 8.6* 7.7*  HCT 29.5* 25.7* 22.6*  PLT 22* 13* 14*   Coag's  Recent Labs Lab 10/06/13 1017 10/07/13 0425 10/08/13 0600 10/09/13 0400  APTT 49*  --   --   --   INR 2.91* 3.40* 3.98* 3.60*   BMET  Recent Labs Lab 10/07/13 0425 10/08/13 0600 10/09/13 0400  NA 129* 132* 133*  K 4.2 4.2 3.9  CL 91* 92* 92*  CO2 21 20 22  BUN 68* 79* 85*  CREATININE 2.79* 3.06* 3.04*  GLUCOSE 242* 411* 319*   Electrolytes  Recent Labs Lab 10/06/13 1017 10/07/13 0425 10/08/13 0600 10/09/13 0400  CALCIUM 9.0 8.3* 7.5* 7.0*  MG 2.0  --   --   --   PHOS 4.6  --   --   --    Sepsis Markers  Recent Labs Lab 10/06/13 0713 10/06/13 1017 10/06/13 1450 10/08/13 0600  LATICACIDVEN 1.83  --  4.7*  --   PROCALCITON  --  6.24  --  16.52   ABG No results found for this basename: PHART,  PCO2ART, PO2ART,  in the last 168 hours Liver Enzymes  Recent Labs Lab 10/06/13 1017 10/07/13 0425 10/09/13 0400  AST 16 72* 52*  ALT 36* 81* 92*  ALKPHOS 109 150* 107  BILITOT 0.2* 0.5 0.2*  ALBUMIN 2.0* 1.8* 1.4*   Cardiac Enzymes  Recent Labs Lab 10/06/13 0657  10/06/13 1447 10/06/13 2200 10/07/13 0425  TROPONINI  --   < > <0.30 <0.30 <0.30  PROBNP 8619.0*  --   --   --   --   < > = values in this interval not displayed. Glucose  Recent Labs Lab 10/07/13 2129 10/08/13 0829 10/08/13 1241 10/08/13 1704 10/08/13 2223 10/09/13 0818  GLUCAP 292* 404* 385* 338* 260* 332*    Imaging No results found.  ASSESSMENT / PLAN:  PULMONARY A: COPD - without acute exacerbation  P:   Pulmonary hygiene / mobilize PRN BD's Continue spiriva DNI  CARDIOVASCULAR A:  Septic shock - secondary to klebsiella bacteremia / UTI.  Vasopressors weaned off 6/9 0200 Atrial Fibrillation Sick Sinus Syndrome s/p Pacemaker  Baseline Steroid Dependent  P:  Hold coumadin for now, may need to convert to heparin gtt once INR < 2.0 for possible procedures (stent removal) Stress steroids, consider wean to off am 6/30 Continue zocor Will need diuresis for volume removal prior to discharge.   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 - sr cr on 6/19 1.7 R Hydronephrosis s/p R ureteral stent placement 4/3 Hyponatremia P:   Urology input regarding stent exchange noted - planned exchange 71 Hold allopurinol in setting of recent renal injury Hold lasix, cardizem  GASTROINTESTINAL A:   Hx Constipation P:   Carb modified diet NPO p MN on 6/29 Dulcolax QHS   HEMATOLOGIC / ONC  A:   Relapsed, Refractory high-grade Non-Hodgkin's B-Cell Lymphoma - on salvage therapy of Rituxan + Revlimid as 4th line Rx Anemia Coagulopathy - on comadin for afib, INR 2.86 on admit Thrombocytopenia - platelets ranging in teens, likely secondary to DIC P:  HOLD Coumadin, see above regarding  heparin gtt Monitor CBC / INR daily Platelet administration for platelets less than 10 (per ONC).   Vitamin K 5mg  x1 6/29 Monitor for spontaneous bleeding  Hold further chemo in setting of sepsis   INFECTIOUS A:   Klebsiella UTI  Septic shock - secondary to klebsiella bacteremia, UTI P:   ABX / Cultures as above   ENDOCRINE A:   Diabetes Mellitus - uncontrolled on steroids Hypothyroidism Gout P:   SSI - resistant scale Increase lantus to 20 units 6/29 >>planned OR 7/1, consider D5 to avoid hypoglycemia.  Currently on steroids.   Continue synthroid Hold allopurinol   NEUROLOGIC A:   Acute Encephalopathy  P:   Supportive care, promote sleep / wake cycle Minimize sedating medications Mobilize as able   Noe Gens, NP-C [facility] Pgr: [phone] or [phone]  Attending:  I have personally obtained a history, examined the patient, evaluated laboratory and imaging results, formulated the assessment and plan and placed orders.  Clinically improving but remains critically ill, needs stent replacement, will need to coordinate transfusions with urology.  Niece (HCPOA) updated at bedside.  Care during the described time interval was provided by me and/or other providers on the critical care team.  I have reviewed this patient's available data, including medical history, events of note, physical examination and test results as part of my evaluation  CC time x 69min  Roselie Awkward, MD [facility] Pager: [phone] Cell: [phone] If no response, call [phone]   10/09/2013, 8:40 AM

## 2013-10-09 NOTE — Progress Notes (Signed)
ANTIBIOTIC CONSULT NOTE - FOLLOW UP  Pharmacy Consult for Zosyn Indication: urosepsis  Allergies  Allergen Reactions  . Codeine Nausea Only  . Penicillins Other (See Comments)    Unknown    Patient Measurements: Height: 4\' 11"  (149.9 cm) Weight: 222 lb 0.1 oz (100.7 kg) IBW/kg (Calculated) : 43.2   Vital Signs: Temp: 97 [name] (36.1 C) (06/29 0800) Temp src: Core (Comment) (06/29 0800) BP: 119/63 mmHg (06/29 0700) Pulse Rate: 70 (06/29 0700) Intake/Output from previous day: 06/28 0701 - 06/29 0700 In: 1670.6 [P.O.:1050; I.V.:420.6; IV Piggyback:200] Out: 1095 [Urine:1095] Intake/Output from this shift: Total I/O In: 10 [I.V.:10] Out: 350 [Urine:350]  Labs:  Recent Labs  10/07/13 0425 10/08/13 0600 10/09/13 0400  WBC 6.6 6.2 4.0  HGB 9.7* Dana.6* 7.7*  PLT 22* 13* 14*  CREATININE 2.79* 3.06* 3.04*   Estimated Creatinine Clearance: 17.5 ml/min (by C-G formula based on Cr of 3.04).    Microbiology: Recent Results (from the past 720 hour(s))  CULTURE, BLOOD (ROUTINE X 2)     Status: None   Collection Time    10/06/13  6:58 AM      Result Value Ref Range Status   Specimen Description BLOOD   Final   Special Requests BOTTLES DRAWN AEROBIC AND ANAEROBIC LEFT AC 5 CC   Final   Culture  Setup Time     Final   Value: 10/06/2013 10:33     Performed at [facility]   Culture     Final   Value: KLEBSIELLA PNEUMONIAE     Note: Gram Stain Report Called to,Read Back By and Verified With: BECKY DAVIS ON 10/06/2013 AT Dana:12P BY WILEJ     Performed at [facility]   Report Status 10/08/2013 FINAL   Final   Organism ID, Bacteria KLEBSIELLA PNEUMONIAE   Final  URINE CULTURE     Status: None   Collection Time    10/06/13  7:55 AM      Result Value Ref Range Status   Specimen Description URINE, RANDOM   Final   Special Requests NONE   Final   Culture  Setup Time     Final   Value: 10/06/2013 10:41     Performed at [facility]     Final    Value: >=100,000 COLONIES/ML     Performed at [facility]   Culture     Final   Value: KLEBSIELLA PNEUMONIAE     Performed at [facility]   Report Status 10/08/2013 FINAL   Final   Organism ID, Bacteria KLEBSIELLA PNEUMONIAE   Final  CULTURE, BLOOD (ROUTINE X 2)     Status: None   Collection Time    10/06/13  9:05 AM      Result Value Ref Range Status   Specimen Description BLOOD   Final   Special Requests BOTTLES DRAWN AEROBIC AND ANAEROBIC PORT 5 CC   Final   Culture  Setup Time     Final   Value: 10/06/2013 12:38     Performed at [facility]   Culture     Final   Value: KLEBSIELLA PNEUMONIAE     Note: SUSCEPTIBILITIES PERFORMED ON PREVIOUS CULTURE WITHIN THE LAST 5 DAYS.     Note: Gram Stain Report Called to,Read Back By and Verified With: BECKY DAVIS ON 10/06/2013 AT Dana:12P BY Dennard Nip     Performed at [facility]   Report Status 10/08/2013 FINAL  Final  MRSA PCR SCREENING     Status: None   Collection Time    10/06/13 11:08 AM      Result Value Ref Range Status   MRSA by PCR NEGATIVE  NEGATIVE Final   Comment:            The GeneXpert MRSA Assay (FDA     approved for NASAL specimens     only), is one component of a     comprehensive MRSA colonization     surveillance program. It is not     intended to diagnose MRSA     infection nor to guide or     monitor treatment for     MRSA infections.    Anti-infectives   Start     Dose/Rate Route Frequency Ordered Stop   10/09/13 1000  vancomycin (VANCOCIN) IVPB 1000 mg/200 mL premix  Status:  Discontinued     1,000 mg 200 mL/hr over 60 Minutes Intravenous Every 48 hours 10/07/13 1253 10/08/13 1248   10/07/13 2200  piperacillin-tazobactam (ZOSYN) IVPB 2.25 g     2.25 g 100 mL/hr over 30 Minutes Intravenous 4 times per day 10/07/13 1403     10/07/13 1600  piperacillin-tazobactam (ZOSYN) IVPB 2.25 g  Status:  Discontinued     2.25 g 100 mL/hr over 30 Minutes Intravenous 4 times per day  10/07/13 1253 10/07/13 1403   10/07/13 0800  cefTRIAXone (ROCEPHIN) 1 g in dextrose 5 % 50 mL IVPB  Status:  Discontinued     1 g 100 mL/hr over 30 Minutes Intravenous Every 24 hours 10/06/13 0923 10/06/13 1107   10/06/13 1400  piperacillin-tazobactam (ZOSYN) IVPB 3.375 g  Status:  Discontinued     3.375 g 12.5 mL/hr over 240 Minutes Intravenous Every Dana hours 10/06/13 1146 10/07/13 1253   10/06/13 1000  vancomycin (VANCOCIN) IVPB 1000 mg/200 mL premix  Status:  Discontinued     1,000 mg 200 mL/hr over 60 Minutes Intravenous Every 24 hours 10/06/13 0928 10/07/13 1253   10/06/13 0845  cefTRIAXone (ROCEPHIN) 1 g in dextrose 5 % 50 mL IVPB  Status:  Discontinued     1 g 100 mL/hr over 30 Minutes Intravenous Every 24 hours 10/06/13 0837 10/06/13 0839   10/06/13 0845  cefTRIAXone (ROCEPHIN) 2 g in dextrose 5 % 50 mL IVPB     2 g 100 mL/hr over 30 Minutes Intravenous  Once 10/06/13 3244 10/06/13 0955      Assessment: 73 y/o [name] on salvage chemotherapy for recurrent NHL, admitted 6/24 with urosepsis.  Empiric antibiotics were started with pharmacy dosing assistance requested.  Antibiotic therapy was subsequently adjusted to Zosyn monotherapy after blood and urine cultures grew Klebsiella pneumoniae.  6/29: D#4 Zosyn 2.25 grams IV q6h SCr remains elevated, stable Estimated CrCl < 20 mL/min Blood and urine cultures growing Klebsiella pneumoniae S to all abx tested except ampicillin and nitrofurantoin.   Goal of Therapy:  Appropriate antibiotic dosing for renal function; eradication of infection.   Plan:  1. Continue present Zosyn dosage for now (2.25 grams IV q6h) 2. Consider possible de-escalation of abx to ceftriaxone or cefazolin today - await input from attending MD.  Clayburn Pert, PharmD, BCPS Pager: [phone] 10/09/2013  9:14 AM

## 2013-10-09 NOTE — Progress Notes (Signed)
[name]   DOB:July 14, 1940   IW#:[id number]   GNO#:[id number]  Subjective:No acute overnight events noted.  Off pressors since 2 am per nursing.    Objective:  Filed Vitals:   10/09/13 0700  BP: 119/63  Pulse: 70  Temp: 96.8 F (36 C)  Resp: 12    Body mass index is 44.82 kg/(m^2).  Intake/Output Summary (Last 24 hours) at 10/09/13 0808 Last data filed at 10/09/13 0200  Gross per 24 hour  Intake 1549.3 ml  Output    945 ml  Net  604.3 ml    Oriented to self. Pale  Sclerae unicteric  Oropharynx clear  No peripheral adenopathy  Lungs clear -- no rales or rhonchi  Regular rate and rhythm S1 S2 without murmurs  Abdomen obese, NTTP +BS  MSK 2+ edema bilaterally  Neuro, moves all her extremities.     CBG (last 3)   Recent Labs  10/08/13 1241 10/08/13 1704 10/08/13 2223  GLUCAP 385* 338* 260*     Labs:  Lab Results  Component Value Date   WBC 4.0 10/09/2013   HGB 7.7* 10/09/2013   HCT 22.6* 10/09/2013   MCV 85.9 10/09/2013   PLT 14* 10/09/2013   NEUTROABS 5.8 1/69/4503    Basic Metabolic Panel:  Recent Labs Lab 10/06/13 0658 10/06/13 1017 10/07/13 0425 10/08/13 0600 10/09/13 0400  NA 129* 132* 129* 132* 133*  K 4.8 4.7 4.2 4.2 3.9  CL 89* 91* 91* 92* 92*  CO2 21 23 21 20 22   GLUCOSE 327* 341* 242* 411* 319*  BUN 71* 71* 68* 79* 85*  CREATININE 2.43* 2.56* 2.79* 3.06* 3.04*  CALCIUM 9.3 9.0 8.3* 7.5* 7.0*  MG  --  2.0  --   --   --   PHOS  --  4.6  --   --   --    GFR Estimated Creatinine Clearance: 17.5 ml/min (by C-G formula based on Cr of 3.04). Liver Function Tests:  Recent Labs Lab 10/06/13 0658 10/06/13 1017 10/07/13 0425 10/09/13 0400  AST 18 16 72* 52*  ALT 41* 36* 81* 92*  ALKPHOS 107 109 150* 107  BILITOT 0.4 0.2* 0.5 0.2*  PROT 6.0 5.5* 5.5* 4.8*  ALBUMIN 2.1* 2.0* 1.8* 1.4*    Recent Labs Lab 10/06/13 0658  LIPASE 34   No results found for this basename: AMMONIA,  in the last 168 hours Coagulation profile  Recent  Labs Lab 10/06/13 0658 10/06/13 1017 10/07/13 0425 10/08/13 0600 10/09/13 0400  INR 2.86* 2.91* 3.40* 3.98* 3.60*    CBC:  Recent Labs Lab 10/06/13 0658 10/06/13 1017 10/07/13 0425 10/08/13 0600 10/09/13 0400  WBC 7.2 6.2 6.6 6.2 4.0  NEUTROABS 6.6 5.8  --   --   --   HGB 10.4* 9.7* 9.7* 8.6* 7.7*  HCT 31.1* 29.1* 29.5* 25.7* 22.6*  MCV 88.6 88.7 88.3 86.8 85.9  PLT PLATELET CLUMPS NOTED ON SMEAR, UNABLE TO ESTIMATE 24* 22* 13* 14*   CBG:  Recent Labs Lab 10/07/13 2129 10/08/13 0829 10/08/13 1241 10/08/13 1704 10/08/13 2223  GLUCAP 292* 404* 385* 338* 260*   D-Dimer  Recent Labs  10/06/13 1430  DDIMER 2.83*   Hgb A1c  Recent Labs  10/06/13 1017  HGBA1C 8.5*   Microbiology Recent Results (from the past 240 hour(s))  CULTURE, BLOOD (ROUTINE X 2)     Status: None   Collection Time    10/06/13  6:58 AM      Result Value Ref Range Status  Specimen Description BLOOD   Final   Special Requests BOTTLES DRAWN AEROBIC AND ANAEROBIC LEFT AC 5 CC   Final   Culture  Setup Time     Final   Value: 10/06/2013 10:33     Performed at [facility]   Culture     Final   Value: KLEBSIELLA PNEUMONIAE     Note: Gram Stain Report Called to,Read Back By and Verified With: BECKY DAVIS ON 10/06/2013 AT 8:12P BY WILEJ     Performed at [facility]   Report Status 10/08/2013 FINAL   Final   Organism ID, Bacteria KLEBSIELLA PNEUMONIAE   Final  URINE CULTURE     Status: None   Collection Time    10/06/13  7:55 AM      Result Value Ref Range Status   Specimen Description URINE, RANDOM   Final   Special Requests NONE   Final   Culture  Setup Time     Final   Value: 10/06/2013 10:41     Performed at [facility]     Final   Value: >=100,000 COLONIES/ML     Performed at [facility]   Culture     Final   Value: KLEBSIELLA PNEUMONIAE     Performed at [facility]   Report Status 10/08/2013 FINAL   Final   Organism  ID, Bacteria KLEBSIELLA PNEUMONIAE   Final  CULTURE, BLOOD (ROUTINE X 2)     Status: None   Collection Time    10/06/13  9:05 AM      Result Value Ref Range Status   Specimen Description BLOOD   Final   Special Requests BOTTLES DRAWN AEROBIC AND ANAEROBIC PORT 5 CC   Final   Culture  Setup Time     Final   Value: 10/06/2013 12:38     Performed at [facility]   Culture     Final   Value: KLEBSIELLA PNEUMONIAE     Note: SUSCEPTIBILITIES PERFORMED ON PREVIOUS CULTURE WITHIN THE LAST 5 DAYS.     Note: Gram Stain Report Called to,Read Back By and Verified With: BECKY DAVIS ON 10/06/2013 AT 8:12P BY Dennard Nip     Performed at [facility]   Report Status 10/08/2013 FINAL   Final  MRSA PCR SCREENING     Status: None   Collection Time    10/06/13 11:08 AM      Result Value Ref Range Status   MRSA by PCR NEGATIVE  NEGATIVE Final   Comment:            The GeneXpert MRSA Assay (FDA     approved for NASAL specimens     only), is one component of a     comprehensive MRSA colonization     surveillance program. It is not     intended to diagnose MRSA     infection nor to guide or     monitor treatment for     MRSA infections.   Studies:  No results found.  Assessment/Plan: 73 y.o.  1. AMS secondary to #2, #3, #4, resolving.  --  On zosyn. Blood, urine cultures with Kleb Pneu.     2. Acute of Chronic Renal Failure.  --  Agree probable ATN secondary dehydration plus sepsis. Creatinine slowly worsening.    3. Urosepsis w tachycardia and hypotension.  --Given stress doses of hydrocortisone. Off pressors since 2 am.   4. 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  --HOLD further chemotherapy in the setting of above.  --Patient does not want intubation or chest compressions per prior discussion.   5. S/P R. Hydronephrosis, s/p R ureteral stent (04/03)   --Patient had R ureteral stent placed on 04/03. Planning to exchange per urology.  Held exchange due to #3.   6. Anemia, Thrombocytopenia.  -- Likely secondary to sepsis with DIC.Transfuse for active bleeding or plts less than 10.  Other causes to consider would be lymphoma with bone marrow involvement versus chemotherapy-induced.  --I would hold A/C when plts are less than 50 as her bleeding risk would be high.  Please Give vitamin K if drop in hemoglobin.   7. Anemia secondary to hematuria versus #4 or chemotherapy.  --Please try maintain a hemoglobin greater than 8.   8. Hypothyroidism: Levothyroxine.  9. Hypertension: Anti-hypertensives held due to hypotension.  10. Diabetes mellitus, type II: Worst with steroids.  Hoping to taper off.  HBA1c 8.5. 11. Hyperlipidemia: Pravastatin per PCP.  12.History of atrial fibrillation: Diltiazem, pacemaker. Coumadin being managed per her PCP previously. HOLD coumadin due to decrease in plts.  13. COPD/ Chronic Bronchitis exacerbation.  --Patient advised to continue albuterol q 6 hours as needed.  14. Poor nutrition, hypoalbuminemia. Nutrition as tolerated with ensure/boost.  15. Supra therapeutic INR.  Please give Vitamin K 5.0 now.  Bleeding risk high with low plts.  16. Disposition. Partial.   Thanks for taking care of [name].    CHISM, DAVID, MD 10/09/2013  8:08 AM

## 2013-10-09 NOTE — Progress Notes (Signed)
Noticed pink tinge in urine. MD Grapey at bedside and made aware. He advised as long as it is filtering, it was fine. No orders placed; will continue to monitor.

## 2013-10-09 NOTE — Progress Notes (Signed)
3 Days Post-Op Subjective: Patient reports feeling better. Patient objectively is improved. Hemodynamically stable off pressors at this time. Creatinine remains elevated at 3.0 but has stabilized. Hemoglobin continues to drift down secondary to anemia of chronic disease along with ongoing gross hematuria. She has severe thrombocytopenia as a complicating factor. Her stent has been delayed because of her urosepsis.  Objective: Vital signs in last 24 hours: Temp:  [96.4 F (35.8 C)-97.5 F (36.4 C)] 96.6 F (35.9 C) (06/29 1100) Pulse Rate:  [69-104] 73 (06/29 1100) Resp:  [11-21] 16 (06/29 1100) BP: (76-151)/(30-93) 114/57 mmHg (06/29 1100) SpO2:  [96 %-100 %] 100 % (06/29 1100) Weight:  [222 lb 0.1 oz (100.7 kg)] 222 lb 0.1 oz (100.7 kg) (06/29 0500)  Intake/Output from previous day: 06/28 0701 - 06/29 0700 In: 1670.6 [P.O.:1050; I.V.:420.6; IV Piggyback:200] Out: 1095 [Urine:1095] Intake/Output this shift: Total I/O In: 120 [I.V.:20; IV Piggyback:100] Out: 350 [Urine:350]  Physical Exam:  Constitutional: Vital signs reviewed. WD WN in NAD   Cardiovascular: RRR Pulmonary/Chest: Normal effort Abdominal: No change Genitourinary: Not examined Extremities: No cyanosis or edema   Lab Results:  Recent Labs  10/07/13 0425 10/08/13 0600 10/09/13 0400  HGB 9.7* 8.6* 7.7*  HCT 29.5* 25.7* 22.6*   BMET  Recent Labs  10/08/13 0600 10/09/13 0400  NA 132* 133*  K 4.2 3.9  CL 92* 92*  CO2 20 22  GLUCOSE 411* 319*  BUN 79* 85*  CREATININE 3.06* 3.04*  CALCIUM 7.5* 7.0*    Recent Labs  10/07/13 0425 10/08/13 0600 10/09/13 0400  INR 3.40* 3.98* 3.60*   No results found for this basename: LABURIN,  in the last 72 hours Results for orders placed during the hospital encounter of 10/06/13  CULTURE, BLOOD (ROUTINE X 2)     Status: None   Collection Time    10/06/13  6:58 AM      Result Value Ref Range Status   Specimen Description BLOOD   Final   Special Requests  BOTTLES DRAWN AEROBIC AND ANAEROBIC LEFT AC 5 CC   Final   Culture  Setup Time     Final   Value: 10/06/2013 10:33     Performed at [facility]   Culture     Final   Value: KLEBSIELLA PNEUMONIAE     Note: Gram Stain Report Called to,Read Back By and Verified With: BECKY DAVIS ON 10/06/2013 AT 8:12P BY WILEJ     Performed at [facility]   Report Status 10/08/2013 FINAL   Final   Organism ID, Bacteria KLEBSIELLA PNEUMONIAE   Final  URINE CULTURE     Status: None   Collection Time    10/06/13  7:55 AM      Result Value Ref Range Status   Specimen Description URINE, RANDOM   Final   Special Requests NONE   Final   Culture  Setup Time     Final   Value: 10/06/2013 10:41     Performed at [facility]     Final   Value: >=100,000 COLONIES/ML     Performed at [facility]   Culture     Final   Value: KLEBSIELLA PNEUMONIAE     Performed at [facility]   Report Status 10/08/2013 FINAL   Final   Organism ID, Bacteria KLEBSIELLA PNEUMONIAE   Final  CULTURE, BLOOD (ROUTINE X 2)     Status: None   Collection Time  10/06/13  9:05 AM      Result Value Ref Range Status   Specimen Description BLOOD   Final   Special Requests BOTTLES DRAWN AEROBIC AND ANAEROBIC PORT 5 CC   Final   Culture  Setup Time     Final   Value: 10/06/2013 12:38     Performed at [facility]   Culture     Final   Value: KLEBSIELLA PNEUMONIAE     Note: SUSCEPTIBILITIES PERFORMED ON PREVIOUS CULTURE WITHIN THE LAST 5 DAYS.     Note: Gram Stain Report Called to,Read Back By and Verified With: BECKY DAVIS ON 10/06/2013 AT 8:12P BY Dennard Nip     Performed at [facility]   Report Status 10/08/2013 FINAL   Final  MRSA PCR SCREENING     Status: None   Collection Time    10/06/13 11:08 AM      Result Value Ref Range Status   MRSA by PCR NEGATIVE  NEGATIVE Final   Comment:            The GeneXpert MRSA Assay (FDA     approved for NASAL specimens      only), is one component of a     comprehensive MRSA colonization     surveillance program. It is not     intended to diagnose MRSA     infection nor to guide or     monitor treatment for     MRSA infections.    Studies/Results: No results found.  Assessment/Plan:   Urosepsis resolving ongoing issues of significant anemia and severe thrombocytopenia. Will tentatively plan on double-J stent exchange on 10/11/2013.  We would not anticipate significant blood loss with that procedure. The patient may require transfusion given her constant declining hemoglobin level.  Because I am Covering this patient for Dr. Junious Silk I am uncertain of her ultimate prognosis. Question how much additional aggressive care versus hospice  Question oncology's input as to ongoing aggressiveness of care   LOS: 3 days   GRAPEY,DAVID S 10/09/2013, 11:45 AM

## 2013-10-10 DIAGNOSIS — D500 Iron deficiency anemia secondary to blood loss (chronic): Secondary | ICD-10-CM

## 2013-10-10 DIAGNOSIS — T451X5A Adverse effect of antineoplastic and immunosuppressive drugs, initial encounter: Secondary | ICD-10-CM

## 2013-10-10 DIAGNOSIS — D709 Neutropenia, unspecified: Secondary | ICD-10-CM

## 2013-10-10 LAB — GLUCOSE, CAPILLARY
GLUCOSE-CAPILLARY: 198 mg/dL — AB (ref 70–99)
GLUCOSE-CAPILLARY: 364 mg/dL — AB (ref 70–99)
GLUCOSE-CAPILLARY: 198 mg/dL — AB (ref 70–99)
Glucose-Capillary: 267 mg/dL — ABNORMAL HIGH (ref 70–99)

## 2013-10-10 LAB — PROCALCITONIN: PROCALCITONIN: 5.99 ng/mL

## 2013-10-10 LAB — TYPE AND SCREEN
ABO/RH(D): B POS
Antibody Screen: NEGATIVE

## 2013-10-10 LAB — PROTIME-INR
INR: 1.62 — ABNORMAL HIGH (ref 0.00–1.49)
Prothrombin Time: 19.2 seconds — ABNORMAL HIGH (ref 11.6–15.2)

## 2013-10-10 MED ORDER — METOPROLOL TARTRATE 1 MG/ML IV SOLN
INTRAVENOUS | Status: AC
  Filled 2013-10-10: qty 5

## 2013-10-10 MED ORDER — DEXAMETHASONE 2 MG PO TABS: 2.0000 mg | ORAL_TABLET | Freq: Two times a day (BID) | ORAL | Status: DC

## 2013-10-10 MED ORDER — METOPROLOL TARTRATE 1 MG/ML IV SOLN
2.5000 mg | Freq: Four times a day (QID) | INTRAVENOUS | Status: DC
  Administered 2013-10-10 – 2013-10-16 (×9): 2.5 mg via INTRAVENOUS
  Filled 2013-10-10 (×14): qty 5

## 2013-10-10 MED ORDER — TIOTROPIUM BROMIDE MONOHYDRATE 18 MCG IN CAPS
18.0000 ug | ORAL_CAPSULE | Freq: Every day | RESPIRATORY_TRACT | Status: DC
  Administered 2013-10-10 – 2013-10-17 (×8): 18 ug via RESPIRATORY_TRACT
  Filled 2013-10-10: qty 5

## 2013-10-10 MED ORDER — SODIUM CHLORIDE 0.9 % IV BOLUS (SEPSIS)
500.0000 mL | Freq: Once | INTRAVENOUS | Status: AC
  Administered 2013-10-10: 500 mL via INTRAVENOUS

## 2013-10-10 NOTE — Progress Notes (Signed)
PULMONARY / CRITICAL CARE MEDICINE   Name: [name] MRN: [id number] DOB: [birth year]    ADMISSION DATE:  10/06/2013 CONSULTATION DATE:  10/06/13  REFERRING MD :  Dr. Charlies Silvers  PRIMARY SERVICE: TRH  CHIEF COMPLAINT:  Urosepsis   BRIEF PATIENT DESCRIPTION: 73 y/o F, SNF Resident,  with PMH of HTN, HLD, recurrent lymphoma, COPD, CKD, R ureteral stent placement (4/3) and recent admission for Enterococcus UTI (5/20-5/28) who was admitted on 6/26 with recurrent sepsis / UTI.  PCCM consulted for evaluation.    SIGNIFICANT EVENTS: 10/2010 - Dx with NHL 04/2011 - Chemo R CHOP + XRT with good response 10/2012 - Recurrence, Rx with salvage chemo 05/2013 - Recurrence, CT A/P >> increased LAN with severe R hydro 06/2013 - Admission for sepsis, pyelo 07/14/13 - R ureteral stent placement  5/20-5/28 - Admission for Enterococcus UTI / Sepsis. AKI/CKD- multi-factorial with R-sided hydro, nephrotoxic agents, urosepsis, hypercalcemia, and volume depletion related to diarrhea as well as nephrotoxic agents. Improved with IVF's. Creatinine as high as 7.6 down to 2.58 on d/c. She underwent renal ultrasound which showed right stent in place, no hydronephrosis on the right or the left. ...................................................................................................................................................... 6/26 - Readmit with c/o fever, increased swelling, periods of hypoglycemia.  UTI +, Sepsis 6/29 - Resolved sepsis, off pressors.  Significant thrombocytopenia   STUDIES:  6/26 renal US >> no hydronephrosis, stent shadow  LINES / TUBES: R CW Port >>>  CULTURES: Hx Enterococcus UTI 5/20>> sens Vanco, nitrofurantioin BCx2 6/26 >>klebsiella>>sens zosyn UC 6/26 >>klebsiella>>sens zosyn   ANTIBIOTICS: Rocephin 6/26 >>>x1 Zosyn 6/26 >>>6/29 Vanco 6/26 >>> 6/28 Rocephin 6/29 >>   SUBJECTIVE: RN reports mild nose bleeding - small amt that dries, gets cleaned and reappears.  No  other acute events. Planned stent removal 7/1  VITAL SIGNS: Temp:  [96.4 F (35.8 C)-98.1 F (36.7 C)] 98.1 F (36.7 C) (06/30 0600) Pulse Rate:  [68-75] 73 (06/30 0600) Resp:  [12-20] 18 (06/30 0600) BP: (88-127)/(56-75) 117/64 mmHg (06/30 0600) SpO2:  [94 %-100 %] 96 % (06/30 0600) Weight:  [219 lb 12.8 oz (99.7 kg)] 219 lb 12.8 oz (99.7 kg) (06/30 0415)  INTAKE / OUTPUT: Intake/Output     06/29 0701 - 06/30 0700 06/30 0701 - 07/01 0700   P.O. 1020    I.V. (mL/kg) 360 (3.6)    IV Piggyback 100    Total Intake(mL/kg) 1480 (14.8)    Urine (mL/kg/hr) 2175 (0.9)    Total Output 2175     Net -695          Stool Occurrence 1 x      PHYSICAL EXAMINATION: General:  Morbidly obese in NAD Neuro:  Awake, alert, MAE, oriented to self but confused otherwise, very talkative HEENT:  Mm pink/dry, no jvd Cardiovascular:  s1s2 rrr, no m/r/g Lungs:  resp's even/non-labored, lungs bilaterally clear Abdomen:  Obese, soft, bsx4 active Musculoskeletal:  No acute deformities , peripheral pulses + Skin:  Warm/dry, 1-2+ pitting edema in LE, good cap refill  LABS:  CBC  Recent Labs Lab 10/07/13 0425 10/08/13 0600 10/09/13 0400  WBC 6.6 6.2 4.0  HGB 9.7* 8.6* 7.7*  HCT 29.5* 25.7* 22.6*  PLT 22* 13* 14*   Coag's  Recent Labs Lab 10/06/13 1017  10/08/13 0600 10/09/13 0400 10/10/13 0410  APTT 49*  --   --   --   --   INR 2.91*  < > 3.98* 3.60* 1.62*  < > = values in this interval not displayed. BMET  Recent  Labs Lab 10/07/13 0425 10/08/13 0600 10/09/13 0400  NA 129* 132* 133*  K 4.2 4.2 3.9  CL 91* 92* 92*  CO2 21 20 22   BUN 68* 79* 85*  CREATININE 2.79* 3.06* 3.04*  GLUCOSE 242* 411* 319*   Electrolytes  Recent Labs Lab 10/06/13 1017 10/07/13 0425 10/08/13 0600 10/09/13 0400  CALCIUM 9.0 8.3* 7.5* 7.0*  MG 2.0  --   --   --   PHOS 4.6  --   --   --    Sepsis Markers  Recent Labs Lab 10/06/13 0713 10/06/13 1017 10/06/13 1450 10/08/13 0600  10/10/13 0410  LATICACIDVEN 1.83  --  4.7*  --   --   PROCALCITON  --  6.24  --  16.52 5.99   ABG No results found for this basename: PHART, PCO2ART, PO2ART,  in the last 168 hours Liver Enzymes  Recent Labs Lab 10/06/13 1017 10/07/13 0425 10/09/13 0400  AST 16 72* 52*  ALT 36* 81* 92*  ALKPHOS 109 150* 107  BILITOT 0.2* 0.5 0.2*  ALBUMIN 2.0* 1.8* 1.4*   Cardiac Enzymes  Recent Labs Lab 10/06/13 0657  10/06/13 1447 10/06/13 2200 10/07/13 0425  TROPONINI  --   < > <0.30 <0.30 <0.30  PROBNP 8619.0*  --   --   --   --   < > = values in this interval not displayed. Glucose  Recent Labs Lab 10/08/13 1704 10/08/13 2223 10/09/13 0818 10/09/13 1206 10/09/13 1710 10/09/13 2108  GLUCAP 338* 260* 332* 358* 271* 220*    Imaging No results found.  ASSESSMENT / PLAN:  PULMONARY A: COPD - without acute exacerbation  P:   Pulmonary hygiene / mobilize PRN BD's Continue spiriva DNI  CARDIOVASCULAR A:  Septic shock - secondary to klebsiella bacteremia / UTI.  Vasopressors weaned off 6/9 0200 Atrial Fibrillation Sick Sinus Syndrome s/p Pacemaker  Baseline Steroid Dependent  P:  Holding anticoagulation in setting of thrombocytopenia; D/C stress steroids 6/30 Continue zocor Will need diuresis for volume removal prior to discharge.   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 - sr cr on 6/19 1.7 R Hydronephrosis s/p R ureteral stent placement 4/3  Hyponatremia P:   Urology input regarding stent exchange noted - planned exchange 7/1 Hold allopurinol in setting of recent renal injury Hold lasix, cardizem  GASTROINTESTINAL A:   Hx Constipation P:   Carb modified diet NPO p MN on 6/30 Dulcolax QHS   HEMATOLOGIC / ONC  A:   Relapsed, Refractory high-grade Non-Hodgkin's B-Cell Lymphoma - on salvage therapy of Rituxan + Revlimid as 4th line Rx Anemia Coagulopathy - on comadin for afib, INR 2.86 on admit Thrombocytopenia - platelets ranging in  teens, likely secondary to DIC P:  HOLD Coumadin with thrombocytopenia Monitor CBC / INR daily Transfuse platelets > goal greater than 20k given impending endoscopic procedure  Vitamin K 5mg  x1 6/29 Monitor for spontaneous bleeding  Hold further chemo in setting of sepsis Repeat CBC at 1 AM, transfuse again if platelets < 20 or Hgb < 7%   INFECTIOUS A:   Klebsiella UTI  Septic shock - secondary to klebsiella bacteremia, UTI P:   ABX / Cultures as above Narrowed abx 6/29    ENDOCRINE A:   Diabetes Mellitus - uncontrolled on steroids Hypothyroidism Gout P:   SSI - resistant scale Increase lantus to 20 units 6/29 >>planned OR 7/1, consider D5 to avoid hypoglycemia.   D/C steroids on 6/30, anticipate  glucose control will improve after d/c  Resume home dexamethasone 7/1 Continue synthroid Hold allopurinol   NEUROLOGIC A:   Acute Encephalopathy  P:   Supportive care, promote sleep / wake cycle Minimize sedating medications Mobilize as able  GLOBAL: -Niece is HCPOA -consider goals of care  Noe Gens, NP-C [facility] Pgr: [phone] or [phone]   Attending:  I have personally obtained a history, examined the patient, evaluated laboratory and imaging results, formulated the assessment and plan and placed orders.  Clinically improving but remains critically ill, pending stent replacement 7/1, will need to coordinate transfusions with urology.  Care during the described time interval was provided by me and/or other providers on the critical care team.  I have reviewed this patient's available data, including medical history, events of note, physical examination and test results as part of my evaluation  CC time x 35 min  10/10/2013, 8:10 AM  Roselie Awkward, MD [facility] Pager: [phone] Cell: [phone] If no response, call [phone]

## 2013-10-10 NOTE — Progress Notes (Signed)
OT Cancellation Note  Patient Details Name: [name] MRN: [id number] DOB: 02/19/1941   Cancelled Treatment:    Reason Eval/Treat Not Completed: Other (comment). Pt is getting platelets.  Will check back tomorrow.    SPENCER,MARYELLEN 10/10/2013, 3:11 PM Lesle Chris, OTR/L [phone] 10/10/2013

## 2013-10-10 NOTE — Progress Notes (Signed)
CSW continuing to follow for pt disposition planning.  CSW visited pt room and no family present at bedside.  CSW contacted pt niece, Tressie Stalker to discuss. CSW discussed with pt niece that [facility] unable to offer a bed. Pt niece expressed understanding and interested in exploring other [facility] SNFs to see if there is a facility that may be closer to pt family. Pt niece also still considering pt returning to [facility] as pt family has been pleased with care at facility. Pt niece discussed that pt will likely need long term care and pt family in process of applying for Medicaid.  CSW inquired with pt niece about consult regarding son requesting to speak about advanced directives. Pt niece and HCPOA provides permission for medical team to discuss with pt son regarding pt medical conditions. Pt niece discussed that she was hoping that pt son could be added to the [facility] and CSW discussed that given pt not oriented at this time then pt HCPOA could not be changed at this time, but given HCPOA providing permission to speak with pt son then discussions can be had with pt son regarding pt care. Pt niece expressed understanding.  CSW updated FL2 and initiated SNF search in order for pt niece to be aware of options and make determination about returning to [facility] vs another SNF.  Pt has [employer] which requires authorization prior to pt discharge.   CSW to continue to follow to provide support and assist with pt disposition needs.   Alison [name], MSW, [facility] [phone]

## 2013-10-10 NOTE — Progress Notes (Signed)
[name]   DOB:04-10-1941   LD#:[id number]   JQZ#:[id number]  Subjective: Niece at bedside.  No acute overnight events noted.  Patient appears clinically improved and is tolerating PO without difficulty. Scheduled for stent replacement tomorrow. Receiving one bag of plts.   Objective:  Filed Vitals:   10/10/13 1345  BP: 111/50  Pulse: 25  Temp: 98.2 F (36.8 C)  Resp: 14    Body mass index is 44.37 kg/(m^2).  Intake/Output Summary (Last 24 hours) at 10/10/13 1435 Last data filed at 10/10/13 1345  Gross per 24 hour  Intake   1523 ml  Output   1955 ml  Net   -432 ml    Oriented to self. Pale  Sclerae unicteric  Oropharynx clear  No peripheral adenopathy  Lungs clear -- no rales or rhonchi  Regular rate and rhythm S1 S2 without murmurs  Abdomen obese, NTTP +BS  MSK 2+ edema bilaterally  Neuro, moves all her extremities.     CBG (last 3)   Recent Labs  10/09/13 2108 10/10/13 0741 10/10/13 1235  GLUCAP 220* 198* 364*     Labs:  Lab Results  Component Value Date   WBC 4.0 10/09/2013   HGB 7.7* 10/09/2013   HCT 22.6* 10/09/2013   MCV 85.9 10/09/2013   PLT 14* 10/09/2013   NEUTROABS 5.8 10/03/6331    Basic Metabolic Panel:  Recent Labs Lab 10/06/13 0658 10/06/13 1017 10/07/13 0425 10/08/13 0600 10/09/13 0400  NA 129* 132* 129* 132* 133*  K 4.8 4.7 4.2 4.2 3.9  CL 89* 91* 91* 92* 92*  CO2 21 23 21 20 22   GLUCOSE 327* 341* 242* 411* 319*  BUN 71* 71* 68* 79* 85*  CREATININE 2.43* 2.56* 2.79* 3.06* 3.04*  CALCIUM 9.3 9.0 8.3* 7.5* 7.0*  MG  --  2.0  --   --   --   PHOS  --  4.6  --   --   --    GFR Estimated Creatinine Clearance: 17.4 ml/min (by C-G formula based on Cr of 3.04). Liver Function Tests:  Recent Labs Lab 10/06/13 0658 10/06/13 1017 10/07/13 0425 10/09/13 0400  AST 18 16 72* 52*  ALT 41* 36* 81* 92*  ALKPHOS 107 109 150* 107  BILITOT 0.4 0.2* 0.5 0.2*  PROT 6.0 5.5* 5.5* 4.8*  ALBUMIN 2.1* 2.0* 1.8* 1.4*    Recent Labs Lab  10/06/13 0658  LIPASE 34   No results found for this basename: AMMONIA,  in the last 168 hours Coagulation profile  Recent Labs Lab 10/06/13 1017 10/07/13 0425 10/08/13 0600 10/09/13 0400 10/10/13 0410  INR 2.91* 3.40* 3.98* 3.60* 1.62*    CBC:  Recent Labs Lab 10/06/13 0658 10/06/13 1017 10/07/13 0425 10/08/13 0600 10/09/13 0400  WBC 7.2 6.2 6.6 6.2 4.0  NEUTROABS 6.6 5.8  --   --   --   HGB 10.4* 9.7* 9.7* 8.6* 7.7*  HCT 31.1* 29.1* 29.5* 25.7* 22.6*  MCV 88.6 88.7 88.3 86.8 85.9  PLT PLATELET CLUMPS NOTED ON SMEAR, UNABLE TO ESTIMATE 24* 22* 13* 14*   CBG:  Recent Labs Lab 10/09/13 1206 10/09/13 1710 10/09/13 2108 10/10/13 0741 10/10/13 1235  GLUCAP 358* 271* 220* 198* 364*   D-Dimer No results found for this basename: DDIMER,  in the last 72 hours Hgb A1c No results found for this basename: HGBA1C,  in the last 72 hours Microbiology Recent Results (from the past 240 hour(s))  CULTURE, BLOOD (ROUTINE X 2)  Status: None   Collection Time    10/06/13  6:58 AM      Result Value Ref Range Status   Specimen Description BLOOD   Final   Special Requests BOTTLES DRAWN AEROBIC AND ANAEROBIC LEFT AC 5 CC   Final   Culture  Setup Time     Final   Value: 10/06/2013 10:33     Performed at [facility]   Culture     Final   Value: KLEBSIELLA PNEUMONIAE     Note: Gram Stain Report Called to,Read Back By and Verified With: BECKY DAVIS ON 10/06/2013 AT 8:12P BY WILEJ     Performed at [facility]   Report Status 10/08/2013 FINAL   Final   Organism ID, Bacteria KLEBSIELLA PNEUMONIAE   Final  URINE CULTURE     Status: None   Collection Time    10/06/13  7:55 AM      Result Value Ref Range Status   Specimen Description URINE, RANDOM   Final   Special Requests NONE   Final   Culture  Setup Time     Final   Value: 10/06/2013 10:41     Performed at [facility]     Final   Value: >=100,000 COLONIES/ML     Performed at  [facility]   Culture     Final   Value: KLEBSIELLA PNEUMONIAE     Performed at [facility]   Report Status 10/08/2013 FINAL   Final   Organism ID, Bacteria KLEBSIELLA PNEUMONIAE   Final  CULTURE, BLOOD (ROUTINE X 2)     Status: None   Collection Time    10/06/13  9:05 AM      Result Value Ref Range Status   Specimen Description BLOOD   Final   Special Requests BOTTLES DRAWN AEROBIC AND ANAEROBIC PORT 5 CC   Final   Culture  Setup Time     Final   Value: 10/06/2013 12:38     Performed at [facility]   Culture     Final   Value: KLEBSIELLA PNEUMONIAE     Note: SUSCEPTIBILITIES PERFORMED ON PREVIOUS CULTURE WITHIN THE LAST 5 DAYS.     Note: Gram Stain Report Called to,Read Back By and Verified With: BECKY DAVIS ON 10/06/2013 AT 8:12P BY Dennard Nip     Performed at [facility]   Report Status 10/08/2013 FINAL   Final  MRSA PCR SCREENING     Status: None   Collection Time    10/06/13 11:08 AM      Result Value Ref Range Status   MRSA by PCR NEGATIVE  NEGATIVE Final   Comment:            The GeneXpert MRSA Assay (FDA     approved for NASAL specimens     only), is one component of a     comprehensive MRSA colonization     surveillance program. It is not     intended to diagnose MRSA     infection nor to guide or     monitor treatment for     MRSA infections.   Studies:  No results found.  Assessment/Plan: 73 y.o.  1. AMS secondary to #2, #3, #4, resolving.  --  On zosyn. Blood, urine cultures with Kleb Pneu.  Afebrile.    2. Acute of Chronic Renal Failure.  --  Agree probable ATN secondary dehydration plus sepsis. Creatinine stable. For  stent tomorrow.   3. Urosepsis w tachycardia and hypotension.  --Given stress doses of hydrocortisone. Off pressors.   4. 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  --HOLD further  chemotherapy in the setting of above.  --Patient does not want intubation or chest compressions per prior discussion.  --Prognosis overall is poor given this is her 4th line therapy with response rates less that 30%.  We will likely start the palliative care discussion once acute urosepsis is resolved.   5. S/P R. Hydronephrosis, s/p R ureteral stent (04/03)  --Patient had R ureteral stent placed on 04/03. Planning to exchange per urology.    6. Anemia, Thrombocytopenia.  -- Likely secondary to sepsis with DIC.  DIC panel in am.  Transfuse for active bleeding or plts less than 10. Will receive one bag of plts per urology for procedure.   Other causes to consider would be lymphoma with bone marrow involvement versus chemotherapy-induced.  --I would hold A/C when plts are less than 50 as her bleeding risk would be high.    7. Anemia secondary to hematuria versus #4 or chemotherapy.  --Please try maintain a hemoglobin greater than 7 or transfuse for symptoms or active bleeding..   8. Hypothyroidism: Levothyroxine.  9. Hypertension: Anti-hypertensives held due to hypotension.  10. Diabetes mellitus, type II: Worst with steroids.  Hoping to taper off.  HBA1c 8.5. 11. Hyperlipidemia: Pravastatin per PCP.  12.History of atrial fibrillation: Diltiazem, pacemaker. Coumadin being managed per her PCP previously. HOLD coumadin due to decrease in plts.  13. COPD/ Chronic Bronchitis exacerbation.  --Patient advised to continue albuterol q 6 hours as needed.  14. Poor nutrition, hypoalbuminemia. Nutrition as tolerated with ensure/boost.  15. Disposition. Partial.   Thanks for taking care of [name].    CHISM, DAVID, MD 10/10/2013  2:35 PM

## 2013-10-11 ENCOUNTER — Encounter (HOSPITAL_COMMUNITY): Payer: Self-pay | Admitting: Certified Registered Nurse Anesthetist

## 2013-10-11 ENCOUNTER — Inpatient Hospital Stay (HOSPITAL_COMMUNITY): Payer: Medicare Other

## 2013-10-11 ENCOUNTER — Encounter (HOSPITAL_COMMUNITY)
Admission: EM | Disposition: A | Discharge status: Skilled Nursing Facility | Payer: Self-pay | Source: Home / Self Care | Attending: Pulmonary Disease

## 2013-10-11 ENCOUNTER — Inpatient Hospital Stay (HOSPITAL_COMMUNITY): Payer: Medicare Other | Admitting: Anesthesiology

## 2013-10-11 ENCOUNTER — Inpatient Hospital Stay (HOSPITAL_COMMUNITY): Payer: Medicare Other | Admitting: Certified Registered Nurse Anesthetist

## 2013-10-11 ENCOUNTER — Encounter (HOSPITAL_COMMUNITY): Payer: Medicare Other | Admitting: Anesthesiology

## 2013-10-11 ENCOUNTER — Encounter (HOSPITAL_COMMUNITY): Payer: Medicare Other | Admitting: Certified Registered Nurse Anesthetist

## 2013-10-11 DIAGNOSIS — G9340 Encephalopathy, unspecified: Secondary | ICD-10-CM

## 2013-10-11 DIAGNOSIS — J449 Chronic obstructive pulmonary disease, unspecified: Secondary | ICD-10-CM

## 2013-10-11 DIAGNOSIS — R6521 Severe sepsis with septic shock: Secondary | ICD-10-CM

## 2013-10-11 DIAGNOSIS — A419 Sepsis, unspecified organism: Secondary | ICD-10-CM

## 2013-10-11 DIAGNOSIS — I4891 Unspecified atrial fibrillation: Secondary | ICD-10-CM

## 2013-10-11 HISTORY — PX: CYSTOSCOPY WITH STENT PLACEMENT: SHX5790

## 2013-10-11 LAB — BASIC METABOLIC PANEL
BUN: 74 mg/dL — AB (ref 6–23)
CHLORIDE: 104 meq/L (ref 96–112)
CO2: 20 meq/L (ref 19–32)
Calcium: 5.8 mg/dL — CL (ref 8.4–10.5)
Creatinine, Ser: 2.24 mg/dL — ABNORMAL HIGH (ref 0.50–1.10)
GFR calc Af Amer: 24 mL/min — ABNORMAL LOW (ref >90)
GFR calc non Af Amer: 21 mL/min — ABNORMAL LOW (ref >90)
Glucose, Bld: 164 mg/dL — ABNORMAL HIGH (ref 70–99)
Potassium: 2.6 mEq/L — CL (ref 3.7–5.3)
Sodium: 140 mEq/L (ref 137–147)

## 2013-10-11 LAB — GLUCOSE, CAPILLARY
GLUCOSE-CAPILLARY: 177 mg/dL — AB (ref 70–99)
GLUCOSE-CAPILLARY: 433 mg/dL — AB (ref 70–99)
Glucose-Capillary: 150 mg/dL — ABNORMAL HIGH (ref 70–99)
Glucose-Capillary: 248 mg/dL — ABNORMAL HIGH (ref 70–99)

## 2013-10-11 LAB — CBC
HCT: 24.2 % — ABNORMAL LOW (ref 36.0–46.0)
HEMATOCRIT: 22.5 % — AB (ref 36.0–46.0)
Hemoglobin: 8.1 g/dL — ABNORMAL LOW (ref 12.0–15.0)
Hemoglobin: 7.6 g/dL — ABNORMAL LOW (ref 12.0–15.0)
MCH: 29 pg (ref 26.0–34.0)
MCH: 29.5 pg (ref 26.0–34.0)
MCHC: 33.5 g/dL (ref 30.0–36.0)
MCHC: 33.8 g/dL (ref 30.0–36.0)
MCV: 86.7 fL (ref 78.0–100.0)
MCV: 87.2 fL (ref 78.0–100.0)
PLATELETS: 76 10*3/uL — AB (ref 150–400)
PLATELETS: 65 10*3/uL — AB (ref 150–400)
RBC: 2.79 MIL/uL — ABNORMAL LOW (ref 3.87–5.11)
RBC: 2.58 MIL/uL — ABNORMAL LOW (ref 3.87–5.11)
RDW: 20 % — ABNORMAL HIGH (ref 11.5–15.5)
RDW: 20.2 % — ABNORMAL HIGH (ref 11.5–15.5)
WBC: 4.9 10*3/uL (ref 4.0–10.5)
WBC: 4.3 10*3/uL (ref 4.0–10.5)

## 2013-10-11 LAB — PREPARE PLATELET PHERESIS: UNIT DIVISION: 0

## 2013-10-11 LAB — PROTIME-INR
INR: 1.3 (ref 0.00–1.49)
Prothrombin Time: 16.2 seconds — ABNORMAL HIGH (ref 11.6–15.2)

## 2013-10-11 SURGERY — CYSTOSCOPY, WITH STENT INSERTION
Anesthesia: Monitor Anesthesia Care | Site: Pelvis | Laterality: Right

## 2013-10-11 SURGERY — CYSTOSCOPY, FLEXIBLE, WITH STENT REPLACEMENT
Anesthesia: Monitor Anesthesia Care

## 2013-10-11 MED ORDER — SODIUM CHLORIDE 0.9 % IV SOLN
1.0000 g | Freq: Once | INTRAVENOUS | Status: AC
  Administered 2013-10-11: 1 g via INTRAVENOUS
  Filled 2013-10-11: qty 10

## 2013-10-11 MED ORDER — EPHEDRINE SULFATE 50 MG/ML IJ SOLN
INTRAMUSCULAR | Status: AC
  Filled 2013-10-11: qty 1

## 2013-10-11 MED ORDER — POTASSIUM CHLORIDE CRYS ER 20 MEQ PO TBCR
40.0000 meq | EXTENDED_RELEASE_TABLET | Freq: Once | ORAL | Status: DC
Start: 2013-10-11 — End: 2013-10-11
  Filled 2013-10-11: qty 2

## 2013-10-11 MED ORDER — LIDOCAINE HCL 2 % EX GEL
CUTANEOUS | Status: AC
  Filled 2013-10-11: qty 10

## 2013-10-11 MED ORDER — FENTANYL CITRATE 0.05 MG/ML IJ SOLN
INTRAMUSCULAR | Status: DC | PRN
  Administered 2013-10-11 (×4): 25 ug via INTRAVENOUS

## 2013-10-11 MED ORDER — DILTIAZEM HCL 25 MG/5ML IV SOLN
25.0000 mg | Freq: Once | INTRAVENOUS | Status: AC
  Filled 2013-10-11: qty 5

## 2013-10-11 MED ORDER — SODIUM CHLORIDE 0.9 % IV BOLUS (SEPSIS)
500.0000 mL | Freq: Once | INTRAVENOUS | Status: AC
  Administered 2013-10-11: 500 mL via INTRAVENOUS

## 2013-10-11 MED ORDER — PROPOFOL INFUSION 10 MG/ML OPTIME
INTRAVENOUS | Status: DC | PRN
  Administered 2013-10-11: 100 ug/kg/min via INTRAVENOUS

## 2013-10-11 MED ORDER — PHENYLEPHRINE HCL 10 MG/ML IJ SOLN
30.0000 ug/min | INTRAVENOUS | Status: DC
  Administered 2013-10-11: 50 ug/min via INTRAVENOUS
  Administered 2013-10-11: 30 ug/min via INTRAVENOUS
  Administered 2013-10-11: 50 ug/min via INTRAVENOUS
  Administered 2013-10-11: 75 ug/min via INTRAVENOUS
  Filled 2013-10-11 (×4): qty 1

## 2013-10-11 MED ORDER — ONDANSETRON HCL 4 MG/2ML IJ SOLN
INTRAMUSCULAR | Status: DC | PRN
  Administered 2013-10-11: 4 mg via INTRAVENOUS

## 2013-10-11 MED ORDER — DEXTROSE 5 % IV SOLN
1.0000 g | INTRAVENOUS | Status: DC
  Administered 2013-10-11 – 2013-10-17 (×7): 1 g via INTRAVENOUS
  Filled 2013-10-11 (×8): qty 10

## 2013-10-11 MED ORDER — PROPOFOL 10 MG/ML IV BOLUS
INTRAVENOUS | Status: AC
  Filled 2013-10-11: qty 20

## 2013-10-11 MED ORDER — SODIUM CHLORIDE 0.9 % IV SOLN
INTRAVENOUS | Status: DC | PRN
  Administered 2013-10-11: 16:00:00 via INTRAVENOUS

## 2013-10-11 MED ORDER — FENTANYL CITRATE 0.05 MG/ML IJ SOLN
INTRAMUSCULAR | Status: AC
  Filled 2013-10-11: qty 2

## 2013-10-11 MED ORDER — INSULIN ASPART 100 UNIT/ML ~~LOC~~ SOLN
22.0000 [IU] | Freq: Once | SUBCUTANEOUS | Status: AC
  Administered 2013-10-11: 22 [IU] via SUBCUTANEOUS

## 2013-10-11 MED ORDER — POTASSIUM CHLORIDE 10 MEQ/100ML IV SOLN
10.0000 meq | INTRAVENOUS | Status: AC
  Administered 2013-10-11 (×4): 10 meq via INTRAVENOUS
  Filled 2013-10-11 (×3): qty 100

## 2013-10-11 MED ORDER — DILTIAZEM LOAD VIA INFUSION
10.0000 mg | Freq: Once | INTRAVENOUS | Status: DC
  Filled 2013-10-11: qty 10

## 2013-10-11 MED ORDER — LIDOCAINE HCL (CARDIAC) 20 MG/ML IV SOLN
INTRAVENOUS | Status: DC | PRN
  Administered 2013-10-11: 50 mg via INTRAVENOUS

## 2013-10-11 MED ORDER — LIDOCAINE HCL 2 % EX GEL
CUTANEOUS | Status: DC | PRN
  Administered 2013-10-11: 1

## 2013-10-11 MED ORDER — HYDROCORTISONE SOD SUCCINATE 100 MG IJ SOLR
50.0000 mg | Freq: Four times a day (QID) | INTRAMUSCULAR | Status: DC
  Administered 2013-10-11 – 2013-10-12 (×3): 50 mg via INTRAVENOUS
  Filled 2013-10-11: qty 2
  Filled 2013-10-11 (×3): qty 1
  Filled 2013-10-11: qty 2
  Filled 2013-10-11 (×5): qty 1
  Filled 2013-10-11: qty 2

## 2013-10-11 MED ORDER — DILTIAZEM HCL 100 MG IV SOLR
5.0000 mg/h | INTRAVENOUS | Status: DC
  Administered 2013-10-11: 5 mg/h via INTRAVENOUS
  Filled 2013-10-11: qty 100

## 2013-10-11 MED ORDER — FENTANYL CITRATE 0.05 MG/ML IJ SOLN: 25.0000 ug | INTRAMUSCULAR | Status: DC | PRN

## 2013-10-11 MED ORDER — LACTATED RINGERS IV SOLN: INTRAVENOUS | Status: DC

## 2013-10-11 MED ORDER — SODIUM CHLORIDE 0.9 % IJ SOLN
INTRAMUSCULAR | Status: AC
  Filled 2013-10-11: qty 10

## 2013-10-11 MED ORDER — SODIUM CHLORIDE 0.9 % IV SOLN
INTRAVENOUS | Status: DC
  Administered 2013-10-11: 1000 mL via INTRAVENOUS

## 2013-10-11 MED ORDER — ATROPINE SULFATE 0.4 MG/ML IJ SOLN
INTRAMUSCULAR | Status: AC
  Filled 2013-10-11: qty 1

## 2013-10-11 SURGICAL SUPPLY — 17 items
BAG URO CATCHER STRL LF (DRAPE) ×3 IMPLANT
BASKET LASER NITINOL 1.9FR (BASKET) IMPLANT
BSKT STON RTRVL 120 1.9FR (BASKET)
CATH INTERMIT  6FR 70CM (CATHETERS) ×2 IMPLANT
CLOTH BEACON ORANGE TIMEOUT ST (SAFETY) ×3 IMPLANT
DRAPE CAMERA CLOSED 9X96 (DRAPES) ×3 IMPLANT
GLOVE BIOGEL M STRL SZ7.5 (GLOVE) ×3 IMPLANT
GOWN STRL REUS W/TWL LRG LVL3 (GOWN DISPOSABLE) ×3 IMPLANT
GUIDEWIRE ANG ZIPWIRE 038X150 (WIRE) ×1 IMPLANT
GUIDEWIRE STR DUAL SENSOR (WIRE) ×3 IMPLANT
MANIFOLD NEPTUNE II (INSTRUMENTS) ×3 IMPLANT
PACK CYSTO (CUSTOM PROCEDURE TRAY) ×3 IMPLANT
STENT CONTOUR 6FRX24X.038 (STENTS) ×2 IMPLANT
TRAY FOLEY CATH 16FRSI W/METER (SET/KITS/TRAYS/PACK) ×2 IMPLANT
TUBE FEEDING 8FR 16IN STR KANG (MISCELLANEOUS) ×1 IMPLANT
TUBING CONNECTING 10 (TUBING) ×2 IMPLANT
TUBING CONNECTING 10' (TUBING) ×1

## 2013-10-11 NOTE — Progress Notes (Signed)
[place] Progress Note Patient Name: [name] DOB: [birth year] MRN: [id number]  Date of Service  10/11/2013   HPI/Events of Note  Severe hypocalcemia Severe hypokalemia   eICU Interventions  DC calcitonin Replete K+   Intervention Category Major Interventions: Electrolyte abnormality - evaluation and management  Merton Border 10/11/2013, 5:41 AM

## 2013-10-11 NOTE — Brief Op Note (Signed)
10/06/2013 - 10/11/2013  4:53 PM  PATIENT:  [name]  73 y.o. female  PRE-OPERATIVE DIAGNOSIS:  right ureteral obstruction  POST-OPERATIVE DIAGNOSIS:  * No post-op diagnosis entered *  PROCEDURE:  Procedure(s): CYSTOSCOPY WITH URETERAL STENT EXCHANGE (Right)  SURGEON:  Surgeon(s) and Role:    * Alexis Frock, MD - Primary  PHYSICIAN ASSISTANT:   ASSISTANTS: none   ANESTHESIA:   MAC  EBL:  Total I/O In: 1955.4 [I.V.:195.4; Other:1250; IV Piggyback:510] Out: -   BLOOD ADMINISTERED:none  DRAINS: 82 F foley to straight drain   LOCAL MEDICATIONS USED:  NONE  SPECIMEN:  No Specimen  DISPOSITION OF SPECIMEN:  N/A  COUNTS:  YES  TOURNIQUET:  * No tourniquets in log *  DICTATION: .Other Dictation: Dictation Number [id number]  PLAN OF CARE: Admit to inpatient   PATIENT DISPOSITION:  ICU - extubated and stable.   Delay start of Pharmacological VTE agent (>24hrs) due to surgical blood loss or risk of bleeding: not applicable

## 2013-10-11 NOTE — Transfer of Care (Signed)
Immediate Anesthesia Transfer of Care Note  Patient: [name]  Procedure(s) Performed: Procedure(s): CYSTOSCOPY WITH URETERAL STENT EXCHANGE (Right)  Patient Location: PACU  Anesthesia Type:MAC  Level of Consciousness: awake, alert  and confused  Airway & Oxygen Therapy: Patient Spontanous Breathing and Patient connected to face mask oxygen  Post-op Assessment: Report given to PACU RN and Post -op Vital signs reviewed and stable  Post vital signs: Reviewed and stable  Complications: No apparent anesthesia complications

## 2013-10-11 NOTE — Progress Notes (Signed)
PULMONARY / CRITICAL CARE MEDICINE   Name: [name] MRN: [id number] DOB: [birth year]    ADMISSION DATE:  10/06/2013 CONSULTATION DATE:  10/06/13  REFERRING MD :  Dr. Charlies Silvers  PRIMARY SERVICE: TRH  CHIEF COMPLAINT:  Urosepsis   BRIEF PATIENT DESCRIPTION: 73 y/o F, [facility] Resident,  with PMH of HTN, HLD, recurrent lymphoma, COPD, CKD, R ureteral stent placement (4/3) and recent admission for Enterococcus UTI (5/20-5/28) who was admitted on 6/26 with recurrent sepsis / UTI.  PCCM consulted for evaluation.    SIGNIFICANT EVENTS: 10/2010 - Dx with NHL 04/2011 - Chemo R CHOP + XRT with good response 10/2012 - Recurrence, Rx with salvage chemo 05/2013 - Recurrence, CT A/P >> increased LAN with severe R hydro 06/2013 - Admission for sepsis, pyelo 07/14/13 - R ureteral stent placement  5/20-5/28 - Admission for Enterococcus UTI / Sepsis. AKI/CKD- multi-factorial with R-sided hydro, nephrotoxic agents, urosepsis, hypercalcemia, and volume depletion related to diarrhea as well as nephrotoxic agents. Improved with IVF's. Creatinine as high as 7.6 down to 2.58 on d/c. She underwent renal ultrasound which showed right stent in place, no hydronephrosis on the right or the left. ...................................................................................................................................................... 6/26 - Readmit with c/o fever, increased swelling, periods of hypoglycemia.  UTI +, Sepsis 6/29 - Resolved sepsis, off pressors.  Significant thrombocytopenia 7/1  - plan for urethral stent change   STUDIES:  6/26 renal US >> no hydronephrosis, stent shadow  LINES / TUBES: R CW Port >>>  CULTURES: Hx Enterococcus UTI 5/20>> sens Vanco, nitrofurantioin BCx2 6/26 >>klebsiella>>sens zosyn UC 6/26 >>klebsiella>>sens zosyn   ANTIBIOTICS: Rocephin 6/26 >>>x1 Zosyn 6/26 >>>6/29 Vanco 6/26 >>> 6/28 Rocephin 6/29 >>   SUBJECTIVE: RN reports mild nose bleeding - small amt that  dries, gets cleaned and reappears.  No other acute events. Planned stent removal 7/1  VITAL SIGNS: Temp:  [98.1 F (36.7 C)-98.8 F (37.1 C)] 98.8 F (37.1 C) (07/01 0418) Pulse Rate:  [25-134] 66 (07/01 0418) Resp:  [13-22] 17 (07/01 0418) BP: (82-117)/(45-78) 91/78 mmHg (07/01 0418) SpO2:  [95 %-99 %] 99 % (07/01 0910) Weight:  [219 lb 2.2 oz (99.4 kg)-219 lb 3.2 oz (99.428 kg)] 219 lb 3.2 oz (99.428 kg) (07/01 0422)  INTAKE / OUTPUT: Intake/Output     06/30 0701 - 07/01 0700 07/01 0701 - 07/02 0700   P.O. 360    I.V. (mL/kg) 400 (4)    Blood 500    IV Piggyback 650    Total Intake(mL/kg) 1910 (19.2)    Urine (mL/kg/hr) 2405 (1)    Total Output 2405     Net -495          Stool Occurrence 3 x 1 x     PHYSICAL EXAMINATION: General:  Morbidly obese in NAD Neuro:  Awake, alert, MAE, oriented to self but confused otherwise, very talkative HEENT:  Mm pink/dry, no jvd Cardiovascular:  s1s2 rrr, no m/r/g Lungs:  resp's even/non-labored, lungs bilaterally clear Abdomen:  Obese, soft, bsx4 active Musculoskeletal:  No acute deformities , peripheral pulses + Skin:  Warm/dry, 1-2+ pitting edema in LE, good cap refill  LABS:  CBC  Recent Labs Lab 10/09/13 0400 10/11/13 0130 10/11/13 0415  WBC 4.0 4.9 4.3  HGB 7.7* 8.1* 7.6*  HCT 22.6* 24.2* 22.5*  PLT 14* 76* 65*   Coag's  Recent Labs Lab 10/06/13 1017  10/09/13 0400 10/10/13 0410 10/11/13 0415  APTT 49*  --   --   --   --   INR 2.91*  < >  3.60* 1.62* 1.30  < > = values in this interval not displayed. BMET  Recent Labs Lab 10/08/13 0600 10/09/13 0400 10/11/13 0415  NA 132* 133* 140  K 4.2 3.9 2.6*  CL 92* 92* 104  CO2 20 22 20   BUN 79* 85* 74*  CREATININE 3.06* 3.04* 2.24*  GLUCOSE 411* 319* 164*   Electrolytes  Recent Labs Lab 10/06/13 1017  10/08/13 0600 10/09/13 0400 10/11/13 0415  CALCIUM 9.0  < > 7.5* 7.0* 5.8*  MG 2.0  --   --   --   --   PHOS 4.6  --   --   --   --   < > = values in  this interval not displayed. Sepsis Markers  Recent Labs Lab 10/06/13 0713 10/06/13 1017 10/06/13 1450 10/08/13 0600 10/10/13 0410  LATICACIDVEN 1.83  --  4.7*  --   --   PROCALCITON  --  6.24  --  16.52 5.99   ABG No results found for this basename: PHART, PCO2ART, PO2ART,  in the last 168 hours Liver Enzymes  Recent Labs Lab 10/06/13 1017 10/07/13 0425 10/09/13 0400  AST 16 72* 52*  ALT 36* 81* 92*  ALKPHOS 109 150* 107  BILITOT 0.2* 0.5 0.2*  ALBUMIN 2.0* 1.8* 1.4*   Cardiac Enzymes  Recent Labs Lab 10/06/13 0657  10/06/13 1447 10/06/13 2200 10/07/13 0425  TROPONINI  --   < > <0.30 <0.30 <0.30  PROBNP 8619.0*  --   --   --   --   < > = values in this interval not displayed. Glucose  Recent Labs Lab 10/09/13 2108 10/10/13 0741 10/10/13 1235 10/10/13 1544 10/10/13 2155 10/11/13 0717  GLUCAP 220* 198* 364* 267* 198* 177*    Imaging No results found.  ASSESSMENT / PLAN:  PULMONARY A: COPD - without acute exacerbation  P:   Pulmonary hygiene / mobilize PRN BD's Continue spiriva DNI  CARDIOVASCULAR A:  Septic shock - resolved but BP remains soft likely because hydrocortisone d/c'd on 6/30 Atrial Fibrillation with RVR Sick Sinus Syndrome s/p Pacemaker  Baseline Steroid Dependent  P:  Holding anticoagulation in setting of thrombocytopenia; Renew Hydrocortisone today due to surgery Saline bolus now Metoprolol for rate control Continue zocor Will need diuresis for volume removal prior to discharge.   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 - improving R Hydronephrosis s/p R ureteral stent placement 4/3  Hypokalemia Hypocalcemia P:   Urology input regarding stent exchange noted - planned exchange 7/1 Hold allopurinol in setting of recent renal injury Replete KCL, Calcium aggressively 7/1  GASTROINTESTINAL A:   Hx Constipation P:   Carb modified diet NPO p MN on 6/30 Dulcolax QHS   HEMATOLOGIC / ONC  A:   Relapsed,  Refractory high-grade Non-Hodgkin's B-Cell Lymphoma - on salvage therapy of Rituxan + Revlimid as 4th line Rx Anemia Coagulopathy - on comadin for afib, INR 2.86 on admit Thrombocytopenia - platelets ranging in teens, likely secondary to DIC P:  HOLD Coumadin with thrombocytopenia Monitor CBC / INR daily Monitor for spontaneous bleeding  Hold further chemo in setting of sepsis   INFECTIOUS A:   Klebsiella UTI  P:   ABX / Cultures as above Narrowed abx 6/29    ENDOCRINE A:   Diabetes Mellitus - uncontrolled on steroids Hypothyroidism Gout P:   SSI - resistant scale Resume hydrocortisone (stress dose) for surgery 7/1 Continue synthroid Hold allopurinol   NEUROLOGIC A:   Acute  Encephalopathy  P:   Supportive care, promote sleep / wake cycle Minimize sedating medications Mobilize as able  GLOBAL: -Niece is HCPOA -consider goals of care  Attending:  I have personally obtained a history, examined the patient, evaluated laboratory and imaging results, formulated the assessment and plan and placed orders.  Clinically improving but remains critically ill, pending stent replacement 7/1, will need to coordinate transfusions with urology.  Care during the described time interval was provided by me and/or other providers on the critical care team.  I have reviewed this patient's available data, including medical history, events of note, physical examination and test results as part of my evaluation  CC time x 35 min  10/11/2013, 9:52 AM  Roselie Awkward, MD [facility] Pager: [phone] Cell: [phone] If no response, call [phone]

## 2013-10-11 NOTE — Progress Notes (Signed)
PT Cancellation Note  Patient Details Name: [name] MRN: [id number] DOB: 1940/12/22   Cancelled Treatment:    Reason Eval/Treat Not Completed: Patient at procedure or test/unavailable   Claretha Cooper 10/11/2013, 12:09 PM

## 2013-10-11 NOTE — Progress Notes (Signed)
Patient was brought to the OR for double-J stent exchange On the operative table prior to induction of general anesthesia she developed a tachycardia arrhythmia. Anesthesia elected to cancel her surgery.  I spoke to family member.  Dr. Eda Keys will need to arrange for a stent exchange at a later day

## 2013-10-11 NOTE — Interval H&P Note (Signed)
History and Physical Interval Note:  10/11/2013 11:17 AM  [name]  has presented today for surgery, with the diagnosis of RIGHT URETERAL OBSTRUCTION  The various methods of treatment have been discussed with the patient and family. After consideration of risks, benefits and other options for treatment, the patient has consented to  Procedure(s): CYSTOSCOPY WITH STENT REPLACEMENT (Right) as a surgical intervention .  The patient's history has been reviewed, patient examined, no change in status, stable for surgery.  I have reviewed the patient's chart and labs.  Questions were answered to the patient's satisfaction.     [name]

## 2013-10-11 NOTE — Anesthesia Preprocedure Evaluation (Addendum)
Anesthesia Evaluation  Patient identified by MRN, date of birth, ID band Patient awake    Reviewed: Allergy & Precautions, H&P , NPO status , Patient's Chart, lab work & pertinent test results, reviewed documented beta blocker date and time   Airway Mallampati: II TM Distance: >3 FB Neck ROM: full    Dental  (+) Edentulous Upper, Edentulous Lower, Dental Advisory Given   Pulmonary asthma , COPDformer smoker,  breath sounds clear to auscultation  Pulmonary exam normal       Cardiovascular Exercise Tolerance: Poor hypertension, + CAD + dysrhythmias Atrial Fibrillation + pacemaker Rhythm:Irregular Rate:Normal  ECG - AF   Neuro/Psych Acute encephalopathy negative neurological ROS  negative psych ROS   GI/Hepatic negative GI ROS, Neg liver ROS,   Endo/Other  diabetes, Poorly Controlled, Type 2, Insulin DependentHypothyroidism Morbid obesity  Renal/GU CRF and ARFRenal diseasenegative Renal ROS  negative genitourinary   Musculoskeletal   Abdominal (+) + obese,   Peds  Hematology negative hematology ROS (+) anemia , Non-Hodgkins lymphoma   Anesthesia Other Findings K 2.6  Reproductive/Obstetrics negative OB ROS                         Anesthesia Physical Anesthesia Plan  ASA: IV  Anesthesia Plan: MAC   Post-op Pain Management:    Induction:   Airway Management Planned: Simple Face Mask  Additional Equipment:   Intra-op Plan:   Post-operative Plan:   Informed Consent: I have reviewed the patients History and Physical, chart, labs and discussed the procedure including the risks, benefits and alternatives for the proposed anesthesia with the patient or authorized representative who has indicated his/her understanding and acceptance.   Dental Advisory Given  Plan Discussed with: CRNA and Surgeon  Anesthesia Plan Comments:         Anesthesia Quick Evaluation

## 2013-10-11 NOTE — Progress Notes (Signed)
OT Cancellation Note  Patient Details Name: [name] MRN: [id number] DOB: October 26, 1940   Cancelled Treatment:    Reason Eval/Treat Not Completed: Patient at procedure or test/ unavailable.  Will likely check back tomorrow.    Trevor Wilkie 10/11/2013, 11:32 AM Lesle Chris, OTR/L [phone] 10/11/2013

## 2013-10-11 NOTE — Anesthesia Preprocedure Evaluation (Signed)
Anesthesia Evaluation    Airway Mallampati: II TM Distance: >3 FB Neck ROM: Full    Dental no notable dental hx.    Pulmonary asthma , COPDformer smoker,  breath sounds clear to auscultation  Pulmonary exam normal       Cardiovascular hypertension, Pt. on medications + CAD + dysrhythmias Atrial Fibrillation + pacemaker Rhythm:Regular Rate:Normal     Neuro/Psych PSYCHIATRIC DISORDERS Depression    GI/Hepatic   Endo/Other  diabetesHypothyroidism Morbid obesity  Renal/GU Renal disease     Musculoskeletal   Abdominal (+) + obese,   Peds  Hematology  (+) anemia ,   Anesthesia Other Findings   Reproductive/Obstetrics                           Anesthesia Physical Anesthesia Plan  ASA: IV  Anesthesia Plan: MAC   Post-op Pain Management:    Induction: Intravenous  Airway Management Planned:   Additional Equipment:   Intra-op Plan:   Post-operative Plan:   Informed Consent: I have reviewed the patients History and Physical, chart, labs and discussed the procedure including the risks, benefits and alternatives for the proposed anesthesia with the patient or authorized representative who has indicated his/her understanding and acceptance.   Dental advisory given  Plan Discussed with: CRNA  Anesthesia Plan Comments: (Ms. Bicknell with hypotension and afib with RVR currently on neosynephrine drip to maintain pressure. Trying to do this case since last Friday.  Dr. Tresa Moore agrees to  a MAC)        Anesthesia Quick Evaluation

## 2013-10-11 NOTE — H&P (View-Only) (Signed)
Consult: Right ureteral obstruction, sepsis Requested by: Alfonzo Feller, DO   History of Present Illness: 73 year old who was scheduled for routine stent exchange today.  She has right ureteral obstruction from non-Hodgkin's lymphoma and had a right nephrostomy tube internalized to a right double-J stent July 14, 2013. I planned to change her stent early (instead of waiting 6 months) as this is the first change.   She was admitted due to A. Fib, tachycardia and mental status changes.  I saw her earlier today when her Foley catheter was placed and this afternoon she has become progressively septic with hypotension, tachycardia and further mental status declined.  When I saw the patient she was a bit lethargic but knew she needed stent exchange.  She denied any dysuria or gross hematuria.  Past Medical History  Diagnosis Date  . Sick sinus syndrome   . Hematoma     At the site of the pacemaker insertion.  . Hypotension   . Dyslipidemia   . History of atrial fibrillation   . Arthritis     Osteoarthritis  . Port-a-cath in place 12/08/10  . Status post chemotherapy completed 03/2011    R - CHOP q 3 weeks x 6  . S/P radiation therapy 05/28/2011 - 06/26/2011    Right Abdomen and Right Pelvis/3060 cGy in 17 Fractions  . Hypertension   . ASHD (arteriosclerotic heart disease)   . Gout   . COPD (chronic obstructive pulmonary disease)   . Asthma   . Hyperlipidemia   . Diabetes mellitus   . Hypothyroidism   . Diffuse large B cell lymphoma     Dr. Chisom,oncology  . Cancer 10/2010    large B cell lymphoma  . 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 Pacemaker     left chest  . Dysrhythmia     Hx. A. Fib, sick sinus syndrome- Pacemaker inserted.  . Cataracts, bilateral   . Sepsis     08/2013   . Urinary tract infection 08/2013, 10/06/13  . Pancytopenia due to chemotherapy   . Acute renal failure     CKD- Stage III  . Acute encephalopathy     hx  of   . Neutropenia   . Enterococcus UTI 08/2013    Past Surgical History  Procedure Laterality Date  . Pacemaker insertion      Lead revision completed August 04, 2006- left chest G. Lovena Le  . Tubal ligation      Bilateral  . Insert / replace / remove pacemaker    . Abdominal hysterectomy    . Biopsy stomach  11/03/10    Soft Tissue Mass, Biopsy, Right Lower Quadrant Mesenteric Mass- High Grade Non-Hodgkins B Cell Lymphoma  with Flow Cytometry  . Bone biopsy  11/24/10    Bone Marrow, Aspirate Biospy, and clot, Left - No Involvement of Non-Hodgkin's Lymphoma Identified  . Portacath placement Right 06-23-13  . Cholecystectomy      laparoscopic  . Bladder suspension    . Ureteral stent placement  07/14/2013     Home Medications:  Prescriptions prior to admission  Medication Sig Dispense Refill  . albuterol (PROVENTIL) (2.5 MG/3ML) 0.083% nebulizer solution Take 3 mLs (2.5 mg total) by nebulization every 6 (six) hours as needed for shortness of breath.  75 mL  6  . allopurinol (ZYLOPRIM) 100 MG tablet Take 2 tablets (200 mg total) by mouth daily.      . bisacodyl (DULCOLAX) 5 MG EC tablet  Take 5 mg by mouth at bedtime.      . calcitonin, salmon, (MIACALCIN) 200 UNIT/ACT nasal spray Place 1 spray into alternate nostrils daily.      . citalopram (CELEXA) 20 MG tablet Take 20 mg by mouth daily.      Marland Kitchen dexamethasone (DECADRON) 2 MG tablet Take 1 tablet (2 mg total) by mouth 2 (two) times daily with a meal.  30 tablet  0  . diltiazem (DILACOR XR) 180 MG 24 hr capsule Take 180 mg by mouth daily.      . furosemide (LASIX) 20 MG tablet Take 20 mg by mouth daily. For 10 days starting 6/23      . insulin aspart (NOVOLOG) 100 UNIT/ML injection Inject 0-9 Units into the skin 3 (three) times daily with meals. CBG < 70: implement hypoglycemia protocol CBG 70 - 120: 0 units CBG 121 - 150: 1 unit CBG 151 - 200: 2 units CBG 201 - 250: 3 units CBG 251 - 300: 5 units CBG 301 - 350: 7 units CBG 351 - 400:  9 units CBG > 400: call MD.      . levothyroxine (SYNTHROID, LEVOTHROID) 50 MCG tablet Take 50 mcg by mouth daily before breakfast.      . lidocaine-prilocaine (EMLA) cream Apply 1 application topically as needed. Apply to port a cath site one hour before needle stick as needed.      . magnesium oxide (MAG-OX) 400 (241.3 MG) MG tablet Take 0.5 tablets (200 mg total) by mouth daily.  30 tablet  0  . ondansetron (ZOFRAN) 8 MG tablet Take 8 mg by mouth every 12 (twelve) hours as needed for nausea or vomiting.      . polyethylene glycol (MIRALAX / GLYCOLAX) packet Take 17 g by mouth 2 (two) times daily.      . pravastatin (PRAVACHOL) 40 MG tablet Take 40 mg by mouth at bedtime.      Marland Kitchen tiotropium (SPIRIVA) 18 MCG inhalation capsule Place 1 capsule (18 mcg total) into inhaler and inhale daily.  30 capsule  0  . URELLE (URELLE/URISED) 81 MG TABS tablet Take 1 tablet (81 mg total) by mouth 3 (three) times daily as needed for bladder spasms.      Marland Kitchen warfarin (COUMADIN) 2 MG tablet Take 2.5 mg by mouth daily at 6 PM.       Allergies:  Allergies  Allergen Reactions  . Codeine Nausea Only  . Penicillins Other (See Comments)    Unknown    Family History  Problem Relation Age of Onset  . Stroke Mother   . Stroke Father   . Aneurysm Son     Brain  . Cancer Sister      1 sister had vaginal cancer  . Cancer Brother     Prostate   Social History:  reports that she quit smoking about 2 years ago. Her smoking use included Cigarettes. She has a 45 pack-year smoking history. She has never used smokeless tobacco. She reports that she does not drink alcohol or use illicit drugs.  ROS: A complete review of systems was performed.  All systems are negative except for pertinent findings as noted. Review of Systems  All other systems reviewed and are negative.    Physical Exam:  Vital signs in last 24 hours: Temp:  [97.9 F (36.6 C)-100.9 F (38.3 C)] 100.9 F (38.3 C) (06/26 1430) Pulse Rate:   [49-173] 144 (06/26 1430) Resp:  [16-25] 19 (06/26 1430) BP: (77-107)/(32-84) 93/78  mmHg (06/26 1430) SpO2:  [92 %-100 %] 97 % (06/26 1430) Weight:  [92.171 kg (203 lb 3.2 oz)-96.1 kg (211 lb 13.8 oz)] 96.1 kg (211 lb 13.8 oz) (06/26 1100) General:  Alert and oriented, No acute distress HEENT: Normocephalic, atraumatic Neck: No JVD or lymphadenopathy Cardiovascular: Regular rate and rhythm Lungs: Regular rate and effort Abdomen: Soft, nontender, nondistended, no abdominal masses Back: No CVA tenderness Extremities: No edema Neurologic: Grossly intact GU: foley in place, urine cloudy  Laboratory Data:  Results for orders placed during the hospital encounter of 10/06/13 (from the past 24 hour(s))  PRO B NATRIURETIC PEPTIDE     Status: Abnormal   Collection Time    10/06/13  6:57 AM      Result Value Ref Range   Pro B Natriuretic peptide (BNP) 8619.0 (*) 0 - 125 pg/mL  CBC WITH DIFFERENTIAL     Status: Abnormal   Collection Time    10/06/13  6:58 AM      Result Value Ref Range   WBC 7.2  4.0 - 10.5 K/uL   RBC 3.51 (*) 3.87 - 5.11 MIL/uL   Hemoglobin 10.4 (*) 12.0 - 15.0 g/dL   HCT 31.1 (*) 36.0 - 46.0 %   MCV 88.6  78.0 - 100.0 fL   MCH 29.6  26.0 - 34.0 pg   MCHC 33.4  30.0 - 36.0 g/dL   RDW 20.0 (*) 11.5 - 15.5 %   Platelets PLATELET CLUMPS NOTED ON SMEAR, UNABLE TO ESTIMATE  150 - 400 K/uL   Neutrophils Relative % 91 (*) 43 - 77 %   Lymphocytes Relative 3 (*) 12 - 46 %   Monocytes Relative 6  3 - 12 %   Eosinophils Relative 0  0 - 5 %   Basophils Relative 0  0 - 1 %   Neutro Abs 6.6  1.7 - 7.7 K/uL   Lymphs Abs 0.2 (*) 0.7 - 4.0 K/uL   Monocytes Absolute 0.4  0.1 - 1.0 K/uL   Eosinophils Absolute 0.0  0.0 - 0.7 K/uL   Basophils Absolute 0.0  0.0 - 0.1 K/uL   WBC Morphology TOXIC GRANULATION    COMPREHENSIVE METABOLIC PANEL     Status: Abnormal   Collection Time    10/06/13  6:58 AM      Result Value Ref Range   Sodium 129 (*) 137 - 147 mEq/L   Potassium 4.8  3.7 -  5.3 mEq/L   Chloride 89 (*) 96 - 112 mEq/L   CO2 21  19 - 32 mEq/L   Glucose, Bld 327 (*) 70 - 99 mg/dL   BUN 71 (*) 6 - 23 mg/dL   Creatinine, Ser 2.43 (*) 0.50 - 1.10 mg/dL   Calcium 9.3  8.4 - 10.5 mg/dL   Total Protein 6.0  6.0 - 8.3 g/dL   Albumin 2.1 (*) 3.5 - 5.2 g/dL   AST 18  0 - 37 U/L   ALT 41 (*) 0 - 35 U/L   Alkaline Phosphatase 107  39 - 117 U/L   Total Bilirubin 0.4  0.3 - 1.2 mg/dL   GFR calc non Af Amer 19 (*) >90 mL/min   GFR calc Af Amer 22 (*) >90 mL/min  PROTIME-INR     Status: Abnormal   Collection Time    10/06/13  6:58 AM      Result Value Ref Range   Prothrombin Time 30.0 (*) 11.6 - 15.2 seconds   INR 2.86 (*) 0.00 -  1.49  LIPASE, BLOOD     Status: None   Collection Time    10/06/13  6:58 AM      Result Value Ref Range   Lipase 34  11 - 59 U/L  TROPONIN I     Status: None   Collection Time    10/06/13  6:58 AM      Result Value Ref Range   Troponin I <0.30  <0.30 ng/mL  I-STAT CG4 LACTIC ACID, ED     Status: None   Collection Time    10/06/13  7:13 AM      Result Value Ref Range   Lactic Acid, Venous 1.83  0.5 - 2.2 mmol/L  URINALYSIS, ROUTINE W REFLEX MICROSCOPIC     Status: Abnormal   Collection Time    10/06/13  7:55 AM      Result Value Ref Range   Color, Urine YELLOW  YELLOW   APPearance CLOUDY (*) CLEAR   Specific Gravity, Urine 1.012  1.005 - 1.030   pH 6.0  5.0 - 8.0   Glucose, UA 250 (*) NEGATIVE mg/dL   Hgb urine dipstick LARGE (*) NEGATIVE   Bilirubin Urine NEGATIVE  NEGATIVE   Ketones, ur NEGATIVE  NEGATIVE mg/dL   Protein, ur 100 (*) NEGATIVE mg/dL   Urobilinogen, UA 0.2  0.0 - 1.0 mg/dL   Nitrite NEGATIVE  NEGATIVE   Leukocytes, UA MODERATE (*) NEGATIVE  URINE MICROSCOPIC-ADD ON     Status: Abnormal   Collection Time    10/06/13  7:55 AM      Result Value Ref Range   Squamous Epithelial / LPF FEW (*) RARE   WBC, UA 7-10  <3 WBC/hpf   RBC / HPF 11-20  <3 RBC/hpf   Bacteria, UA MANY (*) RARE   Urine-Other MUCOUS PRESENT     PROCALCITONIN     Status: None   Collection Time    10/06/13 10:17 AM      Result Value Ref Range   Procalcitonin 6.24    COMPREHENSIVE METABOLIC PANEL     Status: Abnormal   Collection Time    10/06/13 10:17 AM      Result Value Ref Range   Sodium 132 (*) 137 - 147 mEq/L   Potassium 4.7  3.7 - 5.3 mEq/L   Chloride 91 (*) 96 - 112 mEq/L   CO2 23  19 - 32 mEq/L   Glucose, Bld 341 (*) 70 - 99 mg/dL   BUN 71 (*) 6 - 23 mg/dL   Creatinine, Ser 2.56 (*) 0.50 - 1.10 mg/dL   Calcium 9.0  8.4 - 10.5 mg/dL   Total Protein 5.5 (*) 6.0 - 8.3 g/dL   Albumin 2.0 (*) 3.5 - 5.2 g/dL   AST 16  0 - 37 U/L   ALT 36 (*) 0 - 35 U/L   Alkaline Phosphatase 109  39 - 117 U/L   Total Bilirubin 0.2 (*) 0.3 - 1.2 mg/dL   GFR calc non Af Amer 18 (*) >90 mL/min   GFR calc Af Amer 20 (*) >90 mL/min  PHOSPHORUS     Status: None   Collection Time    10/06/13 10:17 AM      Result Value Ref Range   Phosphorus 4.6  2.3 - 4.6 mg/dL  MAGNESIUM     Status: None   Collection Time    10/06/13 10:17 AM      Result Value Ref Range   Magnesium 2.0  1.5 - 2.5  mg/dL  CBC WITH DIFFERENTIAL     Status: Abnormal   Collection Time    10/06/13 10:17 AM      Result Value Ref Range   WBC 6.2  4.0 - 10.5 K/uL   RBC 3.28 (*) 3.87 - 5.11 MIL/uL   Hemoglobin 9.7 (*) 12.0 - 15.0 g/dL   HCT 29.1 (*) 36.0 - 46.0 %   MCV 88.7  78.0 - 100.0 fL   MCH 29.6  26.0 - 34.0 pg   MCHC 33.3  30.0 - 36.0 g/dL   RDW 20.1 (*) 11.5 - 15.5 %   Platelets 24 (*) 150 - 400 K/uL   Neutrophils Relative % 93 (*) 43 - 77 %   Lymphocytes Relative 4 (*) 12 - 46 %   Monocytes Relative 3  3 - 12 %   Eosinophils Relative 0  0 - 5 %   Basophils Relative 0  0 - 1 %   Neutro Abs 5.8  1.7 - 7.7 K/uL   Lymphs Abs 0.2 (*) 0.7 - 4.0 K/uL   Monocytes Absolute 0.2  0.1 - 1.0 K/uL   Eosinophils Absolute 0.0  0.0 - 0.7 K/uL   Basophils Absolute 0.0  0.0 - 0.1 K/uL   WBC Morphology INCREASED BANDS (>20% BANDS)    APTT     Status: Abnormal    Collection Time    10/06/13 10:17 AM      Result Value Ref Range   aPTT 49 (*) 24 - 37 seconds  PROTIME-INR     Status: Abnormal   Collection Time    10/06/13 10:17 AM      Result Value Ref Range   Prothrombin Time 30.4 (*) 11.6 - 15.2 seconds   INR 2.91 (*) 0.00 - 1.49  TSH     Status: Abnormal   Collection Time    10/06/13 10:17 AM      Result Value Ref Range   TSH 0.105 (*) 0.350 - 4.500 uIU/mL  MRSA PCR SCREENING     Status: None   Collection Time    10/06/13 11:08 AM      Result Value Ref Range   MRSA by PCR NEGATIVE  NEGATIVE  GLUCOSE, CAPILLARY     Status: Abnormal   Collection Time    10/06/13 12:13 PM      Result Value Ref Range   Glucose-Capillary 333 (*) 70 - 99 mg/dL   Comment 1 Notify RN     Recent Results (from the past 240 hour(s))  MRSA PCR SCREENING     Status: None   Collection Time    10/06/13 11:08 AM      Result Value Ref Range Status   MRSA by PCR NEGATIVE  NEGATIVE Final   Comment:            The GeneXpert MRSA Assay (FDA     approved for NASAL specimens     only), is one component of a     comprehensive MRSA colonization     surveillance program. It is not     intended to diagnose MRSA     infection nor to guide or     monitor treatment for     MRSA infections.   Creatinine:  Recent Labs  10/06/13 0658 10/06/13 1017  CREATININE 2.43* 2.56*   KUB-stent is in good position on the right without obvious stones.  Impression/Assessment/plan: Sepsis - ? Urinary source. I discussed with my partner Dr. Risa Grill who agreed to perform stent  exchange in the morning if patient stabilizes.  I sent a KUB to ensure stent is in proper position and has no stone on it. KUB looks good.  The KUB will not give [place] any information about hydronephrosis/obstruction.  She would require renal ultrasound or CT.  If she has significant hydronephrosis consideration could be made a right percutaneous nephrostomy tube to temporize the patient.  She required percutaneous  nephrostomy tube in the past for urosepsis. I dicsussed patient with Dr. Janice Norrie, Dr. Risa Grill and NP Alfredo Martinez.  Festus Aloe 10/06/2013, 2:41 PM

## 2013-10-11 NOTE — Progress Notes (Signed)
[facility] Progress Note Patient Name: [name] DOB: 05/01/1940 MRN: [id number]  Date of Service  10/11/2013   HPI/Events of Note     eICU Interventions  NS bolus for borderline hypotension   Intervention Category Intermediate Interventions: Hypotension - evaluation and management  Merton Border 10/11/2013, 1:29 AM

## 2013-10-11 NOTE — Progress Notes (Signed)
[facility]  Afib with RVR and hypotension prevented operation this morning  I feel this is mostly due to hydrocortisone stopping yesterday  Added back hydrocortisone, dilt gtt, neo if needed  Plan return to OR this afternoon  Roselie Awkward, MD [facility] Pager: [phone] Cell: [phone] If no response, call [phone]

## 2013-10-11 NOTE — Anesthesia Postprocedure Evaluation (Signed)
  Anesthesia Post-op Note  Patient: [name]  Procedure(s) Performed: Procedure(s) (LRB): CYSTOSCOPY WITH URETERAL STENT EXCHANGE (Right)  Patient Location: ICU  Anesthesia Type: MAC  Level of Consciousness: awake and alert   Airway and Oxygen Therapy: Patient Spontanous Breathing  Post-op Pain: mild  Post-op Assessment: Post-op Vital signs reviewed Respiratory Function Stable, Patent Airway and No signs of Nausea or vomiting  Last Vitals:  Filed Vitals:   10/11/13 1600  BP:   Pulse:   Temp: 37 C  Resp:     Post-op Vital Signs: unstable. She was returned to ICU on neosynephrine drip same as preoperative status.   Complications: No apparent anesthesia complications

## 2013-10-11 NOTE — Transfer of Care (Signed)
Immediate Anesthesia Transfer of Care Note  Patient: [name]   Procedure(s) Performed: Procedure(s): CYSTOSCOPY WITH STENT REPLACEMENT (Right)  Patient Location: PACU and ICU  Anesthesia Type:none  Level of Consciousness: awake and alert   Airway & Oxygen Therapy: Patient Spontanous Breathing  Post-op Assessment: Post -op Vital signs reviewed and stable  Post vital signs: Reviewed and stable  Complications: No apparent anesthesia complications

## 2013-10-12 ENCOUNTER — Other Ambulatory Visit: Payer: Medicare Other

## 2013-10-12 ENCOUNTER — Ambulatory Visit: Payer: Medicare Other

## 2013-10-12 ENCOUNTER — Encounter (HOSPITAL_COMMUNITY): Payer: Self-pay | Admitting: Urology

## 2013-10-12 DIAGNOSIS — E8809 Other disorders of plasma-protein metabolism, not elsewhere classified: Secondary | ICD-10-CM

## 2013-10-12 DIAGNOSIS — N1330 Unspecified hydronephrosis: Secondary | ICD-10-CM

## 2013-10-12 DIAGNOSIS — E039 Hypothyroidism, unspecified: Secondary | ICD-10-CM

## 2013-10-12 DIAGNOSIS — D649 Anemia, unspecified: Secondary | ICD-10-CM

## 2013-10-12 DIAGNOSIS — I959 Hypotension, unspecified: Secondary | ICD-10-CM

## 2013-10-12 DIAGNOSIS — D696 Thrombocytopenia, unspecified: Secondary | ICD-10-CM

## 2013-10-12 DIAGNOSIS — E785 Hyperlipidemia, unspecified: Secondary | ICD-10-CM

## 2013-10-12 DIAGNOSIS — R000 Tachycardia, unspecified: Secondary | ICD-10-CM

## 2013-10-12 LAB — CBC WITH DIFFERENTIAL/PLATELET
BASOS PCT: 0 % (ref 0–1)
Basophils Absolute: 0 10*3/uL (ref 0.0–0.1)
EOS ABS: 0 10*3/uL (ref 0.0–0.7)
EOS PCT: 0 % (ref 0–5)
HEMATOCRIT: 23.4 % — AB (ref 36.0–46.0)
Hemoglobin: 7.8 g/dL — ABNORMAL LOW (ref 12.0–15.0)
Lymphocytes Relative: 8 % — ABNORMAL LOW (ref 12–46)
Lymphs Abs: 0.3 10*3/uL — ABNORMAL LOW (ref 0.7–4.0)
MCH: 29.7 pg (ref 26.0–34.0)
MCHC: 33.3 g/dL (ref 30.0–36.0)
MCV: 89 fL (ref 78.0–100.0)
MONO ABS: 0.2 10*3/uL (ref 0.1–1.0)
Monocytes Relative: 4 % (ref 3–12)
Neutro Abs: 3.8 10*3/uL (ref 1.7–7.7)
Neutrophils Relative %: 89 % — ABNORMAL HIGH (ref 43–77)
Platelets: 54 10*3/uL — ABNORMAL LOW (ref 150–400)
RBC: 2.63 MIL/uL — ABNORMAL LOW (ref 3.87–5.11)
RDW: 20.4 % — AB (ref 11.5–15.5)
WBC: 4.3 10*3/uL (ref 4.0–10.5)

## 2013-10-12 LAB — BASIC METABOLIC PANEL
ANION GAP: 15 (ref 5–15)
BUN: 73 mg/dL — ABNORMAL HIGH (ref 6–23)
CALCIUM: 6.6 mg/dL — AB (ref 8.4–10.5)
CO2: 23 mEq/L (ref 19–32)
CREATININE: 2.34 mg/dL — AB (ref 0.50–1.10)
Chloride: 95 mEq/L — ABNORMAL LOW (ref 96–112)
GFR calc Af Amer: 23 mL/min — ABNORMAL LOW (ref >90)
GFR calc non Af Amer: 20 mL/min — ABNORMAL LOW (ref >90)
Glucose, Bld: 246 mg/dL — ABNORMAL HIGH (ref 70–99)
Potassium: 3.6 mEq/L — ABNORMAL LOW (ref 3.7–5.3)
Sodium: 133 mEq/L — ABNORMAL LOW (ref 137–147)

## 2013-10-12 LAB — GLUCOSE, CAPILLARY
Glucose-Capillary: 256 mg/dL — ABNORMAL HIGH (ref 70–99)
Glucose-Capillary: 304 mg/dL — ABNORMAL HIGH (ref 70–99)
Glucose-Capillary: 245 mg/dL — ABNORMAL HIGH (ref 70–99)
Glucose-Capillary: 233 mg/dL — ABNORMAL HIGH (ref 70–99)
Glucose-Capillary: 263 mg/dL — ABNORMAL HIGH (ref 70–99)

## 2013-10-12 LAB — PROTIME-INR
INR: 1.17 (ref 0.00–1.49)
Prothrombin Time: 14.9 seconds (ref 11.6–15.2)

## 2013-10-12 MED ORDER — HYDROCORTISONE SOD SUCCINATE 100 MG IJ SOLR
50.0000 mg | Freq: Two times a day (BID) | INTRAMUSCULAR | Status: DC
  Administered 2013-10-12 – 2013-10-16 (×7): 50 mg via INTRAVENOUS
  Filled 2013-10-12: qty 1
  Filled 2013-10-12: qty 2
  Filled 2013-10-12: qty 1
  Filled 2013-10-12: qty 2
  Filled 2013-10-12 (×2): qty 1
  Filled 2013-10-12: qty 2
  Filled 2013-10-12 (×4): qty 1
  Filled 2013-10-12 (×2): qty 2
  Filled 2013-10-12 (×2): qty 1

## 2013-10-12 MED ORDER — POTASSIUM CHLORIDE CRYS ER 20 MEQ PO TBCR
40.0000 meq | EXTENDED_RELEASE_TABLET | Freq: Once | ORAL | Status: AC
  Administered 2013-10-12: 40 meq via ORAL
  Filled 2013-10-12: qty 2

## 2013-10-12 MED ORDER — CIPROFLOXACIN IN D5W 400 MG/200ML IV SOLN: 400.0000 mg | INTRAVENOUS | Status: DC

## 2013-10-12 MED ORDER — VANCOMYCIN HCL IN DEXTROSE 1-5 GM/200ML-% IV SOLN: 1000.0000 mg | INTRAVENOUS | Status: DC

## 2013-10-12 MED ORDER — SODIUM CHLORIDE 0.9 % IV SOLN
1.0000 g | Freq: Once | INTRAVENOUS | Status: AC
  Administered 2013-10-12: 1 g via INTRAVENOUS
  Filled 2013-10-12: qty 10

## 2013-10-12 MED ORDER — DILTIAZEM HCL ER COATED BEADS 180 MG PO CP24
180.0000 mg | ORAL_CAPSULE | Freq: Every day | ORAL | Status: DC
  Administered 2013-10-12 – 2013-10-13 (×2): 180 mg via ORAL
  Filled 2013-10-12 (×2): qty 1

## 2013-10-12 NOTE — Progress Notes (Signed)
Inpatient Diabetes Program Recommendations  AACE/ADA: New Consensus Statement on Inpatient Glycemic Control (2013)  Target Ranges:  Prepandial:   less than 140 mg/dL      Peak postprandial:   less than 180 mg/dL (1-2 hours)      Critically ill patients:  140 - 180 mg/dL   Reason for Visit: Hyperglycemia  Results for YEILYN, GENT (MRN [id number]) as of 10/12/2013 15:52  Ref. Range 10/12/2013 04:45  Sodium Latest Range: 136-145 mEq/L 133 (L)  Potassium Latest Range: 3.5-5.1 mEq/L 3.6 (L)  Chloride Latest Range: 96-112 mEq/L 95 (L)  CO2 Latest Range: 19-32 mEq/L 23  BUN Latest Range: 7.0-26.0 mg/dL 73 (H)  Creatinine Latest Range: 0.50-1.10 mg/dL 2.34 (H)  Calcium Latest Range: 8.4-10.5 mg/dL 6.6 (L)  GFR calc non Af Amer Latest Range: >90 mL/min 20 (L)  GFR calc Af Amer Latest Range: >90 mL/min 23 (L)  Glucose Latest Range: 70-99 mg/dl 246 (H)  Anion gap Latest Range: 5-15  15   Results for FRIDA, WAHLSTROM (MRN [id number]) as of 10/12/2013 15:52  Ref. Range 10/11/2013 17:30 10/11/2013 21:12 10/12/2013 04:45 10/12/2013 07:56 10/12/2013 13:18  Glucose-Capillary Latest Range: 70-99 mg/dL 248 (H) 433 (H) 256 (H) 304 (H) 245 (H)   Inpatient Diabetes Program Recommendations Insulin - Basal: Increase Lantus to 25 units QAM Correction (SSI): Change Novolog to Q4H while NPO  Note: Will continue to follow. Thank you. Lorenda Peck, RD, LDN, CDE Inpatient Diabetes Coordinator [phone]

## 2013-10-12 NOTE — Progress Notes (Signed)
CSW assisting with d/c planning. Met with pt / son today. Son is requesting assistance with new HCPOA and will for pt. Shift assessment indicates pt has some confusion. CSW recommended waiting until pt is medically stable with improved mental status prior to making these changes. Pt's son agreed with this plan. Pt presently has HCPOA in place.  Werner Lean LCSW [phone]

## 2013-10-12 NOTE — Evaluation (Signed)
Physical Therapy Evaluation Patient Details Name: [name] MRN: [id number] DOB: Jan 31, 1941 Today's Date: 10/12/2013   History of Present Illness  73 yo female admitted with acute on chronic renal failure, ,  Hx of SSS-pacemeaker, Afib, DM, COPD, gout, lymphoma. S/P ureter stent .  Clinical Impression  Pt unable to stand to pivot today. Made several efforts. Pt  Will benefit from PT while in acute care to address problems listed in chart below.     Follow Up Recommendations SNF;Supervision/Assistance - 24 hour    Equipment Recommendations  None recommended by PT    Recommendations for Other Services       Precautions / Restrictions Precautions Precautions: Fall Restrictions Weight Bearing Restrictions: No      Mobility  Bed Mobility Overal bed mobility: Needs Assistance Bed Mobility: Supine to Sit Rolling: Max assist   Supine to sit: Mod assist Sit to supine: +2 for physical assistance;Total assist   General bed mobility comments: able to turn and reach for rails  Transfers Overall transfer level: Needs assistance Equipment used: Rolling walker (2 wheeled) Transfers: Sit to/from Stand Sit to Stand: +2 physical assistance;+2 safety/equipment;Max assist         General transfer comment: attempted to stand at [facility] with 2 person handhold but unable to clear bed to safely stand and pivot  Ambulation/Gait                Stairs            Wheelchair Mobility    Modified Rankin (Stroke Patients Only)       Balance Overall balance assessment: Needs assistance;History of Falls Sitting-balance support: No upper extremity supported;Feet supported   Sitting balance - Comments: difficult to assess  due to body habitus                                     Pertinent Vitals/Pain HR in 110's    Home Living Family/patient expects to be discharged to:: Unsure, no family present.                 Additional Comments: pt reports  she uses walker in the bathroom and electic scooter in home, pt reports she does not get out in the community much.    Prior Function Level of Independence: Independent with assistive device(s)               Hand Dominance        Extremity/Trunk Assessment   Upper Extremity Assessment: Defer to OT evaluation       LUE Deficits / Details: AAROM to 90; strength 3-/5   Lower Extremity Assessment: Generalized weakness         Communication   Communication: No difficulties  Cognition Arousal/Alertness: Awake/alert Behavior During Therapy: WFL for tasks assessed/performed Overall Cognitive Status: Difficult to assess                      General Comments      Exercises        Assessment/Plan    PT Assessment Patient needs continued PT services  PT Diagnosis Difficulty walking;Generalized weakness   PT Problem List Decreased strength;Decreased activity tolerance;Decreased mobility;Obesity;Decreased knowledge of use of DME;Decreased safety awareness;Decreased knowledge of precautions  PT Treatment Interventions DME instruction;Gait training;Functional mobility training;Therapeutic activities;Therapeutic exercise;Patient/family education   PT Goals (Current goals can be found in the Care Plan section) Acute  Rehab PT Goals Patient Stated Goal: unable to state PT Goal Formulation: With patient Time For Goal Achievement: 10/26/13 Potential to Achieve Goals: Good    Frequency Min 3X/week   Barriers to discharge Decreased caregiver support      Co-evaluation PT/OT/SLP Co-Evaluation/Treatment: Yes Reason for Co-Treatment: Complexity of the patient's impairments (multi-system involvement);For patient/therapist safety PT goals addressed during session: Mobility/safety with mobility         End of Session   Activity Tolerance: Patient limited by fatigue Patient left: in bed;with call bell/phone within reach;with bed alarm set Nurse Communication:  Mobility status;Need for lift equipment         Time: 1145-1204 PT Time Calculation (min): 19 min   Charges:   PT Evaluation $Initial PT Evaluation Tier I: 1 Procedure PT Treatments $Therapeutic Activity: 8-22 mins   PT G Codes:          Claretha Cooper 10/12/2013, 1:04 PM Tresa Endo PT [phone]

## 2013-10-12 NOTE — Evaluation (Addendum)
Occupational Therapy Evaluation Patient Details Name: [name] MRN: [id number] DOB: 22-Aug-1940 Today's Date: 10/12/2013    History of Present Illness 73 yo female admitted with acute on chronic renal failure, ,  Hx of SSS-pacemeaker, Afib, DM, COPD, gout, lymphoma. S/P ureter stent .   Clinical Impression   Pt was admitted for the above.  She was at a SNF prior to admission--PLOF is unknown.  Pt had difficulty expressing herself and demonstrated decreased attention, memory, safety during eval.  She will benefit from skilled OT to increase strength, endurance and standing balance for ADLs to decrease burden of care at next venue.    Follow Up Recommendations  SNF    Equipment Recommendations  None recommended by OT    Recommendations for Other Services       Precautions / Restrictions Precautions Precautions: Fall Restrictions Weight Bearing Restrictions: No      Mobility Bed Mobility Overal bed mobility: Needs Assistance Bed Mobility: Supine to Sit Rolling: Max assist   Supine to sit: Mod assist Sit to supine: +2 for physical assistance;Total assist   General bed mobility comments: able to turn and reach for rails  Transfers Overall transfer level: Needs assistance Equipment used: Rolling walker (2 wheeled) Transfers: Sit to/from Stand Sit to Stand: +2 physical assistance;+2 safety/equipment;Max assist         General transfer comment: attempted to stand at [facility] with 2 person handhold but unable to clear bed to safely stand and pivot    Balance Overall balance assessment: Needs assistance;History of Falls Sitting-balance support: No upper extremity supported;Feet supported   Sitting balance - Comments: difficult to assess  due to body habitus                                    ADL Overall ADL's : Needs assistance/impaired     Grooming: Wash/dry hands;Wash/dry face;Set up;Sitting   Upper Body Bathing: Moderate assistance;Sitting    Lower Body Bathing: Total assistance;+2 for physical assistance;Bed level   Upper Body Dressing : Moderate assistance;Sitting   Lower Body Dressing: Total assistance;+2 for physical assistance;Bed level       Toileting- Clothing Manipulation and Hygiene: Total assistance;+2 for physical assistance;Bed level         General ADL Comments: attempted to stand 3x and attempted squat pivot transfer once:  unable to safely perform with A x 2.  Rolled to perform hygiene as pt was incontinent x bowel     Vision                     Perception     Praxis      Pertinent Vitals/Pain No c/o pain.  VSS     Hand Dominance     Extremity/Trunk Assessment Upper Extremity Assessment Upper Extremity Assessment: Generalized weakness LUE Deficits / Details: AAROM to 90; strength 3-/5   Lower Extremity Assessment Lower Extremity Assessment: Generalized weakness       Communication Communication Communication: No difficulties   Cognition Arousal/Alertness: Awake/alert Behavior During Therapy: WFL for tasks assessed/performed Overall Cognitive Status: Difficult to assess                     General Comments       Exercises       Shoulder Instructions      Home Living Family/patient expects to be discharged to:: Unsure  Additional Comments: pt reports she uses walker in the bathroom and electic scooter in home, pt reports she does not get out in the community much.      Prior Functioning/Environment Level of Independence: Independent with assistive device(s)             OT Diagnosis: Generalized weakness   OT Problem List: Decreased strength;Decreased activity tolerance;Impaired balance (sitting and/or standing);Decreased cognition   OT Treatment/Interventions: Self-care/ADL training;DME and/or AE instruction;Balance training;Patient/family education;Therapeutic activities;Cognitive remediation/compensation     OT Goals(Current goals can be found in the care plan section) Acute Rehab OT Goals Patient Stated Goal: unable to state OT Goal Formulation: With patient Time For Goal Achievement: 10/26/13 Potential to Achieve Goals: Good ADL Goals Additional ADL Goal #1: pt will sit eob x 15 minutes with supervision and perform ub adls with min A Additional ADL Goal #2: pt will perform sit to stand with max A x 2 for adls and maintain for 30 seconds with mod A Additional ADL Goal #3: pt will perform bil UE AROM x 10 reps to increase strength for adls with no more than 2 cues for each exercise per extremity  OT Frequency: Min 2X/week   Barriers to D/C:            Co-evaluation PT/OT/SLP Co-Evaluation/Treatment: Yes Reason for Co-Treatment: Complexity of the patient's impairments (multi-system involvement) PT goals addressed during session: Mobility/safety with mobility OT goals addressed during session: ADL's and self-care      End of Session Nurse Communication: Need for lift equipment  Activity Tolerance: Patient limited by fatigue Patient left: in bed;with call bell/phone within reach;with bed alarm set   Time: 1132-1204 OT Time Calculation (min): 32 min Charges:  OT General Charges $OT Visit: 1 Procedure OT Evaluation $Initial OT Evaluation Tier I: 1 Procedure OT Treatments $Self Care/Home Management : 8-22 mins G-Codes:    Nubia Ziesmer Oct 27, 2013, 1:34 PM Lesle Chris, OTR/L [phone] 10-27-13

## 2013-10-12 NOTE — Progress Notes (Signed)
PULMONARY / CRITICAL CARE MEDICINE   Name: [name] MRN: [id number] DOB: [birth year]    ADMISSION DATE:  10/06/2013 CONSULTATION DATE:  10/06/13  REFERRING MD :  Dr. Charlies Silvers  PRIMARY SERVICE: TRH  CHIEF COMPLAINT:  Urosepsis   BRIEF PATIENT DESCRIPTION: 73 y/o F, SNF Resident,  with PMH of HTN, HLD, recurrent lymphoma, COPD, CKD, R ureteral stent placement (4/3) and recent admission for Enterococcus UTI (5/20-5/28) who was admitted on 6/26 with recurrent sepsis / UTI.  PCCM consulted for evaluation.    SIGNIFICANT EVENTS: 10/2010 - Dx with NHL 04/2011 - Chemo R CHOP + XRT with good response 10/2012 - Recurrence, Rx with salvage chemo 05/2013 - Recurrence, CT A/P >> increased LAN with severe R hydro 06/2013 - Admission for sepsis, pyelo 07/14/13 - R ureteral stent placement  5/20-5/28 - Admission for Enterococcus UTI / Sepsis. AKI/CKD- multi-factorial with R-sided hydro, nephrotoxic agents, urosepsis, hypercalcemia, and volume depletion related to diarrhea as well as nephrotoxic agents. Improved with IVF's. Creatinine as high as 7.6 down to 2.58 on d/c. She underwent renal ultrasound which showed right stent in place, no hydronephrosis on the right or the left. ...................................................................................................................................................... 6/26 - Readmit with c/o fever, increased swelling, periods of hypoglycemia.  UTI +, Sepsis 6/29 - Resolved sepsis, off pressors.  Significant thrombocytopenia 7/1  - ureteral stent change, transiently needed pressors   STUDIES:  6/26 renal US >> no hydronephrosis, stent shadow  LINES / TUBES: R CW Port >>>  CULTURES: Hx Enterococcus UTI 5/20>> sens Vanco, nitrofurantioin BCx2 6/26 >>klebsiella>>sens zosyn UC 6/26 >>klebsiella>>sens zosyn   ANTIBIOTICS: Rocephin 6/26 >>>x1 Zosyn 6/26 >>>6/29 Vanco 6/26 >>> 6/28 Rocephin 6/29 >>   SUBJECTIVE: Doing well this morning after R  ureteral stent exchange  VITAL SIGNS: Temp:  [97 F (36.1 C)-99 F (37.2 C)] 98.1 F (36.7 C) (07/02 0800) Pulse Rate:  [39-72] 70 (07/02 0610) Resp:  [9-18] 15 (07/02 0610) BP: (45-119)/(14-66) 100/49 mmHg (07/02 0610) SpO2:  [96 %-100 %] 97 % (07/02 0851) Weight:  [99.746 kg (219 lb 14.4 oz)] 99.746 kg (219 lb 14.4 oz) (07/02 0500)  INTAKE / OUTPUT: Intake/Output     07/01 0701 - 07/02 0700 07/02 0701 - 07/03 0700   P.O.     I.V. (mL/kg) 1585.4 (15.9)    Blood     Other 1250    IV Piggyback 510    Total Intake(mL/kg) 3345.4 (33.5)    Urine (mL/kg/hr) 1600 (0.7)    Total Output 1600     Net +1745.4          Stool Occurrence 1 x      PHYSICAL EXAMINATION: General:  NAD Neuro:  Awake alert, confused, follows commands HEENT:  Mm pink/dry, no jvd Cardiovascular:  RRR, no mgr Lungs:  CTA B Abdomen:  Obese, soft, bsx4 active Ext: edema in bilateral lower ext  LABS:  CBC  Recent Labs Lab 10/11/13 0130 10/11/13 0415 10/12/13 0445  WBC 4.9 4.3 4.3  HGB 8.1* 7.6* 7.8*  HCT 24.2* 22.5* 23.4*  PLT 76* 65* 54*   Coag's  Recent Labs Lab 10/06/13 1017  10/10/13 0410 10/11/13 0415 10/12/13 0445  APTT 49*  --   --   --   --   INR 2.91*  < > 1.62* 1.30 1.17  < > = values in this interval not displayed. BMET  Recent Labs Lab 10/09/13 0400 10/11/13 0415 10/12/13 0445  NA 133* 140 133*  K 3.9 2.6* 3.6*  CL 92* 104  95*  CO2 22 20 23   BUN 85* 74* 73*  CREATININE 3.04* 2.24* 2.34*  GLUCOSE 319* 164* 246*   Electrolytes  Recent Labs Lab 10/06/13 1017  10/09/13 0400 10/11/13 0415 10/12/13 0445  CALCIUM 9.0  < > 7.0* 5.8* 6.6*  MG 2.0  --   --   --   --   PHOS 4.6  --   --   --   --   < > = values in this interval not displayed. Sepsis Markers  Recent Labs Lab 10/06/13 0713 10/06/13 1017 10/06/13 1450 10/08/13 0600 10/10/13 0410  LATICACIDVEN 1.83  --  4.7*  --   --   PROCALCITON  --  6.24  --  16.52 5.99   ABG No results found for this  basename: PHART, PCO2ART, PO2ART,  in the last 168 hours Liver Enzymes  Recent Labs Lab 10/06/13 1017 10/07/13 0425 10/09/13 0400  AST 16 72* 52*  ALT 36* 81* 92*  ALKPHOS 109 150* 107  BILITOT 0.2* 0.5 0.2*  ALBUMIN 2.0* 1.8* 1.4*   Cardiac Enzymes  Recent Labs Lab 10/06/13 0657  10/06/13 1447 10/06/13 2200 10/07/13 0425  TROPONINI  --   < > <0.30 <0.30 <0.30  PROBNP 8619.0*  --   --   --   --   < > = values in this interval not displayed. 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 R Hydronephrosis s/p R ureteral stent placement 4/3 now exchanged Hypokalemia Hypocalcemia P:   Hold allopurinol in setting of recent renal injury Replete KCL, Calcium  7/2  GASTROINTESTINAL A:   Hx Constipation P:   Carb modified diet Dulcolax QHS   HEMATOLOGIC / ONC  A:   Relapsed, Refractory high-grade Non-Hodgkin's B-Cell Lymphoma - on salvage therapy of Rituxan + Revlimid as 4th line Rx Anemia Coagulopathy - on comadin for afib, INR 2.86 on admit Thrombocytopenia - stable P:  HOLD Coumadin with thrombocytopenia Monitor CBC / INR daily Monitor for spontaneous bleeding  Hold further chemo in setting of sepsis   INFECTIOUS A:   Klebsiella UTI  P:   ABX / Cultures as above Plan 14 day course of antibiotics   ENDOCRINE A:   Diabetes Mellitus - uncontrolled on  steroids Hypothyroidism Gout P:   SSI - resistant scale Wean hydrocortisone 7/2, consider resume decadron 7/3 if BP OK Continue synthroid Hold allopurinol   NEUROLOGIC A:   Acute Encephalopathy  P:   Supportive care, promote sleep / wake cycle Minimize sedating medications Mobilize as able  GLOBAL: -Niece is HCPOA -consider goals of care  Attending:  I have personally obtained a history, examined the patient, evaluated laboratory and imaging results, formulated the assessment and plan and placed orders.  Tolerated stent exchange on 7/2, did well  Ready for transfer to SDU, likely out to SNF next week   10/12/2013, 9:41 AM  Roselie Awkward, MD [place]  PCCM Pager: [phone] Cell: [phone] If no response, call [phone]

## 2013-10-12 NOTE — Progress Notes (Signed)
[name]   DOB:[birth year]   PZ#:[id number]   EUM#:[id number]  Subjective: Patient had a drop in her blood pressure and was on blood pressure support transiently.  She appears non-toxic appearing but intermittently confused. She tolerates po without difficulty.    Objective:  Filed Vitals:   10/12/13 1700  BP:   Pulse: 69  Temp:   Resp: 18    Body mass index is 44.39 kg/(m^2).  Intake/Output Summary (Last 24 hours) at 10/12/13 1707 Last data filed at 10/12/13 1600  Gross per 24 hour  Intake 1317.5 ml  Output   3150 ml  Net -1832.5 ml    Oriented to self.   Sclerae unicteric  Oropharynx clear  No peripheral adenopathy  Lungs clear -- no rales or rhonchi  Regular rate and rhythm S1 S2 without murmurs  Abdomen obese, NTTP +BS  MSK trace edema bilaterally  Neuro, moves all her extremities.     CBG (last 3)   Recent Labs  10/12/13 0756 10/12/13 1318 10/12/13 1629  GLUCAP 304* 245* 233*     Labs:  Lab Results  Component Value Date   WBC 4.3 10/12/2013   HGB 7.8* 10/12/2013   HCT 23.4* 10/12/2013   MCV 89.0 10/12/2013   PLT 54* 10/12/2013   NEUTROABS 3.8 08/14/84    Basic Metabolic Panel:  Recent Labs Lab 10/06/13 0658 10/06/13 1017 10/07/13 0425 10/08/13 0600 10/09/13 0400 10/11/13 0415 10/12/13 0445  NA 129* 132* 129* 132* 133* 140 133*  K 4.8 4.7 4.2 4.2 3.9 2.6* 3.6*  CL 89* 91* 91* 92* 92* 104 95*  CO2 21 23 21 20 22 20 23   GLUCOSE 327* 341* 242* 411* 319* 164* 246*  BUN 71* 71* 68* 79* 85* 74* 73*  CREATININE 2.43* 2.56* 2.79* 3.06* 3.04* 2.24* 2.34*  CALCIUM 9.3 9.0 8.3* 7.5* 7.0* 5.8* 6.6*  MG  --  2.0  --   --   --   --   --   PHOS  --  4.6  --   --   --   --   --    GFR Estimated Creatinine Clearance: 22.6 ml/min (by C-G formula based on Cr of 2.34). Liver Function Tests:  Recent Labs Lab 10/06/13 0658 10/06/13 1017 10/07/13 0425 10/09/13 0400  AST 18 16 72* 52*  ALT 41* 36* 81* 92*  ALKPHOS 107 109 150* 107  BILITOT 0.4 0.2* 0.5  0.2*  PROT 6.0 5.5* 5.5* 4.8*  ALBUMIN 2.1* 2.0* 1.8* 1.4*    Recent Labs Lab 10/06/13 0658  LIPASE 34   No results found for this basename: AMMONIA,  in the last 168 hours Coagulation profile  Recent Labs Lab 10/08/13 0600 10/09/13 0400 10/10/13 0410 10/11/13 0415 10/12/13 0445  INR 3.98* 3.60* 1.62* 1.30 1.17    CBC:  Recent Labs Lab 10/06/13 0658 10/06/13 1017  10/08/13 0600 10/09/13 0400 10/11/13 0130 10/11/13 0415 10/12/13 0445  WBC 7.2 6.2  < > 6.2 4.0 4.9 4.3 4.3  NEUTROABS 6.6 5.8  --   --   --   --   --  3.8  HGB 10.4* 9.7*  < > 8.6* 7.7* 8.1* 7.6* 7.8*  HCT 31.1* 29.1*  < > 25.7* 22.6* 24.2* 22.5* 23.4*  MCV 88.6 88.7  < > 86.8 85.9 86.7 87.2 89.0  PLT PLATELET CLUMPS NOTED ON SMEAR, UNABLE TO ESTIMATE 24*  < > 13* 14* 76* 65* 54*  < > = values in this interval not displayed. CBG:  Recent Labs Lab 10/11/13 2112 10/12/13 0445 10/12/13 0756 10/12/13 1318 10/12/13 1629  GLUCAP 433* 256* 304* 245* 233*   D-Dimer No results found for this basename: DDIMER,  in the last 72 hours Hgb A1c No results found for this basename: HGBA1C,  in the last 72 hours Microbiology Recent Results (from the past 240 hour(s))  CULTURE, BLOOD (ROUTINE X 2)     Status: None   Collection Time    10/06/13  6:58 AM      Result Value Ref Range Status   Specimen Description BLOOD   Final   Special Requests BOTTLES DRAWN AEROBIC AND ANAEROBIC LEFT AC 5 CC   Final   Culture  Setup Time     Final   Value: 10/06/2013 10:33     Performed at [facility]   Culture     Final   Value: KLEBSIELLA PNEUMONIAE     Note: Gram Stain Report Called to,Read Back By and Verified With: BECKY DAVIS ON 10/06/2013 AT 8:12P BY WILEJ     Performed at [facility]   Report Status 10/08/2013 FINAL   Final   Organism ID, Bacteria KLEBSIELLA PNEUMONIAE   Final  URINE CULTURE     Status: None   Collection Time    10/06/13  7:55 AM      Result Value Ref Range Status   Specimen  Description URINE, RANDOM   Final   Special Requests NONE   Final   Culture  Setup Time     Final   Value: 10/06/2013 10:41     Performed at [place]     Final   Value: >=100,000 COLONIES/ML     Performed at [facility]   Culture     Final   Value: KLEBSIELLA PNEUMONIAE     Performed at [facility]   Report Status 10/08/2013 FINAL   Final   Organism ID, Bacteria KLEBSIELLA PNEUMONIAE   Final  CULTURE, BLOOD (ROUTINE X 2)     Status: None   Collection Time    10/06/13  9:05 AM      Result Value Ref Range Status   Specimen Description BLOOD   Final   Special Requests BOTTLES DRAWN AEROBIC AND ANAEROBIC PORT 5 CC   Final   Culture  Setup Time     Final   Value: 10/06/2013 12:38     Performed at [facility]   Culture     Final   Value: KLEBSIELLA PNEUMONIAE     Note: SUSCEPTIBILITIES PERFORMED ON PREVIOUS CULTURE WITHIN THE LAST 5 DAYS.     Note: Gram Stain Report Called to,Read Back By and Verified With: BECKY DAVIS ON 10/06/2013 AT 8:12P BY Dennard Nip     Performed at [facility]   Report Status 10/08/2013 FINAL   Final  MRSA PCR SCREENING     Status: None   Collection Time    10/06/13 11:08 AM      Result Value Ref Range Status   MRSA by PCR NEGATIVE  NEGATIVE Final   Comment:            The GeneXpert MRSA Assay (FDA     approved for NASAL specimens     only), is one component of a     comprehensive MRSA colonization     surveillance program. It is not     intended to diagnose MRSA     infection nor to guide  or     monitor treatment for     MRSA infections.   Studies:  Dg Abd 1 View  10/11/2013   CLINICAL DATA:  Stent exchange  EXAM: DG C-ARM 1-60 MIN - NRPT [facility]; ABDOMEN - 1 VIEW  COMPARISON:  10/06/2013  FINDINGS: Three spot fluoro graphic images were obtained during right ureteral stent exchange. The gray intrarenal collecting system is opacified mildly dilated. The ureteral stent is seen extending from the pelvis  to the bladder. Surgical clips are upper quadrant reflect prior cholecystectomy. Soft tissues are otherwise unremarkable.  IMPRESSION: Portable fluoroscopic images obtained during right ureteral stent replacement. Please refer to the procedure report for a complete description.   Electronically Signed   By: Lajean Manes M.D.   On: 10/11/2013 17:05   Dg C-arm 1-60 Min-no Report  10/11/2013   CLINICAL DATA:  Stent exchange  EXAM: DG C-ARM 1-60 MIN - NRPT [facility]; ABDOMEN - 1 VIEW  COMPARISON:  10/06/2013  FINDINGS: Three spot fluoro graphic images were obtained during right ureteral stent exchange. The gray intrarenal collecting system is opacified mildly dilated. The ureteral stent is seen extending from the pelvis to the bladder. Surgical clips are upper quadrant reflect prior cholecystectomy. Soft tissues are otherwise unremarkable.  IMPRESSION: Portable fluoroscopic images obtained during right ureteral stent replacement. Please refer to the procedure report for a complete description.   Electronically Signed   By: Lajean Manes M.D.   On: 10/11/2013 17:05    Assessment/Plan: 73 y.o.  1. AMS secondary to #2, #3, #4, resolving.  --  On ceftriaxone. Blood, urine cultures with Kleb Pneu.  Afebrile.    2. Acute of Chronic Renal Failure.  --  Agree probable ATN secondary dehydration plus sepsis. Creatinine 2.34. S/p stent exchange.  3. Urosepsis w tachycardia and hypotension.  --Given stress doses of hydrocortisone. Off pressors.   4. 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  --HOLD further chemotherapy in the setting of above.  --Patient does not want intubation or chest compressions per prior discussion.  --Prognosis overall is poor given this is her 4th line therapy with response rates less that 30%.  We will likely start the palliative care discussion once acute urosepsis is  resolved.   5. S/P R. Hydronephrosis, s/p R ureteral stent (04/03)  --Patient had R ureteral stent placed on 04/03. Exchanged on 10/11/2013.   6. Anemia, Thrombocytopenia.  -- Likely secondary to sepsis with DIC.  Plts, 54. No signs of bleeding.    Transfuse for active bleeding or plts less than 10. Will receive one bag of plts per urology for procedure.   Other causes to consider would be lymphoma with bone marrow involvement versus chemotherapy-induced.  --I would hold A/C when plts are less than 50 as her bleeding risk would be high.    7. Hypothyroidism: Levothyroxine.  8. Hypertension: Anti-hypertensives held due to hypotension.  9. Diabetes mellitus, type II: Worst with steroids.  Hoping to taper off.  HBA1c 8.5. 10. Hyperlipidemia: Pravastatin per PCP.  11.History of atrial fibrillation: Diltiazem, pacemaker. Coumadin being managed per her PCP previously. HOLD coumadin due to decrease in plts.  12. COPD/ Chronic Bronchitis exacerbation.  --Patient advised to continue albuterol q 6 hours as needed.  13. Poor nutrition, hypoalbuminemia. Nutrition as tolerated with ensure/boost.  14. Disposition. Partial.   Thanks for taking care of Ms. [place].    Tanith Dagostino, MD  10/12/2013  5:07 PM

## 2013-10-12 NOTE — Progress Notes (Signed)
CARE MANAGEMENT NOTE 10/12/2013  Patient:  [name], [name]   Account Number:  [id number]  Date Initiated:  10/06/2013  Documentation initiated by:  DAVIS,RHONDA  Subjective/Objective Assessment:   urinary sepsis, hx of lymphoma,temp 100.4/hypotensive -iv f;d bolus.     Action/Plan:   assited living at [facility]   Anticipated DC Date:  10/15/2013   Anticipated DC Plan:  [place]  In-house referral  Clinical Social Worker      DC Planning Services  NA      [facility] Choice  NA   Choice offered to / List presented to:  NA   DME arranged  NA      DME agency  NA     [place] arranged  NA      [place] agency  NA   Status of service:  In process, will continue to follow Medicare Important Message given?  NA - LOS <3 / Initial given by admissions (If response is "NO", the following Medicare IM given date fields will be blank) Date Medicare IM given:   Medicare IM given by:   Date Additional Medicare IM given:   Additional Medicare IM given by:    Discharge Disposition:    Per UR Regulation:  Reviewed for med. necessity/level of care/duration of stay  If discussed at [place] of Stay Meetings, dates discussed:    Comments:  [id number]/Rhonda Eldridge Dace, [place], [place] [phone] Chart Reviewed for discharge and hospital needs. patient became hypotensive and in a.fib resulting in iv neo drip until [id number] early am, remains on iv cardizem drip/ Discharge needs at time of review: None present will follow for needs. Review of patient progress due on [id number].   [id number]/Rhonda Davis,RN,BSn,CCM: Patient with uro and systemic sepsis.  Iv vasopressors, urinary stents to be replaced/urine and blood cultures + klebsielle [place]/remains in critical condition/  [place], [place], [place], [place] [phone] Chart Reviewed for discharge and hospital needs. Discharge needs at time of review: None present will follow for needs. IM UPDATE NEEDED ON [id number] IF REMAINS  INPATIENT Review of patient progress due on [id number].

## 2013-10-12 NOTE — Op Note (Signed)
[name], [name]               ACCOUNT NO.:  [id number]  MEDICAL RECORD NO.:  [id number]  LOCATION:  83                         FACILITY:  [facility]  PHYSICIAN:  Alexis Frock, MD     DATE OF BIRTH:  1940/07/24  DATE OF PROCEDURE: 10/11/2013 DATE OF DISCHARGE:                              OPERATIVE REPORT   DIAGNOSES:  Right malignant ureteral obstruction, history of urinary tract infections and urosepsis.  PROCEDURE: 1. Cystoscopy with right retrograde pyelogram interpretation 2. Exchange of right ureteral stent 6 x 24, no tether.  ESTIMATED BLOOD LOSS:  Nil.  COMPLICATIONS:  None.  DRAINS:  A 16-French Foley catheter to straight drain.  ANESTHESIA:  Monitored anesthesia care.  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  This was later exchanged for an internalized stent and she has been planned by primary urologist, Dr. Junious Silk to undergo exchange of right ureteral stent, however, she became acutely ill and has had ICU admission.  She is now progressing well from this acute illness and felt that stent exchange is still warranted.  Dr. Junious Silk is out of town and in his absence, my colleague Dr. Risa Grill and I have agreed to take care of the patient.  She underwent initial attempt at right stent change earlier this morning; however, she was found to be very tachycardic in atrial fibrillation with rapid ventricular response. She was returned back to the ICU to be medically optimized which promptly occurred and she now presents for re-attempt.  Notably, the patient also has thrombocytopenia and received platelets perioperatively.  Informed consent was obtained and placed in medical record.  DESCRIPTION OF PROCEDURE:  The patient being [name] and procedure being exchange of right  ureteral stent was confirmed.  Procedure was carried out.  Time-out was performed.  Intravenous antibiotics were administered.  Monitored anesthesia care.  Conscious sedation was introduced.  The patient was carefully placed into a low lithotomy position.  Sterile field was created by prepping and draping the patient's vagina, introitus, and proximal thighs using iodine x3.  After her in situ Foley catheter was removed.  Next, cystourethroscopy was performed using 22-French rigid cystoscope with 12-degree offset lens. Inspection of bladder revealed some mild infectious looking debris. This was easily irrigated per scope.  There was a stent exiting the right ureteral orifice.  Distal curl was grasped, brought to the level of the urethral meatus through which a 0.038 Sensor wire was advanced to the level of the upper pole.  The stent was exchanged for a 6-French Foley catheter, right retrograde pyelogram was obtained.  Right retrograde pyelogram, this a single right ureter, single system right kidney.  There was no hydronephrosis.  There was significant tortuosity and abnormality at the proximal ureter consistentwith known extrinsic malignant compression.  The Sensor wire was once again advanced to the level of the upper pole and the open- ended catheter was exchanged for a new 6 x 24 contour type stent.  Good proximal curl was noted in the upper pole.  The distal curl noted within  the urinary bladder.  A new Foley catheter was placed per urethra to straight drain 10 mL sterile water in the balloon, this was 16-French. Procedure was then terminated and the patient tolerated the procedure well.  There were no immediate periprocedural complications.  The patient was then transferred back to the intensive care unit.          ______________________________ Alexis Frock, MD     TM/MEDQ  D:  10/11/2013  T:  10/11/2013  Job:  [id number]

## 2013-10-13 DIAGNOSIS — R6521 Severe sepsis with septic shock: Secondary | ICD-10-CM

## 2013-10-13 DIAGNOSIS — A419 Sepsis, unspecified organism: Secondary | ICD-10-CM | POA: Diagnosis present

## 2013-10-13 LAB — GLUCOSE, CAPILLARY
GLUCOSE-CAPILLARY: 239 mg/dL — AB (ref 70–99)
GLUCOSE-CAPILLARY: 259 mg/dL — AB (ref 70–99)
Glucose-Capillary: 237 mg/dL — ABNORMAL HIGH (ref 70–99)
Glucose-Capillary: 311 mg/dL — ABNORMAL HIGH (ref 70–99)
Glucose-Capillary: 352 mg/dL — ABNORMAL HIGH (ref 70–99)

## 2013-10-13 LAB — BASIC METABOLIC PANEL
Anion gap: 16 — ABNORMAL HIGH (ref 5–15)
BUN: 65 mg/dL — AB (ref 6–23)
CALCIUM: 6.6 mg/dL — AB (ref 8.4–10.5)
CO2: 24 mEq/L (ref 19–32)
Chloride: 101 mEq/L (ref 96–112)
Creatinine, Ser: 2.12 mg/dL — ABNORMAL HIGH (ref 0.50–1.10)
GFR calc Af Amer: 26 mL/min — ABNORMAL LOW (ref >90)
GFR calc non Af Amer: 22 mL/min — ABNORMAL LOW (ref >90)
Glucose, Bld: 240 mg/dL — ABNORMAL HIGH (ref 70–99)
Potassium: 3.2 mEq/L — ABNORMAL LOW (ref 3.7–5.3)
SODIUM: 141 meq/L (ref 137–147)

## 2013-10-13 LAB — PROTIME-INR
INR: 1.2 (ref 0.00–1.49)
Prothrombin Time: 15.2 seconds (ref 11.6–15.2)

## 2013-10-13 MED ORDER — POTASSIUM CHLORIDE CRYS ER 20 MEQ PO TBCR
40.0000 meq | EXTENDED_RELEASE_TABLET | Freq: Once | ORAL | Status: AC
  Administered 2013-10-13: 40 meq via ORAL
  Filled 2013-10-13: qty 2

## 2013-10-13 MED ORDER — INSULIN GLARGINE 100 UNIT/ML ~~LOC~~ SOLN
24.0000 [IU] | Freq: Every day | SUBCUTANEOUS | Status: DC
  Administered 2013-10-14 – 2013-10-18 (×5): 24 [IU] via SUBCUTANEOUS
  Filled 2013-10-13 (×5): qty 0.24

## 2013-10-13 NOTE — Progress Notes (Signed)
PROGRESS NOTE  [name] GQQ:[id number] DOB: [birth year] DOA: 10/06/2013 PCP: [name], MD  HPI: 73 year old female with past medical history of diffuse B-cell lymphoma (under the care of Dr. [name]), on chemotherapy with Revlimid and Rituxan, history of atrial fibrillation on Coumadin, sick sinus syndrome, status post pacemaker placement, recent admission in May 2015 for acute on chronic kidney disease and right hydronephrosis who presented from skilled nursing facility with reports of altered mental status, lethargy for the past 24 hours prior to this admission. In addition, patient was found to have a low grade fever. Patient is not a good historian due to altered mental status but her family at the bedside is able to provide some details of history of present illness. Patient's family also reported that her blood sugars were and 500 range in skilled nursing facility. Family spoke to the patient over the phone 2 days prior to this admission and the patient reported not feeling quite well. No respiratory distress. No vomiting. In ED, blood pressure was 90/53, heart rate 124, RR 16-23, T max 100.4 F and oxygen saturation 96% on room air. Chest x-ray did not show acute cardiopulmonary process. Urinalysis showed moderate leukocytes and many bacteria. Lactic acid was 1.83. She was started on empiric Rocephin for treatment of UTI. Additional blood work included BNP which was elevated at 8619. Creatinine was 2.43, sodium 129, hemoglobin 10.4. Patient's family at the bedside confirmed partial CODE STATUS. Patient admitted to step down unit for further management. ED medications: Bolus NS 500 cc and then maintenance NS @ 100 cc/hr; Rocephin and vancomycin; Tylenol  Assessment/Plan:  Septic shock - in the setting of urinary tract infection - off pressors since 7/1, stable blood pressure - continue Ceftriaxone  - continue steroids Relapsed, Refractory high-grade Non-Hodgkin's B-Cell  Lymphoma - chemotherapy on hold, may need palliative care consult soon. She had 4th line therapy. Dr. [name] following.  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 [name] due to thrombocytopenia.  SSS s/p pacemaker COPD - stable, no wheezing  - on room air DM - adjust her Lantus by 20% today - continue SSI. Hypothyroidism - continue Synthroid Acute encephalopathy - due to multiple active problems, stable   SIGNIFICANT EVENTS:  10/2010 - Dx with NHL  04/2011 - Chemo R CHOP + XRT with good response  10/2012 - Recurrence, Rx with salvage chemo  05/2013 - Recurrence, CT A/P >> increased LAN with severe R hydro  06/2013 - Admission for sepsis, pyelo  07/14/13 - R ureteral stent placement  5/20-5/28 - Admission for Enterococcus UTI / Sepsis. AKI/CKD- multi-factorial with R-sided hydro, nephrotoxic agents, urosepsis, hypercalcemia, and volume depletion related to diarrhea as well as nephrotoxic agents. Improved with IVF's. Creatinine as high as 7.6 down to 2.58 on d/c. She underwent renal ultrasound which showed right stent in place, no hydronephrosis on the right or the left.  ......................................................................................................................................................  6/26 - Readmit with c/o fever, increased swelling, periods of hypoglycemia. UTI +, Sepsis  6/29 - Resolved sepsis, off pressors. Significant thrombocytopenia  7/1 - ureteral stent change, transiently needed pressors   STUDIES:  6/26 renal US >> no hydronephrosis, stent shadow  LINES / TUBES:  R CW Port >>>  CULTURES: Hx Enterococcus UTI 5/20>>  sens Vanco, nitrofurantioin  BCx2 6/26  >>klebsiella>>sens zosyn  UC 6/26 >>klebsiella>>sens zosyn  ANTIBIOTICS:  Rocephin 6/26 >>>x1  Zosyn 6/26 >>>6/29  Vanco 6/26 >>> 6/28  Rocephin 6/29 >>  Diet: NPO Fluids: none DVT Prophylaxis: SCD  Code Status: Partial Family Communication: none this morning  Disposition Plan: remain hospitalized.  Consultants:  PCCM  Urology  Oncology  HPI/Subjective: Feeling well, no complaints, confused  Objective: Filed Vitals:   10/13/13 0339 10/13/13 0400 10/13/13 0500 10/13/13 0600  BP:  131/57  120/51  Pulse:  70  70  Temp: 97.5 F (36.4 C)     TempSrc:      Resp:  13  12  Height:      Weight:   100.562 kg (221 lb 11.2 oz)   SpO2:  96%  96%    Intake/Output Summary (Last 24 hours) at 10/13/13 0716 Last data filed at 10/13/13 0600  Gross per 24 hour  Intake    450 ml  Output   3925 ml  Net  -3475 ml   Filed Weights   10/12/13 0400 10/12/13 0500 10/13/13 0500  Weight: 99.746 kg (219 lb 14.4 oz) 99.746 kg (219 lb 14.4 oz) 100.562 kg (221 lb 11.2 oz)   Exam:  General:  NAD  Cardiovascular: regular rate and rhythm, without MRG  Respiratory: good air movement, clear to auscultation throughout, no wheezing, ronchi or rales  Abdomen: soft, not tender to palpation, positive bowel sounds  MSK: no peripheral edema  Neuro: non focal  Data Reviewed: Basic Metabolic Panel:  Recent Labs Lab 10/06/13 1017  10/08/13 0600 10/09/13 0400 10/11/13 0415 10/12/13 0445 10/13/13 0440  NA 132*  < > 132* 133* 140 133* 141  K 4.7  < > 4.2 3.9 2.6* 3.6* 3.2*  CL 91*  < > 92* 92* 104 95* 101  CO2 23  < > 20 22 20 23 24   GLUCOSE 341*  < > 411* 319* 164* 246* 240*  BUN 71*  < > 79* 85* 74* 73* 65*  CREATININE 2.56*  < > 3.06* 3.04* 2.24* 2.34* 2.12*  CALCIUM 9.0  < > 7.5* 7.0* 5.8* 6.6* 6.6*  MG 2.0  --   --   --   --   --   --   PHOS 4.6  --   --   --   --   --   --   < > = values in this interval not displayed. Liver Function Tests:  Recent Labs Lab 10/06/13 1017  10/07/13 0425 10/09/13 0400  AST 16 72* 52*  ALT 36* 81* 92*  ALKPHOS 109 150* 107  BILITOT 0.2* 0.5 0.2*  PROT 5.5* 5.5* 4.8*  ALBUMIN 2.0* 1.8* 1.4*   CBC:  Recent Labs Lab 10/06/13 1017  10/08/13 0600 10/09/13 0400 10/11/13 0130 10/11/13 0415 10/12/13 0445  WBC 6.2  < > 6.2 4.0 4.9 4.3 4.3  NEUTROABS 5.8  --   --   --   --   --  3.8  HGB 9.7*  < > 8.6* 7.7* 8.1* 7.6* 7.8*  HCT 29.1*  < > 25.7* 22.6* 24.2* 22.5* 23.4*  MCV 88.7  < > 86.8 85.9 86.7 87.2 89.0  PLT 24*  < > 13* 14* 76* 65* 54*  < > = values in this interval not displayed. Cardiac Enzymes:  Recent Labs Lab 10/06/13 1447 10/06/13 2200 10/07/13 0425  TROPONINI <0.30 <0.30 <0.30   BNP (last 3 results)  Recent Labs  10/06/13 2671  PROBNP 8619.0*   CBG:  Recent Labs Lab 10/12/13 0445 10/12/13 0756 10/12/13 1318 10/12/13 1629 10/12/13 2202  GLUCAP 256* 304* 245* 233* 263*    Recent Results (from the past 240 hour(s))  CULTURE, BLOOD (ROUTINE X 2)     Status: None   Collection Time    10/06/13  6:58 AM      Result Value Ref Range Status   Specimen Description BLOOD   Final   Special Requests BOTTLES DRAWN AEROBIC AND ANAEROBIC LEFT AC 5 CC   Final   Culture  Setup Time     Final   Value: 10/06/2013 10:33     Performed at [facility]   Culture     Final   Value: KLEBSIELLA PNEUMONIAE     Note: Gram Stain Report Called to,Read Back By and Verified With: BECKY DAVIS ON 10/06/2013 AT 8:12P BY WILEJ     Performed at [facility]   Report Status 10/08/2013 FINAL   Final   Organism ID, Bacteria KLEBSIELLA PNEUMONIAE   Final  URINE CULTURE     Status: None   Collection Time    10/06/13  7:55 AM      Result Value Ref Range Status   Specimen Description URINE, RANDOM   Final   Special Requests NONE   Final   Culture  Setup Time     Final   Value: 10/06/2013 10:41     Performed at [place]     Final   Value: >=100,000 COLONIES/ML     Performed at  [facility]   Culture     Final   Value: KLEBSIELLA PNEUMONIAE     Performed at [facility]   Report Status 10/08/2013 FINAL   Final   Organism ID, Bacteria KLEBSIELLA PNEUMONIAE   Final  CULTURE, BLOOD (ROUTINE X 2)     Status: None   Collection Time    10/06/13  9:05 AM      Result Value Ref Range Status   Specimen Description BLOOD   Final   Special Requests BOTTLES DRAWN AEROBIC AND ANAEROBIC PORT 5 CC   Final   Culture  Setup Time     Final   Value: 10/06/2013 12:38     Performed at [facility]   Culture     Final   Value: KLEBSIELLA PNEUMONIAE     Note: SUSCEPTIBILITIES PERFORMED ON PREVIOUS CULTURE WITHIN THE LAST 5 DAYS.     Note: Gram Stain Report Called to,Read Back By and Verified With: BECKY DAVIS ON 10/06/2013 AT 8:12P BY Dennard Nip     Performed at [facility]   Report Status 10/08/2013 FINAL   Final  MRSA PCR SCREENING     Status: None   Collection Time    10/06/13 11:08 AM      Result Value Ref Range Status   MRSA by PCR NEGATIVE  NEGATIVE Final   Comment:            The GeneXpert MRSA Assay (FDA     approved for NASAL specimens     only), is one component of a     comprehensive MRSA colonization     surveillance program. It is not     intended to diagnose MRSA     infection nor to guide or     monitor treatment for     MRSA infections.     Studies: Dg Abd 1 View  10/11/2013   CLINICAL DATA:  Stent exchange  EXAM: DG C-ARM 1-60 MIN - NRPT [facility]; ABDOMEN - 1 VIEW  COMPARISON:  10/06/2013  FINDINGS: Three spot fluoro graphic images were obtained during right ureteral stent exchange. The gray intrarenal collecting system is opacified mildly dilated. The ureteral stent is seen extending from the pelvis to the bladder. Surgical clips are upper quadrant reflect prior cholecystectomy. Soft tissues are otherwise unremarkable.  IMPRESSION: Portable fluoroscopic images obtained during right ureteral stent replacement. Please refer to the  procedure report for a complete description.   Electronically Signed   By: Lajean Manes M.D.   On: 10/11/2013 17:05   Dg C-arm 1-60 Min-no Report  10/11/2013   CLINICAL DATA:  Stent exchange  EXAM: DG C-ARM 1-60 MIN - NRPT [facility]; ABDOMEN - 1 VIEW  COMPARISON:  10/06/2013  FINDINGS: Three spot fluoro graphic images were obtained during right ureteral stent exchange. The gray intrarenal collecting system is opacified mildly dilated. The ureteral stent is seen extending from the pelvis to the bladder. Surgical clips are upper quadrant reflect prior cholecystectomy. Soft tissues are otherwise unremarkable.  IMPRESSION: Portable fluoroscopic images obtained during right ureteral stent replacement. Please refer to the procedure report for a complete description.   Electronically Signed   By: Lajean Manes M.D.   On: 10/11/2013 17:05    Scheduled Meds: . bisacodyl  5 mg Oral QHS  . cefTRIAXone (ROCEPHIN)  IV  1 g Intravenous Q24H  . diltiazem  180 mg Oral Daily  . hydrocortisone sodium succinate  50 mg Intravenous Q12H  . insulin aspart  0-20 Units Subcutaneous TID WC  . insulin aspart  0-5 Units Subcutaneous QHS  . insulin glargine  20 Units Subcutaneous Daily  . levothyroxine  50 mcg Oral QAC breakfast  . magnesium oxide  200 mg Oral Daily  . metoprolol  2.5 mg Intravenous 4 times per day  . sodium chloride  3 mL Intravenous Q12H  . tiotropium  18 mcg Inhalation Daily   Continuous Infusions: . lactated ringers    . phenylephrine (NEO-SYNEPHRINE) Adult infusion Stopped (10/12/13 0200)   Active Problems:   Cardiac pacemaker in situ   Diffuse large B cell lymphoma   Hypothyroidism   T2 NIDDM   Non-Hodgkin's lymphoma of abdomen   Antineoplastic chemotherapy induced pancytopenia   Acute renal failure   Anemia of chronic disease   COPD (chronic obstructive pulmonary disease)   Atrial fibrillation with RVR   Hydronephrosis, right   UTI (lower urinary tract infection)   CKD (chronic kidney  disease) stage 3, GFR 30-59 ml/min   Acute encephalopathy   Sepsis   Severe sepsis with septic shock  Time spent: 35  This note has been created with Surveyor, quantity. Any transcriptional errors are unintentional.   Marzetta Board, MD [facility] Pager [phone]. If 7 PM - 7 AM, please contact night-coverage at [email or web address], password [facility] 10/13/2013, 7:17 AM  LOS: 7 days

## 2013-10-14 LAB — COMPREHENSIVE METABOLIC PANEL
ALBUMIN: 2.1 g/dL — AB (ref 3.5–5.2)
ALT: 12 U/L (ref 0–35)
ALT: 33 U/L (ref 0–35)
AST: 19 U/L (ref 0–37)
AST: 11 U/L (ref 0–37)
Albumin: 1.7 g/dL — ABNORMAL LOW (ref 3.5–5.2)
Alkaline Phosphatase: 57 U/L (ref 39–117)
Alkaline Phosphatase: 82 U/L (ref 39–117)
Anion gap: 14 (ref 5–15)
BILIRUBIN TOTAL: 0.2 mg/dL — AB (ref 0.3–1.2)
BUN: 57 mg/dL — AB (ref 6–23)
CO2: 28 mEq/L (ref 19–32)
Calcium: 6.9 mg/dL — ABNORMAL LOW (ref 8.4–10.5)
Chloride: 101 mEq/L (ref 96–112)
Creatinine, Ser: 1.75 mg/dL — ABNORMAL HIGH (ref 0.50–1.10)
GFR calc non Af Amer: 28 mL/min — ABNORMAL LOW (ref >90)
GFR, EST AFRICAN AMERICAN: 32 mL/min — AB (ref >90)
Glucose, Bld: 199 mg/dL — ABNORMAL HIGH (ref 70–99)
Potassium: 3 mEq/L — ABNORMAL LOW (ref 3.7–5.3)
Sodium: 143 mEq/L (ref 137–147)
TOTAL PROTEIN: 6 g/dL (ref 6.0–8.3)
Total Bilirubin: <0.2 mg/dL — ABNORMAL LOW (ref 0.3–1.2)
Total Protein: 5.2 g/dL — ABNORMAL LOW (ref 6.0–8.3)

## 2013-10-14 LAB — GLUCOSE, CAPILLARY
Glucose-Capillary: 243 mg/dL — ABNORMAL HIGH (ref 70–99)
Glucose-Capillary: 197 mg/dL — ABNORMAL HIGH (ref 70–99)
Glucose-Capillary: 328 mg/dL — ABNORMAL HIGH (ref 70–99)
Glucose-Capillary: 298 mg/dL — ABNORMAL HIGH (ref 70–99)
Glucose-Capillary: 209 mg/dL — ABNORMAL HIGH (ref 70–99)

## 2013-10-14 LAB — CBC WITH DIFFERENTIAL/PLATELET
BASOS ABS: 0 10*3/uL (ref 0.0–0.1)
Basophils Relative: 0 % (ref 0–1)
EOS ABS: 0 10*3/uL (ref 0.0–0.7)
Eosinophils Relative: 0 % (ref 0–5)
HEMATOCRIT: 25.4 % — AB (ref 36.0–46.0)
Hemoglobin: 8.3 g/dL — ABNORMAL LOW (ref 12.0–15.0)
Lymphocytes Relative: 8 % — ABNORMAL LOW (ref 12–46)
Lymphs Abs: 0.4 10*3/uL — ABNORMAL LOW (ref 0.7–4.0)
MCH: 29.1 pg (ref 26.0–34.0)
MCHC: 32.7 g/dL (ref 30.0–36.0)
MCV: 89.1 fL (ref 78.0–100.0)
MONOS PCT: 4 % (ref 3–12)
Monocytes Absolute: 0.2 10*3/uL (ref 0.1–1.0)
NEUTROS ABS: 4.9 10*3/uL (ref 1.7–7.7)
Neutrophils Relative %: 88 % — ABNORMAL HIGH (ref 43–77)
Platelets: 46 10*3/uL — ABNORMAL LOW (ref 150–400)
RBC: 2.85 MIL/uL — ABNORMAL LOW (ref 3.87–5.11)
RDW: 20.2 % — AB (ref 11.5–15.5)
WBC: 5.5 10*3/uL (ref 4.0–10.5)

## 2013-10-14 LAB — PROTIME-INR
INR: 2.08 — AB (ref 0.00–1.49)
INR: 1.13 (ref 0.00–1.49)
PROTHROMBIN TIME: 14.5 s (ref 11.6–15.2)
Prothrombin Time: 23.4 seconds — ABNORMAL HIGH (ref 11.6–15.2)

## 2013-10-14 MED ORDER — POTASSIUM CHLORIDE CRYS ER 20 MEQ PO TBCR
40.0000 meq | EXTENDED_RELEASE_TABLET | ORAL | Status: AC
  Administered 2013-10-14 (×2): 40 meq via ORAL
  Filled 2013-10-14 (×2): qty 2

## 2013-10-14 MED ORDER — POTASSIUM CHLORIDE 20 MEQ/15ML (10%) PO LIQD
40.0000 meq | ORAL | Status: DC
  Administered 2013-10-14: 40 meq via ORAL
  Filled 2013-10-14: qty 30

## 2013-10-14 MED ORDER — LIDOCAINE-PRILOCAINE 2.5-2.5 % EX CREA
TOPICAL_CREAM | Freq: Once | CUTANEOUS | Status: DC
Start: 2013-10-14 — End: 2013-10-14
  Filled 2013-10-14: qty 5

## 2013-10-14 MED ORDER — DILTIAZEM HCL ER COATED BEADS 180 MG PO CP24
180.0000 mg | ORAL_CAPSULE | Freq: Every day | ORAL | Status: DC
  Administered 2013-10-14 – 2013-10-18 (×5): 180 mg via ORAL
  Filled 2013-10-14 (×5): qty 1

## 2013-10-14 MED ORDER — SODIUM CHLORIDE 0.9 % IV BOLUS (SEPSIS)
500.0000 mL | Freq: Once | INTRAVENOUS | Status: AC
  Administered 2013-10-14: 500 mL via INTRAVENOUS

## 2013-10-14 NOTE — Progress Notes (Signed)
IV Team Note;  Pt has portacath;  Huber needle is changed every 7 days per INS standards;  Huber needle removed, and then pt said she would not allow the port to be reaccessed unless it "is numb";  Explained to pt that the MD will have to order medicine to "numb;" pt then asked for ice to be used;  Had to come back to see pt a second time after she agreed to have port reaccessed;  Ice was used, then skin prepped per policy;  [employer] easily; blood return noted;  Iv fluids resumed;  Appreciate input and help from Dr Junious Silk .  Raynelle Fanning RN  IV Team

## 2013-10-14 NOTE — Progress Notes (Signed)
PROGRESS NOTE  [name] ZOX:[id number] DOB: 20-Mar-1941 DOA: 10/06/2013 PCP: [name], MD  HPI: 73 year old female with past medical history of diffuse B-cell lymphoma (under the care of Dr. [name]), on chemotherapy with Revlimid and Rituxan, history of atrial fibrillation on Coumadin, sick sinus syndrome, status post pacemaker placement, recent admission in May 2015 for acute on chronic kidney disease and right hydronephrosis who presented from skilled nursing facility with reports of altered mental status, lethargy for the past 24 hours prior to this admission. In addition, patient was found to have a low grade fever. Patient is not a good historian due to altered mental status but her family at the bedside is able to provide some details of history of present illness. Patient's family also reported that her blood sugars were and 500 range in skilled nursing facility. Family spoke to the patient over the phone 2 days prior to this admission and the patient reported not feeling quite well. No respiratory distress. No vomiting. In ED, blood pressure was 90/53, heart rate 124, RR 16-23, T max 100.4 F and oxygen saturation 96% on room air. Chest x-ray did not show acute cardiopulmonary process. Urinalysis showed moderate leukocytes and many bacteria. Lactic acid was 1.83. She was started on empiric Rocephin for treatment of UTI. Additional blood work included BNP which was elevated at 8619. Creatinine was 2.43, sodium 129, hemoglobin 10.4. Patient's family at the bedside confirmed partial CODE STATUS. Patient admitted to step down unit for further management. ED medications: Bolus NS 500 cc and then maintenance NS @ 100 cc/hr; Rocephin and vancomycin; Tylenol  Assessment/Plan:  Septic shock - in the setting of urinary tract infection - off pressors since 7/1 - continue Ceftriaxone  - continue steroids Relapsed, Refractory high-grade Non-Hodgkin's B-Cell Lymphoma - chemotherapy on  hold, may need palliative care consult soon. She had 4th line therapy. Dr. [name] following.  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 Hypokalemia - replete as needed Hypocalcemia Anemia/Thrombocytopenia  R hydronephrosis s/p R ureteral stent placement 4/3 exchanged 7/2 - appreciate urology input, good UOP, renal function pending this morning A fib with RVR - continue Diltiazem - hold Coumadin per Dr. [name] due to thrombocytopenia.  SSS s/p pacemaker COPD - stable, no wheezing  - on room air DM - adjust her regimen as needed - continue SSI. Hypothyroidism - continue Synthroid Acute encephalopathy - due to multiple active problems, stable   SIGNIFICANT EVENTS:  10/2010 - Dx with NHL  04/2011 - Chemo R CHOP + XRT with good response  10/2012 - Recurrence, Rx with salvage chemo  05/2013 - Recurrence, CT A/P >> increased LAN with severe R hydro  06/2013 - Admission for sepsis, pyelo  07/14/13 - R ureteral stent placement  5/20-5/28 - Admission for Enterococcus UTI / Sepsis. AKI/CKD- multi-factorial with R-sided hydro, nephrotoxic agents, urosepsis, hypercalcemia, and volume depletion related to diarrhea as well as nephrotoxic agents. Improved with IVF's. Creatinine as high as 7.6 down to 2.58 on d/c. She underwent renal ultrasound which showed right stent in place, no hydronephrosis on the right or the left.  ......................................................................................................................................................  6/26 - Readmit with c/o fever, increased swelling, periods of hypoglycemia. UTI +, Sepsis  6/29 - Resolved sepsis, off pressors. Significant thrombocytopenia  7/1 - ureteral stent change, transiently needed pressors   STUDIES:  6/26 renal US >> no  hydronephrosis, stent shadow  LINES / TUBES:  R CW Port >>>  CULTURES: Hx Enterococcus UTI 5/20>> sens Vanco, nitrofurantioin  BCx2 6/26 >>klebsiella>>sens zosyn  UC 6/26 >>klebsiella>>sens zosyn  ANTIBIOTICS:  Rocephin 6/26 >>>x1  Zosyn 6/26 >>>6/29  Vanco 6/26 >>> 6/28  Rocephin 6/29 >>  Diet: NPO Fluids: none DVT Prophylaxis: SCD  Code Status: Partial Family Communication: none this morning  Disposition Plan: remain hospitalized.  Consultants:  PCCM  Urology  Oncology  HPI/Subjective: Feeling well, no complaints, confused  Objective: Filed Vitals:   10/14/13 0200 10/14/13 0400 10/14/13 0500 10/14/13 0600  BP: 104/54 102/52  83/55  Pulse: 89 112  80  Temp:      TempSrc:      Resp: 12 10  12   Height:      Weight:   97.206 kg (214 lb 4.8 oz)   SpO2: 96% 96%  96%    Intake/Output Summary (Last 24 hours) at 10/14/13 0734 Last data filed at 10/14/13 0600  Gross per 24 hour  Intake    290 ml  Output   3600 ml  Net  -3310 ml   Filed Weights   10/12/13 0500 10/13/13 0500 10/14/13 0500  Weight: 99.746 kg (219 lb 14.4 oz) 100.562 kg (221 lb 11.2 oz) 97.206 kg (214 lb 4.8 oz)   Exam:  General:  NAD  Cardiovascular: regular rate and rhythm, without MRG  Respiratory: good air movement, clear to auscultation throughout, no wheezing, ronchi or rales  Abdomen: soft, not tender to palpation, positive bowel sounds  MSK: no peripheral edema  Neuro: non focal  Data Reviewed: Basic Metabolic Panel:  Recent Labs Lab 10/09/13 0400 10/11/13 0415 10/12/13 0445 10/13/13 0440 10/14/13 0545  NA 133* 140 133* 141 QUESTIONABLE RESULTS, RECOMMEND RECOLLECT TO VERIFY  K 3.9 2.6* 3.6* 3.2* QUESTIONABLE RESULTS, RECOMMEND RECOLLECT TO VERIFY  CL 92* 104 95* 101 QUESTIONABLE RESULTS, RECOMMEND RECOLLECT TO VERIFY  CO2 22 20 23 24  QUESTIONABLE RESULTS, RECOMMEND RECOLLECT TO VERIFY  GLUCOSE 319* 164* 246* 240* QUESTIONABLE RESULTS, RECOMMEND RECOLLECT TO VERIFY  BUN  85* 74* 73* 65* QUESTIONABLE RESULTS, RECOMMEND RECOLLECT TO VERIFY  CREATININE 3.04* 2.24* 2.34* 2.12* QUESTIONABLE RESULTS, RECOMMEND RECOLLECT TO VERIFY  CALCIUM 7.0* 5.8* 6.6* 6.6* QUESTIONABLE RESULTS, RECOMMEND RECOLLECT TO VERIFY   Liver Function Tests:  Recent Labs Lab 10/09/13 0400 10/14/13 0545  AST 52* 19  ALT 92* 12  ALKPHOS 107 57  BILITOT 0.2* <0.2*  PROT 4.8* 6.0  ALBUMIN 1.4* 1.7*   CBC:  Recent Labs Lab 10/09/13 0400 10/11/13 0130 10/11/13 0415 10/12/13 0445 10/14/13 0545  WBC 4.0 4.9 4.3 4.3 QUESTIONABLE RESULTS, RECOMMEND RECOLLECT TO VERIFY  NEUTROABS  --   --   --  3.8 QUESTIONABLE RESULTS, RECOMMEND RECOLLECT TO VERIFY  HGB 7.7* 8.1* 7.6* 7.8* QUESTIONABLE RESULTS, RECOMMEND RECOLLECT TO VERIFY  HCT 22.6* 24.2* 22.5* 23.4* QUESTIONABLE RESULTS, RECOMMEND RECOLLECT TO VERIFY  MCV 85.9 86.7 87.2 89.0 QUESTIONABLE RESULTS, RECOMMEND RECOLLECT TO VERIFY  PLT 14* 76* 65* 54* QUESTIONABLE RESULTS, RECOMMEND RECOLLECT TO VERIFY   Cardiac Enzymes: No results found for this basename: CKTOTAL, CKMB, CKMBINDEX, TROPONINI,  in the last 168 hours BNP (last 3 results)  Recent Labs  10/06/13 0657  PROBNP 8619.0*   CBG:  Recent Labs Lab 10/13/13 1135 10/13/13 1223 10/13/13 1656 10/13/13 2206 10/13/13 2223  GLUCAP 352* 311* 239* 243* 259*    Recent Results (from the past 240 hour(s))  CULTURE, BLOOD (ROUTINE  X 2)     Status: None   Collection Time    10/06/13  6:58 AM      Result Value Ref Range Status   Specimen Description BLOOD   Final   Special Requests BOTTLES DRAWN AEROBIC AND ANAEROBIC LEFT AC 5 CC   Final   Culture  Setup Time     Final   Value: 10/06/2013 10:33     Performed at [facility]   Culture     Final   Value: KLEBSIELLA PNEUMONIAE     Note: Gram Stain Report Called to,Read Back By and Verified With: BECKY DAVIS ON 10/06/2013 AT 8:12P BY WILEJ     Performed at [facility]   Report Status 10/08/2013 FINAL    Final   Organism ID, Bacteria KLEBSIELLA PNEUMONIAE   Final  URINE CULTURE     Status: None   Collection Time    10/06/13  7:55 AM      Result Value Ref Range Status   Specimen Description URINE, RANDOM   Final   Special Requests NONE   Final   Culture  Setup Time     Final   Value: 10/06/2013 10:41     Performed at [facility]     Final   Value: >=100,000 COLONIES/ML     Performed at [facility]   Culture     Final   Value: KLEBSIELLA PNEUMONIAE     Performed at [facility]   Report Status 10/08/2013 FINAL   Final   Organism ID, Bacteria KLEBSIELLA PNEUMONIAE   Final  CULTURE, BLOOD (ROUTINE X 2)     Status: None   Collection Time    10/06/13  9:05 AM      Result Value Ref Range Status   Specimen Description BLOOD   Final   Special Requests BOTTLES DRAWN AEROBIC AND ANAEROBIC PORT 5 CC   Final   Culture  Setup Time     Final   Value: 10/06/2013 12:38     Performed at [facility]   Culture     Final   Value: KLEBSIELLA PNEUMONIAE     Note: SUSCEPTIBILITIES PERFORMED ON PREVIOUS CULTURE WITHIN THE LAST 5 DAYS.     Note: Gram Stain Report Called to,Read Back By and Verified With: BECKY DAVIS ON 10/06/2013 AT 8:12P BY Dennard Nip     Performed at [facility]   Report Status 10/08/2013 FINAL   Final  MRSA PCR SCREENING     Status: None   Collection Time    10/06/13 11:08 AM      Result Value Ref Range Status   MRSA by PCR NEGATIVE  NEGATIVE Final   Comment:            The GeneXpert MRSA Assay (FDA     approved for NASAL specimens     only), is one component of a     comprehensive MRSA colonization     surveillance program. It is not     intended to diagnose MRSA     infection nor to guide or     monitor treatment for     MRSA infections.     Studies: No results found.  Scheduled Meds: . bisacodyl  5 mg Oral QHS  . cefTRIAXone (ROCEPHIN)  IV  1 g Intravenous Q24H  . diltiazem  180 mg Oral Daily  . hydrocortisone  sodium succinate  50 mg Intravenous Q12H  .  insulin aspart  0-20 Units Subcutaneous TID WC  . insulin aspart  0-5 Units Subcutaneous QHS  . insulin glargine  24 Units Subcutaneous Daily  . levothyroxine  50 mcg Oral QAC breakfast  . magnesium oxide  200 mg Oral Daily  . metoprolol  2.5 mg Intravenous 4 times per day  . sodium chloride  500 mL Intravenous Once  . sodium chloride  3 mL Intravenous Q12H  . tiotropium  18 mcg Inhalation Daily   Continuous Infusions: . lactated ringers    . phenylephrine (NEO-SYNEPHRINE) Adult infusion Stopped (10/12/13 0200)   Active Problems:   Cardiac pacemaker in situ   Diffuse large B cell lymphoma   Hypothyroidism   T2 NIDDM   Non-Hodgkin's lymphoma of abdomen   Antineoplastic chemotherapy induced pancytopenia   Acute renal failure   Anemia of chronic disease   COPD (chronic obstructive pulmonary disease)   Atrial fibrillation with RVR   Hydronephrosis, right   UTI (lower urinary tract infection)   CKD (chronic kidney disease) stage 3, GFR 30-59 ml/min   Acute encephalopathy   Sepsis   Severe sepsis with septic shock  Time spent: 25  This note has been created with Surveyor, quantity. Any transcriptional errors are unintentional.   Marzetta Board, MD [facility] Pager [phone]. If 7 PM - 7 AM, please contact night-coverage at [email or web address], password [facility] 10/14/2013, 7:34 AM  LOS: 8 days

## 2013-10-14 NOTE — Progress Notes (Signed)
Patient ID: SHATORI BERTUCCI, female   DOB: 1940-09-12, 74 y.o.   MRN: [id number]   Pt c/o wanting to be put to sleep to have new IV inserted in [place]. IV team/Lisa here and concerned port will clot if pt doesn't get fluids started soon (old IV has been removed). Pt without flank pain or bladder pain.    Intake/Output Summary (Last 24 hours) at 10/14/13 1120 Last data filed at 10/14/13 0816  Gross per 24 hour  Intake    750 ml  Output   3550 ml  Net  -2800 ml     PE: NAD Urine clear  BMET    Component Value Date/Time   NA 143 10/14/2013 0815   NA 131* 09/29/2013 0853   NA 141 10/12/2011 0957   K 3.0* 10/14/2013 0815   K 4.7 09/29/2013 0853   K 4.4 10/12/2011 0957   CL 101 10/14/2013 0815   CL 102 09/21/2012 0917   CL 95* 10/12/2011 0957   CO2 28 10/14/2013 0815   CO2 25 09/29/2013 0853   CO2 29 10/12/2011 0957   GLUCOSE 199* 10/14/2013 0815   GLUCOSE 445* 09/29/2013 0853   GLUCOSE 161* 09/21/2012 0917   GLUCOSE 131* 10/12/2011 0957   BUN 57* 10/14/2013 0815   BUN 47.1* 09/29/2013 0853   BUN 15 10/12/2011 0957   CREATININE 1.75* 10/14/2013 0815   CREATININE 1.7* 09/29/2013 0853   CREATININE 1.10 04/19/2013 1420   CALCIUM 6.9* 10/14/2013 0815   CALCIUM 9.3 09/29/2013 0853   CALCIUM 9.1 10/12/2011 0957   GFRNONAA 28* 10/14/2013 0815   GFRNONAA 50* 04/19/2013 1420   GFRAA 32* 10/14/2013 0815   GFRAA 58* 04/19/2013 1420     Imp -  Sepsis, Klebsiella UTI with bacteremia, CRF, right hydronephrosis, Relapsed, Refractory high-grade Non-Hodgkin's B-Cell Lymphoma - I recommended to patient she follow advice of IV team and let them get access soon.  -S/p right ureteral stent exchange 7/1 - CR improved to baseline, good UOP -will sign off, pt should f/u in 3 months to plan next stent change - likely in about 5 months. F/u on chart.

## 2013-10-14 NOTE — Progress Notes (Signed)
Notified Kathline Magic of pt going back into afib rate 110's, bp soft. No new orders at this time.

## 2013-10-15 LAB — CBC
HEMATOCRIT: 22.4 % — AB (ref 36.0–46.0)
Hemoglobin: 7.4 g/dL — ABNORMAL LOW (ref 12.0–15.0)
MCH: 30 pg (ref 26.0–34.0)
MCHC: 33 g/dL (ref 30.0–36.0)
MCV: 90.7 fL (ref 78.0–100.0)
Platelets: 34 10*3/uL — ABNORMAL LOW (ref 150–400)
RBC: 2.47 MIL/uL — ABNORMAL LOW (ref 3.87–5.11)
RDW: 20.4 % — ABNORMAL HIGH (ref 11.5–15.5)
WBC: 4.7 10*3/uL (ref 4.0–10.5)

## 2013-10-15 LAB — PROTIME-INR
INR: 1.11 (ref 0.00–1.49)
Prothrombin Time: 14.3 seconds (ref 11.6–15.2)

## 2013-10-15 LAB — BASIC METABOLIC PANEL
Anion gap: 14 (ref 5–15)
BUN: 46 mg/dL — AB (ref 6–23)
CO2: 24 mEq/L (ref 19–32)
CREATININE: 1.33 mg/dL — AB (ref 0.50–1.10)
Calcium: 6 mg/dL — CL (ref 8.4–10.5)
Chloride: 105 mEq/L (ref 96–112)
GFR calc Af Amer: 45 mL/min — ABNORMAL LOW (ref >90)
GFR, EST NON AFRICAN AMERICAN: 39 mL/min — AB (ref >90)
Glucose, Bld: 162 mg/dL — ABNORMAL HIGH (ref 70–99)
POTASSIUM: 3.1 meq/L — AB (ref 3.7–5.3)
Sodium: 143 mEq/L (ref 137–147)

## 2013-10-15 LAB — GLUCOSE, CAPILLARY
GLUCOSE-CAPILLARY: 200 mg/dL — AB (ref 70–99)
GLUCOSE-CAPILLARY: 262 mg/dL — AB (ref 70–99)

## 2013-10-15 MED ORDER — POTASSIUM CHLORIDE CRYS ER 20 MEQ PO TBCR
40.0000 meq | EXTENDED_RELEASE_TABLET | Freq: Three times a day (TID) | ORAL | Status: AC
  Administered 2013-10-15 (×3): 40 meq via ORAL
  Filled 2013-10-15 (×3): qty 2

## 2013-10-15 MED ORDER — SODIUM CHLORIDE 0.9 % IV SOLN
1.0000 g | Freq: Once | INTRAVENOUS | Status: AC
  Administered 2013-10-15: 1 g via INTRAVENOUS
  Filled 2013-10-15: qty 10

## 2013-10-15 NOTE — Progress Notes (Signed)
Recd from ICU condition stable. nio change in initial am assessment. Cont with current plan of care

## 2013-10-15 NOTE — Progress Notes (Addendum)
PROGRESS NOTE  [name] OYD:[id number] DOB: 09/02/1940 DOA: 10/06/2013 PCP: [name], MD  HPI: 73 year old female with past medical history of diffuse B-cell lymphoma (under the care of Dr. [name]), on chemotherapy with Revlimid and Rituxan, history of atrial fibrillation on Coumadin, sick sinus syndrome, status post pacemaker placement, recent admission in May 2015 for acute on chronic kidney disease and right hydronephrosis who presented from skilled nursing facility with reports of altered mental status, lethargy for the past 24 hours prior to this admission.   Assessment/Plan:  Septic shock - in the setting of urinary tract infection - off pressors since 7/1 - continue Ceftriaxone  - continue steroids, transition to po tomorrow.  Relapsed, Refractory high-grade Non-Hodgkin's B-Cell Lymphoma - chemotherapy on hold, may need palliative care consult soon. She had 4th line therapy. Dr. [name] following, will touch base with him tomorrow.  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 [name] due to thrombocytopenia.  SSS s/p pacemaker COPD - stable, no wheezing  - on room air DM - adjust her regimen as needed - continue SSI. Hypothyroidism - continue Synthroid Acute encephalopathy - due to multiple active problems, stable   SIGNIFICANT EVENTS:  10/2010 - Dx with NHL  04/2011 - Chemo R CHOP + XRT with good response  10/2012 - Recurrence, Rx with salvage chemo  05/2013 - Recurrence, CT A/P >> increased LAN with severe R hydro  06/2013 - Admission for sepsis, pyelo  07/14/13 - R ureteral stent placement  5/20-5/28 - Admission for  Enterococcus UTI / Sepsis. AKI/CKD- multi-factorial with R-sided hydro, nephrotoxic agents, urosepsis, hypercalcemia, and volume depletion related to diarrhea as well as nephrotoxic agents. Improved with IVF's. Creatinine as high as 7.6 down to 2.58 on d/c. She underwent renal ultrasound which showed right stent in place, no hydronephrosis on the right or the left.  ......................................................................................................................................................  6/26 - Readmit with c/o fever, increased swelling, periods of hypoglycemia. UTI +, Sepsis  6/29 - Resolved sepsis, off pressors. Significant thrombocytopenia  7/1 - ureteral stent change, transiently needed pressors   STUDIES:  6/26 renal US >> no hydronephrosis, stent shadow  LINES / TUBES:  R CW Port >>>  CULTURES: Hx Enterococcus UTI 5/20>> sens Vanco, nitrofurantioin  BCx2 6/26 >>klebsiella>>sens zosyn  UC 6/26 >>klebsiella>>sens zosyn  ANTIBIOTICS:  Rocephin 6/26 >>>x1  Zosyn 6/26 >>>6/29  Vanco 6/26 >>> 6/28  Rocephin 6/29 >>  Diet: NPO Fluids: none DVT Prophylaxis: SCD  Code Status: Partial Family Communication: none this morning  Disposition Plan: remain hospitalized, transfer telemetry 7/5  Consultants:  PCCM  Urology  Oncology  HPI/Subjective: Feeling well, no complaints, confused  Objective: Filed Vitals:   10/15/13 0400 10/15/13 0500 10/15/13 0600 10/15/13 0624  BP:    113/62  Pulse: 69 70 66 70  Temp:      TempSrc:      Resp: 12 11 14 14   Height:      Weight:      SpO2: 97% 97% 97% 96%    Intake/Output Summary (Last 24 hours) at 10/15/13 0743 Last data filed at 10/15/13 0621  Gross per 24 hour  Intake    800 ml  Output   3452  ml  Net  -2652 ml   Filed Weights   10/12/13 0500 10/13/13 0500 10/14/13 0500  Weight: 99.746 kg (219 lb 14.4 oz) 100.562 kg (221 lb 11.2 oz) 97.206 kg (214 lb 4.8 oz)   Exam:  General:   NAD  Cardiovascular: regular rate and rhythm, without MRG  Respiratory: good air movement, clear to auscultation throughout, no wheezing, ronchi or rales  Abdomen: soft, not tender to palpation, positive bowel sounds  MSK: no peripheral edema  Neuro: non focal  Data Reviewed: Basic Metabolic Panel:  Recent Labs Lab 10/12/13 0445 10/13/13 0440 10/14/13 0545 10/14/13 0815 10/15/13 0544  NA 133* 141 QUESTIONABLE RESULTS, RECOMMEND RECOLLECT TO VERIFY 143 143  K 3.6* 3.2* QUESTIONABLE RESULTS, RECOMMEND RECOLLECT TO VERIFY 3.0* 3.1*  CL 95* 101 QUESTIONABLE RESULTS, RECOMMEND RECOLLECT TO VERIFY 101 105  CO2 23 24 QUESTIONABLE RESULTS, RECOMMEND RECOLLECT TO VERIFY 28 24  GLUCOSE 246* 240* QUESTIONABLE RESULTS, RECOMMEND RECOLLECT TO VERIFY 199* 162*  BUN 73* 65* QUESTIONABLE RESULTS, RECOMMEND RECOLLECT TO VERIFY 57* 46*  CREATININE 2.34* 2.12* QUESTIONABLE RESULTS, RECOMMEND RECOLLECT TO VERIFY 1.75* 1.33*  CALCIUM 6.6* 6.6* QUESTIONABLE RESULTS, RECOMMEND RECOLLECT TO VERIFY 6.9* 6.0*   Liver Function Tests:  Recent Labs Lab 10/09/13 0400 10/14/13 0545 10/14/13 0815  AST 52* 19 11  ALT 92* 12 33  ALKPHOS 107 57 82  BILITOT 0.2* <0.2* 0.2*  PROT 4.8* 6.0 5.2*  ALBUMIN 1.4* 1.7* 2.1*   CBC:  Recent Labs Lab 10/11/13 0415 10/12/13 0445 10/14/13 0545 10/14/13 0815 10/15/13 0544  WBC 4.3 4.3 QUESTIONABLE RESULTS, RECOMMEND RECOLLECT TO VERIFY 5.5 4.7  NEUTROABS  --  3.8 QUESTIONABLE RESULTS, RECOMMEND RECOLLECT TO VERIFY 4.9  --   HGB 7.6* 7.8* QUESTIONABLE RESULTS, RECOMMEND RECOLLECT TO VERIFY 8.3* 7.4*  HCT 22.5* 23.4* QUESTIONABLE RESULTS, RECOMMEND RECOLLECT TO VERIFY 25.4* 22.4*  MCV 87.2 89.0 QUESTIONABLE RESULTS, RECOMMEND RECOLLECT TO VERIFY 89.1 90.7  PLT 65* 54* QUESTIONABLE RESULTS, RECOMMEND RECOLLECT TO VERIFY 46* 34*   Cardiac Enzymes: No results found for this basename: CKTOTAL, CKMB, CKMBINDEX, TROPONINI,  in the last 168 hours BNP (last 3  results)  Recent Labs  10/06/13 0657  PROBNP 8619.0*   CBG:  Recent Labs Lab 10/13/13 2223 10/14/13 0754 10/14/13 1225 10/14/13 1653 10/14/13 2215  GLUCAP 259* 197* 328* 298* 209*    Recent Results (from the past 240 hour(s))  CULTURE, BLOOD (ROUTINE X 2)     Status: None   Collection Time    10/06/13  6:58 AM      Result Value Ref Range Status   Specimen Description BLOOD   Final   Special Requests BOTTLES DRAWN AEROBIC AND ANAEROBIC LEFT AC 5 CC   Final   Culture  Setup Time     Final   Value: 10/06/2013 10:33     Performed at [facility]   Culture     Final   Value: KLEBSIELLA PNEUMONIAE     Note: Gram Stain Report Called to,Read Back By and Verified With: BECKY DAVIS ON 10/06/2013 AT 8:12P BY Dennard Nip     Performed at [facility]   Report Status 10/08/2013 FINAL   Final   Organism ID, Bacteria KLEBSIELLA PNEUMONIAE   Final  URINE CULTURE     Status: None   Collection Time    10/06/13  7:55 AM      Result Value Ref Range Status   Specimen Description URINE, RANDOM   Final   Special Requests NONE  Final   Culture  Setup Time     Final   Value: 10/06/2013 10:41     Performed at [place]     Final   Value: >=100,000 COLONIES/ML     Performed at [facility]   Culture     Final   Value: KLEBSIELLA PNEUMONIAE     Performed at [facility]   Report Status 10/08/2013 FINAL   Final   Organism ID, Bacteria KLEBSIELLA PNEUMONIAE   Final  CULTURE, BLOOD (ROUTINE X 2)     Status: None   Collection Time    10/06/13  9:05 AM      Result Value Ref Range Status   Specimen Description BLOOD   Final   Special Requests BOTTLES DRAWN AEROBIC AND ANAEROBIC PORT 5 CC   Final   Culture  Setup Time     Final   Value: 10/06/2013 12:38     Performed at [facility]   Culture     Final   Value: KLEBSIELLA PNEUMONIAE     Note: SUSCEPTIBILITIES PERFORMED ON PREVIOUS CULTURE WITHIN THE LAST 5 DAYS.     Note: Gram  Stain Report Called to,Read Back By and Verified With: BECKY DAVIS ON 10/06/2013 AT 8:12P BY Dennard Nip     Performed at [facility]   Report Status 10/08/2013 FINAL   Final  MRSA PCR SCREENING     Status: None   Collection Time    10/06/13 11:08 AM      Result Value Ref Range Status   MRSA by PCR NEGATIVE  NEGATIVE Final   Comment:            The GeneXpert MRSA Assay (FDA     approved for NASAL specimens     only), is one component of a     comprehensive MRSA colonization     surveillance program. It is not     intended to diagnose MRSA     infection nor to guide or     monitor treatment for     MRSA infections.     Studies: No results found.  Scheduled Meds: . bisacodyl  5 mg Oral QHS  . cefTRIAXone (ROCEPHIN)  IV  1 g Intravenous Q24H  . diltiazem  180 mg Oral Daily  . hydrocortisone sodium succinate  50 mg Intravenous Q12H  . insulin aspart  0-20 Units Subcutaneous TID WC  . insulin aspart  0-5 Units Subcutaneous QHS  . insulin glargine  24 Units Subcutaneous Daily  . levothyroxine  50 mcg Oral QAC breakfast  . magnesium oxide  200 mg Oral Daily  . metoprolol  2.5 mg Intravenous 4 times per day  . sodium chloride  3 mL Intravenous Q12H  . tiotropium  18 mcg Inhalation Daily   Continuous Infusions: . lactated ringers    . phenylephrine (NEO-SYNEPHRINE) Adult infusion Stopped (10/12/13 0200)   Active Problems:   Cardiac pacemaker in situ   Diffuse large B cell lymphoma   Hypothyroidism   T2 NIDDM   Non-Hodgkin's lymphoma of abdomen   Antineoplastic chemotherapy induced pancytopenia   Acute renal failure   Anemia of chronic disease   COPD (chronic obstructive pulmonary disease)   Atrial fibrillation with RVR   Hydronephrosis, right   UTI (lower urinary tract infection)   CKD (chronic kidney disease) stage 3, GFR 30-59 ml/min   Acute encephalopathy   Sepsis   Severe sepsis with septic shock  Time  spent: 25  This note has been created with Biomedical engineer. Any transcriptional errors are unintentional.   Marzetta Board, MD [facility] Pager [phone]. If 7 PM - 7 AM, please contact night-coverage at [email or web address], password [facility] 10/15/2013, 7:43 AM  LOS: 9 days

## 2013-10-16 ENCOUNTER — Other Ambulatory Visit: Payer: Self-pay

## 2013-10-16 LAB — COMPREHENSIVE METABOLIC PANEL
ALT: 27 U/L (ref 0–35)
AST: 11 U/L (ref 0–37)
Albumin: 2.2 g/dL — ABNORMAL LOW (ref 3.5–5.2)
Alkaline Phosphatase: 82 U/L (ref 39–117)
Anion gap: 13 (ref 5–15)
BUN: 49 mg/dL — ABNORMAL HIGH (ref 6–23)
CO2: 26 mEq/L (ref 19–32)
Calcium: 7.2 mg/dL — ABNORMAL LOW (ref 8.4–10.5)
Chloride: 98 mEq/L (ref 96–112)
Creatinine, Ser: 1.61 mg/dL — ABNORMAL HIGH (ref 0.50–1.10)
GFR calc Af Amer: 36 mL/min — ABNORMAL LOW (ref >90)
GFR calc non Af Amer: 31 mL/min — ABNORMAL LOW (ref >90)
Glucose, Bld: 198 mg/dL — ABNORMAL HIGH (ref 70–99)
Potassium: 4.6 mEq/L (ref 3.7–5.3)
Sodium: 137 mEq/L (ref 137–147)
Total Bilirubin: <0.2 mg/dL — ABNORMAL LOW (ref 0.3–1.2)
Total Protein: 5 g/dL — ABNORMAL LOW (ref 6.0–8.3)

## 2013-10-16 LAB — PROTIME-INR
INR: 1.15 (ref 0.00–1.49)
Prothrombin Time: 14.7 seconds (ref 11.6–15.2)

## 2013-10-16 LAB — CBC
HCT: 26 % — ABNORMAL LOW (ref 36.0–46.0)
HEMOGLOBIN: 8.6 g/dL — AB (ref 12.0–15.0)
MCH: 29.9 pg (ref 26.0–34.0)
MCHC: 33.1 g/dL (ref 30.0–36.0)
MCV: 90.3 fL (ref 78.0–100.0)
Platelets: 46 10*3/uL — ABNORMAL LOW (ref 150–400)
RBC: 2.88 MIL/uL — ABNORMAL LOW (ref 3.87–5.11)
RDW: 20.4 % — ABNORMAL HIGH (ref 11.5–15.5)
WBC: 6.3 10*3/uL (ref 4.0–10.5)

## 2013-10-16 LAB — GLUCOSE, CAPILLARY
Glucose-Capillary: 216 mg/dL — ABNORMAL HIGH (ref 70–99)
Glucose-Capillary: 202 mg/dL — ABNORMAL HIGH (ref 70–99)
Glucose-Capillary: 151 mg/dL — ABNORMAL HIGH (ref 70–99)
Glucose-Capillary: 243 mg/dL — ABNORMAL HIGH (ref 70–99)
Glucose-Capillary: 175 mg/dL — ABNORMAL HIGH (ref 70–99)
Glucose-Capillary: 137 mg/dL — ABNORMAL HIGH (ref 70–99)

## 2013-10-16 MED ORDER — METOPROLOL TARTRATE 12.5 MG HALF TABLET
12.5000 mg | ORAL_TABLET | Freq: Two times a day (BID) | ORAL | Status: DC
  Administered 2013-10-16 – 2013-10-18 (×5): 12.5 mg via ORAL
  Filled 2013-10-16 (×6): qty 1

## 2013-10-16 MED ORDER — DEXAMETHASONE 2 MG PO TABS
2.0000 mg | ORAL_TABLET | Freq: Two times a day (BID) | ORAL | Status: DC
  Administered 2013-10-16 – 2013-10-18 (×5): 2 mg via ORAL
  Filled 2013-10-16 (×6): qty 1

## 2013-10-16 NOTE — Progress Notes (Signed)
Pt sleeping. Pt denies any pain. Pt attempted BM this evening, but only had a smear. Will continue to monitor.

## 2013-10-16 NOTE — Progress Notes (Signed)
Physical Therapy Treatment Patient Details Name: [name] MRN: [id number] DOB: [birth year] Today's Date: 10/16/2013    History of Present Illness 73 yo female admitted with acute on chronic renal failure, ,  Hx of SSS-pacemeaker, Afib, DM, COPD, gout, lymphoma. S/P ureter stent .    PT Comments    Pt AxO x 4 and required MAX encouragement to participate.  Pt stated she lives alone in a double wide and uses her scooter to get around.  Pt also stated she sleeps in her lift recliner chair. Pt required + 2 total assist to transition self from supine to EOB with increased time.  Pt stated "I'm shiting". Pt was unappropriate verbally throughout session despite redirection and encouragement that we were there to assisted her.  During sit to stand off bed pt was unable to clear buttocks off bed and unable to fully support her body weight. Pt required + 3 assist to perform partial 1/4 turn from bed to [facility] to finish BM.  Pt required total assist for hygiene.  Attempted to pivot off BSC to recliner however pt unable to stand upright enough to clear buttocks again.  So used [employer] LIFT to get pt to recliner.    Follow Up Recommendations  SNF     Equipment Recommendations       Recommendations for Other Services       Precautions / Restrictions Precautions Precautions: Fall Restrictions Weight Bearing Restrictions: No    Mobility  Bed Mobility Overal bed mobility: Needs Assistance Bed Mobility: Supine to Sit     Supine to sit: +2 for physical assistance;Max assist     General bed mobility comments: Pt required increased time and MAX encouragement to increase self assist.  Pt was unable to scoot to EOB and required total assist using bed pad to pull hips around enough to place B feet on floor.   Transfers Overall transfer level: Needs assistance Equipment used: None Transfers: Sit to/from [facility] Sit to Stand: +2 safety/equipment;+2 physical assistance;Total  assist Stand pivot transfers: +2 safety/equipment;+2 physical assistance;Total assist       General transfer comment: pt was able to partially stand but unable to clear hiips off bed.  Pt actively having a BM so physically assisted buttocks around 1/4 turn to [facility] so that she would not sit back on soiled bed.  Attempted sit to stand off BSC however unable due to lower surface level and MAX c/o fatigue.  So used [employer] lift to get pt to recliner.    Ambulation/Gait         Gait velocity: Unable to functioanlly clear buttocks off bed and unable to suuport her own body weight.       Stairs            Wheelchair Mobility    Modified Rankin (Stroke Patients Only)       Balance                                    Cognition Arousal/Alertness: Awake/alert Behavior During Therapy: WFL for tasks assessed/performed Overall Cognitive Status: Impaired/Different from baseline Area of Impairment: Orientation;Awareness;Memory Orientation Level: Place;Time;Situation                  Exercises Other Exercises Other Exercises: shoulder flexion AAROM/AROM X 10 (assist to initiate as pt stating "I cant do that" but demonstrated that she could. elbow flexion/extension X 15, wrist  flexion/extension X 15, digit flexion/extension X 15, abduction AAROM/AROM X 10. Again, assist to initiate as pt stating she couldnt do this exercise but once assisted to start exercise was able to.     General Comments        Pertinent Vitals/Pain     Home Living                      Prior Function            PT Goals (current goals can now be found in the care plan section) Progress towards PT goals: Progressing toward goals    Frequency  Min 3X/week    PT Plan      Co-evaluation             End of Session Equipment Utilized During Treatment: Gait belt Activity Tolerance: Patient limited by fatigue;Other (comment) (BMI) Patient left: in chair;with call  bell/phone within reach     Time: 1120-1200 PT Time Calculation (min): 40 min  Charges:  $Therapeutic Activity: 38-52 mins                    G Codes:      Rica Koyanagi  PTA [facility]  Acute  Rehab Pager      [phone]

## 2013-10-16 NOTE — Progress Notes (Signed)
Nutrition Brief Note  Patient identified as LOS Day 10  Wt Readings from Last 15 Encounters:  10/16/13 214 lb 11.7 oz (97.4 kg)  10/16/13 214 lb 11.7 oz (97.4 kg)  10/16/13 214 lb 11.7 oz (97.4 kg)  10/16/13 214 lb 11.7 oz (97.4 kg)  10/16/13 214 lb 11.7 oz (97.4 kg)  09/15/13 203 lb 3.2 oz (92.171 kg)  09/07/13 200 lb 2.8 oz (90.8 kg)  08/22/13 198 lb (89.812 kg)  08/02/13 208 lb 9.6 oz (94.62 kg)  07/27/13 214 lb (97.07 kg)  07/19/13 214 lb 3.2 oz (97.16 kg)  07/17/13 214 lb 3 oz (97.155 kg)  07/10/13 222 lb (100.699 kg)  07/04/13 222 lb 6.4 oz (100.88 kg)  06/28/13 234 lb 2.1 oz (106.2 kg)    Body mass index is 43.35 kg/(m^2). Patient meets criteria for Morbid Obesity/Obesity III based on current BMI.   Current diet order is Carb Modified, patient is consuming approximately 75-100% of meals at this time. Labs and medications reviewed.   Pt currently confused. Nursing students working with patient reported assisting pt with ordering meals and feeding. Pt eating well, consuming 75% of meals. Weight history appears relatively stable per previous medical history, with pt maintaining 214 lbs past 3-4 months. Palliative care consult pending.  No nutrition interventions warranted at this time. If nutrition issues arise, please consult RD.   Atlee Abide MS RD LDN Clinical Dietitian HALPF:[phone]

## 2013-10-16 NOTE — Progress Notes (Signed)
Thank you for consulting the Palliative Medicine Team at [facility] to meet your patient's and family's needs.   The reason that you asked [place] to see your patient is  For Clarification of GOC and options  We are awaiting call back from family and HPOA to schedule a meeting:   The Surrogate decision makers are:   Tressie Stalker and Justice Rocher   Other family members that need to be present: requesting son Cherre Huger to be present   Your patient is able/unable to participate:unable   Wadie Lessen NP  Palliative Medicine Team Team Phone # [phone] Pager [phone]

## 2013-10-16 NOTE — Progress Notes (Signed)
Inpatient Diabetes Program Recommendations  AACE/ADA: New Consensus Statement on Inpatient Glycemic Control (2013)  Target Ranges:  Prepandial:   less than 140 mg/dL      Peak postprandial:   less than 180 mg/dL (1-2 hours)      Critically ill patients:  140 - 180 mg/dL  Results for [name], [name] (MRN [id number]) as of 10/16/2013 10:42  Ref. Range 10/14/2013 12:25 10/14/2013 16:53 10/14/2013 22:15 10/15/2013 07:54 10/15/2013 21:49  Glucose-Capillary Latest Range: 70-99 mg/dL 328 (H) 298 (H) 209 (H) 200 (H) 262 (H)   Consider adding Novolog Meal Coverage dose 4 units TID per Glycemic Control Order Set.   Thank you  Raoul Pitch BSN, RN,CDE Inpatient Diabetes Coordinator [phone] (team pager)

## 2013-10-16 NOTE — Progress Notes (Addendum)
Occupational Therapy Treatment Patient Details Name: [name] MRN: [id number] DOB: December 15, 1940 Today's Date: 10/16/2013    History of present illness 73 yo female admitted with acute on chronic renal failure, ,  Hx of SSS-pacemeaker, Afib, DM, COPD, gout, lymphoma. S/P ureter stent .   OT comments  Pt tolerated bilateral UE exercises but needs increased time and some assist to initiate exercises but then does well after that. She is not oriented to current location/situation.    Follow Up Recommendations  Supervision/Assistance - 24 hour;SNF    Equipment Recommendations  None recommended by OT    Recommendations for Other Services      Precautions / Restrictions Precautions Precautions: Fall Restrictions Weight Bearing Restrictions: No       Mobility Bed Mobility                  Transfers                      Balance                                   ADL                                         General ADL Comments: Pt getting ready to order breakfast. Assisted pt to call and place order. She is not oriented to current location and when asked by another staff member where she is, she became a little upset and states, "I am not crazy. I know where I am" but was still not able to state current location when asked again. Explained to pt she is in the hospital and role of OT assisting her currently. She frequently stated she couldnt perform exercises with shoulder but once assisted to start each exercise, she demonstrated that she could perform them on her own.       Vision                     Perception     Praxis      Cognition   Behavior During Therapy: [facility] for tasks assessed/performed Overall Cognitive Status: Impaired/Different from baseline Area of Impairment: Orientation;Awareness;Memory Orientation Level: Place;Time;Situation                   Extremity/Trunk Assessment                Exercises Other Exercises Other Exercises: shoulder flexion AAROM/AROM X 10 (assist to initiate as pt stating "I cant do that" but demonstrated that she could. elbow flexion/extension X 15, wrist flexion/extension X 15, digit flexion/extension X 15, abduction AAROM/AROM X 10. Again, assist to initiate as pt stating she couldnt do this exercise but once assisted to start exercise was able to.    Shoulder Instructions       General Comments      Pertinent Vitals/ Pain       No complaint of  Home Living                                          Prior Functioning/Environment              Frequency Min 2X/week  Progress Toward Goals  OT Goals(current goals can now be found in the care plan section)  Progress towards OT goals: Progressing toward goals     Plan Discharge plan remains appropriate    Co-evaluation                 End of Session     Activity Tolerance Patient tolerated treatment well   Patient Left in bed;with call bell/phone within reach;with bed alarm set   Nurse Communication          Time: 1000-1032 OT Time Calculation (min): 32 min  Charges: OT General Charges $OT Visit: 1 Procedure OT Treatments $Therapeutic Exercise: 23-37 mins  Jules Schick [phone] 10/16/2013, 11:34 AM

## 2013-10-16 NOTE — Progress Notes (Signed)
[name]   DOB:1941-01-09   UD#:[id number]   CHY#:[id number]  Subjective: Patient moved out of ICU over the weekend.  She tolerates PO without difficulty.  Palliative care on board.     Objective:  Filed Vitals:   10/16/13 1459  BP: 109/71  Pulse: 68  Temp: 97.4 F (36.3 C)  Resp: 18    Body mass index is 43.35 kg/(m^2).  Intake/Output Summary (Last 24 hours) at 10/16/13 1942 Last data filed at 10/16/13 1915  Gross per 24 hour  Intake    460 ml  Output   2800 ml  Net  -2340 ml    Oriented to self. Confused.   Sclerae unicteric  Oropharynx clear  No peripheral adenopathy  Lungs clear -- no rales or rhonchi  Regular rate and rhythm S1 S2 without murmurs  Abdomen obese, NTTP +BS  MSK trace edema bilaterally  Neuro, moves all her extremities.     CBG (last 3)   Recent Labs  10/15/13 2149 10/16/13 1251 10/16/13 1631  GLUCAP 262* 243* 175*     Labs:  Lab Results  Component Value Date   WBC 6.3 10/16/2013   HGB 8.6* 10/16/2013   HCT 26.0* 10/16/2013   MCV 90.3 10/16/2013   PLT 46* 10/16/2013   NEUTROABS 4.9 10/14/2013     GFR Estimated Creatinine Clearance: 32.4 ml/min (by C-G formula based on Cr of 1.61). Liver Function Tests:  Recent Labs Lab 10/14/13 0545 10/14/13 0815 10/16/13 0422  AST 19 11 11   ALT 12 33 27  ALKPHOS 57 82 82  BILITOT <0.2* 0.2* <0.2*  PROT 6.0 5.2* 5.0*  ALBUMIN 1.7* 2.1* 2.2*   No results found for this basename: LIPASE, AMYLASE,  in the last 168 hours No results found for this basename: AMMONIA,  in the last 168 hours Coagulation profile  Recent Labs Lab 10/13/13 0440 10/14/13 0545 10/14/13 0815 10/15/13 0544 10/16/13 0422  INR 1.20 2.08* 1.13 1.11 1.15    CBC:  Recent Labs Lab 10/12/13 0445 10/14/13 0545 10/14/13 0815 10/15/13 0544 10/16/13 0422  WBC 4.3 QUESTIONABLE RESULTS, RECOMMEND RECOLLECT TO VERIFY 5.5 4.7 6.3  NEUTROABS 3.8 QUESTIONABLE RESULTS, RECOMMEND RECOLLECT TO VERIFY 4.9  --   --   HGB 7.8*  QUESTIONABLE RESULTS, RECOMMEND RECOLLECT TO VERIFY 8.3* 7.4* 8.6*  HCT 23.4* QUESTIONABLE RESULTS, RECOMMEND RECOLLECT TO VERIFY 25.4* 22.4* 26.0*  MCV 89.0 QUESTIONABLE RESULTS, RECOMMEND RECOLLECT TO VERIFY 89.1 90.7 90.3  PLT 54* QUESTIONABLE RESULTS, RECOMMEND RECOLLECT TO VERIFY 46* 34* 46*   CBG:  Recent Labs Lab 10/14/13 2215 10/15/13 0754 10/15/13 2149 10/16/13 1251 10/16/13 1631  GLUCAP 209* 200* 262* 243* 175*   D-Dimer No results found for this basename: DDIMER,  in the last 72 hours Hgb A1c No results found for this basename: HGBA1C,  in the last 72 hours Microbiology No results found for this or any previous visit (from the past 240 hour(s)). Studies:  No results found.  Assessment/Plan: 73 y.o.  1. AMS secondary to #2, #3, #4, improving.  --  On ceftriaxone. Blood, urine cultures with Kleb Pneu.  Afebrile.    2. Acute of Chronic Renal Failure.  --  Agree probable ATN secondary dehydration plus sepsis. Creatinine 1.33 - 1.61. S/p stent exchange 07/01.  3. Urosepsis w tachycardia and hypotension, resolved.  --Given stress doses of hydrocortisone. Off pressors. Transferred out of MICU/Stepdown.  4. 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  --HOLD further chemotherapy in the setting of above.  --Patient does not want intubation or chest compressions per prior discussion.  --Prognosis overall is poor given this is her 4th line therapy with response rates less that 30%.   --Palliative care consulted today on 10/16/2013.    5. S/P R. Hydronephrosis, s/p R ureteral stent (04/03)  --Patient had R ureteral stent placed on 04/03. Exchanged on 10/11/2013.   6. Anemia, Thrombocytopenia.  -- Likely secondary to sepsis with DIC.  Plts, 46. No signs of bleeding.    Transfuse for active bleeding or plts less than 10. Will receive one bag of plts per  urology for procedure.   Other causes to consider would be lymphoma with bone marrow involvement versus chemotherapy-induced.  --I would hold A/C when plts are less than 50 as her bleeding risk would be high.    7. Hypothyroidism: Levothyroxine.  8. Hypertension: Anti-hypertensives held due to hypotension.  9. Diabetes mellitus, type II: Worst with steroids.  Hoping to taper off.  HBA1c 8.5. 10. Hyperlipidemia: Pravastatin per PCP.  11.History of atrial fibrillation: Diltiazem, pacemaker. Coumadin being managed per her PCP previously. HOLD coumadin due to decrease in plts.  12. COPD/ Chronic Bronchitis exacerbation.  --Patient advised to continue albuterol q 6 hours as needed.  13. Poor nutrition, hypoalbuminemia. Nutrition as tolerated with ensure/boost.  14. Disposition. Partial.   Thanks for taking care of [name].    Dana Falkner, MD 10/16/2013  7:42 PM

## 2013-10-16 NOTE — Progress Notes (Signed)
PROGRESS NOTE  [name] UVO:[id number] DOB: 1940-11-13 DOA: 10/06/2013 PCP: [name], MD  HPI: 73 year old female with past medical history of diffuse B-cell lymphoma (under the care of Dr. [name]), on chemotherapy with Revlimid and Rituxan, history of atrial fibrillation on Coumadin, sick sinus syndrome, status post pacemaker placement, recent admission in May 2015 for acute on chronic kidney disease and right hydronephrosis who presented from skilled nursing facility with reports of altered mental status, lethargy for the past 24 hours prior to this admission.   Assessment/Plan:  Relapsed, Refractory high-grade Non-Hodgkin's B-Cell Lymphoma - chemotherapy on hold - palliative care consulted today  - She had 4th line therapy. Dr. 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  Hypokalemia - replete as needed Hypocalcemia Anemia/Thrombocytopenia - slight worsening, no bleeding, monitor and transfuse as needed R hydronephrosis s/p R ureteral stent placement 4/3 exchanged 7/2 - appreciate urology input A fib with RVR - continue Diltiazem - hold Coumadin per Dr. [name] due to thrombocytopenia.  SSS s/p pacemaker COPD - stable, no wheezing  - on room air DM - adjust her regimen as needed - continue SSI. Hypothyroidism - continue Synthroid Acute encephalopathy - due to multiple active problems, stable   SIGNIFICANT EVENTS:  10/2010 - Dx with NHL  04/2011 - Chemo R CHOP + XRT with good response  10/2012 - Recurrence, Rx with salvage chemo  05/2013 - Recurrence, CT A/P >> increased LAN with severe R hydro  06/2013 - Admission for sepsis, pyelo  07/14/13 - R ureteral stent placement    5/20-5/28 - Admission for Enterococcus UTI / Sepsis. AKI/CKD- multi-factorial with R-sided hydro, nephrotoxic agents, urosepsis, hypercalcemia, and volume depletion related to diarrhea as well as nephrotoxic agents. Improved with IVF's. Creatinine as high as 7.6 down to 2.58 on d/c. She underwent renal ultrasound which showed right stent in place, no hydronephrosis on the right or the left.  ......................................................................................................................................................  6/26 - Readmit with c/o fever, increased swelling, periods of hypoglycemia. UTI +, Sepsis  6/29 - Resolved sepsis, off pressors. Significant thrombocytopenia  7/1 - ureteral stent change, transiently needed pressors   STUDIES:  6/26 renal US >> no hydronephrosis, stent shadow  LINES / TUBES:  R CW Port >>>  CULTURES: Hx Enterococcus UTI 5/20>> sens Vanco, nitrofurantioin  BCx2 6/26 >>klebsiella>>sens zosyn  UC 6/26 >>klebsiella>>sens zosyn  ANTIBIOTICS:  Rocephin 6/26 >>>x1  Zosyn 6/26 >>>6/29  Vanco 6/26 >>> 6/28  Rocephin 6/29 >>  Diet: NPO Fluids: none DVT Prophylaxis: SCD  Code Status: Partial Family Communication: d/w niece Disposition Plan: remain hospitalized, transfer telemetry 7/5  Consultants:  PCCM  Urology  Oncology  HPI/Subjective: Feeling well, no complaints, confused  Objective: Filed Vitals:   10/15/13 2150 10/16/13 0457 10/16/13 0950 10/16/13 1026  BP: 117/50 118/52 109/62   Pulse: 69 70 71   Temp: 98.1 F (36.7 C) 98.2 F (36.8 C) 98 F (36.7 C)   TempSrc: Oral Oral Oral   Resp: 20 18 17    Height:      Weight:  97.4 kg (214 lb 11.7 oz)    SpO2:  99% 99% 99%    Intake/Output Summary (Last 24 hours) at 10/16/13 1236 Last data filed at 10/16/13 7425  Gross per 24 hour  Intake    290 ml  Output   3550 ml  Net  -3260 ml   Filed Weights   10/13/13 0500 10/14/13 0500 10/16/13 0457  Weight: 100.562 kg  (221 lb 11.2 oz) 97.206 kg (214 lb 4.8 oz) 97.4 kg (214 lb 11.7 oz)   Exam:  General:  NAD  Cardiovascular: regular rate and rhythm, without MRG  Respiratory: good air movement, clear to auscultation throughout, no wheezing, ronchi or rales  Abdomen: soft, not tender to palpation, positive bowel sounds  MSK: no peripheral edema  Neuro: non focal  Data Reviewed: Basic Metabolic Panel:  Recent Labs Lab 10/13/13 0440 10/14/13 0545 10/14/13 0815 10/15/13 0544 10/16/13 0422  NA 141 QUESTIONABLE RESULTS, RECOMMEND RECOLLECT TO VERIFY 143 143 137  K 3.2* QUESTIONABLE RESULTS, RECOMMEND RECOLLECT TO VERIFY 3.0* 3.1* 4.6  CL 101 QUESTIONABLE RESULTS, RECOMMEND RECOLLECT TO VERIFY 101 105 98  CO2 24 QUESTIONABLE RESULTS, RECOMMEND RECOLLECT TO VERIFY 28 24 26   GLUCOSE 240* QUESTIONABLE RESULTS, RECOMMEND RECOLLECT TO VERIFY 199* 162* 198*  BUN 65* QUESTIONABLE RESULTS, RECOMMEND RECOLLECT TO VERIFY 57* 46* 49*  CREATININE 2.12* QUESTIONABLE RESULTS, RECOMMEND RECOLLECT TO VERIFY 1.75* 1.33* 1.61*  CALCIUM 6.6* QUESTIONABLE RESULTS, RECOMMEND RECOLLECT TO VERIFY 6.9* 6.0* 7.2*   Liver Function Tests:  Recent Labs Lab 10/14/13 0545 10/14/13 0815 10/16/13 0422  AST 19 11 11   ALT 12 33 27  ALKPHOS 57 82 82  BILITOT <0.2* 0.2* <0.2*  PROT 6.0 5.2* 5.0*  ALBUMIN 1.7* 2.1* 2.2*   CBC:  Recent Labs Lab 10/12/13 0445 10/14/13 0545 10/14/13 0815 10/15/13 0544 10/16/13 0422  WBC 4.3 QUESTIONABLE RESULTS, RECOMMEND RECOLLECT TO VERIFY 5.5 4.7 6.3  NEUTROABS 3.8 QUESTIONABLE RESULTS, RECOMMEND RECOLLECT TO VERIFY 4.9  --   --   HGB 7.8* QUESTIONABLE RESULTS, RECOMMEND RECOLLECT TO VERIFY 8.3* 7.4* 8.6*  HCT 23.4* QUESTIONABLE RESULTS, RECOMMEND RECOLLECT TO VERIFY 25.4* 22.4* 26.0*  MCV 89.0 QUESTIONABLE RESULTS, RECOMMEND RECOLLECT TO VERIFY 89.1 90.7 90.3  PLT 54* QUESTIONABLE RESULTS, RECOMMEND RECOLLECT TO VERIFY 46* 34* 46*   Cardiac Enzymes: No results found for this  basename: CKTOTAL, CKMB, CKMBINDEX, TROPONINI,  in the last 168 hours BNP (last 3 results)  Recent Labs  10/06/13 0657  PROBNP 8619.0*   CBG:  Recent Labs Lab 10/14/13 1225 10/14/13 1653 10/14/13 2215 10/15/13 0754 10/15/13 2149  GLUCAP 328* 298* 209* 200* 262*    No results found for this or any previous visit (from the past 240 hour(s)).   Studies: No results found.  Scheduled Meds: . bisacodyl  5 mg Oral QHS  . cefTRIAXone (ROCEPHIN)  IV  1 g Intravenous Q24H  . diltiazem  180 mg Oral Daily  . hydrocortisone sodium succinate  50 mg Intravenous Q12H  . insulin aspart  0-20 Units Subcutaneous TID WC  . insulin aspart  0-5 Units Subcutaneous QHS  . insulin glargine  24 Units Subcutaneous Daily  . levothyroxine  50 mcg Oral QAC breakfast  . magnesium oxide  200 mg Oral Daily  . metoprolol  2.5 mg Intravenous 4 times per day  . sodium chloride  3 mL Intravenous Q12H  . tiotropium  18 mcg Inhalation Daily   Continuous Infusions: . lactated ringers    . phenylephrine (NEO-SYNEPHRINE) Adult infusion Stopped (10/12/13 0200)   Active Problems:   Cardiac pacemaker in situ   Diffuse large B cell lymphoma   Hypothyroidism   T2 NIDDM   Non-Hodgkin's lymphoma of abdomen  Antineoplastic chemotherapy induced pancytopenia   Acute renal failure   Anemia of chronic disease   COPD (chronic obstructive pulmonary disease)   Atrial fibrillation with RVR   Hydronephrosis, right   UTI (lower urinary tract infection)   CKD (chronic kidney disease) stage 3, GFR 30-59 ml/min   Acute encephalopathy   Sepsis   Severe sepsis with septic shock  Time spent: 25  This note has been created with Surveyor, quantity. Any transcriptional errors are unintentional.   Marzetta Board, MD [facility] Pager [phone]. If 7 PM - 7 AM, please contact night-coverage at [email or web address], password [facility] 10/16/2013, 12:36 PM  LOS: 10 days

## 2013-10-17 ENCOUNTER — Encounter: Payer: Medicare Other | Admitting: Internal Medicine

## 2013-10-17 DIAGNOSIS — Z7189 Other specified counseling: Secondary | ICD-10-CM

## 2013-10-17 DIAGNOSIS — R5383 Other fatigue: Secondary | ICD-10-CM

## 2013-10-17 DIAGNOSIS — R5381 Other malaise: Secondary | ICD-10-CM

## 2013-10-17 DIAGNOSIS — Z515 Encounter for palliative care: Secondary | ICD-10-CM

## 2013-10-17 DIAGNOSIS — R531 Weakness: Secondary | ICD-10-CM

## 2013-10-17 LAB — BASIC METABOLIC PANEL
ANION GAP: 14 (ref 5–15)
BUN: 47 mg/dL — AB (ref 6–23)
CALCIUM: 7.3 mg/dL — AB (ref 8.4–10.5)
CO2: 26 mEq/L (ref 19–32)
Chloride: 101 mEq/L (ref 96–112)
Creatinine, Ser: 1.46 mg/dL — ABNORMAL HIGH (ref 0.50–1.10)
GFR calc non Af Amer: 35 mL/min — ABNORMAL LOW (ref >90)
GFR, EST AFRICAN AMERICAN: 40 mL/min — AB (ref >90)
Glucose, Bld: 173 mg/dL — ABNORMAL HIGH (ref 70–99)
Potassium: 4.1 mEq/L (ref 3.7–5.3)
Sodium: 141 mEq/L (ref 137–147)

## 2013-10-17 LAB — PROTIME-INR
INR: 1.12 (ref 0.00–1.49)
Prothrombin Time: 14.4 seconds (ref 11.6–15.2)

## 2013-10-17 LAB — GLUCOSE, CAPILLARY
GLUCOSE-CAPILLARY: 157 mg/dL — AB (ref 70–99)
Glucose-Capillary: 256 mg/dL — ABNORMAL HIGH (ref 70–99)
Glucose-Capillary: 243 mg/dL — ABNORMAL HIGH (ref 70–99)
Glucose-Capillary: 260 mg/dL — ABNORMAL HIGH (ref 70–99)

## 2013-10-17 LAB — CBC
HEMATOCRIT: 26.4 % — AB (ref 36.0–46.0)
Hemoglobin: 8.5 g/dL — ABNORMAL LOW (ref 12.0–15.0)
MCH: 29.6 pg (ref 26.0–34.0)
MCHC: 32.2 g/dL (ref 30.0–36.0)
MCV: 92 fL (ref 78.0–100.0)
PLATELETS: 41 10*3/uL — AB (ref 150–400)
RBC: 2.87 MIL/uL — ABNORMAL LOW (ref 3.87–5.11)
RDW: 20.4 % — ABNORMAL HIGH (ref 11.5–15.5)
WBC: 6.3 10*3/uL (ref 4.0–10.5)

## 2013-10-17 NOTE — Clinical Social Work Placement (Signed)
Clinical Social Work Department CLINICAL SOCIAL WORK PLACEMENT NOTE 10/17/2013  Patient:  JASZMINE, NAVEJAS  Account Number:  [id number] Admit date:  10/06/2013  Clinical Social Worker:  Daiva Huge  Date/time:  10/17/2013 02:39 PM  Clinical Social Work is seeking post-discharge placement for this patient at the following level of care:   SKILLED NURSING   (*CSW will update this form in Epic as items are completed)   10/17/2013  Patient/family provided with [place] Department of Clinical Social Work's list of facilities offering this level of care within the geographic area requested by the patient (or if unable, by the patient's family).  10/17/2013  Patient/family informed of their freedom to choose among providers that offer the needed level of care, that participate in Medicare, Medicaid or managed care program needed by the patient, have an available bed and are willing to accept the patient.  10/17/2013  Patient/family informed of [facility]' ownership interest in [facility], as well as of the fact that they are under no obligation to receive care at this facility.  PASARR submitted to EDS on 10/15/2013 PASARR number received on 10/15/2013  FL2 transmitted to all facilities in geographic area requested by pt/family on  10/17/2013 FL2 transmitted to all facilities within larger geographic area on   Patient informed that his/her managed care company has contracts with or will negotiate with  certain facilities, including the following:     Patient/family informed of bed offers received:   Patient chooses bed at  Physician recommends and patient chooses bed at    Patient to be transferred to  on   Patient to be transferred to facility by  Patient and family notified of transfer on  Name of family member notified:    The following physician request were entered in Epic:   Additional Comments: Eduard Clos, MSW, [place]

## 2013-10-17 NOTE — Consult Note (Signed)
Patient [name]      DOB: 10-24-1940      GEX:[id number]     Consult Note from the Palliative Medicine Team at [facility] Requested by: Dr Cruzita Lederer     PCP: [name], MD Reason for Consultation:  Clarification of [place] and options     Phone Number:[phone]  Assessment of patients Current state:  Continued physical, functional and cognitive decline over the past six months.  Family faced with advanced directive and anticipatory care needs  Consult is for review of medical treatment options, clarification of goals of care and end of life issues, disposition and options, and symptom recommendation.  This NP Wadie Lessen reviewed medical records, received report from team, assessed the patient and then meet at the patient's bedside along with her two nieces/HPOAs ([place]) to discuss diagnosis prognosis, [place], EOL wishes disposition and options.  A detailed discussion was had today regarding advanced directives.  Concepts specific to code status, artifical feeding and hydration, continued IV antibiotics and rehospitalization was had.  The difference between a aggressive medical intervention path  and a palliative comfort care path for this patient at this time was had.  Values and goals of care important to patient and family were attempted to be elicited.  Concept of Hospice and Palliative Care were discussed  Natural trajectory and expectations at EOL were discussed.  Questions and concerns addressed.  Hard Choices booklet left for review. Family encouraged to call with questions or concerns.  PMT will continue to support holistically.   Goals of Care: 1.  Code Status: DNR/DNI   2. Scope of Treatment:  Continue present medical interventions to treat the treatable, when medically stable discharge to SNF for rehabilitation with possible long term placement.  Son cannot take care of her at home, needs are too great.  3.  Disposition:  SNF for rehab,possible long term placement.  [facility] first choice then  [facility] then [facility]   4. Symptom Management:   1.  Weakness: medical management of treatable illness, PT/OT at SNF on discharge  5. Psychosocial:  Emotional support to nieces/HPOAs.  They struggle to make the important decisions regarding plan of care into the future.  The patient has a son who has his own psychosocial issues (which is believed to have impacted this patient's medical situation) and her husband lives in a facility with dementia.     Patient Documents Completed or Given: Document Given Completed  Advanced Directives Pkt    MOST yes   DNR    Gone from My Sight    Hard Choices yes     Brief HPI: 73 year old female with past medical history of diffuse B-cell lymphoma (under the care of Dr. [name]), on chemotherapy with Revlimid and Rituxan, history of atrial fibrillation on Coumadin, sick sinus syndrome, status post pacemaker placement, recent admission in May 2015 for acute on chronic kidney disease and right hydronephrosis who presented from skilled nursing facility with reports of altered mental status, lethargy for the past 24 hours prior to this admission.  Patient initially admitted to the hospitalist service with sepsis due to UTI, and soon after admission developed hypotension and septic shock requiring ICU care with vasopressors and stress dose steroids. She had a history of R ureteral stent placed on 4/3 which was supposed to be exchanged on 6/22. Her ICU course was complicated by renal failure with Cr rising as high as 3 as well as A fib  with RVR. Her blood and urine cultures were positive for Klebsiella without evidence of multi drug resistance. Her clinical condition slowly improved and Urology was able to take patient to the OR on 7/2 for stent exchange. Patient was transferred to the floor on 7/6   Discussion regarding plan of care and options with PMT today    ROS:   weakness   PMH:  Past Medical History  Diagnosis Date  . Sick sinus syndrome   . Hematoma     At the site of the pacemaker insertion.  . Hypotension   . Dyslipidemia   . History of atrial fibrillation   . Arthritis     Osteoarthritis  . Port-a-cath in place 12/08/10  . Status post chemotherapy completed 03/2011    R - CHOP q 3 weeks x 6  . S/P radiation therapy 05/28/2011 - 06/26/2011    Right Abdomen and Right Pelvis/3060 cGy in 17 Fractions  . Hypertension   . ASHD (arteriosclerotic heart disease)   . Gout   . COPD (chronic obstructive pulmonary disease)   . Asthma   . Hyperlipidemia   . Diabetes mellitus   . Hypothyroidism   . Diffuse large B cell lymphoma     Dr. Chisom,oncology  . Cancer 10/2010    large B cell lymphoma  . 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 Pacemaker   . Dysrhythmia     Hx. A. Fib, sick sinus syndrome- Pacemaker inserted.  . Cataracts, bilateral   . Sepsis     08/2013   . Urinary tract infection 08/2013, 10/06/13  . Pancytopenia due to chemotherapy   . Acute renal failure     CKD- Stage III  . Acute encephalopathy     hx of   . Neutropenia   . Enterococcus UTI 08/2013      PSH: Past Surgical History  Procedure Laterality Date  . Pacemaker insertion      Lead revision completed August 04, 2006- left chest G. Lovena Le  . Tubal ligation      Bilateral  . Insert / replace / remove pacemaker    . Abdominal hysterectomy    . Biopsy stomach  11/03/10    Soft Tissue Mass, Biopsy, Right Lower Quadrant Mesenteric Mass- High Grade Non-Hodgkins B Cell Lymphoma  with Flow Cytometry  . Bone biopsy  11/24/10    Bone Marrow, Aspirate Biospy, and clot, Left - No Involvement of Non-Hodgkin's Lymphoma Identified  . Portacath placement Right 06-23-13  . Cholecystectomy      laparoscopic  . Bladder suspension    . Ureteral stent placement  07/14/2013   . Cystoscopy with stent placement Right 10/11/2013    Procedure:  CYSTOSCOPY WITH URETERAL STENT EXCHANGE;  Surgeon: Alexis Frock, MD;  Location: [facility];  Service: Urology;  Laterality: Right;   I have reviewed the [place] and SH and  If appropriate update it with new information. Allergies  Allergen Reactions  . Codeine Nausea Only  . Penicillins Other (See Comments)    Unknown. Tolerated Zosyn 10/06/13-10/09/13   Scheduled Meds: . bisacodyl  5 mg Oral QHS  . cefTRIAXone (ROCEPHIN)  IV  1 g Intravenous Q24H  . dexamethasone  2 mg Oral Q12H  . diltiazem  180 mg Oral Daily  . insulin aspart  0-20 Units Subcutaneous TID WC  . insulin aspart  0-5 Units Subcutaneous QHS  . insulin glargine  24 Units Subcutaneous Daily  . levothyroxine  50 mcg Oral  QAC breakfast  . magnesium oxide  200 mg Oral Daily  . metoprolol tartrate  12.5 mg Oral BID  . sodium chloride  3 mL Intravenous Q12H  . tiotropium  18 mcg Inhalation Daily   Continuous Infusions: . lactated ringers    . phenylephrine (NEO-SYNEPHRINE) Adult infusion Stopped (10/12/13 0200)   PRN Meds:.acetaminophen, acetaminophen, albuterol, fentaNYL, fentaNYL, ipratropium, ondansetron (ZOFRAN) IV, ondansetron, URELLE    BP 117/62  Pulse 70  Temp(Src) 97.9 F (36.6 C) (Oral)  Resp 18  Ht 4\' 11"  (1.499 m)  Wt 97.9 kg (215 lb 13.3 oz)  BMI 43.57 kg/m2  SpO2 100%   PPS:30 % at best   Intake/Output Summary (Last 24 hours) at 10/17/13 1354 Last data filed at 10/17/13 1345  Gross per 24 hour  Intake    300 ml  Output   3350 ml  Net  -3050 ml    Physical Exam:  General: chronically ill appearing, NAD, oriented to person and place HEENT:  Moist buccal membranes, no exudate Chest:   CTA CVS: RRR Abdomen:obese, soft NT +BS Ext: without edema Neuro: continued cognitive decline, moves all four extrem  Labs: CBC    Component Value Date/Time   WBC 6.3 10/17/2013 0450   WBC 9.4 09/29/2013 0853   RBC 2.87* 10/17/2013 0450   RBC 3.89 09/29/2013 0853   HGB 8.5* 10/17/2013 0450   HGB 11.1* 09/29/2013  0853   HCT 26.4* 10/17/2013 0450   HCT 35.2 09/29/2013 0853   PLT 41* 10/17/2013 0450   PLT 132* 09/29/2013 0853   MCV 92.0 10/17/2013 0450   MCV 90.5 09/29/2013 0853   MCH 29.6 10/17/2013 0450   MCH 28.5 09/29/2013 0853   MCHC 32.2 10/17/2013 0450   MCHC 31.5 09/29/2013 0853   RDW 20.4* 10/17/2013 0450   RDW 20.3* 09/29/2013 0853   LYMPHSABS 0.4* 10/14/2013 0815   LYMPHSABS 0.6* 09/29/2013 0853   MONOABS 0.2 10/14/2013 0815   MONOABS 0.7 09/29/2013 0853   EOSABS 0.0 10/14/2013 0815   EOSABS 0.0 09/29/2013 0853   BASOSABS 0.0 10/14/2013 0815   BASOSABS 0.0 09/29/2013 0853    BMET    Component Value Date/Time   NA 141 10/17/2013 0450   NA 131* 09/29/2013 0853   NA 141 10/12/2011 0957   K 4.1 10/17/2013 0450   K 4.7 09/29/2013 0853   K 4.4 10/12/2011 0957   CL 101 10/17/2013 0450   CL 102 09/21/2012 0917   CL 95* 10/12/2011 0957   CO2 26 10/17/2013 0450   CO2 25 09/29/2013 0853   CO2 29 10/12/2011 0957   GLUCOSE 173* 10/17/2013 0450   GLUCOSE 445* 09/29/2013 0853   GLUCOSE 161* 09/21/2012 0917   GLUCOSE 131* 10/12/2011 0957   BUN 47* 10/17/2013 0450   BUN 47.1* 09/29/2013 0853   BUN 15 10/12/2011 0957   CREATININE 1.46* 10/17/2013 0450   CREATININE 1.7* 09/29/2013 0853   CREATININE 1.10 04/19/2013 1420   CALCIUM 7.3* 10/17/2013 0450   CALCIUM 9.3 09/29/2013 0853   CALCIUM 9.1 10/12/2011 0957   GFRNONAA 35* 10/17/2013 0450   GFRNONAA 50* 04/19/2013 1420   GFRAA 40* 10/17/2013 0450   GFRAA 58* 04/19/2013 1420    CMP     Component Value Date/Time   NA 141 10/17/2013 0450   NA 131* 09/29/2013 0853   NA 141 10/12/2011 0957   K 4.1 10/17/2013 0450   K 4.7 09/29/2013 0853   K 4.4 10/12/2011 0957   CL 101 10/17/2013 0450  CL 102 09/21/2012 0917   CL 95* 10/12/2011 0957   CO2 26 10/17/2013 0450   CO2 25 09/29/2013 0853   CO2 29 10/12/2011 0957   GLUCOSE 173* 10/17/2013 0450   GLUCOSE 445* 09/29/2013 0853   GLUCOSE 161* 09/21/2012 0917   GLUCOSE 131* 10/12/2011 0957   BUN 47* 10/17/2013 0450   BUN 47.1* 09/29/2013 0853   BUN 15 10/12/2011 0957   CREATININE  1.46* 10/17/2013 0450   CREATININE 1.7* 09/29/2013 0853   CREATININE 1.10 04/19/2013 1420   CALCIUM 7.3* 10/17/2013 0450   CALCIUM 9.3 09/29/2013 0853   CALCIUM 9.1 10/12/2011 0957   PROT 5.0* 10/16/2013 0422   PROT 5.9* 09/29/2013 0853   PROT 7.2 10/12/2011 0957   ALBUMIN 2.2* 10/16/2013 0422   ALBUMIN 3.2* 09/29/2013 0853   AST 11 10/16/2013 0422   AST 16 09/29/2013 0853   AST 19 10/12/2011 0957   ALT 27 10/16/2013 0422   ALT 32 09/29/2013 0853   ALT 20 10/12/2011 0957   ALKPHOS 82 10/16/2013 0422   ALKPHOS 79 09/29/2013 0853   ALKPHOS 65 10/12/2011 0957   BILITOT <0.2* 10/16/2013 0422   BILITOT 0.39 09/29/2013 0853   BILITOT 0.60 10/12/2011 0957   GFRNONAA 35* 10/17/2013 0450   GFRNONAA 50* 04/19/2013 1420   GFRAA 40* 10/17/2013 0450   GFRAA 58* 04/19/2013 1420     Time In Time Out Total Time Spent with Patient Total Overall Time  1000 1130 80 min 90 min    Greater than 50%  of this time was spent counseling and coordinating care related to the above assessment and plan.   Wadie Lessen NP  Palliative Medicine Team Team Phone # [phone] Pager [phone]  Discussed with Dr Cruzita Lederer

## 2013-10-17 NOTE — Clinical Social Work Note (Signed)
Family (twin neices, Tamela Oddi and [name]) are interested in seeking SNF placement at [facility], [facility] or [facility] (where she was). Patient also agreeable to this- however patient states "I like [facility]". Her bed was not held so we will send FL2 out to seek other possible options and advise.  Eduard Clos, MSW, [place]

## 2013-10-17 NOTE — Progress Notes (Addendum)
PROGRESS NOTE  [name] HEN:[id number] DOB: 1941/01/27 DOA: 10/06/2013 PCP: [name], MD  HPI: 73 year old female with past medical history of diffuse B-cell lymphoma (under the care of Dr. [name]), on chemotherapy with Revlimid and Rituxan, history of atrial fibrillation on Coumadin, sick sinus syndrome, status post pacemaker placement, recent admission in May 2015 for acute on chronic kidney disease and right hydronephrosis who presented from skilled nursing facility with reports of altered mental status, lethargy for the past 24 hours prior to this admission.   Interval course: Patient initially admitted to the hospitalist service with sepsis due to UTI, and soon after admission developed hypotension and septic shock requiring ICU care with vasopressors and stress dose steroids. She had a history of R ureteral stent placed on 4/3 which was supposed to be exchanged on 6/22. Her ICU course was complicated by renal failure with Cr rising as high as 3 as well as A fib with RVR. Her blood and urine cultures were positive for Klebsiella without evidence of multi drug resistance. Her clinical condition slowly improved and Urology was able to take patient to the OR on 7/2 for stent exchange. Patient was transferred to the floor on 7/6 and given multiple medical conditions as well as advanced lymphoma palliative care has been consulted.   Assessment/Plan:  Relapsed, Refractory high-grade Non-Hodgkin's B-Cell Lymphoma - chemotherapy on hold - palliative care consulted 7/6 - She had 4th line therapy. Dr. 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 [name] due to thrombocytopenia.  SSS s/p pacemaker COPD - stable, no wheezing  - on room air DM - adjust her regimen as needed - continue SSI. Hypothyroidism - continue Synthroid Acute encephalopathy - due to multiple active problems, stable   SIGNIFICANT EVENTS:  10/2010 - Dx with NHL  04/2011 - Chemo R CHOP + XRT with good response  10/2012 - Recurrence, Rx with salvage chemo  05/2013 - Recurrence, CT A/P >> increased LAN with severe R hydro  06/2013 - Admission for sepsis, pyelo  07/14/13 - R ureteral stent placement  5/20-5/28 - Admission for Enterococcus UTI / Sepsis. AKI/CKD- multi-factorial with R-sided hydro, nephrotoxic agents, urosepsis, hypercalcemia, and volume depletion related to diarrhea as well as nephrotoxic agents. Improved with IVF's. Creatinine as high as 7.6 down to 2.58 on d/c. She underwent renal ultrasound which showed right stent in place, no hydronephrosis on the right or the left.  ......................................................................................................................................................  6/26 - Readmit with c/o fever, increased swelling, periods of hypoglycemia. UTI +, Sepsis  6/29 - Resolved sepsis, off pressors. Significant thrombocytopenia  7/1 - ureteral stent change, transiently needed pressors   STUDIES:  6/26 renal US >> no hydronephrosis, stent shadow  LINES / TUBES:  R CW Port >>>  CULTURES: Hx Enterococcus UTI 5/20>> sens Vanco, nitrofurantioin  BCx2 6/26 >>klebsiella>>sens zosyn  UC 6/26 >>klebsiella>>sens zosyn  ANTIBIOTICS:  Rocephin 6/26 >>>x1  Zosyn 6/26 >>>6/29  Vanco 6/26 >>> 6/28  Rocephin 6/29 >>  Diet: NPO Fluids: none DVT  Prophylaxis: SCD  Code Status: Partial Family Communication:  d/w niece Disposition Plan: remain hospitalized, transfer telemetry 7/5  Consultants:  PCCM  Urology  Oncology  HPI/Subjective: Feeling well, no complaints, confused  Objective: Filed Vitals:   10/16/13 1026 10/16/13 1459 10/16/13 2142 10/17/13 0546  BP:  109/71 102/55 105/56  Pulse:  68 69 69  Temp:  97.4 F (36.3 C) 97.5 F (36.4 C) 98.1 F (36.7 C)  TempSrc:  Oral Oral Oral  Resp:  18 20 16   Height:      Weight:    97.9 kg (215 lb 13.3 oz)  SpO2: 99% 100% 98% 99%    Intake/Output Summary (Last 24 hours) at 10/17/13 1052 Last data filed at 10/17/13 0847  Gross per 24 hour  Intake    290 ml  Output   2500 ml  Net  -2210 ml   Filed Weights   10/14/13 0500 10/16/13 0457 10/17/13 0546  Weight: 97.206 kg (214 lb 4.8 oz) 97.4 kg (214 lb 11.7 oz) 97.9 kg (215 lb 13.3 oz)   Exam:  General:  NAD  Cardiovascular: regular rate and rhythm, without MRG  Respiratory: good air movement, clear to auscultation throughout, no wheezing, ronchi or rales  Abdomen: soft, not tender to palpation, positive bowel sounds  MSK: no peripheral edema  Neuro: non focal  Data Reviewed: Basic Metabolic Panel:  Recent Labs Lab 10/14/13 0545 10/14/13 0815 10/15/13 0544 10/16/13 0422 10/17/13 0450  NA QUESTIONABLE RESULTS, RECOMMEND RECOLLECT TO VERIFY 143 143 137 141  K QUESTIONABLE RESULTS, RECOMMEND RECOLLECT TO VERIFY 3.0* 3.1* 4.6 4.1  CL QUESTIONABLE RESULTS, RECOMMEND RECOLLECT TO VERIFY 101 105 98 101  CO2 QUESTIONABLE RESULTS, RECOMMEND RECOLLECT TO VERIFY 28 24 26 26   GLUCOSE QUESTIONABLE RESULTS, RECOMMEND RECOLLECT TO VERIFY 199* 162* 198* 173*  BUN QUESTIONABLE RESULTS, RECOMMEND RECOLLECT TO VERIFY 57* 46* 49* 47*  CREATININE QUESTIONABLE RESULTS, RECOMMEND RECOLLECT TO VERIFY 1.75* 1.33* 1.61* 1.46*  CALCIUM QUESTIONABLE RESULTS, RECOMMEND RECOLLECT TO VERIFY 6.9* 6.0* 7.2* 7.3*   Liver Function Tests:  Recent Labs Lab 10/14/13 0545 10/14/13 0815  10/16/13 0422  AST 19 11 11   ALT 12 33 27  ALKPHOS 57 82 82  BILITOT <0.2* 0.2* <0.2*  PROT 6.0 5.2* 5.0*  ALBUMIN 1.7* 2.1* 2.2*   CBC:  Recent Labs Lab 10/12/13 0445 10/14/13 0545 10/14/13 0815 10/15/13 0544 10/16/13 0422 10/17/13 0450  WBC 4.3 QUESTIONABLE RESULTS, RECOMMEND RECOLLECT TO VERIFY 5.5 4.7 6.3 6.3  NEUTROABS 3.8 QUESTIONABLE RESULTS, RECOMMEND RECOLLECT TO VERIFY 4.9  --   --   --   HGB 7.8* QUESTIONABLE RESULTS, RECOMMEND RECOLLECT TO VERIFY 8.3* 7.4* 8.6* 8.5*  HCT 23.4* QUESTIONABLE RESULTS, RECOMMEND RECOLLECT TO VERIFY 25.4* 22.4* 26.0* 26.4*  MCV 89.0 QUESTIONABLE RESULTS, RECOMMEND RECOLLECT TO VERIFY 89.1 90.7 90.3 92.0  PLT 54* QUESTIONABLE RESULTS, RECOMMEND RECOLLECT TO VERIFY 46* 34* 46* 41*   Cardiac Enzymes: No results found for this basename: CKTOTAL, CKMB, CKMBINDEX, TROPONINI,  in the last 168 hours BNP (last 3 results)  Recent Labs  10/06/13 0657  PROBNP 8619.0*   CBG:  Recent Labs Lab 10/16/13 0744 10/16/13 1251 10/16/13 1631 10/16/13 2146 10/17/13 0804  GLUCAP 151* 243* 175* 137* 157*    No results found for this or any previous visit (from the past 240 hour(s)).   Studies: No results found.  Scheduled Meds: . bisacodyl  5 mg Oral QHS  . cefTRIAXone (ROCEPHIN)  IV  1 g Intravenous Q24H  . dexamethasone  2  mg Oral Q12H  . diltiazem  180 mg Oral Daily  . insulin aspart  0-20 Units Subcutaneous TID WC  . insulin aspart  0-5 Units Subcutaneous QHS  . insulin glargine  24 Units Subcutaneous Daily  . levothyroxine  50 mcg Oral QAC breakfast  . magnesium oxide  200 mg Oral Daily  . metoprolol tartrate  12.5 mg Oral BID  . sodium chloride  3 mL Intravenous Q12H  . tiotropium  18 mcg Inhalation Daily   Continuous Infusions: . lactated ringers    . phenylephrine (NEO-SYNEPHRINE) Adult infusion Stopped (10/12/13 0200)   Active Problems:   Cardiac pacemaker in situ   Diffuse large B cell lymphoma   Hypothyroidism   T2  NIDDM   Non-Hodgkin's lymphoma of abdomen   Antineoplastic chemotherapy induced pancytopenia   Acute renal failure   Anemia of chronic disease   COPD (chronic obstructive pulmonary disease)   Atrial fibrillation with RVR   Hydronephrosis, right   UTI (lower urinary tract infection)   CKD (chronic kidney disease) stage 3, GFR 30-59 ml/min   Acute encephalopathy   Sepsis   Severe sepsis with septic shock  Time spent: 25  This note has been created with Surveyor, quantity. Any transcriptional errors are unintentional.   Marzetta Board, MD [facility] Pager [phone]. If 7 PM - 7 AM, please contact night-coverage at [email or web address], password [facility] 10/17/2013, 10:52 AM  LOS: 11 days

## 2013-10-18 ENCOUNTER — Encounter: Payer: Self-pay | Admitting: *Deleted

## 2013-10-18 DIAGNOSIS — D638 Anemia in other chronic diseases classified elsewhere: Secondary | ICD-10-CM

## 2013-10-18 LAB — PROTIME-INR
INR: 1.13 (ref 0.00–1.49)
Prothrombin Time: 14.5 seconds (ref 11.6–15.2)

## 2013-10-18 LAB — GLUCOSE, CAPILLARY
GLUCOSE-CAPILLARY: 231 mg/dL — AB (ref 70–99)
Glucose-Capillary: 153 mg/dL — ABNORMAL HIGH (ref 70–99)

## 2013-10-18 MED ORDER — CIPROFLOXACIN HCL 500 MG PO TABS: 500.0000 mg | ORAL_TABLET | Freq: Two times a day (BID) | ORAL | Status: DC

## 2013-10-18 MED ORDER — INSULIN ASPART 100 UNIT/ML ~~LOC~~ SOLN: 0.0000 [IU] | Freq: Three times a day (TID) | SUBCUTANEOUS | Status: AC

## 2013-10-18 MED ORDER — INSULIN ASPART 100 UNIT/ML ~~LOC~~ SOLN: 0.0000 [IU] | Freq: Every day | SUBCUTANEOUS | Status: DC

## 2013-10-18 MED ORDER — IPRATROPIUM BROMIDE 0.02 % IN SOLN: 0.5000 mg | Freq: Four times a day (QID) | RESPIRATORY_TRACT | Status: AC | PRN

## 2013-10-18 MED ORDER — TRAMADOL HCL 50 MG PO TABS: 50.0000 mg | ORAL_TABLET | Freq: Four times a day (QID) | ORAL | Status: AC | PRN

## 2013-10-18 MED ORDER — SODIUM CHLORIDE 0.9 % IJ SOLN
10.0000 mL | INTRAMUSCULAR | Status: DC | PRN
  Administered 2013-10-18 (×2): 10 mL

## 2013-10-18 MED ORDER — HEPARIN SOD (PORK) LOCK FLUSH 100 UNIT/ML IV SOLN
500.0000 [IU] | INTRAVENOUS | Status: AC | PRN
  Administered 2013-10-18: 500 [IU]

## 2013-10-18 MED ORDER — TIOTROPIUM BROMIDE MONOHYDRATE 18 MCG IN CAPS: 18.0000 ug | ORAL_CAPSULE | Freq: Every day | RESPIRATORY_TRACT | Status: AC

## 2013-10-18 MED ORDER — METOPROLOL TARTRATE 12.5 MG HALF TABLET: 12.5000 mg | ORAL_TABLET | Freq: Two times a day (BID) | ORAL | Status: AC

## 2013-10-18 MED ORDER — ACETAMINOPHEN 325 MG PO TABS: 650.0000 mg | ORAL_TABLET | Freq: Four times a day (QID) | ORAL | Status: AC | PRN

## 2013-10-18 MED ORDER — INSULIN GLARGINE 100 UNIT/ML ~~LOC~~ SOLN: 24.0000 [IU] | Freq: Every day | SUBCUTANEOUS | Status: AC

## 2013-10-18 NOTE — Progress Notes (Signed)
CSW received notification from [facility] that pt approved for SNF placement.   CSW contacted [facility] and confirmed pt family has completed re-admission paperwork.  CSW facilitated pt discharge needs including contacting facility, faxing pt discharge information via [facility], discussing with pt at bedside and pt sister, Doris via telephone, providing RN phone number to call report, and arranging ambulance transport for pt to [facility] via [facility].   Pt was pleased to learn that she was returning to [facility]. Pt pleasantly confused when CSW discussed. Pt sister currently at [facility] and aware that pt transport has been arranged.   No further social work needs identified at this time.  CSW signing off.   Alison [name], MSW, [place] Work [phone]

## 2013-10-18 NOTE — Discharge Summary (Signed)
Physician Discharge Summary  [name] VFI:[id number] DOB: 1940/04/15 DOA: 10/06/2013  PCP: [name], MD  Admit date: 10/06/2013 Discharge date: 10/18/2013  Recommendations for Outpatient Follow-up:  1. Please check kidney function in 2-3 days to make sure it is stable 2. Please note Coumadin on hold due to thrombocytopenia 3. Please have palliative services continue to follow in SNF   Discharge Diagnoses:  Active Problems:   Cardiac pacemaker in situ   Diffuse large B cell lymphoma   Hypothyroidism   T2 NIDDM   Non-Hodgkin's lymphoma of abdomen   Antineoplastic chemotherapy induced pancytopenia   Acute renal failure   Anemia of chronic disease   COPD (chronic obstructive pulmonary disease)   Atrial fibrillation with RVR   Hydronephrosis, right   UTI (lower urinary tract infection)   CKD (chronic kidney disease) stage 3, GFR 30-59 ml/min   Acute encephalopathy   Sepsis   Severe sepsis with septic shock   DNR (do not resuscitate) discussion   Palliative care encounter   Weakness generalized    Discharge Condition: stable   Diet recommendation: as tolerated   History of present illness:  73 year old female with past medical history of diffuse B-cell lymphoma (under the care of Dr. [name]), on chemotherapy with Revlimid and Rituxan, history of atrial fibrillation was on Coumadin but this is on hold during this hospitalization due to thrombocytopenia, sick sinus syndrome, status post pacemaker placement, recent admission in May 2015 for acute on chronic kidney disease and right hydronephrosis who presented from skilled nursing facility with reports of altered mental status, lethargy for the past 24 hours prior to this admission.   Interval course:  Patient initially admitted to the hospitalist service with sepsis due to UTI and soon after admission developed hypotension and septic shock requiring ICU care with vasopressors and stress dose steroids. She had a  history of R ureteral stent placed on 4/3 which was supposed to be exchanged on 6/22. Her ICU course was complicated by renal failure with Cr rising as high as 3 as well as A fib with RVR. Her blood and urine cultures were positive for Klebsiella without evidence of multi drug resistance. Her clinical condition slowly improved and Urology was able to take patient to the OR on 7/2 for stent exchange. Patient was transferred to the floor on 7/6 and given multiple medical conditions as well as advanced lymphoma palliative care has been consulted.   Assessment/Plan:   Principal Problem: Relapsed, Refractory high-grade Non-Hodgkin's B-Cell Lymphoma  - Chemotherapy on hold  - Continue decadron twice a day  - Palliative care consulted 7/6  - She had 4th line therapy. Dr. [name] following  Active Problems:  Septic shock in the setting of urinary tract infection / Klebsiella UTI with bacteremia  - off pressors since 7/1 doing well, off steroids - will transition to PO cipro for 7 days on discharge  Acute on chronic renal failure / CKD stage 3 - renal function stable  Hypokalemia  - replete as needed  Hypocalcemia  Anemia/Thrombocytopenia  - slight worsening, no bleeding, continue to monitor R hydronephrosis  - s/p R ureteral stent placement 4/3 exchanged 7/2  - appreciate urology and their recommendations  A fib with RVR  - continue Diltiazem  - hold Coumadin per Dr. [name] due to thrombocytopenia.  SSS s/p pacemaker  COPD  - stable, no wheezing  - on room air  DM  - continue SSI.  - continue lantus 24 units daily  Hypothyroidism  -  continue Synthroid  Hypertension - continue metoprolol  Acute encephalopathy  - due to multiple active problems, stable    SIGNIFICANT EVENTS:  10/2010 - Dx with NHL  04/2011 - Chemo R CHOP + XRT with good response  10/2012 - Recurrence, Rx with salvage chemo  05/2013 - Recurrence, CT A/P >> increased LAN with severe R hydro  06/2013 - Admission for  sepsis, pyelo  07/14/13 - R ureteral stent placement  5/20-5/28 - Admission for Enterococcus UTI / Sepsis. AKI/CKD- multi-factorial with R-sided hydro, nephrotoxic agents, urosepsis, hypercalcemia, and volume depletion related to diarrhea as well as nephrotoxic agents. Improved with IVF's. Creatinine as high as 7.6 down to 2.58 on d/c. She underwent renal ultrasound which showed right stent in place, no hydronephrosis on the right or the left.  ......................................................................................................................................................  6/26 - Readmit with c/o fever, increased swelling, periods of hypoglycemia. UTI +, Sepsis  6/29 - Resolved sepsis, off pressors. Significant thrombocytopenia  7/1 - ureteral stent change, transiently needed pressors    STUDIES:  6/26 renal US >> no hydronephrosis, stent shadow  LINES / TUBES:  R CW Port >>>  CULTURES: BCx2 6/26 >>klebsiella>>sens zosyn and cipro UC 6/26 >>klebsiella>>sens zosyn and cipro  ANTIBIOTICS:  Rocephin 6/26 >>x1  Zosyn 6/26 >> 6/29  Vanco 6/26 >> 6/28  Rocephin 6/29 >>   DVT Prophylaxis: SCD's  while patient is in hospital  Code Status: DNR/DNI Family Communication: Family not at the bedside this morning  Consultants:  PCCM  Urology  Oncology  Signed:  [name], MD  [facility] 10/18/2013, 10:48 AM  Pager #: [phone]    Discharge Exam: Filed Vitals:   10/18/13 0603  BP: 138/65  Pulse: 70  Temp: 98.1 F (36.7 C)  Resp: 12   Filed Vitals:   10/17/13 0546 10/17/13 1337 10/17/13 2130 10/18/13 0603  BP: 105/56 117/62 123/63 138/65  Pulse: 69 70 70 70  Temp: 98.1 F (36.7 C) 97.9 F (36.6 C) 98 F (36.7 C) 98.1 F (36.7 C)  TempSrc: Oral Oral Oral Oral  Resp: 16 18 16 12   Height:      Weight: 97.9 kg (215 lb 13.3 oz)   96 kg (211 lb 10.3 oz)  SpO2: 99% 100% 99% 100%    General: Pt is alert, not in acute distress Cardiovascular:  Regular rate and rhythm, S1/S2 appreciated  Respiratory: Clear to auscultation bilaterally, no rhonchi Abdominal: Soft, non tender, non distended, bowel sounds +, no guarding Extremities: no edema, pulses palpable bilaterally DP and PT Neuro: Grossly nonfocal  Discharge Instructions     Medication List    STOP taking these medications       allopurinol 100 MG tablet  Commonly known as:  ZYLOPRIM     calcitonin (salmon) 200 UNIT/ACT nasal spray  Commonly known as:  MIACALCIN     citalopram 20 MG tablet  Commonly known as:  CELEXA     furosemide 20 MG tablet  Commonly known as:  LASIX     polyethylene glycol packet  Commonly known as:  MIRALAX / GLYCOLAX     pravastatin 40 MG tablet  Commonly known as:  PRAVACHOL     URELLE 81 MG Tabs tablet     warfarin 2 MG tablet  Commonly known as:  COUMADIN      TAKE these medications       acetaminophen 325 MG tablet  Commonly known as:  TYLENOL  Take 2 tablets (650 mg total) by mouth every 6 (six) hours  as needed for mild pain (or Fever >/= 101).     albuterol (2.5 MG/3ML) 0.083% nebulizer solution  Commonly known as:  PROVENTIL  Take 3 mLs (2.5 mg total) by nebulization every 6 (six) hours as needed for shortness of breath.     bisacodyl 5 MG EC tablet  Commonly known as:  DULCOLAX  Take 5 mg by mouth at bedtime.     ciprofloxacin 500 MG tablet  Commonly known as:  CIPRO  Take 1 tablet (500 mg total) by mouth 2 (two) times daily.     dexamethasone 2 MG tablet  Commonly known as:  DECADRON  Take 1 tablet (2 mg total) by mouth 2 (two) times daily with a meal.     diltiazem 180 MG 24 hr capsule  Commonly known as:  DILACOR XR  Take 180 mg by mouth daily.     insulin aspart 100 UNIT/ML injection  Commonly known as:  novoLOG  Inject 0-20 Units into the skin 3 (three) times daily with meals.     insulin aspart 100 UNIT/ML injection  Commonly known as:  novoLOG  Inject 0-5 Units into the skin at bedtime.      insulin glargine 100 UNIT/ML injection  Commonly known as:  LANTUS  Inject 0.24 mLs (24 Units total) into the skin daily.     ipratropium 0.02 % nebulizer solution  Commonly known as:  ATROVENT  Take 2.5 mLs (0.5 mg total) by nebulization every 6 (six) hours as needed for wheezing or shortness of breath.     levothyroxine 50 MCG tablet  Commonly known as:  SYNTHROID, LEVOTHROID  Take 50 mcg by mouth daily before breakfast.     lidocaine-prilocaine cream  Commonly known as:  EMLA  Apply 1 application topically as needed. Apply to port a cath site one hour before needle stick as needed.     magnesium oxide 400 (241.3 MG) MG tablet  Commonly known as:  MAG-OX  Take 0.5 tablets (200 mg total) by mouth daily.     metoprolol tartrate 12.5 mg Tabs tablet  Commonly known as:  LOPRESSOR  Take 0.5 tablets (12.5 mg total) by mouth 2 (two) times daily.     ondansetron 8 MG tablet  Commonly known as:  ZOFRAN  Take 8 mg by mouth every 12 (twelve) hours as needed for nausea or vomiting.     tiotropium 18 MCG inhalation capsule  Commonly known as:  SPIRIVA  Place 1 capsule (18 mcg total) into inhaler and inhale daily.     traMADol 50 MG tablet  Commonly known as:  ULTRAM  Take 1 tablet (50 mg total) by mouth every 6 (six) hours as needed.            Follow-up Information   Follow up with Festus Aloe, MD In 3 months.   Specialty:  Urology   Contact information:   [address] [place] [address] [phone]       Follow up with MCKEOWN,WILLIAM DAVID, MD. Schedule an appointment as soon as possible for a visit in 2 weeks. (Follow up appt after recent hospitalization, If symptoms worsen)    Specialty:  Internal Medicine   Contact information:   [address] [place] [address] [phone]        The results of significant diagnostics from this hospitalization (including imaging, microbiology, ancillary and laboratory) are listed below for  reference.    Significant Diagnostic Studies: Dg Abd 1 View  10/11/2013  CLINICAL DATA:  Stent exchange  EXAM: DG C-ARM 1-60 MIN - NRPT [facility]; ABDOMEN - 1 VIEW  COMPARISON:  10/06/2013  FINDINGS: Three spot fluoro graphic images were obtained during right ureteral stent exchange. The gray intrarenal collecting system is opacified mildly dilated. The ureteral stent is seen extending from the pelvis to the bladder. Surgical clips are upper quadrant reflect prior cholecystectomy. Soft tissues are otherwise unremarkable.  IMPRESSION: Portable fluoroscopic images obtained during right ureteral stent replacement. Please refer to the procedure report for a complete description.   Electronically Signed   By: Lajean Manes M.D.   On: 10/11/2013 17:05   Dg Abd 1 View  10/06/2013   CLINICAL DATA:  Chronic renal failure. Right ureteral stent. History of lymphoma.  EXAM: ABDOMEN - 1 VIEW  COMPARISON:  CT 06/05/2013.  FINDINGS: Soft tissue structures are unremarkable. Gas pattern nonspecific. Double-J right ureteral stent is noted in good anatomic position. Catheter with tip projected over bladder noted. Degenerative changes lumbar spine and both hips.  IMPRESSION: 1. No acute abnormality. 2. Double-J right ureteral stent in good anatomic position.   Electronically Signed   By: Marcello Moores  Register   On: 10/06/2013 12:37   US Renal  10/06/2013   CLINICAL DATA:  Evaluate for hydronephrosis. History of right stent placement and stones.  EXAM: RENAL/URINARY TRACT ULTRASOUND COMPLETE  COMPARISON:  09/07/2013  FINDINGS: Right Kidney:  Length: 9.0 cm. Shadowing echogenic foci measuring up to 9 mm in length were noted in the interpolar region. No mass or hydronephrosis visualized.  Left Kidney:  Length: 12.2 cm. Echogenicity within normal limits. No mass or hydronephrosis visualized.  Bladder:  Foley catheter noted.  IMPRESSION: 1. No hydronephrosis. 2. Shadowing echogenic foci in the right kidney may represent nonobstructing  stones.   Electronically Signed   By: Logan Bores   On: 10/06/2013 16:02   Dg Chest Port 1 View  10/06/2013   CLINICAL DATA:  Fever  EXAM: PORTABLE CHEST - 1 VIEW  COMPARISON:  09/07/2013  FINDINGS: Do lead pacemaker appears unchanged. Right-sided power port has its tip in the SVC 2 cm above the right atrium, unchanged. Heart size and mediastinal shadows are normal. The lungs remain clear. No effusions.  IMPRESSION: No active disease.   Electronically Signed   By: Nelson Chimes M.D.   On: 10/06/2013 07:28   Dg C-arm 1-60 Min-no Report  10/11/2013   CLINICAL DATA:  Stent exchange  EXAM: DG C-ARM 1-60 MIN - NRPT [facility]; ABDOMEN - 1 VIEW  COMPARISON:  10/06/2013  FINDINGS: Three spot fluoro graphic images were obtained during right ureteral stent exchange. The gray intrarenal collecting system is opacified mildly dilated. The ureteral stent is seen extending from the pelvis to the bladder. Surgical clips are upper quadrant reflect prior cholecystectomy. Soft tissues are otherwise unremarkable.  IMPRESSION: Portable fluoroscopic images obtained during right ureteral stent replacement. Please refer to the procedure report for a complete description.   Electronically Signed   By: Lajean Manes M.D.   On: 10/11/2013 17:05    Microbiology: No results found for this or any previous visit (from the past 240 hour(s)).   Labs: Basic Metabolic Panel:  Recent Labs Lab 10/14/13 0815 10/15/13 0544 10/16/13 0422 10/17/13 0450  NA 143 143 137 141  K 3.0* 3.1* 4.6 4.1  CL 101 105 98 101  CO2 28 24 26 26   GLUCOSE 199* 162* 198* 173*  BUN 57* 46* 49* 47*  CREATININE 1.75* 1.33* 1.61* 1.46*  CALCIUM  6.9* 6.0* 7.2* 7.3*   Liver Function Tests:  Recent Labs Lab 10/14/13 0545 10/14/13 0815 10/16/13 0422  AST 19 11 11   ALT 12 33 27  ALKPHOS 57 82 82  BILITOT <0.2* 0.2* <0.2*  PROT 6.0 5.2* 5.0*  ALBUMIN 1.7* 2.1* 2.2*   No results found for this basename: LIPASE, AMYLASE,  in the last 168 hours No  results found for this basename: AMMONIA,  in the last 168 hours CBC:  Recent Labs Lab 10/12/13 0445 10/14/13 0815 10/15/13 0544 10/16/13 0422 10/17/13 0450  WBC 4.3 5.5 4.7 6.3 6.3  NEUTROABS 3.8 4.9  --   --   --   HGB 7.8* 8.3* 7.4* 8.6* 8.5*  HCT 23.4* 25.4* 22.4* 26.0* 26.4*  MCV 89.0 89.1 90.7 90.3 92.0  PLT 54* 46* 34* 46* 41*   Cardiac Enzymes: No results found for this basename: CKTOTAL, CKMB, CKMBINDEX, TROPONINI,  in the last 168 hours BNP: BNP (last 3 results)  Recent Labs  10/06/13 0657  PROBNP 8619.0*   CBG:  Recent Labs Lab 10/17/13 0804 10/17/13 1203 10/17/13 1647 10/17/13 2129 10/18/13 0722  GLUCAP 157* 256* 243* 260* 153*    Time coordinating discharge: Over 30 minutes

## 2013-10-18 NOTE — Progress Notes (Signed)
CSW following for disposition planning.  CSW received notification from MD that pt medically stable for discharge today.   CSW reviewed chart and noted that pt admitted from [facility] and was hopeful for [facility] or [facility] upon discharge.   CSW reviewed bed offers and noted that [facility] and [facility] were unable to offer a bed.   CSW met with pt at bedside to discuss. Pt states that she is content with returning to [facility] and likes the facility. Pt agreeable to return to [facility] today and agreeable to CSW contacting pt niece to discuss.  CSW contacted pt niece, Tamela Oddi to discuss. CSW discussed with pt niece that pt medically ready for discharge today and reviewed SNF bed offers with pt niece. Pt niece discussed that if  [facility] or [facility] are not able to offer a bed then she is also agreeable to return to [facility].   CSW contacted [facility] and confirmed availability for today.  CSW faxed pt clinicals to [facility] and will assist with discharge planning to [facility] when insurance authorization received.   Alison [name], MSW, [employer] [employer] [phone]

## 2013-10-18 NOTE — Discharge Instructions (Signed)
Ureteral Stent Implantation, Care After °Refer to this sheet in the next few weeks. These instructions provide you with information on caring for yourself after your procedure. Your health care provider may also give you more specific instructions. Your treatment has been planned according to current medical practices, but problems sometimes occur. Call your health care provider if you have any problems or questions after your procedure. °WHAT TO EXPECT AFTER THE PROCEDURE °You should be back to normal activity within 48 hours after the procedure. Nausea and vomiting may occur and are commonly the result of anesthesia. °It is common to experience sharp pain in the back or lower abdomen and penis with voiding. This is caused by movement of the ends of the stent with the act of urinating. It usually goes away within minutes after you have stopped urinating. °HOME CARE INSTRUCTIONS °Make sure to drink plenty of fluids. You may have small amounts of bleeding, causing your urine to be red. This is normal. Certain movements may trigger pain or a feeling that you need to urinate. You may be given medicines to prevent infection or bladder spasms. Be sure to take all medicines as directed. Only take over-the-counter or prescription medicines for pain, discomfort, or fever as directed by your health care provider. Do not take aspirin, as this can make bleeding worse. °Your stent will be left in until the blockage is resolved. This may take 2 weeks or longer, depending on the reason for stent implantation. You may have an X-ray exam to make sure your ureter is open and that the stent has not moved out of position (migrated). The stent can be removed by your health care provider in the office. Medicines may be given for comfort while the stent is being removed. Be sure to keep all follow-up appointments so your health care provider can check that you are healing properly. °SEEK MEDICAL CARE IF: °· You experience increasing  pain. °· Your pain medicine is not working. °SEEK IMMEDIATE MEDICAL CARE IF: °· Your urine is dark red or has blood clots. °· You are leaking urine (incontinent). °· You have a fever, chills, feeling sick to your stomach (nausea), or vomiting. °· Your pain is not relieved by pain medicine. °· The end of the stent comes out of the urethra. °· You are unable to urinate. °Document Released: 11/30/2012 Document Revised: 04/04/2013 Document Reviewed: 11/30/2012 °Exit[employer]® Patient Information ©2015 [employer]. This information is not intended to replace advice given to you by your health care provider. Make sure you discuss any questions you have with your health care provider. ° °

## 2013-10-20 ENCOUNTER — Other Ambulatory Visit: Payer: Self-pay | Admitting: Internal Medicine

## 2013-10-20 DIAGNOSIS — C8593 Non-Hodgkin lymphoma, unspecified, intra-abdominal lymph nodes: Secondary | ICD-10-CM

## 2013-10-24 ENCOUNTER — Encounter: Payer: Self-pay | Admitting: Internal Medicine

## 2013-10-25 ENCOUNTER — Telehealth: Payer: Self-pay | Admitting: Internal Medicine

## 2013-10-25 NOTE — Progress Notes (Signed)
After placed monitoring on patient in OR prior to induction discovered that her atrial fibrilation was very poorly controlled with heart rates as high as 160.  Elected to postpone the surgery until better heart rate control could be obtained by medical doctors taking care of her on the floor.     Aldric Wenzler MD

## 2013-10-25 NOTE — Telephone Encounter (Signed)
called pt home number listed in EPIC. s/w brian who states he is pt's caretaker and she is currently in a nursing home and they will bring her to appt. brian did not have number to the nursing home and asked that i call his girlfriend kim  at [phone] who was currently w/pt at the home and would convey appt to staff. s/w kim re appt 7/17 and per kim she will give appt to nursing staff.

## 2013-10-26 ENCOUNTER — Telehealth: Payer: Self-pay | Admitting: Internal Medicine

## 2013-10-26 NOTE — Telephone Encounter (Signed)
[name] cancelled r/s pt labs/ov due to transportation........KJ

## 2013-10-27 ENCOUNTER — Ambulatory Visit: Payer: Medicare Other

## 2013-10-27 ENCOUNTER — Other Ambulatory Visit: Payer: Medicare Other

## 2013-10-29 NOTE — Consult Note (Signed)
I have reviewed and discussed the care of this patient in detail with the nurse practitioner including pertinent patient records, physical exam findings and data. I agree with details of this encounter.  

## 2013-11-01 ENCOUNTER — Ambulatory Visit (HOSPITAL_BASED_OUTPATIENT_CLINIC_OR_DEPARTMENT_OTHER): Payer: Medicare Other | Admitting: Internal Medicine

## 2013-11-01 ENCOUNTER — Other Ambulatory Visit (HOSPITAL_BASED_OUTPATIENT_CLINIC_OR_DEPARTMENT_OTHER): Payer: Medicare Other

## 2013-11-01 VITALS — BP 127/107 | HR 82 | Temp 98.0°F | Resp 18 | Ht 59.0 in

## 2013-11-01 DIAGNOSIS — C8330 Diffuse large B-cell lymphoma, unspecified site: Secondary | ICD-10-CM

## 2013-11-01 DIAGNOSIS — N1330 Unspecified hydronephrosis: Secondary | ICD-10-CM

## 2013-11-01 DIAGNOSIS — R7309 Other abnormal glucose: Secondary | ICD-10-CM

## 2013-11-01 DIAGNOSIS — E039 Hypothyroidism, unspecified: Secondary | ICD-10-CM

## 2013-11-01 DIAGNOSIS — I4891 Unspecified atrial fibrillation: Secondary | ICD-10-CM

## 2013-11-01 DIAGNOSIS — C8583 Other specified types of non-Hodgkin lymphoma, intra-abdominal lymph nodes: Secondary | ICD-10-CM

## 2013-11-01 DIAGNOSIS — C8593 Non-Hodgkin lymphoma, unspecified, intra-abdominal lymph nodes: Secondary | ICD-10-CM

## 2013-11-01 DIAGNOSIS — T451X5A Adverse effect of antineoplastic and immunosuppressive drugs, initial encounter: Secondary | ICD-10-CM

## 2013-11-01 DIAGNOSIS — J441 Chronic obstructive pulmonary disease with (acute) exacerbation: Secondary | ICD-10-CM

## 2013-11-01 DIAGNOSIS — E119 Type 2 diabetes mellitus without complications: Secondary | ICD-10-CM

## 2013-11-01 DIAGNOSIS — I10 Essential (primary) hypertension: Secondary | ICD-10-CM

## 2013-11-01 DIAGNOSIS — D6481 Anemia due to antineoplastic chemotherapy: Secondary | ICD-10-CM

## 2013-11-01 DIAGNOSIS — D696 Thrombocytopenia, unspecified: Secondary | ICD-10-CM

## 2013-11-01 DIAGNOSIS — E8809 Other disorders of plasma-protein metabolism, not elsewhere classified: Secondary | ICD-10-CM

## 2013-11-01 LAB — CBC WITH DIFFERENTIAL/PLATELET
BASO%: 0.4 % (ref 0.0–2.0)
Basophils Absolute: 0 10*3/uL (ref 0.0–0.1)
EOS%: 0 % (ref 0.0–7.0)
Eosinophils Absolute: 0 10*3/uL (ref 0.0–0.5)
HEMATOCRIT: 27.7 % — AB (ref 34.8–46.6)
HGB: 8.9 g/dL — ABNORMAL LOW (ref 11.6–15.9)
LYMPH#: 1 10*3/uL (ref 0.9–3.3)
LYMPH%: 8.7 % — ABNORMAL LOW (ref 14.0–49.7)
MCH: 29 pg (ref 25.1–34.0)
MCHC: 32.1 g/dL (ref 31.5–36.0)
MCV: 90.2 fL (ref 79.5–101.0)
MONO#: 0.6 10*3/uL (ref 0.1–0.9)
MONO%: 5.9 % (ref 0.0–14.0)
NEUT#: 9.2 10*3/uL — ABNORMAL HIGH (ref 1.5–6.5)
NEUT%: 85 % — AB (ref 38.4–76.8)
Platelets: 42 10*3/uL — ABNORMAL LOW (ref 145–400)
RBC: 3.07 10*6/uL — ABNORMAL LOW (ref 3.70–5.45)
RDW: 19.9 % — ABNORMAL HIGH (ref 11.2–14.5)
WBC: 10.9 10*3/uL — ABNORMAL HIGH (ref 3.9–10.3)
nRBC: 1 % — ABNORMAL HIGH (ref 0–0)

## 2013-11-01 LAB — COMPREHENSIVE METABOLIC PANEL (CC13)
ALT: 49 U/L (ref 0–55)
ANION GAP: 16 meq/L — AB (ref 3–11)
AST: 28 U/L (ref 5–34)
Albumin: 2.5 g/dL — ABNORMAL LOW (ref 3.5–5.0)
Alkaline Phosphatase: 116 U/L (ref 40–150)
BUN: 59.3 mg/dL — AB (ref 7.0–26.0)
CALCIUM: 8.6 mg/dL (ref 8.4–10.4)
CO2: 23 mEq/L (ref 22–29)
CREATININE: 1.7 mg/dL — AB (ref 0.6–1.1)
Chloride: 100 mEq/L (ref 98–109)
Glucose: 323 mg/dl — ABNORMAL HIGH (ref 70–140)
Potassium: 3.7 mEq/L (ref 3.5–5.1)
Sodium: 138 mEq/L (ref 136–145)
TOTAL PROTEIN: 5.6 g/dL — AB (ref 6.4–8.3)
Total Bilirubin: 0.25 mg/dL (ref 0.20–1.20)

## 2013-11-01 LAB — PROTIME-INR
INR: 1 — ABNORMAL LOW (ref 2.00–3.50)
Protime: 12 Seconds (ref 10.6–13.4)

## 2013-11-01 LAB — LACTATE DEHYDROGENASE (CC13): LDH: 776 U/L — ABNORMAL HIGH (ref 125–245)

## 2013-11-01 LAB — TECHNOLOGIST REVIEW

## 2013-11-02 ENCOUNTER — Encounter: Payer: Self-pay | Admitting: Internal Medicine

## 2013-11-02 NOTE — Progress Notes (Signed)
[place], MD [address] [place] [address]  DIAGNOSIS: Non-Hodgkin's lymphoma of abdomen  Diffuse large B cell lymphoma - Plan: Ambulatory referral to Hospice  Hydronephrosis, right  Hypercalcemia  Chief Complaint  Patient presents with  . Diffuse large B cell lymphoma    PRIOR TREATMENT:  Started Revlimid plus rituxan as a 4th line salvage therapy on 08/09/2013 (Witzig TE, et al, Ann Oncol. 2011 Jul;22 (7):1622-7.  She received rituxan on 08/09/2013, 09/15/2013.  CURRENT TREATMENT: Referral to hospice made on 11/01/2013.      Non-Hodgkin's lymphoma of abdomen   09/23/2010 Imaging CT abdomen and pelvis: 5.5 x 6 cm irregular right abdominal mass with adjacent small lymph nodes likely representing neoplasm.  This does not appear to connect to hollow or solid viscera and may represent a sarcoma.    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    11/03/2010 Initial Diagnosis Non-Hodgkin's lymphoma of abdomen   11/24/2010 Bone Marrow Biopsy No involvment of lymphoma identified.    11/25/2010 Cancer Staging PET/CT: 1.  Interval enlargement of a hypermetabolic ileocolic mesenteric nodal mass. 2.  Hypermetabolic focus corresponding to an enlarging precaval node.  This is consistent with active lymphoma. 3.  No evidence of extra-abdominal disease.   12/08/2010 Procedure R port-a-catheter placment   12/11/2010 - 03/27/2011 Chemotherapy Q 3 week R-CHOP x 6 cyles   04/27/2011 Cancer Staging PET/CT:  1.  Complete response to chemotherapy with no hypermetabolic activity associated with the mesenteric mass or prevascular lymph nodes which are also reduced in size.   05/28/2011 - 06/26/2011 Radiation Therapy Consolidative XRT [name]). Right abdomen and right pelvis/ 3060 cGy in 17 fractions   10/12/2011 Imaging CT C/A/P: 1.  Interval increase in size of aortocaval lymph node measuring 12 mm compared to 6 mm on prior.  This is concerning for mild disease progression. 2.  Mesenteric mass in the ileocecal mesentery is decreased slightly in size.3.  Spleen is normal.   01/18/2012 Imaging CT C/A/P: 1.  Slight interval decrease in size of the mesenteric mass. 2.  Enlarging retroperitoneal lymph node. 3.  No new lymphadenopathy.   05/23/2012 Imaging CT of abdomen: 1.  Significant interval enlargement of a retroperitoneal LN  adjacent to the IVC and infrarenal abdominal aorta, which currently measures 3.9 x 2.9 cm.  Findings are compatible with progression of disease.  No new lymphadenopathy.    05/23/2012 Relapse/Recurrence She had relapsed disease.   06/29/2012 - 08/25/2012 Chemotherapy Completed 3 cycles of bendamustine/Rituxan on 06/29/2012.    08/25/2012 Progression Had disease progression while on Bendamustine/Rituxan   10/03/2012 Procedure Biopsy of R. Perotoneal mass   10/03/2012 Pathology Pathology consistent with high grade NH B-cell lymphoma   10/26/2012 - 03/14/2013 Chemotherapy She had salvage chemotherapy with R-GPD q 3 weeks.  Completed 5 cyles.    02/11/2013 - 02/24/2013 Hospital Admission Admitted secondary to febrile neutropenia.  Her infectious work-up was negative and her thrombocytopenia was prolonged.  She received antibiotics and plts transfusion.    03/27/2013 Imaging CT C/A/P on 12/15 was consistent with mild disease progression.    06/05/2013 Progression CT C/A/P consistent with moderate disease progression plus R hydronephosis (moderate,severe).  Planning R-ICE salvage chemotherapy followed by transplant [name], Dr. Cassell Clement). Referral to Urology   06/26/2013 - 07/03/2013 Hospital Admission Hospitalized with AMS, fevers and dysuria.  Treated  for urosepsis with broad spectrum antibiotics.  Perc nephrostomy placed on right kidney due to hydronephrosis.  Discharged to rehab.    07/14/2013  Procedure Had Perc nephrostomy removed with placement of right ureteral stent.     08/02/2013 - 09/15/2013 Chemotherapy Starting rituximab (first dose on 08/09/2013) and lenalidomide 15 mg every other day. (Witzig, TE et. al, Ann Oncol. 2011 Jul;22(7): 1622-7.    Received Ritux on 09/15/2013.  Revlimid held due to multiple hospitalizations.    08/30/2013 - 09/07/2013 Hospital Admission Admission for Enterococcus UTI / Sepsis. AKI/CKD- multi-factorial with R-sided hydro, nephrotoxic agents, urosepsis, hypercalcemia, and volume depletion related to diarrhea as well as nephrotoxic agents. Improved with IVF's. Creatinine as high as 7.6.   10/06/2013 - 10/18/2013 Hospital Admission Sepsis due to UTI and soon after admission developed hypotension and septic shock requiring ICU care with vasopressors and stress dose steroids. Her ICU course was complicated with Kleb (MDR) septicemia. Discharged to [facility].    11/01/2013 Treatment Plan Change Referral to hospice.  Patient has poor functional status, multiple ICU level admission within the past 6 months, mental status remains altered.  LDH, 776.    INTERVAL HISTORY: [name] 73 y.o. female returns for follow up visit. She was last seen by me on 09/15/2013. She continues to decline with episodes of confusion and altered mental status.  As noted above she has two recent hospitalizations in the ICU due to sepsis.  She is at [facility]. Today, she is accompanied by her nieces [name] and [name].   She denies dysuria or fevers or chills.  She does have "reddish spots" on her lower extremities and chest.     MEDICAL HISTORY: Past Medical History  Diagnosis Date  . Sick sinus syndrome   . Hematoma     At the site of the pacemaker insertion.  . Hypotension   . Dyslipidemia   . History of atrial fibrillation   . Arthritis     Osteoarthritis  . Port-a-cath in place 12/08/10  . Status post chemotherapy completed 03/2011    R - CHOP q 3 weeks x 6  . S/P  radiation therapy 05/28/2011 - 06/26/2011    Right Abdomen and Right Pelvis/3060 cGy in 17 Fractions  . Hypertension   . ASHD (arteriosclerotic heart disease)   . Gout   . COPD (chronic obstructive pulmonary disease)   . Asthma   . Hyperlipidemia   . Diabetes mellitus   . Hypothyroidism   . Diffuse large B cell lymphoma     Dr. Chisom,oncology  . Cancer 10/2010    large B cell lymphoma  . 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 Pacemaker   . Dysrhythmia     Hx. A. Fib, sick sinus syndrome- Pacemaker inserted.  . Cataracts, bilateral   . Sepsis     08/2013   . Urinary tract infection 08/2013, 10/06/13  . Pancytopenia due to chemotherapy   . Acute renal failure     CKD- Stage III  . Acute encephalopathy     hx of   . Neutropenia   . Enterococcus UTI 08/2013     INTERIM HISTORY: has Hypertension; Cardiac pacemaker in situ; Diffuse large B cell lymphoma; Hypothyroidism; T2 NIDDM; Hyperlipidemia; Non-Hodgkin's lymphoma of abdomen; Depression; Fever and neutropenia; Antineoplastic chemotherapy induced pancytopenia; Acute renal failure; Anemia of chronic disease; Chronic bronchitis; ASHD (arteriosclerotic heart disease); Gout; COPD (chronic obstructive pulmonary disease); Asthma; Atrial fibrillation with RVR; Hydronephrosis, right; UTI (lower  urinary tract infection); Severe sepsis; Sepsis secondary to UTI; Anticoagulation management encounter; Generalized weakness; Hypercalcemia; Acute on chronic renal failure; CKD (chronic kidney disease) stage 3, GFR 30-59 ml/min; Acute encephalopathy; Neutropenia; Sepsis; Severe sepsis with septic shock; DNR (do not resuscitate) discussion; Palliative care encounter; and Weakness generalized on her problem list.    ALLERGIES:  is allergic to codeine and penicillins.  MEDICATIONS: has a current medication list which includes the following prescription(s): acetaminophen, albuterol, bisacodyl, dexamethasone, diltiazem,  insulin aspart, insulin glargine, ipratropium, levothyroxine, lidocaine-prilocaine, magnesium oxide, metoprolol tartrate, ondansetron, sertraline, tiotropium, and tramadol.  SURGICAL HISTORY:  Past Surgical History  Procedure Laterality Date  . Pacemaker insertion      Lead revision completed August 04, 2006- left chest G. Lovena Le  . Tubal ligation      Bilateral  . Insert / replace / remove pacemaker    . Abdominal hysterectomy    . Biopsy stomach  11/03/10    Soft Tissue Mass, Biopsy, Right Lower Quadrant Mesenteric Mass- High Grade Non-Hodgkins B Cell Lymphoma  with Flow Cytometry  . Bone biopsy  11/24/10    Bone Marrow, Aspirate Biospy, and clot, Left - No Involvement of Non-Hodgkin's Lymphoma Identified  . Portacath placement Right 06-23-13  . Cholecystectomy      laparoscopic  . Bladder suspension    . Ureteral stent placement  07/14/2013   . Cystoscopy with stent placement Right 10/11/2013    Procedure: CYSTOSCOPY WITH URETERAL STENT EXCHANGE;  Surgeon: Alexis Frock, MD;  Location: [facility];  Service: Urology;  Laterality: Right;    REVIEW OF SYSTEMS:   Constitutional: Denies fevers, chills or abnormal weight loss Eyes: Denies blurriness of vision Ears, nose, mouth, throat, and face: Denies mucositis or sore throat Respiratory: Reports a chronic non-productive cough, but denies dyspnea or wheezes Cardiovascular: Denies palpitation, chest discomfort or lower extremity swelling Gastrointestinal:  Denies nausea, heartburn or change in bowel habits Skin: Denies abnormal skin rashes Lymphatics: Denies new lymphadenopathy or easy bruising Neurological:Denies numbness, tingling or new weaknesses Behavioral/Psych: Mood is stable, no new changes  All other systems were reviewed with the patient and are negative.  PHYSICAL EXAMINATION: ECOG PERFORMANCE STATUS: 1 - Symptomatic but completely ambulatory  Blood pressure 127/107, pulse 82, temperature 98 F (36.7 C), temperature source Oral,  resp. rate 18, height 4' 11" (1.499 m), weight 0 lb (0 kg), SpO2 99.00%.  GENERAL:alert, no distress and comfortable; Obese lady seating in her wheelchair, chronically ill appearing. AAOx2. Moon facies.  SKIN: skin color, texture, turgor are normal, no rashes or significant lesions; Port a cath on R. + Petechia rash on lower extremities and chest.  EYES: normal, Conjunctiva are pink and non-injected, sclera clear OROPHARYNX:no exudate, no erythema and lips, buccal mucosa, and tongue normal  NECK: supple, thyroid normal size, non-tender, without nodularity LYMPH:  no palpable lymphadenopathy in the cervical, axillary or supraclavicular LUNGS: clear to auscultation with normal breathing effort, no wheezes or rhonchi HEART: regular rate & rhythm and no murmurs and no lower extremity edema ABDOMEN:abdomen soft, non-tender and normal bowel sounds Musculoskeletal:no cyanosis of digits and no clubbing  NEURO: alert & oriented x 3 with fluent speech, no focal motor/sensory deficits  Labs:  Lab Results  Component Value Date   WBC 10.9* 11/01/2013   HGB 8.9* 11/01/2013   HCT 27.7* 11/01/2013   MCV 90.2 11/01/2013   PLT 42* 11/01/2013   NEUTROABS 9.2* 11/01/2013      Chemistry      Component Value Date/Time  NA 138 11/01/2013 1203   NA 141 10/17/2013 0450   NA 141 10/12/2011 0957   K 3.7 11/01/2013 1203   K 4.1 10/17/2013 0450   K 4.4 10/12/2011 0957   CL 101 10/17/2013 0450   CL 102 09/21/2012 0917   CL 95* 10/12/2011 0957   CO2 23 11/01/2013 1203   CO2 26 10/17/2013 0450   CO2 29 10/12/2011 0957   BUN 59.3* 11/01/2013 1203   BUN 47* 10/17/2013 0450   BUN 15 10/12/2011 0957   CREATININE 1.7* 11/01/2013 1203   CREATININE 1.46* 10/17/2013 0450   CREATININE 1.10 04/19/2013 1420      Component Value Date/Time   CALCIUM 8.6 11/01/2013 1203   CALCIUM 7.3* 10/17/2013 0450   CALCIUM 9.1 10/12/2011 0957   ALKPHOS 116 11/01/2013 1203   ALKPHOS 82 10/16/2013 0422   ALKPHOS 65 10/12/2011 0957   AST 28 11/01/2013 1203   AST 11  10/16/2013 0422   AST 19 10/12/2011 0957   ALT 49 11/01/2013 1203   ALT 27 10/16/2013 0422   ALT 20 10/12/2011 0957   BILITOT 0.25 11/01/2013 1203   BILITOT <0.2* 10/16/2013 0422   BILITOT 0.60 10/12/2011 0957       Basic Metabolic Panel:  Recent Labs Lab 11/01/13 1203  NA 138  K 3.7  CO2 23  GLUCOSE 323*  BUN 59.3*  CREATININE 1.7*  CALCIUM 8.6   GFR The CrCl is unknown because both a height and weight (above a minimum accepted value) are required for this calculation. Liver Function Tests:  Recent Labs Lab 11/01/13 1203  AST 28  ALT 49  ALKPHOS 116  BILITOT 0.25  PROT 5.6*  ALBUMIN 2.5*    CBC:  Recent Labs Lab 11/01/13 1203  WBC 10.9*  NEUTROABS 9.2*  HGB 8.9*  HCT 27.7*  MCV 90.2  PLT 42*    Studies:  No results found.   RADIOGRAPHIC STUDIES: None  ASSESSMENT: [name] 73 y.o. female with a history of Non-Hodgkin's lymphoma of abdomen  Diffuse large B cell lymphoma - Plan: Ambulatory referral to Hospice  Hydronephrosis, right  Hypercalcemia   PLAN:   1. Relapsed, refractory high grade non-hodgkin's B cell Lymphoma on salvage therapy, progressed on salvage R-GDP; Started rituxan plus revlimid as 4th line therapy, now likely progressing. .  -Previously, She completed 5 cycles of R+DGP q 21 days (modified doses based on dehydration, electrolyte abnormalities) and we reviewed the CT Scans of C/A/P (2/23) which was consistent with a further moderate increase in retroperitoneal lymphadenopathy complicated by right hydronephrosis. She now has a right ureteral stent in place.   --  We started a less aggressive 4-th line therapy with Rituxan plus revlimid based on Witzig, TE, et. Adrian Prince Oncol. 2011; Jul;22(7):1622-7.  Of the 108 patients with DLBCL 28% or 30 patients had an overall response rate.  Most common adverse events was myelosuppression with grade 4 neutropenia and thrombocytopenia in 17% and 6%, respectively.  They were given lenalidomide 25 mg  on days 1-21 every 28 days as tolerated or until progression.  Given her renal insufficiency, we started at 15 mg every other day.  In addition, we gave rituximab 375 mg/m2 every 28 days started on 08/09/2013.  She received a second dose on 09/15/2013.    --However, she has had multiple recent ICU-level admission with urosepsis complicated by AMS, acute renal failure, dehydration likely due to myelosuppression, immunosuppression due to her illness, and related to her chronic ureteral stent. Given  her current poor functional status and multiple co-morbidities, she is not likely to tolerate further chemotherapy. After a detailed discussion with the patient and her two nieces present, we recommended a referral for hospice evaluation be made.  We discussed hospice focuses on Quality of life (QoL) measures such as comfort and pain control.  We do not attempt to treat the lymphoma.  Ms. Brisby understood these recommendation and agreed to proceed with hospice referral.  She requested evaluation for inpatient hosp[facility] at the [facility].   2. S/P R. Hydronephrosis, s/p R ureteral stent (04/03) --Patient had R ureteral stent placed on 04/03.  She had it exchanged on 07/01 (Dr. Risa Grill) during her last hospitalization.   3. Anemia secondary to #1 or chemotherapy.  --Hgb improved to 8.9 today.   4. Chronic-steroid use. --Patient has elevated blood glucoses and moon facies on physical exam.  I recommend to taper down from decadron 2 mg bid to once daily.    5. Hypothyroidism: Levothyroxine per PCP.   6. Hypertension: Hold in the setting of lower BPs.  7. Diabetes mellitus, type II: SSI.   8. Hyperlipidemia: Pravastatin per PCP.  9.History of atrial fibrillation: Diltiazem, pacemaker. Coumadin being managed per her PCP previously.   10. COPD/ Chronic Bronchitis exacerbation.  --Patient advised to continue albuterol q 6 hours as needed.  11. S/p multiple ICU admission due to urosepsis.  12.  Thrombocytopenia.  --Likely secondary to #11 versus bone marrow involvement with lymphoma.  13. Poor nutrition, Hypalbuminemia. --Albumin is 2.5 today.  It was 1.9 on discharge.  14. Follow-up as needed.  A referral to hospice has been made.   All questions were answered. The patient knows to call the clinic with any problems, questions or concerns. We can certainly see the patient much sooner if necessary.  I spent 25 minutes counseling the patient face to face. The total time spent in the appointment was 40 minutes.    , , MD 11/02/2013 8:23 AM

## 2013-11-22 ENCOUNTER — Ambulatory Visit: Payer: Medicare Other | Admitting: Endocrinology

## 2013-11-22 DIAGNOSIS — Z0289 Encounter for other administrative examinations: Secondary | ICD-10-CM

## 2013-12-12 DEATH — deceased

## 2014-01-11 ENCOUNTER — Encounter: Payer: Self-pay | Admitting: Internal Medicine

## 2014-01-11 ENCOUNTER — Telehealth: Payer: Self-pay | Admitting: Internal Medicine

## 2014-01-11 NOTE — Telephone Encounter (Signed)
01-11-14 past due letter, need pacer check with [name], last ck 08-16-11/mt

## 2014-02-13 ENCOUNTER — Encounter: Payer: Self-pay | Admitting: Emergency Medicine

## 2014-03-14 ENCOUNTER — Encounter: Payer: Self-pay | Admitting: Pharmacist

## 2014-03-14 NOTE — Progress Notes (Signed)
I called [facility] to enquire about pts status since we have not seen pt in our Coumadin clinic since July 2015.  RN there reported that the pt passed away on 12-22-13. Kennith Center, Pharm.D., CPP 03/14/2014@11 :19 AM

## 2014-04-26 ENCOUNTER — Encounter (HOSPITAL_COMMUNITY): Payer: Self-pay | Admitting: Urology

## 2014-10-08 ENCOUNTER — Other Ambulatory Visit: Payer: Self-pay

## 2014-11-08 ENCOUNTER — Ambulatory Visit: Payer: Self-pay | Admitting: Internal Medicine

## 2014-11-08 DIAGNOSIS — IMO0001 Reserved for inherently not codable concepts without codable children: Secondary | ICD-10-CM

## 2014-11-08 NOTE — Progress Notes (Signed)
Patient ID: [name], female   DOB: [birth year], 74 y.o.   MRN: [id number]  E R R O R

## 2015-06-28 ENCOUNTER — Other Ambulatory Visit: Payer: Self-pay | Admitting: Nurse Practitioner

## 2016-03-11 IMAGING — CR DG CHEST 1V PORT
1 series · 1 of 1 positions shown · non-contrast
Comparison: Chest radiograph 06/26/2013 and CT chest abdomen pelvis
06/05/2013

CLINICAL DATA: Nausea, emesis, diarrhea.  History of COPD.

EXAM:
PORTABLE CHEST - 1 VIEW

[AP]
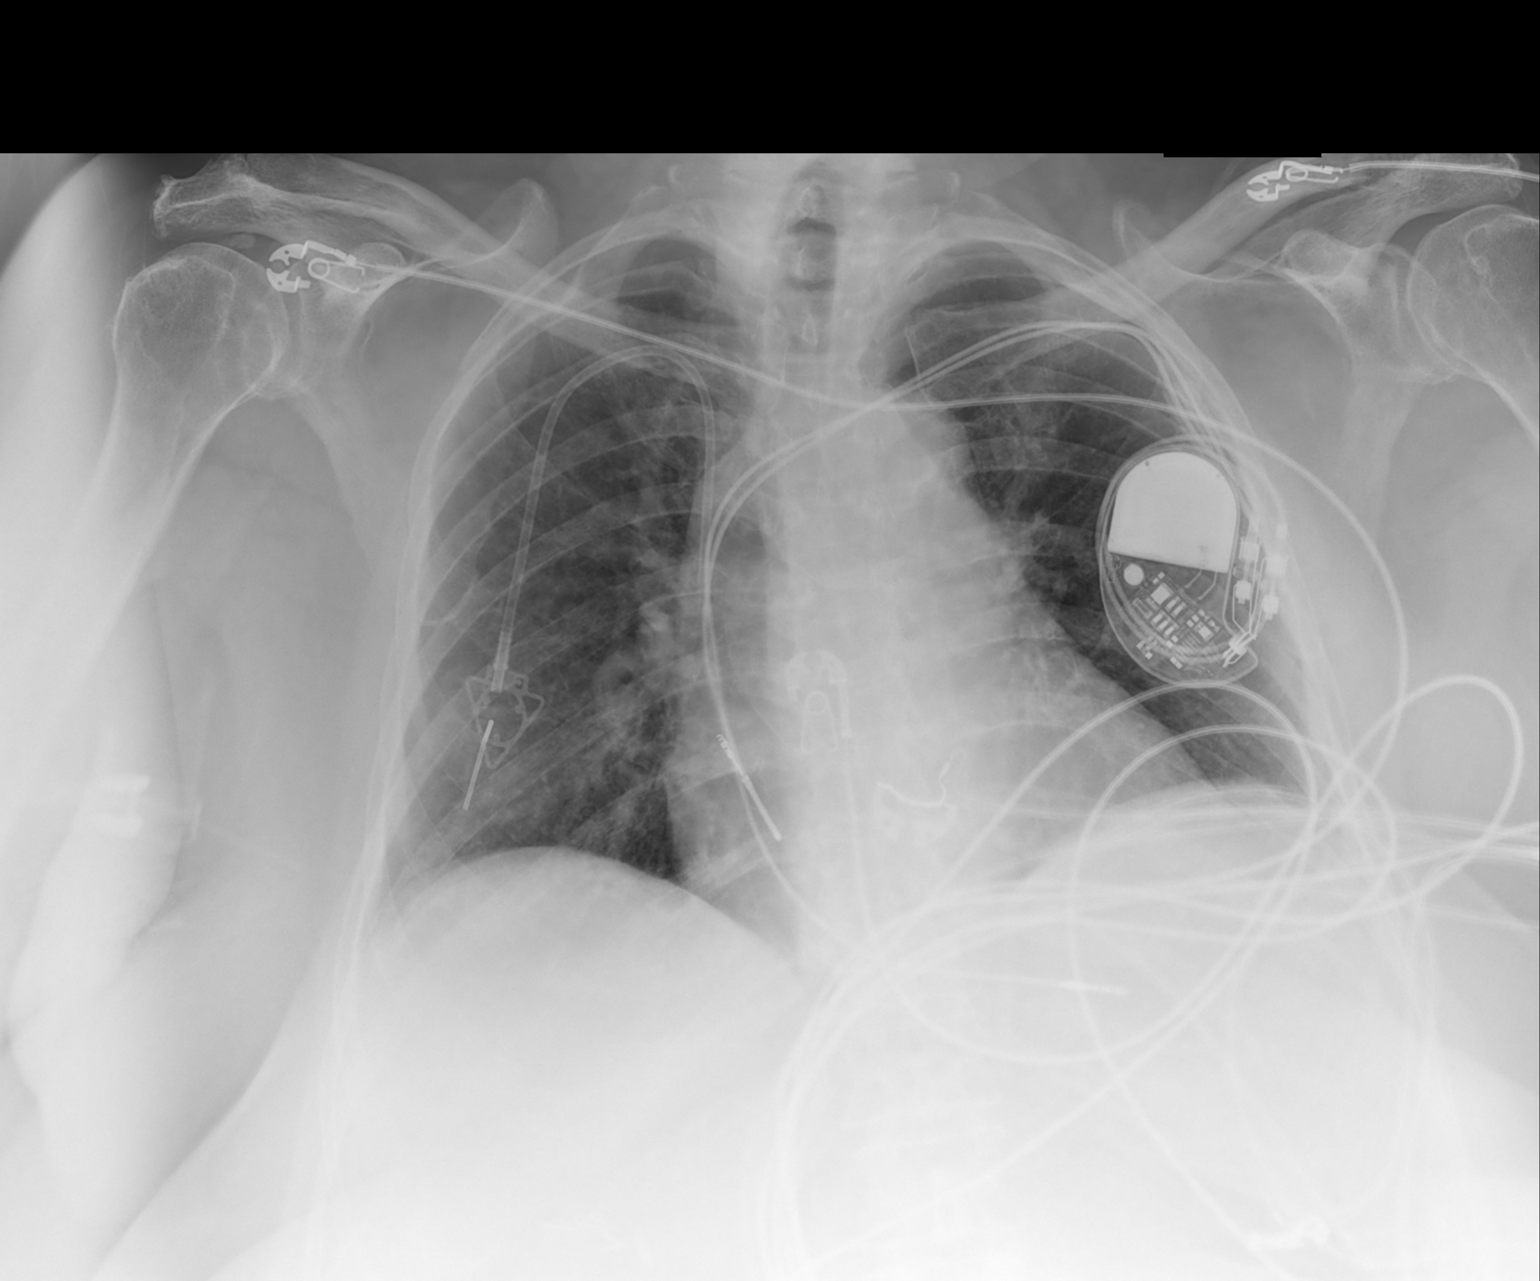

[1 of 1 positions shown; findings below may reference images not displayed]

FINDINGS: Right subclavian power port with the distal tip projecting over the
superior vena cava. Left chest wall dual lead pacemaker appears
stable. Cardiac leads project over the chest. Cardiomegaly appears
similar to recent prior chest radiograph. Pulmonary vascularity is
upper normal. The lungs appear clear. No airspace disease, effusion,
or pneumothorax identified.
IMPRESSION: Mild cardiomegaly. Pulmonary vascularity is upper normal and the
lungs appear clear.

## 2016-03-11 IMAGING — US US RENAL
1 series · 14 of 25 positions shown · non-contrast
Comparison: CT 06/05/2013.

CLINICAL DATA: Flank pain.

EXAM:
RENAL/URINARY TRACT ULTRASOUND COMPLETE

[Series 1: us renal · 0.21mm/px · 14 of 28 slices shown]
[im 1/28]
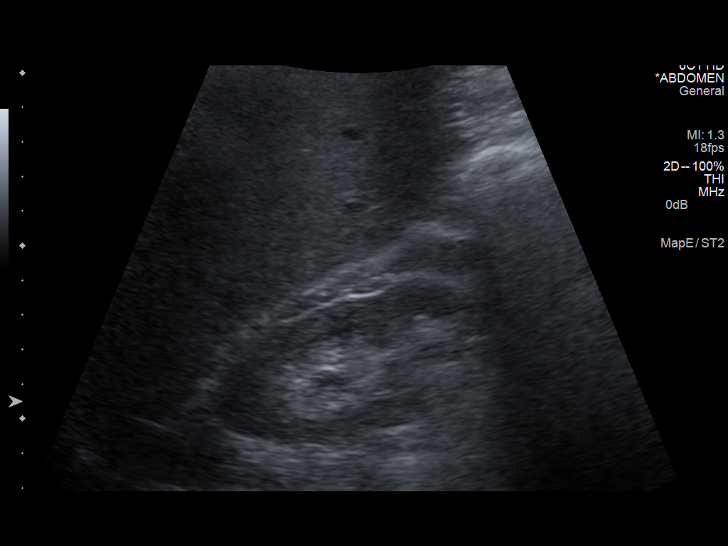
[im 3/28]
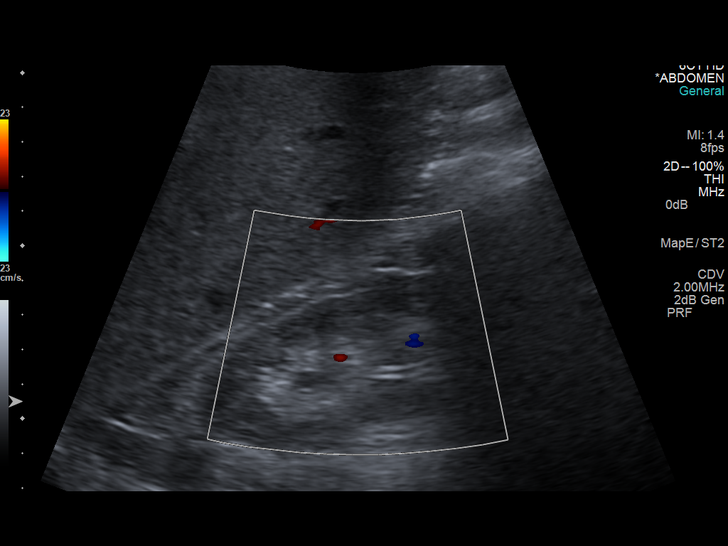
[im 5/28]
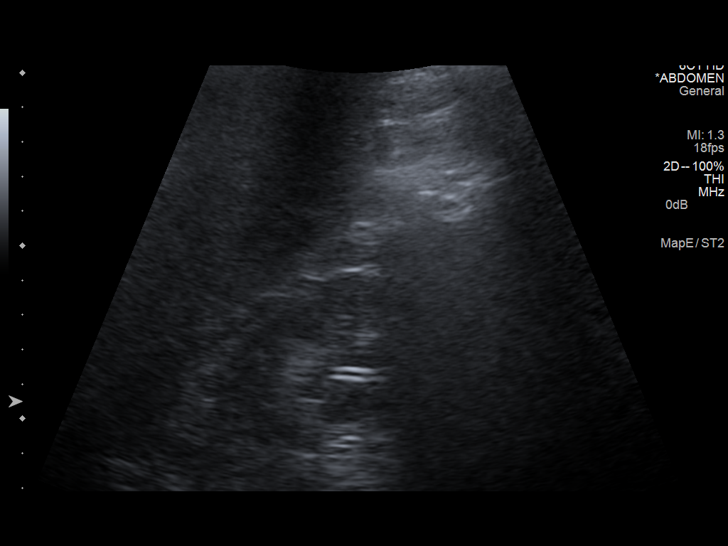
[im 7/28]
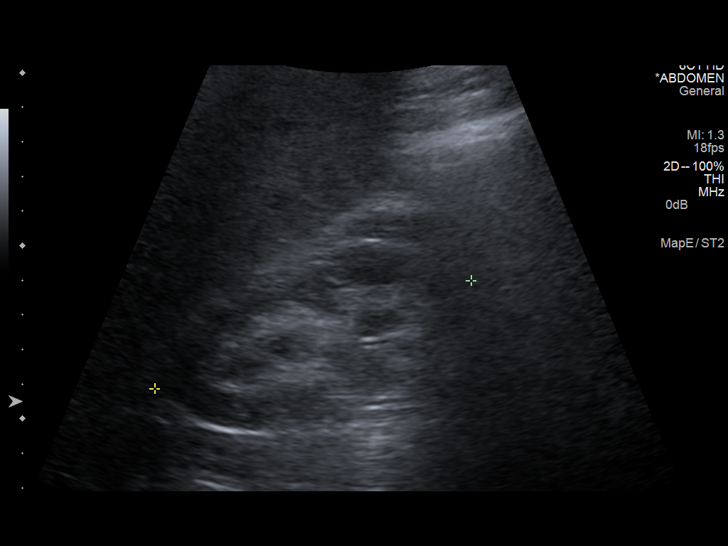
[im 10/28]
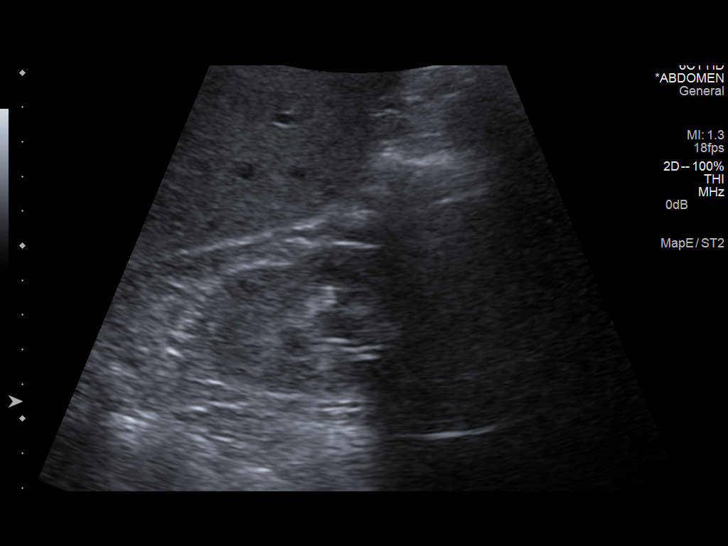
[im 11/28]
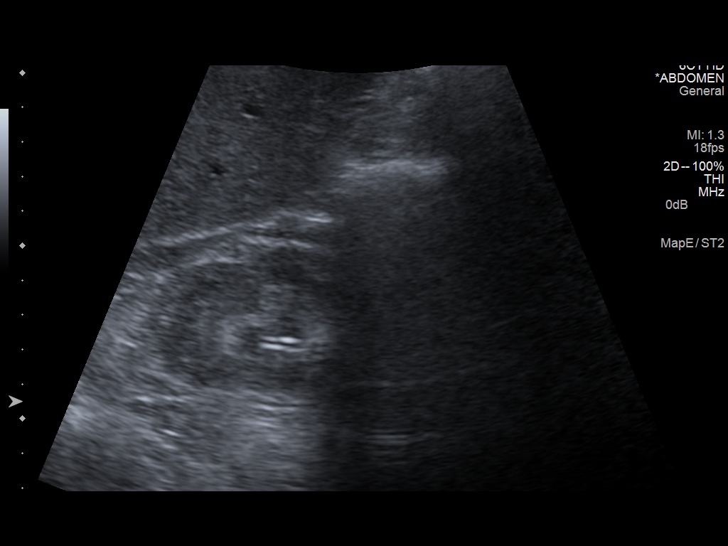
[im 13/28]
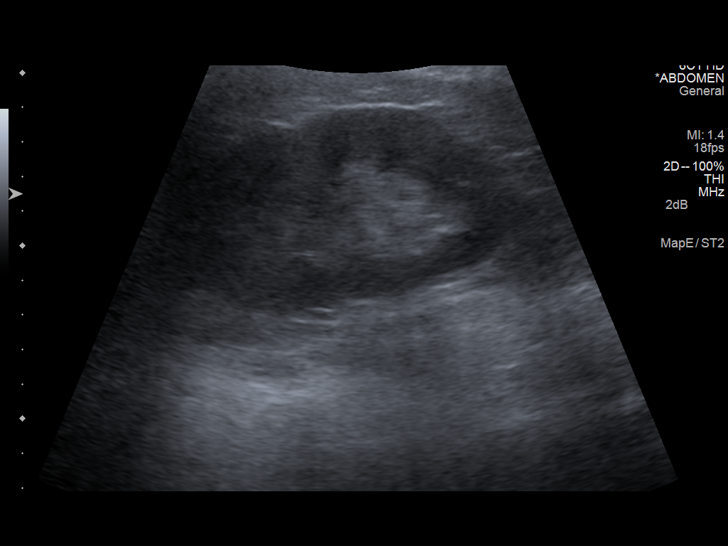
[im 15/28]
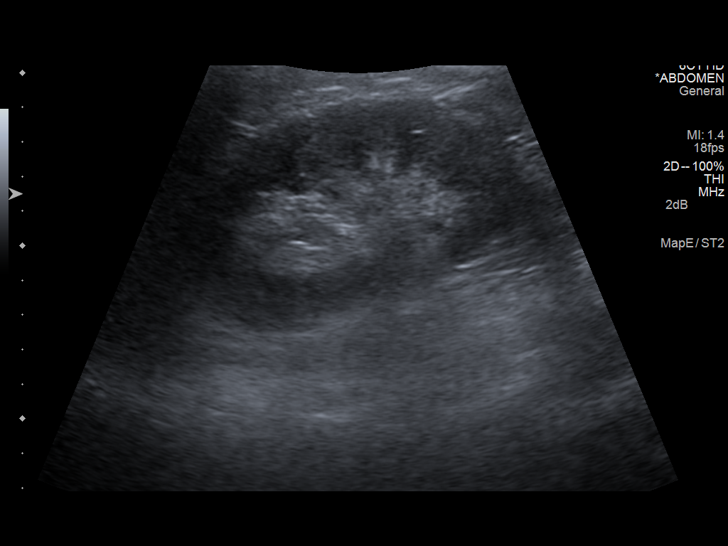
[im 17/28]
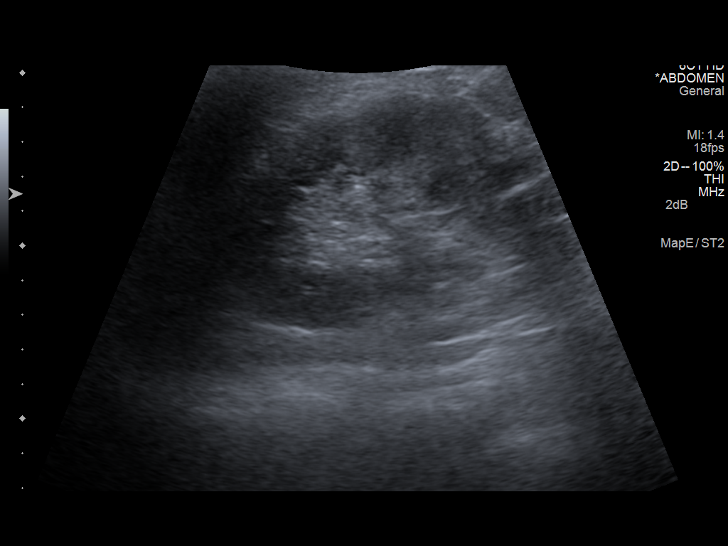
[im 19/28]
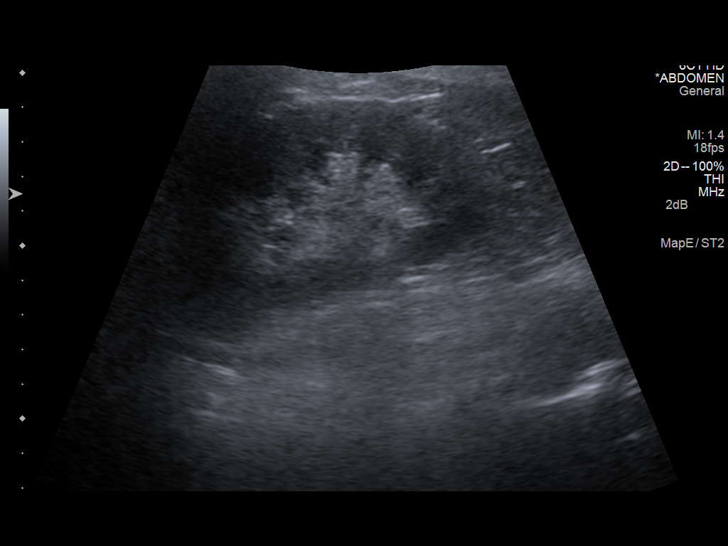
[im 21/28]
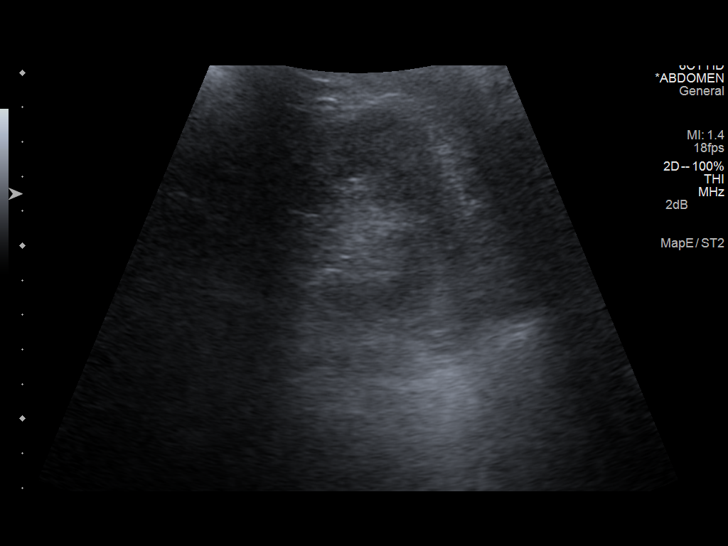
[im 23/28]
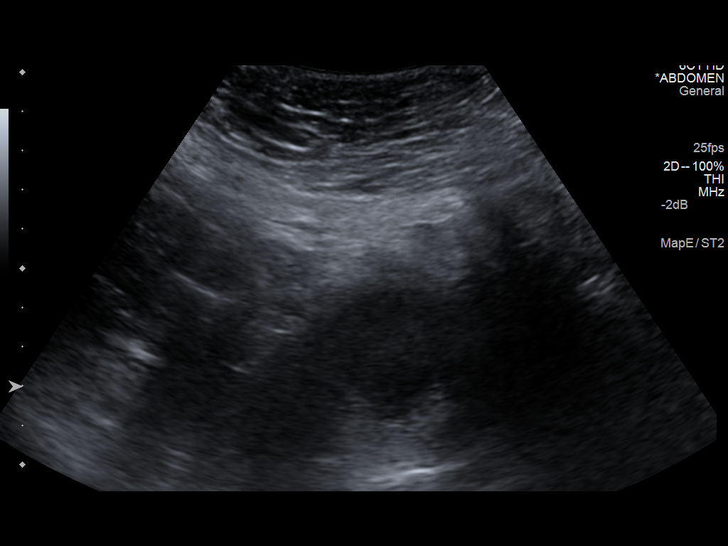
[im 25/28]
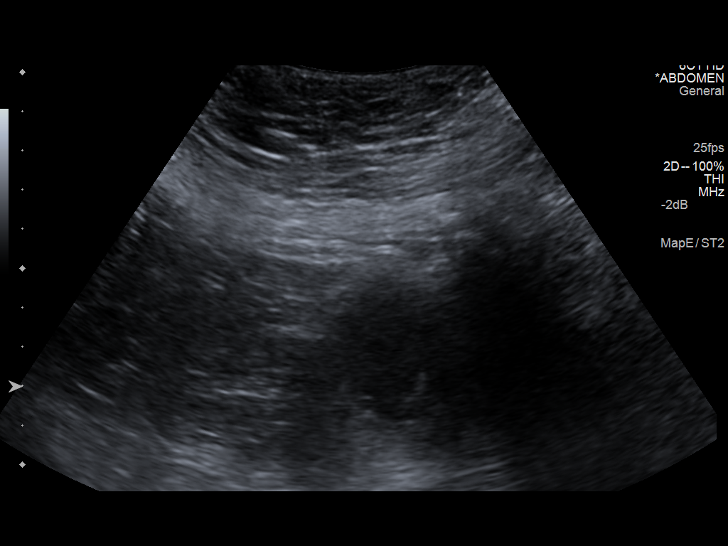
[im 28/28]
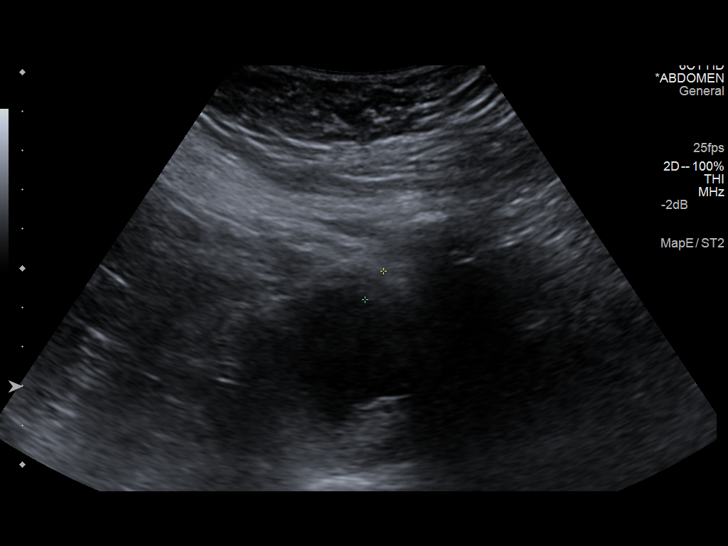

[14 of 25 positions shown; findings below may reference images not displayed]

FINDINGS: Right Kidney:

Length: 9.7 cm. Ureteral stent noted.. Echogenicity within normal
limits. No mass or hydronephrosis visualized.

Left Kidney:

Length: 9.9 cm.. Echogenicity within normal limits. No mass or
hydronephrosis visualized.

Bladder:

Bladder is nondistended. Bladder wall is thickened to approximately
9 mm.
IMPRESSION: 1. Right ureteral stent noted.  No hydronephrosis.
2. Bladder nondistended.  Bladder wall thickening is present.

## 2016-03-16 IMAGING — CR DG CHEST 1V PORT
1 series · 1 of 1 positions shown · non-contrast
Comparison: [REDACTED] [id number].

CLINICAL DATA: Cough.

EXAM:
PORTABLE CHEST - 1 VIEW

[AP]
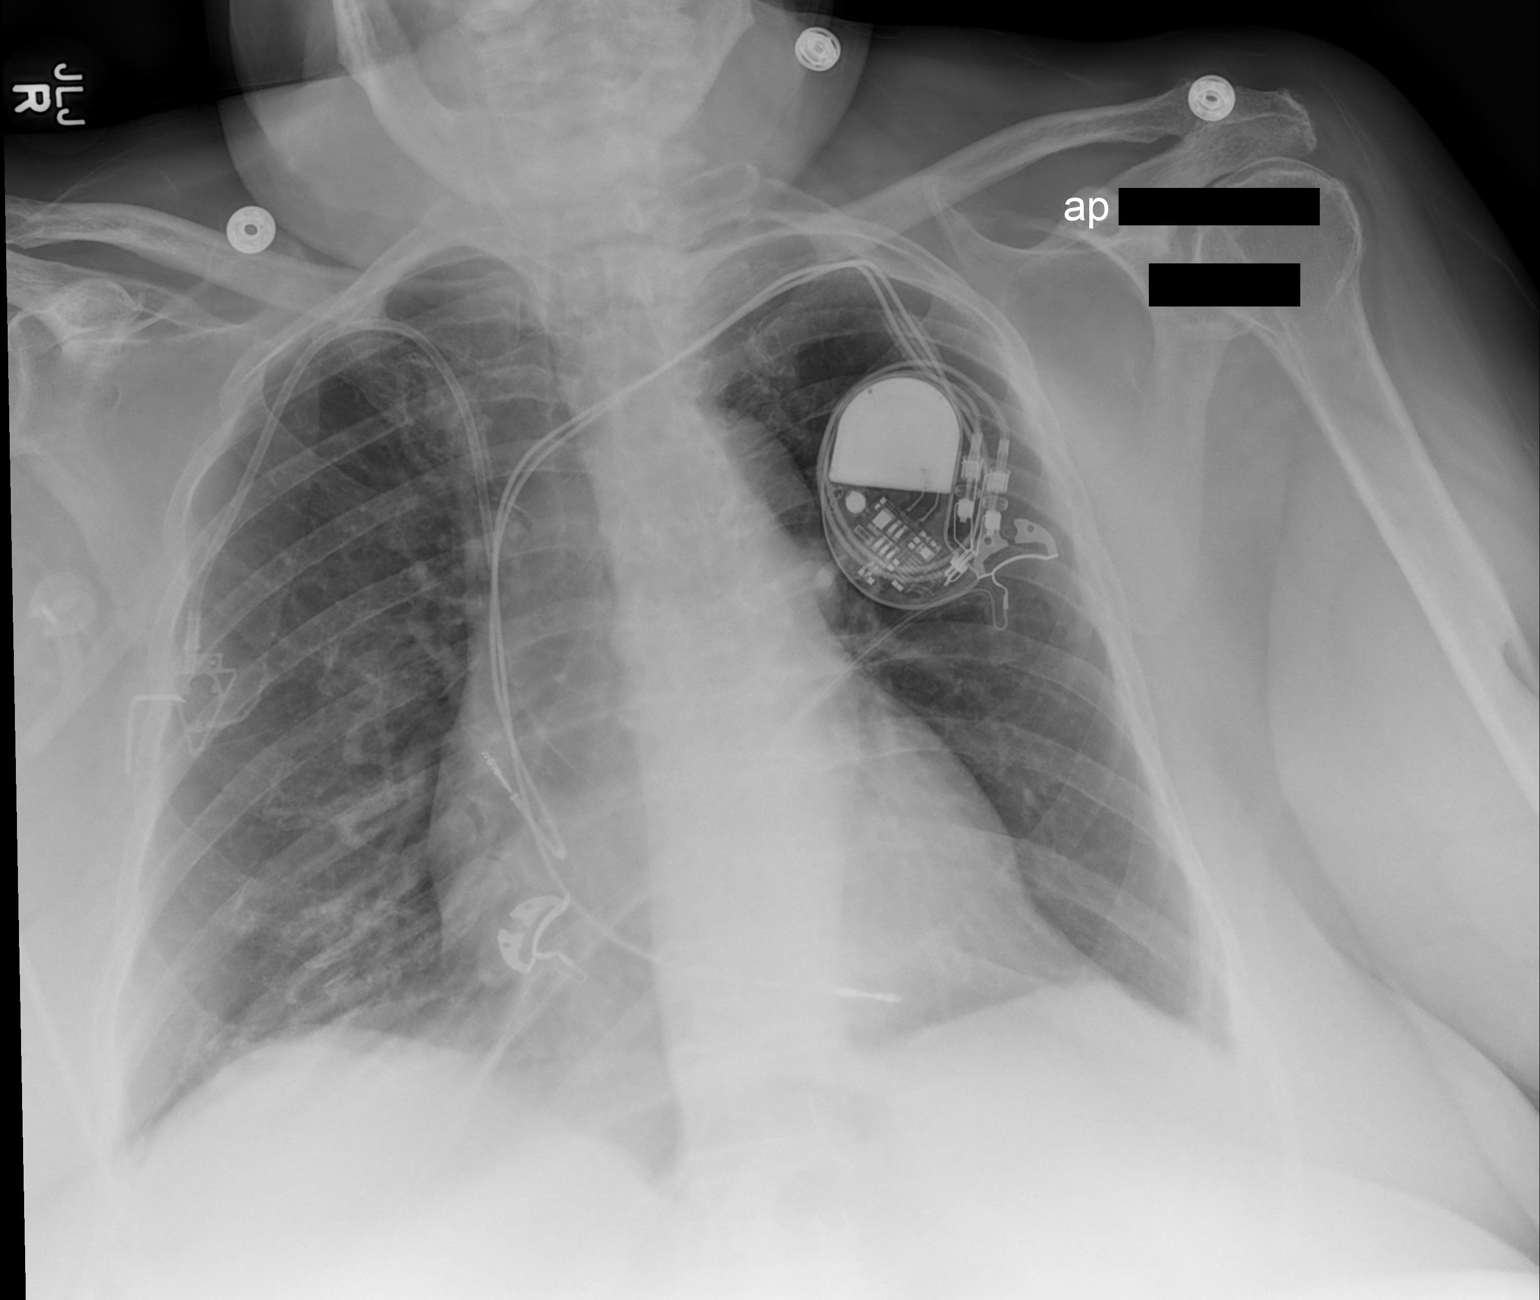

[1 of 1 positions shown; findings below may reference images not displayed]

FINDINGS: Stable mild cardiomegaly. Left-sided pacemaker in right subclavian
Port-A-Cath are unchanged in position. Left lung is clear. Increased
right lower lobe opacity is noted concerning for worsening
subsegmental atelectasis or possibly pneumonia. Bony thorax appears
intact. No pneumothorax or significant pleural effusion is noted.
IMPRESSION: Increased right lower lobe opacity is noted concerning for
subsegmental atelectasis or pneumonia.

## 2016-03-19 IMAGING — CR DG CHEST 2V
3 series · 3 of 3 positions shown · non-contrast
Comparison: 09/04/2013

CLINICAL DATA: Cough

EXAM:
CHEST  2 VIEW

[w chest lat (1 of 2)]
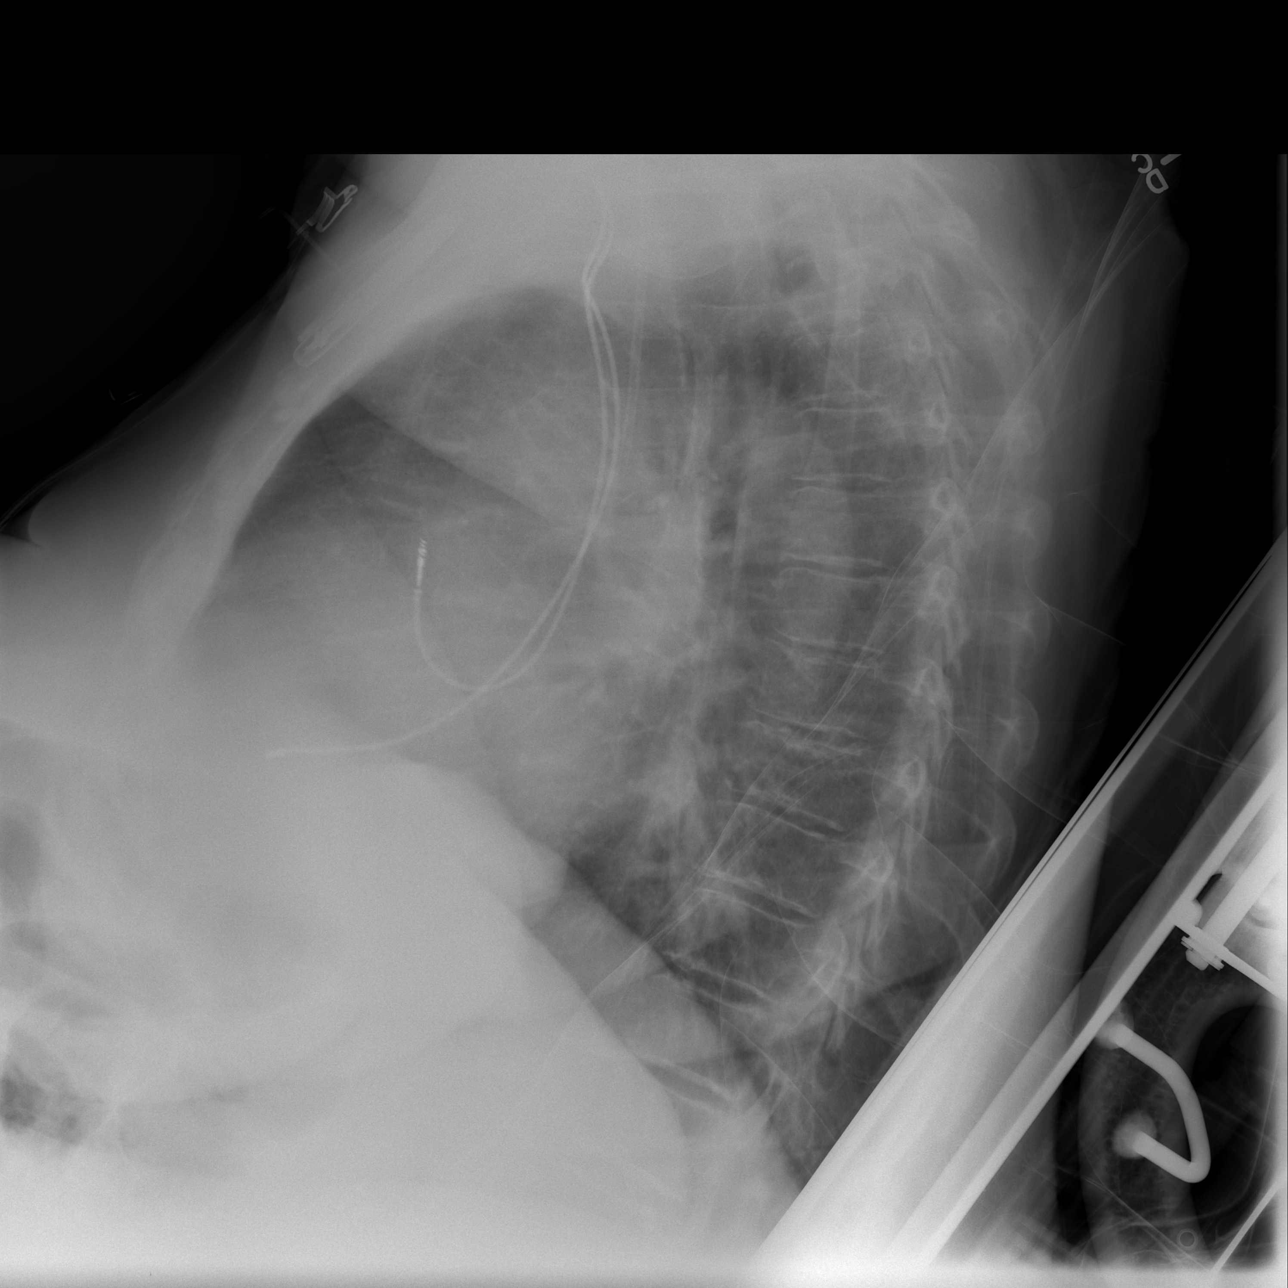

[w chest lat (2 of 2)]
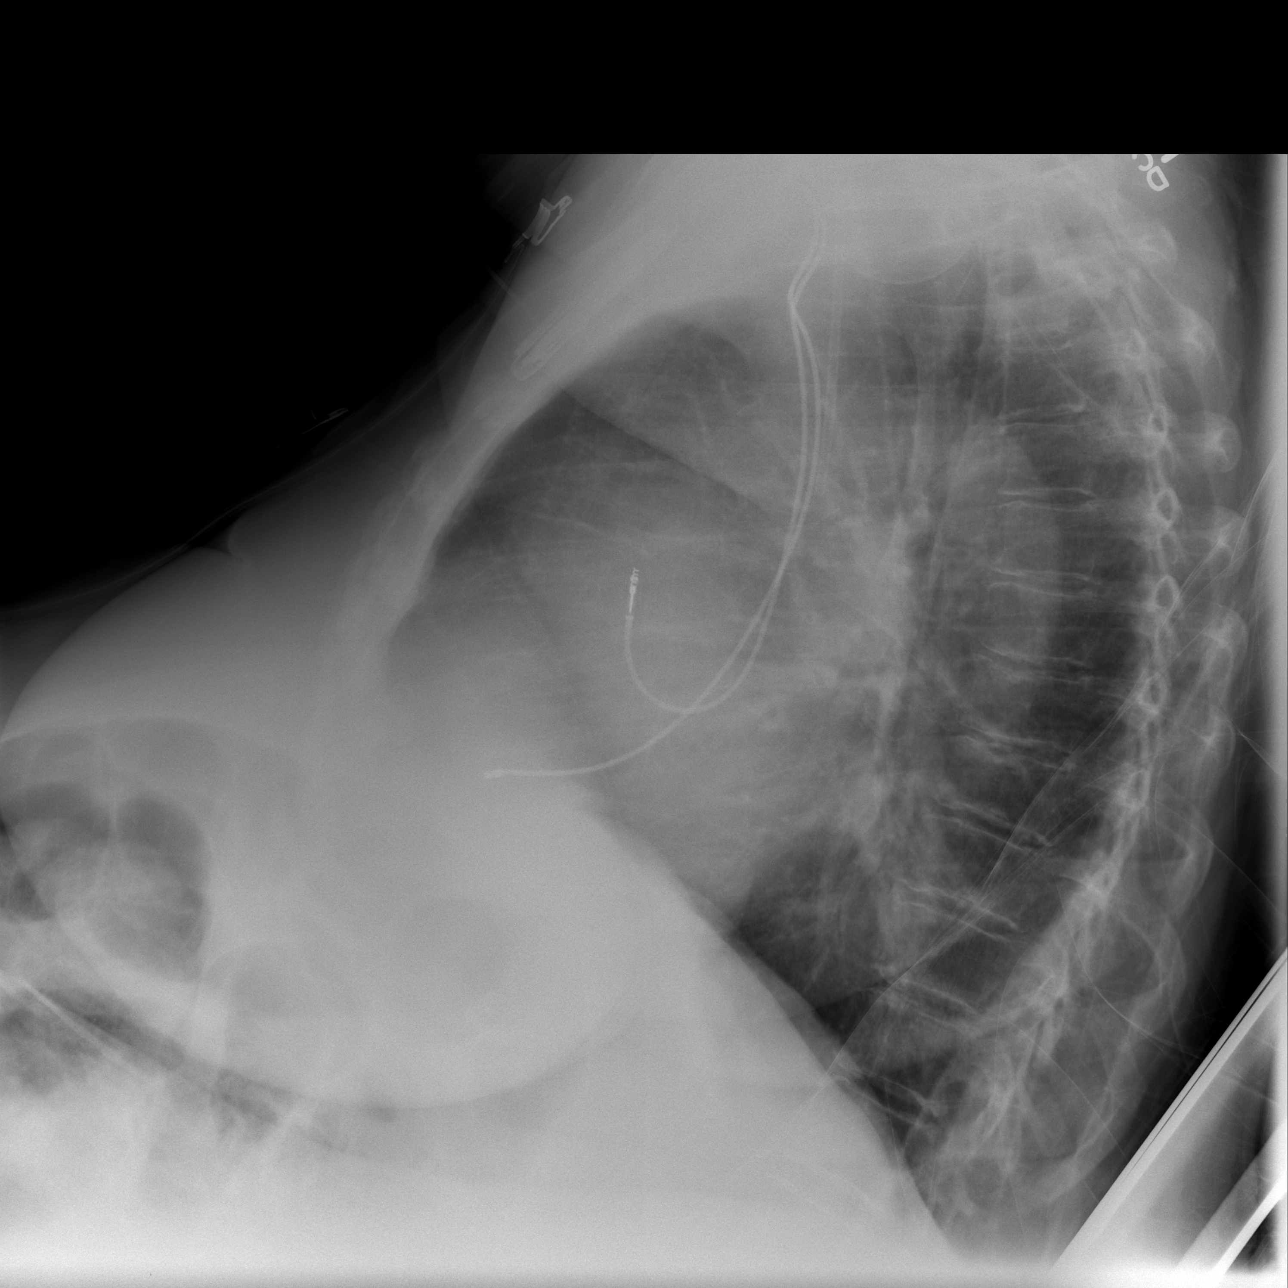

[view not recorded]
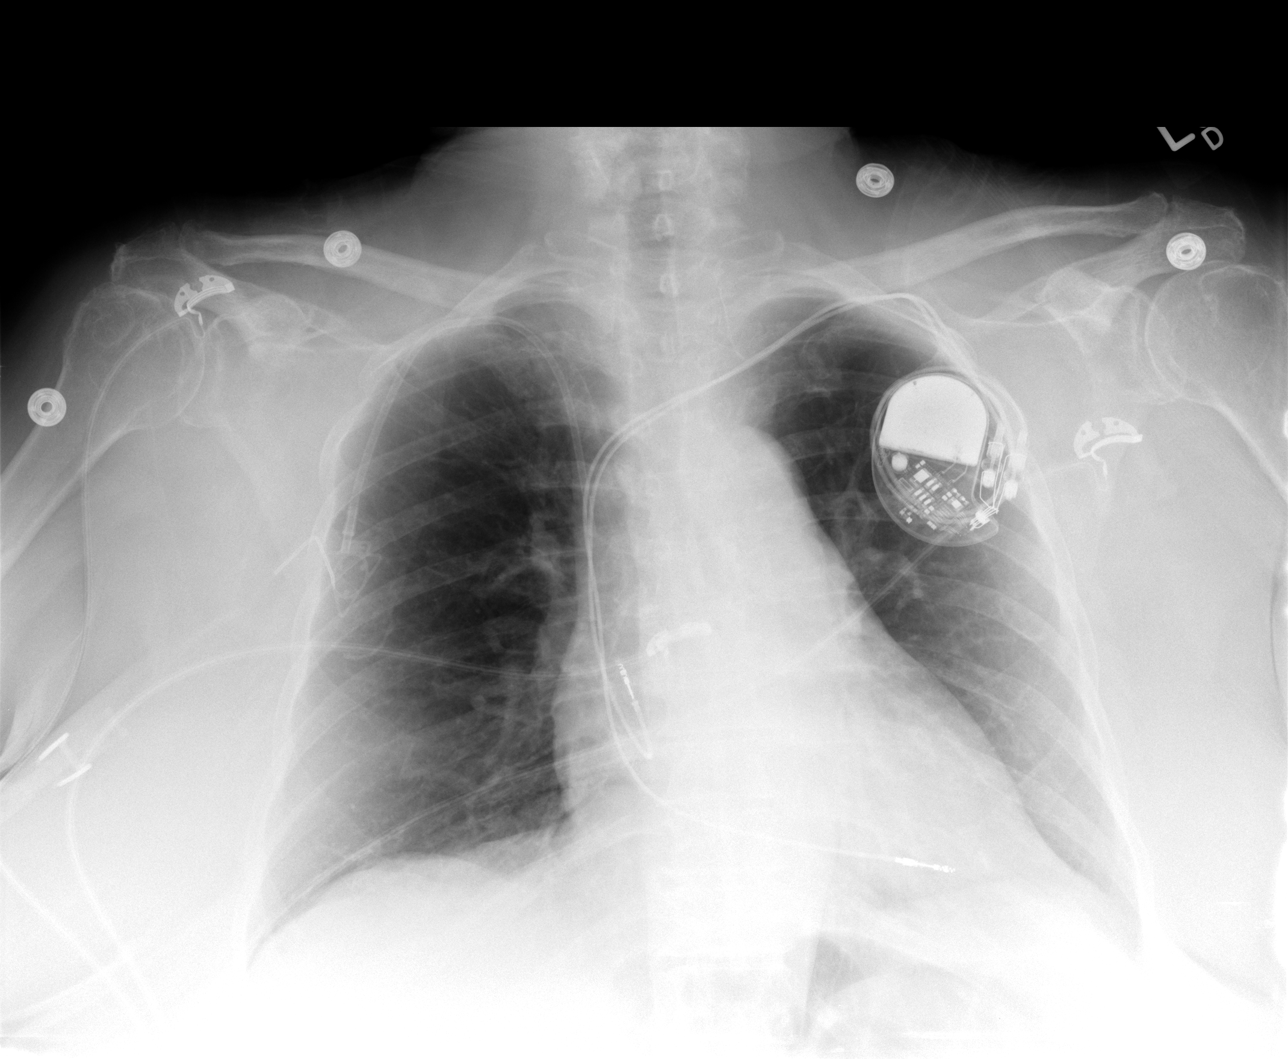

[3 of 3 positions shown; findings below may reference images not displayed]

FINDINGS: A right-sided chest wall port and left-sided pacemaker are again
seen and stable. The cardiac shadow is stable. The lungs are well
aerated bilaterally without focal infiltrate or sizable effusion. No
acute bony abnormality is seen.
IMPRESSION: No active cardiopulmonary disease.
